# Patient Record
Sex: Male | Born: 1940 | Race: White | Hispanic: No | Smoking: Former smoker
Health system: Southern US, Community
[De-identification: ages and names within clinical notes are randomized; demographics above are authoritative.]

## PROBLEM LIST (undated history)

## (undated) DIAGNOSIS — F329 Major depressive disorder, single episode, unspecified: Secondary | ICD-10-CM

## (undated) DIAGNOSIS — F410 Panic disorder [episodic paroxysmal anxiety] without agoraphobia: Secondary | ICD-10-CM

## (undated) DIAGNOSIS — I4891 Unspecified atrial fibrillation: Secondary | ICD-10-CM

## (undated) DIAGNOSIS — E785 Hyperlipidemia, unspecified: Secondary | ICD-10-CM

## (undated) DIAGNOSIS — K219 Gastro-esophageal reflux disease without esophagitis: Secondary | ICD-10-CM

## (undated) DIAGNOSIS — I5022 Chronic systolic (congestive) heart failure: Secondary | ICD-10-CM

## (undated) DIAGNOSIS — R972 Elevated prostate specific antigen [PSA]: Secondary | ICD-10-CM

## (undated) DIAGNOSIS — C801 Malignant (primary) neoplasm, unspecified: Secondary | ICD-10-CM

## (undated) DIAGNOSIS — I2510 Atherosclerotic heart disease of native coronary artery without angina pectoris: Secondary | ICD-10-CM

## (undated) DIAGNOSIS — C4490 Unspecified malignant neoplasm of skin, unspecified: Secondary | ICD-10-CM

## (undated) DIAGNOSIS — N400 Enlarged prostate without lower urinary tract symptoms: Secondary | ICD-10-CM

## (undated) DIAGNOSIS — I4892 Unspecified atrial flutter: Secondary | ICD-10-CM

## (undated) DIAGNOSIS — I442 Atrioventricular block, complete: Secondary | ICD-10-CM

## (undated) DIAGNOSIS — C679 Malignant neoplasm of bladder, unspecified: Secondary | ICD-10-CM

## (undated) DIAGNOSIS — M797 Fibromyalgia: Secondary | ICD-10-CM

## (undated) DIAGNOSIS — Z950 Presence of cardiac pacemaker: Secondary | ICD-10-CM

## (undated) DIAGNOSIS — K449 Diaphragmatic hernia without obstruction or gangrene: Secondary | ICD-10-CM

## (undated) DIAGNOSIS — F32A Depression, unspecified: Secondary | ICD-10-CM

## (undated) DIAGNOSIS — N281 Cyst of kidney, acquired: Secondary | ICD-10-CM

## (undated) DIAGNOSIS — I10 Essential (primary) hypertension: Secondary | ICD-10-CM

## (undated) DIAGNOSIS — I255 Ischemic cardiomyopathy: Secondary | ICD-10-CM

## (undated) DIAGNOSIS — E871 Hypo-osmolality and hyponatremia: Secondary | ICD-10-CM

## (undated) DIAGNOSIS — F419 Anxiety disorder, unspecified: Secondary | ICD-10-CM

## (undated) DIAGNOSIS — N3289 Other specified disorders of bladder: Secondary | ICD-10-CM

## (undated) HISTORY — DX: Hyperlipidemia, unspecified: E78.5

## (undated) HISTORY — DX: Fibromyalgia: M79.7

## (undated) HISTORY — DX: Unspecified atrial flutter: I48.92

## (undated) HISTORY — DX: Gastro-esophageal reflux disease without esophagitis: K21.9

## (undated) HISTORY — DX: Atrioventricular block, complete: I44.2

## (undated) HISTORY — DX: Malignant neoplasm of bladder, unspecified: C67.9

## (undated) HISTORY — PX: EXCISIONAL HEMORRHOIDECTOMY: SHX1541

## (undated) HISTORY — DX: Atherosclerotic heart disease of native coronary artery without angina pectoris: I25.10

## (undated) HISTORY — PX: SKIN CANCER EXCISION: SHX779

## (undated) HISTORY — DX: Essential (primary) hypertension: I10

## (undated) HISTORY — DX: Malignant (primary) neoplasm, unspecified: C80.1

## (undated) HISTORY — DX: Ischemic cardiomyopathy: I25.5

## (undated) HISTORY — PX: CARDIAC CATHETERIZATION: SHX172

## (undated) HISTORY — DX: Anxiety disorder, unspecified: F41.9

---
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
## 1989-08-27 HISTORY — PX: CORONARY ARTERY BYPASS GRAFT: SHX141

## 1997-10-09 ENCOUNTER — Other Ambulatory Visit: Admission: RE | Admit: 1997-10-09 | Discharge: 1997-10-09 | Payer: Self-pay | Admitting: Cardiology

## 2000-01-10 ENCOUNTER — Encounter: Payer: Self-pay | Admitting: Emergency Medicine

## 2000-01-10 ENCOUNTER — Inpatient Hospital Stay (HOSPITAL_COMMUNITY): Admission: EM | Admit: 2000-01-10 | Discharge: 2000-01-13 | Payer: Self-pay

## 2001-05-30 ENCOUNTER — Encounter: Payer: Self-pay | Admitting: Cardiology

## 2001-05-30 ENCOUNTER — Encounter: Admission: RE | Admit: 2001-05-30 | Discharge: 2001-05-30 | Payer: Self-pay | Admitting: Cardiology

## 2002-02-11 ENCOUNTER — Ambulatory Visit (HOSPITAL_COMMUNITY): Admission: RE | Admit: 2002-02-11 | Discharge: 2002-02-11 | Payer: Self-pay | Admitting: Cardiology

## 2004-03-03 ENCOUNTER — Observation Stay (HOSPITAL_COMMUNITY): Admission: EM | Admit: 2004-03-03 | Discharge: 2004-03-04 | Payer: Self-pay

## 2005-05-02 HISTORY — PX: CHOLECYSTECTOMY: SHX55

## 2005-06-02 HISTORY — PX: CORONARY ARTERY BYPASS GRAFT: SHX141

## 2005-06-15 ENCOUNTER — Inpatient Hospital Stay (HOSPITAL_COMMUNITY): Admission: EM | Admit: 2005-06-15 | Discharge: 2005-06-27 | Payer: Self-pay | Admitting: Emergency Medicine

## 2005-07-02 ENCOUNTER — Emergency Department (HOSPITAL_COMMUNITY): Admission: EM | Admit: 2005-07-02 | Discharge: 2005-07-02 | Payer: Self-pay | Admitting: Emergency Medicine

## 2006-01-19 ENCOUNTER — Ambulatory Visit (HOSPITAL_COMMUNITY): Admission: RE | Admit: 2006-01-19 | Discharge: 2006-01-20 | Payer: Self-pay | Admitting: General Surgery

## 2006-01-19 ENCOUNTER — Encounter (INDEPENDENT_AMBULATORY_CARE_PROVIDER_SITE_OTHER): Payer: Self-pay | Admitting: *Deleted

## 2006-03-20 ENCOUNTER — Inpatient Hospital Stay (HOSPITAL_COMMUNITY): Admission: EM | Admit: 2006-03-20 | Discharge: 2006-03-22 | Payer: Self-pay | Admitting: Emergency Medicine

## 2006-04-04 ENCOUNTER — Inpatient Hospital Stay (HOSPITAL_BASED_OUTPATIENT_CLINIC_OR_DEPARTMENT_OTHER): Admission: RE | Admit: 2006-04-04 | Discharge: 2006-04-04 | Payer: Self-pay | Admitting: Cardiology

## 2007-03-20 ENCOUNTER — Observation Stay (HOSPITAL_COMMUNITY): Admission: EM | Admit: 2007-03-20 | Discharge: 2007-03-22 | Payer: Self-pay | Admitting: *Deleted

## 2007-03-20 ENCOUNTER — Ambulatory Visit: Payer: Self-pay | Admitting: *Deleted

## 2007-07-31 ENCOUNTER — Emergency Department (HOSPITAL_COMMUNITY): Admission: EM | Admit: 2007-07-31 | Discharge: 2007-07-31 | Payer: Self-pay | Admitting: Emergency Medicine

## 2009-10-29 ENCOUNTER — Inpatient Hospital Stay (HOSPITAL_COMMUNITY): Admission: EM | Admit: 2009-10-29 | Discharge: 2009-10-31 | Payer: Self-pay | Admitting: Emergency Medicine

## 2009-10-29 ENCOUNTER — Ambulatory Visit: Payer: Self-pay | Admitting: Internal Medicine

## 2009-10-30 HISTORY — PX: CARDIAC CATHETERIZATION: SHX172

## 2010-01-28 ENCOUNTER — Ambulatory Visit: Payer: Self-pay | Admitting: Cardiology

## 2010-05-02 HISTORY — PX: CATARACT EXTRACTION W/ INTRAOCULAR LENS  IMPLANT, BILATERAL: SHX1307

## 2010-05-02 DEATH — deceased

## 2010-05-26 ENCOUNTER — Ambulatory Visit: Payer: Self-pay | Admitting: Cardiology

## 2010-06-21 ENCOUNTER — Observation Stay (HOSPITAL_COMMUNITY)
Admission: EM | Admit: 2010-06-21 | Discharge: 2010-06-23 | DRG: 204 | Disposition: A | Discharge status: Home / Self Care | Payer: Medicare Other | Attending: Cardiovascular Disease | Admitting: Cardiovascular Disease

## 2010-06-21 ENCOUNTER — Emergency Department (HOSPITAL_COMMUNITY): Payer: Medicare Other

## 2010-06-21 DIAGNOSIS — F411 Generalized anxiety disorder: Secondary | ICD-10-CM | POA: Insufficient documentation

## 2010-06-21 DIAGNOSIS — R5381 Other malaise: Secondary | ICD-10-CM | POA: Insufficient documentation

## 2010-06-21 DIAGNOSIS — R079 Chest pain, unspecified: Secondary | ICD-10-CM

## 2010-06-21 DIAGNOSIS — K219 Gastro-esophageal reflux disease without esophagitis: Secondary | ICD-10-CM | POA: Insufficient documentation

## 2010-06-21 DIAGNOSIS — I2510 Atherosclerotic heart disease of native coronary artery without angina pectoris: Secondary | ICD-10-CM | POA: Insufficient documentation

## 2010-06-21 DIAGNOSIS — I10 Essential (primary) hypertension: Secondary | ICD-10-CM | POA: Insufficient documentation

## 2010-06-21 DIAGNOSIS — Z951 Presence of aortocoronary bypass graft: Secondary | ICD-10-CM | POA: Insufficient documentation

## 2010-06-21 DIAGNOSIS — IMO0001 Reserved for inherently not codable concepts without codable children: Secondary | ICD-10-CM | POA: Insufficient documentation

## 2010-06-21 DIAGNOSIS — Z23 Encounter for immunization: Secondary | ICD-10-CM | POA: Insufficient documentation

## 2010-06-21 DIAGNOSIS — E785 Hyperlipidemia, unspecified: Secondary | ICD-10-CM | POA: Insufficient documentation

## 2010-06-21 DIAGNOSIS — I2589 Other forms of chronic ischemic heart disease: Secondary | ICD-10-CM | POA: Insufficient documentation

## 2010-06-21 LAB — CBC
HCT: 39.9 % (ref 39.0–52.0)
Hemoglobin: 13.4 g/dL (ref 13.0–17.0)
MCH: 30.7 pg (ref 26.0–34.0)
MCV: 91.5 fL (ref 78.0–100.0)
RBC: 4.36 MIL/uL (ref 4.22–5.81)
WBC: 8 10*3/uL (ref 4.0–10.5)

## 2010-06-21 LAB — COMPREHENSIVE METABOLIC PANEL
AST: 17 U/L (ref 0–37)
BUN: 10 mg/dL (ref 6–23)
CO2: 29 mEq/L (ref 19–32)
Chloride: 97 mEq/L (ref 96–112)
GFR calc Af Amer: >60 mL/min (ref >60)
Potassium: 5 mEq/L (ref 3.5–5.1)
Sodium: 133 mEq/L — ABNORMAL LOW (ref 135–145)

## 2010-06-21 LAB — CK TOTAL AND CKMB (NOT AT ARMC)
CK, MB: 1.8 ng/mL (ref 0.3–4.0)
Relative Index: INVALID (ref 0.0–2.5)

## 2010-06-21 LAB — POCT CARDIAC MARKERS: Myoglobin, poc: 77 ng/mL (ref 12–200)

## 2010-06-22 LAB — CARDIAC PANEL(CRET KIN+CKTOT+MB+TROPI)
CK, MB: 1.7 ng/mL (ref 0.3–4.0)
Relative Index: INVALID (ref 0.0–2.5)
Total CK: 46 U/L (ref 7–232)

## 2010-06-23 LAB — CBC
HCT: 37.8 % — ABNORMAL LOW (ref 39.0–52.0)
Hemoglobin: 12.6 g/dL — ABNORMAL LOW (ref 13.0–17.0)
MCH: 30.7 pg (ref 26.0–34.0)
MCHC: 33.3 g/dL (ref 30.0–36.0)
RBC: 4.1 MIL/uL — ABNORMAL LOW (ref 4.22–5.81)
RDW: 12.6 % (ref 11.5–15.5)
WBC: 6.2 10*3/uL (ref 4.0–10.5)

## 2010-06-30 NOTE — Discharge Summary (Addendum)
NAME:  [name], [name] NO.:  [id number]  MEDICAL RECORD NO.:  [id number]           PATIENT TYPE:  I  LOCATION:  2034                         FACILITY:  [facility]  PHYSICIAN:  Cassell Clement, M.D. DATE OF BIRTH:  1940/08/10  DATE OF ADMISSION:  06/21/2010 DATE OF DISCHARGE:  06/23/2010                              DISCHARGE SUMMARY   DISCHARGE DIAGNOSES: 1. Chest pain.     a.     Cardiac enzymes negative x4.     b.     Imdur increased to 60 mg daily. 2. Coronary artery disease.     a.     Status post coronary artery bypass graft in 1991 with left      internal mammary artery to the left anterior descending, saphenous      vein graft to the obtuse marginal and circumflex, saphenous vein      graft to the posterior descending artery and posterolateral      artery.     b.     Status post redo coronary artery bypass graft x3 with      saphenous vein graft to the obtuse marginal and sequential      saphenous vein graft to posterior descending artery and      posterolateral artery in February 2007.     c.     Last catheterization July 2011 showing left anterior      descending, circumflex, and right coronary artery totalled with      patent grafts.     d.     Ejection fraction of 45% by catheterization. 3. Hypertension. 4. Hyperlipidemia. 5. Gastroesophageal reflux disease. 6. Anxiety. 7. Fibromyalgia. 8. Status post hernia repair. 9. Status post tonsillectomy.  [name] is a 70 year old gentleman with a past medical history that includes CAD, status post CABG with redo, hypertension, hyperlipidemia, and fibromyalgia, who presented to [facility] ER with complaints of substernal chest pain.  He had taken Maalox prior to EMS arrival without relief, and upon arrival to the ER, he was feeling somewhat better spontaneously.  He was given sublingual nitro x3 and symptoms gradually improved.  However, he felt it was difficult to differentiate between reflux  symptoms, angina, and his fibromyalgia discomfort.  He has chronic chest wall pain.  Because of his history, he was admitted to the hospital for further evaluation.  Cardiac enzymes were cycled, which were negative x4.  Cardiac catheterization was felt to be indicated only if enzymes were negative, which they remained.  His Zocor was decreased to 40 mg daily per CT profile.  He did well overnight and was seen by Dr. Patty Sermons yesterday.  At that point, his IV heparin and IV nitroglycerin was discontinued, and he was started on Imdur 60 mg daily which was an increased dose from his 30 mg daily.  He had been reluctant to start Ranexa secondary to cost.  Dr. Patty Sermons stated that this may be added later if he continues to have symptoms with hospital samples from the office if necessary.  He ambulated without difficulty in the hallway.  He had some chronic soreness in chest, but no  angina.  Today on the day of discharge, the patient is doing well with no recurrent chest pain.  EKG is stable.  Dr. Patty Sermons has seen and examined him today and feels he is stable for discharge on his new Imdur dose. Although the initial admission history and physical state possible Myoview, Dr. Patty Sermons would like to see how the patient does on his increased medical therapy for now with consideration for Ranexa as discussed above.  DISCHARGE LABS:  WBC 6.2, hemoglobin 12.6, hematocrit 37.8, platelet count 183.  Sodium 133, potassium 5, chloride 97, CO2 29, glucose 104, BUN 10, creatinine 0.97.  LFTs within normal limits on June 21, 2010.  Cardiac enzymes negative x4.  STUDIES:  Chest x-ray June 21, 2010, showed no acute disease, post CABG.  DISCHARGE MEDICATIONS: 1. Imdur 60 mg daily. 2. Simvastatin 40 mg nightly. 3. Acetaminophen 500 mg daily as needed. 4. Aspirin 81 mg daily. 5. Benazepril 10 mg daily. 6. Calcium carbonate/vitamin D over the counter 1 tablet daily. 7. Cyclobenzaprine 10 mg  b.i.d. 8. Doxepin 25 mg t.i.d. 9. Durezol 0.05% one drop right eye daily. 10.Fexofenadine 60 mg 1 tablet daily as needed for allergies. 11.Guaifenesin 400 mg t.i.d. for congestion. 12.Metoprolol tartrate 50 mg b.i.d. 13.Multivitamin 1 tablet daily. 14.Nepafenac 0.1% ophthalmic 1 drop right eye 4 times daily. 15.Nitroglycerin sublingual 0.4 mg every 5 minutes as needed 3 doses. 16.Ofloxacin 0.1% ophthalmic 1 drop right eye q.i.d. 17.Omeprazole 20 mg b.i.d. 18.Senna/docusate 8.6/50 mg daily as needed for constipation. 19.Xanax 1 mg t.i.d.  DISPOSITION:  [name] will be discharged in stable condition to home.  He is instructed to increase activity slowly and follow a low- salt, heart-healthy diet.  He will follow up with Dr. Patty Sermons on July 07, 2010, at 9 a.m.  Dr. Patty Sermons would like to try him on the trial of increased Imdur in the meantime, with possible consideration for Ranexa via office samples if he has recurrent problems.  DURATION OF DISCHARGE ENCOUNTER:  Greater than 30 minutes including physician and PA time.     Ronie Spies, P.A.C.   ______________________________ Cassell Clement, M.D.    DD/MEDQ  D:  06/23/2010  T:  06/23/2010  Job:  [id number]  Electronically Signed by Ronie Spies  on 06/30/2010 12:57:28 PM Electronically Signed by Cassell Clement M.D. on 07/03/2010 07:54:57 AM

## 2010-07-01 NOTE — H&P (Signed)
NAME:  [name], [name] NO.:  [id number]  MEDICAL RECORD NO.:  [id number]           PATIENT TYPE:  E  LOCATION:  [facility]                         FACILITY:  [facility]  PHYSICIAN:  [name], M.D. DATE OF BIRTH:  December 06, 1940  DATE OF ADMISSION:  06/21/2010 DATE OF DISCHARGE:                             HISTORY & PHYSICAL   PRIMARY CARE PHYSICIAN AND CARDIOLOGIST:  Brett Clement, MD  CHIEF COMPLAINT:  Chest pain.  HISTORY OF PRESENT ILLNESS:  [name] is a 70 year old male with a history of coronary artery disease.  He was in his usual state of health today and talking to his son.  He was not under any new physical or emotional stress.  He had onset of substernal chest pain after his son left, that he describes as a dullness and a pressure.  It reached to 7- 8/10 and he called EMS.  He took Maalox prior to EMS arrival without relief.  On arrival to the emergency room, he was feeling some better and currently complaining of chest pain as 3/10.  He was given sublingual nitroglycerin x3 and feels that his symptoms are greatly improved.  He admits that it is hard for him to tell the difference between his reflux symptoms and angina, but sometimes he gets chest pain which is relieved by Maalox and he feels that is his reflux.  He noted that his blood pressure and heart rate were higher than usual today with a systolic blood pressure as high as 170 and a heart rate in the 80s and 90s.  He has had no recent exertional symptoms, although he is not exercising as much as usual.  He has some chronic chest wall pain that is still present at 1/10, but that is not the symptoms for which he requested transport to the hospital.  He is much more comfortable now.  PAST MEDICAL HISTORY: 1. Status post aortocoronary bypass surgery in 1991, with LIMA to LAD,     SVG to OM, circumflex, SVG to PDA and PL. 2. Status post aortocoronary bypass surgery in 2007, with SVG to OM  and SVG to PDA and PO (LIMA to LAD patent). 3. Status post cardiac catheterization in July of 2011, showing the     LAD, circumflex, and RCA total with patent grafts. 4. Ischemic cardiomyopathy with an EF of 45% cath. 5. Hypertension. 6. Hyperlipidemia. 7. Gastroesophageal reflux disease. 8. Anxiety. 9. The patient states he has fibromyalgia.  SURGICAL HISTORY:  He is status post cardiac catheterizations as well as bypass surgery x2, hernia repair, and tonsillectomy.  ALLERGIES:  He is allergic or intolerant to SULFA and ULTRAM.  CURRENT MEDICATIONS: 1. Omeprazole 20 mg 2 tabs daily. 2. Simvastatin 80 mg a day. 3. Doxepin 25 mg t.i.d. 4. Xanax 1 mg t.i.d. 5. Lopressor 50 mg b.i.d. 6. Imdur 30 mg a day. 7. Benazepril 10 mg a day. 8. Flexeril 10 mg b.i.d. 9. Stool softener plus laxative daily. 10.Calcium plus D daily. 11.Multivitamin daily. 12.Allegra 60 mg daily p.r.n. 13.Tylenol 500 mg p.r.n. 14.Guaifenesin 400 mg t.i.d. p.r.n. 15.Aspirin 81 mg a day.  SOCIAL HISTORY:  He was in [place] with his wife.  He is retired.  He quit tobacco at the age of 29, and has not smoked since.  He has no history of alcohol or drug abuse.  He tries to walk on the treadmill at least 3- 4 times a week.  He tries to do at least 20 minutes at a time which is a mile.  FAMILY HISTORY:  Both of his parents are deceased.  His mother died with a stroke and his father had an MI.  At least one sibling has coronary artery disease.  REVIEW OF SYSTEMS:  He does feel anxious at times, but does not feel unusually anxious today.  He has some increased stress because of caring for his wife.  He does not feel that stress is worse today than usual. He has chronic arthralgias and chest wall pain on a regular basis.  He has occasional reflux symptoms, but denies melena.  The chest pain is as described above.  Full 14-point review of systems is otherwise negative except as stated in the HPI.  PHYSICAL  EXAMINATION:  VITAL SIGNS:  Temperature is 97.7, blood pressure 139/75, pulse 68, respiratory rate 18, O2 saturation 100% on 2 liters. GENERAL:  He is a well developed, slender white male in no acute distress. HEENT:  Normal with the exception of his right cornea is injected. NECK:  There is no lymphadenopathy, thyromegaly, bruit, or JVD noted. CV:  His heart is regular rate and rhythm with an S1 and S2 and no significant murmur, rub, or gallop is noted.  Distal pulses are intact in all four extremities with no femoral bruits appreciated. LUNGS:  Clear to auscultation bilaterally. SKIN:  No rashes or lesions are noted. ABDOMEN:  Soft and nontender with active bowel sounds. EXTREMITIES:  There is no cyanosis, clubbing, or edema noted. MUSCULOSKELETAL:  There is no joint deformity or effusions and no spine or CVA tenderness. NEUROLOGIC:  He is alert and oriented.  Cranial nerves II-XII grossly intact.  Chest x-ray is pending.  EKG is sinus rhythm, rate 69 with no acute ischemic changes.  LABORATORY VALUES:  Hemoglobin 13.4, hematocrit 39.9, WBCs 8.0, platelets 184.  Sodium 133, potassium 5.0, chloride 97, CO2 of 29, BUN 10, creatinine 0.97, glucose 104.  Point of care markers negative x1.  IMPRESSION:  [name] was seen today by Dr. Elease [name], the patient evaluated and the data reviewed.  He is a 70 year old male with chest pain that was relieved with nitroglycerin and a history of bypass surgery x2.  He will be admitted and cardiac enzymes will be cycled.  He will be started on heparin and nitro IV.  PLAN: 1. Cardiac catheterization is indicated if enzymes are elevated.     Medical therapy otherwise with a stress Myoview as an outpatient     very soon. 2. Decrease simvastatin to 40 mg daily. 3. Consider Ranexa and possibly increased Imdur.     Brett Demark, PA-C   ______________________________ [name], M.D.    RB/MEDQ  D:  06/21/2010  T:  06/22/2010  Job:   [id number]  Electronically Signed by Brett Demark PA-C on 06/28/2010 03:25:26 PM Electronically Signed by Kristeen Miss M.D. on 07/01/2010 06:16:45 PM

## 2010-07-07 ENCOUNTER — Ambulatory Visit (INDEPENDENT_AMBULATORY_CARE_PROVIDER_SITE_OTHER): Payer: Medicare Other | Admitting: Cardiology

## 2010-07-07 DIAGNOSIS — Z79899 Other long term (current) drug therapy: Secondary | ICD-10-CM

## 2010-07-07 DIAGNOSIS — R0602 Shortness of breath: Secondary | ICD-10-CM

## 2010-07-07 DIAGNOSIS — R079 Chest pain, unspecified: Secondary | ICD-10-CM

## 2010-07-18 LAB — COMPREHENSIVE METABOLIC PANEL
AST: 26 U/L (ref 0–37)
Albumin: 4 g/dL (ref 3.5–5.2)
BUN: 8 mg/dL (ref 6–23)
Calcium: 8.8 mg/dL (ref 8.4–10.5)
Creatinine, Ser: 1.03 mg/dL (ref 0.4–1.5)
GFR calc Af Amer: >60 mL/min (ref >60)

## 2010-07-18 LAB — LIPID PANEL
Cholesterol: 147 mg/dL (ref 0–200)
HDL: 42 mg/dL (ref >39)
LDL Cholesterol: 87 mg/dL (ref 0–99)
Total CHOL/HDL Ratio: 3.5 RATIO

## 2010-07-18 LAB — TROPONIN I: Troponin I: 0.01 ng/mL (ref 0.00–0.06)

## 2010-07-18 LAB — BASIC METABOLIC PANEL
BUN: 9 mg/dL (ref 6–23)
Calcium: 8.8 mg/dL (ref 8.4–10.5)
Chloride: 102 mEq/L (ref 96–112)
Chloride: 94 mEq/L — ABNORMAL LOW (ref 96–112)
Creatinine, Ser: 1.11 mg/dL (ref 0.4–1.5)
GFR calc Af Amer: >60 mL/min (ref >60)
Potassium: 3.8 mEq/L (ref 3.5–5.1)
Potassium: 4.3 mEq/L (ref 3.5–5.1)
Potassium: 4.5 mEq/L (ref 3.5–5.1)
Sodium: 128 mEq/L — ABNORMAL LOW (ref 135–145)

## 2010-07-18 LAB — CARDIAC PANEL(CRET KIN+CKTOT+MB+TROPI)
CK, MB: 6.3 ng/mL (ref 0.3–4.0)
CK, MB: 6.6 ng/mL (ref 0.3–4.0)
Relative Index: 4.6 — ABNORMAL HIGH (ref 0.0–2.5)
Total CK: 131 U/L (ref 7–232)
Troponin I: 0.02 ng/mL (ref 0.00–0.06)

## 2010-07-18 LAB — CBC
HCT: 39.4 % (ref 39.0–52.0)
Hemoglobin: 13.5 g/dL (ref 13.0–17.0)
MCH: 31.8 pg (ref 26.0–34.0)
MCV: 94.4 fL (ref 78.0–100.0)
MCV: 95.6 fL (ref 78.0–100.0)
MCV: 95.5 fL (ref 78.0–100.0)
Platelets: 166 10*3/uL (ref 150–400)
Platelets: 167 10*3/uL (ref 150–400)
Platelets: 172 10*3/uL (ref 150–400)
RBC: 4.19 MIL/uL — ABNORMAL LOW (ref 4.22–5.81)
RBC: 4.68 MIL/uL (ref 4.22–5.81)
RBC: 4.13 MIL/uL — ABNORMAL LOW (ref 4.22–5.81)
RDW: 12.3 % (ref 11.5–15.5)
WBC: 6.1 10*3/uL (ref 4.0–10.5)
WBC: 7.5 10*3/uL (ref 4.0–10.5)
WBC: 8.2 10*3/uL (ref 4.0–10.5)

## 2010-07-18 LAB — MAGNESIUM: Magnesium: 2.2 mg/dL (ref 1.5–2.5)

## 2010-07-18 LAB — CK TOTAL AND CKMB (NOT AT ARMC)
CK, MB: 2.3 ng/mL (ref 0.3–4.0)
Relative Index: INVALID (ref 0.0–2.5)
Total CK: 96 U/L (ref 7–232)

## 2010-07-18 LAB — DIFFERENTIAL
Eosinophils Relative: 1 % (ref 0–5)
Lymphocytes Relative: 20 % (ref 12–46)
Lymphs Abs: 1.6 10*3/uL (ref 0.7–4.0)
Monocytes Absolute: 0.4 10*3/uL (ref 0.1–1.0)
Monocytes Relative: 5 % (ref 3–12)

## 2010-07-18 LAB — POCT CARDIAC MARKERS
CKMB, poc: <1 ng/mL — ABNORMAL LOW (ref 1.0–8.0)
Troponin i, poc: <0.05 ng/mL (ref 0.00–0.09)

## 2010-07-18 LAB — HEMOGLOBIN A1C: Mean Plasma Glucose: 126 mg/dL — ABNORMAL HIGH (ref <117)

## 2010-08-27 ENCOUNTER — Telehealth: Payer: Self-pay | Admitting: Cardiology

## 2010-08-27 NOTE — Telephone Encounter (Signed)
Having a colonoscopy on 09/13/10 and wants to make sure with Dr.Brackbill that his heart is OK to do it.

## 2010-08-27 NOTE — Telephone Encounter (Signed)
Is this ok?

## 2010-08-29 NOTE — Telephone Encounter (Signed)
Yes, okay to have colonoscopy.

## 2010-08-30 NOTE — Telephone Encounter (Signed)
Called patient and advised.  Patient also c/o a choking sensation and has discussed with his GI doctors nurse.  They may do an endoscopy and colonoscopy on the same day.  Advised ok, verbally given to me by Dr. Tally Due.

## 2010-08-31 ENCOUNTER — Other Ambulatory Visit: Payer: Self-pay | Admitting: *Deleted

## 2010-08-31 DIAGNOSIS — E7800 Pure hypercholesterolemia, unspecified: Secondary | ICD-10-CM

## 2010-09-14 NOTE — Discharge Summary (Signed)
NAME:  [name], [name] NO.:  [id number]   MEDICAL RECORD NO.:  [id number]          PATIENT TYPE:  INP   LOCATION:  4737                         FACILITY:  [facility]   PHYSICIAN:  Cassell Clement, M.D. DATE OF BIRTH:  [birth year]   DATE OF ADMISSION:  03/20/2007  DATE OF DISCHARGE:  03/22/2007                               DISCHARGE SUMMARY   FINAL DIAGNOSES:  1. Chest pain, myocardial infarction ruled out.  2. Status post coronary artery bypass grafting.  3. Hypertensive cardiovascular disease.  4. Anxiety.  5. Dyslipidemia.   OPERATIONS PERFORMED:  A 2-D echocardiogram.   HISTORY:  This 70 year old married Caucasian gentleman with known  coronary artery disease status post CABG and redo CABG most recently in  February 2008 who also has a history of hypertension and dyslipidemia,  GERD and anxiety, presented to the emergency room with acute onset of  substernal chest pain which began after he was exercising this evening.  He tried some sublingual nitroglycerin without relief.  He did notice  some shortness of breath and nausea.  He has had a lot of increased  stress at home because his wife was recently hospitalized with a stroke  and has health needs and he is now the caregiver for her.  The patient  was given morphine, aspirin and IV nitroglycerin drip on arrival and at  the time of admission was chest-pain free.  His physical exam on  admission showed stable vital signs.  The blood pressure was 120/80.  The chest was clear.  The heart revealed no gallop.  Abdomen negative,  extremities negative.   HOSPITAL COURSE:  The patient was admitted to the CCU.  Serial enzymes  were cycled and showed no evidence of myocardial damage.  The day after  admission the patient underwent an echocardiogram which showed mild  depression of LV systolic function with an ejection fraction of 40-45%  and no segmental wall motion abnormalities.  The patient's activity was  rapidly  increased.  He was allowed to walk in the hall.  He had no  further chest pain.  EKGs remained stable and he was able to be  discharged home improved on March 22, 2007.   DISCHARGE MEDICINES:  1. Ecotrin 81 mg daily.  2. Metoprolol 50 mg twice a day.  3. Zocor 80 mg daily.  4. Colace stool softener for bowels as necessary.  5. Alprazolam 1 mg three times a day.  6. Prilosec 20 mg twice a day.  7. Benazepril 10 mg each night.  8. Nitrostat 1/150 p.r.n. sublingually.  9. Imdur 30 mg generic each morning.  10.Doxepin 25 mg the morning, 50 mg in the evening.  11.Allegra 60 mg p.r.n.   The patient will be rechecked in the office at his regularly scheduled  visit on December 3 or sooner p.r.n.   CONDITION ON DISCHARGE:  Improved.           ______________________________  Cassell Clement, M.D.     TB/MEDQ  D:  03/22/2007  T:  03/22/2007  Job:  [id number]

## 2010-09-14 NOTE — H&P (Signed)
NAME:  JARON, CZARNECKI NO.:  [id number]   MEDICAL RECORD NO.:  [id number]           PATIENT TYPE:   LOCATION:                                 FACILITY:   PHYSICIAN:  Elmore Guise., M.D.DATE OF BIRTH:  02-08-1941   DATE OF ADMISSION:  03/20/2007  DATE OF DISCHARGE:                              HISTORY & PHYSICAL   PRIMARY CARDIOLOGIST:  Cassell Clement, M.D.   REASON FOR ADMISSION:  Chest pain.   HISTORY OF PRESENT ILLNESS:  Mr. Swoboda is a very pleasant 70 year old  white male with past medical history of coronary artery disease (status  post coronary artery bypass grafting x2, initial bypass in early 1990s  with repeat in 2007), hypertension, dyslipidemia, gastroesophageal  reflux disease, and anxiety who presents with acute onset of substernal  chest pain.  The patient stated these symptoms started after exercising  this evening.  He started having left-sided pressure associated with  shortness of breath and nausea.  He tried a sublingual nitroglycerin and  aspirin without relief.  Symptoms continued.  He called the office for  instructions.  He was then told to come to the emergency department.  He  denies any problems during his exercise.  He does report he has had  increased stress at home with malaise and fatigue secondary to his wife  with a recent stroke within the last month.  He denies any fever or  cough.  No orthopnea or PND.  No palpitations or lower extremity edema.  His weight is stable for him.  On arrival, he was given morphine,  aspiring, and nitroglycerin and now is chest pain free.   REVIEW OF SYSTEMS:  As per HPI. All others were negative.   CURRENT MEDICATIONS:  1. Prilosec.  2. Xanax.  3. Lotensin 10 mg daily.  4. Doxepin 25 mg in the morning, 50 mg at night.  5. Colace 100 mg daily.  6. Simvastatin 80 mg daily.  7. Imdur 30 mg daily.  8. Metoprolol 50 mg in the morning, 25 mg at night.   ALLERGIES:  SULFA.   FAMILY  HISTORY:  Positive for heart disease with his father.   SOCIAL HISTORY:  He is married.  No tobacco or alcohol.  He is very  active, exercising up to 30 minutes daily walking on his treadmill at  3.1 miles per hour.   PHYSICAL EXAMINATION:  VITAL SIGNS:  He is afebrile.  Blood pressure  120/80, heart rate 80s.  He is showing sinus rhythm on telemetry  monitor.  Saturations 100% on room air.  GENERAL:  He is a very pleasant middle-age white male, alert and  oriented x4, no acute distress.  HEENT:  Normal.  NECK:  Supple, no lymphadenopathy, 2+ carotids.  No JVD, no bruits.  LUNGS:  Clear.  HEART:  Regular with normal S1, S2.  No rub noted.  ABDOMEN:  Soft, nontender, nondistended.  No rebound or guarding.  EXTREMITIES: Warm with 1+ pedal pulses and no edema.  NEUROLOGIC:  Strength is 5/5 and equal bilaterally.  No focal deficits.   Chest x-ray shows  no acute cardiopulmonary disease.   BUN 12, creatinine 1.3, potassium 5.0.  Myoglobin is 90, 66, and 65.  MB  less than 1.0 x3, and troponin I is 0.05 x3.  His white count is 6.9,  hemoglobin 13.7, platelet count 229.   ECG shows normal sinus rhythm, first degree AV block with old inferior  MI and nonspecific ST-T wave changes.  Old tracings are unavailable at  the time of dictation.   IMPRESSION:  1. Chest pain with atypical and typical qualities, currently chest      pain free, and enzymes are negative.  2. History of coronary artery disease status post repeat coronary      artery bypass grafting in 2007.  3. Hypertension.  4. Dyslipidemia.   The patient's last catheterization was back on April 04, 2006.  This  showed severe three-vessel obstructive native disease with patent LIMA  to LAD, patent vein graft to OM-1, patent vein graft to PDA and  posterolateral.  The circumflex filled the right-to-left collaterals.  He had mild LV dysfunction with an EF of 45-50%.  At that time, it was  determined that he should continued with  medical therapy.   PLAN:  1. The patient will be admitted for rule out MI.  He will have serial      cardiac enzymes performed.  2. We will continue nitroglycerin, beta blocker, aspirin, statin      and ACE inhibitor.  3. We will hold Lovenox for now since he is chest pain free, and his      cardiac markers are negative.  4. He has had no EKG changes at this time.  We will start with      noninvasive evaluation with echocardiogram in the morning but with      possible stress per Dr. Patty Sermons regarding whether this should be      inpatient or outpatient.   All of this was discussed with him and his daughter at length.      Elmore Guise., M.D.  Electronically Signed     TWK/MEDQ  D:  03/20/2007  T:  03/20/2007  Job:  [id number]   cc:   Cassell Clement, M.D.

## 2010-09-14 NOTE — H&P (Signed)
[facility] ADMISSION   [name], [name]                       MRN:          [id number]  DATE:10/30/2009                            DOB:          November 14, 1940    PRIMARY CARDIOLOGIST:  Cassell Clement, MD   CHIEF COMPLAINT:  Chest pain.   The patient is a very pleasant 70 year old white male with history of  coronary artery disease status post CABG (x2 in 1990 and 2000),  hypertension, hyperlipidemia, gastroesophageal reflux disease, and  anxiety, presents with the onset of substernal chest pain approximately  1 hour after getting a treadmill about 4:30 p.m.  He notes this is  unrelieved with Maalox or subsequently 2 nitroglycerin, subsequently he  called 911 as instructed.  He did receive additional nitroglycerin  sublingual on the EMS, and had mild resolution of his pain.  He does  have some continued soft, dull pain approximately 2/10.  At its peak, it  was approximately 9/10, and had associated radiation to his left arm.  He notes he has had decreased activity in the last several weeks  associated with a musculoskeletal groin pole but prior to that, was  active on the treadmill.  He denies any symptoms of congestive heart  failure including PND, orthopnea, or lower extremity edema.  He reports  compliance with medications.  He has had no problems with bleeding and  has otherwise remained active.  He does aggressively and regularly check  his blood pressure.   PAST MEDICAL HISTORY:  1. Coronary artery disease status post coronary artery bypass      grafting.  2. Hypertension.  3. Hyperlipidemia.  4. Gastroesophageal reflux disease.  5. Anxiety.   CURRENT MEDICATIONS:  1. Aspirin 81 mg p.o. daily.  2. Metoprolol 50 mg p.o. b.i.d.  3. Zocor 80 mg p.o. daily.  4. Colace plus laxative p.r.n. daily.  5. Alprazolam 1 mg t.i.d.  6. Prilosec 20 mg b.i.d.  7. Benazepril 10 mg daily.  8. Sublingual nitro  p.r.n.  9. Imdur 30 mg p.o. q.a.m.  10.Doxepin 25 mg in the morning, 50 mg q.h.s.  11.Allegra 60 mg p.r.n.  12.Calcium supplements t.i.d.   ALLERGIES:  Includes SULFA.   FAMILY HISTORY:  Notes father with late onset coronary artery disease.   SOCIAL HISTORY:  No tobacco, alcohol, or drug use.  He is married.  He  is very active, walking 20-30 minutes on his treadmill, about 2.5 miles  per hour.   REVIEW OF SYMPTOMS:  A full 14-point review of systems were negative  except as noted above in the HPI.  He is a full code.   OBJECTIVE DATA:  VITAL SIGNS:  Temperature is 98.1, pulse 61-64,  respiratory rate 16, blood pressure 124 to 136 over 74 to 80.  GENERAL:  Well-appearing white male, in no acute distress.  HEENT:  Normocephalic, atraumatic.  Pupils equal, round, and reactive to  light and accommodation.  Extraocular movements intact.  Wearing  corrective lenses.  Sclerae are anicteric.  NECK:  Supple, approximately 3 cm of JVD, 2+ carotids.  No bruits,  lymphadenopathy.  No cervical lymphadenopathy was noted.  CARDIOVASCULAR:  Regular rate and rhythm.  No soft S1, soft most 1/6  across the upper sternal border systolic murmur.  LUNGS:  Clear to auscultation bilaterally.  SKIN:  Without rash.  ABDOMEN:  Soft, nontender, nondistended.  Normoactive bowel sounds.  No  rebound, no guarding.  EXTREMITIES:  No clubbing, cyanosis, or edema.  MUSCULOSKELETAL:  Normal tone and bulk throughout.  No joint deformity.  NEUROLOGIC:  Strength is 5/5 x all 4 extremities.  No focal deficits.  Cranial nerves were grossly intact.   Chest x-ray reveals no acute cardiopulmonary disease.   LABORATORY DATA:  Reveals white count of 8.2, hemoglobin of 13.5,  platelet count of 474.  Sodium is low at 128, potassium is 4.5, chloride  is 94, bicarb is 30, BUN is 9, creatinine is 1.09, glucose is 96.  Initial troponin at 8:12 reveal a troponin less than 0.05 with normal CK  MB fraction.  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   IMPRESSION:  The patient is a 70 year old white male with known coronary  disease status post coronary bypass grafting x2, gastroesophageal reflux  disease, hypertension, hyperlipidemia, presenting with chest pain,  concerning for progression and unstable angina.  We will treat this as  empiric acute coronary syndrome.  His initial enzymes and EKG are  unremarkable and his pain does have some atypical features.  He is  presently chest pain free.   1. We will admit him to a telemetry bed and rule him out for      myocardial infarction with serial cardiac enzymes.  He was given      Lovenox subcu treatment dose x1.  We will continue with his home      aspirin, beta-blocker, ACE inhibitor, as well as a statin.  We will      continue with long-acting nitroglycerin.  2. We will hold a.m. doses of Lovenox.  3. We will consider left heart catheterization in the morning.  He      does have a history of coronary artery bypass grafting as above.      His last catheterization was in December 2007, at which time a 3-      vessel obstructive native disease with a patent LIMA to his LAD      graft with OM-1, a patent graft to his PDA and posterolateral.  The      circumflex territory filled with right to left collaterals.  He was      deemed appropriate for medical therapy at that time.  4. Anxiety.  We will continue with his baseline benzodiazepine.  We      did discuss potentially switching to a longer acting agent as an      outpatient.  Fluid, electrolyte, nutrition n.p.o. after midnight      for potential left heart catheterization.  We will continue with      his routine medications.  5. Hyperlipidemia.  We will check his fasting lipid profile, continue      his empiric statin.  6. Hypertension.  We will continue with his beta-blocker, ACE      inhibitor, long-acting nitrate as above.  7. Prophylaxis.  Lovenox  treatment dose given as above.     Vinnie Level, MD    PMB/MedQ  DD: 10/30/2009  DT: 10/30/2009  Job #: [id number]   cc:   Cassell Clement,  M.D. 

## 2010-09-17 NOTE — Op Note (Signed)
NAME:  [name], FURUYA NO.:  [id number]   MEDICAL RECORD NO.:  [id number]          PATIENT TYPE:  INP   LOCATION:  2019                         FACILITY:  [facility]   PHYSICIAN:  Graylin Shiver, M.D.   DATE OF BIRTH:  [birth year]   DATE OF PROCEDURE:  03/21/2006  DATE OF DISCHARGE:                                 OPERATIVE REPORT   INDICATIONS FOR PROCEDURE:  The patient is a 70 year old male who we are  endoscoping because of history of chest pain.  It was not felt to be cardiac  in nature.  The patient also describes a choking sensation at times when he  swallows.  He has undergone multiple endoscopies with dilation in the past  by his gastroenterologist in [place] [facility].  We  did obtain a report from a recent endoscopy with [facility] dilation done by  Dr. Rolin Barry in [place] in June of this year.  The impression of  that was that he had a small hiatal hernia.  There was no mention of a  esophageal stricture.  He is undergoing repeat endoscopy at this time with  possible dilatation if a stricture is found.   Informed consent was obtained after explanation of the risks of bleeding,  infection and perforation.   PREMEDICATION:  Fentanyl 100 mcg IV and Versed 10 mg IV.   PROCEDURE:  With the patient in the left lateral decubitus position, the  Olympus gastroscope was inserted into the oropharynx and passed into the  esophagus.  It was advanced down the esophagus then into the stomach and  into the duodenum.  The second portion and bulb of the duodenum looked  normal.  The stomach looked normal, although there was a little mild  prepyloric erythema.  No abnormalities were seen upon retroflexion of the  gastroscope.  There were no fundal or cardia lesions in the stomach.  The  scope was then straightened and brought back into the esophagus.  The  esophagogastric junction was at 39 cm.  I did not appreciate a hiatal  hernia.  The distal,  mid and proximal esophagus all looked normal.  I saw no  evidence of a stricture or narrowing.  He tolerated the procedure well  without complications.  I saw nothing on this examination to dilate.   IMPRESSION:  Essentially normal upper endoscopy.           ______________________________  Graylin Shiver, M.D.     SFG/MEDQ  D:  03/21/2006  T:  03/21/2006  Job:  [id number]   cc:   Cassell Clement, M.D.

## 2010-09-17 NOTE — Consult Note (Signed)
NAME:  [name] NO.:  [id number]   MEDICAL RECORD NO.:  [id number]          PATIENT TYPE:  INP   LOCATION:  4728                         FACILITY:  [facility]   PHYSICIAN:  Salvatore Decent. Dorris Fetch, M.D.DATE OF BIRTH:  06-05-1940   DATE OF CONSULTATION:  06/17/2005  DATE OF DISCHARGE:                                   CONSULTATION   REASON FOR CONSULTATION:  Severe native three vessel coronary artery  disease, question redo coronary bypass grafting.   HISTORY OF PRESENT ILLNESS:  [name] is a 70 year old gentleman who was  admitted on June 15, 2005, with a chief complaint of chest pain.  The  patient has a history of coronary artery disease dating back almost 20  years.  In 1991, he had bypass.  He did well after that time but in 2003,  had increasing chest pain.  At that time, showed severe native three vessel  disease and left main disease.  Both saphenous vein grafts were occluded and  his left mammary to LAD graft was widely patent.  He was treated medically  and had done well over the next four years, however, recently, he has had  return of his chest discomfort.  He would occasionally have some mild  anginal symptoms throughout that time, described as a tightness not  associated with shortness of breath or diaphoresis, but chills on his  episode that lead to his hospitalization.  He had had a stress test on  February 13 at Dr. Yevonne Pax office and that showed a large inferolateral  scar with a small area of reversibility.  His ejection fraction was 37%.  On  admission, he was treated with IV nitroglycerin.  He ruled out for  myocardial infarction.  His EKG did not show significant changes.  Since  admission, he has continued to have a slight what he has described as a dull  soreness.  Yesterday, he underwent cardiac catheterization which showed  severe native three vessel disease with good collateralization to the obtuse  marginal and distal right  coronary.  His left ventriculogram showed inferior  akinesis and ejection fraction of 35-40%.  This did not appear to be  significantly different from his catheterization in 2003 on direct  comparison.   PAST MEDICAL HISTORY:  Significant for coronary artery disease with previous  coronary bypass grafting, hypertension, dyslipidemia, anxiety, allergies,  and constipation.   MEDICATIONS PRIOR TO ADMISSION:  Prilosec 40 mg daily, Xanax 0.5 mg t.i.d.,  Lipitor 40 mg daily, Plavix 75 mg daily, Lotensin 10 mg daily, Doxepin 25 mg  t.i.d., aspirin 81 mg daily, Toprol XL 100 mg daily, Foltx one tablet daily,  Allegra D 60 mg p.r.n., Imdur 60 mg q.a.m. and 30 mg q.p.m., Flonase 1-2  sprays to each nostril daily p.r.n., sublingual nitroglycerin.   ALLERGIES:  He has an adverse reaction to Ultram which causes confusion.   FAMILY HISTORY:  Significant for coronary artery disease.   SOCIAL HISTORY:  He has been on disability due to his heart disease.  He  does not use alcohol or tobacco.  He lives with his  wife.   REVIEW OF SYMPTOMS:  Fibromyalgia, he does have difficulty falling asleep,  he does have reflux symptoms.  He wears glasses, he has dentures.  All other  systems are negative.   PHYSICAL EXAMINATION:  GENERAL:  [name] is a well appearing 70 year old gentleman in no acute  distress, well developed, well nourished.  VITAL SIGNS:  Blood pressure 130/70, pulse 70, respirations 16.  NEUROLOGICAL:  Alert and oriented x 3, appropriate, grossly intact.  HEENT:  Unremarkable.  He does have dentures and glasses.  NECK:  Supple without thyromegaly, adenopathy, or bruits.  CARDIAC:  Regular rate and rhythm, normal S1 and S2, no murmurs, gallops,  and rubs.  He has a well healed midline sternotomy incision.  LUNGS:  Clear and equal breath sounds bilaterally.  ABDOMEN:  Soft, nontender.  EXTREMITIES:  No cyanosis, clubbing, and edema.  He has had a saphenous  venectomy from his right  ankle to mid thigh.  He does have diminished  peripheral pulses.   LABORATORY DATA:  Chest x-ray shows no active disease, he does have  cardiomegaly.  His cardiac enzymes were negative.  Sodium 134, potassium  4.6, BUN 8 and creatinine 1.1.  Protime 13.4, PTT 27.  White count 6,  platelets 232, hematocrit 38.  Cholesterol 136 with HDL 36, triglycerides  123.   IMPRESSION:  [name] is a 70 year old gentleman who presents with  likely unstable anginal symptoms.  He has a history of anxiety disorder.  There is a possibility that anxiety may be playing some roll in this  although he did have symptoms and they correlated with an abnormal stress  test.  He, no doubt, has severe left main and native three vessel coronary  artery disease and is receiving very little blood supply via his native  coronaries and the sole blood supply to his heart essentially at the present  time is a large patent mammary to his LAD of the right coronary artery and  the OM branches of the circumflex.  By catheterization, his anatomy is not  significantly different from the anatomy in 2003 and it is hard to explain  from the catheterization the recent change in his symptoms, could be due to  compromised collaterals from the LAD.   I had a long discussion with Mr. Cardinal' brother and sister-in-law who  were present at the time, his wife is not available.  We discussed the  indications, risks, benefits, expectations and alternatives with redo bypass  grafting.  I do not think in this case that redo bypass would provide any  survival benefit given that he has a large mammary patent to the LAD and  relatively good collaterals and has had a previous MI in the right coronary  distribution already.  Therefore, revascularization would be solely for  symptom relief.  I did discuss with him the risks of surgery which include but are not limited to death, stroke, MI, DVT, PE, bleeding, possible need  for transfusions,  infection, as well as other organ dysfunction including  respiratory, renal, or GI complications.  He also understands that there is  a significant possibility of incomplete revascularization, in fact, I would  say there is an absolute chance of him having incomplete revascularization  based on his anatomy.  I estimated that there is an 85% chance of relief  from his anginal symptoms.  There is also a possibility, albeit small, of  injury to the left mammary graft in which case his long term  survival would  probably be less likely than it is presently.   Unfortunately with [name], he is already on an excellent medical  regimen and there is really not a lot of room to maneuver in terms of his  medications.  Therefore, we may be forced to proceed with grafting for  relief of symptoms.  If he does decide to go ahead with redo bypass  grafting, we will plan surgery next Tuesday, February 20.           ______________________________  Salvatore Decent. Dorris Fetch, M.D.     SCH/MEDQ  D:  06/17/2005  T:  06/17/2005  Job:  [id number]   cc:   Cassell Clement, M.D.  Fax: [phone]   Ernestina Penna, M.D.  Fax: [phone]

## 2010-09-17 NOTE — Discharge Summary (Signed)
NAME:  [name], [name] NO.:  [id number]   MEDICAL RECORD NO.:  [id number]          PATIENT TYPE:  INP   LOCATION:  2019                         FACILITY:  [facility]   PHYSICIAN:  Cassell Clement, M.D. DATE OF BIRTH:  March 29, 1941   DATE OF ADMISSION:  03/18/2006  DATE OF DISCHARGE:  03/22/2006                                 DISCHARGE SUMMARY   FINAL DIAGNOSES:  1. Chest pain.  2. Known arteriosclerotic cardiovascular disease with bypass graft surgery      in 1991 and in 2007.  3. Gastroesophageal reflux disease.  4. Chronic anxiety syndrome.  5. Dyslipidemia.  6. Allergic rhinitis.  7. Hypertension.   OPERATIONS PERFORMED:  Esophagogastroduodenoscopy on March 21, 2006, by  Dr. Evette Cristal.   HISTORY:  This 70 year old married Caucasian gentleman was admitted on  March 18, 2006, by Dr. Corliss Marcus, because of chest pain.  He has a  long history of arteriosclerotic disease.  He underwent coronary artery  graft surgery in 1991 and had a redo procedure by Dr. Andrey Spearman in  February of 2007.  On the day of admission, he developed substernal chest  discomfort at the end of his walk.  It did not radiate, was central in  nature, was associated with mild dyspnea, but no diaphoresis or nausea.  He  returned home and rested, but the discomfort persisted and he called EMS and  was transported to [facility], where he was given several doses of  sublingual nitroglycerin, which did not seem to help much, and finally was  given IV morphine 2 mg and the discomfort resolved.  At the time he was seen  several hours later by Dr. Amil Amen, he was still having what he described as  a choking sensation in the upper sternal region.  Dr. Amil Amen noted that the  patient did have a long history of GERD, as well as a history of anxiety.   PHYSICAL EXAMINATION:  On admission, showed normal vital signs.  The lungs  were clear.  The heart revealed no murmur, gallop, rub, or  click.  ABDOMEN:  Negative.  EXTREMITIES:  Negative.  Pedal pulses were good.   HOSPITAL COURSE:  The patient had initial CK-MB, troponin, and point-of-care  enzymes, which were negative x2.  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  The patient was admitted to telemetry.  Serial cardiac enzymes  were obtained and were negative.  The patient was placed on Lovenox and was  continued on his home cardiac meds.  Aspirin was also continued and a GI  cocktail was also tried.  The patient continued to have some evidences of  substernal burning discomfort.  The patient was concerned that some of his  discomfort might be related to his GERD and was concerned that his  esophageal stricture, which had required dilatation several times in the  past, might be recurring, and for this reason, we obtained a  gastroenterology consultation with Dr. Evette Cristal.  Dr. Evette Cristal, on November 20,  took the patient to the endoscopy  suite and performed  esophagogastroduodenoscopy.  He found a normal esophagus and a normal  stomach with some mild prepyloric erythema and a normal duodenum and there  was no evidence of any stricture seen.  It was felt that some of his  symptoms were related to anxiety and his Xanax was increased from twice a  day to four times a day with improvement.  By November 21, the patient was  stable enough to be discharged home.   ACCESSORY LABORATORY STUDIES:  Hemoglobin 13, hematocrit 38, white count  5700.  Sodium 134, potassium 4.7, BUN 11, blood sugar 136, nonfasting.  Liver function studies were normal with an albumin of 3.4.  Serial CK-MBs  and troponin-I's were normal.  His cholesterol was 135, LDL 82, HDL 38,  triglycerides 76.  His electrocardiogram showed normal sinus rhythm with an  old inferior wall MI.  His chest x-ray showed heart size at the upper limits  of normal.  Lungs were clear and there were no  acute findings.   The patient improved to the point that he was walking in the hall without  symptoms and was stable for discharge on March 22, 2006.  He will be  followed up at his regular office visit in December with Dr. Patty Sermons.  He  will be on a low-cholesterol diet.  He is to walk daily for exercise.  He  will be walking a mile twice a day, rather than two miles all at once.   DISCHARGE MEDICATIONS:  1. Aspirin 81 mg daily.  2. Generic Allegra 60 mg twice a day.  3. Stool softener daily.  4. Generic Imdur 30 mg daily.  5. Metoprolol 25 mg twice a day.  6. Benazepril 10 mg daily.  7. Omeprazole 20 mg twice a day.  8. Doxepin 25 mg taking one in the morning and two at night.  9. Xanax 1 mg four times a day.  10.Simvastatin 80 mg daily.  11.Maalox p.r.n.  12.Nitrostat 1/150 sublingually p.r.n.   The patient is to call if he has any further questions or concerns.   CONDITION ON DISCHARGE:  Improved.           ______________________________  Cassell Clement, M.D.     TB/MEDQ  D:  03/22/2006  T:  03/22/2006  Job:  [id number]   cc:   Graylin Shiver, M.D.

## 2010-09-17 NOTE — Cardiovascular Report (Signed)
NAME:  [name], [name] NO.:  [id number]   MEDICAL RECORD NO.:  [id number]          PATIENT TYPE:  OIB   LOCATION:  1962                         FACILITY:  [facility]   PHYSICIAN:  Peter M. Swazil[place], M.D.  DATE OF BIRTH:  1940/10/02   DATE OF PROCEDURE:  04/04/2006  DATE OF DISCHARGE:  04/04/2006                            CARDIAC CATHETERIZATION   INDICATIONS FOR PROCEDURE:  A 70 year old white male status post redo  coronary bypass surgery in February 2007 presents with recurrent chest  pain symptoms.  Recent GI evaluation has been unremarkable.   PROCEDURE NOTE:  His left heart catheterization, coronary and left  ventricular angiography, saphenous vein graft angiography x2 and LIMA  graft angiography.   EQUIPMENT USED:  A 4-French 4 cm right left Judkins catheter, 4-French  IMA catheter, 4-French left bypass catheter, 4-French pigtail catheter,  4-French arterial sheath.   MEDICATIONS:  Local anesthesia 1% Xylocaine, contrast 120 mL of  Omnipaque.   HEMODYNAMIC DATA:  Aortic pressure was 139/73 with mean of 99 mmHg.  Left ventricle pressure is 146 with EDP of 23 mmHg.   ANGIOGRAPHIC DATA:  Left coronary arises normally.  The left main  coronary has a 90% stenosis proximally.   Left anterior ascending artery is occluded proximally.   The left circumflex coronary is occluded proximally.   The right coronary is occluded proximally.   There is a saphenous vein graft to the posterior descending artery and  posterolateral branches of the right coronary.  This graft is widely  patent with excellent runoff.  It also fills the mid to distal  circumflex coronary by right to left collaterals.   Saphenous vein graft to the first obtuse marginal vessel is patent.  It  is difficult to engage, but clearly on flush shots widely patent with  widely patent with good runoff.   The LIMA graft to the LAD is widely patent with excellent runoff.   Left ventricular angiography  was performed in RAO view.  This  demonstrates severe inferior wall hypokinesia with overall mild left  ventricular dysfunction and ejection fraction 45 50%.  There is no  mitral regurgitation or prolapse.   INTERPRETATION:  1. Severe three-vessel obstructive atherosclerotic coronary disease.  2. All grafts are patent including LIMA graft to the LAD, saphenous      vein graft to the first obtuse marginal vessel, saphenous vein      graft to the PDA/posterolateral branches to the right coronary.      The mid to distal      circumflex fills by right to left collaterals.  3. Mild left ventricular dysfunction.   PLAN:  Would recommend continued medical therapy.           ______________________________  Peter M. [place]il[place], M.D.     PMJ/MEDQ  D:  04/04/2006  T:  04/05/2006  Job:  [id number]   cc:   Cassell Clement, M.D.  Graylin Shiver, M.D.

## 2010-09-17 NOTE — Consult Note (Signed)
NAME:  [name], [name] NO.:  [id number]   MEDICAL RECORD NO.:  [id number]          PATIENT TYPE:  INP   LOCATION:  2019                         FACILITY:  [facility]   PHYSICIAN:  Graylin Shiver, M.D.   DATE OF BIRTH:  1940/11/02   DATE OF CONSULTATION:  DATE OF DISCHARGE:                                   CONSULTATION   Consult was requested by Patty Sermons for noncardiac chest pain and dysphagia.   HISTORY OF PRESENT ILLNESS:  This is a 70 year old male with a long cardiac  history and a long gastrointestinal history of esophageal dilatation.  He is  complaining of a long-term choking feeling along with chest pain that  started on Saturday.  The choking symptoms have been worse since September  2007 when he had a cholecystectomy.  He has had a negative cardiac workup.  He says that he has a choking feeling when he eats but still is able to eat  well, solids and liquids.  He is positive for reflux which he controls with  20 mg of Prilosec b.i.d.  Positive for constipation.  Negative for melena,  hematochezia, nausea, vomiting, and diarrhea.  Negative for NSAIDs use  except for an 81-mg aspirin daily.  Negative for tobacco and alcohol.  Negative for abdominal pain.  Negative for weight loss.   Past medical history is significant for multiple esophageal dilatations.  His gastroenterologist is Dr. Adelene Amas. Noelle Penner at [facility], telephone number  [phone].  He has been a patient of Dr. Noelle Penner since 1977 and reports  that he has had 15+ dilatations of his esophagus, the most recent being in  June of 2007.  Past medical history is also significant for hiatal hernia;  GERD;  hypertension; anxiety; psychiatric admissions; hypercholesterolemia;  CABG, 3-vessel in 1991; and CABG, 3-vessel in February 2007; lap chole  September 2007.   Current medications include Lovenox, doxepin, Lopressor, Protonix, Lotensin,  nitroglycerin, Claritin, Ambien, and Xanax.   ALLERGIES:  SULFA  AND ULTRAM.   Family history is significant for his brother who had colon cancer.  There  appears to be no other GI history in the family.   SOCIAL HISTORY:  He lives at home with his wife.  He is retired.  No  tobacco, alcohol, or drug.   PHYSICAL EXAMINATION:  Patient is alert and oriented, in no apparent  distress.  He does seem anxious.  Cardiovascular system has a regular rate and rhythm with no murmurs, rubs,  or gallops.  Lungs are clear to auscultation bilaterally.  His abdominal is soft, nontender, nondistended with decreased bowel sounds.  His neck has no thyromegaly, no adenopathy.   His labs show a white count of 7.6, hemoglobin 13.4, platelets 229,  hematocrit 39.5.  His liver enzymes are normal.  His triglycerides were 76.   Dr. Wandalee Ferdinand has seen and examined the patient.  His assessment is that  this is a 70 year old male with a history of CABG x2.  We are uncertain at  this point what the cause of his choking feeling is, but we plan to do an  EGD with probable dilatation in the morning.   Thank you very much for this consultation.      Stephani Police, PA    ______________________________  Graylin Shiver, M.D.    MLY/MEDQ  D:  03/20/2006  T:  03/21/2006  Job:  [id number]   cc:   Cassell Clement, M.D.  Graylin Shiver, M.D.

## 2010-09-17 NOTE — H&P (Signed)
NAME:  [name], Almond NO.:  [id number]   MEDICAL RECORD NO.:  [id number]                    PATIENT TYPE:   LOCATION:                                       FACILITY:   PHYSICIAN:  W. Ashley Royalty., M.D.         DATE OF BIRTH:  December 04, 1940   DATE OF ADMISSION:  DATE OF DISCHARGE:                                HISTORY & PHYSICAL   REASON FOR ADMISSION:  For catheterization.   HISTORY:  This 70 year old male is admitted for catheterization.  He has a  prior history of coronary artery bypass grafting in 1991 but had continued  chest pain and had a graft to the right coronary artery, it was noted to be  occluded.  He had developed progressive angina and had a stress test by Dr.  Patty Sermons showing some ischemia and was re-admitted for repeat  catheterization.   PAST MEDICAL HISTORY:  History of hyperlipidemia.  History of depression in  the past.  Previous surgery of coronary artery bypass grafting.   ALLERGIES:  To sulfa and to Ultram.   CURRENT MEDICATIONS:  Plavix 75 mg daily; Xanax 0.5 daily; Tagamet 300  q.i.d.; aspirin 81 daily; Lipitor 20 daily; Toprol XL 100 daily; Altace 10  mg daily.   FAMILY HISTORY:  Strongly positive for heart disease.  Father died of MI,  brother had coronary artery bypass grafting, mother died of a stroke.   SOCIAL HISTORY:  Retired, quit smoking at age 44.  Does not use alcohol.  Is  married.   REVIEW OF SYSTEMS:  He has some gastroesophageal reflux.  He has a history  of anxiety and he is on long term antidepressants.  He had to have an  indwelling catheter placed at the time of his previous catheterization.  Other than as noted above, the remainder of the review of systems is  unremarkable.   PHYSICAL EXAMINATION:  GENERAL:  He is a pleasant male, in no acute  distress.  VITAL SIGNS:  Blood pressure is 120/80, pulse 70.  SKIN:  Warm and dry.  ENT:  EOMI, PERRLA, CNS clear, fundi unremarkable.  Pharynx  negative.  NECK:  Supple without masses, JVD, thyromegaly or bruits.  LUNGS:  Clear to auscultation and percussion.  CARDIAC:  Normal S1 and S2, no S3 or murmur.  ABDOMEN:  Soft, nontender, no mass, hepatosplenomegaly or aneurysm.  Femoral  distal pulses are 2+.  NEUROLOGIC:  Normal.   IMPRESSION:  1. Progressive angina.  2. Coronary artery disease with previous coronary artery bypass grafting,     previous occlusion of the vein graft to PD and PL.  3. Hypertension.  4. Hyperlipidemia.  5. Anxiety.   PLAN:  Patient brought in at this time for same day cardiac catheterization.  Procedure discussed with patient, including risks, and he is willing  proceed.  Darden Palmer., M.D.    WST/MEDQ  D:  05/09/2002  T:  05/09/2002  Job:  [id number]   cc:   Cassell Clement, M.D.  [address], [address]  [place]  [place] [address]  Fax: [phone]

## 2010-09-17 NOTE — Op Note (Signed)
NAME:  [name], [name] NO.:  [id number]   MEDICAL RECORD NO.:  [id number]          PATIENT TYPE:  AMB   LOCATION:  DAY                          FACILITY:  [facility]   PHYSICIAN:  Adolph Pollack, M.D.DATE OF BIRTH:  [birth year]   DATE OF PROCEDURE:  01/19/2006  DATE OF DISCHARGE:                                 OPERATIVE REPORT   PREOPERATIVE DIAGNOSIS:  Symptomatic cholelithiasis.   POSTOPERATIVE DIAGNOSIS:  Symptomatic cholelithiasis.   PROCEDURE:  Laparoscopic cholecystectomy with interoperative cholangiogram.   SURGEON:  Adolph Pollack, M.D.   ASSISTANT:  Velora Heckler, M.D.   ANESTHESIA:  General.   INDICATIONS:  This is a 70 year old male who has had biliary colic type pain  and was discovered to have gallstones.  He now presents for elective  laparoscopic cholecystectomy.  We have discussed the procedure and the risks  preoperatively.   TECHNIQUE:  He is seen in the holding area and brought to the operating room  and placed supine on the operating table.  A general anesthetic was  administered.  The hair on the abdominal wall was clipped and the area was  sterilely prepped and draped.  Local anesthesia consisting of Marcaine was  infiltrated in the subumbilical region.  A subumbilical incision was made  through the skin, subcutaneous tissue, fascia and peritoneum entering the  peritoneal cavity under direct vision.  A pursestring suture of 0 Vicryl was  placed around the fascial edges.  A Hassan trocar was introduced into the  peritoneal cavity and pneumoperitoneum was created by insufflation of CO2  gas.   Following this, a laparoscope was introduced.  The patient  was then placed  in the reverse Trendelenburg position with the right side tilted slightly  up.  An incision was made in the epigastrium and the 11 mm trocar placed  through this incision into the peritoneal cavity.  Two 5 mm trocars were  placed in the peritoneal cavity through  right upper quadrant incisions.  The  fundus of the gallbladder was grasped and retracted toward the right  shoulder.  The infundibulum was grasped and using careful blunt dissection,  staying on the gallbladder, I mobilized the infundibulum.  I then isolated  the cystic duct and created a window around it.  Likewise, I isolated the  cystic artery and created a window around it.  A clip was placed at the  cystic duct gallbladder junction.  A small incision was made in the cystic  duct.  A cholangiocatheter was passed through the anterior abdominal wall,  placed into the cystic duct, and cholangiogram was performed.   Under real time fluoroscopy, dilute contrast was injected into the cystic  duct which was small and thin.  The common hepatic, right and left hepatic,  common bile ducts all opacified promptly and contrast drained rapidly into  the duodenum without obvious evidence of obstruction.  Final reports pending  radiologist's interpretation.   The cholangiocatheter was removed, the cystic duct was clipped two times  proximally and divided.  The cystic artery was then clipped and divided.  I  identified the  posterior branch of the cystic artery which was clipped and  divided, as well.  The gallbladder was dissected free from liver using  electrocautery.  The gallbladder wall was very thin so there were 2-3 areas  of perforation during the dissection and some bile leaked out but no stones.  Once the gallbladder was freed up from the liver, it was placed in an  endopouch bag.  The gallbladder fossa was then copiously irrigated out with  saline solution and the solution evacuated.  Bleeding was controlled with  electrocautery.  The solution being evacuated then became clear.  There was  no evidence of bleeding or bile leak upon inspection.   The gallbladder was removed through the Endopouch bag through the  subumbilical port and the subumbilical fascial defect closed by tightening  up  and tying down the pursestring suture under direct vision.  The remaining  trocars were removed and the pneumoperitoneum was released.  The skin  incisions were closed with 4-0 Monocryl subcuticular stitches followed by  Steri-Strips and sterile dressings.   He tolerated the procedure well without any apparent complications and was  taken to the recovery room in satisfactory condition.      Adolph Pollack, M.D.  Electronically Signed     TJR/MEDQ  D:  01/19/2006  T:  01/20/2006  Job:  [id number]   cc:   Cassell Clement, M.D.  Fax: [phone]   Ernestina Penna, M.D.  Fax: [phone]

## 2010-09-17 NOTE — Op Note (Signed)
NAME:  [name], [name] NO.:  [id number]   MEDICAL RECORD NO.:  [id number]          PATIENT TYPE:  INP   LOCATION:  2303                         FACILITY:  [facility]   PHYSICIAN:  Quita Skye. Krista Blue, M.D.  DATE OF BIRTH:  11-05-1940   DATE OF PROCEDURE:  06/21/2005  DATE OF DISCHARGE:                                 OPERATIVE REPORT   PROCEDURE PERFORMED:  Transesophageal echocardiogram.   INDICATIONS FOR PROCEDURE:  [name] is a 70 year old white male  who presents to the operating room for redo coronary artery bypass grafting.  Following a routine cardiac  induction, [name] was intubated.  Transesophageal echo probe was lubricated, covered with a latex-free sheath  and inserted into the patient's esophagus with no resistance.  The overall  images of the heart showed no evidence of pericardial effusion.  The right  atrium was normal in size.  No evidence of thrombus or masses.  The  tricuspid valve appeared normal in structure with trace regurgitation.  The  pulmonary artery catheter seen crossing the tricuspid valve into the right  ventricle.  The right ventricle had good contractility.  No evidence of  segmental wall motion abnormalities, no evidence of ventricular septal  defect.  The pulmonary valve had trace regurgitation with normal-appearing  structure.  The left atrium was then evaluated.  There was no evidence of  septal defect.  The left atrium had no thrombus or masses in the atrium or  the appendage.  Pulmonary veins were evaluated which showed normal pulmonary  vein flow with an S wave of 69 cm per second and a V wave of 57 cm per  second.  The mitral valve had 1+ regurgitation with central appearing jet.  There was no evidence of flail or prolapse.  There was some mild thickening  of the mitral valve and some mild annular calcification.  The mitral inflow  pattern was evaluated with an E wave of 64 and an A wave of 50 cm per  second.  The  left ventricle had overall good contractility with some  decrease in thickening in the anterior to posterior region of the left  ventricle.  The patient had a mildly dilated left ventricle.  The aortic  valve was evaluated.  There was no evidence of stenosis  or regurgitation.  Telemetry determined the area of the valve to be greater than 3.5 cm2.  The  patient had some mild atherosclerotic changes of the thoracic aorta.  The  patient underwent coronary artery bypass grafting, separated successfully  from the bypass machine.  Images of the heart demonstrated good  contractility with some mild hypokinesis in the inferior to posterior region  of the left ventricle.  The mitral valve remained some mild 1+ regurgitation  and the patient did well.  The probe was removed and the patient was taken  to the SICU in good condition.           ______________________________  Quita Skye Krista Blue, M.D.     JDS/MEDQ  D:  06/21/2005  T:  06/22/2005  Job:  [id number]

## 2010-09-17 NOTE — H&P (Signed)
NAME:  [name], ARDOIN NO.:  [id number]   MEDICAL RECORD NO.:  [id number]          PATIENT TYPE:  INP   LOCATION:  1830                         FACILITY:  [facility]   PHYSICIAN:  Cassell Clement, M.D. DATE OF BIRTH:  1940/09/02   DATE OF ADMISSION:  06/15/2005  DATE OF DISCHARGE:                                HISTORY & PHYSICAL   CHIEF COMPLAINT:  Chest pain.   HISTORY:  This is a 70 year old married Caucasian gentleman who is admitted  to the emergency room because of worsening chest pain.  The patient has a  history of known coronary artery disease.  In 1991 he underwent coronary  artery bypass graft surgery by Dr. Dewayne Shorter.  In 2003 Dr. Donnie Aho did  cardiac catheterization because of increasing chest discomfort and at that  time the patient was found to have severe native three vessel disease as  well as left main disease and he was found to have occlusion of saphenous  vein grafts to the circumflex and obtuse marginal, posterior descending,  posterolateral branch.  He was found to have a patent internal mammary  artery graft to the LAD.  He had abnormal left ventricular function with  inferior hypokinesis.  At that time it was elected to continue to treat him  medically and he has done well for the next four years, but recently has had  some increasing chest discomfort and has not felt comfortable in trying to  do his usual walking exercise.  The chest discomfort is worse with exertion.  It is substernal and does not radiate.  Last evening he had increasing chest  discomfort and came to the emergency room.  On June 14, 2005 the patient  had been in our office for a treadmill Cardiolite stress test.  The  Cardiolite was abnormal and showed a large inferolateral defect ___________  reversibility.  His ejection fraction had dropped from a previous 42% down  to 37% at present.  Now in the emergency room on IV nitroglycerin he is  having no significant  discomfort.  Initial cardiac enzymes are normal and  his electrocardiogram today shows no acute changes.  According to the  treadmill test yesterday the patient has significant ST segment changes  associated with chest pain while on the treadmill.   PRESENT MEDICATIONS:  1.  Prilosec 40 mg daily.  2.  Xanax 0.5 mg t.i.d.  3.  Lipitor 40 mg daily.  4.  Plavix 75 mg daily.  5.  Generic Lotensin 10 mg daily.  6.  Doxepin 25 mg three times a day.  7.  Aspirin 81 mg daily.  8.  Stool softener daily.  9.  Toprol XL 100 daily.  10. Foltx one daily.  11. Allegra D 60 mg p.r.n.  12. Generic Imdur 60 mg in the morning and 30 mg in the evening.  13. Flonase one to two inhalations daily each nostril p.r.n.  14. Nitrostat 1/150 sublingually p.r.n.   ALLERGIES:  The patient is allergic to [facility] which causes confusion.   SOCIAL HISTORY:  He lives with his wife.  He does not  use alcohol or  tobacco.  He is on disability from his heart disease.   PAST MEDICAL HISTORY:  1.  Hypertension.  2.  Dyslipidemia.  3.  The patient also has a history of anxiety.  4.  He is not diabetic.   He quit smoking 30 years ago.   He has a strong family history of early coronary disease including a brother  who is alive and who has had bypass surgery.   Previous operations include herniorrhaphy, tonsillectomy, and coronary  bypass graft surgery.   Review of systems is otherwise negative in detail.   PHYSICAL EXAMINATION:  VITAL SIGNS:  Blood pressure is 120/70, pulse 80,  regular.  Respirations are normal.  HEENT:  Negative.  Pupils were equal and reactive.  Sclerae nonicteric.  Jugular venous pressure normal.  Carotids normal.  Thyroid normal.  No  lymphadenopathy.  Mouth and pharynx normal.  CHEST:  Clear to percussion and auscultation.  HEART:  Quiet precordium without murmur, gallop, rub, or click.  ABDOMEN:  Soft, nontender.  No masses.  EXTREMITIES:  Weak pedal pulses.   Chest x-ray is pending.   EKG shows no acute changes.  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   ADMISSION LABORATORIES:  Sodium 131, potassium 4.6, BUN 10, creatinine 1.1.  Hemoglobin 12.9.  CK-MB less than 1, troponin less than 0.05.   IMPRESSION:  1.  Chest pain consistent with unstable angina.  2.  Status post CABG 1991.  3.  Hyperlipidemia.  4.  Essential hypertension.  5.  Anxiety.   DISPOSITION:  We are admitting to telemetry.  Will treat with IV  nitroglycerin.  Will place him on Lovenox per pharmacy protocol.  Will get  serial enzymes and EKGs.  Anticipate cardiac catheterization on February 15  in the afternoon by Dr. Peter Swaz[place]and to determine whether the patient might  benefit from an attempt at PCI versus redo coronary artery bypass graft  surgery.           ______________________________  Cassell Clement, M.D.     TB/MEDQ  D:  06/15/2005  T:  06/15/2005  Job:  [id number]   cc:   Peter M. Swaziland, M.D.  Fax: [phone]   Ernestina Penna, M.D.  Fax: [phone]

## 2010-09-17 NOTE — Op Note (Signed)
NAME:  [name], [name] NO.:  [id number]   MEDICAL RECORD NO.:  [id number]          PATIENT TYPE:  INP   LOCATION:  2303                         FACILITY:  [facility]   PHYSICIAN:  Salvatore Decent. Dorris Fetch, M.D.DATE OF BIRTH:  Aug 26, 1940   DATE OF PROCEDURE:  06/21/2005  DATE OF DISCHARGE:                                 OPERATIVE REPORT   PREOPERATIVE DIAGNOSIS:  Three-vessel disease with refractory angina.   POSTOPERATIVE DIAGNOSIS:  Three-vessel disease with refractory angina.   PROCEDURE:  Redo median sternotomy, extracorporeal circulation, redo  coronary bypass grafting x3 (saphenous vein graft obtuse marginal,  sequential saphenous vein graft to posterior descending and posterolateral),  endoscopic vein harvest left leg.   SURGEON:  Salvatore Decent. Dorris Fetch, M.D.   FIRST ASSISTANT:  Constance Holster, PA.   SECOND ASSISTANT:  Theda Belfast, PA   ANESTHESIA:  General.   FINDINGS:  Poor quality targets, extensive intrapericardial adhesions of the  saphenous vein good-quality, not a candidate for additional redo procedures  of these vessels.   CLINICAL NOTE:  [name] is a 70 year old gentleman who previously had  coronary bypass grafting by Dr. Karle Plumber 16 years prior to this  admission. He had had recurrent disease in 2003 with recurrent angina and at  that time was put on maximal medical therapy. He had done well since that  time but now presents with refractory angina despite maximal medical  therapy.  At catheterization he had severe native three-vessel disease. He  had a patent mammary to his LAD which was supplying via collaterals  essentially the entire heart. His previous saphenous vein grafts had been  occluded since at least 2003.  The patient was offered redo coronary bypass  grafting in hopes of the symptom relief. He understood there was no  guarantee with pain relief and no significant survival advantage given that  he had a patent  mammary to his LAD. However, there was no additional medical  therapy to offer.  The patient understood the risks and benefits of surgery  and wished to proceed.   OPERATIVE NOTE:  [name] was brought the preop holding area on June 21, 2005. There the anesthesia service placed lines for monitoring arterial,  central venous and pulmonary arterial pressure. Intravenous antibiotics were  administered. The chest, abdomen and legs were prepped and draped in the  usual fashion. Incision was made through the patient's prior midline  sternotomy incision.  It was carried through the skin and subcutaneous  tissue. The underlying wires were removed. The sternum was divided with an  oscillating saw.  Each half of the sternum then was lifted and the  underlying tissue was dissected off. Of note, the right ventricle was  densely adherent to the left side of the sternum and this was taken down  with care to allow placement of a retractor. Simultaneously incision in the  medial aspect of the left leg.  The  greater saphenous vein was harvested  from the groin to just below the knee endoscopically. The saphenous vein was  of good quality.  The patient was heparinized.  He was treated with aprotinin  as it was a redo procedure.  Adhesions were taken down over the ascending  aorta and the aorta was cannulated via concentric 2-0 Ethibond pledgeted  pursestring sutures.  Adhesions were taken down off the right atrium and the  right atrium was cannulated with a dual stage venous cannula.  After  insuring adequate anticoagulation with ACT measurement cardiopulmonary  bypass was instituted. Flows were maintained per protocol throughout the  procedure.  The patient was cooled to 32 degrees Celsius.  Additional  pericardial adhesions were taken down, taking care not to manipulate the  vein grafts. The vein grafts as noted were both totally occluded the right  vein graft was sclerotic.  The vein to the OMs  was obviously diseased but  not sclerotic.  Great care was taken with the adhesions in the vicinity of  the left internal mammary artery which had been brought through a window in  the pericardium. This was isolated and a vascular tape was placed around the  mammary pedicle.   The retrograde cardioplegic cannula placed via pursestring suture in the  right atrium. An antegrade cardioplegic cannula was placed in the ascending  aorta.  Two soft bulldog clamps were placed on the mammary pedicle.  The  aorta was crossclamped. The left ventricle was emptied via the aortic root  vent. Cardiac arrest then was achieved with a  combination of cold antegrade  and retrograde blood cardioplegia and topical iced saline.  After achieving  a complete diastolic arrest and adequate myocardial septal cooling to 10  degrees Celsius, the following distal anastomoses were performed.   First, a reverse saphenous vein graft was placed sequentially to the  posterior descending and posterior lateral branches of the right coronary  artery.  Posterior descending was 1.5 mm poor quality target. The  posterolateral was the same.  Probe did pass distally in both vessels.  A  side-to-side anastomosis was performed to the posterior descending and end-  to-side posterolateral.  Both were done with running 7-0 Prolene sutures.  Each anastomosis was probed proximally and distally to insure patency.  Cardioplegia then was administered and there was good flow and good  hemostasis.   Next, the heart was elevated and the first obtuse marginal branch was  dissected out. This was a 1.5 mm intramyocardial vessel and was poor  quality. The vein graft was good quality.  It was anastomosed end-to-side  with running 7-0 Prolene suture. Again the anastomosis probed easily  proximally and distally. Cardioplegia was administered. There was good flow  and good hemostasis.  Additional cardioplegia then was administered via the  retrograde cannula.  The vein grafts were cut to length. The cardioplegic cannula was removed  from the ascending aorta.  The proximal vein graft anastomoses were  performed with running 6-0 Prolene sutures to 4.5 mm punch aortotomies.  Because of limited space on the ascending aorta, the vein to the posterior  descending and posterolateral was taken off the same side as the previous  vein graft. At completion of the final proximal anastomosis, the bulldog  clamp was removed from the left mammary artery. Immediate and rapid septal  rewarming was noted. Lidocaine was administered.  Warm dose of retrograde  cardioplegia was administered to help de-air the coronaries.  The aortic  root was de-aired and the aortic crossclamp was removed. Total crossclamp  time was 73 minutes.   The retrograde cardioplegic cannula was removed. All proximal and distal  anastomoses were inspected  for hemostasis as the patient rewarmed.  Epicardial pacing wires placed on the right ventricle and right atrium.  When the patient had rewarmed to a core temperature of 37 degrees Celsius,  he was weaned from cardiopulmonary bypass without difficulty. Total bypass  time was 144 minutes.  The initial cardiac index was 2 liters per minute per  sq m. The patient remained hemodynamically stable throughout post bypass  period.   A test dose of protamine was administered and was well tolerated.  The  atrial and aortic cannulae were removed. The remainder of the protamine was  administered without incident. Dr. Claybon Jabs of the anesthesia performed  transesophageal echocardiography which showed no change from the  preoperative study with trace to 1+ mitral regurgitation but overall  preserved left jugular function. The patient remained hemodynamically  stable. The atrial and aortic cannulae were removed. The remainder of the  protamine was administered without incident. The chest irrigated with 1  liter of warm normal  saline containing 1 g of vancomycin. Two mediastinal  chest tubes were placed through separate subcostal incisions.  The sternum  was closed with interrupted heavy gauge stainless steel wires.  The remainder if the incision was closed in standard fashion. All sponge,  needle and instrument counts were correct at the end of the procedure. The  patient was taken from the operating room to the surgical intensive care  unit in critical but stable condition.           ______________________________  Salvatore Decent Dorris Fetch, M.D.     SCH/MEDQ  D:  06/21/2005  T:  06/22/2005  Job:  [id number]   cc:   Cassell Clement, M.D.  Fax: [phone]   Ernestina Penna, M.D.  Fax: [phone]

## 2010-09-17 NOTE — H&P (Signed)
[place]  Patient:    [name], [name]                MRN: [id number] Adm. Date:  [id number] Attending:  Rudean Hitt                         History and Physical  CHIEF COMPLAINT:  Chest pain.  HISTORY:  This is a 70 year old married Caucasian gentleman admitted with chest pain.  He has a past history of undergoing coronary artery bypass graft surgery in 1991.  Post surgery he continued to have chest pain and so he had a catheterization several months after his bypass graft surgery.  This unfortunately showed that the saphenous vein graft to the right coronary artery was occluded and the distal limb of a sequential graft to the marginal was occluded.  There was also a kink or bend in the mammary artery to the LAD.  There was nothing that could be done from a percutaneous standpoint and medical therapy was recommended.  He has done amazingly well on medical therapy and he walks three miles a day with no chest discomfort.  He has had periodic treadmill tests in my office; the last being April of 2000, and it was negative, walking 19 minutes on the rehab protocol.  This morning the patient did some "weed eating" at home.  At about 1 P.M. the patient developed the onset of substernal chest discomfort, which was a pressure-like sensation with some radiation to the left neck.  The pain was somewhat similar to what he remembers experiencing back in 1991.  There was slight nausea, no vomiting and he did have some pallor when the ambulance arrived.  MEDICATIONS:  The patients medications include Xanax 0.5 mg b.i.d., atenolol 50 mg taking a half tablet daily, generic Adalat CC 60 mg daily, doxepin 25 mg tablets taking two at night and one in the morning, Tagamet 300 mg, generic, taking two daily, aspirin 81 mg daily, Tylenol 500 mg p.r.n., Nitrostat 1/150 sublingually p.r.n., Phenergan 25 mg p.r.n., vitamin E 400 units daily, Allegra D  1 every 12 hours p.r.n., Carafate slurry p.r.n., and Lipitor 20 mg daily.  ALLERGIES:  The patient is allergic to SULFA and to [facility]  SOCIAL HISTORY:  Reveals he is retired.  He quit smoking at age 65.  He does not drink any alcohol.  He is married.  FAMILY HISTORY:  The family history reveals that he has a strong family history of coronary disease.  His father died of a heart attack and he has a brother who has had coronary bypass graft surgery.  His mother died of a stroke.  REVIEW OF SYSTEMS:  Reveals that he has not had any recent symptoms to suggest peptic ulcer disease.  He did have some diarrhea Saturday, which appeared to be self-limited.  Genitourinary history reveals no dysuria or hematuria, or nocturia.  Respiratory reveals a history of allergies for which he takes Allegra D.  Psychiatric reveals a long history of anxiety and he is on long term antidepressants.  PHYSICAL EXAMINATION:  VITAL SIGNS:  On physical exam blood pressure is 124/79, pulse 79 and regular, respirations are normal.  GENERAL APPEARANCE:  Color is good.  NECK:  Jugular venous pressure is normal.  Carotids normal.  Thyroid normal. No lymphadenopathy.  CHEST:  Clear.  SPINE AND THORAX:  Normal.  HEART:  Reveals a quiet precordium without murmur, rub or gallop, or  click.  ABDOMEN:  Soft.  Liver and spleen are not enlarged.  There is no abdominal mass or tenderness.  EXTREMITIES:  Show no phlebitis or edema.  Pedal pulses are present.  LABORATORY DATA:  Chest x-ray shows slight cardiomegaly with no active disease on a portable film.  EKG shows normal sinus rhythm with no acute changes.  Laboratory studies are pending.  His I-STAT is unremarkable.  IMPRESSION: 1. Chest pain, rule out myocardial infarction. 2. Status post coronary artery bypass graft in 1991. 3. History of hypercholesterolemia. 4. History of anxiety and depression.  DISPOSITION:  We are going to admit for evaluation.   He will be placed on a telemetry bed.  We will give him IV nitroglycerin and IV heparin.  We will continue beta blockers and aspirin.  He will undergo cardiac catheterization on September 11th in the afternoon by Dr. Lacretia Nicks. York Spaniel. DD:  01/10/00 TD:  01/11/00 Job: [id number]

## 2010-09-17 NOTE — H&P (Signed)
NAME:  [name], [name] NO.:  [id number]   MEDICAL RECORD NO.:  [id number]            PATIENT TYPE:   LOCATION:                                 FACILITY:   PHYSICIAN:  Peter M. Swaziland, M.D.       DATE OF BIRTH:   DATE OF ADMISSION:  DATE OF DISCHARGE:                              HISTORY & PHYSICAL   HISTORY OF PRESENT ILLNESS:  [name] is a 70 year old white male  with known history of coronary artery disease.  He is status post  coronary artery bypass surgery in 1991.  In February 2007 he underwent  redo bypass surgery by Dr. Andrey Spearman due to the fact that all of  his vein grafts had occluded.  His LIMA was still patent at that time.  On his redo he had a saphenous vein graft placed to an obtuse marginal  vessel; and a sequential saphenous vein graft to the posterior  descending and posterolateral branches of the right coronary artery.  He  did very well up until approximately 2 weeks ago.  Since then he has  been experiencing symptoms of substernal chest pain and shortness of  breath with exertion.  Previously he could walk 33 minutes without any  difficulty; now, if he just walks 10 minutes he develops chest pain and  shortness of breath.  It is noteworthy that the patient did undergo  cholecystectomy in September of this year.  He was also admitted earlier  in November and underwent upper endoscopy.  At that time, his esophagus  was normal; and there was no evidence of stricture.  Because of his  ongoing symptoms, it is recommended that he undergo diagnostic cardiac  catheterization.   PAST MEDICAL HISTORY:  1. Coronary artery disease status post CABG in 1991 with redo in      February of 2007.  2. History of postop atrial fibrillation, resolved.  3. GERD with previous esophageal strictures, status post several      dilatations in the past.  4. Chronic anxiety.  5. Status post cholecystectomy.  6. Dyslipidemia.  7. Seasonal allergies.  8.  Hypertension.   ALLERGIES:  He does have allergy to sulfa.   HIS CURRENT MEDICATIONS INCLUDE:  1. Prilosec 20 mg b.i.d.  2. Xanax 0.5 mg daily.  3. Lotensin 10 mg daily.  4. Doxepin 25 mg in the morning 50 in the evening.  5. Colace 100 mg daily.  6. Simvastatin 80 mg daily.  7. Imdur 30 mg per day.  8. Metoprolol 50 mg in the morning and 25 in the evening.   SOCIAL HISTORY:  The patient denies tobacco or alcohol use.  He is  married and has children.  He is retired from [employer].   FAMILY HISTORY:  Noncontributory.   REVIEW OF SYSTEMS:  As noted, otherwise negative.   PHYSICAL EXAM:  GENERAL:  The patient is A pleasant white male in no  apparent distress.  VITAL SIGNS:  Weight is 185.  Blood pressure 130/80, pulse is 60 and  regular.  HEENT EXAM:  Normocephalic, atraumatic.  Pupils  equal, round, reactive  to light and accommodation.  Extraocular movements are full.  Oropharynx  is clear.  NECK:  Supple without JVD, adenopathy, thyromegaly or bruits.  LUNGS:  Clear to auscultation and percussion.  CARDIAC EXAM:  Reveals a regular rate and rhythm.  Normal S1-S2 without  gallop, murmur or click.  He has a healed median sternotomy scar.  ABDOMEN:  Soft, nontender without masses or bruits.  EXTREMITIES:  Without edema.  Pulses are 2+ and symmetric.  NEUROLOGIC EXAM:  Nonfocal.   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  Chest x-ray dated March 18, 2006  showed no acute change.  Other labs are pending.   IMPRESSION:  1. Recurrent anginal symptoms status post redo coronary bypass surgery      in February of 2007.  2. Gastroesophageal reflux disease status post esophageal dilatations.  3. Status post cholecystectomy.  4. Anxiety.  5. Hypertension.  6. Hyperlipidemia.   PLAN:  Will proceed with diagnostic cardiac catheterization with further  therapy pending these results.            ______________________________  Peter M. Swaziland, M.D.     PMJ/MEDQ  D:  03/30/2006  T:  03/31/2006  Job:  [id number]   cc:   Cassell Clement, M.D.  Graylin Shiver, M.D.

## 2010-09-17 NOTE — Cardiovascular Report (Signed)
[place]  Patient:    [name], [name]                MRN: [id number] Proc. Date: 01/11/00 Adm. Date:  [id number] Attending:  Rudean Hitt CC:         Thomas A. Patty Sermons, M.D.  Colon Flattery, D.O.   Cardiac Catheterization  PROCEDURES:  Cardiac catheterization with coronary angiography, bypass angiography x 2 and internal mammary graft angiography.  COMMENTS ABOUT PROCEDURE:  The patient tolerated the procedure well without complications.  Please see the previous attached log for the medications, catheters and events during the procedure.  The right coronary artery was selected using the right coronary catheter. Internal mammary artery selected using an IMA catheter.  The vein graft to the marginal was selected using a Amplatz left II catheter.  He tolerated the procedure well.  HEMODYNAMIC DATA:  Aorta post contrast 157/74, LV post contrast 157/13.  ANGIOGRAPHIC DATA:  LEFT VENTRICULOGRAM:  The left ventriculogram performed in the 30 degree RAO projection.  The aortic valve was normal.  The mitral valve was normal.  The left ventricle slightly dilated.  There is inferior hypokinesis noted and mild anterolateral hypokinesis noted.  Ejection fraction estimated at 45%.  Coronary arteries arise and distribute normally.  There is significant calcification involving the left coronary and right coronary system.  Left main coronary artery:  The left main coronary artery has a segmental mid to distal 60-70% LAD stenosis.  Left anterior descending:  The left anterior descending has a ostial 70% stenosis prior to a diagonal branch.  The LAD is occluded after the diagonal branch.  Circumflex:  The circumflex has multiple severe stenoses involving the proximal portion of the vessel prior to a third and forth posterolateral branch.  There a caudle filling is seen filling the distal portion of the right coronary artery.  Right  coronary artery:  The right coronary artery is occluded proximally.  Saphenous vein graft to the PD and PL is occluded.  Saphenous vein graft to the intermediate and obtuse marginal artery is patent. There is moderate segmental disease with ulceration in proximal and midportion estimated at 50%.  There is aneurysmal dilatation at the distal portion of the graft.  The vessel fills the distal obtuse marginal artery and then the posterior descending artery fills by collaterals.  Internal mammary graft to the LAD is widely patent.  There is a linear filling defect noted in the midportion of the graft seen in only one view.  The significant of this is uncertain.  It does not appear present in the other views.  IMPRESSION: 1. Abnormal left ventricular function with inferior and mild anterolateral    hypokinesis.  Ejection fraction of 45%. 2. Severe left main and native three-vessel coronary artery disease. 3. Occlusion of vein graft to the posterior descending and posterolateral    and distal limb of the obtuse marginal graft.  The proximal limb was    still patent. 4. Patent internal mammary graft to the left anterior descending. 5. Patent but diseased graft to the obtuse marginal.  RECOMMENDATIONS:  Intensive medical therapy to begin with.  Consideration of repeat bypass if fails medical therapy. DD:  01/11/00 TD:  01/12/00 Job: [id number]

## 2010-09-17 NOTE — Discharge Summary (Signed)
NAME:  [name], [name] NO.:  [id number]   MEDICAL RECORD NO.:  [id number]          PATIENT TYPE:  INP   LOCATION:  2002                         FACILITY:  [facility]   PHYSICIAN:  Salvatore Decent. 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   IN-HOSPITAL DIAGNOSES:  1.  Postoperative atrial fibrillation.  2.  Volume overload.  3.  Acute blood loss anemia.   SECONDARY DIAGNOSES:  1.  History of coronary artery disease, status post coronary artery bypass      grafting.  2.  Hypertension.  3.  Dyslipidemia.  4.  Anxiety.  5.  Allergies.  6.  History of constipation.   ALLERGIES:  ULTRAM (causes confusion).   PROCEDURES/OPERATIONS:  1.  Cardiac catheterization with coronary left ventricular angiography, left      heart catheterization, saphenous vein graft angiography x2, and LIMA      graft angiography.  2.  Re-do coronary artery bypass grafting x2 using saphenous vein graft to      obtuse marginal and sequential saphenous vein graft to posterior      descending and posterior lateral arteries. Endoscopic vein harvesting      from the left leg was done.  3.  Transesophageal echocardiogram.   HISTORY OF PRESENT ILLNESS:  Mr. Grandison is a 70 year old gentleman who  previously had coronary artery bypass grafting done by Dr. Edwyna Shell 16 years  prior to this admission. He had recurrent disease in 2003 with recurrent  angina and at that time, was put on maximal medical therapy. The patient had  done well since that time but now presents with refractory angina despite  maximal medical therapy. A cardiac catheterization was done on June 16, 2005 by Dr. Swaziland. This showed severe 3 vessel obstructive coronary artery  disease. There is patent left internal mammary  artery graft to left anterior  descending artery with collateral flow to the left circumflex and right  coronary artery distribution. There was an occluded saphenous vein graft to  posterior descending artery and posterolateral branch of the right coronary  artery. There is an occluded saphenous vein graft to the diagonal obtuse  marginal vessels. There is moderate left ventricular dysfunction. Following  catheterization, Dr. Dorris Fetch was consulted. Dr. Dorris Fetch evaluated  Mr. Indelicato. He discussed undergoing re-do coronary artery bypass grafting.  He discussed risks and benefits of this procedure. The patient acknowledges  understanding and agrees to proceed. The patient was scheduled for surgery  for June 21, 2005. Prior to undergoing surgery, the patient had  preoperative bilateral carotid duplex ultrasounds done. This showed no  evidence of significant ICA stenosis. The patient remained stable prior to  undergoing surgery.   HOSPITAL COURSE:  The patient was taken to the operating room June 21, 2005, where he underwent re-do coronary artery bypass grafting x3. The  patient had saphenous vein to the obtuse marginal and sequential saphenous  vein  graft to posterior descending and posterolateral arteries. Endoscopic  vein harvesting was done from the left leg. The patient tolerated this  procedure well and was transferred up to the Intensive Care Unit in stable  condition. The patient was extubated late evening, early morning following  surgery. Following extubation, the patient seemed to be alert and oriented  x3 and neurologic intact. The patient's postoperative course was complicated  by postoperative atrial fibrillation. He went into atrial fibrillation  postoperative day 3. Amiodarone drip was started at that time. The patient  did convert back into normal sinus rhythm following. The following day, IV  amiodarone was discontinued and the patient started on p.o.  amiodarone. He  remained in normal sinus rhythm the remainder of his postoperative course.  Plan is to discharge patient home on p.o. amiodarone. Also following  surgery, the patient developed acute blood loss anemia. He was asymptomatic  from this. Hemoglobin and hematocrit were monitored daily postoperatively.  The patient was started on Niferex. Unfortunately, the patient's hemoglobin  and hematocrit continued to decrease. Postoperative day 4, it was 7.5 and  21.4. One unit of packed red blood cells was given and the following day, it  increased appropriately to 8.8 and 25.3. Occult stools are sent and so far  were negative. Will follow up with hemoglobin and hematocrit in a.m. to  evaluate if remaining stable. The patient will be discharged home on iron.  The patient also developed volume overload postoperatively. He was started  on diuretics. On postoperative day 5, the patient was still 12 pounds above  his preoperative  weight. He was 197.6 pounds and his preoperative weight is  185. The patient will be continued on the diuretic at discharge home.   The patient's chest tubes and lines were discontinued in the normal fashion.  He was out of bed and ambulating well. The patient was able to be weaned off  oxygen, satting greater than 90% on room air. He was transferred out to 2000  postoperative day 2. The patient remained afebrile during his postoperative  course. Vital signs were monitored and medications were adjusted  appropriately. A postoperative chest x-ray showed to be stable. Prior to  discharge, the patient seemed to be clear to auscultation bilaterally. The  patient's incisions were dry and intact and healing well. He was tolerating  regular diet with no nausea and vomiting noted. Bowel movements within  normal limits prior to discharge home.   LABORATORY DATA:  Postoperative creatinine remained stable. Seen to be 1.3 on postoperative day 4.   DISPOSITION:  The patient is  tentatively ready for discharge on  postoperative day 6, June 27, 2005. This will be pending patient's a.m.  hemoglobin and hematocrit.   FOLLOW UP:  1.  Appointment has been arranged with Dr. Dorris Fetch for July 15, 2005 at      11:30 a.m.  2.  The patient will need to contact Dr. Yevonne Pax office to schedule a      followup appointment with him in 2 weeks.  3.  AP and lateral chest x-ray will be done at this appointment, which he      will then bring with him to Dr. Sunday Corn office.   ACTIVITY:  He was told no driving until told to do so. No heavy lifting over  10 pounds. The patient was told to ambulate 3 to 4 times per day and  progress as tolerated and to continue his breathing exercises.   WOUND CARE:  The patient  was told that he was allowed to shower, washing his  incisions using soap and water. He is to contact the office if he develops  any drainage or opening from any of his incision sites. He acknowledges  understanding.   DIET:  He was educated on diet to be low-fat, low-salt.   DISCHARGE MEDICATIONS:  1.  Aspirin 325 mg daily.  2.  Toprol XL 50 mg daily.  3.  Lisinopril 10 mg daily.  4.  Lipitor 40 mg at night.  5.  Prilosec 40 mg b.i.d.  6.  Doxepin 25 mg t.i.d.  7.  Xanax 0.25 mg t.i.d.  8.  Lasix 40 mg daily x5 days.  9.  Potassium chloride 20 mEq daily x5 days.  10. Amiodarone 400 mg b.i.d. x2 weeks and 200 mg b.i.d.  11. Niferex 150 mg b.i.d.  12. Tylox 1 to 2 tablets q.4 to 6 hours p.r.n. pain.      Theda Belfast, PA    ______________________________  Salvatore Decent Dorris Fetch, M.D.    KMD/MEDQ  D:  06/26/2005  T:  06/27/2005  Job:  [id number]   cc:   Cassell Clement, M.D.  Fax: [phone]

## 2010-09-17 NOTE — Discharge Summary (Signed)
[place]. [facility]  Patient:    [name], [name]                MRN: [id number] Adm. Date:  [id number] Disc. Date: [id number] Attending:  Rudean Hitt CC:         [name], M.D.   Discharge Summary  FINAL DIAGNOSES: 1. Chest pain. 2. Unstable angina. 3. Bladder neck obstruction. 4. Coronary atherosclerosis. 5. Anxiety and depression. 6. Hypercholesterolemia. 7. Benign hypertrophy of the prostate.  OPERATIONS PERFORMED: 1. Left heart cardiac catheterization on January 11, 2000, by W. Ashley Royalty., M.D. 2. Insertion of indwelling catheter by [name], M.D., on the same day.  HISTORY OF PRESENT ILLNESS:  This 70 year old, married, Caucasian gentleman was admitted as an emergency on January 10, 2000, because of chest pain.  He had had previous coronary artery bypass graft surgery in 1991.  Post surgery, he continued to have a lot of chest pain and he had cardiac catheterization several months after his bypass surgery, which showed unfortunately that the saphenous vein graft to the right coronary artery was occluded and the distal limb of the sequential graft of the marginal was also occluded.  There was also a kink or bend in the mammary artery to the LAD.  There was nothing further that could be done from a percutaneous standpoint and medical therapy was recommended.  The patient was embarked on a program of vigorous exercise and diet and has done very well.  He continues to walk three miles a day.  He has had negative stress tests in my office, the last being April of 2000 when he walked 19 minutes on a rehabilitation protocol.  On the morning of this admission, he had done some outdoor exercise using a weed eater.  Several hours later, he began having some substernal chest discomfort with radiation to the left neck.  The pain was similar to what he remembers experiencing in 1991.  He did have some pallor  when the ambulance arrived.  PHYSICAL EXAMINATION:  Blood pressure 124/79 and pulse 79 and regular.  GENERAL APPEARANCE:  Color good.  NECK:  Jugular venous pressure normal.  CHEST:  Clear.  HEART:  No murmurs, rubs, or gallops.  ABDOMEN:  Negative.  EXTREMITIES:  Negative.  Pedal pulses were good.  HOSPITAL COURSE:  The chest x-ray showed slight cardiomegaly, but no active disease.  The EKG showed normal rhythm and no acute change.  The patient was placed on telemetry.  He was given IV nitroglycerin and IV heparin.  Beta blockers and aspirin were continued.  The following day, arrangements were made for him to undergo cardiac catheterization by W. Ashley Royalty., M.D.  The patient underwent cardiac catheterization.  It was found that the LIMA was still patent and had collaterals from the LAD to the right.  The saphenous vein graft to the posterior descending and posterolateral was occluded.  The saphenous vein graft to the immediate OM showed about a 50% narrowing with ulceration and then was occluded.  In view of the findings, the patients therapy was altered.  Plavix was added to his regimen.  He was moved to a telemetry bed.  Activity was begun and he was able to walk in the hall.  By the following morning, he had no further episodes of chest discomfort, was tolerating the Plavix and other medicines, and was able to be discharged home improved.  The groin remained stable with no  evidence of hematoma.  DISCHARGE MEDICATIONS:  The patient will continue his same medications and in addition will be on Plavix 75 mg once a day.  We are stopping Adalat at this point.  ACTIVITY:  He is to gradually increase his activity back to his usual baseline.  DIET:  He will be on a low-cholesterol diet.  FOLLOW-UP:  He is to see Brett Fus A. Patty [name], M.D., in mid October for an office visit. DD:  01/26/00 TD:  01/27/00 Job: [id number]

## 2010-09-17 NOTE — Cardiovascular Report (Signed)
NAME:  [name], CASPERS NO.:  [id number]   MEDICAL RECORD NO.:  [id number]          PATIENT TYPE:  INP   LOCATION:  4728                         FACILITY:  [facility]   PHYSICIAN:  Peter M. Swaziland, M.D.  DATE OF BIRTH:  [birth year]   DATE OF PROCEDURE:  06/16/2005  DATE OF DISCHARGE:                              CARDIAC CATHETERIZATION   INDICATION FOR PROCEDURE:  [name] is a 70 year old white male who is  status post coronary artery bypass surgery in 1991. He presents with  increasing anginal symptoms refractory to medical therapy.   PROCEDURES:  Coronary left ventricular angiography, left heart  catheterization, saphenous vein graft angiography x2 and LIMA graft  angiography.   ACCESS:  Via the right femoral artery using standard Seldinger technique.   EQUIPMENT:  6-French 4 cm right and left Judkins catheter, 6-French pigtail  catheter, 6-French arterial sheath, 6-French LIMA catheter,   MEDICATIONS:  Local anesthesia 1% Xylocaine.   CONTRAST:  135 cc of Omnipaque.   HEMODYNAMIC DATA:  Aortic pressure was 130/62 with a mean of 91 mmHg, left  ventricular pressure is 130 with EDP of 11 mmHg.   ANGIOGRAPHIC DATA:  The left coronary arises and distributes normally. The  left main coronary artery demonstrates a 90% stenosis in the distal left  main.   The left anterior descending artery is occluded after the takeoff of first  diagonal branch.   Left circumflex coronary artery is occluded proximally.   The right coronary artery is occluded proximally. There are some bridging  collaterals to the distal right coronary and to the distal circumflex from  the proximal right coronary artery.   The saphenous vein graft to the PDA and posterolateral branch of the right  coronary is occluded.   The saphenous vein graft to the diagonal and obtuse marginal vessel was  occluded.   The LIMA graft to the LAD is a very large graft. It is widely patent with  excellent distal runoff. There is no significant disease in the mid to  distal LAD. This graft also fills a second diagonal branch well. By  collateral flow, there is excellent filling of the obtuse marginal branch of  the left circumflex coronary and of the mid to distal right coronary artery.   Left ventricular angiography was performed in the RAO view. This  demonstrates severe inferior wall hypokinesia to akinesia. There is overall  moderate left ventricular dysfunction with ejection fraction estimated at  40%. There is mild mitral insufficiency.   FINAL INTERPRETATION:  1.  Severe three-vessel obstructive atherosclerotic coronary artery disease.  2.  Patent left internal mammary artery graft to left anterior descending      artery with collateral flow to the left circumflex and right coronary      artery distribution.  3.  Occluded saphenous vein graft to the posterior descending artery and      posterolateral branch of the right coronary artery.  4.  Occluded saphenous vein graft to the diagonal and obtuse marginal      vessel.  5.  Moderate left ventricular dysfunction.  ______________________________  Peter M. Swaziland, M.D.     PMJ/MEDQ  D:  06/16/2005  T:  06/16/2005  Job:  [id number]   cc:   Cassell Clement, M.D.  Fax: [phone]   CVTS

## 2010-09-17 NOTE — H&P (Signed)
NAME:  FLEMING, PRILL NO.:  [id number]   MEDICAL RECORD NO.:  [id number]          PATIENT TYPE:  EMS   LOCATION:  MAJO                         FACILITY:  [facility]   PHYSICIAN:  Quita Skye. Collman, MDDATE OF BIRTH:  1940-05-08   DATE OF ADMISSION:  03/03/2004  DATE OF DISCHARGE:                                HISTORY & PHYSICAL   [name] is a 70 year old white man who is admitted to [facility] for further evaluation of chest pain. The patient has a history of  cardiac disease which dates back to 61. At that time he underwent coronary  artery bypass surgery. The last cardiac catheterization was performed in  2003. This demonstrated an ejection fraction estimated to be approximately  45%. Angiography revealed severe three-vessel and left main coronary artery  disease. The sequential saphenous vein graft to the circumflex and obtuse  marginal was occluded. The sequential vein graft to the PDA and  posterolateral branch was also occluded. The left internal mammary artery  graft to the LAD was patent. Continued medical therapy was recommended.   The patient presents to the emergency department after experiencing an  episode of chest pain this evening. This occurred while he was digging in  the garden. The chest pain is described as a lower substernal ache.  It did  not radiate. It is not associated with dyspnea, diaphoresis, or nausea.  There were no exacerbating or ameliorating factors. It appeared not to be  related to position, meals, or respirations. It ultimately resolved in  approximately one hour. He complains of no chest pain at this time and he  feels well.   There is no history of congestive heart failure or arrhythmia.   The patient has history of hypertension and dyslipidemia. In addition, there  is a strong family history of early coronary artery disease. He stopped  smoking approximately 30 years ago. There is no history of diabetes  mellitus.   The patient's only other medical problems are that of chronic obstructive  pulmonary disease.   The patient is on a number of medications for the aforementioned problem.  These include Xanax, Lipitor, Flonase, Toprol XL, Altace, Plavix, Singulair,  aspirin, isosorbide mononitrate, and doxepin.   MEDICATION ALLERGIES:  SULFA.   OPERATIONS:  1.  Herniorrhaphy.  2.  Tonsillectomy.   The patient lives with his wife. He is disabled due to his heart disease. He  does not drink.   REVIEW OF SYSTEMS:  No new problems related to his head, eyes, ears, nose,  mouth, throat, lungs, gastrointestinal system, genitourinary system, or  extremities. There is no history of neurologic or psychiatric disorder.  There is no history of fever, chills, or weight loss.   PHYSICAL EXAMINATION:  VITAL SIGNS: Blood pressure 128/76, pulse 64 and  regular, respirations 22, temperature 97.4.  GENERAL: The patient is an older white man in no discomfort. He is alert,  oriented, appropriate, and responsive.  HEENT: Normal.  NECK: Without thyromegaly or adenopathy. Carotid pulses are palpable  bilaterally without bruits.  CARDIAC: Normal S1 and S2. There is no S3, S4,  murmur, rub, or click.  Cardiac rhythm is regular. Chest wall tenderness is noted.  LUNGS: Clear.  ABDOMEN: Soft and nontender. There is no mass, hepatosplenomegaly, bruits,  distention, rebound, guarding, or rigidity. Bowel sounds are normal.  RECTAL/GENITALIA EXAM: Not performed as they are not pertinent to the reason  for acute care hospitalization.  EXTREMITIES: Without edema, deviation, or deformity. Radial and dorsalis  pedis pulses palpable bilaterally.  NEUROLOGIC: Brief screening neurologic survey is unremarkable.   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 The chest radiograph, according to the  radiologist, demonstrates frank obstructive pulmonary  disease. The initial  set of cardiac markers revealed a myoglobin of 70.9, CK-MB 1.0, troponin  less than 0.05.  Potassium 4.3, BUN 13, and creatinine 1.1. White count 8.8  with hemoglobin of 12.4, and hematocrit 36.1. The remaining studies are  pending at the time of this dictation.   IMPRESSION:  1.  Chest pain: Rule out unstable angina.  2.  Coronary artery disease: Status post coronary artery bypass graft      surgery in 1991. Last cardiac catheterization in 2003 demonstrated an      estimated ejection fraction of 45% with inferior hypokinesis, severe      three-vessel and left main disease, an occluded saphenous vein graft to      the obtuse marginal and circumflex, and an occluded sequential saphenous      vein graft to the PDA and posterolateral branch, and a patent left      internal mammary artery graft to the left anterior descending.  3.  Hypertension.  4.  Dyslipidemia.  5.  Chronic obstructive pulmonary disease.  6.  Anemia.   PLAN:  1.  Telemetry.  2.  Serial cardiac enzymes.  3.  Aspirin.  4.  Intravenous heparin.  5.  Intravenous nitroglycerin.  6.  Continue Plavix.  7.  Continue Toprol XL.  8.  Further measures pre Dr. Patty Sermons.       MSC/MEDQ  D:  03/03/2004  T:  03/03/2004  Job:  [id number]   cc:   Cassell Clement, M.D.  [address]  [place]  [place] [address]  Fax: [phone]

## 2010-09-17 NOTE — H&P (Signed)
NAME:  [name], [name] NO.:  [id number]   MEDICAL RECORD NO.:  [id number]          PATIENT TYPE:  OBV   LOCATION:  2019                         FACILITY:  [facility]   PHYSICIAN:  Francisca December, M.D.  DATE OF BIRTH:  Sep 23, 1940   DATE OF ADMISSION:  03/18/2006  DATE OF DISCHARGE:                              HISTORY & PHYSICAL   REASON FOR ADMISSION:  Chest pain.   HISTORY OF PRESENT ILLNESS:  Mr. [name] is a 70 year old man with a  long history of ASCVD, status post redo coronary artery bypass graft in  February 2007.  Today while doing his walk toward the end of the  activity, he developed anterior substernal chest discomfort.  It did not  radiate.  It was central in nature.  It was associated with mild dyspnea  but no diaphoresis or nausea.  He returned to his home, rested, noted  his heart rate remained elevated, continued to have chest discomfort,  and called EMS.  Upon arrival of EMS, his heart rate was noted to be 72  beats a minute and his blood pressure 140/86.  En route to [facility] he was given several doses of sublingual nitroglycerin.  He had  also taken one at home.  It did not seem to help much.  He finally  received IV morphine 2 mg and his discomfort resolved.  At the time of  my evaluation now almost 3 hours later, he still has mild discomfort in  the upper sternal region, lower neck which he refers to as choking  more associated with trying to swallow.   Of note, he had seen Dr. Patty Sermons earlier this week with complaints of  chest pain.  Dr. Patty Sermons felt it was not cardiac in nature, more  related to GI or has chronic anxiety syndrome.  It should be noted, the  patient has a long history of GERD with esophageal stricture and  frequent esophageal dilatations.  He does take omeprazole 20 mg twice  daily for this.   PAST MEDICAL HISTORY:  1. ASCVD, multivessel, S/P CABG in 1991 and 2007.  2. Postoperative atrial fibrillation.  No  recurrence.  3. GERD as noted above.  4. Chronic anxiety syndrome.  5. Dyslipidemia.  6. History of seasonal allergies.  7. Hypertension   SOCIAL HISTORY:  [name] does not use alcohol or tobacco products.  He  is accompanied to the emergency room by his daughter and wife today.  He  is retired from [employer].   REVIEW OF SYSTEMS:  Negative except as mentioned above.   CURRENT MEDICATIONS:  1. Simvastatin 80 mg p.o. every day.  2. Doxepin 25 in a.m., 15 in the p.m.  3. Fexofenadine 60 mg p.o. b.i.d.  4. Benazepril 10 mg p.o. b.i.d.  5. Alprazolam 1 mg p.o. t.i.d.  6. Metoprolol 25 mg p.o. b.i.d.  7. Omeprazole 20 mg p.o. b.i.d.   DRUG ALLERGIES:  SULFA CAUSES A RASH.   PHYSICAL EXAMINATION:  VITAL SIGNS:  Her blood pressure is 112/64, pulse  is 62 and regular, respiratory rate 20, temperature 97.3, O2  saturation  on room air 99%.  GENERAL:  This is a well-appearing, comfortable, 70 year old, Caucasian  male, no distress.  HEENT:  Unremarkable.  NECK:  Supple without thyromegaly or masses.  The carotid upstrokes are  normal.  There is no bruit.  There is no JVD.  CHEST:  Clear with adequate excursion.  HEART:  Has a regular rhythm.  Normal S1-S2 is heard.  No S3, S4,  murmur, click or rub noted.  ABDOMEN:  Soft, nontender.  No midline pulsatile mass.  Bowel sounds  present all quadrants.  EXTERNAL GENITALIA:  Normal male phallus, descended testicles.  No  lesions.  RECTAL:  Not performed.  EXTREMITIES:  Show full range of motion.  No edema.  Intact distal  pulses.  NEUROLOGIC:  Cranial nerves II-XII are intact.  Motor and sensory  grossly intact.  Gait not tested.  SKIN:  Warm, dry, and clear.   ACCESSORY CLINICAL DATA:  Electrocardiogram on admission shows a sinus  rhythm, prolonged PR interval, left atrial enlargement, inferior  infarct.   Initial CK-MB, troponin, point-of-care enzymes negative x2, separated by  2 hours.   Chest x-ray:  Pending.    ASSESSMENT:  1. Chest pain.  No evidence of ongoing coronary ischemia, possible      esophageal in nature.  2. Extensive atherosclerotic cardiovascular disease, as discussed      above.  3. Gastroesophageal reflux disease, esophageal stricture, status post      multiple dilatations last June 2007.  4. Chronic anxiety syndrome.  5. Dyslipidemia.  6. Hypertension.  7. Seasonal allergies.   PLAN:  1. The patient will be admitted in an observational status, rule out      MI protocol.  2. Telemetry monitoring.  3. Serial CK, MB, and troponin enzymes x3.  4. Lovenox subcutaneously per pharmacy.  5. Aspirin 325 mg now.  6. A GI cocktail now.  7. Nitroglycerin paste 1 inch applying now and q.6 h.  8. Continue home medications.  We will change Prilosec though to      Protonix 40 mg p.o. b.i.d.      Francisca December, M.D.  Electronically Signed     JHE/MEDQ  D:  03/18/2006  T:  03/18/2006  Job:  [id number]   cc:   Cassell Clement, M.D.

## 2010-09-17 NOTE — Cardiovascular Report (Signed)
NAME:  [name], [name]                   ACCOUNT NO.:  [id number]   MEDICAL RECORD NO.:  [id number]                   PATIENT TYPE:  OIB   LOCATION:  2899                                 FACILITY:  [facility]   PHYSICIAN:  W. Ashley Royalty., M.D.         DATE OF BIRTH:  [birth year]   DATE OF PROCEDURE:  02/11/2002  DATE OF DISCHARGE:                              CARDIAC CATHETERIZATION   HISTORY:  A 70 year old male who has a previous history of coronary bypass  grafting in 1991.  He presents with increasing angina.   COMMENTS ABOUT PROCEDURE:  The patient tolerated the procedure well without  complications.  The OM graft was selected using an AL2 catheter.  An  internal mammary graft catheter was used to select the internal mammary.  He  tolerated the procedure well without complications.  Sheaths were removed  following the procedure.   HEMODYNAMIC DATA:  1. Aorta post contrast 138/71.  2. LV post contrast 138/13.   ANGIOGRAPHIC DATA:  1. Left ventriculogram:  Performed in the 30-degree RAO projection.  The     aortic valve was normal.  The mitral valve was normal.  The left     ventricle appears normal in size.  There is severe hypokinesis of the     inferior wall noted.  The estimated ejection fraction is 45%.  2. Coronary arteries arise and distribute normally, heavily calcified in     their proximal portions.  3. The left main coronary artery is calcified with an eccentric 70-80%     distal narrowing that extends into the LAD.  4. The left anterior descending is calcified and is occluded after a     diagonal branch.  The diagonal branch is diffusely diseased and has a     proximal 60% stenosis.  5. The circumflex coronary artery has severe 90% mid vessel stenosis     following a bend prior to two marginal arteries.  Collateral filling is     seen of the distal right coronary system.  6. Right coronary artery:  Calcified at its ostium and it is occluded  proximally.  7. Sequential saphenous vein graft to the posterior descending and     posterolateral branch is occluded.  8. Sequential vein graft to the OM and circumflex is occluded.  Internal     mammary graft to the LAD is widely patent with a patent distal     anastomotic site.  Collateral filling is seen of the distal posterior     descending artery.   IMPRESSION:  1. Severe native three-vessel coronary artery disease with left main     disease.  2. Occlusion of saphenous vein grafts to circumflex and obtuse marginal     posterior descending, posterolateral branch.  Patent internal mammary     graft to the left anterior descending artery.  3. Abnormal left ventricular function with inferior hypokinesis.  Darden Palmer., M.D.    WST/MEDQ  D:  02/11/2002  T:  02/11/2002  Job:  [id number]   cc:   Cassell Clement, MD

## 2010-09-22 ENCOUNTER — Encounter: Payer: Self-pay | Admitting: Cardiology

## 2010-09-22 DIAGNOSIS — I10 Essential (primary) hypertension: Secondary | ICD-10-CM | POA: Insufficient documentation

## 2010-09-22 DIAGNOSIS — E785 Hyperlipidemia, unspecified: Secondary | ICD-10-CM | POA: Insufficient documentation

## 2010-09-22 DIAGNOSIS — I255 Ischemic cardiomyopathy: Secondary | ICD-10-CM | POA: Insufficient documentation

## 2010-09-22 DIAGNOSIS — F419 Anxiety disorder, unspecified: Secondary | ICD-10-CM | POA: Insufficient documentation

## 2010-09-22 DIAGNOSIS — M797 Fibromyalgia: Secondary | ICD-10-CM | POA: Insufficient documentation

## 2010-09-22 DIAGNOSIS — K219 Gastro-esophageal reflux disease without esophagitis: Secondary | ICD-10-CM | POA: Insufficient documentation

## 2010-09-23 ENCOUNTER — Encounter: Payer: Self-pay | Admitting: Cardiology

## 2010-09-23 ENCOUNTER — Other Ambulatory Visit (INDEPENDENT_AMBULATORY_CARE_PROVIDER_SITE_OTHER): Payer: Medicare Other | Admitting: *Deleted

## 2010-09-23 ENCOUNTER — Ambulatory Visit (INDEPENDENT_AMBULATORY_CARE_PROVIDER_SITE_OTHER): Payer: Medicare Other | Admitting: Cardiology

## 2010-09-23 DIAGNOSIS — Z79899 Other long term (current) drug therapy: Secondary | ICD-10-CM

## 2010-09-23 DIAGNOSIS — E785 Hyperlipidemia, unspecified: Secondary | ICD-10-CM

## 2010-09-23 DIAGNOSIS — F411 Generalized anxiety disorder: Secondary | ICD-10-CM

## 2010-09-23 DIAGNOSIS — F419 Anxiety disorder, unspecified: Secondary | ICD-10-CM

## 2010-09-23 DIAGNOSIS — E7800 Pure hypercholesterolemia, unspecified: Secondary | ICD-10-CM

## 2010-09-23 DIAGNOSIS — Z951 Presence of aortocoronary bypass graft: Secondary | ICD-10-CM | POA: Insufficient documentation

## 2010-09-23 DIAGNOSIS — Z9889 Other specified postprocedural states: Secondary | ICD-10-CM

## 2010-09-23 DIAGNOSIS — I2510 Atherosclerotic heart disease of native coronary artery without angina pectoris: Secondary | ICD-10-CM

## 2010-09-23 LAB — BASIC METABOLIC PANEL
BUN: 10 mg/dL (ref 6–23)
Calcium: 8.9 mg/dL (ref 8.4–10.5)
GFR: 71.07 mL/min (ref >60.00)
Glucose, Bld: 93 mg/dL (ref 70–99)
Potassium: 4.6 mEq/L (ref 3.5–5.1)
Sodium: 132 mEq/L — ABNORMAL LOW (ref 135–145)

## 2010-09-23 LAB — LIPID PANEL
Cholesterol: 130 mg/dL (ref 0–200)
HDL: 38.2 mg/dL — ABNORMAL LOW (ref >39.00)
LDL Cholesterol: 59 mg/dL (ref 0–99)
VLDL: 32.4 mg/dL (ref 0.0–40.0)

## 2010-09-23 LAB — HEPATIC FUNCTION PANEL
AST: 20 U/L (ref 0–37)
Albumin: 3.8 g/dL (ref 3.5–5.2)
Total Bilirubin: 1 mg/dL (ref 0.3–1.2)

## 2010-09-23 NOTE — Assessment & Plan Note (Signed)
The patient has known ischemic heart disease.  He underwent CABG in 1991 and he underwent redo CABG in 2007 and his last cardiac catheterization was in July 2011 showing patent grafts.  His ejection fraction at catheter was 45%.  He has not been expressing any recent chest pain.  He has been under a lot of stress looking after his wife who is an invalid.

## 2010-09-23 NOTE — Progress Notes (Signed)
[name] Date of Birth:  1940/07/08 [facility] [address]   [address] [place], [place]  [address] [phone]           Fax   [phone]  History of Present Illness: This pleasant 70 year old Asian gentleman is seen for a scheduled followup office visit.  He has a history of known ischemic heart disease.  In 1991 he had CABG.  In 2007 he had redo CABG.  In July 2011 he had cardiac catheterization showing that his grafts were patent.  He had an ischemic cardiomyopathy with an ejection fraction of 45% at catheter.  He was hospitalized briefly on 06/21/10 with chest pain and ruled out for myocardial infarction.  Since last visit he's had very little in the way of chest pain.  Has been under a lot of stress taking care of his wife who is an invalid.  Current Outpatient Prescriptions  Medication Sig Dispense Refill  . acetaminophen (TYLENOL) 325 MG tablet Take 650 mg by mouth every 6 (six) hours as needed.        . ALPRAZolam (XANAX) 1 MG tablet Take 1 mg by mouth 2 (two) times daily.        [name] Kitchen aspirin 81 MG EC tablet Take 81 mg by mouth daily.        . benazepril (LOTENSIN) 10 MG tablet Take 10 mg by mouth daily.        . Cholecalciferol (VITAMIN D) 1000 UNITS capsule Take 1,000 Units by mouth daily.        Tery Sanfilippo Sodium (COLACE PO) Take by mouth as needed.        . fexofenadine (ALLEGRA) 60 MG tablet Take 60 mg by mouth as needed.        . isosorbide mononitrate (IMDUR) 60 MG 24 hr tablet Take 60 mg by mouth daily.        . metoprolol (LOPRESSOR) 50 MG tablet Take 50 mg by mouth 2 (two) times daily.        . Multiple Vitamin (MULTIVITAMIN) tablet Take 1 tablet by mouth daily.        . nitroGLYCERIN (NITROSTAT) 0.4 MG SL tablet Place 0.4 mg under the tongue every 5 (five) minutes as needed.        [name] Kitchen omeprazole (PRILOSEC) 20 MG capsule Take 20 mg by mouth 2 (two) times daily.        . simvastatin (ZOCOR) 40 MG tablet Take 40 mg by mouth at bedtime.         . sucralfate (CARAFATE) 1 G tablet Take 1 g by mouth as needed.          Allergies  Allergen Reactions  . Sulfa Drugs Cross Reactors   . Ultram (Tramadol Hcl)     Patient Active Problem List  Diagnoses  . Ischemic cardiomyopathy  . Hypertension  . Hyperlipidemia  . GERD (gastroesophageal reflux disease)  . Anxiety  . Fibromyalgia  . Hx of CABG    History  Smoking status  . Former Smoker  . Quit date: 09/21/1973  Smokeless tobacco  . Not on file    History  Alcohol Use No    Family History  Problem Relation Age of Onset  . Stroke Mother   . Heart attack Father     Review of Systems: Constitutional: no fever chills diaphoresis or fatigue or change in weight.  Head and neck: no hearing loss, no epistaxis, no photophobia or visual disturbance. Respiratory: No  cough, shortness of breath or wheezing. Cardiovascular: No chest pain peripheral edema, palpitations. Gastrointestinal: No abdominal distention, no abdominal pain, no change in bowel habits hematochezia or melena. Genitourinary: No dysuria, no frequency, no urgency, no nocturia. Musculoskeletal:No arthralgias, no back pain, no gait disturbance or myalgias. Neurological: No dizziness, no headaches, no numbness, no seizures, no syncope, no weakness, no tremors. Hematologic: No lymphadenopathy, no easy bruising. Psychiatric: No confusion, no hallucinations, no sleep disturbance.    Physical Exam: Filed Vitals:   09/23/10 0849  BP: 130/80  Pulse: 66  The general appearance reveals a well-developed well-nourished gentleman in no distress.  Pupils equal and reactive.   Extraocular Movements are full.  There is no scleral icterus.  The mouth and pharynx are normal.  The neck is supple.  The carotids reveal no bruits.  The jugular venous pressure is normal.  The thyroid is not enlarged.  There is no lymphadenopathy.The chest is clear to percussion and auscultation. There are no rales or rhonchi. Expansion of the  chest is symmetrical.The precordium is quiet.  The first heart sound is normal.  The second heart sound is physiologically split.  There is no murmur gallop rub or click.  There is no abnormal lift or heave.The abdomen is soft and nontender. Bowel sounds are normal. The liver and spleen are not enlarged. There Are no abdominal masses. There are no bruits.The pedal pulses are good.  There is no phlebitis or edema.  There is no cyanosis or clubbing.Strength is normal and symmetrical in all extremities.  There is no lateralizing weakness.  There are no sensory deficits.The skin is warm and dry.  There is no rash.   Assessment / Plan: Blood work today is pending.  Continue same medication and be rechecked in 4 months for followup office visit and fasting lab work.  He reported that since we saw him he had colonoscopy and upper endoscopy which were normal and he also saw his urologist who gave him a good report on his Prostate gland.

## 2010-09-23 NOTE — Assessment & Plan Note (Signed)
The patient has a history of hypercholesterolemia.  Presently he is on simvastatin 40 mg a day.  He has not had any adverse effects from the simvastatin.

## 2010-09-23 NOTE — Assessment & Plan Note (Signed)
The patient remains very anxious.  He has been very Helpful in caring for his wife who is an invalid.The patient does have supportive children and grandchildren who allow him to get away from his stressful situation for short periods of time.  He is hoping to take a trip to the mountains by himself in the near future.  He remains on doxepin and Xanax which have been helpful.

## 2010-09-24 ENCOUNTER — Telehealth: Payer: Self-pay | Admitting: *Deleted

## 2010-09-24 NOTE — Telephone Encounter (Signed)
Adv. Patient of lab results 

## 2010-09-28 ENCOUNTER — Other Ambulatory Visit: Payer: Self-pay | Admitting: Cardiology

## 2010-09-28 MED ORDER — METOPROLOL TARTRATE 50 MG PO TABS: 50.0000 mg | ORAL_TABLET | Freq: Two times a day (BID) | ORAL | Status: DC

## 2010-09-28 MED ORDER — BENAZEPRIL HCL 10 MG PO TABS: 10.0000 mg | ORAL_TABLET | Freq: Every day | ORAL | Status: DC

## 2010-09-28 MED ORDER — SIMVASTATIN 40 MG PO TABS: 80.0000 mg | ORAL_TABLET | Freq: Every day | ORAL | Status: DC

## 2010-09-28 NOTE — Telephone Encounter (Signed)
Informed scripts being sent in

## 2010-10-15 ENCOUNTER — Telehealth: Payer: Self-pay | Admitting: Cardiology

## 2010-10-15 NOTE — Telephone Encounter (Signed)
Okay to make a trip to the mountains

## 2010-10-15 NOTE — Telephone Encounter (Signed)
Pt wanted to know if he can go to mountains by himself this weekend please call

## 2010-10-15 NOTE — Telephone Encounter (Signed)
Advised 

## 2010-10-15 NOTE — Telephone Encounter (Signed)
Is there any reason he can not do this medically?

## 2010-10-26 ENCOUNTER — Inpatient Hospital Stay (HOSPITAL_COMMUNITY)
Admission: EM | Admit: 2010-10-26 | Discharge: 2010-10-28 | DRG: 310 | Disposition: A | Discharge status: Home / Self Care | Payer: Medicare Other | Attending: Cardiology | Admitting: Cardiology

## 2010-10-26 ENCOUNTER — Emergency Department (HOSPITAL_COMMUNITY): Payer: Medicare Other

## 2010-10-26 DIAGNOSIS — F411 Generalized anxiety disorder: Secondary | ICD-10-CM | POA: Diagnosis present

## 2010-10-26 DIAGNOSIS — I2510 Atherosclerotic heart disease of native coronary artery without angina pectoris: Secondary | ICD-10-CM | POA: Diagnosis present

## 2010-10-26 DIAGNOSIS — K219 Gastro-esophageal reflux disease without esophagitis: Secondary | ICD-10-CM | POA: Diagnosis present

## 2010-10-26 DIAGNOSIS — I519 Heart disease, unspecified: Secondary | ICD-10-CM | POA: Diagnosis present

## 2010-10-26 DIAGNOSIS — IMO0001 Reserved for inherently not codable concepts without codable children: Secondary | ICD-10-CM | POA: Diagnosis present

## 2010-10-26 DIAGNOSIS — I252 Old myocardial infarction: Secondary | ICD-10-CM

## 2010-10-26 DIAGNOSIS — J019 Acute sinusitis, unspecified: Secondary | ICD-10-CM | POA: Diagnosis present

## 2010-10-26 DIAGNOSIS — I10 Essential (primary) hypertension: Secondary | ICD-10-CM | POA: Diagnosis present

## 2010-10-26 DIAGNOSIS — Z951 Presence of aortocoronary bypass graft: Secondary | ICD-10-CM

## 2010-10-26 DIAGNOSIS — I4892 Unspecified atrial flutter: Principal | ICD-10-CM | POA: Diagnosis present

## 2010-10-26 DIAGNOSIS — E785 Hyperlipidemia, unspecified: Secondary | ICD-10-CM | POA: Diagnosis present

## 2010-10-26 LAB — BASIC METABOLIC PANEL
BUN: 11 mg/dL (ref 6–23)
CO2: 27 mEq/L (ref 19–32)
GFR calc non Af Amer: >60 mL/min (ref >60)
Glucose, Bld: 97 mg/dL (ref 70–99)
Potassium: 3.9 mEq/L (ref 3.5–5.1)
Sodium: 130 mEq/L — ABNORMAL LOW (ref 135–145)

## 2010-10-26 LAB — DIFFERENTIAL
Basophils Absolute: 0 10*3/uL (ref 0.0–0.1)
Basophils Relative: 0 % (ref 0–1)
Lymphocytes Relative: 15 % (ref 12–46)
Monocytes Absolute: 0.5 10*3/uL (ref 0.1–1.0)
Monocytes Relative: 6 % (ref 3–12)
Neutro Abs: 7.2 10*3/uL (ref 1.7–7.7)
Neutrophils Relative %: 78 % — ABNORMAL HIGH (ref 43–77)

## 2010-10-26 LAB — CK TOTAL AND CKMB (NOT AT ARMC)
CK, MB: 2.4 ng/mL (ref 0.3–4.0)
Total CK: 70 U/L (ref 7–232)

## 2010-10-26 LAB — POCT I-STAT, CHEM 8
Calcium, Ion: 1.18 mmol/L (ref 1.12–1.32)
Chloride: 97 mEq/L (ref 96–112)
HCT: 44 % (ref 39.0–52.0)
Sodium: 132 mEq/L — ABNORMAL LOW (ref 135–145)

## 2010-10-26 LAB — CBC
HCT: 40 % (ref 39.0–52.0)
Hemoglobin: 13.8 g/dL (ref 13.0–17.0)
MCH: 31.1 pg (ref 26.0–34.0)
MCHC: 34.5 g/dL (ref 30.0–36.0)
RBC: 4.44 MIL/uL (ref 4.22–5.81)

## 2010-10-27 DIAGNOSIS — I519 Heart disease, unspecified: Secondary | ICD-10-CM

## 2010-10-27 LAB — CARDIAC PANEL(CRET KIN+CKTOT+MB+TROPI)
CK, MB: 2.1 ng/mL (ref 0.3–4.0)
CK, MB: 2.1 ng/mL (ref 0.3–4.0)
Relative Index: INVALID (ref 0.0–2.5)
Relative Index: INVALID (ref 0.0–2.5)
Total CK: 54 U/L (ref 7–232)
Troponin I: <0.3 ng/mL (ref <0.30)
Troponin I: <0.3 ng/mL (ref <0.30)

## 2010-10-27 LAB — CBC
Platelets: 155 10*3/uL (ref 150–400)
RBC: 4.31 MIL/uL (ref 4.22–5.81)
RDW: 12.1 % (ref 11.5–15.5)
WBC: 6.8 10*3/uL (ref 4.0–10.5)

## 2010-10-27 LAB — BASIC METABOLIC PANEL
CO2: 31 mEq/L (ref 19–32)
Chloride: 99 mEq/L (ref 96–112)
Creatinine, Ser: 0.91 mg/dL (ref 0.50–1.35)
GFR calc Af Amer: >60 mL/min (ref >60)
Potassium: 4.4 mEq/L (ref 3.5–5.1)
Sodium: 135 mEq/L (ref 135–145)

## 2010-10-27 LAB — TSH: TSH: 0.623 u[IU]/mL (ref 0.350–4.500)

## 2010-10-29 ENCOUNTER — Telehealth: Payer: Self-pay | Admitting: Internal Medicine

## 2010-10-29 DIAGNOSIS — I4892 Unspecified atrial flutter: Secondary | ICD-10-CM

## 2010-10-29 NOTE — Telephone Encounter (Signed)
Pt. States he is scheduled for a procedure with Dr, Graciela Husbands on July 9 th and labs July 2 Nd. We do not have any records on this pt  been seen in this office. Dr. Danielle Rankin at [facility] is pt's primary cardiology. Pt. Was seen in the hospital by Dr. Graciela Husbands. Gypsy Balsam RN was called for information. Amber verified that pt is scheduled for an ablation on July 9 th 2012. Pt. Will come for labs to [facility] office . An appointment was made for labs on July 2 Nd 2012 for BMET, CBC, PT,PTT Patient aware.

## 2010-10-29 NOTE — Telephone Encounter (Signed)
Pt calling with questions re ablation 7-9, lab work, where to go.[name] Kitchen

## 2010-10-29 NOTE — Discharge Summary (Addendum)
NAME:  [name], [name] NO.:  [id number]  MEDICAL RECORD NO.:  [id number]  LOCATION:  2034                         FACILITY:  [name]  PHYSICIAN:  [name], M.D. DATE OF BIRTH:  1940-07-26  DATE OF ADMISSION:  10/26/2010 DATE OF DISCHARGE:  10/28/2010                              DISCHARGE SUMMARY   DISCHARGE DIAGNOSES: 1. Newly diagnosed atrial flutter with rapid ventricular response October 26, 2010.     a.     Waiting atrial flutter ablation November 08, 2010, at 7:30 a.m.      at [name].     b.     Reported history of postoperative atrial fibrillation in the      past. 2. Subacute sinusitis, prescribed azithromycin at discharged. 3. Coronary artery disease.     a.     Status post coronary artery bypass grafting in 1991 with      left internal mammary artery to the left anterior descending,      saphenous vein graft to obtuse marginal and circumflex, saphenous      vein graft to the posterior descending artery and posterolateral      artery.     b.     Status post redo coronary artery bypass graft x3 with      saphenous vein graft to the obtuse marginal, sequential saphenous      vein graft to the posterior descending artery and posterolateral      artery in February 2007.     c.     Last catheterization July 2011 showing left anterior      descending, circumflex and right coronary artery total with patent      graft. 4. Left ventricular dysfunction with ejection fraction of 40-45% by     echo October 27, 2010. 5. Hypertension. 6. Hyperlipidemia.. 7. Gastroesophageal reflux disease. 8. Anxiety. 9. Fibromyalgia. 10.Status post hernia repair. 11.Status post tonsillectomy.  [name] COURSE:  [name] is a 70 year old gentleman with a history of known coronary artery disease and LV dysfunction who presented to [facility] with complaints of an usual sensation in his chest accompanied by some dizziness.  He did not have chest  pain per se, but overall felt funny.  He went to the fire station, was found to have a heart rate of 140 and was brought in by [name].  EKG by EMS revealed atrial flutter with rapid ventricular response.  He was in sinus rhythm by the time that he was seen by Cardiology, who was asked to admit the patient.  Cardiac enzymes were cycled which remained negative x3.  His sodium was slightly low at 130 on admission, went to 135.  Two-D echocardiogram was checked which demonstrated LV dysfunction with an EF of 40-45% with akinesis of the entire inferior myocardium. There was grade 2 diastolic dysfunction and left atrium was mildly dilated.  This EF correlated with a prior echo in 2008.  The patient was seen in consultation by [name] with Electrophysiology and felt to have a typical atrial flutter and thus flutter ablation was scheduled as an outpatient on July 9 at 7:30 a.m.  Instructions  were reviewed with the patient by Brett Noe, [name], for care of coordination when EP. Today, the patient is feeling well.  [name] has noted that he has a subacute sinusitis and has given him a prescription for azithromycin. [name] has seen and examined the patient today and feels he is stable for discharge.  DISCHARGE LABS:  WBC 6.8, hemoglobin 13.7, hematocrit 39.1, platelet count 155.  Sodium 135, potassium 4.4, chloride 99, CO2 31, glucose 91, BUN 12, creatinine 0.91.  Cardiac enzymes negative x3 and TSH is 0.63.  STUDIES: 1. Chest x-ray on October 26, 2010, showed stable exam.  No active     disease. 2. A 2-D echocardiogram, please see HPI for findings.  DISCHARGE MEDICATIONS: 1. Azithromycin 250 mg 2 tablets day 1, then 1 tablet on day 2, 3, 4,     and 5. 2. Tylenol 500 mg daily as needed for pain or fever. 3. Aspirin 81 mg daily. 4. Benazepril 10 mg daily. 5. Calcium carbonate/vitamin D one tablet daily. 6. Cyclobenzaprine 10 mg b.i.d. as needed for pain. 7. Doxepin  25 mg t.i.d. 8. Fexofenadine 60 mg 1 tablet daily as needed for allergies. 9. Guaifenesin 400 mg t.i.d. 10.Imdur 60 mg daily. 11.Metoprolol tartrate 50 mg b.i.d. 12.Multivitamin 1 tablet daily. 13.Nitro sublingual 0.4 mg every 5 minutes as needed up to 3 doses for     chest pain. 14.Omeprazole 20 mg b.i.d. 15.Senna/docusate 8.6/50 mg daily as needed for constipation. 16.Simvastatin 40 mg nightly. . 17.Xanax 1 mg t.i.d. p.r.n. anxiety.  DISPOSITION:  [name] will be discharged in stable condition to home.  He is to increase activity slowly and follow a low-sodium heart- healthy diet.  He will have lab work at [facility] on November 01, 2010, which has already been arranged by [employer].  He is scheduled for an atrial flutter ablation November 08, 2010, at 7:30 and is to arrive at short stay at 5:30 a.m. the day of his procedure.  In the interim if he did felt any problems, he is instructed to call or return.  DURATION OF DISCHARGE ENCOUNTER:  Greater than 30 minutes including physician and PA time.     [name], P.A.C.   ______________________________ [name], M.D.    DD/MEDQ  D:  10/28/2010  T:  10/28/2010  Job:  [id number]  Electronically Signed by [name] M.D. on 10/29/2010 03:30:00 PM Electronically Signed by [name]  on 11/04/2010 01:32:31 PM

## 2010-11-01 ENCOUNTER — Other Ambulatory Visit (INDEPENDENT_AMBULATORY_CARE_PROVIDER_SITE_OTHER): Payer: Medicare Other | Admitting: *Deleted

## 2010-11-01 DIAGNOSIS — I4892 Unspecified atrial flutter: Secondary | ICD-10-CM

## 2010-11-01 DIAGNOSIS — R0789 Other chest pain: Secondary | ICD-10-CM

## 2010-11-01 LAB — CBC WITH DIFFERENTIAL/PLATELET
Basophils Absolute: 0 10*3/uL (ref 0.0–0.1)
Basophils Relative: 0.3 % (ref 0.0–3.0)
Eosinophils Absolute: 0.3 10*3/uL (ref 0.0–0.7)
Lymphocytes Relative: 29 % (ref 12.0–46.0)
MCHC: 33.6 g/dL (ref 30.0–36.0)
Monocytes Relative: 5.8 % (ref 3.0–12.0)
Neutrophils Relative %: 59.9 % (ref 43.0–77.0)
RBC: 4.46 Mil/uL (ref 4.22–5.81)

## 2010-11-01 LAB — BASIC METABOLIC PANEL
CO2: 24 mEq/L (ref 19–32)
Calcium: 8.6 mg/dL (ref 8.4–10.5)
Creatinine, Ser: 1 mg/dL (ref 0.4–1.5)
GFR: 82.26 mL/min (ref >60.00)
Sodium: 130 mEq/L — ABNORMAL LOW (ref 135–145)

## 2010-11-01 LAB — PROTIME-INR: INR: 0.9 ratio (ref 0.8–1.0)

## 2010-11-01 LAB — APTT: aPTT: 27.3 s (ref 21.7–28.8)

## 2010-11-02 NOTE — Consult Note (Signed)
NAME:  [name], [name] NO.:  [id number]  MEDICAL RECORD NO.:  [id number]  LOCATION:  2034                         FACILITY:  [facility]  PHYSICIAN:  Duke Salvia, MD, FACCDATE OF BIRTH:  08/17/1940  DATE OF CONSULTATION:  10/27/2010 DATE OF DISCHARGE:                                CONSULTATION   Thank you very much for asking [place] to see Mr. [name] in consultation because of atrial flutter.  The patient is a 70 year old gentleman who presented to the hospital via EMS yesterday because of spell that occurred while visiting with his daughter at the bank.  He had initial onset of lightheadedness.  This was accompanied by chest tightness and an unusual sensation that he cannot well described.  He does not recall palpitations or dyspnea on exertion.  He was noted being quite pale.  Because of these persistent symptoms, he went to the local pharmacy where his blood pressure was recorded at 170/120 and his pulse was recorded at 114 or so.  EMS was called and en route to the hospital, his atrial flutter associated with a 2:1 conduction and rapid response terminated.  He has a history of coronary artery disease.  He underwent bypass surgery initially in 1991 and then in 2007.  Apparently, he had postoperative atrial fibrillation at that time.  Most recent left ventricular function was assessed today by echo, ejection fraction was 40-45% with __________, moderate left atrial dilatation with a size of 50 mm and mild right ventricular dilatation, there is trivial MR and no aortic stenosis.  Thromboembolic risk factors are notable for age, hypertension, and vascular disease for CHADS2-VASc score of 3.  He denies transient neurological symptoms or diabetes.  He has no significant impairment in exercise tolerance.  He normally has no exercise intolerance.  He denies palpitations.  Cardiac evaluation has also included a catheterization done about a year ago because  of prolonged episodes of substernal chest pain. Catheterization demonstrated patent grafts with a LIMA to his LAD, vein graft to OM-1 and to the RCA with EF as noted previously.  PAST MEDICAL HISTORY:  In addition to the above is notable for: 1. Hypertension. 2. Hyperlipidemia. 3. GE reflux disease. 4. Anxiety. 5. Fibromyalgia.  PAST SURGICAL HISTORY:  Notable for cataract surgery, tonsillectomy, herniorrhaphy, laparoscopic cholecystectomy, and esophageal dilatations.  REVIEW OF SYSTEMS:  In addition to the above is notable for short-term memory loss.  Skin cancers and an elevated PSA.  SOCIAL HISTORY:  He is married.  His wife is disabled with diabetic neuropathy and Charcot joints.  He does not use cigarettes, alcohol, or recreational drugs.  FAMILY HISTORY:  Notable for coronary artery disease.  MEDICATIONS:  As an outpatient included aspirin, benazepril, doxepin, Allegra, Imdur 60, metoprolol 50 b.i.d., omeprazole, and simvastatin 40.  ALLERGIES:  Allergic to SULFA.  PHYSICAL EXAMINATION:  GENERAL:  He is an elderly Caucasian male, appearing his stated age of 66.  He was in no acute distress. VITAL SIGNS:  His blood pressure is 124/64.  His pulse was 69.  He was afebrile.  His respirations were 18. HEENT:  Normal. NECK:  His neck veins are flat.  His carotids are brisk  and full bilaterally without bruits.  There is no lymphadenopathy. BACK:  Without kyphosis. LUNGS:  Clear. HEART:  Sounds are regular without murmurs or gallops. ABDOMEN:  Soft with midline pulsation or hepatomegaly.  There was a small umbilical hernia. EXTREMITIES:  Femoral pulses are 2+.  Distal pulses are intact.  There is no clubbing, cyanosis, or edema. NEUROLOGIC:  Grossly normal, although he did not remember lots of things and he admits to short-term memory loss.  Motor and sensory functions are grossly normal. SKIN:  Warm and dry.  Laboratories are notable for normal metabolic profile, normal  hemogram, ruled out for myocardial infarction.  TSH was 0.623.  Electrocardiogram at baseline demonstrates sinus rhythm at 74 with intervals of 0.35/0.11/0.37, the axis was leftward -19. Electrocardiogram from June 22, 2010, also demonstrates sinus rhythm with first-degree AV block at intervals of 0.32/0.12/0.42.  There is evidence of a previous inferior wall MI.  Tracings from EMS are most consistent with flutter, most notably apparent with a PVC unmasking the flutter waves.  Ventricular rate was 138.  IMPRESSION: 1. Atrial flutter - typical. 2. History of postoperative atrial fibrillation, but none since,     although the patient has had no palpitations with atrial flutter. 3. Thromboembolic risk factors notable for hypertension, age, and     vascular disease for CHA2DS2-VASc Score 3. 4. Coronary artery disease.     a.     Prior bypass and redo bypass in 2007.     b.     Patent grafts in 2011.     c.     Ejection fraction of 40-45%.     d.     Prior inferior wall myocardial infarction. 5. Gastroesophageal reflux disease. 6. Significant first-degree atrioventricular block.  DISCUSSION:  Mr. Scarantino has had an episode of atrial flutter.  He has thromboembolic risk factors that would make it appropriate for him to take long-term anticoagulation.  We have discussed the potential benefits and alternatives to this.  In this latter category, we would be including catheter ablation with elimination of the substrate for his atrial flutter.  As an alternative, we would consider catheter ablation. Thromboembolic risk factors justifying anticoagulation make the alternative catheter ablation quite appealing especially given the patient's paucity of palpitations.  We have discussed potential lap procedure as well as its complications including but not limited to death and heart block requiring pacemaker implantation.  I think the likelihood of aggravation of his conduction system  disease is quite small.  Hence, I would recommend: 1. Anticipate discharge tomorrow. 2. Anticipate scheduling his outpatient flutter ablation in the next     number of weeks as the time is convenient for him and his ill wife.     Continue aspirin for now.  Thank you for the consultation.     Duke Salvia, MD, [facility]     SCK/MEDQ  D:  10/27/2010  T:  10/28/2010  Job:  [id number]  Electronically Signed by Sherryl Manges MD [facility] on 11/02/2010 04:36:20 PM

## 2010-11-04 ENCOUNTER — Telehealth: Payer: Self-pay | Admitting: Cardiology

## 2010-11-04 ENCOUNTER — Telehealth: Payer: Self-pay | Admitting: Internal Medicine

## 2010-11-04 NOTE — Telephone Encounter (Signed)
Finished antibiotic Monday.  Taking mussinex now.  Has cough.  Please call.

## 2010-11-04 NOTE — Telephone Encounter (Signed)
I spoke with the patient. He states he finished his z-pack on Monday, but he still has a cough. He had been hoarse, but that is better. He is coughing up very little and is afebrile. I explained I will review with Dr. Graciela Husbands and call him back tomorrow regarding his procedure for Monday. He is agreeable.

## 2010-11-04 NOTE — Telephone Encounter (Signed)
Pt has procedure scheduled and was to call back if he still had a cough, and he does, still taking mucinex , what to do?

## 2010-11-04 NOTE — Telephone Encounter (Signed)
Denies fever, just cough.  Advised to continue mucinex and that antibiotic still working in system (Zpak).  If fever develops advised he needs to see someone, may need chest xray.

## 2010-11-05 NOTE — Telephone Encounter (Signed)
Pt called again wanting to know about his ablation on Monday and him being some what sick.  Please call him as soon as you know the answer.

## 2010-11-05 NOTE — Telephone Encounter (Signed)
Per Dr. Graciela Husbands, if the patient is still coughing at all, we need to cancel his ablation for Monday. I have made the patient aware and I will call him back around the middle of next week to f/u on his cough. The EP lab is aware to cancel for Monday.

## 2010-11-09 ENCOUNTER — Telehealth: Payer: Self-pay | Admitting: Cardiology

## 2010-11-09 NOTE — Telephone Encounter (Signed)
Denies fever.  Can hear congestion and feels like it is in throat.  Has been on mucinex for about 13 days, finished zpak last week.  Nothing coming up anymore.  Feels tired and run down.  Hurts when he breaths.  Please advise.  Feels like local doctor wouldn't given him anything unless he has a fever.  Please advised

## 2010-11-09 NOTE — Telephone Encounter (Signed)
Called because he was dissatified with the diagnosis that his primary care doctor had given him. He has a nasty cough and feels like he has a lump in his chest and he can't get it up. He has been on muscinex for the past two weeks and doesn't feel any better. His primary care won't seen him unless he begins to run a fever and he just wants some advice. He knows that this isn't Dr. Yevonne Pax field but he doesn't know where to turn. He wants to know if he should go for a chest X-ray. Please call back. I have pulled his chart.

## 2010-11-09 NOTE — Telephone Encounter (Signed)
He just had a chest x-ray on 10/26/10 so it on continues another one if he is not running a fever.  I would suggest continuing Mucinex and if he fails to improve he might want to see a ENT doctor about his throat congestion.

## 2010-11-10 NOTE — Telephone Encounter (Signed)
Denies any fever still, will call his ENT

## 2010-11-12 ENCOUNTER — Telehealth: Payer: Self-pay | Admitting: Cardiology

## 2010-11-12 NOTE — Telephone Encounter (Signed)
Patient phoned twice.  The first time he called because his blood pressure was 181/85 and he was having a numb/tingling feeling all over and EMS had just arrived.  I advised to let EMS evaluate him.  He called back shortly and everything "checked out ok".  Spoke to EMS and was told blood pressure 158/70, HR 70 O2 sat 98%, EKG no ST changes.  Had taken 2 NTG and had a headache.  Neuro evaluation not indicative of a stroke.  Spoke back with the patient and he was not sure if he was going to go to the hospital or what.  Did tell him if he wanted to be seen I could try to get him in to see Dr [place], Dr Patty Sermons not in the office.  Patient stated he just felt numb/tingly all over.  Patient was to decided and call back

## 2010-11-15 ENCOUNTER — Encounter: Payer: Self-pay | Admitting: Cardiology

## 2010-11-15 ENCOUNTER — Ambulatory Visit (INDEPENDENT_AMBULATORY_CARE_PROVIDER_SITE_OTHER): Payer: Medicare Other | Admitting: Cardiology

## 2010-11-15 ENCOUNTER — Other Ambulatory Visit: Payer: Self-pay | Admitting: *Deleted

## 2010-11-15 VITALS — BP 148/82 | HR 68 | Wt 196.0 lb

## 2010-11-15 DIAGNOSIS — R05 Cough: Secondary | ICD-10-CM

## 2010-11-15 DIAGNOSIS — I119 Hypertensive heart disease without heart failure: Secondary | ICD-10-CM

## 2010-11-15 DIAGNOSIS — R053 Chronic cough: Secondary | ICD-10-CM

## 2010-11-15 DIAGNOSIS — I4892 Unspecified atrial flutter: Secondary | ICD-10-CM | POA: Insufficient documentation

## 2010-11-15 DIAGNOSIS — I10 Essential (primary) hypertension: Secondary | ICD-10-CM

## 2010-11-15 MED ORDER — AMLODIPINE BESYLATE 5 MG PO TABS: 5.0000 mg | ORAL_TABLET | Freq: Every day | ORAL | Status: DC

## 2010-11-15 NOTE — Assessment & Plan Note (Signed)
The patient has had a persistent nonproductive cough.  He has had several rounds of Zithromax.  He saw an ENT physician at [facility] today who looked down into his throat and could not find anyEtiology of the cough.The ENT physician recommended another course of Zithromax.  His cough has been delaying the scheduling of his ablation for atrial flutter.  The patient has been on an ACE inhibitor, benazepril, long-term and it is possible that the benazepril is contributing to his cough.  We are going to stop his benazepril

## 2010-11-15 NOTE — Telephone Encounter (Signed)
Patient never called back on Friday, walked in today with elevated blood pressure, will see  Dr. Patty Sermons

## 2010-11-15 NOTE — Assessment & Plan Note (Signed)
The patient has a past history of paroxysmal atrial flutter.  During his recent hospitalization for the atrial flutter he was seen by Dr. Graciela Husbands who recommended ablation.  The ablation procedure had to be rescheduled because of the patient's persistent cough.  Will follow with stopping of his benazepril the cough may improve and the ablation be rescheduled

## 2010-11-15 NOTE — Assessment & Plan Note (Addendum)
The patient has a history of essential hypertension.  He went to see an ENT physician today and his blood pressure was high in fat office in the range of 170/90 and he was advised to come here to be checked.  On our exam his pressure was 148/82.  The patient has been on long-term been on Cipro.  The benazepril could be contributing to his cough and we will stop it and in its place we will use amlodipine 5 mg one daily.

## 2010-11-15 NOTE — Progress Notes (Signed)
[name] Date of Birth:  Jul 31, 1940 [name] / [facility] [address]   [address] [place], [place]  [address] [phone]           Fax   [phone]  History of Present Illness: This pleasant 70 year old gentleman is seen for a Walk-in work in office visit.  He was at his ENT physician for evaluation of a chronic nonproductive cough and at that office his blood pressure was running high and he was advised to be rechecked here.  The patient has a history of known ischemic heart disease and had coronary artery bypass graft surgery in 1991 with a left internal mammary artery to the LAD, saphenous vein graft to the obtuse marginal and circumflex, saphenous vein graft to posterior descending artery and posterolateral artery.  The patient underwent a redo coronary artery bypass graft surgery x3 in February 2007 with saphenous vein graft to the obtuse marginal and sequential saphenous vein graft to the posterior descending and posterior lateral arteries.  His last cardiac catheterization in 2011 July showed patent grafts.  His ejection fraction by catheterization was 45%.  The patient has had frequent atypical chest pain.  He had a recent hospitalization brought on by paroxysmal atrial flutter.  He's had a history of hypertension hyperlipidemia gastroesophageal reflux disease anxiety fibromyalgia and is status post hernia repair and status post tonsillectomy.  Current Outpatient Prescriptions  Medication Sig Dispense Refill  . acetaminophen (TYLENOL) 325 MG tablet Take 650 mg by mouth every 6 (six) hours as needed.        . ALPRAZolam (XANAX) 1 MG tablet Take 1 mg by mouth 2 (two) times daily.        [name] Kitchen aspirin 81 MG EC tablet Take 81 mg by mouth daily.        . Cholecalciferol (VITAMIN D) 1000 UNITS capsule Take 1,000 Units by mouth daily.        Tery Sanfilippo Sodium (COLACE PO) Take by mouth as needed.        . fexofenadine (ALLEGRA) 60 MG tablet Take 60 mg by mouth as needed.         . isosorbide mononitrate (IMDUR) 60 MG 24 hr tablet Take 60 mg by mouth daily.        . metoprolol (LOPRESSOR) 50 MG tablet Take 1 tablet (50 mg total) by mouth 2 (two) times daily.  180 tablet  3  . Multiple Vitamin (MULTIVITAMIN) tablet Take 1 tablet by mouth daily.        . nitroGLYCERIN (NITROSTAT) 0.4 MG SL tablet Place 0.4 mg under the tongue every 5 (five) minutes as needed.        [name] Kitchen omeprazole (PRILOSEC) 20 MG capsule Take 20 mg by mouth 2 (two) times daily.        . simvastatin (ZOCOR) 40 MG tablet Take 2 tablets (80 mg total) by mouth at bedtime.  90 tablet  3  . sucralfate (CARAFATE) 1 G tablet Take 1 g by mouth as needed.        [name] Kitchen amLODipine (NORVASC) 5 MG tablet Take 1 tablet (5 mg total) by mouth daily.  30 tablet  11    Allergies  Allergen Reactions  . Sulfa Drugs Cross Reactors   . Ultram (Tramadol Hcl)     Patient Active Problem List  Diagnoses  . Ischemic cardiomyopathy  . Hypertension  . Hyperlipidemia  . GERD (gastroesophageal reflux disease)  . Anxiety  . Fibromyalgia  . Hx of CABG  .  Cough, persistent  . Paroxysmal atrial flutter    History  Smoking status  . Former Smoker  . Quit date: 09/21/1973  Smokeless tobacco  . Not on file    History  Alcohol Use No    Family History  Problem Relation Age of Onset  . Stroke Mother   . Heart attack Father     Review of Systems: Constitutional: no fever chills diaphoresis or fatigue or change in weight.  Head and neck: no hearing loss, no epistaxis, no photophobia or visual disturbance. Respiratory: Persistent nonproductive cough Cardiovascular: No chest pain peripheral edema, palpitations. Gastrointestinal: No abdominal distention, no abdominal pain, no change in bowel habits hematochezia or melena. Genitourinary: No dysuria, no frequency, no urgency, no nocturia. Musculoskeletal:No arthralgias, no back pain, no gait disturbance or myalgias. Neurological: No dizziness, no headaches, no  numbness, no seizures, no syncope, no weakness, no tremors. Hematologic: No lymphadenopathy, no easy bruising. Psychiatric: No confusion, no hallucinations, no sleep disturbance.    Physical Exam: Filed Vitals:   11/15/10 1152  BP: 148/82  Pulse: 68  The general appearance feels a well-developed anxious gentleman in no acute distress.Pupils equal and reactive.   Extraocular Movements are full.  There is no scleral icterus.  The mouth and pharynx are normal.  The neck is supple.  The carotids reveal no bruits.  The jugular venous pressure is normal.  The thyroid is not enlarged.  There is no lymphadenopathy.The chest is clear to percussion and auscultation. There are no rales or rhonchi. Expansion of the chest is symmetrical.The precordium is quiet.  The first heart sound is normal.  The second heart sound is physiologically split.  There is no murmur gallop rub or click.  There is no abnormal lift or heave.The abdomen is soft and nontender. Bowel sounds are normal. The liver and spleen are not enlarged. There Are no abdominal masses. There are no bruits. Normal extremity without phlebitis or edema.Strength is normal and symmetrical in all extremities.  There is no lateralizing weakness.  There are no sensory deficits.  EKG today shows normal sinus rhythm with first degree AV block and a pattern of an old inferior wall MI and the tracing is unchanged from the previous available tracing of 06/22/10  Assessment / Plan: We will stop his benazepril and see if his cough improves.  To take the place of benazepril we will add amlodipine 5 mg daily for blood pressure.  He will be rechecked here in 3 months for followup office visit or sooner p.r.n.

## 2010-11-16 ENCOUNTER — Telehealth: Payer: Self-pay | Admitting: Cardiology

## 2010-11-16 ENCOUNTER — Encounter: Payer: Self-pay | Admitting: Cardiology

## 2010-11-16 NOTE — Telephone Encounter (Signed)
recvd call from patient, needs clarification of  Amlodipine bystolic 5 mg, does it replace one of his meds and how does he take.  Please call.

## 2010-11-16 NOTE — Telephone Encounter (Signed)
Advised patient to stop benazepril and start amlodipine

## 2010-11-17 ENCOUNTER — Telehealth: Payer: Self-pay | Admitting: *Deleted

## 2010-11-17 DIAGNOSIS — I4892 Unspecified atrial flutter: Secondary | ICD-10-CM

## 2010-11-17 NOTE — Telephone Encounter (Signed)
I spoke with the patient to f/u on his cough. His a-flutter ablation has been postponed since 11/08/10 due to this cough. He states today that he was seen by an ENT on Monday and was started on a z-pack. He states he is not coughing all the time, but will have a flare up. I explained I would make Dr. Graciela Husbands aware, since we cannot do his ablation with the cough. I explained that in talking with Dr. Graciela Husbands, he was not going to put him on any blood thinners as of last week. I will forward to Dr. Graciela Husbands to see if he has any recommendations or do we need to do anything different with the patient, or just leave everything the same for now until his cough is totally resolved.

## 2010-11-17 NOTE — Telephone Encounter (Signed)
Agree thks s

## 2010-11-18 NOTE — H&P (Signed)
NAME:  [name], [name] NO.:  [id number]  MEDICAL RECORD NO.:  [id number]  LOCATION:  2034                         FACILITY:  [facility]  PHYSICIAN:  Vesta Mixer, M.D. DATE OF BIRTH:  [birth year]  DATE OF ADMISSION:  10/26/2010 DATE OF DISCHARGE:                             HISTORY & PHYSICAL   Mr. [place] is a 70 year old gentleman with a history of coronary artery disease, gastroesophageal reflux disease, hypertension, hyperlipidemia, and postoperative atrial fibrillation.  He is admitted to the hospital after having an episode of atrial flutter with rapid ventricular response.  Mr. Schwager was recently admitted to the hospital 3-4 months ago with an episode of chest pain.  He has an extensive history of coronary artery bypass grafting and had bypass grafting in 1991 and again in 2007.  During that admission, he ruled out for myocardial infarction and was started on isosorbide mononitrate.  He has done well since that time and has not had any episodes of angina.  The patient has been in the usual state health.  Today, he was sitting at the bank talking to the bank teller.  He started having a very unusual sensation in the chest and felt very dizzy.  He felt somewhat faint and thought maybe it was due to the fact that he had not eaten awhile.  He went to the [facility], but could not eat anything because he felt so poorly.  He did not really have any chest pain per se, but did feel funny in his chest.  He went over to the fire station and was found to have a heart rate of 140.  He was brought in by [facility].  There EKG reveals atrial flutter with rapid ventricular response.  The patient is converted to sinus rhythm by the time I am seeing him. He feels quite a bit better.  The patient denies any alcohol intake recently.  He denies any fevers or chills.  He denies any GI bleed.  He denies any other aches or pains. He denies any PND or orthopnea.   He denies any nausea, vomiting, diarrhea, or any other viral illness.  He does have some occasional congestion that he thinks is due to allergies.  The patient has had some atrial fibrillation in the past.  This was primarily postoperatively after one of his surgeries.  He has not had any significant history of atrial fibrillation or atrial flutter.  His current medications include: 1. Aspirin 81 mg a day. 2. Benazepril 10 mg a day. 3. Calcium and Colace once a day. 4. Doxepin once a day. 5. Allegra as needed. 6. Imdur 60 mg a day. 7. Metoprolol 50 mg p.o. b.i.d. 8. Omeprazole 20 mg a day. 9. Simvastatin 40 mg a day.  He is allergic to SULFA medications.  PAST MEDICAL HISTORY: 1. Coronary artery disease - status post coronary artery bypass     grafting. 2. Postoperative atrial fibrillation. 3. Hypertension. 4. Hyperlipidemia. 5. History of gastroesophageal reflux disease. 6. Anxiety. 7. Fibromyalgia.  SOCIAL HISTORY:  The patient has a history of smoking in the very remote past.  He does not drink alcohol.  FAMILY HISTORY:  Positive for coronary  artery disease in his father.  REVIEW OF SYSTEMS:  As noted in the HPI.  All other review of systems were reviewed and are essentially negative.  PHYSICAL EXAMINATION:  GENERAL:  He is an elderly gentleman, in no acute distress.  He is quite comfortable presently. VITAL SIGNS:  His blood pressure is 139/64 with a heart rate of 64. HEENT:  2+ carotids.  His sclerae are nonicteric.  His mucous membranes are moist. LUNGS:  Clear. BACK:  Nontender. HEART:  Regular rate, S1 and S2.  He has no murmurs, gallops, or rubs. The ER room was quite noisy, so subtle murmurs may not be appreciated. His PMI was nondisplaced. ABDOMEN:  Good bowel sounds and nontender. EXTREMITIES:  He has no clubbing, cyanosis, or edema.  There are no palpable cords.  His pulses are intact.  His EKG #1 reveals atrial flutter with rapid ventricular  response.  His present EKG reveals normal sinus rhythm with a first-degree AV block.  LABORATORY DATA:  His cardiac enzymes are negative x1.  His white blood cell count is 9.3, hematocrit is 40, hemoglobin is 13.8, and platelet counts 177.  His sodium is 132, potassium is 4.1, chloride is 97, glucose is 105, BUN is 11, and creatinine is 1.2.  Mr. Rao presents with what appears to be an episode of atrial flutter.  It is difficult to tell in this 12-lead EKG, but I suspect that he really did have some atrial flutter.  He was not active at that time and this episode was not brought on by any exercise.  I do not have the conversion from atrial flutter to sinus rhythm.  I suspect that this happened while he was en route to the emergency room.  We will admit him.  We will check cardiac enzymes since he has an extensive history of coronary artery disease.  We will have him see one of the electrophysiologist tomorrow.  We will get an echocardiogram.  We will continue the same medications, otherwise.     Vesta Mixer, M.D.     PJN/MEDQ  D:  10/26/2010  T:  10/27/2010  Job:  [id number]  cc:   Hillis Range, MD  Electronically Signed by Kristeen Miss M.D. on 11/18/2010 02:45:25 PM

## 2010-11-19 ENCOUNTER — Telehealth: Payer: Self-pay | Admitting: Cardiology

## 2010-11-19 NOTE — Telephone Encounter (Signed)
Has a question regarding her father not feeling well enough to have a test that has been scheduled.  Daughter said that her father's nerves are getting worse from worry.  She thinks that he may need to see a psychologist.

## 2010-11-22 ENCOUNTER — Telehealth: Payer: Self-pay | Admitting: Cardiology

## 2010-11-22 NOTE — Telephone Encounter (Signed)
Pt's daughter spoke with you on Thursday of last week and wanted to talk to you again

## 2010-11-22 NOTE — Telephone Encounter (Signed)
Scheduled appointment with Dr Evelene Croon for august 30.  Will advise patient

## 2010-11-23 ENCOUNTER — Telehealth: Payer: Self-pay | Admitting: Cardiology

## 2010-11-23 NOTE — Telephone Encounter (Signed)
Daughter phoned wanting to get a letter stating that her father can not physically continue to take care of mother at home secondary to his medical condition

## 2010-11-23 NOTE — Telephone Encounter (Signed)
Called returning your phone call. Please call back. °

## 2010-11-23 NOTE — Telephone Encounter (Signed)
Spoke with patient and gave him appointment date and time with Dr Evelene Croon on 8/20

## 2010-11-24 ENCOUNTER — Telehealth: Payer: Self-pay | Admitting: Cardiology

## 2010-11-24 NOTE — Telephone Encounter (Signed)
Respective nurse answered the phone call.

## 2010-12-01 ENCOUNTER — Encounter: Payer: Self-pay | Admitting: *Deleted

## 2010-12-01 NOTE — Telephone Encounter (Signed)
I spoke with the patient today. He states his cough has been resolved for about 2 weeks now. He is ready to go ahead and schedule his a-flutter ablation. We will set him up for 8/22 with Dr. Graciela Husbands with labs and an EKG on 8/20.

## 2010-12-02 ENCOUNTER — Telehealth: Payer: Self-pay | Admitting: Cardiology

## 2010-12-02 NOTE — Telephone Encounter (Signed)
Patient has question regarding procedure at hospital.  Ablation.  Please call

## 2010-12-02 NOTE — Telephone Encounter (Signed)
Called wanting to know if could walk on treadmill. Per Dr. Patty Sermons can walk 3 times a day for 10 min. Also requesting copy of note sent to Dr. 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 Will fax copy to daughter Rushie Goltz at [phone]

## 2010-12-15 ENCOUNTER — Telehealth: Payer: Self-pay | Admitting: Cardiology

## 2010-12-15 MED ORDER — AZITHROMYCIN 250 MG PO TABS: ORAL_TABLET | ORAL | Status: AC

## 2010-12-15 NOTE — Telephone Encounter (Signed)
Pt would like a call back about a virus he states he has again

## 2010-12-15 NOTE — Telephone Encounter (Signed)
Advised patient

## 2010-12-15 NOTE — Telephone Encounter (Signed)
Sore throat, head stopped up, denies fever, aches.  Been using Mucinex and not helping.  Brother passed away 10/04/2022.  Ablation scheduled for next Wednesday.  Please advise

## 2010-12-15 NOTE — Telephone Encounter (Signed)
Continue Mucinex and add Zithromax Z-Pak as directed to try to clear up congestion before next week

## 2010-12-16 ENCOUNTER — Telehealth: Payer: Self-pay | Admitting: Cardiology

## 2010-12-16 ENCOUNTER — Telehealth: Payer: Self-pay | Admitting: Internal Medicine

## 2010-12-16 NOTE — Telephone Encounter (Signed)
Patient called back, he found information

## 2010-12-16 NOTE — Telephone Encounter (Signed)
Chart in box 

## 2010-12-16 NOTE — Telephone Encounter (Signed)
LMTC

## 2010-12-16 NOTE — Telephone Encounter (Signed)
Pt needs the name and phone number to the Psychiatry place he was referred to, if he doesn't pick up please leave message

## 2010-12-16 NOTE — Telephone Encounter (Signed)
Per pt call pt said he was told by Dr. Odessa Fleming nurse that pt can come and get his EKG and blood work done after he sees his other DR (psychiatrist)  at 9:30a. Pt said he can be here around 11am for the EKG and blood work. Please return pt call to advise/discuss.

## 2010-12-17 NOTE — Telephone Encounter (Signed)
PT CALLED TO LET us KNOW HAS ANOTHER MD APPT  ON 12-20-10   MAY RUN  A LITTLE LATE ON GETTING HERE FOR LABS AND EKG PT AWARE OKAY TO GET  HERE WHENEVER HE CAN MAKE IT .Zack Seal

## 2010-12-17 NOTE — Telephone Encounter (Signed)
Pt returned Heathers call from yesterday.  He will be home rest of the day.  Please call him today.

## 2010-12-20 ENCOUNTER — Other Ambulatory Visit (INDEPENDENT_AMBULATORY_CARE_PROVIDER_SITE_OTHER): Payer: Medicare Other | Admitting: *Deleted

## 2010-12-20 ENCOUNTER — Ambulatory Visit (INDEPENDENT_AMBULATORY_CARE_PROVIDER_SITE_OTHER): Payer: Medicare Other

## 2010-12-20 ENCOUNTER — Telehealth: Payer: Self-pay | Admitting: *Deleted

## 2010-12-20 ENCOUNTER — Other Ambulatory Visit: Payer: Self-pay | Admitting: Cardiology

## 2010-12-20 DIAGNOSIS — I2510 Atherosclerotic heart disease of native coronary artery without angina pectoris: Secondary | ICD-10-CM

## 2010-12-20 DIAGNOSIS — Z79899 Other long term (current) drug therapy: Secondary | ICD-10-CM

## 2010-12-20 DIAGNOSIS — I4892 Unspecified atrial flutter: Secondary | ICD-10-CM

## 2010-12-20 DIAGNOSIS — Z01810 Encounter for preprocedural cardiovascular examination: Secondary | ICD-10-CM

## 2010-12-20 LAB — BASIC METABOLIC PANEL
BUN: 11 mg/dL (ref 6–23)
CO2: 25 mEq/L (ref 19–32)
Calcium: 8.6 mg/dL (ref 8.4–10.5)
GFR: 72.55 mL/min (ref >60.00)
Glucose, Bld: 127 mg/dL — ABNORMAL HIGH (ref 70–99)

## 2010-12-20 LAB — HEPATIC FUNCTION PANEL
ALT: 22 U/L (ref 0–53)
Alkaline Phosphatase: 56 U/L (ref 39–117)
Bilirubin, Direct: 0.1 mg/dL (ref 0.0–0.3)
Total Protein: 7.1 g/dL (ref 6.0–8.3)

## 2010-12-20 LAB — LIPID PANEL
Cholesterol: 145 mg/dL (ref 0–200)
HDL: 41.9 mg/dL (ref >39.00)

## 2010-12-20 LAB — CBC WITH DIFFERENTIAL/PLATELET
Lymphocytes Relative: 24.2 % (ref 12.0–46.0)
MCHC: 33 g/dL (ref 30.0–36.0)
Monocytes Absolute: 0.3 10*3/uL (ref 0.1–1.0)
Neutrophils Relative %: 68.3 % (ref 43.0–77.0)
Platelets: 191 10*3/uL (ref 150.0–400.0)
RDW: 13 % (ref 11.5–14.6)

## 2010-12-20 LAB — LDL CHOLESTEROL, DIRECT: Direct LDL: 77.3 mg/dL

## 2010-12-20 NOTE — Telephone Encounter (Signed)
PT CAME IN TODAY FOR PRE PROCEDURE LABS AND EKG IS SCHEDULED FOR  ABLATION ON  WED  WITH DR Graciela Husbands  PT C/O FEELING ACHY  AND SCRATCHY THROAT JUST FINISHED Z-PAK YESTERDAY  AND CONT TO  TAKE MUSINEX AS DIRECTED  NO FEVER PER PT INFORMED PT TO SEE HOW HE FEELS TOM AND TO CALL IF NO IMPROVEMENT   WILL DISCUSS WITH DR Graciela Husbands . EKG ON DR Koren Bound CART  FOR REVIEW /CY

## 2010-12-21 ENCOUNTER — Telehealth: Payer: Self-pay | Admitting: *Deleted

## 2010-12-21 NOTE — Telephone Encounter (Signed)
Noted. Will review with Dr. Graciela Husbands.

## 2010-12-21 NOTE — Telephone Encounter (Signed)
I spoke with the patient. He is aware we are going to postpone his procedure for tomorrow. He will call me back the middle of next week to let me know how he feels. If he is ok, we will aim for 01/05/11. He voices understanding. EP lab made aware.

## 2010-12-21 NOTE — Telephone Encounter (Signed)
We should probobaly postpone as he will be asleep and there may be some extra risks to going to sleep with a resolving bronchitis like illness that with an elective procedure we dont need to undertake ths steve

## 2010-12-22 ENCOUNTER — Ambulatory Visit (HOSPITAL_COMMUNITY): Admission: RE | Admit: 2010-12-22 | Payer: Medicare Other | Source: Ambulatory Visit | Admitting: Internal Medicine

## 2010-12-22 ENCOUNTER — Telehealth: Payer: Self-pay | Admitting: *Deleted

## 2010-12-22 NOTE — Telephone Encounter (Signed)
Advised of labs 

## 2010-12-22 NOTE — Telephone Encounter (Signed)
Message copied by Eugenia Pancoast on Wed Dec 22, 2010 10:19 AM ------      Message from: Cassell Clement      Created: Tue Dec 21, 2010 12:57 PM       Please report. The CBC is normal.  He is not anemic.The liver tests are normal.  The total cholesterol is 145 which is excellent but the triglycerides are too high.  Watch carbohydrates and sweets.Continue same meds

## 2010-12-22 NOTE — Telephone Encounter (Signed)
Message copied by Eugenia Pancoast on Wed Dec 22, 2010 10:20 AM ------      Message from: Cassell Clement      Created: Tue Dec 21, 2010 12:57 PM       Please report. The CBC is normal.  He is not anemic.The liver tests are normal.  The total cholesterol is 145 which is excellent but the triglycerides are too high.  Watch carbohydrates and sweets.Continue same meds

## 2010-12-24 ENCOUNTER — Telehealth: Payer: Self-pay | Admitting: Cardiology

## 2010-12-24 NOTE — Telephone Encounter (Signed)
Pt needs medical advice about his blood pressure/pulse.  Pt states it was 108/55 and has been fluctuating. Pt states pulse was 55.  Please call pt back.  Chart in box

## 2010-12-24 NOTE — Telephone Encounter (Signed)
Advised to just continued to monitor and call back if heart rate low with radial pulse.  Has ENT follow up appointment on Thursday.

## 2010-12-24 NOTE — Telephone Encounter (Signed)
blood pressure 118/62 heart rate runs 61-65 when walks for 20 minutes.  Is in A flutter.  When he feels radial pulse heart rate around 60. He is also concerned about the hoarseness that has come back.  Still taking mucinex.  Dr Graciela Husbands will not do procedure (ablation) until completely better.  Please advise

## 2010-12-24 NOTE — Telephone Encounter (Signed)
Please report.  The blood pressure and pulse are satisfactory for him.  Continue same medicine for his heart and blood pressure.  If he keeps having problems with recurrent hoarseness he will need to see an ENT physician

## 2010-12-29 NOTE — Telephone Encounter (Signed)
Close encounter .[name]

## 2010-12-31 ENCOUNTER — Telehealth: Payer: Self-pay | Admitting: Cardiology

## 2010-12-31 NOTE — Telephone Encounter (Signed)
Concerned about blood pressure going up to 145-150 when he gets nervous or walks to mailbox. Advised to continue same dose of medications.  Call back if stays up and doesn't come back down, which it is now.

## 2010-12-31 NOTE — Telephone Encounter (Signed)
Pt was concerned about BP going up and down please call

## 2011-01-01 HISTORY — PX: INSERT / REPLACE / REMOVE PACEMAKER: SUR710

## 2011-01-04 ENCOUNTER — Telehealth: Payer: Self-pay | Admitting: Internal Medicine

## 2011-01-04 NOTE — Telephone Encounter (Signed)
Walk In Pt Form " Pt has Cough need Ablation done " sent to [facility]  01/04/11/km

## 2011-01-05 ENCOUNTER — Other Ambulatory Visit: Payer: Self-pay | Admitting: *Deleted

## 2011-01-05 ENCOUNTER — Other Ambulatory Visit: Payer: Self-pay | Admitting: Cardiology

## 2011-01-05 DIAGNOSIS — I10 Essential (primary) hypertension: Secondary | ICD-10-CM

## 2011-01-05 DIAGNOSIS — I4892 Unspecified atrial flutter: Secondary | ICD-10-CM

## 2011-01-05 DIAGNOSIS — R05 Cough: Secondary | ICD-10-CM

## 2011-01-05 DIAGNOSIS — I255 Ischemic cardiomyopathy: Secondary | ICD-10-CM

## 2011-01-05 DIAGNOSIS — E785 Hyperlipidemia, unspecified: Secondary | ICD-10-CM

## 2011-01-05 DIAGNOSIS — F419 Anxiety disorder, unspecified: Secondary | ICD-10-CM

## 2011-01-05 NOTE — Telephone Encounter (Signed)
Refilled meds per fax request.  

## 2011-01-06 ENCOUNTER — Telehealth: Payer: Self-pay | Admitting: *Deleted

## 2011-01-06 DIAGNOSIS — I4892 Unspecified atrial flutter: Secondary | ICD-10-CM

## 2011-01-06 DIAGNOSIS — Z01810 Encounter for preprocedural cardiovascular examination: Secondary | ICD-10-CM

## 2011-01-06 NOTE — Telephone Encounter (Signed)
I spoke with the patient to schedule his a-flutter ablation. This has been set for 9/26 @ 8:30am. He will come for labs and an EKG at 10am on 9/24.

## 2011-01-08 MED ORDER — ALPRAZOLAM 1 MG PO TABS: ORAL_TABLET | ORAL | Status: DC

## 2011-01-10 ENCOUNTER — Telehealth: Payer: Self-pay | Admitting: Cardiology

## 2011-01-10 NOTE — Telephone Encounter (Signed)
Would like you to give him a call back. He claims that while sitting his blood pressure is fine, but while standing for short or prolonged periods of time his blood pressure varies from high to extremely low.Please call back. I have pulled his chart.

## 2011-01-10 NOTE — Telephone Encounter (Signed)
Went to a birthday party and he wasn't feeling well so he went home and blood pressure 158/85 heart rate 65.  Sometimes when he's just talking to people or up doing anything little his blood pressure will go up to 150's, but heart rate will 50's-60's.  Does do treadmill everyday and blood pressure doesn't even get up to 160 and within 10 minutes comes down.  Once he sits down and relaxes, it goes down.  Heart rate does skip a lot.  Please advise

## 2011-01-10 NOTE — Telephone Encounter (Signed)
Try increasing metoprolol to 50 mg 3 times a day to help control high blood pressure and palpitations

## 2011-01-12 ENCOUNTER — Encounter: Payer: Self-pay | Admitting: *Deleted

## 2011-01-12 NOTE — Telephone Encounter (Signed)
After discussing with  Dr. Patty Sermons and patient having lower heart rates at times, will add an extra 1/2 tablet each day instead of full day. Advised patient

## 2011-01-21 ENCOUNTER — Telehealth: Payer: Self-pay | Admitting: Cardiology

## 2011-01-21 NOTE — Telephone Encounter (Signed)
Pt wants you to call him back asap

## 2011-01-21 NOTE — Telephone Encounter (Signed)
Spoke with patient and he was still having chest tightness advised him to go ahead and take another NTG.  Was feeling a little better and he was going to call his daughter to see if she could bring him to an appointment.  When I called him back, he stated he was hurting pretty bad and I advised him to call 911.  Called him back again to make sure he had called and EMS there.

## 2011-01-21 NOTE — Telephone Encounter (Signed)
Thanks for update

## 2011-01-21 NOTE — Telephone Encounter (Signed)
Patient called back and EMS said they thought he had an anxiety attack and didn't take him to emergency room.  They did tell patient to call them back if chest pains come back. Advised patient to do so.  Will just rest over weekend and see how things go.  Stated no chest pains at present time

## 2011-01-21 NOTE — Telephone Encounter (Signed)
Agree with advice given

## 2011-01-21 NOTE — Telephone Encounter (Signed)
Pt calling c/o chest pain. Pt said he has been having chest pain for the past 3 minutes. Pt BP was 170/85 with pulse rate 81. Pt took two nitro and 4 aspirin. Pt BP is now 123/64 and pulse rate 78. Pt wants to know if he needs to take a third nitro. Please return pt call to discuss further.

## 2011-01-22 ENCOUNTER — Emergency Department (HOSPITAL_COMMUNITY): Payer: Medicare Other

## 2011-01-22 ENCOUNTER — Inpatient Hospital Stay (HOSPITAL_COMMUNITY)
Admission: EM | Admit: 2011-01-22 | Discharge: 2011-01-24 | DRG: 313 | Disposition: A | Discharge status: Home / Self Care | Payer: Medicare Other | Attending: Cardiology | Admitting: Cardiology

## 2011-01-22 DIAGNOSIS — Z888 Allergy status to other drugs, medicaments and biological substances status: Secondary | ICD-10-CM

## 2011-01-22 DIAGNOSIS — R0789 Other chest pain: Principal | ICD-10-CM | POA: Diagnosis present

## 2011-01-22 DIAGNOSIS — IMO0001 Reserved for inherently not codable concepts without codable children: Secondary | ICD-10-CM | POA: Diagnosis present

## 2011-01-22 DIAGNOSIS — Z79899 Other long term (current) drug therapy: Secondary | ICD-10-CM

## 2011-01-22 DIAGNOSIS — Z882 Allergy status to sulfonamides status: Secondary | ICD-10-CM

## 2011-01-22 DIAGNOSIS — I4892 Unspecified atrial flutter: Secondary | ICD-10-CM | POA: Diagnosis present

## 2011-01-22 DIAGNOSIS — I2510 Atherosclerotic heart disease of native coronary artery without angina pectoris: Secondary | ICD-10-CM | POA: Diagnosis present

## 2011-01-22 DIAGNOSIS — I10 Essential (primary) hypertension: Secondary | ICD-10-CM | POA: Diagnosis present

## 2011-01-22 DIAGNOSIS — F411 Generalized anxiety disorder: Secondary | ICD-10-CM | POA: Diagnosis present

## 2011-01-22 DIAGNOSIS — E785 Hyperlipidemia, unspecified: Secondary | ICD-10-CM | POA: Diagnosis present

## 2011-01-22 DIAGNOSIS — Z7982 Long term (current) use of aspirin: Secondary | ICD-10-CM

## 2011-01-22 DIAGNOSIS — K219 Gastro-esophageal reflux disease without esophagitis: Secondary | ICD-10-CM | POA: Diagnosis present

## 2011-01-22 DIAGNOSIS — R079 Chest pain, unspecified: Secondary | ICD-10-CM

## 2011-01-22 DIAGNOSIS — Z951 Presence of aortocoronary bypass graft: Secondary | ICD-10-CM

## 2011-01-22 LAB — DIFFERENTIAL
Basophils Absolute: 0 10*3/uL (ref 0.0–0.1)
Lymphocytes Relative: 32 % (ref 12–46)
Monocytes Absolute: 0.4 10*3/uL (ref 0.1–1.0)
Neutro Abs: 3.6 10*3/uL (ref 1.7–7.7)

## 2011-01-22 LAB — CBC
HCT: 41.4 % (ref 39.0–52.0)
Hemoglobin: 14.3 g/dL (ref 13.0–17.0)
WBC: 6.2 10*3/uL (ref 4.0–10.5)

## 2011-01-22 LAB — BASIC METABOLIC PANEL
Calcium: 8.8 mg/dL (ref 8.4–10.5)
Chloride: 98 mEq/L (ref 96–112)
Creatinine, Ser: 0.92 mg/dL (ref 0.50–1.35)
GFR calc Af Amer: >60 mL/min (ref >60)

## 2011-01-22 LAB — POCT I-STAT TROPONIN I: Troponin i, poc: 0.01 ng/mL (ref 0.00–0.08)

## 2011-01-22 LAB — PROTIME-INR: INR: 1.01 (ref 0.00–1.49)

## 2011-01-23 LAB — CBC
HCT: 43.5 % (ref 39.0–52.0)
Hemoglobin: 14.8 g/dL (ref 13.0–17.0)
MCH: 31 pg (ref 26.0–34.0)
MCHC: 34 g/dL (ref 30.0–36.0)
MCV: 91.2 fL (ref 78.0–100.0)

## 2011-01-23 LAB — CARDIAC PANEL(CRET KIN+CKTOT+MB+TROPI)
CK, MB: 2 ng/mL (ref 0.3–4.0)
Relative Index: INVALID (ref 0.0–2.5)
Total CK: 57 U/L (ref 7–232)
Troponin I: <0.3 ng/mL (ref <0.30)

## 2011-01-23 LAB — HEPARIN LEVEL (UNFRACTIONATED): Heparin Unfractionated: 0.36 IU/mL (ref 0.30–0.70)

## 2011-01-23 LAB — BASIC METABOLIC PANEL
CO2: 31 mEq/L (ref 19–32)
Calcium: 9.2 mg/dL (ref 8.4–10.5)
Creatinine, Ser: 0.96 mg/dL (ref 0.50–1.35)
Glucose, Bld: 91 mg/dL (ref 70–99)

## 2011-01-23 LAB — CK TOTAL AND CKMB (NOT AT ARMC): Total CK: 49 U/L (ref 7–232)

## 2011-01-24 ENCOUNTER — Telehealth: Payer: Self-pay | Admitting: Internal Medicine

## 2011-01-24 ENCOUNTER — Ambulatory Visit: Payer: Medicare Other | Admitting: Cardiology

## 2011-01-24 ENCOUNTER — Other Ambulatory Visit: Payer: Medicare Other | Admitting: *Deleted

## 2011-01-24 DIAGNOSIS — R079 Chest pain, unspecified: Secondary | ICD-10-CM

## 2011-01-24 LAB — RAPID URINE DRUG SCREEN, HOSP PERFORMED
Amphetamines: NOT DETECTED
Barbiturates: NOT DETECTED
Benzodiazepines: POSITIVE — AB
Cocaine: NOT DETECTED
Opiates: NOT DETECTED
Tetrahydrocannabinol: NOT DETECTED

## 2011-01-24 LAB — DIFFERENTIAL
Basophils Absolute: 0
Basophils Relative: 1
Eosinophils Absolute: 0.2
Lymphs Abs: 1.8
Neutrophils Relative %: 58

## 2011-01-24 LAB — CBC
HCT: 42.7 % (ref 39.0–52.0)
HCT: 40.4
Hemoglobin: 13.8
MCH: 30.9 pg (ref 26.0–34.0)
MCHC: 33.5 g/dL (ref 30.0–36.0)
MCHC: 34.1
MCV: 92.6
Platelets: 198
RBC: 4.36
RDW: 12.6 % (ref 11.5–15.5)
RDW: 12.8
WBC: 5.7

## 2011-01-24 LAB — POCT I-STAT, CHEM 8
BUN: 10
HCT: 44
Hemoglobin: 15
Sodium: 133 — ABNORMAL LOW
TCO2: 31

## 2011-01-24 LAB — ETHANOL: Alcohol, Ethyl (B): <5

## 2011-01-24 LAB — HEPARIN LEVEL (UNFRACTIONATED): Heparin Unfractionated: 0.56 [IU]/mL (ref 0.30–0.70)

## 2011-01-26 ENCOUNTER — Observation Stay (HOSPITAL_COMMUNITY)
Admission: AD | Admit: 2011-01-26 | Discharge: 2011-01-28 | DRG: 244 | Disposition: A | Discharge status: Home / Self Care | Payer: Medicare Other | Source: Ambulatory Visit | Attending: Internal Medicine | Admitting: Internal Medicine

## 2011-01-26 DIAGNOSIS — IMO0001 Reserved for inherently not codable concepts without codable children: Secondary | ICD-10-CM | POA: Insufficient documentation

## 2011-01-26 DIAGNOSIS — I4892 Unspecified atrial flutter: Secondary | ICD-10-CM

## 2011-01-26 DIAGNOSIS — Z8249 Family history of ischemic heart disease and other diseases of the circulatory system: Secondary | ICD-10-CM | POA: Insufficient documentation

## 2011-01-26 DIAGNOSIS — F411 Generalized anxiety disorder: Secondary | ICD-10-CM | POA: Insufficient documentation

## 2011-01-26 DIAGNOSIS — K219 Gastro-esophageal reflux disease without esophagitis: Secondary | ICD-10-CM | POA: Insufficient documentation

## 2011-01-26 DIAGNOSIS — I459 Conduction disorder, unspecified: Principal | ICD-10-CM | POA: Insufficient documentation

## 2011-01-26 DIAGNOSIS — E785 Hyperlipidemia, unspecified: Secondary | ICD-10-CM | POA: Insufficient documentation

## 2011-01-26 DIAGNOSIS — Z951 Presence of aortocoronary bypass graft: Secondary | ICD-10-CM | POA: Insufficient documentation

## 2011-01-26 DIAGNOSIS — Z01810 Encounter for preprocedural cardiovascular examination: Secondary | ICD-10-CM | POA: Insufficient documentation

## 2011-01-26 DIAGNOSIS — I10 Essential (primary) hypertension: Secondary | ICD-10-CM | POA: Insufficient documentation

## 2011-01-26 DIAGNOSIS — I2582 Chronic total occlusion of coronary artery: Secondary | ICD-10-CM | POA: Insufficient documentation

## 2011-01-26 DIAGNOSIS — I2510 Atherosclerotic heart disease of native coronary artery without angina pectoris: Secondary | ICD-10-CM | POA: Insufficient documentation

## 2011-01-26 LAB — SURGICAL PCR SCREEN: Staphylococcus aureus: NEGATIVE

## 2011-01-26 LAB — MRSA PCR SCREENING: MRSA by PCR: NEGATIVE

## 2011-01-27 DIAGNOSIS — I442 Atrioventricular block, complete: Secondary | ICD-10-CM

## 2011-01-27 LAB — SURGICAL PCR SCREEN
MRSA, PCR: NEGATIVE
Staphylococcus aureus: NEGATIVE

## 2011-01-27 NOTE — Discharge Summary (Addendum)
NAME:  [name], [name] NO.:  [id number]  MEDICAL RECORD NO.:  [id number]  LOCATION:  [id number]                         FACILITY:  [facility]  PHYSICIAN:  Cassell Clement, M.D. DATE OF BIRTH:  Jul 26, 1940  DATE OF ADMISSION:  01/22/2011 DATE OF DISCHARGE:  01/24/2011                              DISCHARGE SUMMARY   DISCHARGE DIAGNOSES: 1. Chest pain.     a.     Rule out for an myocardial infarction with negative cardiac      enzymes.     b.     Consider outpatient Myoview at followup appointment. 2. Atrial flutter, scheduled for ablation on January 26, 2011.     a.     Not on Coumadin, in sinus rhythm at discharge. 3. Fibromyalgia. 4. Gastroesophageal reflux disease. 5. Anxiety. 6. Hypertension. 7. Hyperlipidemia. 8. Coronary artery disease.     a.     Status post coronary artery bypass graft in 1991 with left      internal mammary artery to left anterior descending, saphenous      vein graft to obtuse marginal and circumflex, saphenous vein graft      to the posterior descending artery and posterolateral artery.     b.     Status post redo coronary artery bypass graft x3 with      saphenous vein graft to the obtuse marginal, sequential saphenous      vein graft to the posterior descending artery and posterior      lateral artery in February 2007.     c.     Last cath July 2011 showing left anterior descending,      circumflex, right coronary artery totalled with patent grafts. 9. 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  HOSPITAL COURSE:  [name] is a 70 year old gentleman with a history of CAD and recently escalating anxiety.  He is also had atrial flutter in June of this year and was scheduled for a flutter ablation, however, this was deferred for various reasons such as sore throat.  He is currently scheduled for Wednesday of this week.  He had labile blood pressures at home associated with some swimmy  headed sensation and chest discomfort, so called EMS.  Pain was sharp and heavy.  There is no radiation or associated nausea, vomiting or diaphoresis.  He may have been a little short of breath.  In the ER, he received nitroglycerin and was pain free.  First troponin was negative.  EKG demonstrated no acute changes.  His chest pain was felt atypical in nature and home medications were continued.  His blood pressure was monitored.  He eventually ruled out for an MI and his EKG remains negative.  Dr. Patty Sermons felt that he could be seen back in the office next week with an outpatient Myoview to be set up at that time.  In regards to his atrial flutter, he remained in sinus rhythm.  Dr. Antoine Poche did note that the patient is not on Coumadin.  There was no further discussion of this admission.  Per Gypsy Balsam, RN, given that the patient is in sinus rhythm.  He may remain off Coumadin, but this should be considered if his atrial arrhythmias recur.  We will defer to the outpatient setting. Dr. Patty Sermons has seen and examined him today and feels he is stable for discharge.  DISCHARGE LABS:  WBC 7.1, hemoglobin 14.0, hematocrit 42.7, platelet count 181.  Sodium 139, potassium 4.7, chloride 103, CO2 31, glucose 91, BUN 10, creatinine 0.86.  Cardiac enzymes negative x4.  STUDIES:  Chest x-ray on January 23, 2011, showed no active cardiopulmonary abnormalities.  DISCHARGE MEDICATIONS: 1. Zocor 20 mg p.o. nightly, this is a new decrease dose given his     concurrent use with amlodipine. 2. Amlodipine 5 mg daily. 3. Aspirin 81 mg daily. 4. Buspirone 10 mg b.i.d. 5. Calcium carbonate, vitamin D 600 mg OTC 1 tablet daily. 6. Doxepin 25 mg t.i.d. 7. Fexofenadine 60 mg daily as needed for allergies. 8. Imdur 60 mg daily. 9. Metoprolol tartrate 50 mg b.i.d. 10.Mucinex 600 mg q.4 h. p.r.n. congestion. 11.Multivitamin 1 tablet daily. 12.Nitro sublingual 0.4 mg every 5 minutes as needed up to  three doses     for chest pain. 13.Omeprazole 20 mg b.i.d. 14.Senna/docusate 8.6/50 mg daily as needed for constipation. 15.Tylenol Extra Strength 500 mg 1-2 tablets q.6 h. p.r.n. pain. 16.Xanax 1 mg t.i.d.  DISPOSITION:  [name] will be discharged in stable condition to home.  He is instructed to increase activity slowly as tolerated and follow a low-sodium heart-healthy diet.  He will return to Dr. Yevonne Pax office on February 07, 2011, to see Norma Fredrickson at 10:00 a.m.  Would consider outpatient Myoview at that time.  He will return on January 26, 2011, at 8:30 a.m. for his atrial flutter ablation and is to arrive at short stay at 6:30 a.m.[name] Kitchen  Please note that, it was instructed to take Coumadin to ensure this was intentionally left off his medication list with Dr. Patty Sermons prior to discharge.  He is not to eat or drink anything after midnight the night before his ablation.  DURATION OF DISCHARGE ENCOUNTER:  Greater than 30 minutes including physician and PA time.     Ronie Spies, P.A.C.   ______________________________ Cassell Clement, M.D.    DD/MEDQ  D:  01/24/2011  T:  01/24/2011  Job:  [id number]  cc:   Duke Salvia, MD, [facility]  Electronically Signed by Cassell Clement M.D. on 01/27/2011 08:06:00 AM Electronically Signed by Ronie Spies  on 01/27/2011 07:02:01 PM MedRecNo: [id number] [facility], Account: [id number], DocSeq: [id number] Within disposition, the line "it was instructed to take Coumadin" was erroneously transcribed. The patient is not currently on Coumadin. This was confirmed with Dr. Patty Sermons who feels that no Coumadin is necessary given an isolated episode of atrial  flutter. Electronically Signed by Ronie Spies  on 01/27/2011 07:01:54 PM

## 2011-01-28 ENCOUNTER — Inpatient Hospital Stay (HOSPITAL_COMMUNITY): Payer: Medicare Other

## 2011-01-31 ENCOUNTER — Telehealth: Payer: Self-pay | Admitting: Cardiology

## 2011-01-31 HISTORY — PX: ATRIAL FLUTTER ABLATION: SHX5733

## 2011-01-31 NOTE — Telephone Encounter (Signed)
Pt calling wanting to speak w/ Juliette Alcide regarding pt cholesterol meds. Please return pt call to discuss further.

## 2011-01-31 NOTE — Telephone Encounter (Signed)
Simvastatin decreased to 20 mg daily secondary to Amlodipine 5 mg.  Advised to continue this dose until seen by  Dr. Patty Sermons.

## 2011-01-31 NOTE — Telephone Encounter (Signed)
Yes the simvastatin should be 20 mg

## 2011-02-02 ENCOUNTER — Telehealth: Payer: Self-pay | Admitting: Internal Medicine

## 2011-02-02 NOTE — Telephone Encounter (Signed)
Pt calling about swelling around device. Please return call.

## 2011-02-03 ENCOUNTER — Ambulatory Visit: Payer: Medicare Other | Admitting: *Deleted

## 2011-02-03 NOTE — Telephone Encounter (Signed)
Patient concerned about some swelling @ the site.  No drainage noted.  We will see him 10/5 @ 2pm.

## 2011-02-03 NOTE — Telephone Encounter (Signed)
Pt called again about swelling around site.  No drainage, but really tight around site.

## 2011-02-04 ENCOUNTER — Encounter: Payer: Self-pay | Admitting: Internal Medicine

## 2011-02-04 ENCOUNTER — Ambulatory Visit (INDEPENDENT_AMBULATORY_CARE_PROVIDER_SITE_OTHER): Payer: Medicare Other | Admitting: *Deleted

## 2011-02-04 DIAGNOSIS — I442 Atrioventricular block, complete: Secondary | ICD-10-CM

## 2011-02-04 LAB — PACEMAKER DEVICE OBSERVATION
AL THRESHOLD: 0.75 V
BAMS-0001: 180 {beats}/min
BAMS-0003: 70 {beats}/min
DEVICE MODEL PM: 7282780
RV LEAD AMPLITUDE: 12 mv

## 2011-02-07 ENCOUNTER — Telehealth: Payer: Self-pay | Admitting: Cardiology

## 2011-02-07 ENCOUNTER — Encounter: Payer: Medicare Other | Admitting: Nurse Practitioner

## 2011-02-07 NOTE — Telephone Encounter (Signed)
Pt called. Pt wants to know about panic attacks or heart problems please call

## 2011-02-07 NOTE — Telephone Encounter (Signed)
I spoke at length with pt regarding recent events of chest pain. Pt was in the office for pacer check last Friday. He states he had substernal nonradiating chest pain that night. He took maalox,  4 baby aspirin and 3sl ntg as directed. He called 911 "they said everything looked okay" and pt did not go to the hospital. Pt has again had chest pain after walking 40 feet to and from the mailbox. Pt denies edema but does get DOE.  He again took Maalox, 4 baby aspirin, and 3 sl ntg.  Pt says he does not know if it is anxiety or blockages. He is very concerned. This chest pressure has now eased. He did not call EMS.  Pt states his daughter is very concerned that he might have blockages and thinks he needs a stress test.   bp after returning was 180/85 hr 82    bp  At rest is                123/68 hr 70  I have made appt for pt with Lawson Fiscal NP. Pt daughter, Rushie Goltz, also has appt tomorrow with  Dr. Patty Sermons.  She will bring him as they will both have 9:15am appts.  Mylo Red RN

## 2011-02-08 ENCOUNTER — Encounter: Payer: Self-pay | Admitting: Nurse Practitioner

## 2011-02-08 ENCOUNTER — Telehealth: Payer: Self-pay | Admitting: Cardiology

## 2011-02-08 ENCOUNTER — Ambulatory Visit (INDEPENDENT_AMBULATORY_CARE_PROVIDER_SITE_OTHER): Payer: Medicare Other | Admitting: Nurse Practitioner

## 2011-02-08 VITALS — BP 130/90 | HR 60 | Ht 71.0 in | Wt 196.8 lb

## 2011-02-08 DIAGNOSIS — E785 Hyperlipidemia, unspecified: Secondary | ICD-10-CM

## 2011-02-08 DIAGNOSIS — R079 Chest pain, unspecified: Secondary | ICD-10-CM

## 2011-02-08 DIAGNOSIS — Z01810 Encounter for preprocedural cardiovascular examination: Secondary | ICD-10-CM

## 2011-02-08 DIAGNOSIS — F419 Anxiety disorder, unspecified: Secondary | ICD-10-CM

## 2011-02-08 DIAGNOSIS — I255 Ischemic cardiomyopathy: Secondary | ICD-10-CM

## 2011-02-08 DIAGNOSIS — I119 Hypertensive heart disease without heart failure: Secondary | ICD-10-CM

## 2011-02-08 DIAGNOSIS — I2589 Other forms of chronic ischemic heart disease: Secondary | ICD-10-CM

## 2011-02-08 DIAGNOSIS — I10 Essential (primary) hypertension: Secondary | ICD-10-CM

## 2011-02-08 DIAGNOSIS — F411 Generalized anxiety disorder: Secondary | ICD-10-CM

## 2011-02-08 LAB — I-STAT 8, (EC8 V) (CONVERTED LAB)
Acid-Base Excess: 1
Chloride: 98
Glucose, Bld: 101 — ABNORMAL HIGH
pCO2, Ven: 47.9
pH, Ven: 7.368 — ABNORMAL HIGH

## 2011-02-08 LAB — BASIC METABOLIC PANEL
BUN: 13 mg/dL (ref 6–23)
CO2: 25 mEq/L (ref 19–32)
Calcium: 9 mg/dL (ref 8.4–10.5)
Chloride: 101 mEq/L (ref 96–112)
Creatinine, Ser: 1.1 mg/dL (ref 0.4–1.5)
GFR: 68.8 mL/min (ref >60.00)
Glucose, Bld: 96 mg/dL (ref 70–99)
Potassium: 4.5 mEq/L (ref 3.5–5.1)
Sodium: 138 mEq/L (ref 135–145)

## 2011-02-08 LAB — CARDIAC PANEL(CRET KIN+CKTOT+MB+TROPI)
CK, MB: 1.5
CK, MB: 1.5
Relative Index: INVALID
Total CK: 47
Total CK: 51
Total CK: 68
Troponin I: 0.01
Troponin I: 0.02

## 2011-02-08 LAB — POCT CARDIAC MARKERS
CKMB, poc: <1 — ABNORMAL LOW
CKMB, poc: <1 — ABNORMAL LOW
Myoglobin, poc: 65.3
Myoglobin, poc: 66.3
Operator id: 196461
Operator id: 196461
Troponin i, poc: <0.05

## 2011-02-08 LAB — CBC WITH DIFFERENTIAL/PLATELET
Basophils Absolute: 0 10*3/uL (ref 0.0–0.1)
Basophils Relative: 0.5 % (ref 0.0–3.0)
Eosinophils Absolute: 0.1 10*3/uL (ref 0.0–0.7)
Eosinophils Relative: 1.6 % (ref 0.0–5.0)
HCT: 44.5 % (ref 39.0–52.0)
Hemoglobin: 14.9 g/dL (ref 13.0–17.0)
Lymphocytes Relative: 25.6 % (ref 12.0–46.0)
Lymphs Abs: 1.8 10*3/uL (ref 0.7–4.0)
MCHC: 33.4 g/dL (ref 30.0–36.0)
MCV: 94.9 fl (ref 78.0–100.0)
Monocytes Absolute: 0.4 10*3/uL (ref 0.1–1.0)
Monocytes Relative: 5.7 % (ref 3.0–12.0)
Neutro Abs: 4.7 10*3/uL (ref 1.4–7.7)
Neutrophils Relative %: 66.6 % (ref 43.0–77.0)
Platelets: 224 10*3/uL (ref 150.0–400.0)
RBC: 4.68 Mil/uL (ref 4.22–5.81)
RDW: 12.6 % (ref 11.5–14.6)
WBC: 7 10*3/uL (ref 4.5–10.5)

## 2011-02-08 LAB — POCT I-STAT CREATININE: Operator id: 196461

## 2011-02-08 LAB — COMPREHENSIVE METABOLIC PANEL
ALT: 15
AST: 20
Albumin: 3.3 — ABNORMAL LOW
CO2: 27
Calcium: 8.1 — ABNORMAL LOW
Creatinine, Ser: 1
GFR calc Af Amer: >60
GFR calc non Af Amer: >60
Sodium: 131 — ABNORMAL LOW
Total Protein: 5.9 — ABNORMAL LOW

## 2011-02-08 LAB — DIFFERENTIAL
Basophils Absolute: 0
Basophils Relative: 1
Eosinophils Relative: 1
Lymphocytes Relative: 23
Neutro Abs: 4.9

## 2011-02-08 LAB — PROTIME-INR
INR: 1 ratio (ref 0.8–1.0)
Prothrombin Time: 10.6 s (ref 10.2–12.4)

## 2011-02-08 LAB — APTT
aPTT: 31
aPTT: 27 s (ref 21.7–28.8)

## 2011-02-08 LAB — B-NATRIURETIC PEPTIDE (CONVERTED LAB): Pro B Natriuretic peptide (BNP): 114 — ABNORMAL HIGH

## 2011-02-08 LAB — CBC
MCHC: 34
Platelets: 229
RDW: 12.8

## 2011-02-08 MED ORDER — AMLODIPINE BESYLATE 5 MG PO TABS: 5.0000 mg | ORAL_TABLET | Freq: Two times a day (BID) | ORAL | Status: DC

## 2011-02-08 NOTE — Assessment & Plan Note (Signed)
He is having recurrent symptoms. I don't think a myoview will sort out the etiology of his symptoms. I have talked with Dr. Patty Sermons. We will arrange for repeat cardiac cath with Dr. [place]. His last study was 15 months ago. Norvasc is also increased to 5 mg BID. Patient is agreeable to this plan and will call if any problems develop in the interim.

## 2011-02-08 NOTE — Patient Instructions (Addendum)
I want you to increase the Norvasc to 5 mg two times a day   You are scheduled for a cardiac catheterization on Friday October 19th with Dr. [place]and.  Go to [facility] 2nd Floor Short Stay on Friday October 19th at 7am. Your procedure is scheduled for 9am No food or drink after midnight on Thursday. You may take your medications with a sip of water on the day of your procedure.  Someone must be with you to drive you home.   Angiography Angiography is a procedure used to look at the blood vessels (arteries) which carry the blood to different parts of your body. In this procedure a dye is injected through a catheter (a long, hollow tube about the size of a piece of cooked spaghetti) into an artery and x-rays are taken. The x-rays will show if there is a blockage or problem in a blood vessel.  PREPARATION FOR THE PROCEDURE  Let your caregiver know if you have had an allergy to dyes used in x-ray if you have ever had kidney problems or failure.   Do not eat or drink starting from midnight up to the time of the procedure, or as directed.   You may drink enough water to take your medications the morning of the procedure if you were instructed to do so.   You should be at the hospital or outpatient facility where the procedure is to be done 2hrs prior to the procedure or as directed.  PROCEDURE: 1. You may be given a medication to help you relax before and during the procedure through an IV in your hand or arm.  2. A local anesthetic to make the area numb may be used before inserting the catheter.  3. You will be prepared for the procedure by washing and shaving the area where the catheter will be inserted. This is usually done in the groin but may be done in the fold of your arm by your elbow.  4. A specially trained doctor will insert the catheter with a guide wire into an artery. This is guided under a special type of x-ray (fluoroscopy) to the blood vessel being examined.  5. Special dye is  then injected and x-rays are taken. These will show where any narrowing or blockages are located.  AFTER THE PROCEDURE  After the procedure you will be kept in bed for several hours.   The access site will be watched and you will be checked frequently.   Blood tests, other x-rays and an EKG may be done.   You may stay in the hospital overnight for observation.  SEEK IMMEDIATE MEDICAL CARE IF:  You develop chest pain, shortness of breath, feel faint, or pass out.   There is bleeding, swelling, or drainage from the catheter insertion site.   You develop pain, discoloration, coldness, or severe bruising in the leg or arm, or area where the catheter was inserted.   An oral temperature above hrs develops.  Document Released: 01/26/2005 Document Re-Released: 08/18/2007 [facility] Patient Information 2011 [place], [place].

## 2011-02-08 NOTE — Telephone Encounter (Signed)
Pt wants to know if ok to drive 25 miles to the [address] tomorrow and back?

## 2011-02-08 NOTE — Telephone Encounter (Signed)
Is this ok?

## 2011-02-08 NOTE — Assessment & Plan Note (Signed)
He is quite anxious. Very difficult to sort out his symptoms. Cardiac cath is felt to be warranted to further define the etiology.

## 2011-02-08 NOTE — Telephone Encounter (Signed)
Yes will be okay to do that.

## 2011-02-08 NOTE — Assessment & Plan Note (Signed)
Blood pressure is up at home. Norvasc is increased.

## 2011-02-08 NOTE — Progress Notes (Signed)
Addended by: Alma Friendly on: 02/08/2011 10:24 AM   Modules accepted: Orders

## 2011-02-08 NOTE — Telephone Encounter (Signed)
Advised patient

## 2011-02-08 NOTE — Telephone Encounter (Signed)
States he is having problems breathing through nose and hard to catch his breath. Heart rate up to 94 when walked to mailbox.  Advised ok to try plain Mucinex.  If no better call

## 2011-02-08 NOTE — Telephone Encounter (Signed)
Agree with advice given

## 2011-02-08 NOTE — Progress Notes (Signed)
Addended by: Alma Friendly on: 02/08/2011 10:25 AM   Modules accepted: Orders

## 2011-02-08 NOTE — Progress Notes (Signed)
Addended by: Layci Stenglein, KEITH K on: 02/08/2011 10:25 AM   Modules accepted: Orders  

## 2011-02-08 NOTE — Assessment & Plan Note (Signed)
He is on the lower dose of Zocor. He says he may switch to Lipitor. This was not addressed today.

## 2011-02-08 NOTE — Progress Notes (Signed)
[name] Date of Birth: 1941/03/15   History of Present Illness: [name] is seen today for a work in visit. He is seen for [name]. He is here because of recurrent chest pain. His history is very hard to follow. He remains quite anxious. He has apparently called EMS 5 to 6 times over the last month or so because of chest pain. Yesterday he had pain with walking to the mail box. He has noted more issues with his blood pressure. He is using NTG. He says it is all his heart pain. He admits to being very anxious and cannot sort out where his symptoms are coming from. He has had a recent ablation that he says was not successful. It was complicated by complete heart block and he has had recent pacemaker implanted.   Current Outpatient Prescriptions on File Prior to Visit  Medication Sig Dispense Refill  . acetaminophen (TYLENOL) 325 MG tablet Take 650 mg by mouth every 6 (six) hours as needed.        . ALPRAZolam (XANAX) 1 MG tablet 1 mg 3 (three) times daily. Twice daily or as directed       . aspirin 81 MG EC tablet Take 81 mg by mouth daily.        . busPIRone (BUSPAR) 10 MG tablet Take 10 mg by mouth 2 (two) times daily.        . Calcium Carbonate-Vitamin D (CALCIUM + D PO) Take by mouth daily.        Tery Sanfilippo Sodium (COLACE PO) Take by mouth as needed.        . fexofenadine (ALLEGRA) 60 MG tablet Take 60 mg by mouth as needed.        . isosorbide mononitrate (IMDUR) 60 MG 24 hr tablet Take 60 mg by mouth daily.        . metoprolol (LOPRESSOR) 50 MG tablet Take 1 tablet (50 mg total) by mouth 2 (two) times daily.  180 tablet  3  . Multiple Vitamin (MULTIVITAMIN) tablet Take 1 tablet by mouth daily.        . nitroGLYCERIN (NITROSTAT) 0.4 MG SL tablet Place 0.4 mg under the tongue every 5 (five) minutes as needed.        [name] Kitchen omeprazole (PRILOSEC) 20 MG capsule Take 20 mg by mouth 2 (two) times daily.        . simvastatin (ZOCOR) 40 MG tablet Take 20 mg by mouth at bedtime.         . sucralfate (CARAFATE) 1 G tablet Take 1 g by mouth as needed.        [name] Kitchen DISCONTD: amLODipine (NORVASC) 5 MG tablet Take 1 tablet (5 mg total) by mouth daily.  30 tablet  11    Allergies  Allergen Reactions  . Sulfa Drugs Cross Reactors   . Ultram (Tramadol Hcl)     Past Medical History  Diagnosis Date  . Ischemic cardiomyopathy     EF 45%  . Hypertension   . Hyperlipidemia   . GERD (gastroesophageal reflux disease)   . Anxiety   . Fibromyalgia   . CAD (coronary artery disease)     prior CABG in 1991with redo surgery in 2007  . S/P cardiac cath July 2011    Grafts patent. Mild to moderate LV dysfunction. Managed medically  . Atrial flutter October 2012    S/P ablation - not successful  . CHB (complete heart block) October 2012  S/P PTVP following ablation    Past Surgical History  Procedure Date  . Cardiac catheterization 10/30/2009    Grafts patent. Mild to moderate LV dysfunction. EF 45%. Managed medically.   . Coronary artery bypass graft     Originial surgery in 1991 with LIMA to LAD, SVG to OM/LCX, SVG to PD & PL; Redo surgery in 2007 with SVG to OM and SVG to PD & PL  . Hernia repair   . Tonsillectomy     History  Smoking status  . Former Smoker  . Quit date: 09/21/1973  Smokeless tobacco  . Not on file    History  Alcohol Use No    Family History  Problem Relation Age of Onset  . Stroke Mother   . Heart attack Father     Review of Systems: The review of systems is positive for recurrent episodes of chest pain. Blood pressure is trending up. No real shortness of breath.  All other systems were reviewed and are negative.  Physical Exam: BP 130/90  Pulse 60  Ht 5\' 11"  (1.803 m)  Wt 196 lb 12.8 oz (89.268 kg)  BMI 27.45 kg/m2 Patient is very anxious but in no acute distress. Skin is warm and dry. Color is normal.  HEENT is unremarkable. Normocephalic/atraumatic. PERRL. Sclera are nonicteric. Neck is supple. No masses. No JVD. Lungs are clear.  Cardiac exam shows a regular rate and rhythm. Pacemaker is in the left upper chest. Site is ok. Still not completely healed. Abdomen is soft. Extremities are without edema. Gait and ROM are intact. No gross neurologic deficits noted.   LABORATORY DATA: EKG shows paced rhythm.   Assessment / Plan:

## 2011-02-09 ENCOUNTER — Telehealth: Payer: Self-pay | Admitting: Cardiology

## 2011-02-09 ENCOUNTER — Telehealth: Payer: Self-pay | Admitting: *Deleted

## 2011-02-09 ENCOUNTER — Inpatient Hospital Stay (HOSPITAL_COMMUNITY)
Admission: EM | Admit: 2011-02-09 | Discharge: 2011-02-10 | DRG: 287 | Disposition: A | Discharge status: Home / Self Care | Payer: Medicare Other | Attending: Cardiology | Admitting: Cardiology

## 2011-02-09 DIAGNOSIS — Z7982 Long term (current) use of aspirin: Secondary | ICD-10-CM

## 2011-02-09 DIAGNOSIS — R079 Chest pain, unspecified: Secondary | ICD-10-CM

## 2011-02-09 DIAGNOSIS — Z950 Presence of cardiac pacemaker: Secondary | ICD-10-CM

## 2011-02-09 DIAGNOSIS — I2510 Atherosclerotic heart disease of native coronary artery without angina pectoris: Principal | ICD-10-CM | POA: Diagnosis present

## 2011-02-09 DIAGNOSIS — I2582 Chronic total occlusion of coronary artery: Secondary | ICD-10-CM | POA: Diagnosis present

## 2011-02-09 DIAGNOSIS — F411 Generalized anxiety disorder: Secondary | ICD-10-CM | POA: Diagnosis present

## 2011-02-09 DIAGNOSIS — IMO0001 Reserved for inherently not codable concepts without codable children: Secondary | ICD-10-CM | POA: Diagnosis present

## 2011-02-09 DIAGNOSIS — K219 Gastro-esophageal reflux disease without esophagitis: Secondary | ICD-10-CM | POA: Diagnosis present

## 2011-02-09 DIAGNOSIS — Z951 Presence of aortocoronary bypass graft: Secondary | ICD-10-CM

## 2011-02-09 DIAGNOSIS — I4892 Unspecified atrial flutter: Secondary | ICD-10-CM | POA: Diagnosis present

## 2011-02-09 DIAGNOSIS — E785 Hyperlipidemia, unspecified: Secondary | ICD-10-CM | POA: Diagnosis present

## 2011-02-09 DIAGNOSIS — Z79899 Other long term (current) drug therapy: Secondary | ICD-10-CM

## 2011-02-09 DIAGNOSIS — I2589 Other forms of chronic ischemic heart disease: Secondary | ICD-10-CM | POA: Diagnosis present

## 2011-02-09 DIAGNOSIS — I10 Essential (primary) hypertension: Secondary | ICD-10-CM | POA: Diagnosis present

## 2011-02-09 NOTE — Telephone Encounter (Signed)
Pt calling re having SOB and wants to know if could be due to last procedure

## 2011-02-09 NOTE — Telephone Encounter (Signed)
Message copied by Burnell Blanks on Wed Feb 09, 2011  5:48 PM ------      Message from: Rosalio Macadamia      Created: Tue Feb 08, 2011  4:52 PM       Ok to report. Labs are satisfactory.

## 2011-02-09 NOTE — Telephone Encounter (Signed)
Called patient and he was now having chest pain and had taken 3 NTG, 4 ASA, and a Xanax.  Pain starting to ease up.  Will continue to avoid strenuous activity until after cath next week.  Continue with NTG and Xanax as needed and call 911 if chest pains last more than 30 minutes.

## 2011-02-09 NOTE — Progress Notes (Signed)
Advised of labs 

## 2011-02-09 NOTE — Telephone Encounter (Signed)
Agree with advice given

## 2011-02-09 NOTE — Telephone Encounter (Signed)
Advised of labs 

## 2011-02-10 ENCOUNTER — Emergency Department (HOSPITAL_COMMUNITY): Payer: Medicare Other

## 2011-02-10 ENCOUNTER — Ambulatory Visit: Payer: Medicare Other | Admitting: *Deleted

## 2011-02-10 DIAGNOSIS — I2510 Atherosclerotic heart disease of native coronary artery without angina pectoris: Secondary | ICD-10-CM

## 2011-02-10 LAB — LIPID PANEL
Cholesterol: 157 mg/dL (ref 0–200)
HDL: 40 mg/dL (ref >39)
LDL Cholesterol: 85 mg/dL (ref 0–99)
Total CHOL/HDL Ratio: 4.4 RATIO
Total CHOL/HDL Ratio: 3.7 RATIO
Triglycerides: 219 mg/dL — ABNORMAL HIGH (ref <150)
Triglycerides: 120 mg/dL (ref <150)
VLDL: 44 mg/dL — ABNORMAL HIGH (ref 0–40)
VLDL: 24 mg/dL (ref 0–40)

## 2011-02-10 LAB — PROTIME-INR
INR: 1 (ref 0.00–1.49)
INR: 1.08 (ref 0.00–1.49)
Prothrombin Time: 14.2 seconds (ref 11.6–15.2)

## 2011-02-10 LAB — POCT ACTIVATED CLOTTING TIME: Activated Clotting Time: 111 seconds

## 2011-02-10 LAB — TSH: TSH: 2.495 u[IU]/mL (ref 0.350–4.500)

## 2011-02-10 LAB — CK TOTAL AND CKMB (NOT AT ARMC)
CK, MB: 2.1 ng/mL (ref 0.3–4.0)
Relative Index: INVALID (ref 0.0–2.5)

## 2011-02-10 LAB — PRO B NATRIURETIC PEPTIDE: Pro B Natriuretic peptide (BNP): 279.8 pg/mL — ABNORMAL HIGH (ref 0–125)

## 2011-02-10 LAB — POCT I-STAT, CHEM 8
Calcium, Ion: 1.15 mmol/L (ref 1.12–1.32)
Chloride: 99 mEq/L (ref 96–112)
Creatinine, Ser: 1.1 mg/dL (ref 0.50–1.35)
Glucose, Bld: 105 mg/dL — ABNORMAL HIGH (ref 70–99)
Potassium: 4.3 mEq/L (ref 3.5–5.1)

## 2011-02-10 LAB — CARDIAC PANEL(CRET KIN+CKTOT+MB+TROPI)
CK, MB: 2.1 ng/mL (ref 0.3–4.0)
Troponin I: <0.3 ng/mL (ref <0.30)

## 2011-02-10 LAB — POCT I-STAT TROPONIN I

## 2011-02-10 NOTE — H&P (Signed)
NAME:  [name], [name] NO.:  [id number]  MEDICAL RECORD NO.:  [id number]  LOCATION:  2916                         FACILITY:  [facility]  PHYSICIAN:  Rollene Rotunda, MD, FACCDATE OF BIRTH:  [birth year]  DATE OF ADMISSION:  01/22/2011 DATE OF DISCHARGE:                             HISTORY & PHYSICAL   CARDIOLOGIST:  Cassell Clement, MD  REASON FOR PRESENTATION:  Evaluate the patient with chest pain.  HISTORY OF PRESENT ILLNESS:  The patient is a 70 year old white gentleman with a history of coronary artery disease.  He also has a history of chest discomfort.  He has had anxiety recently escalating. He has also had atrial flutter.  He has been seen by Dr. Graciela Husbands and has had flutter ablation scheduled a couple of times.  However, he is deferred this because of various complaints such as sore throat.  It is currently scheduled for Wednesday of this week.  He has had labile blood pressures and has called the office several times to discuss this.  Last night, he felt "swimmy headed."  He noticed his blood pressure was going up.  He had some chest discomfort.  He called the EMS, but was told he had a panic attack.  Tonight after going into his kitchen and making a sandwich, he again felt swimmy headed.  He took his blood pressure and noted it to be 190/90.  His heart rate was 60.  He thought he might be out of rhythm, but he did not feel any palpitations.  He did develop substernal chest discomfort.  It was 9/10.  It was sharp and heavy. There was no radiation.  There was no associated nausea, vomiting, or diaphoresis.  He might have been a little short of breath.  He took 4 baby aspirin and 3 sublingual nitroglycerin and called the EMS.  In the emergency room, he has had IV nitroglycerin and he is pain free.  He had no acute EKG changes.  First troponin was negative.  Otherwise, the patient has had no acute symptoms prior to the last couple of days.  He chronically sleeps  in a recliner.  He has had no new PND or orthopnea. He has had no presyncope or syncope.  He otherwise is able to do his activities of daily living without limitations.  PAST MEDICAL HISTORY: 1. Fibromyalgia.2 2. Gastroesophageal reflux disease. 3. Anxiety. 4. Hypertension. 5. Hyperlipidemia. 6. Coronary artery disease (catheterization in July 2011 with an     occluded LAD, circumflex occluded, left main 80% stenosis,     saphenous vein graft to an OM-1 patent, saphenous vein graft to     circumflex patent, LIMA to the LAD patent.  EF 45%). 7. Atrial flutter.  PAST SURGICAL HISTORY:  CABG in 1991 with a repeat in 2007, tonsillectomy, cholecystectomy, cataract surgery, left inguinal hernia repair, and hemorrhoidectomy.  ALLERGIES/INTOLERANCES: 1. SULFA. 2. TRAMADOL.  MEDICATIONS:  Xanax, Tylenol, simvastatin 40 mg daily, senna, omeprazole, nitroglycerin, multivitamin, metoprolol 50 mg b.i.d., Imdur 60 mg daily, fexofenadine, doxepin 25 mg t.i.d., calcium carbonate, buspirone 10 mg daily, aspirin 81 mg daily, amlodipine 5 mg daily, and Mucinex.  SOCIAL HISTORY:  The patient is retired [employer].  He quit smoking in 1975.  FAMILY HISTORY:  Contributory for his brother having his first bypass at 56.  His father had an MI at 2.  REVIEW OF SYSTEMS:  As stated in the HPI and otherwise negative for all other systems.  PHYSICAL EXAMINATION:  GENERAL: ; The patient is pleasant and in no distress. VITAL SIGNS:  Blood pressure 126/69, heart rate 62 and regular, afebrile, and respiratory rate 16. HEENT:  Eyelids are unremarkable.  Pupils are equal, round, and reactive to light.  Fundi not visualized.  Oral mucosa is unremarkable. NECK:  No jugular venous distention at 45 degrees.  Carotid upstrokes are brisk and symmetrical.  No bruits.  No thyromegaly. LYMPHATICS:  No cervical, axillary, or inguinal adenopathy. LUNGS:  Clear to auscultation bilaterally. BACK:  No  costovertebral angle tenderness. CHEST:  Well-healed sternotomy scar. HEART:  PMI not displaced or sustained.  S1 and S2 within normal limits. No S3, no S4, no clicks, rubs, no murmurs. ABDOMEN:  Flat, positive bowel sounds normal in frequency and pitch.  No bruits, no rebound, no guarding, no midline pulsatile mass, no hepatomegaly, no splenomegaly. SKIN:  No rashes, no nodules. EXTREMITIES:  2+ upper pulses, diminished dorsalis pedis and posterior tibialis mildly bilaterally.  No cyanosis, no clubbing, no edema. NEURO:  Oriented to person, place, and time.  Cranial nerves II-XII grossly intact.  Motor grossly intact.  EKG:  Normal sinus rhythm, rate 57, first-degree AV block, old inferior MI, no acute ST-T wave changes.  Troponin 0.01, INR 1.01.  WBC 6.2, hemoglobin 14.3, and platelets 173. Sodium 134, potassium 4.2, BUN 11, and creatinine 0.92.  Chest x-ray:  No acute disease.  ASSESSMENT/PLAN: 1. Chest, this is atypical.  He will have his usual meds continued     with IV nitroglycerin, which could probably be discontinued in the     morning.  If his enzymes are normal, he has no further pain, and no     EKG changes in the morning, we could consider further noninvasive     evaluation which could be done as an outpatient. 2. Atrial flutter.  We can discuss this with Dr. Graciela Husbands to see if he is     going to stay in the hospital for the planned ablation.     Interestingly, he is not on Coumadin.  I do not see a further     discussion of this and we will defer to Dr. Graciela Husbands. 3. Anxiety.  He should continue the meds as listed. 4. Dyslipidemia.  He will need to be switched off Zocor 40 as he is on     Norvasc.  This can be done at discharge. 5. Hypertension.  His blood pressure seems to be labile which may be     driven by anxiety.  We will certainly watch this in the hospital.     Rollene Rotunda, MD, [facility]     JH/MEDQ  D:  01/23/2011  T:  01/23/2011  Job:   [id number]  Electronically Signed by Rollene Rotunda MD [facility] on 02/10/2011 01:38:18 PM

## 2011-02-15 ENCOUNTER — Telehealth: Payer: Self-pay | Admitting: Cardiology

## 2011-02-15 NOTE — Telephone Encounter (Signed)
Agree with advice given

## 2011-02-15 NOTE — Telephone Encounter (Signed)
Pt needs to talk to you today about his b/p not being right and he said it is really important he talks to you today before you leave

## 2011-02-15 NOTE — Telephone Encounter (Signed)
Patient very concerned about his blood pressure going up to 130-140's systolic after he walks to other side of house or to the mailbox.  Does come down in about 5-10 minutes.  Advised no need to take his blood pressure more than once a day unless he feels bad.  Explained normal for blood pressure to go up a little with activity but it's good that it comes back down. Did say he was going to wait on starting back on treadmill until after he sees  Dr. Patty Sermons post cath end of the month. Did tell him [place].

## 2011-02-18 ENCOUNTER — Ambulatory Visit (HOSPITAL_COMMUNITY): Admission: RE | Admit: 2011-02-18 | Payer: Medicare Other | Source: Ambulatory Visit | Admitting: Cardiology

## 2011-02-18 ENCOUNTER — Telehealth: Payer: Self-pay | Admitting: Cardiology

## 2011-02-18 NOTE — Telephone Encounter (Signed)
Pt also taking Burspirone 10mg  qd, Xanax 0.5mg  qid prn and Gabapentin 100mg  tid.

## 2011-02-18 NOTE — Telephone Encounter (Signed)
Pt called and was out with son and became dizzy.  He went home and checked his BP and it was elevated.  Please call him back

## 2011-02-18 NOTE — Telephone Encounter (Signed)
Pt calling stating he was walking thru [employer] and had an episode of dizziness--pt is concerned that his BP went up to 162/89 after checking it when he arrived home --BP went down to 135/75 after resting for 5 minutes--advised that BP will go up with activity, even slow walking--pt is also on a lot of meds for mood elevation and depression, buspar,gabapentin,and xanax--advised this combination might effect his BP also--advised to keep an eye on BP and if elevates for no reason, please call--pt agrees--nt

## 2011-02-18 NOTE — Telephone Encounter (Signed)
[name] is calling because he became dizzy while walking through [facility] [employer] about an hour ago.  He states he was walking slow.  He has a history of a pacemaker for heart block and a heart cath at the end of Sept.  When he arrived home his BP was 162/89 P=85.  After sitting for 5 minutes, his BP was 135/75 P=75.  He is currently taking Carvedilioll 12.5mg  bid, Amlodipine 5mg  bid Isosorbide 60mg  qd.  His Metoprolol was dc'd in Sept.  No dizziness now.  It subsided when he sat down.

## 2011-02-23 ENCOUNTER — Ambulatory Visit: Payer: Medicare Other | Admitting: Nurse Practitioner

## 2011-02-23 NOTE — H&P (Signed)
NAME:  [name], [name] NO.:  [id number]  MEDICAL RECORD NO.:  [id number]  LOCATION:  [facility]                         FACILITY:  [facility]  PHYSICIAN:  Marca Ancona, MD      DATE OF BIRTH:  11/18/1940  DATE OF ADMISSION:  02/09/2011 DATE OF DISCHARGE:                             HISTORY & PHYSICAL   PRIMARY CARDIOLOGIST:  Cassell Clement, MD  HISTORY OF PRESENT ILLNESS:  This is a 70 year old with a history of coronary artery bypass grafting in 1991 and then redo bypass grafting in 2007, and recent failed atrial flutter ablation complicated by complete heart block and a pacemaker, as well as frequent episodes of atypical chest pain, who presents with recurrent chest pain.  The patient had 2 episodes of substernal chest pain last Friday and saw [name] on Tuesday in the office.  She set him up for left heart catheterization to be done next week.  However, while walking 100 yards to his mailbox today, the patient developed shortness of breath and substernal chest pain again.  He went back inside his house and the pain resolved after he took 2 sublingual nitroglycerines.  At about 10 p.m. tonight, the patient developed chest pain at rest while sitting in his chair.  This persisted for about 4 hours severely until about 2 a.m., so he came to the emergency room.  The chest pain is now improved to 1-2/10 with nitroglycerin and morphine in the emergency room.  His first set of cardiac enzymes was negative.  He is currently quite comfortable.  MEDICATIONS: 1. Aspirin 81 mg daily. 2. Buspirone 10 mg b.i.d. 3. Imdur 60 mg daily. 4. Metoprolol 50 mg b.i.d. 5. Omeprazole 20 mg b.i.d. 6. Zocor 20 mg daily. 7. Amlodipine 5 mg b.i.d.  PAST MEDICAL HISTORY: 1. Coronary artery disease.  The patient had bypass grafting in 1991     with a LIMA to the LAD, a saphenous vein graft to the obtuse     marginal, sequential saphenous vein graft to the PDA and the PLV.     He had  redo bypass grafting in February 2007 with a saphenous vein     graft to the obtuse marginal and a sequential saphenous vein graft     to the PDA and the PLV.  Last cath in July 2011 showed that his     grafts were patent and his EF was 45%.  The patient does have a     history of frequent atypical chest pain. 2. Hypertension. 3. Hyperlipidemia. 4. Gastroesophageal reflux disease. 5. Fibromyalgia. 6. Anxiety. 7. Paroxysmal atrial flutter.  The patient had attempted     radiofrequency ablation in September 2012.  This was complicated by     a complete heart block and the procedure was aborted.  He received     a [employer] Jude dual-chamber pacemaker. 8. Ischemic cardiomyopathy.  The patient had an echocardiogram in June     2012 with an EF 40-45%, inferior akinesis, grade 2 diastolic     dysfunction, and mild RV dilation.  SOCIAL HISTORY:  The patient lives in [place] by herself.  He has a daughter.  He quit smoking in 1975.  FAMILY HISTORY:  Mother had stroke.  Father had coronary artery disease.  REVIEW OF SYSTEMS:  All systems were reviewed and were negative except as noted in the history of present illness.  PHYSICAL EXAMINATION:  VITAL SIGNS:  Temperature is 98.1, pulse is 67 and regular, blood pressure 127/57, oxygen saturation 98% on room air. GENERAL:  This is a well-developed male in no apparent distress. HEENT:  Normal exam. ABDOMEN:  Soft, nontender.  No hepatosplenomegaly.  Normal bowel sounds. NECK:  No thyromegaly or thyroid nodule.  JVP is slightly elevated at 8 cm of water. CARDIOVASCULAR:  Heart; regular S1, S2.  No S3.  Soft S4.  No murmur. There are no carotid bruits.  No peripheral edema.  EXTREMITIES:  No clubbing or cyanosis. LUNGS:  Clear to auscultation bilaterally with normal respiratory effort. NEUROLOGIC:  Alert and oriented x3.  Normal affect. SKIN:  Normal exam.  RADIOLOGY:  Chest x-ray shows poor inspiratory effort.  No pneumonia. EKG is difficult  baseline, but looks like sinus rhythm with V-pacing.  LABORATORY DATA:  Hematocrit 43, potassium 4.3, creatinine 1.10.  Point- of-care troponin is negative.  IMPRESSION:  This is a 70 year old patient with a history of coronary artery disease status post bypass grafting and redo bypass grafting as well as complete heart block status post pacemaker who presented with chest pain episodes today. 1. Coronary artery disease.  The patient's chest pain may be     consistent with unstable angina.  He still has 1-2/10 chest pain     currently but he does say he feels much better.  He had been     initially planned for heart catheterization next week due to prior     chest pain episodes.  He has had 1 set of cardiac enzymes that were     negative.  His EKG is paced.  We will plan for a left heart     catheterization later this morning.  I will keep him n.p.o.  We     will put him on a heparin drip and aspirin.  He will also get     nitroglycerin paste half an inch given his ongoing chest pain. 2. Ischemic cardiomyopathy.  The patient's neck veins are very     minimally elevated, but given his history of hypertension with an  EF of 40-45%, I would stopped metoprolol and instead use Coreg 12.5     mg b.i.d.  He did have a cough with an ACE inhibitor.  I would try     to add an angiotensin receptor blocker this admission given his     depressed LV systolic function.     Marca Ancona, MD     DM/MEDQ  D:  02/10/2011  T:  02/10/2011  Job:  [id number]  Electronically Signed by Marca Ancona MD on 02/23/2011 08:16:30 PM

## 2011-02-25 NOTE — Op Note (Signed)
  NAME:  OSWALD, POTT NO.:  [id number]  MEDICAL RECORD NO.:  [id number]  LOCATION:  2918                         FACILITY:  [facility]  PHYSICIAN:  Duke Salvia, MD, FACCDATE OF BIRTH:  December 23, 1940  DATE OF PROCEDURE:  01/26/2011 DATE OF DISCHARGE:                              OPERATIVE REPORT   PREOPERATIVE DIAGNOSIS:  Atrial flutter.  POSTOPERATIVE DIAGNOSIS:  Heart block.  PROCEDURE:  Invasive electrophysiological study with coronary sinus mapping, arrhythmia mapping, and catheter ablation aborted.  Following obtained informed consent, the patient was brought to the electrophysiology laboratory and placed on the fluoroscopic table in supine position.  After routine prep and drape, cardiac catheterization was performed with local anesthesia and conscious sedation.  Noninvasive blood pressure monitoring, transcutaneous oxygen saturation monitoring were performed continuously throughout the procedure.  Following the procedure, the catheters were removed.  Hemostasis was obtained, and the patient was transferred to the CCU in stable condition with his right femoral sheath left in place (see below).  COMPLICATIONS:  The patient developed complete heart block following application of RF energy at the ventricular side of the cavotricuspid isthmus.  With overtime, there was recovery of conduction such that he had a long cycle Wenckebach 60 minutes later with a 60 - 59 kind of a ratio, it was elected to observe him overnight.  RESULTS:  Initial electrocardiogram and internal intervals first line rhythm:  Sinus; RR interval 1059 milliseconds; PR interval 325 milliseconds; P-wave duration 124 milliseconds; QRS duration 155 milliseconds; QT interval 459 milliseconds; AH interval 274 milliseconds; HV interval 54 milliseconds.  Final rhythm:  The PR interval was 380 milliseconds, QRS duration was 143, and the P-wave duration was 124.  We did not record another  HV interval.  AV nodal function:  AV Wenckebach between 500 and 600 milliseconds, there is actually 2:1 conduction at 500 milliseconds.  AV nodal conduction was continuous.  No evidence of accessory pathway was identified.  The patient was induced into atrial flutter with coronary sinus pacing. Counterclockwise rotation was noted on the electrogram maps.  The patient was transferred from the cavotricuspid isthmus confirming dependence.  Atrial cycle length was about 260 milliseconds.  Fluoroscopy time:  Unfortunately, is not recorded on the current data sheet, radiofrequency energy, a total of one application for total of 19 seconds of RF energy was applied to ventricular side of the cavotricuspid isthmus.  During this application, the patient developed complete heart block.  RF was terminated.  The Hiss catheter was advanced to the right ventricle and right ventricular pacing was initiated.  At this point, the ablation procedure was aborted.  I should note that the catheters included a 3.5 cm Thermocool catheter and applications were made at 30 watts.  The patient tolerated the procedure hemodynamically well.  Obviously, there is issue of heart block, which recovered to a large degree.  The patient was referred to the CCU for overnight observation.     Duke Salvia, MD, [facility]     SCK/MEDQ  D:  01/27/2011  T:  01/27/2011  Job:  [id number]  Electronically Signed by Sherryl Manges MD [facility] on 02/25/2011 07:25:18 AM

## 2011-02-25 NOTE — Op Note (Signed)
  NAME:  BLAYTON, HUTTNER NO.:  [id number]  MEDICAL RECORD NO.:  [id number]  LOCATION:  2918                         FACILITY:  [facility]  PHYSICIAN:  Duke Salvia, MD, FACCDATE OF BIRTH:  04-06-1941  DATE OF PROCEDURE:  01/27/2011 DATE OF DISCHARGE:                              OPERATIVE REPORT   PREOPERATIVE DIAGNOSIS:  Post ablation heart block.  POSTOPERATIVE DIAGNOSIS:  Post ablation heart block.  PROCEDURE:  Dual-chamber pacemaker implantation.  Following obtained informed consent, the patient was brought to the electrophysiology laboratory and placed on the fluoroscopic table in supine position.  After routine prep and drape of the left upper chest, lidocaine was infiltrated in prepectoral subclavicular region.  Incision was made and carried down to layer of the prepectoral fascia with electrocautery and sharp dissection, a pocket was formed similarly, hemostasis was obtained.  Thereafter, attention was turned to gain access to the extrathoracic left subclavian vein which was accomplished without difficulty without the aspiration or puncture of the artery, two separate venipunctures were accomplished.  Guidewires were placed and retained and sequentially 7-French sheaths were placed which were passed St. Jude 20 EDTC 58-cm fixation atrial lead serial number [id number] and a 20 EDTC 52-cm lead also from [facility]. Jude serial number [id number].  Under fluoroscopic guidance, these were manipulated into the right ventricular apex and the right atrial free wall respectively where the bipolar R-wave was 10.8 with a pace impedance of 786, a threshold of less than 2 volts of 0.4 milliseconds, currently threshold was 2 volts was .  There is no diaphragmatic pacing at 10 volts  with current of injury was brisk.  Bipolar P-wave was 2.4, the pace impedance of 515 a threshold. Immediately after screw deployment of 1.8 volts 0.4 milliseconds, current was 3.2 MA.  There  is no diaphragmatic pacing at 10 volts and the current of injury was brisk.  These leads were secured to prepectoral fascia and then attached to a St. Jude PM 2110 pulse generator serial number [id number].  Ventricular pacing and then P synchronous pacing were identified.  Hemostasis was obtained.  The pocket was copiously irrigated with antibiotic containing saline solution.  Hemostasis was assured and the leads and pulse generator were placed in the pocket secured to the prepectoral fascia.  The wound was closed in three layers in normal fashion.  The wound was washed, dried and a benzoin Steri-Strip was applied.  Needle count, sponge counts and instrument counts were correct at the procedure according to staff.  The patient tolerated the procedure without apparent complication.    Duke Salvia, MD, [facility]    SCK/MEDQ  D:  01/27/2011  T:  01/27/2011  Job:  [id number]  Electronically Signed by Sherryl Manges MD [facility] on 02/25/2011 07:25:23 AM

## 2011-02-27 NOTE — Telephone Encounter (Signed)
Agree with advice given

## 2011-02-28 ENCOUNTER — Other Ambulatory Visit: Payer: Medicare Other | Admitting: *Deleted

## 2011-02-28 ENCOUNTER — Telehealth: Payer: Self-pay | Admitting: Cardiology

## 2011-02-28 ENCOUNTER — Ambulatory Visit (INDEPENDENT_AMBULATORY_CARE_PROVIDER_SITE_OTHER): Payer: Medicare Other | Admitting: Cardiology

## 2011-02-28 ENCOUNTER — Encounter: Payer: Self-pay | Admitting: Cardiology

## 2011-02-28 VITALS — BP 110/70 | HR 70 | Ht 71.0 in | Wt 194.0 lb

## 2011-02-28 DIAGNOSIS — E7800 Pure hypercholesterolemia, unspecified: Secondary | ICD-10-CM

## 2011-02-28 DIAGNOSIS — E785 Hyperlipidemia, unspecified: Secondary | ICD-10-CM

## 2011-02-28 DIAGNOSIS — Z9889 Other specified postprocedural states: Secondary | ICD-10-CM

## 2011-02-28 DIAGNOSIS — R05 Cough: Secondary | ICD-10-CM

## 2011-02-28 DIAGNOSIS — I4892 Unspecified atrial flutter: Secondary | ICD-10-CM

## 2011-02-28 DIAGNOSIS — Z951 Presence of aortocoronary bypass graft: Secondary | ICD-10-CM

## 2011-02-28 DIAGNOSIS — I2589 Other forms of chronic ischemic heart disease: Secondary | ICD-10-CM

## 2011-02-28 DIAGNOSIS — F419 Anxiety disorder, unspecified: Secondary | ICD-10-CM

## 2011-02-28 DIAGNOSIS — Z950 Presence of cardiac pacemaker: Secondary | ICD-10-CM

## 2011-02-28 DIAGNOSIS — I255 Ischemic cardiomyopathy: Secondary | ICD-10-CM

## 2011-02-28 DIAGNOSIS — I10 Essential (primary) hypertension: Secondary | ICD-10-CM

## 2011-02-28 DIAGNOSIS — I119 Hypertensive heart disease without heart failure: Secondary | ICD-10-CM

## 2011-02-28 LAB — LIPID PANEL
Cholesterol: 142 mg/dL (ref 0–200)
HDL: 39.9 mg/dL (ref >39.00)
LDL Cholesterol: 76 mg/dL (ref 0–99)
Triglycerides: 133 mg/dL (ref 0.0–149.0)
VLDL: 26.6 mg/dL (ref 0.0–40.0)

## 2011-02-28 LAB — BASIC METABOLIC PANEL
BUN: 12 mg/dL (ref 6–23)
Chloride: 102 mEq/L (ref 96–112)
GFR: 65.41 mL/min (ref >60.00)
Potassium: 4.3 mEq/L (ref 3.5–5.1)

## 2011-02-28 LAB — CBC WITH DIFFERENTIAL/PLATELET
Basophils Absolute: 0 10*3/uL (ref 0.0–0.1)
Basophils Relative: 0.4 % (ref 0.0–3.0)
Eosinophils Absolute: 0.2 10*3/uL (ref 0.0–0.7)
Lymphocytes Relative: 27.4 % (ref 12.0–46.0)
MCHC: 33.9 g/dL (ref 30.0–36.0)
MCV: 94.5 fl (ref 78.0–100.0)
Monocytes Absolute: 0.4 10*3/uL (ref 0.1–1.0)
Neutrophils Relative %: 63.9 % (ref 43.0–77.0)
Platelets: 208 10*3/uL (ref 150.0–400.0)
RBC: 4.51 Mil/uL (ref 4.22–5.81)
RDW: 12.9 % (ref 11.5–14.6)

## 2011-02-28 LAB — HEPATIC FUNCTION PANEL
AST: 19 U/L (ref 0–37)
Bilirubin, Direct: 0 mg/dL (ref 0.0–0.3)
Total Bilirubin: 0.6 mg/dL (ref 0.3–1.2)

## 2011-02-28 MED ORDER — ALPRAZOLAM 1 MG PO TABS: 1.0000 mg | ORAL_TABLET | Freq: Three times a day (TID) | ORAL | Status: DC | PRN

## 2011-02-28 MED ORDER — NITROGLYCERIN 0.4 MG SL SUBL: 0.4000 mg | SUBLINGUAL_TABLET | SUBLINGUAL | Status: DC | PRN

## 2011-02-28 MED ORDER — ATORVASTATIN CALCIUM 40 MG PO TABS: 40.0000 mg | ORAL_TABLET | Freq: Every day | ORAL | Status: DC

## 2011-02-28 NOTE — Assessment & Plan Note (Signed)
The patient remains extremely anxious.  He understands that his anxiety plays a role in his somatic symptoms.  He remains on alprazolam 1 mg 3 times a day, and we refilled that medication.  Today

## 2011-02-28 NOTE — Telephone Encounter (Signed)
Called and discussed heart rate with patient,  Concerned when it goes up to 90 with activity.  Is going to increase his exercising slowly as discussed with  Dr. Patty Sermons at office visit

## 2011-02-28 NOTE — Patient Instructions (Signed)
Discontinue your Simvastatin (Zocor) and start Lipitor 40 mg daily. Will call this in as well as refilling your Xanax and NTG.  Follow up in 2 months with Lawson Fiscal NP for office visit and EKG.  Will obtain labs today and call with the results

## 2011-02-28 NOTE — Telephone Encounter (Signed)
Pt calling to ask Dr. Patty Sermons a question. Please return pt call to discuss further.

## 2011-02-28 NOTE — Progress Notes (Signed)
[name] Date of Birth:  Dec 03, 1940 [facility] / [facility] [address]   [address] [place], [place]  [address] [phone]           Fax   [phone]  History of Present Illness: This pleasant 70 year old gentleman is seen for a scheduled followup office visit.  He has a complex past medical history per is a history of known ischemic heart disease.  The head CABG in 1991 and redo CABG in 2007.  He had a cardiac catheterization on 10/30/09 showing that his grafts were patent.  The patient had an ablation for atrial flutter in October 2012.  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  Since his last visit.  The patient has had several episodes of what he describes as panic attacks during which his blood pressure rises and his pulse increases.  He can usually attribute the panic attacks to stress regarding his wife, who is in a nursing home and who has been difficult.  Current Outpatient Prescriptions  Medication Sig Dispense Refill  . acetaminophen (TYLENOL) 325 MG tablet Take 650 mg by mouth every 6 (six) hours as needed.        . ALPRAZolam (XANAX) 1 MG tablet 1 mg 3 (three) times daily. Twice daily or as directed       . amLODipine (NORVASC) 5 MG tablet Take 1 tablet (5 mg total) by mouth 2 (two) times daily.  60 tablet  11  . aspirin 81 MG EC tablet Take 81 mg by mouth daily.        . busPIRone (BUSPAR) 10 MG tablet Take 10 mg by mouth 2 (two) times daily.        . Calcium Carbonate-Vitamin D (CALCIUM + D PO) Take by mouth daily.        . carvedilol (COREG) 12.5 MG tablet Take 12.5 mg by mouth 2 (two) times daily with a meal.        . Docusate Sodium (COLACE PO) Take by mouth as needed.        . fexofenadine (ALLEGRA) 60 MG tablet Take 60 mg by mouth as needed.        . gabapentin (NEURONTIN) 100 MG capsule Take 100 mg by mouth 3 (three) times daily.        . isosorbide mononitrate  (IMDUR) 60 MG 24 hr tablet Take 60 mg by mouth daily.        . Multiple Vitamin (MULTIVITAMIN) tablet Take 1 tablet by mouth daily.        . nitroGLYCERIN (NITROSTAT) 0.4 MG SL tablet Place 1 tablet (0.4 mg total) under the tongue every 5 (five) minutes as needed.  100 tablet  prn  . omeprazole (PRILOSEC) 20 MG capsule Take 20 mg by mouth 2 (two) times daily.        . sucralfate (CARAFATE) 1 G tablet Take 1 g by mouth as needed.        [name] Kitchen DISCONTD: nitroGLYCERIN (NITROSTAT) 0.4 MG SL tablet Place 0.4 mg under the tongue every 5 (five) minutes as needed.        [name] Kitchen atorvastatin (LIPITOR) 40 MG tablet Take 1 tablet (40 mg total) by mouth daily.  90 tablet  3    Allergies  Allergen Reactions  . Sulfa Drugs Cross Reactors   . Ultram (Tramadol Hcl)     Patient Active Problem List  Diagnoses  . Ischemic cardiomyopathy  .  Hypertension  . Hyperlipidemia  . GERD (gastroesophageal reflux disease)  . Anxiety  . Fibromyalgia  . Hx of CABG  . Cough, persistent  . Paroxysmal atrial flutter    History  Smoking status  . Former Smoker  . Quit date: 09/21/1973  Smokeless tobacco  . Not on file    History  Alcohol Use No    Family History  Problem Relation Age of Onset  . Stroke Mother   . Heart attack Father     Review of Systems: Constitutional: no fever chills diaphoresis or fatigue or change in weight.  Head and neck: no hearing loss, no epistaxis, no photophobia or visual disturbance. Respiratory: No cough, shortness of breath or wheezing. Cardiovascular: No chest pain peripheral edema, palpitations. Gastrointestinal: No abdominal distention, no abdominal pain, no change in bowel habits hematochezia or melena. Genitourinary: No dysuria, no frequency, no urgency, no nocturia. Musculoskeletal:No arthralgias, no back pain, no gait disturbance or myalgias. Neurological: No dizziness, no headaches, no numbness, no seizures, no syncope, no weakness, no tremors. Hematologic: No  lymphadenopathy, no easy bruising. Psychiatric: No confusion, no hallucinations, no sleep disturbance.    Physical Exam: Filed Vitals:   02/28/11 0854  BP: 110/70  Pulse: 70   the general appearance reveals an anxious, middle-aged gentleman in no acute distress.Pupils equal and reactive.   Extraocular Movements are full.  There is no scleral icterus.  The mouth and pharynx are normal.  The neck is supple.  The carotids reveal no bruits.  The jugular venous pressure is normal.  The thyroid is not enlarged.  There is no lymphadenopathy.  The chest is clear to percussion and auscultation. There are no rales or rhonchi. Expansion of the chest is symmetrical.  The precordium is quiet.  The first heart sound is normal.  The second heart sound is physiologically split.  There is no murmur gallop rub or click.  There is no abnormal lift or heave.  The abdomen is soft and nontender. Bowel sounds are normal. The liver and spleen are not enlarged. There Are no abdominal masses. There are no bruits.  The pedal pulses are good.  There is no phlebitis or edema.  There is no cyanosis or clubbing. Strength is normal and symmetrical in all extremities.  There is no lateralizing weakness.  There are no sensory deficits.The skin is warm and dry.  There is no rash.    Assessment / Plan: Continue same medication except we are switching him from simvastatin to generic Lipitor    .

## 2011-02-28 NOTE — Assessment & Plan Note (Signed)
The patient has had occasional chest discomfort requiring sublingual nitroglycerin.  Fortunately, the nitroglycerin is usually rapidly effective in alleviating his chest pain.

## 2011-02-28 NOTE — Assessment & Plan Note (Signed)
Patient has not been aware of any sustained tachycardia.  At times his pulse rate when he tells it would be in the range of 85

## 2011-02-28 NOTE — Assessment & Plan Note (Signed)
The patient has a history of hypercholesterolemia.  Because the patient is on amlodipine.  We have had to reduce the dose of his Zocor.  The present dose of Zocor is not likely to be effective.  We will switch back to generic Lipitor 40 mg 1 daily and stop the simvastatin.

## 2011-03-01 NOTE — Cardiovascular Report (Signed)
  NAME:  [name], [name] NO.:  [id number]  MEDICAL RECORD NO.:  [id number]  LOCATION:  2006                         FACILITY:  [facility]  PHYSICIAN:  Rollene Rotunda, MD, FACCDATE OF BIRTH:  [birth year]  DATE OF PROCEDURE: DATE OF DISCHARGE:                           CARDIAC CATHETERIZATION   PROCEDURE:  Left heart catheterization/coronary arteriography.  INDICATION:  The patient with chest pain and previous CABG and redo.  PROCEDURE NOTE:  Left heart catheterization performed in the right femoral artery.  The artery was cannulated using anterior wall puncture. A #5-French arterial sheath was inserted via the modified Seldinger technique.  Preformed Judkins and a pigtail catheter were utilized.  The patient tolerated the procedure well, left the lab in stable condition.  RESULTS:  Hemodynamics:  LV 132/8, AO 134/64.  Coronaries:  Left main had ostial 80% stenosis.  The LAD was occluded in the proximal vessel.  The distal vessel filled via the LIMA.  The mid and distal vessel were free of high-grade disease.  The circumflex was occluded at the ostium.  There was a large obtuse marginal which was occluded and filled via vein graft.  The distal circumflex filled via collaterals both from the circumflex and right coronary.  The right coronary artery was a large dominant vessel occluded at the ostium.  The distal vessel was free of high-grade disease.  It included a large PDA. Grafts from his original surgery, a LIMA was patent to his LAD. Previous saphenous vein grafts were known to be occluded from his original surgery.  However, redo saphenous vein graft to the PDA was widely patent.  Again there was excellent right to left collateral flow to the circumflex.  The saphenous vein graft to the circumflex obtuse marginal was widely patent.  Left ventriculogram:  The left ventriculogram was obtained in the RAO projection.  The EF was about 40% with global hypokinesis and  perhaps some slightly more evident inferior akinesis.  CONCLUSION:  Severe native 3-vessel coronary artery disease.  The LIMA graft and the 2 most recent vein grafts are widely patent.  He has good perfusion through these grafts.  PLAN:  The patient will continue to have medical management.  I discussed this with Dr. Patty Sermons.  He will continue to have aggressive risk reduction.    Rollene Rotunda, MD, [facility]    JH/MEDQ  D:  02/10/2011  T:  02/10/2011  Job:  [id number]  Electronically Signed by Rollene Rotunda MD [facility] on 03/01/2011 05:45:11 PM

## 2011-03-01 NOTE — Discharge Summary (Signed)
NAME:  [name], [name] NO.:  [id number]  MEDICAL RECORD NO.:  [id number]  LOCATION:  2006                         FACILITY:  [facility]  PHYSICIAN:  Rollene Rotunda, MD, FACCDATE OF BIRTH:  1940/10/30  DATE OF ADMISSION:  02/09/2011 DATE OF DISCHARGE:  02/10/2011                              DISCHARGE SUMMARY   PRIMARY CARDIOLOGIST:  Cassell Clement, MD  DISCHARGE DIAGNOSIS:  Chest pain with objective evidence of ischemia.  SECONDARY DIAGNOSES: 1. Coronary artery disease, status post prior coronary artery bypass     grafting. 2. Hypertension. 3. Hyperlipidemia. 4. Gastroesophageal reflux disease. 5. Fibromyalgia. 6. Anxiety. 7. History of paroxysmal atrial flutter, status post attempted     radiofrequency catheter ablation complicated by complete heart     block requiring pacemaker placement. 8. Ischemic cardiomyopathy with an ejection fraction of 40% by left     ventriculography this admission.  ALLERGIES:  SULFA and TRAMADOL.  PROCEDURES:  Left heart cardiac catheterization performed on February 10, 2011, revealing an 80% left main stenosis, total occlusion of the LAD, left circumflex, first obtuse marginal, and right coronary artery.  The LIMA to the LAD was widely patent as was the vein graft to the PDA and vein graft to the obtuse marginal.  EF was 40% with global hypokinesis.  HISTORY OF PRESENT ILLNESS:  A 70 year old male with prior history of coronary artery disease, status post prior coronary artery bypass grafting, who was recently seen in clinic with complaints of substernal chest discomfort.  Because of progressive symptoms, the patient was set up for an outpatient cardiac catheterization, which was to be done next week.  However on the morning of admission, the patient developed recurrent exertional substernal chest discomfort associated with dyspnea that resolved after 2 sublingual nitroglycerin tablets.  On the evening of admission, the  patient had recurrent chest pain while resting in his chair.  Symptoms persisted for about 4-6 hours, and he presented to the [facility] ED where he was treated with nitroglycerin and morphine with some improvement in pain.  EKG shows no acute changes while point-of-care troponin was negative.  He was admitted for evaluation.  HOSPITAL COURSE:  Subsequent cardiac enzymes remained negative.  As the patient had previously been scheduled for outpatient catheterization, decision was made to pursue inpatient catheterization today.  This was performed showing severe multivessel native coronary artery disease, but with 4/4 patent grafts, (LIMA to the LAD, vein graft to the obtuse marginal, sequential vein graft to the PDA and PLV).  The patient has had no recurrent chest pain.  Continue medical therapy was recommended. Given his history of ischemic cardiomyopathy with an EF 40%, we have switched him from short-acting metoprolol to carvedilol therapy.  He will be discharged home today in good condition.  We will arrange for followup in our office in approximately 2 weeks.  DISCHARGE LABS:  Hemoglobin 14.6, hematocrit 43.0.  INR 1.08.  Sodium 135, potassium 4.3, chloride 99, BUN 12, creatinine 1.0, glucose 105. CK 39, MB 2.1, troponin I less than 0.30.  BNP 279.8.  Total cholesterol 149, triglycerides 120, HDL 40, LDL 85.  TSH 2.495.  DISPOSITION:  The patient will be discharged home today in good  condition.  FOLLOWUP PLANS AND APPOINTMENTS:  The patient will follow up with Dante Gang, nurse practitioner at [facility] in approximately 2 weeks.  DISCHARGE MEDICATIONS: 1. Carvedilol 12.5 mg b.i.d. 2. Nitroglycerin 0.4 mg sublingual p.r.n. chest pain. 3. Amlodipine 5 mg b.i.d. 4. Aspirin 81 mg daily. 5. Buspirone 10 mg b.i.d. 6. Calcium carbonate plus D 1 tab daily. 7. Doxepin 25 mg t.i.d. 8. Fexofenadine 60 mg daily p.r.n. 9. Imdur 60 mg daily. 10.Mucinex 600 mg q.4 hours  p.r.n. 11.Multivitamin 1 tab daily. 12.Omeprazole 20 mg b.i.d. 13.Senna 1 tab daily p.r.n. 14.Tylenol Extra Strength 500 mg 1-2 tabs q.6 hours p.r.n. 15.Xanax 1 mg t.i.d. p.r.n. 16.Zocor 20 mg at bedtime.  OUTSTANDING LABS AND STUDIES:  None.  DURATION OF DISCHARGE ENCOUNTER:  Thirty-five minutes including physician time.     Nicolasa Ducking, ANP   ______________________________ Rollene Rotunda, MD, [facility]    CB/MEDQ  D:  02/10/2011  T:  02/11/2011  Job:  [id number]  Electronically Signed by Nicolasa Ducking ANP on 02/22/2011 04:16:16 PM Electronically Signed by Rollene Rotunda MD [facility] on 03/01/2011 05:45:09 PM

## 2011-03-03 NOTE — Discharge Summary (Signed)
NAME:  [name], [name] NO.:  [id number]  MEDICAL RECORD NO.:  [id number]  LOCATION:  2918                         FACILITY:  [facility]  PHYSICIAN:  Hillis Range, MD       DATE OF BIRTH:  1941/01/23  DATE OF ADMISSION:  01/26/2011 DATE OF DISCHARGE:  01/28/2011                              DISCHARGE SUMMARY   PROCEDURES: 1. Invasive electrophysiological study with coronary sinus mapping. 2. Arrhythmia mapping. 3. Catheter ablation attempted but aborted. 4. Insertion of a dual-chamber St. Jude 2110PM pulse generator.  PRIMARY FINAL DISCHARGE DIAGNOSES: 1. Post ablation heart block. 2. Atrial flutter.  SECONDARY DIAGNOSES: 1. Fibromyalgia. 2. Gastroesophageal reflux disease. 3. Anxiety. 4. Hypertension. 5. Hyperlipidemia. 6. Status post aortocoronary bypass surgery in 1991 with left internal     mammary artery to left anterior descending, saphenous vein graft to     obtuse marginal and circumflex, saphenous vein graft to posterior     descending artery and posterolateral artery. 7. Redo bypass surgery in 2007 with saphenous vein graft to obtuse     marginal, saphenous vein graft to posterior descending artery and     posterolateral artery. 8. Cardiac catheterization in July 2011 with left anterior descending     and right coronary artery total, patent graft. 9. Ischemic cardiomyopathy with an ejection fraction of 40-45% by echo     in July 2012. 10.Allergy or intolerance to SULFA and ULTRAM. 11.Status post tonsillectomy, cholecystectomy, cataract surgery,     hernia repair, and hemorrhoidectomy. 12.Family history of coronary artery disease in his brother and     father.  TIME AT DISCHARGE:  33 minutes.  HOSPITAL COURSE:  [name] is a 70 year old male with a history of coronary artery disease.  He had an admission for chest pain in September 2012 and was noted to have atrial flutter.  He was discharged on the 24th and came back to the hospital on  the 26th for electrophysiology study and possible ablation.  The electrophysiology study was done but [name] developed complete heart block following application of RF energy at the ventricular side of the cavotricuspid isthmus.  There was recovery of conduction such that he had a long cycle Wenckebach an hour later.  He was observed overnight.  He was noted to be in Mobitz II heart block the next day with a ventricular rate in the 30s.  He felt fatigued, but had no chest pain.  Dr. Graciela Husbands reviewed the data and felt that permanent pacemaker was indicated.  He had a St. Jude dual lead pacemaker inserted on January 27, 2011 without complication.  On January 28, 2011, his pacemaker was in place without pneumothorax. A followup pacer check showed normal device function.  He was evaluated by Dr. Johney Frame and considered stable for discharge to follow up as an outpatient.  DISCHARGE INSTRUCTIONS:  His activity level is to be increased gradually per the discharge instruction sheet.  He is to call our office for problems with the wound.  He is to get a wound check on October 11 at 10:30.  He is to follow up with Dr. Patty Sermons on October 29 at 9:00 a.m. and with Dr. Graciela Husbands in  January 2013.  He is to follow up with Dr. Christell Constant as needed.  DISCHARGE MEDICATIONS: 1. Extra Strength Tylenol 1-2 tablets q.6 h. P.r.n. 2. Doxepin 25 mg t.i.d. 3. Buspirone 10 mg b.i.d. 4. Xanax 1 mg t.i.d. 5. Lopressor 50 mg b.i.d. 6. Senokot daily p.r.n. 7. Amlodipine 5 mg a day. 8. Mucinex 600 mg q.4 h. p.r.n. 9. Zocor 20 mg a day. 10.Multivitamins daily. 11.Imdur 60 mg a day. 12.Sublingual nitroglycerin p.r.n. 13.Aspirin 81 mg a day. 14.Omeprazole 20 mg b.i.d. 15.Calcium plus D OTC daily. 16.Fexofenadine 80 mg daily p.r.n.     Theodore Demark, PA-C   ______________________________ Hillis Range, MD    RB/MEDQ  D:  01/28/2011  T:  01/28/2011  Job:  [id number]  cc:   Ernestina Penna,  M.D.  Electronically Signed by Theodore Demark PA-C on 02/07/2011 06:55:10 AM Electronically Signed by Hillis Range MD on 03/03/2011 02:30:15 PM

## 2011-03-04 ENCOUNTER — Telehealth: Payer: Self-pay | Admitting: *Deleted

## 2011-03-04 NOTE — Telephone Encounter (Signed)
Advised of results

## 2011-03-04 NOTE — Telephone Encounter (Signed)
Message copied by Burnell Blanks on Fri Mar 04, 2011 11:55 AM ------      Message from: Cassell Clement      Created: Mon Feb 28, 2011  9:01 PM       Blood tests all good.  Stay on your same medicines      Stay active.

## 2011-03-04 NOTE — Progress Notes (Signed)
Advised 

## 2011-03-30 ENCOUNTER — Other Ambulatory Visit: Payer: Self-pay | Admitting: *Deleted

## 2011-03-30 MED ORDER — SUCRALFATE 1 G PO TABS: 1.0000 g | ORAL_TABLET | ORAL | Status: DC | PRN

## 2011-04-10 ENCOUNTER — Observation Stay (HOSPITAL_COMMUNITY)
Admission: EM | Admit: 2011-04-10 | Discharge: 2011-04-11 | Disposition: A | Discharge status: Home / Self Care | Payer: Medicare Other | Source: Ambulatory Visit | Attending: Cardiology | Admitting: Cardiology

## 2011-04-10 ENCOUNTER — Encounter (HOSPITAL_COMMUNITY): Payer: Self-pay | Admitting: Emergency Medicine

## 2011-04-10 DIAGNOSIS — R0989 Other specified symptoms and signs involving the circulatory and respiratory systems: Secondary | ICD-10-CM | POA: Insufficient documentation

## 2011-04-10 DIAGNOSIS — R079 Chest pain, unspecified: Secondary | ICD-10-CM | POA: Insufficient documentation

## 2011-04-10 DIAGNOSIS — Z950 Presence of cardiac pacemaker: Secondary | ICD-10-CM | POA: Insufficient documentation

## 2011-04-10 DIAGNOSIS — R0609 Other forms of dyspnea: Secondary | ICD-10-CM | POA: Insufficient documentation

## 2011-04-10 DIAGNOSIS — E7800 Pure hypercholesterolemia, unspecified: Secondary | ICD-10-CM

## 2011-04-10 DIAGNOSIS — Z01810 Encounter for preprocedural cardiovascular examination: Secondary | ICD-10-CM

## 2011-04-10 DIAGNOSIS — I255 Ischemic cardiomyopathy: Secondary | ICD-10-CM

## 2011-04-10 DIAGNOSIS — IMO0001 Reserved for inherently not codable concepts without codable children: Secondary | ICD-10-CM | POA: Insufficient documentation

## 2011-04-10 DIAGNOSIS — K219 Gastro-esophageal reflux disease without esophagitis: Secondary | ICD-10-CM | POA: Insufficient documentation

## 2011-04-10 DIAGNOSIS — E785 Hyperlipidemia, unspecified: Secondary | ICD-10-CM | POA: Insufficient documentation

## 2011-04-10 DIAGNOSIS — I10 Essential (primary) hypertension: Secondary | ICD-10-CM | POA: Insufficient documentation

## 2011-04-10 DIAGNOSIS — Z951 Presence of aortocoronary bypass graft: Secondary | ICD-10-CM | POA: Insufficient documentation

## 2011-04-10 DIAGNOSIS — F419 Anxiety disorder, unspecified: Secondary | ICD-10-CM | POA: Insufficient documentation

## 2011-04-10 DIAGNOSIS — I119 Hypertensive heart disease without heart failure: Secondary | ICD-10-CM

## 2011-04-10 DIAGNOSIS — R0789 Other chest pain: Principal | ICD-10-CM | POA: Insufficient documentation

## 2011-04-10 DIAGNOSIS — I2510 Atherosclerotic heart disease of native coronary artery without angina pectoris: Secondary | ICD-10-CM | POA: Insufficient documentation

## 2011-04-10 HISTORY — DX: Diaphragmatic hernia without obstruction or gangrene: K44.9

## 2011-04-10 HISTORY — DX: Major depressive disorder, single episode, unspecified: F32.9

## 2011-04-10 HISTORY — DX: Depression, unspecified: F32.A

## 2011-04-10 HISTORY — DX: Cyst of kidney, acquired: N28.1

## 2011-04-10 MED ORDER — NITROGLYCERIN 2 % TD OINT
1.0000 [in_us] | TOPICAL_OINTMENT | Freq: Once | TRANSDERMAL | Status: AC
  Administered 2011-04-11: 1 [in_us] via TOPICAL
  Filled 2011-04-10: qty 1

## 2011-04-10 NOTE — ED Notes (Signed)
Pt states was cleaning a shower chair and began having CP. Pt states called ems. Pt took aspirin pta of ems. Pt given 2 SL ntg by ems. Pt currently pain free. Pt denies sob. No distress noted.

## 2011-04-10 NOTE — ED Provider Notes (Signed)
History     CSN: [id number] Arrival date & time: 04/10/2011 11:23 PM   First MD Initiated Contact with Patient 04/10/11 2334      Chief Complaint  Patient presents with  . Chest Pain    pt states was working around house and developed chest pain. pt c/o sob. pt took aspirin at home. received 2 ntg tabs by ems    (Consider location/radiation/quality/duration/timing/severity/associated sxs/prior treatment) Patient is a 70 y.o. male presenting with chest pain. The history is provided by the patient. No language interpreter was used.  Chest Pain The chest pain began 3 - 5 hours ago. Chest pain occurs constantly. The chest pain is improving. The pain is associated with exertion. The severity of the pain is moderate. The quality of the pain is described as aching, similar to previous episodes and pressure-like. The pain does not radiate. Chest pain is worsened by exertion (states began when helping install a peice of furniture). Primary symptoms include shortness of breath. Pertinent negatives for primary symptoms include no fever, no fatigue, no cough, no palpitations, no abdominal pain, no nausea, no vomiting and no dizziness.  Pertinent negatives for associated symptoms include no lower extremity edema, no near-syncope, no numbness, no orthopnea, no paroxysmal nocturnal dyspnea and no weakness. He tried nitroglycerin and aspirin for the symptoms. Risk factors include male gender.  His past medical history is significant for CAD, CHF and MI.  Procedure history is positive for cardiac catheterization and echocardiogram.     Past Medical History  Diagnosis Date  . Ischemic cardiomyopathy     EF 45%  . Hypertension   . Hyperlipidemia   . GERD (gastroesophageal reflux disease)   . Anxiety   . Fibromyalgia   . CAD (coronary artery disease)     prior CABG in 1991with redo surgery in 2007  . S/P cardiac cath July 2011    Grafts patent. Mild to moderate LV dysfunction. Managed medically  .  Atrial flutter October 2012    S/P ablation - not successful  . CHB (complete heart block) October 2012    S/P PTVP following ablation    Past Surgical History  Procedure Date  . Cardiac catheterization 10/30/2009    Grafts patent. Mild to moderate LV dysfunction. EF 45%. Managed medically.   . Coronary artery bypass graft     Originial surgery in 1991 with LIMA to LAD, SVG to OM/LCX, SVG to PD & PL; Redo surgery in 2007 with SVG to OM and SVG to PD & PL  . Hernia repair   . Tonsillectomy     Family History  Problem Relation Age of Onset  . Stroke Mother   . Heart attack Father     History  Substance Use Topics  . Smoking status: Former Smoker    Quit date: 09/21/1973  . Smokeless tobacco: Not on file  . Alcohol Use: No      Review of Systems  Constitutional: Negative for fever, activity change, appetite change and fatigue.  HENT: Negative for congestion, sore throat, rhinorrhea, neck pain and neck stiffness.   Respiratory: Positive for shortness of breath. Negative for cough and chest tightness.   Cardiovascular: Positive for chest pain. Negative for palpitations, orthopnea and near-syncope.  Gastrointestinal: Negative for nausea, vomiting and abdominal pain.  Genitourinary: Negative for dysuria, urgency, frequency and flank pain.  Neurological: Negative for dizziness, weakness, light-headedness, numbness and headaches.  All other systems reviewed and are negative.    Allergies  Sulfa drugs cross reactors  and Ultram  Home Medications   Current Outpatient Rx  Name Route Sig Dispense Refill  . ACETAMINOPHEN 325 MG PO TABS Oral Take 650 mg by mouth every 6 (six) hours as needed.      . ALPRAZOLAM 1 MG PO TABS Oral Take 1 tablet (1 mg total) by mouth 3 (three) times daily as needed for sleep. 90 tablet 5  . AMLODIPINE BESYLATE 5 MG PO TABS Oral Take 1 tablet (5 mg total) by mouth 2 (two) times daily. 60 tablet 11  . ASPIRIN 81 MG PO TBEC Oral Take 81 mg by mouth  daily.      . ATORVASTATIN CALCIUM 40 MG PO TABS Oral Take 1 tablet (40 mg total) by mouth daily. 90 tablet 3  . CALCIUM + D PO Oral Take by mouth daily.      [name] Kitchen CARVEDILOL 12.5 MG PO TABS Oral Take 12.5 mg by mouth 2 (two) times daily with a meal.      . COLACE PO Oral Take by mouth as needed.      [name] Kitchen FEXOFENADINE HCL 60 MG PO TABS Oral Take 60 mg by mouth as needed.      [name] Kitchen GABAPENTIN 100 MG PO CAPS Oral Take 100 mg by mouth 4 (four) times daily.     . ISOSORBIDE MONONITRATE ER 60 MG PO TB24 Oral Take 60 mg by mouth daily.      [name] Kitchen ONE-DAILY MULTI VITAMINS PO TABS Oral Take 1 tablet by mouth daily.      [name] Kitchen OMEPRAZOLE 20 MG PO CPDR Oral Take 20 mg by mouth 2 (two) times daily.      [name] Kitchen NITROGLYCERIN 0.4 MG SL SUBL Sublingual Place 1 tablet (0.4 mg total) under the tongue every 5 (five) minutes as needed. 100 tablet prn    BP 124/92  Pulse 67  SpO2 97%  Physical Exam  Nursing note and vitals reviewed. Constitutional: He is oriented to person, place, and time. He appears well-developed and well-nourished. No distress.  HENT:  Head: Normocephalic and atraumatic.  Mouth/Throat: Oropharynx is clear and moist. No oropharyngeal exudate.  Eyes: Conjunctivae and EOM are normal. Pupils are equal, round, and reactive to light.  Neck: Normal range of motion. Neck supple.  Cardiovascular: Normal rate, regular rhythm, normal heart sounds and intact distal pulses.  Exam reveals no gallop and no friction rub.   No murmur heard. Pulmonary/Chest: Effort normal and breath sounds normal. No respiratory distress. He exhibits no tenderness.       Prior CABG and pacer placement  Abdominal: Soft. Bowel sounds are normal. There is no tenderness.  Musculoskeletal: Normal range of motion. He exhibits no tenderness.  Neurological: He is alert and oriented to person, place, and time. No cranial nerve deficit.  Skin: Skin is warm and dry.    ED Course  Procedures (including critical care time)   Date: 04/10/2011   Rate: 65  Rhythm: normal sinus rhythm  QRS Axis: indeterminate  Intervals: normal  ST/T Wave abnormalities: normal  Conduction Disutrbances:paced complexes  Narrative Interpretation:   Old EKG Reviewed: unchanged  Labs Reviewed  BASIC METABOLIC PANEL - Abnormal; Notable for the following:    Glucose, Bld 112 (*)    GFR calc non Af Amer 74 (*)    GFR calc Af Amer 86 (*)    All other components within normal limits  CBC  DIFFERENTIAL  APTT  PROTIME-INR  POCT I-STAT TROPONIN I  I-STAT TROPONIN I   Dg Chest 2 View  04/11/2011  *RADIOLOGY REPORT*  Clinical Data: Chest pain.  Difficulty breathing.  CHEST - 2 VIEW  Comparison: 02/10/2011  Findings: Stable appearance of cardiac pacemaker and postoperative changes since the previous study.  Normal heart size and pulmonary vascularity.  Old right rib fracture.  No focal airspace consolidation in the lungs.  No blunting of costophrenic angles. No pneumothorax.  No significant changes since the previous study.  IMPRESSION: No evidence of active pulmonary disease.  Original Report Authenticated By: Marlon Pel, M.D.     1. Anxiety   2. Pure hypercholesterolemia   3. Benign hypertensive heart disease without heart failure   4. Preop cardiovascular exam   5. Chest pain   6. Ischemic cardiomyopathy   7. Hypertension   8. Hyperlipidemia       MDM  Patient with significant cardiac history presents with a episode similar to prior angina. EKG and blood work is relatively unremarkable. Pain-free with nitroglycerin. I discussed the case with his on-call cardiologist who will limit the patient for further evaluation and treatment.        Dayton Bailiff, MD 04/11/11 [phone]

## 2011-04-11 ENCOUNTER — Encounter (HOSPITAL_COMMUNITY): Payer: Self-pay | Admitting: *Deleted

## 2011-04-11 ENCOUNTER — Emergency Department (HOSPITAL_COMMUNITY): Payer: Medicare Other

## 2011-04-11 DIAGNOSIS — R079 Chest pain, unspecified: Secondary | ICD-10-CM

## 2011-04-11 LAB — BASIC METABOLIC PANEL
BUN: 11 mg/dL (ref 6–23)
CO2: 27 mEq/L (ref 19–32)
Chloride: 102 mEq/L (ref 96–112)
Creatinine, Ser: 1 mg/dL (ref 0.50–1.35)
Glucose, Bld: 112 mg/dL — ABNORMAL HIGH (ref 70–99)

## 2011-04-11 LAB — CBC
HCT: 42.5 % (ref 39.0–52.0)
Hemoglobin: 13.9 g/dL (ref 13.0–17.0)
Hemoglobin: 14.4 g/dL (ref 13.0–17.0)
MCHC: 33.2 g/dL (ref 30.0–36.0)
RDW: 12.4 % (ref 11.5–15.5)
WBC: 5 10*3/uL (ref 4.0–10.5)
WBC: 7.4 10*3/uL (ref 4.0–10.5)

## 2011-04-11 LAB — CARDIAC PANEL(CRET KIN+CKTOT+MB+TROPI)
CK, MB: 2.2 ng/mL (ref 0.3–4.0)
CK, MB: 2.1 ng/mL (ref 0.3–4.0)
Total CK: 59 U/L (ref 7–232)
Troponin I: <0.3 ng/mL (ref <0.30)

## 2011-04-11 LAB — POCT I-STAT TROPONIN I: Troponin i, poc: 0.01 ng/mL (ref 0.00–0.08)

## 2011-04-11 LAB — DIFFERENTIAL
Lymphocytes Relative: 26 % (ref 12–46)
Lymphs Abs: 1.9 10*3/uL (ref 0.7–4.0)
Monocytes Absolute: 0.5 10*3/uL (ref 0.1–1.0)
Monocytes Relative: 7 % (ref 3–12)
Neutro Abs: 4.8 10*3/uL (ref 1.7–7.7)

## 2011-04-11 LAB — CREATININE, SERUM
Creatinine, Ser: 0.99 mg/dL (ref 0.50–1.35)
GFR calc non Af Amer: 81 mL/min — ABNORMAL LOW (ref >90)

## 2011-04-11 LAB — APTT: aPTT: 33 seconds (ref 24–37)

## 2011-04-11 MED ORDER — HEPARIN SODIUM (PORCINE) 5000 UNIT/ML IJ SOLN
5000.0000 [IU] | Freq: Three times a day (TID) | INTRAMUSCULAR | Status: DC
  Administered 2011-04-11: 5000 [IU] via SUBCUTANEOUS
  Filled 2011-04-11 (×5): qty 1

## 2011-04-11 MED ORDER — ASPIRIN EC 81 MG PO TBEC: 81.0000 mg | DELAYED_RELEASE_TABLET | Freq: Every day | ORAL | Status: DC

## 2011-04-11 MED ORDER — GABAPENTIN 100 MG PO CAPS
100.0000 mg | ORAL_CAPSULE | Freq: Four times a day (QID) | ORAL | Status: DC
  Administered 2011-04-11 (×2): 100 mg via ORAL
  Filled 2011-04-11 (×4): qty 1

## 2011-04-11 MED ORDER — DOCUSATE SODIUM 100 MG PO CAPS
100.0000 mg | ORAL_CAPSULE | Freq: Every day | ORAL | Status: DC
  Administered 2011-04-11: 100 mg via ORAL
  Filled 2011-04-11: qty 1

## 2011-04-11 MED ORDER — CARVEDILOL 12.5 MG PO TABS
12.5000 mg | ORAL_TABLET | Freq: Two times a day (BID) | ORAL | Status: DC
  Administered 2011-04-11: 12.5 mg via ORAL
  Filled 2011-04-11 (×3): qty 1

## 2011-04-11 MED ORDER — ALPRAZOLAM 0.5 MG PO TABS
1.0000 mg | ORAL_TABLET | Freq: Three times a day (TID) | ORAL | Status: DC | PRN
  Administered 2011-04-11: 0.5 mg via ORAL
  Filled 2011-04-11: qty 1

## 2011-04-11 MED ORDER — NITROGLYCERIN 0.4 MG SL SUBL: 0.4000 mg | SUBLINGUAL_TABLET | SUBLINGUAL | Status: DC | PRN

## 2011-04-11 MED ORDER — AMLODIPINE BESYLATE 5 MG PO TABS
5.0000 mg | ORAL_TABLET | Freq: Two times a day (BID) | ORAL | Status: DC
  Administered 2011-04-11: 5 mg via ORAL
  Filled 2011-04-11 (×2): qty 1

## 2011-04-11 MED ORDER — ACETAMINOPHEN 325 MG PO TABS: 650.0000 mg | ORAL_TABLET | ORAL | Status: DC | PRN

## 2011-04-11 MED ORDER — PANTOPRAZOLE SODIUM 40 MG PO TBEC
40.0000 mg | DELAYED_RELEASE_TABLET | Freq: Every day | ORAL | Status: DC
  Administered 2011-04-11: 40 mg via ORAL

## 2011-04-11 MED ORDER — ASPIRIN 300 MG RE SUPP
300.0000 mg | RECTAL | Status: AC
  Filled 2011-04-11: qty 1

## 2011-04-11 MED ORDER — ONDANSETRON HCL 4 MG/2ML IJ SOLN: 4.0000 mg | Freq: Four times a day (QID) | INTRAMUSCULAR | Status: DC | PRN

## 2011-04-11 MED ORDER — SIMVASTATIN 20 MG PO TABS
20.0000 mg | ORAL_TABLET | Freq: Every day | ORAL | Status: DC
  Filled 2011-04-11: qty 1

## 2011-04-11 MED ORDER — ASPIRIN 81 MG PO CHEW
324.0000 mg | CHEWABLE_TABLET | ORAL | Status: AC
  Administered 2011-04-11: 324 mg via ORAL
  Filled 2011-04-11: qty 4

## 2011-04-11 MED ORDER — ISOSORBIDE MONONITRATE ER 60 MG PO TB24
60.0000 mg | ORAL_TABLET | Freq: Every day | ORAL | Status: DC
  Administered 2011-04-11: 60 mg via ORAL
  Filled 2011-04-11: qty 1

## 2011-04-11 NOTE — H&P (Signed)
See dicctated note #[id number]

## 2011-04-11 NOTE — Progress Notes (Signed)
i discharged Mr. [name]. He received information on current medications he already takes, as well as a list of community resources. He received information on follow-up appointments. I removed his iv with catheter intact, and discharge was successful. He explained that he did not have any questions.

## 2011-04-11 NOTE — Progress Notes (Signed)
Subjective:  The patient has had no further chest pain.Rhythm is stable NSR with LBBB. Cardiac enzymes are negative  X 1.  Second set is pending.  Objective:  Vital Signs in the last 24 hours: Temp:  [97.6 F (36.4 C)-97.8 F (36.6 C)] 97.8 F (36.6 C) (12/10 0610) Pulse Rate:  [59-75] 59  (12/10 0610) Resp:  [18] 18  (12/10 0610) BP: (107-134)/(63-92) 134/68 mmHg (12/10 0610) SpO2:  [95 %-97 %] 97 % (12/10 0610) Weight:  [193 lb 1.6 oz (87.59 kg)] 193 lb 1.6 oz (87.59 kg) (12/10 0610)  Intake/Output from previous day:   Intake/Output from this shift:       . amLODipine  5 mg Oral BID  . aspirin  324 mg Oral NOW   Or  . aspirin  300 mg Rectal NOW  . aspirin EC  81 mg Oral Daily  . carvedilol  12.5 mg Oral BID WC  . docusate sodium  100 mg Oral Daily  . gabapentin  100 mg Oral QID  . heparin  5,000 Units Subcutaneous Q8H  . isosorbide mononitrate  60 mg Oral Daily  . nitroGLYCERIN  1 inch Topical Once  . pantoprazole  40 mg Oral Q1200  . simvastatin  20 mg Oral q1800      Physical Exam: The patient appears to be in no distress.  Head and neck exam reveals that the pupils are equal and reactive.  The extraocular movements are full.  There is no scleral icterus.  Mouth and pharynx are benign.  No lymphadenopathy.  No carotid bruits.  The jugular venous pressure is normal.  Thyroid is not enlarged or tender.  Chest is clear to percussion and auscultation.  No rales or rhonchi.  Expansion of the chest is symmetrical.  Heart reveals no abnormal lift or heave.  First and second heart sounds are normal.  There is no murmur gallop rub or click.  The abdomen is soft and nontender.  Bowel sounds are normoactive.  There is no hepatosplenomegaly or mass.  There are no abdominal bruits.  Extremities reveal no phlebitis or edema.  Pedal pulses are good.  There is no cyanosis or clubbing.  Neurologic exam is normal strength and no lateralizing weakness.  No sensory  deficits.  Integument reveals no rash  Lab Results:  [facility] 04/10/11 2343  WBC 7.4  HGB 14.4  PLT 170    Basename 04/10/11 2343  NA 136  K 3.6  CL 102  CO2 27  GLUCOSE 112*  BUN 11  CREATININE 1.00   No results found for this basename: TROPONINI:2,CK,MB:2 in the last 72 hours Hepatic Function Panel No results found for this basename: PROT,ALBUMIN,AST,ALT,ALKPHOS,BILITOT,BILIDIR,IBILI in the last 72 hours No results found for this basename: CHOL in the last 72 hours No results found for this basename: PROTIME in the last 72 hours  Imaging: Imaging results have been reviewed.  Xray negative.  Cardiac Studies:  Assessment/Plan:  Patient Active Problem List  Diagnoses  . Ischemic cardiomyopathy  . Hypertension  . Hyperlipidemia  . GERD (gastroesophageal reflux disease)  . Anxiety  . Fibromyalgia  . Hx of CABG  . Cough, persistent  . Paroxysmal atrial flutter  . Unstable angina       Impression: Musculoskeletal chest wall pain.  Plan: Await second set of enzymes and if remain negative discharge home later today.    LOS: 1 day    Cassell Clement 04/11/2011, 7:55 AM

## 2011-04-11 NOTE — H&P (Signed)
NAME:  [name], [name] NO.:  [id number]  MEDICAL RECORD NO.:  [id number]  LOCATION:  [id number]                        FACILITY:  [facility]  PHYSICIAN:  Natasha Bence, MD       DATE OF BIRTH:  1940/05/31  DATE OF ADMISSION:  04/10/2011 DATE OF DISCHARGE:                             HISTORY & PHYSICAL   PRIMARY CARDIOLOGIST:  Cassell Clement, MD  CHIEF COMPLAINT:  Chest discomfort.  HISTORY OF PRESENT ILLNESS:  Mr. Molyneux is a 70 year old male with known ischemic cardiomyopathy and history of heart CABG and then a redo CABG in 2007, who presents this evening with chest discomfort.  He reports his chest discomfort started after scrubbing a toilet seat for approximately 1/2 an hour.  He says the chest pain was located substernally and lasted approximately 2 hours in duration.  This was similar to his previous episodes of angina, although somewhat less severe.  It was accompanied by mild shortness of breath and some mild diaphoresis.  He also says he was a little bit anxious at that time and unsure if this played a role.  Of note, he was on his treadmill for close to 30 minutes earlier in the morning without any chest discomfort. He underwent heart catheterization last month for similar symptoms and was found to have patent grafts at that point in time.  He has not had any further episodes of chest discomfort over the last month.  He is fairly active but denies any chest pain with exertion.  When EMS arrived in to his house he was given nitroglycerin spray with some improvement and then in the ED, he was given nitro patch which resolved the pain completely.  Currently, he is pain-free without complaints.  Otherwise, he denies any palpitations, lightheadedness, or dizziness.  No shortness of breath.  He has not had any dyspnea on exertion, PND, or orthopnea. He has no history of syncope.  REVIEW OF SYSTEMS:  Otherwise, he has no nausea, vomiting, or diarrhea. No fevers,  chills, or sweats.  He denies any arthralgia.  No bleeding or melena.  SKIN:  No rashes or ulcers.  MUSCULOSKELETAL:  No joint pain. GU:  No dysuria or hematuria.  RESPIRATORY:  See above.  CARDIOVASCULAR: See above.  GI:  See above.  Rest review of systems is otherwise negative.  PAST MEDICAL HISTORY: 1. Coronary artery disease, had a 4-vessel bypass in 1991 and a redo     CABG in 2007 with an SVG to an OM and sequential SVG to the PDA and     PLV and underwent cath recently showing patent grafts.  EF was 45%. 2. Hypertension. 3. Dyslipidemia. 4. GERD. 5. Fibromyalgia. 6. Anxiety. 7. History of paroxysmal atrial flutter. 8. 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  FAMILY HISTORY:  Mom with stroke, dad with coronary artery disease.  SOCIAL HISTORY:  He quit smoking over 30 years ago.  Denies alcohol or illicit drug use.  MEDICATIONS:  He is on: 1. Xanax 1 mg t.i.d. as needed. 2. Norvasc 5 mg p.o. b.i.d. 3. Aspirin 81 mg p.o. daily. 4. Atorvastatin 40 mg p.o. daily. 5.  Vitamin D with calcium carbonate daily. 6. Coreg 12.5 mg p.o. b.i.d. 7. Colace p.r.n. 8. Allegra 60 mg p.r.n. 9. Neurontin 100 mg 4 times daily. 10.Imdur 60 mg p.o. daily. 11.Multivitamin daily. 12.Omeprazole 20 mg p.o. daily.  ALLERGIES:  He is allergic to SULFA drugs.  PHYSICAL EXAMINATION:  VITAL SIGNS:  He is afebrile, pulse is 67 and regular, blood pressure 124/92, respiratory rate is 18, and O2 saturation is 97% on room air. GENERAL:  He is a well-developed white male, in no apparent distress. HEENT:  Eyes, anicteric sclerae.  Mucous membranes are moist. NECK:  Normal jugular venous pressure.  No carotid bruits. LUNGS:  Clear to auscultation bilaterally. CARDIOVASCULAR:  He has a regular rate and rhythm.  No murmurs, rubs, or gallops. ABDOMEN:  Soft, nontender, and nondistended. EXTREMITIES:  Warm with no edema.  He has symmetrical pulses  throughout. SKIN:  No rashes or ulcers. NEUROLOGIC:  Grossly afocal.  LABORATORY DATA:  Sodium 136, potassium 3.6, chloride 102, bicarb 27, BUN 11, creatinine of 1, calcium 9.0, and glucose 112.  Troponin is less than 0.3.  CK-MB is 2.1.  White blood cell count was 7.4, hematocrit was 43, platelet count 170.  He had a normal differential on his white blood cell count.  PT was 13.6, INR was 1.02, and PTT was 33.  Chest x-ray shows no infiltrates or effusions.  EKG shows, has a lot of artifacts but appears to be a paced rhythm with ventricular rate of 65 beats per minute.  IMPRESSION AND PLAN:  This is a 70 year old male with a known coronary artery disease who presents with chest discomfort.  Although the patient gives a history that is consistent with angina similar to his previous episodes, it is somewhat atypical in nature.  Also given that he exercised for nearly 30 minutes on his treadmill without chest discomfort, it is a lot less likely that this is related to coronary ischemia given his pain with fairly low threshold exertion while scrubbing the toilet seat.  He does seem somewhat anxious.  This may be contributory.  Given his history, we will admit him for observation with serial enzymes and monitor him on telemetry.  I do not feel he warrants full-dose anticoagulation.  We will discontinue his aspirin, statin, and other cardiac meds as they are from now.  We will continue him on heparin prophylactically.  He will likely be able to be discharged home tomorrow if everything is negative.  As he has not had a lot of episodes of chest discomfort, we may or may not want add long-term nitrates depending on what his symptomatology does over the course of the day.          ______________________________ Natasha Bence, MD     MH/MEDQ  D:  04/11/2011  T:  04/11/2011  Job:  [id number]

## 2011-04-11 NOTE — Discharge Summary (Signed)
Discharge Summary   Patient ID: [name] MRN: [id number], DOB/AGE: 10/29/1940 70 y.o. Admit date: 04/10/2011 D/C date:     04/11/2011   Primary Discharge Diagnoses:  1. Chest pain, felt to be musculoskeletal chest wall pain  Secondary Discharge Diagnoses:  1. CAD  - s/p CABG x 4 in 1991 - s/p redo CABG 2007 with SVG to an OM and sequential SVG to the PDA and PLV - recent cathetherization 01/2011 showing patent grafts   2. ICM EF 40% by heart cath 01/2011  3. Hypertension.  4. Dyslipidemia.  5. GERD.  6. Fibromyalgia.  7. Anxiety.  8. History of paroxysmal atrial flutter.  9. 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 He reported his CP had started after scrubbing a toilet seat for approximately 1/2 an hour, and lasted about 2 hours, less severe but similar to his prior episodes of angina. It was associated with mild diaphoresis/SOB, but the patient also reported anxiety at the time. He was able to exercise earlier day of admission on a treadmill x 30 minutes without symptoms. By time of evaluation in ER by cardiology, he had received NTG with some improvement .EKG showed plentiful artifact but appeared to be a paced rhythm with ventricular rate of 65 beats per minute. Initial POC troponin was negative. His chest pain was felt atypical in nature, especially in light of successful exercise earlier in the day. He was continued on his cardiac meds as well as heparin prophylactically. He had no further CP overnight.  A second set of cardiac enzymes was obtained, which remained negative. Dr. Patty Sermons felt his CP was most likely musculoskeletal chest wall pain; he has seen & examined the patient today and feels he is stable for discharge. Of note, he is not currently on an  ACEI but it is unclear why -- I discussed this with Dr. Patty Sermons and we will consider addition of this as an outpatient.  Discharge Vitals: Blood pressure 144/84, pulse 64, temperature 97.8 F (36.6 C), temperature source Oral, resp. rate 18, height 5\' 11"  (1.803 m), weight 193 lb 1.6 oz (87.59 kg), SpO2 97.00%.  Labs: Lab Results  Component Value Date   WBC 7.4 04/10/2011   HGB 14.4 04/10/2011   HCT 42.5 04/10/2011   MCV 90.0 04/10/2011   PLT 170 04/10/2011    Lab 04/10/11 2343  NA 136  K 3.6  CL 102  CO2 27  BUN 11  CREATININE 1.00  CALCIUM 9.0  PROT --  BILITOT --  ALKPHOS --  ALT --  AST --  GLUCOSE 112*   No results found for this basename: CKTOTAL:4,CKMB:4,TROPONINI:4 in the last 72 hours Lab Results  Component Value Date   CHOL 142 02/28/2011   HDL 39.90 02/28/2011   LDLCALC 76 02/28/2011   TRIG 133.0 02/28/2011     Diagnostic Studies/Procedures:  1. Chest 2 View  04/11/2011  *RADIOLOGY REPORT*  Clinical Data: Chest pain.  Difficulty breathing.  CHEST - 2 VIEW  Comparison: 02/10/2011  Findings: Stable appearance of cardiac pacemaker and postoperative changes since the previous study.  Normal heart size and pulmonary vascularity.  Old right rib fracture.  No focal airspace consolidation in the lungs.  No blunting of costophrenic angles. No pneumothorax.  No significant  changes since the previous study.  IMPRESSION: No evidence of active pulmonary disease.   Discharge Medications   Current Discharge Medication List    CONTINUE these medications which have NOT CHANGED   Details  acetaminophen (TYLENOL) 325 MG tablet Take 650 mg by mouth every 6 (six) hours as needed.      ALPRAZolam (XANAX) 1 MG tablet Take 1 tablet (1 mg total) by mouth 3 (three) times daily as needed for sleep. Qty: 90 tablet, Refills: 5   Associated Diagnoses: Anxiety    amLODipine (NORVASC) 5 MG tablet Take 1 tablet (5 mg total) by mouth 2 (two) times daily. Qty: 60 tablet, Refills: 11    Associated Diagnoses: Benign hypertensive heart disease without heart failure; Preop cardiovascular exam; Chest pain; Ischemic cardiomyopathy; Hypertension; Hyperlipidemia; Anxiety    aspirin 81 MG EC tablet Take 81 mg by mouth daily.      atorvastatin (LIPITOR) 40 MG tablet Take 1 tablet (40 mg total) by mouth daily. Qty: 90 tablet, Refills: 3   Associated Diagnoses: Pure hypercholesterolemia    Calcium Carbonate-Vitamin D (CALCIUM + D PO) Take by mouth daily.     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      Docusate Sodium (COLACE PO) Take by mouth as needed.      fexofenadine (ALLEGRA) 60 MG tablet Take 60 mg by mouth as needed.      gabapentin (NEURONTIN) 100 MG capsule Take 100 mg by mouth 4 (four) times daily.     isosorbide mononitrate (IMDUR) 60 MG 24 hr tablet Take 60 mg by mouth daily.      Multiple Vitamin (MULTIVITAMIN) tablet Take 1 tablet by mouth daily.      omeprazole (PRILOSEC) 20 MG capsule Take 20 mg by mouth 2 (two) times daily.      nitroGLYCERIN (NITROSTAT) 0.4 MG SL tablet Place 1 tablet (0.4 mg total) under the tongue every 5 (five) minutes as needed. Qty: 100 tablet, Refills: prn   Associated Diagnoses: Hx of CABG        Disposition   The patient will be discharged in stable condition to home. Discharge Orders    Future Appointments: Provider: Department: Dept Phone: Center:   04/29/2011 2:30 PM Cassell Clement, MD [facility] [phone] None     Future Orders Please Complete By Expires   Diet - low sodium heart healthy      Increase activity slowly        Follow-up Information    Follow up with Cassell Clement, MD. (04/29/11 at 2:30pm)            Duration of Discharge Encounter: Greater than 30  minutes including physician and PA time.  Signed, Ronie Spies PA-C 04/11/2011, 9:32 AM

## 2011-04-27 ENCOUNTER — Telehealth: Payer: Self-pay | Admitting: Cardiology

## 2011-04-27 NOTE — Telephone Encounter (Signed)
New msg Pt wants some antibiotics He said he has cold/congestion aching all over please call him back

## 2011-04-27 NOTE — Telephone Encounter (Signed)
Pt complaining of sore throat, ear pain, headache and generalized body aches since Monday.  I recommended that he see his pcp or go to urgent care since this does not appear to be a cardiac problem.

## 2011-04-29 ENCOUNTER — Ambulatory Visit (INDEPENDENT_AMBULATORY_CARE_PROVIDER_SITE_OTHER): Payer: Medicare Other | Admitting: Cardiology

## 2011-04-29 ENCOUNTER — Encounter: Payer: Self-pay | Admitting: Cardiology

## 2011-04-29 VITALS — BP 130/74 | HR 66 | Ht 71.0 in | Wt 196.4 lb

## 2011-04-29 DIAGNOSIS — Z951 Presence of aortocoronary bypass graft: Secondary | ICD-10-CM

## 2011-04-29 DIAGNOSIS — I119 Hypertensive heart disease without heart failure: Secondary | ICD-10-CM

## 2011-04-29 DIAGNOSIS — I255 Ischemic cardiomyopathy: Secondary | ICD-10-CM

## 2011-04-29 DIAGNOSIS — Z01810 Encounter for preprocedural cardiovascular examination: Secondary | ICD-10-CM

## 2011-04-29 DIAGNOSIS — I2510 Atherosclerotic heart disease of native coronary artery without angina pectoris: Secondary | ICD-10-CM

## 2011-04-29 DIAGNOSIS — I10 Essential (primary) hypertension: Secondary | ICD-10-CM

## 2011-04-29 DIAGNOSIS — F419 Anxiety disorder, unspecified: Secondary | ICD-10-CM

## 2011-04-29 DIAGNOSIS — R079 Chest pain, unspecified: Secondary | ICD-10-CM

## 2011-04-29 DIAGNOSIS — E7800 Pure hypercholesterolemia, unspecified: Secondary | ICD-10-CM

## 2011-04-29 DIAGNOSIS — E785 Hyperlipidemia, unspecified: Secondary | ICD-10-CM

## 2011-04-29 DIAGNOSIS — I2589 Other forms of chronic ischemic heart disease: Secondary | ICD-10-CM

## 2011-04-29 MED ORDER — NITROGLYCERIN 0.4 MG SL SUBL: 0.4000 mg | SUBLINGUAL_TABLET | SUBLINGUAL | Status: DC | PRN

## 2011-04-29 MED ORDER — AMLODIPINE BESYLATE 5 MG PO TABS: 5.0000 mg | ORAL_TABLET | Freq: Two times a day (BID) | ORAL | Status: DC

## 2011-04-29 MED ORDER — CARVEDILOL 12.5 MG PO TABS: 12.5000 mg | ORAL_TABLET | Freq: Two times a day (BID) | ORAL | Status: DC

## 2011-04-29 MED ORDER — ATORVASTATIN CALCIUM 80 MG PO TABS: ORAL_TABLET | ORAL | Status: DC

## 2011-04-29 NOTE — Assessment & Plan Note (Signed)
Blood pressure has been remaining stable on current medication.  He does have some element of whitecoat syndrome.

## 2011-04-29 NOTE — Patient Instructions (Signed)
Your physician recommends that you continue on your current medications as directed. Please refer to the Current Medication list given to you today.  Your physician recommends that you schedule a follow-up appointment in: 3 months  

## 2011-04-29 NOTE — Assessment & Plan Note (Signed)
The patient continues to try to watch his diet.  When he returns in 3 months we will recheck his lipids.

## 2011-04-29 NOTE — Assessment & Plan Note (Signed)
The patient has been seeing a counselor and has been able to cut the dose of his Xanax in half.  He appears to be less anxious.  He continues to avoid caffeine.

## 2011-04-29 NOTE — Progress Notes (Signed)
Linton Ham Date of Birth:  [birth year] [facility] / [facility] [address]   [address] [place], [place]  [address] [phone]           Fax   [phone]  History of Present Illness: This pleasant 70 year old gentleman is seen for a scheduled followup office visit.  He has a past history of atrial flutter and had an ablation.  He had subsequently heart block and has a dual-chamber pacemaker which is working well.  He has been feeling well since last visit with no prolonged chest pain.  He has his house on the market and as soon as it settles he will be moving to a senior citizens apartment complex closer to [place] which will be convenient for him.  He is looking forward to the move.  Current Outpatient Prescriptions  Medication Sig Dispense Refill  . acetaminophen (TYLENOL) 325 MG tablet Take 650 mg by mouth every 6 (six) hours as needed.        . ALPRAZolam (XANAX) 1 MG tablet Take 1 tablet (1 mg total) by mouth 3 (three) times daily as needed for sleep.  90 tablet  5  . amLODipine (NORVASC) 5 MG tablet Take 1 tablet (5 mg total) by mouth 2 (two) times daily.  180 tablet  3  . amoxicillin (AMOXIL) 500 MG capsule Take 500 mg by mouth 3 (three) times daily.        [name] Kitchen aspirin 81 MG EC tablet Take 81 mg by mouth daily.        [name] Kitchen atorvastatin (LIPITOR) 80 MG tablet 1/2 tablet daily  45 tablet  3  . Calcium Carbonate-Vitamin D (CALCIUM + D PO) Take by mouth daily.        . carvedilol (COREG) 12.5 MG tablet Take 1 tablet (12.5 mg total) by mouth 2 (two) times daily with a meal.  180 tablet  11  . Docusate Sodium (COLACE PO) Take by mouth as needed.        . doxepin (SINEQUAN) 25 MG capsule Take 25 mg by mouth 3 (three) times daily.        . fexofenadine (ALLEGRA) 60 MG tablet Take 60 mg by mouth as needed.        . gabapentin (NEURONTIN) 100 MG capsule Take 100 mg by mouth 4 (four) times daily.       [name] Kitchen guaiFENesin (MUCINEX) 600 MG 12 hr tablet Take 1,200 mg by mouth 2 (two)  times daily.        . isosorbide mononitrate (IMDUR) 60 MG 24 hr tablet Take 60 mg by mouth daily.        . Multiple Vitamin (MULTIVITAMIN) tablet Take 1 tablet by mouth daily.        . nitroGLYCERIN (NITROSTAT) 0.4 MG SL tablet Place 1 tablet (0.4 mg total) under the tongue every 5 (five) minutes as needed.  100 tablet  prn  . omeprazole (PRILOSEC) 20 MG capsule Take 20 mg by mouth 2 (two) times daily.        . sucralfate (CARAFATE) 1 G tablet Take 1 g by mouth 3 (three) times daily.        [name] Kitchen DISCONTD: amLODipine (NORVASC) 5 MG tablet Take 1 tablet (5 mg total) by mouth 2 (two) times daily.  60 tablet  11  . DISCONTD: nitroGLYCERIN (NITROSTAT) 0.4 MG SL tablet Place 1 tablet (0.4 mg total) under the tongue every 5 (five) minutes as needed.  100 tablet  prn  Allergies  Allergen Reactions  . Sulfa Drugs Cross Reactors Other (See Comments)    unknown  . Ultram (Tramadol Hcl) Itching    Patient Active Problem List  Diagnoses  . Ischemic cardiomyopathy  . Hypertension  . Hyperlipidemia  . GERD (gastroesophageal reflux disease)  . Anxiety  . Fibromyalgia  . Hx of CABG  . Cough, persistent  . Paroxysmal atrial flutter  . Chest pain    History  Smoking status  . Former Smoker  . Quit date: 09/21/1973  Smokeless tobacco  . Never Used    History  Alcohol Use No    Family History  Problem Relation Age of Onset  . Stroke Mother   . Heart attack Father     Review of Systems: Constitutional: no fever chills diaphoresis or fatigue or change in weight.  Head and neck: no hearing loss, no epistaxis, no photophobia or visual disturbance. Respiratory: No cough, shortness of breath or wheezing. Cardiovascular: No chest pain peripheral edema, palpitations. Gastrointestinal: No abdominal distention, no abdominal pain, no change in bowel habits hematochezia or melena. Genitourinary: No dysuria, no frequency, no urgency, no nocturia. Musculoskeletal:No arthralgias, no back pain, no  gait disturbance or myalgias. Neurological: No dizziness, no headaches, no numbness, no seizures, no syncope, no weakness, no tremors. Hematologic: No lymphadenopathy, no easy bruising. Psychiatric: No confusion, no hallucinations, no sleep disturbance.    Physical Exam: Filed Vitals:   04/29/11 1336  BP: 130/74  Pulse: 66   t he general appearance reveals a well-developed well-nourished elderly gentleman in no distress.The head and neck exam reveals pupils equal and reactive.  Extraocular movements are full.  There is no scleral icterus.  The mouth and pharynx are normal.  The neck is supple.  The carotids reveal no bruits.  The jugular venous pressure is normal.  The  thyroid is not enlarged.  There is no lymphadenopathy.  The chest is clear to percussion and auscultation.  There are no rales or rhonchi.  Expansion of the chest is symmetrical.  The precordium is quiet.  The first heart sound is normal.  The second heart sound is physiologically split.  There is no murmur gallop rub or click.  There is no abnormal lift or heave.  The abdomen is soft and nontender.  The bowel sounds are normal.  The liver and spleen are not enlarged.  There are no abdominal masses.  There are no abdominal bruits.  Extremities reveal good pedal pulses.  There is no phlebitis or edema.  There is no cyanosis or clubbing.  Strength is normal and symmetrical in all extremities.  There is no lateralizing weakness.  There are no sensory deficits.  The skin is warm and dry.  There is no rash.  EKG shows normal sinus rhythm with ventricular pacing   Assessment / Plan:  We filled out a form for a handicapped automobile placard today.  He is to continue same medication.  Recheck in 3 months for office visit and fasting lab work

## 2011-05-04 ENCOUNTER — Ambulatory Visit: Payer: Medicare Other | Admitting: Nurse Practitioner

## 2011-05-04 ENCOUNTER — Telehealth: Payer: Self-pay | Admitting: *Deleted

## 2011-05-04 NOTE — Telephone Encounter (Signed)
Started about 10 minutes after finishing treadmill.  Chewed 4 81 mg ASA and took 3 NTG and has dull ache only now. Very concerned about walking on treadmill.  Has been doing 20 minutes on treadmill for about a week and heart rate and blood pressure down in about 10 minutes.  Please advise

## 2011-05-04 NOTE — Telephone Encounter (Signed)
   On any further treadmill usage today or tomorrow.  Friday start back but use the treadmill for only 10 minutes at a time

## 2011-05-04 NOTE — Telephone Encounter (Signed)
C/o chest pain/ ache while exercising, tried 3 nito with a little relief, was just in to see dr Patty Sermons and had cath 2 months ago. Spoke with [facility] LPN for dr Patty Sermons and she will call pt back.

## 2011-05-04 NOTE — Telephone Encounter (Signed)
Advised patient.  Also had virus last week.

## 2011-05-17 NOTE — Telephone Encounter (Signed)
New Problem   Patient has concerns about care.  Please return call to patient at hm#

## 2011-05-17 NOTE — Telephone Encounter (Signed)
Left message to call back  

## 2011-05-20 ENCOUNTER — Other Ambulatory Visit: Payer: Self-pay

## 2011-05-20 ENCOUNTER — Encounter (HOSPITAL_COMMUNITY): Payer: Self-pay | Admitting: *Deleted

## 2011-05-20 ENCOUNTER — Emergency Department (HOSPITAL_COMMUNITY)
Admission: EM | Admit: 2011-05-20 | Discharge: 2011-05-20 | Disposition: A | Discharge status: Home / Self Care | Payer: Medicare Other | Attending: Emergency Medicine | Admitting: Emergency Medicine

## 2011-05-20 ENCOUNTER — Emergency Department (HOSPITAL_COMMUNITY): Payer: Medicare Other

## 2011-05-20 ENCOUNTER — Telehealth: Payer: Self-pay | Admitting: Cardiology

## 2011-05-20 DIAGNOSIS — R110 Nausea: Secondary | ICD-10-CM | POA: Insufficient documentation

## 2011-05-20 DIAGNOSIS — Z7982 Long term (current) use of aspirin: Secondary | ICD-10-CM | POA: Insufficient documentation

## 2011-05-20 DIAGNOSIS — I10 Essential (primary) hypertension: Secondary | ICD-10-CM | POA: Insufficient documentation

## 2011-05-20 DIAGNOSIS — R072 Precordial pain: Secondary | ICD-10-CM | POA: Insufficient documentation

## 2011-05-20 DIAGNOSIS — IMO0001 Reserved for inherently not codable concepts without codable children: Secondary | ICD-10-CM | POA: Insufficient documentation

## 2011-05-20 DIAGNOSIS — K219 Gastro-esophageal reflux disease without esophagitis: Secondary | ICD-10-CM | POA: Insufficient documentation

## 2011-05-20 DIAGNOSIS — F341 Dysthymic disorder: Secondary | ICD-10-CM | POA: Insufficient documentation

## 2011-05-20 DIAGNOSIS — Z9889 Other specified postprocedural states: Secondary | ICD-10-CM | POA: Insufficient documentation

## 2011-05-20 DIAGNOSIS — R079 Chest pain, unspecified: Secondary | ICD-10-CM

## 2011-05-20 DIAGNOSIS — E785 Hyperlipidemia, unspecified: Secondary | ICD-10-CM | POA: Insufficient documentation

## 2011-05-20 DIAGNOSIS — Z79899 Other long term (current) drug therapy: Secondary | ICD-10-CM | POA: Insufficient documentation

## 2011-05-20 LAB — COMPREHENSIVE METABOLIC PANEL
ALT: 16 U/L (ref 0–53)
AST: 18 U/L (ref 0–37)
Albumin: 4 g/dL (ref 3.5–5.2)
Alkaline Phosphatase: 70 U/L (ref 39–117)
Potassium: 4.3 mEq/L (ref 3.5–5.1)
Sodium: 132 mEq/L — ABNORMAL LOW (ref 135–145)
Total Protein: 6.8 g/dL (ref 6.0–8.3)

## 2011-05-20 LAB — DIFFERENTIAL
Basophils Absolute: 0 10*3/uL (ref 0.0–0.1)
Basophils Relative: 0 % (ref 0–1)
Eosinophils Absolute: 0 10*3/uL (ref 0.0–0.7)
Neutrophils Relative %: 88 % — ABNORMAL HIGH (ref 43–77)

## 2011-05-20 LAB — TROPONIN I: Troponin I: <0.3 ng/mL (ref <0.30)

## 2011-05-20 LAB — CBC
MCH: 30.6 pg (ref 26.0–34.0)
MCHC: 34.2 g/dL (ref 30.0–36.0)
Platelets: 173 10*3/uL (ref 150–400)

## 2011-05-20 LAB — POCT I-STAT TROPONIN I: Troponin i, poc: 0 ng/mL (ref 0.00–0.08)

## 2011-05-20 LAB — CK TOTAL AND CKMB (NOT AT ARMC): Total CK: 73 U/L (ref 7–232)

## 2011-05-20 MED ORDER — HEPARIN BOLUS VIA INFUSION
4000.0000 [IU] | Freq: Once | INTRAVENOUS | Status: DC
  Filled 2011-05-20: qty 4000

## 2011-05-20 MED ORDER — ONDANSETRON HCL 4 MG/2ML IJ SOLN
4.0000 mg | Freq: Once | INTRAMUSCULAR | Status: AC
  Administered 2011-05-20: 4 mg via INTRAVENOUS
  Filled 2011-05-20: qty 2

## 2011-05-20 MED ORDER — MORPHINE SULFATE 4 MG/ML IJ SOLN
4.0000 mg | Freq: Once | INTRAMUSCULAR | Status: AC
  Administered 2011-05-20: 4 mg via INTRAVENOUS
  Filled 2011-05-20: qty 1

## 2011-05-20 MED ORDER — HEPARIN SOD (PORCINE) IN D5W 100 UNIT/ML IV SOLN
1000.0000 [IU]/h | INTRAVENOUS | Status: DC
  Filled 2011-05-20: qty 250

## 2011-05-20 NOTE — Telephone Encounter (Signed)
Patient never called back.

## 2011-05-20 NOTE — Telephone Encounter (Signed)
New problem    Patient would like a return call concern, chest pain from shoveling snow.  No dizziness but shortness of breath.  Encouraged him to seek Emergency care if necessary

## 2011-05-20 NOTE — ED Notes (Signed)
Patient is AOx4 and comfortable being discharged.  He is comfortable with the discharge instructions as well and the signs and symptoms to watch for that would necessitate coming back to the ER.

## 2011-05-20 NOTE — ED Provider Notes (Addendum)
71 year old male status post coronary artery bypass x2 chest pain after clearing snow off of his back. This is at about 4 PM. Since then, the pain has waxed and waned but has not gone away. He states the pain is similar to what he had a before his bypass surger. He had a catheterization in August and was told that his vessels were clean.   Dione Booze, MD 05/20/11 1957   I have reviewed his heart catheterization report from October and it shows patent grafts but there are still of his circumflex refill via collaterals. I feel that he does have myocardium at risk.  Dione Booze, MD 05/20/11 2002

## 2011-05-20 NOTE — H&P (Signed)
See dictated note.  D/c home from ED with atypical chest pain

## 2011-05-20 NOTE — ED Notes (Signed)
Patient transported to X-ray 

## 2011-05-20 NOTE — ED Notes (Signed)
Family at bedside. 

## 2011-05-20 NOTE — ED Provider Notes (Signed)
History     CSN: [id number]  Arrival date & time 05/20/11  1839   First MD Initiated Contact with Patient 05/20/11 1922      Chief Complaint  Patient presents with  . Chest Pain    (Consider location/radiation/quality/duration/timing/severity/associated sxs/prior treatment) HPI  71 year old male with history of CAD, ischemic cardiomyopathy, and history of CABG is present to the ED with chief complaints of chest pain. Patient states he was out in his backyard this afternoon to clean off the snow from the railing. States after spending a few minutes pushing a small amount of snow off of his back porch when he returns back to his room he notice pain in his midsternal area. Pain is described as dull, pressure sensation and nonradiating. Increasing mild nausea without vomiting or diarrhea. Patient denies headache, fever, cough, shortness of breath, abdominal pain. Due to the fact that he has a significant cardiac history, the patient contacted EMS. He did took an aspirin and sublingual nitroglycerin which provide some improvement. He does continue to experience and mild tenderness to midsternal region. Patient mentioned that he had a cardiac catheterization procedure in October with normal result.  Pt has Visual merchandiser.    Past Medical History  Diagnosis Date  . Ischemic cardiomyopathy     EF 40% by heart cath 01/2011   . Hypertension   . Hyperlipidemia   . GERD (gastroesophageal reflux disease)   . Anxiety   . CAD (coronary artery disease)     s/p CABGx4 in 1991, with redo surgery in 2007 with  SVG to an OM and sequential SVG to the PDA and PLV, recent cath 01/2011 with patent grafts  . Atrial flutter October 2012    S/P ablation - not successful  . CHB (complete heart block) October 2012    S/P PTVP following ablation  . Kidney cysts     CYST ON BOTH KIDNEYS  . Hiatal hernia   . Fibromyalgia   . Depression     Past Surgical History  Procedure Date  . Cardiac catheterization  10/30/2009    Grafts patent. Mild to moderate LV dysfunction. EF 45%. Managed medically.   . Coronary artery bypass graft     Originial surgery in 1991 with LIMA to LAD, SVG to OM/LCX, SVG to PD & PL; Redo surgery in 2007 with SVG to OM and SVG to PD & PL  . Hernia repair   . Tonsillectomy   . Eye surgery     BOTH  CATERACTS  REMOVED WITH REPLACEMENT LENSES  . Insert / replace / remove pacemaker   . Cholecystectomy   . Coronary artery bypass graft     Family History  Problem Relation Age of Onset  . Stroke Mother   . Heart attack Father     History  Substance Use Topics  . Smoking status: Former Smoker    Quit date: 09/21/1973  . Smokeless tobacco: Never Used  . Alcohol Use: No      Review of Systems  All other systems reviewed and are negative.    Allergies  Sulfa drugs cross reactors and Ultram  Home Medications   Current Outpatient Rx  Name Route Sig Dispense Refill  . ALPRAZOLAM 1 MG PO TABS Oral Take 1 mg by mouth 2 (two) times daily.    [name] Kitchen AMLODIPINE BESYLATE 5 MG PO TABS Oral Take 1 tablet (5 mg total) by mouth 2 (two) times daily. 180 tablet 3  . ASPIRIN 81 MG PO TBEC Oral  Take 81 mg by mouth daily.      . ATORVASTATIN CALCIUM 80 MG PO TABS Oral Take 40 mg by mouth daily. 1/2 tablet daily    . CALCIUM + D PO Oral Take 1 tablet by mouth daily.     [name] Kitchen CARVEDILOL 12.5 MG PO TABS Oral Take 12.5 mg by mouth 2 (two) times daily with a meal.    . COLACE PO Oral Take 1 capsule by mouth daily.     [name] Kitchen DOXEPIN HCL 25 MG PO CAPS Oral Take 25 mg by mouth 3 (three) times daily.      [name] Kitchen FEXOFENADINE HCL 60 MG PO TABS Oral Take 60 mg by mouth as needed. For allergy symptoms    . GABAPENTIN 100 MG PO CAPS Oral Take 100 mg by mouth 4 (four) times daily.     . ISOSORBIDE MONONITRATE ER 60 MG PO TB24 Oral Take 60 mg by mouth daily.      . METHYLPREDNISOLONE 4 MG PO KIT Oral Take by mouth See admin instructions. Take 6 tabs on day 1 then decrease by 1 tab daily (6,5,4,3,2,1)    .  ONE-DAILY MULTI VITAMINS PO TABS Oral Take 1 tablet by mouth daily.      [name] Kitchen NITROGLYCERIN 0.4 MG SL SUBL Sublingual Place 0.4 mg under the tongue every 5 (five) minutes as needed. For chest pain    . OMEPRAZOLE 20 MG PO CPDR Oral Take 20 mg by mouth 2 (two) times daily.      . SUCRALFATE 1 G PO TABS Oral Take 1 g by mouth 3 (three) times daily.        BP 138/76  Pulse 67  Temp(Src) 98.1 F (36.7 C) (Oral)  Resp 16  SpO2 98%  Physical Exam  Nursing note and vitals reviewed. Constitutional: He is oriented to person, place, and time.       Awake, alert, nontoxic appearance  HENT:  Head: Atraumatic.  Eyes: Right eye exhibits no discharge. Left eye exhibits no discharge.  Neck: Neck supple.  Cardiovascular: Normal rate and regular rhythm.   Pulmonary/Chest: Effort normal. No respiratory distress. He has no wheezes. He has no rales. He exhibits no tenderness.  Abdominal: Soft. Bowel sounds are normal. There is no tenderness. There is no rebound.  Musculoskeletal: Normal range of motion. He exhibits no tenderness.       Baseline ROM, no obvious new focal weakness.  No lower extremity swelling or calf pain.  Pedal pulse 2+ bilaterally.  Neurological: He is alert and oriented to person, place, and time.       Mental status and motor strength appears baseline for patient and situation  Skin: No rash noted.  Psychiatric: He has a normal mood and affect.    ED Course  Procedures (including critical care time)   Labs Reviewed  CBC  DIFFERENTIAL  COMPREHENSIVE METABOLIC PANEL  CK TOTAL AND CKMB  I-STAT TROPONIN I   No results found.   No diagnosis found.   Date: 05/20/2011  Rate: 66  Rhythm: normal sinus rhythm  QRS Axis: left  Intervals: electronic ventricular pacemaker  ST/T Wave abnormalities: indeterminate  Conduction Disutrbances:electronic ventricular pace maker  Narrative Interpretation:   Old EKG Reviewed: unchanged    MDM  Cardiac work up initiated.  Discussed  with my attending.  Will give morphine for pain and 2 sets of cardiac markers.    Pt still is having chest discomfort.  Heparin initiated.  Will contact Dr. Patty Sermons for further  management.    8:59 PM Cardiology has been been consulted, and will see pt in ED.    11:13 PM Cardiology has seen and evaluate patient. The cardiologist recommend not starting heparin at this time. His recommendation is to repeat another set of cardiac enzymes. If the result was negative, then patient can be discharged. The second set of cardiac enzyme is negative. Patient is now in no acute distress. Patient is understanding of plan and agrees to follow up with his doctor at a later time. Patient will be discharge.    Fayrene Helper, PA-C 05/20/11 2233  Fayrene Helper, PA-C 05/20/11 2316

## 2011-05-20 NOTE — Progress Notes (Signed)
ANTICOAGULATION CONSULT NOTE - Initial Consult  Pharmacy Consult for Heparin Indication: chest pain/ACS  Allergies  Allergen Reactions  . Sulfa Drugs Cross Reactors Other (See Comments)    unknown  . Ultram (Tramadol Hcl) Itching    Patient Measurements: Height: 5' 10.87" (180 cm) Weight: 196 lb 6.9 oz (89.1 kg) IBW/kg (Calculated) : 74.99  Adjusted Body Weight: 89.1 kg  Vital Signs: Temp: 98.2 F (36.8 C) (01/18 1937) Temp src: Oral (01/18 1937) BP: 124/63 mmHg (01/18 1937) Pulse Rate: 63  (01/18 1937)  Labs:  Basename 05/20/11 1920  HGB 13.4  HCT 39.2  PLT 173  APTT --  LABPROT --  INR --  HEPARINUNFRC --  CREATININE --  CKTOTAL --  CKMB --  TROPONINI --   Estimated Creatinine Clearance: 73.7 ml/min (by C-G formula based on Cr of 0.99).  Medical History: Past Medical History  Diagnosis Date  . Ischemic cardiomyopathy     EF 40% by heart cath 01/2011   . Hypertension   . Hyperlipidemia   . GERD (gastroesophageal reflux disease)   . Anxiety   . CAD (coronary artery disease)     s/p CABGx4 in 1991, with redo surgery in 2007 with  SVG to an OM and sequential SVG to the PDA and PLV, recent cath 01/2011 with patent grafts  . Atrial flutter October 2012    S/P ablation - not successful  . CHB (complete heart block) October 2012    S/P PTVP following ablation  . Kidney cysts     CYST ON BOTH KIDNEYS  . Hiatal hernia   . Fibromyalgia   . Depression     Medications:   Assessment: 71 y/o male developed CP while shoveling snow. Patient has a known h/o CAD. Hgb and platelets WNL. Some CP noted in telephone encounter with NP 05/04/11.  Goal of Therapy:  Heparin level 0.3-0.7 units/ml   Plan:  Heparin 4000 unit IV bolus and infusion at 1000 units/hr. Check heparin level in 6 hrs and daily.  Misty Stanley Stillinger 05/20/2011,8:09 PM

## 2011-05-20 NOTE — ED Notes (Signed)
MD at bedside. 

## 2011-05-20 NOTE — ED Notes (Addendum)
IM MD (admitting) at [facility].

## 2011-05-20 NOTE — ED Notes (Signed)
The pt has had mid-chest pain since 1700 today with some nausea.   He  Had 324mg   Aspirin and 3 sl nitro  then

## 2011-05-20 NOTE — ED Provider Notes (Signed)
Medical screening examination/treatment/procedure(s) were conducted as a shared visit with non-physician practitioner(s) and myself.  I personally evaluated the patient during the encounter   Dione Booze, MD 05/20/11 2344

## 2011-05-20 NOTE — ED Notes (Signed)
Cardiologist advised me to cancel heparin bolus and drip.

## 2011-05-20 NOTE — Telephone Encounter (Signed)
Took short breaks between pushing snow and started having chest pain.  Took 3 NTG and still has dull ache.  Advised he needed to be checked out.  Is going to call 911

## 2011-05-21 NOTE — H&P (Signed)
NAME:  [name], [name] NO.:  [id number]  MEDICAL RECORD NO.:  [id number]  LOCATION:  [id number]                        FACILITY:  [facility]  PHYSICIAN:  Natasha Bence, MD       DATE OF BIRTH:  07-16-1940  DATE OF ADMISSION:  05/20/2011 DATE OF DISCHARGE:  05/20/2011                             HISTORY & PHYSICAL   CONSULTING PHYSICIAN:  Emergency Room.  REASON FOR CONSULTATION:  The patient with chest pain.  HISTORY OF PRESENT ILLNESS:  [name] is a 71 year old male with a history of ischemic cardiomyopathy with history of remote coronary artery bypass grafting and redo in 2007.  He presents tonight with chest discomfort.  He reports the chest discomfort is a dull twisting pain, located right at his xiphoid process, it is nonradiating.  It is not associated with any shortness of breath, lightheadedness, or dizziness. He has not had any nausea or diaphoresis associated with this.  The pain occurred while he was sitting in his chair.  Earlier, he had been doing some light work comes back deck for approximately 10 minutes with a broom, and came in to sit down, and that is when the experience the discomfort.  He said it hurts particularly when he pushes on the skin area, over his xiphoid process.  He reproduced this pain in the emergency department, said this was the exact pain that he has been having.  In the emergency room, he was given several nitroglycerin without relief.  He did get relief after receiving morphine.  Currently, the pain is still what he calls a dull ache and is still reproducible on palpation over his sternum.  REVIEW OF SYSTEMS:  Otherwise, complete review of systems was performed. He denies any fevers, chills, or sweats.  He has not had any cough.  He denies any dyspnea on exertion, PND, or orthopnea.  He has not had any palpitations other than his chest discomfort, he has not had any since is last admission and he maintains a fair activity on  his treadmill.  He denies any nausea, vomiting, diarrhea, or melena.  No dysuria or hematuria.  No skin rashes or ulcers.  No arthralgias.  Rest of review of systems is otherwise negative.  PAST MEDICAL HISTORY: 1. Coronary artery disease, status post 4-vessel bypass in 1991 and     redo bypass in 2007.  He underwent catheterizations in the last     several months showing patent grafts.  He has ischemic     cardiomyopathy with an EF of 45%. 2. Hypertension. 3. Dyslipidemia. 4. Anxiety. 5. Fibromyalgia. 6. GERD. 7. Paroxysmal atrial flutter. 8. History of pacemaker implantation.  FAMILY HISTORY:  Father had coronary artery disease.  Mother had a stroke.  SOCIAL HISTORY:  He is an ex-smoker, but quit 30 years ago.  Denies any alcohol or illicit drug use.  MEDICATIONS: 1. He is on Xanax 1 mg p.o. b.i.d. 2. He is on Norvasc 5 mg p.o. b.i.d. 3. Aspirin 81 mg p.o. daily. 4. Lipitor 20 mg p.o. daily. 5. Calcium carbonate with vitamin D 1 tab daily. 6. Coreg 12.5 mg p.o. b.i.d. 7. Colace daily. 8. Doxepin 25 mg t.i.d. 9. Allegra 60  mg p.r.n. 10.Neurontin 100 mg p.o. 4 times daily. 11.Imdur 60 mg p.o. daily. 12.Multivitamin daily. 13.Nitroglycerin p.r.n. 14.Prilosec 20 mg p.o. b.i.d. 15.Sucralfate 1 g p.o. t.i.d.  ALLERGIES:  He is allergic to sulfa drugs.  PHYSICAL EXAMINATION:  VITAL SIGNS:  He is afebrile.  Temperature 96.7, pulse is 67 and regular, respiratory rate of 15, blood pressure 143/84, O2 sats 97% on room air. GENERAL:  He is a well-developed white male, in no apparent distress. HEENT:  Eyes is anicteric sclerae.  Pupils equal, round, reactive to light.  Mucous membranes are moist. NECK:  No carotid bruits.  Normal jugular venous pressure. LUNGS:  Clear to auscultation bilaterally. CARDIOVASCULAR:  Regular rate and rhythm.  No murmurs, rubs, or gallops. ABDOMEN:  Soft, nontender, nondistended. CHEST:  He has minimal tenderness to palpation over his xiphoid  process. EXTREMITIES:  Warm with no edema.  He has symmetrical pulses throughout. NEUROLOGIC:  Grossly afocal. PSYCHIATRIC:  Somewhat anxious appearing.  LABORATORY DATA:  Sodium 136, potassium 3.6, chloride of 102, bicarb of 27, BUN of 11, creatinine of 1, calcium of 9, glucose of 112.  White blood cell count 8.9, hematocrit of 39.2, platelet count of 173. Troponin 1st 1 was 0.00, and repeat was less than 0.3.  CK-MB was normal at 2.5 and CK was 73.  Chest x-ray showed no acute infiltrates or effusions.  EKG shows ventricularly paced rhythm.  IMPRESSION AND PLAN:  This is a 71 year old male with known ischemic heart disease, who presents with atypical chest pain.  He reports he does not think it is his heart, but was anxious and wanted to come get checked out.  His pain is completely reproducible on exam.  He has some mild tenderness to palpation over his sternum.  The pain was not relieved with nitroglycerin.  It is not exertional in nature.  He is actually fairly active and does routine treadmill exercising without chest discomfort recently.  He has not had any shortness of breath or any other symptoms to suggest ischemia.  He discussed possibilities of admitting him for observation versus reassurance, that this was a low risk for anginal chest pain, although he is somewhat anxious.  He does feel that this chest pain was noncardiac in etiology.  He decided he would be better off going home and I agree that this to be the appropriate course given his history.  We did review another set of cardiac enzymes in the ED, which was also negative.  He is to continue his current regimen as he is to give the office a call or come back to the hospital with any recurrent chest discomfort.          ______________________________ Natasha Bence, MD     MH/MEDQ  D:  05/20/2011  T:  05/21/2011  Job:  [id number]

## 2011-05-31 ENCOUNTER — Encounter: Payer: Self-pay | Admitting: Internal Medicine

## 2011-05-31 ENCOUNTER — Ambulatory Visit (INDEPENDENT_AMBULATORY_CARE_PROVIDER_SITE_OTHER): Payer: Medicare Other | Admitting: Internal Medicine

## 2011-05-31 DIAGNOSIS — I2589 Other forms of chronic ischemic heart disease: Secondary | ICD-10-CM

## 2011-05-31 DIAGNOSIS — I4892 Unspecified atrial flutter: Secondary | ICD-10-CM

## 2011-05-31 DIAGNOSIS — I4891 Unspecified atrial fibrillation: Secondary | ICD-10-CM | POA: Insufficient documentation

## 2011-05-31 DIAGNOSIS — Z95810 Presence of automatic (implantable) cardiac defibrillator: Secondary | ICD-10-CM | POA: Insufficient documentation

## 2011-05-31 DIAGNOSIS — I442 Atrioventricular block, complete: Secondary | ICD-10-CM | POA: Insufficient documentation

## 2011-05-31 DIAGNOSIS — Z950 Presence of cardiac pacemaker: Secondary | ICD-10-CM

## 2011-05-31 DIAGNOSIS — I255 Ischemic cardiomyopathy: Secondary | ICD-10-CM

## 2011-05-31 LAB — PACEMAKER DEVICE OBSERVATION
AL AMPLITUDE: 5 mv
AL IMPEDENCE PM: 460 Ohm
AL THRESHOLD: 1 V
RV LEAD AMPLITUDE: 12 mv
RV LEAD IMPEDENCE PM: 590 Ohm
RV LEAD THRESHOLD: 0.87 V

## 2011-05-31 MED ORDER — RIVAROXABAN 20 MG PO TABS: 20.0000 mg | ORAL_TABLET | Freq: Every day | ORAL | Status: DC

## 2011-05-31 NOTE — Progress Notes (Signed)
HPI  [name] is a 71 y.o. male Seen in followup for iatrogenic heart block caused her to atrial flutter catheter ablation. The patient received a single lesion and developed complete heart block. Pacing was undertaken the following day. This was September 2012.  He is doing relatively well and did not denies chest pain, shortness of breath or palpitations.  Thromboembolic risk factors are notable for age-#1, hypertension-#1, vascular disease-#1, left ventricular dysfunction-#1 for a CHADS-VASc score of 4 Past Medical History  Diagnosis Date  . Ischemic cardiomyopathy     EF 40% by heart cath 01/2011   . Hypertension   . Hyperlipidemia   . GERD (gastroesophageal reflux disease)   . Anxiety   . CAD (coronary artery disease)     s/p CABGx4 in 1991, with redo surgery in 2007 with  SVG to an OM and sequential SVG to the PDA and PLV, recent cath 01/2011 with patent grafts  . Atrial flutter October 2012    S/P ablation - not successful  . CHB (complete heart block) October 2012    S/P PTVP following ablation  . Kidney cysts     CYST ON BOTH KIDNEYS  . Hiatal hernia   . Fibromyalgia   . Depression     Past Surgical History  Procedure Date  . Cardiac catheterization 10/30/2009    Grafts patent. Mild to moderate LV dysfunction. EF 45%. Managed medically.   . Coronary artery bypass graft     Originial surgery in 1991 with LIMA to LAD, SVG to OM/LCX, SVG to PD & PL; Redo surgery in 2007 with SVG to OM and SVG to PD & PL  . Hernia repair   . Tonsillectomy   . Eye surgery     BOTH  CATERACTS  REMOVED WITH REPLACEMENT LENSES  . Insert / replace / remove pacemaker   . Cholecystectomy   . Coronary artery bypass graft     Current Outpatient Prescriptions  Medication Sig Dispense Refill  . ALPRAZolam (XANAX) 1 MG tablet Take 0.5 mg by mouth 3 (three) times daily as needed.       [name] Kitchen aspirin 81 MG EC tablet Take 81 mg by mouth daily.        [name] Kitchen atorvastatin (LIPITOR) 80 MG tablet  Take 40 mg by mouth daily.       . Calcium Carbonate-Vitamin D (CALCIUM + D PO) Take 1 tablet by mouth daily.       . carvedilol (COREG) 12.5 MG tablet Take 12.5 mg by mouth 2 (two) times daily with a meal.      . Docusate Sodium (COLACE PO) Take 1 capsule by mouth daily.       [name] Kitchen doxepin (SINEQUAN) 25 MG capsule Take 25 mg by mouth 3 (three) times daily.        . fexofenadine (ALLEGRA) 60 MG tablet Take 60 mg by mouth as needed. For allergy symptoms      . gabapentin (NEURONTIN) 100 MG capsule Take 100 mg by mouth 4 (four) times daily.       . isosorbide mononitrate (IMDUR) 60 MG 24 hr tablet Take 60 mg by mouth daily.        . Multiple Vitamin (MULTIVITAMIN) tablet Take 1 tablet by mouth daily.        . nitroGLYCERIN (NITROSTAT) 0.4 MG SL tablet Place 0.4 mg under the tongue every 5 (five) minutes as needed. For chest pain      . omeprazole (PRILOSEC) 20 MG capsule Take  20 mg by mouth 2 (two) times daily.        . sucralfate (CARAFATE) 1 G tablet Take 1 g by mouth 3 (three) times daily.          Allergies  Allergen Reactions  . Sulfa Drugs Cross Reactors Other (See Comments)    unknown  . Ultram (Tramadol Hcl) Itching    Review of Systems negative except from HPI and PMH  Physical Exam BP 142/77  Pulse 75  Ht 5\' 11"  (1.803 m)  Wt 196 lb 12.8 oz (89.268 kg)  BMI 27.45 kg/m2 Well developed and well nourished in no acute distress HENT normal E scleral and icterus clear Neck Supple JVP flat; carotids brisk and full Clear to ausculation Redular rate and rhythm, no murmurs gallops or rub Soft with active bowel sounds No clubbing cyanosis none Edema Alert and oriented, grossly normal motor and sensory function Skin Warm and Dry  ECG P-synchronous/ AV  pacing   Assessment and  Plan

## 2011-05-31 NOTE — Assessment & Plan Note (Signed)
The patient's device was interrogated and the information was fully reviewed.  The device was reprogrammed to  Maximize longevity 

## 2011-05-31 NOTE — Assessment & Plan Note (Signed)
ith intrinsic conduction now with prolonged PR interval

## 2011-05-31 NOTE — Assessment & Plan Note (Signed)
Pt has recurrent AF and with CHADSVASc score of 4 should appropriately treated with oral anticoagulation  We have chosen to use Rivaroxaban I have reviewed benefits and risks incl bleeeding and will d/c asa

## 2011-05-31 NOTE — Patient Instructions (Signed)
Your physician has recommended you make the following change in your medication:  1) Stop Aspirin 2) Start Xarelto 20 mg one tablet by mouth once daily.  Your physician wants you to follow-up in: September 2013 with Dr. Graciela Husbands. You will receive a reminder letter in the mail two months in advance. If you don't receive a letter, please call our office to schedule the follow-up appointment.

## 2011-05-31 NOTE — Assessment & Plan Note (Signed)
Continue current meds 

## 2011-06-01 ENCOUNTER — Telehealth: Payer: Self-pay | Admitting: Internal Medicine

## 2011-06-01 DIAGNOSIS — I10 Essential (primary) hypertension: Secondary | ICD-10-CM

## 2011-06-01 NOTE — Telephone Encounter (Signed)
Has cyst on kidneys and concerned about taking the Xarelto, is this ok.   Can he take Mucinex with this?   Also taking generic Norvasc 5 mg twice a day -- this not on med list  Is ok with taking Xarelto now since he knows he would have to come frequently for labs with warfarin.  Will  do whatever  Dr. Patty Sermons recommends.

## 2011-06-01 NOTE — Telephone Encounter (Signed)
New Problem:     Patient wanted to speak with you about several things another physicial told him to do.

## 2011-06-01 NOTE — Telephone Encounter (Signed)
New problem Pt wants to talk to you about xeralto He said it is too expensive.

## 2011-06-01 NOTE — Telephone Encounter (Signed)
Okay for patient to take xarelto and Mucinex

## 2011-06-01 NOTE — Telephone Encounter (Signed)
Patient states Dr. Graciela Husbands gave him samples of Xarelto and find out if he is able to afford the medication before start the medication. Patient has not started the medication because he won't be able to afford it. It is $330.00 for 30 tablets. He will continue take the usual aspirin and wait for a new medication to be called.

## 2011-06-02 ENCOUNTER — Telehealth: Payer: Self-pay | Admitting: Internal Medicine

## 2011-06-02 NOTE — Telephone Encounter (Signed)
Will forward to [facility] for review.

## 2011-06-02 NOTE — Telephone Encounter (Signed)
I spoke with the patient. He is going to proceed with Xarelto. He also wanted to let us know that he is on amlodipine 5 mg one tablet by mouth twice daily.

## 2011-06-02 NOTE — Telephone Encounter (Signed)
FU Call: Pt calling wanting to inform Sherri Rad that pt has a complex cysts on one of his kidneys and a regular cysts on the other and pt wanted to inform nurse of this.  Pt is going to start taking xarelto and wanted to let nurse know of pt kidney condition and make sure it is all right for pt to continue on the take medication.   Please return pt call to discuss further.

## 2011-06-02 NOTE — Telephone Encounter (Signed)
Fu call Patient is calling back about xeralto and his other meds. Please call

## 2011-06-02 NOTE — Telephone Encounter (Signed)
F/U   Patient has additional questions about medication, he can be reached at hm#

## 2011-06-03 NOTE — Telephone Encounter (Signed)
Fu call Patient calling back again 

## 2011-06-03 NOTE — Telephone Encounter (Signed)
I spoke with the patient. I made him aware that he is ok to proceed with his xarelto.

## 2011-06-08 ENCOUNTER — Telehealth: Payer: Self-pay | Admitting: Internal Medicine

## 2011-06-08 NOTE — Telephone Encounter (Signed)
Patient states that he may have had a reaction of the blood thinner medication Xarelto 20 mg last night. It was the 6 th dose patient had taken. Pt also took Tylenol 325/30 mg codeine for headache. Two hrs later pt started itching all over his body , also he was having tingling and numbness on toes. Patient said he has the numbness on toes before due to neuropathy. Patient is aware that it could be the Codeine that is making him have these side effects. Patient denies any symptoms now. He will take the evening Xarelto medication this evening and see it he has the same reaction again. Pt is aware to call if needed.

## 2011-06-08 NOTE — Telephone Encounter (Signed)
If pt is not at home plz call cell number and pt thinks he is having a reaction to a medication and he want to talk to someone

## 2011-06-13 ENCOUNTER — Telehealth: Payer: Self-pay | Admitting: Internal Medicine

## 2011-06-13 NOTE — Telephone Encounter (Signed)
I spoke with the patient. He states that he started itching last week after he took a dose of tylenol w/ codeine. He called poison control and our office on 2/6 and was told this may be due to the codeine. He states he did not take any more and the itching stopped. This started up again for him last night and scared him. He took a dose of benadryl about an hour and a half ago and his symptoms are better. The only new drug he is on is xarelto. He took his last dose last night. He will hold his dose tonight and I will call him tomorrow to see if his symptoms are any better.

## 2011-06-13 NOTE — Telephone Encounter (Signed)
Follow-up:     Patient called in again wanting to speak with someone about his previous issue.  States that now he has tingling down both of his arms and he doesn't feel quite right when he walks around. Please call back.

## 2011-06-13 NOTE — Telephone Encounter (Signed)
New msg: pt calling wanting to know if MD wants pt to continue taking xarelto. Pt said it is causing pt to itch all over. Please return pt call to discuss further.    Alt # [id number]

## 2011-06-14 ENCOUNTER — Telehealth: Payer: Self-pay | Admitting: *Deleted

## 2011-06-14 NOTE — Telephone Encounter (Signed)
Message copied by Burnell Blanks on Tue Jun 14, 2011  3:48 PM ------      Message from: Cassell Clement      Created: Tue Jun 14, 2011  2:02 PM      Regarding: FW: Xarelto       Hold xarelto another day then restart and see if the itching returns.      The itching may be from something other than the xarelto      ----- Message -----         From: Leeanne Mannan, LPN         Sent: 06/13/2011  10:51 AM           To: Regis Bill, LPN, Cassell Clement, MD      Subject: Carlena Hurl                                                  Mr. Dufresne called to report itching all over since taking his 11th dose of xarelto.  Pt is convinced the itching is from his xarelto.  Per Lawson Fiscal, advised pt to hold xarelto tonight and send this message to [facility] and Dr. Patty Sermons for further review and recommendations.      -Kandee Keen

## 2011-06-14 NOTE — Telephone Encounter (Signed)
Spoke to Dr Odessa Fleming nurse and he said restart Thursday night and well let us know

## 2011-06-14 NOTE — Telephone Encounter (Signed)
I spoke with the patient this morning. He states that he took 4 doses of benadryl total yesterday with the last dose being around 9 pm. He skipped his xarelto last night. He has not taken any benadryl this morning and does feel better. I explained I will review with Dr. Graciela Husbands and see if he recommends holding another day or two, then restart to see if symptoms re-occur, or do we switch to coumadin. I will call him back. Sherri Rad, RN, BSN   Per Dr. Graciela Husbands, hold xarelto another 2 days, then restart to see if symptoms reoccur. If they do, d/c xarelto and start warfarin. I have notified the patient of Dr. Odessa Fleming recommendations. He will take his next xarelto on Thursday night. He will call us back on Friday if he starts to itch at all. If so, we can start warfarin 5 mg and check an INR 4 days after starting warfarin. The patient would like to be followed at Dr. Kathi Der office in [place]. We will need to call them if he starts warfarin and needs to be followed there. The patient voices understanding. Sherri Rad, RN, BSN

## 2011-06-15 ENCOUNTER — Telehealth: Payer: Self-pay | Admitting: Cardiology

## 2011-06-15 DIAGNOSIS — I4891 Unspecified atrial fibrillation: Secondary | ICD-10-CM

## 2011-06-15 MED ORDER — CARVEDILOL 12.5 MG PO TABS: 12.5000 mg | ORAL_TABLET | Freq: Two times a day (BID) | ORAL | Status: DC

## 2011-06-15 MED ORDER — ISOSORBIDE MONONITRATE ER 60 MG PO TB24: 60.0000 mg | ORAL_TABLET | Freq: Every day | ORAL | Status: DC

## 2011-06-15 MED ORDER — WARFARIN SODIUM 5 MG PO TABS: ORAL_TABLET | ORAL | Status: DC

## 2011-06-15 NOTE — Telephone Encounter (Signed)
Advised patient.  Will follow up with Dr Kathi Der office.  Will call them and let us know if they didn't receive or they need additional information

## 2011-06-15 NOTE — Telephone Encounter (Signed)
New Problem   Patient would like a return call at  [phone], regarding possible Rx interaction. Patient currently taking benadryl.

## 2011-06-15 NOTE — Telephone Encounter (Signed)
The Xarelto commonly causes itching.  We can stop the Zaroxolyn oh and switch him either to Pradaxa or warfarin.

## 2011-06-15 NOTE — Telephone Encounter (Signed)
Give him 5 mg tablets and have him start today with half tablet daily and get INR on Monday.

## 2011-06-15 NOTE — Telephone Encounter (Signed)
Would like to try Warfarin, how would you like to start.  Will have INR's done and followed by PCP, when would like first INR?

## 2011-06-15 NOTE — Telephone Encounter (Signed)
Last Xarelto Sunday night, Dr Graciela Husbands said to resume tomorrow night and today he is itching really bad.  Denies any rash, just itching. Could the Xarelto still be causing this?  If not, any other meds?  Patient states he has not changed anything (lotions, soaps, laundry detergent, etc), but has moved into a new apartment with carpet.  Has PCP. Will forward to  Dr. Patty Sermons for review

## 2011-06-16 ENCOUNTER — Telehealth: Payer: Self-pay | Admitting: Cardiology

## 2011-06-16 NOTE — Telephone Encounter (Signed)
Information sent to Dr [facility] office again

## 2011-06-16 NOTE — Telephone Encounter (Signed)
New problem:  Patient calling regarding blood work - [place].

## 2011-07-12 ENCOUNTER — Telehealth: Payer: Self-pay | Admitting: Internal Medicine

## 2011-07-12 NOTE — Telephone Encounter (Signed)
I spoke with the patient and gave ok.

## 2011-07-12 NOTE — Telephone Encounter (Signed)
New problem:  Patient calling has a  H/O pacer maker.  Patient brought a combo answer machine / phone with no battery. Can he use his phone on left ear

## 2011-07-27 ENCOUNTER — Encounter: Payer: Self-pay | Admitting: Cardiology

## 2011-07-27 ENCOUNTER — Other Ambulatory Visit: Payer: Medicare Other

## 2011-07-27 ENCOUNTER — Ambulatory Visit (INDEPENDENT_AMBULATORY_CARE_PROVIDER_SITE_OTHER): Payer: Medicare Other | Admitting: Cardiology

## 2011-07-27 VITALS — BP 130/78 | HR 80 | Ht 70.0 in | Wt 193.0 lb

## 2011-07-27 DIAGNOSIS — I4892 Unspecified atrial flutter: Secondary | ICD-10-CM

## 2011-07-27 DIAGNOSIS — Z951 Presence of aortocoronary bypass graft: Secondary | ICD-10-CM

## 2011-07-27 DIAGNOSIS — E7800 Pure hypercholesterolemia, unspecified: Secondary | ICD-10-CM

## 2011-07-27 DIAGNOSIS — I119 Hypertensive heart disease without heart failure: Secondary | ICD-10-CM

## 2011-07-27 DIAGNOSIS — F419 Anxiety disorder, unspecified: Secondary | ICD-10-CM

## 2011-07-27 DIAGNOSIS — I2510 Atherosclerotic heart disease of native coronary artery without angina pectoris: Secondary | ICD-10-CM

## 2011-07-27 DIAGNOSIS — F411 Generalized anxiety disorder: Secondary | ICD-10-CM

## 2011-07-27 LAB — BASIC METABOLIC PANEL
Calcium: 8.8 mg/dL (ref 8.4–10.5)
GFR: 64.07 mL/min (ref >60.00)
Glucose, Bld: 88 mg/dL (ref 70–99)
Potassium: 4.4 mEq/L (ref 3.5–5.1)
Sodium: 138 mEq/L (ref 135–145)

## 2011-07-27 LAB — LIPID PANEL
Cholesterol: 121 mg/dL (ref 0–200)
HDL: 38.7 mg/dL — ABNORMAL LOW (ref >39.00)
VLDL: 18.4 mg/dL (ref 0.0–40.0)

## 2011-07-27 LAB — HEPATIC FUNCTION PANEL
ALT: 20 U/L (ref 0–53)
AST: 20 U/L (ref 0–37)
Albumin: 4.1 g/dL (ref 3.5–5.2)
Total Bilirubin: 0.9 mg/dL (ref 0.3–1.2)
Total Protein: 6.9 g/dL (ref 6.0–8.3)

## 2011-07-27 NOTE — Assessment & Plan Note (Signed)
The patient still has a moderate amount of anxiety but it appears to be channeled to better and his psychiatrist has helped him a great deal

## 2011-07-27 NOTE — Progress Notes (Signed)
[name] Date of Birth:  07-29-1940 [facility] [address] [place], [place]  [address] [phone]         Fax   [phone]  History of Present Illness: This pleasant 71 year old gentleman is seen for a scheduled followup office visit.  He has a history of known ischemic heart disease.  He has a past history of coronary artery bypass graft surgery in 1991 with a redo in 2007 and a most recent cardiac catheter in October 2012 showed patent grafts.  The patient has had frequent hospitalizations for rule out myocardial infarction.  The patient has a high overlay of anxiety which will frequently trigger chest pain.  He has been seeing a psychiatrist who has been very helpful to him.  The patient is under a lot of stress.  His wife has some senile dementia and had to be placed in a nursing home and has been quite bitter toward her husband for putting her in a nursing home.  Again today I reassured [name] that he had done more than enough in trying to look after her at home despite his own ill health.  His family and ourselves finally insisted that he no longer try to look after her multiple medical needs at home.  He now visits her about once a week. Since last visit the patient has been feeling better.  He has had very little chest discomfort.  Current Outpatient Prescriptions  Medication Sig Dispense Refill  . ALPRAZolam (XANAX) 1 MG tablet Take 0.5 mg by mouth 3 (three) times daily as needed.       [name] Kitchen amLODipine (NORVASC) 5 MG tablet Take 1 tablet (5 mg total) by mouth daily.      [name] Kitchen atorvastatin (LIPITOR) 80 MG tablet Take 40 mg by mouth daily.       . Calcium Carbonate-Vitamin D (CALCIUM + D PO) Take 1 tablet by mouth daily.       . carvedilol (COREG) 12.5 MG tablet Take 1 tablet (12.5 mg total) by mouth 2 (two) times daily with a meal.  180 tablet  3  . Docusate Sodium (COLACE PO) Take 1 capsule by mouth daily.       [name] Kitchen doxepin (SINEQUAN) 25 MG capsule Take  25 mg by mouth 3 (three) times daily.        . fexofenadine (ALLEGRA) 60 MG tablet Take 60 mg by mouth as needed. For allergy symptoms      . gabapentin (NEURONTIN) 100 MG capsule Take 100 mg by mouth 4 (four) times daily.       . isosorbide mononitrate (IMDUR) 60 MG 24 hr tablet Take 1 tablet (60 mg total) by mouth daily.  90 tablet  3  . Multiple Vitamin (MULTIVITAMIN) tablet Take 1 tablet by mouth daily.        . nitroGLYCERIN (NITROSTAT) 0.4 MG SL tablet Place 0.4 mg under the tongue every 5 (five) minutes as needed. For chest pain      . omeprazole (PRILOSEC) 20 MG capsule Take 20 mg by mouth 2 (two) times daily.        . sucralfate (CARAFATE) 1 G tablet Take 1 g by mouth 3 (three) times daily.        [name] Kitchen warfarin (COUMADIN) 5 MG tablet 1/2 daily or as directed  15 tablet  11    Allergies  Allergen Reactions  . Sulfa Drugs Cross Reactors Other (See Comments)    unknown  . Ultram (Tramadol Hcl)  Itching    Patient Active Problem List  Diagnoses  . Ischemic cardiomyopathy  . Hypertension  . Hyperlipidemia  . GERD (gastroesophageal reflux disease)  . Anxiety  . Fibromyalgia  . Hx of CABG  . Cough, persistent  . Paroxysmal atrial flutter  . Chest pain  . Atrial fibrillation  . Complete heart block-intermittent  . Pacemaker-stJudes    History  Smoking status  . Former Smoker  . Quit date: 09/21/1973  Smokeless tobacco  . Never Used    History  Alcohol Use No    Family History  Problem Relation Age of Onset  . Stroke Mother   . Heart attack Father     Review of Systems: Constitutional: no fever chills diaphoresis or fatigue or change in weight.  Head and neck: no hearing loss, no epistaxis, no photophobia or visual disturbance. Respiratory: No cough, shortness of breath or wheezing. Cardiovascular: No chest pain peripheral edema, palpitations. Gastrointestinal: No abdominal distention, no abdominal pain, no change in bowel habits hematochezia or  melena. Genitourinary: No dysuria, no frequency, no urgency, no nocturia. Musculoskeletal:No arthralgias, no back pain, no gait disturbance or myalgias. Neurological: No dizziness, no headaches, no numbness, no seizures, no syncope, no weakness, no tremors. Hematologic: No lymphadenopathy, no easy bruising. Psychiatric: No confusion, no hallucinations, no sleep disturbance.    Physical Exam: Filed Vitals:   07/27/11 1112  BP: 130/78  Pulse: 80   the general appearance reveals a well-developed well-nourished gentleman in no distress.The head and neck exam reveals pupils equal and reactive.  Extraocular movements are full.  There is no scleral icterus.  The mouth and pharynx are normal.  The neck is supple.  The carotids reveal no bruits.  The jugular venous pressure is normal.  The  thyroid is not enlarged.  There is no lymphadenopathy.  The chest is clear to percussion and auscultation.  There are no rales or rhonchi.  Expansion of the chest is symmetrical.  The precordium is quiet.  The first heart sound is normal.  The second heart sound is physiologically split.  There is no murmur gallop rub or click.  There is no abnormal lift or heave.  The abdomen is soft and nontender.  The bowel sounds are normal.  The liver and spleen are not enlarged.  There are no abdominal masses.  There are no abdominal bruits.  Extremities reveal good pedal pulses.  There is no phlebitis or edema.  There is no cyanosis or clubbing.  Strength is normal and symmetrical in all extremities.  There is no lateralizing weakness.  There are no sensory deficits.  The skin is warm and dry.  There is no rash.     Assessment / Plan:  The patient is to continue same medication.  Will plan to recheck him in about 3 months.

## 2011-07-27 NOTE — Patient Instructions (Signed)
Your physician recommends that you continue on your current medications as directed. Please refer to the Current Medication list given to you today.  Your physician recommends that you schedule a follow-up appointment in: 3 months with Dr. Brackbill.   

## 2011-07-27 NOTE — Assessment & Plan Note (Signed)
The patient has a past history of atrial flutter.  He underwent an ablation for atrial flutter.  Subsequent to that he developed heart block and had to have a dual-chamber pacemaker which is working well.  He was initially placed on Xarelto but it caused itching and he had to be switched to Coumadin.  He has his blood tested and followed in a Coumadin clinic at one of the outlying locations near to his home.  The patient has not been having any complications or side effects from his Coumadin.

## 2011-07-27 NOTE — Assessment & Plan Note (Signed)
The patient has been using his treadmill on a daily basis.  He walks about 1 mile per day on the treadmill.  The patient has not been having any exertional chest pain on the treadmill

## 2011-07-27 NOTE — Progress Notes (Signed)
Quick Note:  Please report to patient. The recent labs are stable. Continue same medication and careful diet. ______ 

## 2011-08-03 ENCOUNTER — Telehealth: Payer: Self-pay | Admitting: *Deleted

## 2011-08-03 NOTE — Telephone Encounter (Signed)
Close  

## 2011-08-03 NOTE — Telephone Encounter (Signed)
Message copied by Burnell Blanks on Wed Aug 03, 2011  9:14 AM ------      Message from: Cassell Clement      Created: Wed Jul 27, 2011  9:22 PM       Please report to patient.  The recent labs are stable. Continue same medication and careful diet.

## 2011-08-03 NOTE — Telephone Encounter (Signed)
Advised patient

## 2011-08-08 ENCOUNTER — Telehealth: Payer: Self-pay | Admitting: Cardiology

## 2011-08-08 ENCOUNTER — Other Ambulatory Visit: Payer: Self-pay | Admitting: *Deleted

## 2011-08-08 MED ORDER — OMEPRAZOLE 20 MG PO CPDR: 20.0000 mg | DELAYED_RELEASE_CAPSULE | Freq: Two times a day (BID) | ORAL | Status: DC

## 2011-08-08 NOTE — Telephone Encounter (Signed)
Was seen by PCP and EKG done and checked out ok.  Did get Rx for Phenergan and used it and felt a little better.  Advised to just rest and if no better call PCP back.

## 2011-08-08 NOTE — Telephone Encounter (Signed)
Refilled omeprazole.

## 2011-08-08 NOTE — Telephone Encounter (Signed)
New msg Pt wants to talk to you about nausea he is having. He said he has no chest pain. Please call

## 2011-08-12 ENCOUNTER — Other Ambulatory Visit: Payer: Self-pay | Admitting: Cardiology

## 2011-08-12 MED ORDER — SUCRALFATE 1 G PO TABS: 1.0000 g | ORAL_TABLET | Freq: Three times a day (TID) | ORAL | Status: DC

## 2011-08-24 ENCOUNTER — Telehealth: Payer: Self-pay | Admitting: Cardiology

## 2011-08-24 NOTE — Telephone Encounter (Signed)
New msg Pt called.  Pt said his wife passed away and he wanted to talk to you. Please call

## 2011-08-24 NOTE — Telephone Encounter (Signed)
Called and spoke with patient.

## 2011-08-25 ENCOUNTER — Telehealth: Payer: Self-pay | Admitting: Cardiology

## 2011-08-25 DIAGNOSIS — R609 Edema, unspecified: Secondary | ICD-10-CM

## 2011-08-25 MED ORDER — FUROSEMIDE 40 MG PO TABS: 40.0000 mg | ORAL_TABLET | Freq: Every day | ORAL | Status: DC | PRN

## 2011-08-25 NOTE — Telephone Encounter (Signed)
Advised patient and will call back if worse or no better

## 2011-08-25 NOTE — Telephone Encounter (Signed)
Ankles and up leg swelling, right worse than left.  May have increase sodium some over weekend, but has decreased since then.  Denies redness or discoloration.  Is painful, states feels like a band around right leg, goes about half way up leg.  Does go down at night but when gets up in morning and start walking around it comes back.  Is keeping it elevated.  Noticed an increase in swelling Sunday morning.  Will forward to  Dr. Patty Sermons for review

## 2011-08-25 NOTE — Telephone Encounter (Signed)
New msg Pt wants to talk about leg swelling. Please call

## 2011-08-25 NOTE — Telephone Encounter (Signed)
Add Lasix 40 mg daily for 2 days, then when necessary for fluid retention

## 2011-08-26 ENCOUNTER — Telehealth: Payer: Self-pay | Admitting: Cardiology

## 2011-08-26 NOTE — Telephone Encounter (Signed)
Patient phoned this am and stated that since taking his Lasix he had only urinated twice.  He called pharmacist and was told by him to call us and report since it should work right away and has had not it could be a prostate problem.  Discussed with patient and asked if he felt like he needed to urinate and couldn't he said no.  Asked if he was having any discomfort and he said he was having some "down there".  Did advised if he was having problems "down there" he needed to call his urologist.  He did say that his swelling has gone down in his legs.  Did discuss with  Dr. Patty Sermons and will just have him not tomorrow as advised.  Advised patient

## 2011-08-26 NOTE — Telephone Encounter (Signed)
Please return call to patient [phone] who is at his daughter house.  Patient is having problems with fluid pill. Please return call to patient at [phone]

## 2011-09-20 ENCOUNTER — Telehealth: Payer: Self-pay | Admitting: Cardiology

## 2011-09-20 NOTE — Telephone Encounter (Signed)
Advised ok to do early in day or later in the evening but not to over exert himself

## 2011-09-20 NOTE — Telephone Encounter (Signed)
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 Please call

## 2011-09-29 ENCOUNTER — Telehealth: Payer: Self-pay | Admitting: Cardiology

## 2011-09-29 NOTE — Telephone Encounter (Signed)
It will be okay to leave off lipitor for 30 days but must be extremely careful with diet during that time.

## 2011-09-29 NOTE — Telephone Encounter (Signed)
Pt has a Rx for 30 day supply of antibiotics and he wants to know does he need that much

## 2011-09-29 NOTE — Telephone Encounter (Signed)
Dermatologist gave him 30 days of Doxycycline 50 mg bid for rosacea to take for 30 days.  Was told not to take with his Lipitor. Concerned about not taking his cholesterol for that long.  Will forward to  Dr. Patty Sermons for review

## 2011-09-30 ENCOUNTER — Telehealth: Payer: Self-pay | Admitting: Cardiology

## 2011-09-30 NOTE — Telephone Encounter (Signed)
Documentation in phone call from yesterday, patient was returning call

## 2011-09-30 NOTE — Telephone Encounter (Signed)
Advised patient

## 2011-09-30 NOTE — Telephone Encounter (Signed)
Pt calling re melinda to all him today re medication

## 2011-10-10 ENCOUNTER — Telehealth: Payer: Self-pay | Admitting: Cardiology

## 2011-10-10 NOTE — Telephone Encounter (Signed)
Please return call to patient at [phone] regarding surgical clearance questions.

## 2011-10-10 NOTE — Telephone Encounter (Signed)
Discussed with patient and will just continue follow up with urologist.

## 2011-10-20 ENCOUNTER — Observation Stay (HOSPITAL_COMMUNITY)
Admission: EM | Admit: 2011-10-20 | Discharge: 2011-10-21 | Disposition: A | Discharge status: Home / Self Care | Payer: Medicare Other | Source: Ambulatory Visit | Attending: Cardiology | Admitting: Cardiology

## 2011-10-20 ENCOUNTER — Encounter (HOSPITAL_COMMUNITY): Payer: Self-pay

## 2011-10-20 ENCOUNTER — Emergency Department (HOSPITAL_COMMUNITY): Payer: Medicare Other

## 2011-10-20 DIAGNOSIS — I2589 Other forms of chronic ischemic heart disease: Secondary | ICD-10-CM | POA: Insufficient documentation

## 2011-10-20 DIAGNOSIS — Q618 Other cystic kidney diseases: Secondary | ICD-10-CM | POA: Insufficient documentation

## 2011-10-20 DIAGNOSIS — F419 Anxiety disorder, unspecified: Secondary | ICD-10-CM | POA: Diagnosis present

## 2011-10-20 DIAGNOSIS — Z7901 Long term (current) use of anticoagulants: Secondary | ICD-10-CM | POA: Insufficient documentation

## 2011-10-20 DIAGNOSIS — I2510 Atherosclerotic heart disease of native coronary artery without angina pectoris: Secondary | ICD-10-CM

## 2011-10-20 DIAGNOSIS — IMO0001 Reserved for inherently not codable concepts without codable children: Secondary | ICD-10-CM | POA: Insufficient documentation

## 2011-10-20 DIAGNOSIS — I4892 Unspecified atrial flutter: Secondary | ICD-10-CM | POA: Diagnosis present

## 2011-10-20 DIAGNOSIS — Z950 Presence of cardiac pacemaker: Secondary | ICD-10-CM | POA: Insufficient documentation

## 2011-10-20 DIAGNOSIS — I4891 Unspecified atrial fibrillation: Secondary | ICD-10-CM | POA: Insufficient documentation

## 2011-10-20 DIAGNOSIS — R609 Edema, unspecified: Secondary | ICD-10-CM

## 2011-10-20 DIAGNOSIS — I442 Atrioventricular block, complete: Secondary | ICD-10-CM | POA: Insufficient documentation

## 2011-10-20 DIAGNOSIS — K449 Diaphragmatic hernia without obstruction or gangrene: Secondary | ICD-10-CM | POA: Insufficient documentation

## 2011-10-20 DIAGNOSIS — K219 Gastro-esophageal reflux disease without esophagitis: Secondary | ICD-10-CM | POA: Diagnosis present

## 2011-10-20 DIAGNOSIS — I10 Essential (primary) hypertension: Secondary | ICD-10-CM | POA: Diagnosis present

## 2011-10-20 DIAGNOSIS — E785 Hyperlipidemia, unspecified: Secondary | ICD-10-CM | POA: Diagnosis present

## 2011-10-20 DIAGNOSIS — R079 Chest pain, unspecified: Principal | ICD-10-CM

## 2011-10-20 DIAGNOSIS — I255 Ischemic cardiomyopathy: Secondary | ICD-10-CM | POA: Diagnosis present

## 2011-10-20 HISTORY — DX: Presence of cardiac pacemaker: Z95.0

## 2011-10-20 HISTORY — DX: Elevated prostate specific antigen (PSA): R97.20

## 2011-10-20 HISTORY — DX: Unspecified atrial fibrillation: I48.91

## 2011-10-20 LAB — CBC
HCT: 40.6 % (ref 39.0–52.0)
Hemoglobin: 13.9 g/dL (ref 13.0–17.0)
MCH: 30.3 pg (ref 26.0–34.0)
MCHC: 34.2 g/dL (ref 30.0–36.0)
MCV: 88.6 fL (ref 78.0–100.0)
Platelets: 164 K/uL (ref 150–400)
RBC: 4.58 MIL/uL (ref 4.22–5.81)
RDW: 13.2 % (ref 11.5–15.5)
WBC: 5.4 10*3/uL (ref 4.0–10.5)

## 2011-10-20 LAB — BASIC METABOLIC PANEL
CO2: 25 mEq/L (ref 19–32)
Calcium: 8.6 mg/dL (ref 8.4–10.5)
Creatinine, Ser: 1.05 mg/dL (ref 0.50–1.35)
GFR calc non Af Amer: 69 mL/min — ABNORMAL LOW (ref >90)
Glucose, Bld: 102 mg/dL — ABNORMAL HIGH (ref 70–99)

## 2011-10-20 LAB — DIFFERENTIAL
Basophils Absolute: 0 10*3/uL (ref 0.0–0.1)
Basophils Relative: 0 % (ref 0–1)
Eosinophils Absolute: 0.2 10*3/uL (ref 0.0–0.7)
Eosinophils Relative: 3 % (ref 0–5)
Lymphocytes Relative: 25 % (ref 12–46)
Lymphs Abs: 1.4 10*3/uL (ref 0.7–4.0)
Monocytes Absolute: 0.5 10*3/uL (ref 0.1–1.0)
Monocytes Relative: 9 % (ref 3–12)
Neutro Abs: 3.4 K/uL (ref 1.7–7.7)
Neutrophils Relative %: 62 % (ref 43–77)

## 2011-10-20 LAB — BASIC METABOLIC PANEL WITH GFR
BUN: 14 mg/dL (ref 6–23)
Chloride: 102 meq/L (ref 96–112)
GFR calc Af Amer: 80 mL/min — ABNORMAL LOW (ref >90)
Potassium: 4 meq/L (ref 3.5–5.1)
Sodium: 136 meq/L (ref 135–145)

## 2011-10-20 LAB — CARDIAC PANEL(CRET KIN+CKTOT+MB+TROPI)
CK, MB: 1.8 ng/mL (ref 0.3–4.0)
Troponin I: <0.3 ng/mL (ref <0.30)

## 2011-10-20 LAB — PROTIME-INR
INR: 2.47 — ABNORMAL HIGH (ref 0.00–1.49)
Prothrombin Time: 27.2 s — ABNORMAL HIGH (ref 11.6–15.2)

## 2011-10-20 LAB — TROPONIN I: Troponin I: <0.3 ng/mL (ref <0.30)

## 2011-10-20 MED ORDER — DOCUSATE SODIUM 100 MG PO CAPS
100.0000 mg | ORAL_CAPSULE | Freq: Every day | ORAL | Status: DC | PRN
  Filled 2011-10-20: qty 1

## 2011-10-20 MED ORDER — AMLODIPINE BESYLATE 5 MG PO TABS
5.0000 mg | ORAL_TABLET | Freq: Two times a day (BID) | ORAL | Status: DC
  Administered 2011-10-20 – 2011-10-21 (×2): 5 mg via ORAL
  Filled 2011-10-20 (×3): qty 1

## 2011-10-20 MED ORDER — NITROGLYCERIN IN D5W 200-5 MCG/ML-% IV SOLN
2.0000 ug/min | Freq: Once | INTRAVENOUS | Status: AC
  Administered 2011-10-20: 5 ug/min via INTRAVENOUS
  Filled 2011-10-20: qty 250

## 2011-10-20 MED ORDER — SODIUM CHLORIDE 0.9 % IJ SOLN: 3.0000 mL | INTRAMUSCULAR | Status: DC | PRN

## 2011-10-20 MED ORDER — CARVEDILOL 12.5 MG PO TABS
12.5000 mg | ORAL_TABLET | Freq: Two times a day (BID) | ORAL | Status: DC
  Administered 2011-10-20 – 2011-10-21 (×2): 12.5 mg via ORAL
  Filled 2011-10-20 (×4): qty 1

## 2011-10-20 MED ORDER — LISINOPRIL 5 MG PO TABS
5.0000 mg | ORAL_TABLET | Freq: Every day | ORAL | Status: DC
  Administered 2011-10-20 – 2011-10-21 (×2): 5 mg via ORAL
  Filled 2011-10-20 (×2): qty 1

## 2011-10-20 MED ORDER — ONDANSETRON HCL 4 MG/2ML IJ SOLN
4.0000 mg | Freq: Four times a day (QID) | INTRAMUSCULAR | Status: DC | PRN
  Filled 2011-10-20: qty 2

## 2011-10-20 MED ORDER — ACETAMINOPHEN 325 MG PO TABS: 650.0000 mg | ORAL_TABLET | ORAL | Status: DC | PRN

## 2011-10-20 MED ORDER — GABAPENTIN 100 MG PO CAPS
100.0000 mg | ORAL_CAPSULE | Freq: Four times a day (QID) | ORAL | Status: DC
  Administered 2011-10-20 – 2011-10-21 (×2): 100 mg via ORAL
  Filled 2011-10-20 (×6): qty 1

## 2011-10-20 MED ORDER — FUROSEMIDE 40 MG PO TABS
40.0000 mg | ORAL_TABLET | Freq: Every day | ORAL | Status: DC | PRN
  Filled 2011-10-20: qty 1

## 2011-10-20 MED ORDER — SODIUM CHLORIDE 0.9 % IJ SOLN
3.0000 mL | Freq: Two times a day (BID) | INTRAMUSCULAR | Status: DC
  Administered 2011-10-20: 3 mL via INTRAVENOUS

## 2011-10-20 MED ORDER — ZOLPIDEM TARTRATE 5 MG PO TABS: 5.0000 mg | ORAL_TABLET | Freq: Every evening | ORAL | Status: DC | PRN

## 2011-10-20 MED ORDER — PANTOPRAZOLE SODIUM 40 MG PO TBEC
40.0000 mg | DELAYED_RELEASE_TABLET | Freq: Two times a day (BID) | ORAL | Status: DC
  Administered 2011-10-21: 40 mg via ORAL
  Filled 2011-10-20: qty 1

## 2011-10-20 MED ORDER — ATORVASTATIN CALCIUM 40 MG PO TABS
40.0000 mg | ORAL_TABLET | Freq: Every day | ORAL | Status: DC
  Administered 2011-10-20: 40 mg via ORAL
  Filled 2011-10-20 (×2): qty 1

## 2011-10-20 MED ORDER — SODIUM CHLORIDE 0.9 % IV SOLN: 250.0000 mL | INTRAVENOUS | Status: DC | PRN

## 2011-10-20 MED ORDER — WARFARIN - PHARMACIST DOSING INPATIENT: Freq: Every day | Status: DC

## 2011-10-20 MED ORDER — ALPRAZOLAM 0.5 MG PO TABS
1.0000 mg | ORAL_TABLET | Freq: Three times a day (TID) | ORAL | Status: DC | PRN
  Administered 2011-10-21: 1 mg via ORAL
  Filled 2011-10-20: qty 2

## 2011-10-20 MED ORDER — NITROGLYCERIN 0.4 MG SL SUBL: 0.4000 mg | SUBLINGUAL_TABLET | SUBLINGUAL | Status: DC | PRN

## 2011-10-20 MED ORDER — WARFARIN SODIUM 5 MG PO TABS
5.0000 mg | ORAL_TABLET | Freq: Once | ORAL | Status: AC
  Administered 2011-10-20: 5 mg via ORAL
  Filled 2011-10-20: qty 1

## 2011-10-20 MED ORDER — FENTANYL CITRATE 0.05 MG/ML IJ SOLN
12.5000 ug | Freq: Once | INTRAMUSCULAR | Status: AC
  Administered 2011-10-20: 12.5 ug via INTRAVENOUS
  Filled 2011-10-20: qty 2

## 2011-10-20 MED ORDER — ISOSORBIDE MONONITRATE ER 60 MG PO TB24
60.0000 mg | ORAL_TABLET | Freq: Every day | ORAL | Status: DC
  Administered 2011-10-20 – 2011-10-21 (×2): 60 mg via ORAL
  Filled 2011-10-20 (×2): qty 1

## 2011-10-20 NOTE — ED Notes (Signed)
MD at bedside. 

## 2011-10-20 NOTE — ED Provider Notes (Signed)
History     CSN: [id number]  Arrival date & time 10/20/11  1478   First MD Initiated Contact with Patient 10/20/11 [phone]      Chief Complaint  Patient presents with  . Chest Pain    (Consider location/radiation/quality/duration/timing/severity/associated sxs/prior treatment) HPI Comments: Patient reports that he was in his usual state of health and was walking on his treadmill which he does every morning. He usually walks about 20 minutes and gets to a distance of 1 mile. He has a history of coronary disease status post CABG in 1991. He had a cardiac cath according to old records in October 2012 which showed no significant disease. He follows closely with his primary care physician and cardiologist Dr. Patty Sermons. He reports toward the end of his 20 minutes he began having some substernal nonradiating dull chest pressure discomfort that got to about a 7/10 intensity. He has taken 3 nitroglycerin without any significant improvement. Currently he reports the pain is 5/10. He is brought here by EMS. He is on Coumadin do to a history of atrial fibrillation and therefore did not take any aspirin. He also has a pacemaker. He reports that when he took his blood pressure, his heart rate was greater than 100 during his symptom onset. He reports he is currently mildly nauseated, but did not experience any significant diaphoresis, has not vomited and does not feel particularly short of breath. He does have a long history of anxiety, however the patient denies feeling anxious currently. He does endorse that his spouse passed away about 2 months ago.  Patient is a 71 y.o. male presenting with chest pain. The history is provided by the patient, medical records and the EMS personnel.  Chest Pain Primary symptoms include nausea. Pertinent negatives for primary symptoms include no vomiting.  Pertinent negatives for associated symptoms include no diaphoresis and no numbness.     Past Medical History  Diagnosis  Date  . Ischemic cardiomyopathy     EF 40% by heart cath 01/2011   . Hypertension   . Hyperlipidemia   . GERD (gastroesophageal reflux disease)   . Anxiety   . CAD (coronary artery disease)     s/p CABGx4 in 1991, with redo surgery in 2007 with  SVG to an OM and sequential SVG to the PDA and PLV, recent cath 01/2011 with patent grafts  . Atrial flutter October 2012    S/P ablation - not successful  . CHB (complete heart block) October 2012    S/P PTVP following ablation  . Kidney cysts     CYST ON BOTH KIDNEYS  . Hiatal hernia   . Fibromyalgia   . Depression   . Other "heavy-for-dates" infants elevated PSA level  . A-fib     Past Surgical History  Procedure Date  . Cardiac catheterization 10/30/2009    Grafts patent. Mild to moderate LV dysfunction. EF 45%. Managed medically.   . Coronary artery bypass graft     2007 February  . Hernia repair   . Tonsillectomy   . Eye surgery     BOTH  CATERACTS  REMOVED WITH REPLACEMENT LENSES  . Insert / replace / remove pacemaker   . Cholecystectomy   . Coronary artery bypass graft     Family History  Problem Relation Age of Onset  . Stroke Mother   . Heart attack Father     History  Substance Use Topics  . Smoking status: Former Smoker    Quit date: 09/21/1973  .  Smokeless tobacco: Never Used  . Alcohol Use: No      Review of Systems  Constitutional: Negative for diaphoresis.  Cardiovascular: Positive for chest pain.  Gastrointestinal: Positive for nausea. Negative for vomiting.  Musculoskeletal: Negative for back pain.  Skin: Negative for wound.  Neurological: Negative for light-headedness, numbness and headaches.  All other systems reviewed and are negative.    Allergies  Sulfa drugs cross reactors; Ultram; and Xarelto  Home Medications   Current Outpatient Rx  Name Route Sig Dispense Refill  . ALPRAZOLAM 1 MG PO TABS Oral Take 1 mg by mouth 3 (three) times daily as needed. For anxiety    . AMLODIPINE  BESYLATE 5 MG PO TABS Oral Take 5 mg by mouth 2 (two) times daily.     . ATORVASTATIN CALCIUM 40 MG PO TABS Oral Take 40 mg by mouth daily.    [name] Kitchen CALCIUM + D PO Oral Take 1 tablet by mouth daily.     [name] Kitchen CARVEDILOL 12.5 MG PO TABS Oral Take 1 tablet (12.5 mg total) by mouth 2 (two) times daily with a meal. 180 tablet 3  . COLACE PO Oral Take 1 capsule by mouth daily.     [name] Kitchen DOXEPIN HCL 25 MG PO CAPS Oral Take 25 mg by mouth 3 (three) times daily.      [name] Kitchen FEXOFENADINE HCL 60 MG PO TABS Oral Take 60 mg by mouth as needed. For allergy symptoms    . GABAPENTIN 100 MG PO CAPS Oral Take 100 mg by mouth 4 (four) times daily.     . ISOSORBIDE MONONITRATE ER 60 MG PO TB24 Oral Take 1 tablet (60 mg total) by mouth daily. 90 tablet 3  . ONE-DAILY MULTI VITAMINS PO TABS Oral Take 1 tablet by mouth daily.      [name] Kitchen NITROGLYCERIN 0.4 MG SL SUBL Sublingual Place 0.4 mg under the tongue every 5 (five) minutes as needed. For chest pain    . OMEPRAZOLE 20 MG PO CPDR Oral Take 1 capsule (20 mg total) by mouth 2 (two) times daily. 180 capsule 3  . OVER THE COUNTER MEDICATION Both Eyes Place 1 drop into both eyes 4 (four) times daily as needed. For cataracts    . SUCRALFATE 1 G PO TABS Oral Take 1 g by mouth as needed. For stomach ulcers    . WARFARIN SODIUM 5 MG PO TABS Oral Take 2.5 mg by mouth daily. 1/2 (2.5 mg) tab on Monday, Wednesday, and Friday all other days pt takes 1 tablet (5 mg)    . FUROSEMIDE 40 MG PO TABS Oral Take 40 mg by mouth daily as needed. For fluid      BP 103/68  Pulse 64  Temp 97.8 F (36.6 C) (Oral)  Resp 12  Ht 5\' 11"  (1.803 m)  Wt 195 lb (88.451 kg)  BMI 27.20 kg/m2  SpO2 97%  Physical Exam  Constitutional: He is oriented to person, place, and time. He appears well-developed and well-nourished. No distress.  HENT:  Head: Normocephalic and atraumatic.  Eyes: Pupils are equal, round, and reactive to light. No scleral icterus.  Neck: Neck supple.  Cardiovascular: Normal rate.   No  murmur heard. Pulmonary/Chest: Effort normal. No respiratory distress. He has no wheezes.  Abdominal: Soft. Bowel sounds are normal.  Neurological: He is alert and oriented to person, place, and time.  Skin: Skin is warm and dry. No rash noted. He is not diaphoretic.  Psychiatric: He has a normal mood and affect.  ED Course  Procedures (including critical care time)  CRITICAL CARE Performed by: Lear Ng.   Total critical care time: 35 min  Critical care time was exclusive of separately billable procedures and treating other patients.  Critical care was necessary to treat or prevent imminent or life-threatening deterioration.  Critical care was time spent personally by me on the following activities: development of treatment plan with patient and/or surrogate as well as nursing, discussions with consultants, evaluation of patient's response to treatment, examination of patient, obtaining history from patient or surrogate, ordering and performing treatments and interventions, ordering and review of laboratory studies, ordering and review of radiographic studies, pulse oximetry and re-evaluation of patient's condition.   Labs Reviewed  BASIC METABOLIC PANEL - Abnormal; Notable for the following:    Glucose, Bld 102 (*)     GFR calc non Af Amer 69 (*)     GFR calc Af Amer 80 (*)     All other components within normal limits  PROTIME-INR - Abnormal; Notable for the following:    Prothrombin Time 27.2 (*)     INR 2.47 (*)     All other components within normal limits  CBC  DIFFERENTIAL  TROPONIN I   Dg Chest 2 View  10/20/2011  *RADIOLOGY REPORT*  Clinical Data: Chest pain.  CHEST - 2 VIEW  Comparison: 05/20/2011  Findings: Left pacer remains in place, unchanged.  Prior CABG. Heart is upper limits normal in size.  Lungs are clear.  No effusions or acute bony abnormality.  IMPRESSION: No acute findings.  Original Report Authenticated By: Cyndie Chime, M.D.   I reviewed  films myself and reviewed radiologist interpretation.    1. Chest pain     ECG at time 0939, sinus rhythm, ventricular pacing, rate 66, PR prolongation.  Similar in appearance to ECG from 05/20/11.     12:03 PM Pain was improved to a 2/10 on minimal NTG, BP is improved to 110/70.  Pt given small dose of fentanyl and I discussed with [facility] cardiology team to consult on pt in the ED.     MDM  Pt with sig cardiac risks, ongoing CP that is 5/10, will check labs, troponin. NTG drip for any continued pain.  Concern for UA, although pt doesn't appear sig distressed at present.  Will hold  On heparin and ASA since already on coumadin.  On monitor, appears to be sinus, p waves seen, rate 65, ventricular pacing present.          Gavin Pound. Jaydn Fincher, MD 10/20/11 1204

## 2011-10-20 NOTE — Progress Notes (Signed)
Initial observation review is complete. 

## 2011-10-20 NOTE — ED Notes (Signed)
Cardiologist at bedside.  

## 2011-10-20 NOTE — ED Notes (Signed)
Pt arrival to room D32 via EMS, substernal CP while walking on treadmill.  No other symptoms. Pt does have an extensive cardiac history. VSS 104/59, 72 HR, 97 RA, pt is in a paced rhythm.

## 2011-10-20 NOTE — ED Notes (Signed)
Pt. Undress gown on pt. Pt.place on monitor . ekg done.

## 2011-10-20 NOTE — Progress Notes (Signed)
ANTICOAGULATION CONSULT NOTE - Initial Consult  Pharmacy Consult for coumadin Indication: atrial fibrillation  Allergies  Allergen Reactions  . Sulfa Drugs Cross Reactors Other (See Comments)    unknown  . Ultram (Tramadol Hcl) Itching  . Xarelto (Rivaroxaban) Other (See Comments)    Pt doesn't remember the reaction but physician told to stop taking it.    Patient Measurements: Height: 5\' 11"  (180.3 cm) Weight: 195 lb (88.451 kg) IBW/kg (Calculated) : 75.3  Heparin Dosing Weight:   Vital Signs: Temp: 97.5 F (36.4 C) (06/20 1438) Temp src: Oral (06/20 1438) BP: 132/79 mmHg (06/20 1438) Pulse Rate: 64  (06/20 1438)  Labs:  Basename 10/20/11 0945  HGB 13.9  HCT 40.6  PLT 164  APTT --  LABPROT 27.2*  INR 2.47*  HEPARINUNFRC --  CREATININE 1.05  CKTOTAL --  CKMB --  TROPONINI <0.30    Estimated Creatinine Clearance: 68.7 ml/min (by C-G formula based on Cr of 1.05).   Medical History: Past Medical History  Diagnosis Date  . Ischemic cardiomyopathy     EF 40% by heart cath 01/2011   . Hypertension   . Hyperlipidemia   . GERD (gastroesophageal reflux disease)   . Anxiety   . CAD (coronary artery disease)     s/p CABGx4 in 1991, with redo surgery in 2007 with  SVG to an OM and sequential SVG to the PDA and PLV, recent cath 01/2011 with patent grafts  . Atrial flutter October 2012    S/P ablation - not successful  . CHB (complete heart block) October 2012    S/P PTVP following ablation  . Kidney cysts     CYST ON BOTH KIDNEYS  . Hiatal hernia   . Fibromyalgia   . Depression   . Other "heavy-for-dates" infants elevated PSA level  . A-fib     Medications:  Scheduled:    . fentaNYL  12.5 mcg Intravenous Once  . nitroGLYCERIN  2-200 mcg/min Intravenous Once   Infusions:    Assessment: 71 yo male with atrial fibrillation will be continued on coumadin therapy.  Patient was on coumadin 5mg  po qday, except 2.5mg  po MWF prior to admission.  INR today  2.47 Goal of Therapy:  INR 2-3    Plan:  1) Coumadin 5mg  po x1 tonight 2) Daily PT/INR  Margaurite Salido, Tsz-Yin 10/20/2011,2:44 PM

## 2011-10-20 NOTE — ED Notes (Signed)
Heart Healthy tray ordered spoke with Carolyn 

## 2011-10-20 NOTE — H&P (Signed)
Cardiology Admission Note   Patient ID: [name] MRN: [id number], DOB/AGE: 1941/02/12   Admit date: 10/20/2011 Date of Consult: 10/20/2011  Primary Physician: Multiple providers- [facility] Primary Cardiologist: Cassell Clement, MD  Pt. Profile: [name] is a 71yo Caucasian male with PMHx significant for CAD (CABG x 4 in 1991, redo 2007- SVG-OM, SVG-PDA-PLV, patent grafts on cardiac cath 10/12), ICM (LVEF 40% by cath 07/11), history of atrial flutter (s/p unsuccessful RFA, on chronic Coumadin) c/b complete heart block (s/p St. Jude PPM 10/12), HTN, HL, GERD and anxiety who presents to [facility] ED today with c/o chest pain.   Problem List: Past Medical History  Diagnosis Date  . Ischemic cardiomyopathy     EF 40% by heart cath 01/2011   . Hypertension   . Hyperlipidemia   . GERD (gastroesophageal reflux disease)   . Anxiety   . CAD (coronary artery disease)     s/p CABGx4 in 1991, with redo surgery in 2007 with  SVG to an OM and sequential SVG to the PDA and PLV, recent cath 01/2011 with patent grafts  . Atrial flutter October 2012    S/P ablation - not successful  . CHB (complete heart block) October 2012    S/P PTVP following ablation  . Kidney cysts     CYST ON BOTH KIDNEYS  . Hiatal hernia   . Fibromyalgia   . Depression   . Other "heavy-for-dates" infants elevated PSA level  . A-fib     Past Surgical History  Procedure Date  . Cardiac catheterization 10/30/2009    Grafts patent. Mild to moderate LV dysfunction. EF 45%. Managed medically.   . Coronary artery bypass graft     2007 February  . Hernia repair   . Tonsillectomy   . Eye surgery     BOTH  CATERACTS  REMOVED WITH REPLACEMENT LENSES  . Insert / replace / remove pacemaker   . Cholecystectomy   . Coronary artery bypass graft      Allergies:  Allergies  Allergen Reactions  . Sulfa Drugs Cross Reactors Other (See Comments)    unknown  . Ultram (Tramadol Hcl) Itching  . Xarelto  (Rivaroxaban) Other (See Comments)    Pt doesn't remember the reaction but physician told to stop taking it.    HPI:   He last saw Dr. Patty Sermons in clinic in 03/13. He had been doing well. He has a history of multiple admissions for ACS rule out due to chest pain. This is driven in large part by anxiety. He had cared for his wife with dementia at home, and placed her in a nursing home. This caused significant anxiety. He sees a psychiatrist for this which has helped.   Patient had multiple calls to the office since that time. He had difficulty with LE edema and tenderness. This improved on Lasix PRN. Believed to be related to dietary indiscretion.   Device last interrogated on 01/13 with normal function.   He tells me that he has been under significant anxiety recently. His wife passed in 04/13. His PSA has increased from 5.5 to ~ 9.5 in three months. He was started on ciprofloxacin by his urologist ~12 days ago. He is scheduled to have another PSA test on 11/02/11.   He also notes he has had a tick bite behind his left knee. He followed up with his PCP. Had blood work for Lyme disease, results pending.   This morning he walked 22 minutes on the treadmill.  He usually walks 15 minutes at baseline. He did not realize how far he had walked. At the 22-minute mark, he reported experiencing constant, SSCP described as dull, without radiation rated at a 5-6/10. He took NTG SL x 3 without alleviation. No association with meals. This was remniscent of his prior admission for chest pain for which he ruled out for ACS. He walked for 20 minutes yesterday without incident. He is very active without decrease in exercise tolerance. He noted his pulse to be 100. SBP 120. He endorses associated nausea. He reports medication compliance. He denies shortness of breath, diaphoresis, lightheadedness, palpitations. No orthopnea, PND or DOE. No fevers, chills, new cough or sick contacts. He denies active bleeding. EMS  transported the patient to the ED. On interviewing, the patient endorses SSCP pain rated at 1-2/10.   Upon ED arrival, initial troponin-I WNL. CXR unremkarkable. INR WNL. CBC and BMET unremarkable. EKG reveals V-pacing. He was started on NTG gtt.   Home Medications: Prior to Admission medications   Medication Sig Start Date End Date Taking? Authorizing Provider  ALPRAZolam Prudy Feeler) 1 MG tablet Take 1 mg by mouth 3 (three) times daily as needed. For anxiety 02/28/11  Yes Cassell Clement, MD  amLODipine (NORVASC) 5 MG tablet Take 5 mg by mouth 2 (two) times daily.  06/02/11  Yes Duke Salvia, MD  atorvastatin (LIPITOR) 40 MG tablet Take 40 mg by mouth daily.   Yes Historical Provider, MD  Calcium Carbonate-Vitamin D (CALCIUM + D PO) Take 1 tablet by mouth daily.    Yes Historical Provider, MD  carvedilol (COREG) 12.5 MG tablet Take 1 tablet (12.5 mg total) by mouth 2 (two) times daily with a meal. 06/15/11  Yes Cassell Clement, MD  Docusate Sodium (COLACE PO) Take 1 capsule by mouth daily.    Yes Historical Provider, MD  doxepin (SINEQUAN) 25 MG capsule Take 25 mg by mouth 3 (three) times daily.     Yes Historical Provider, MD  fexofenadine (ALLEGRA) 60 MG tablet Take 60 mg by mouth as needed. For allergy symptoms   Yes Historical Provider, MD  gabapentin (NEURONTIN) 100 MG capsule Take 100 mg by mouth 4 (four) times daily.    Yes Historical Provider, MD  isosorbide mononitrate (IMDUR) 60 MG 24 hr tablet Take 1 tablet (60 mg total) by mouth daily. 06/15/11  Yes Cassell Clement, MD  Multiple Vitamin (MULTIVITAMIN) tablet Take 1 tablet by mouth daily.     Yes Historical Provider, MD  nitroGLYCERIN (NITROSTAT) 0.4 MG SL tablet Place 0.4 mg under the tongue every 5 (five) minutes as needed. For chest pain 04/29/11  Yes Cassell Clement, MD  omeprazole (PRILOSEC) 20 MG capsule Take 1 capsule (20 mg total) by mouth 2 (two) times daily. 08/08/11  Yes Cassell Clement, MD  OVER THE COUNTER MEDICATION Place  1 drop into both eyes 4 (four) times daily as needed. For cataracts   Yes Historical Provider, MD  sucralfate (CARAFATE) 1 G tablet Take 1 g by mouth as needed. For stomach ulcers 08/12/11  Yes Cassell Clement, MD  warfarin (COUMADIN) 5 MG tablet Take 2.5 mg by mouth daily. 1/2 (2.5 mg) tab on Monday, Wednesday, and Friday all other days pt takes 1 tablet (5 mg) 06/15/11  Yes Cassell Clement, MD  furosemide (LASIX) 40 MG tablet Take 40 mg by mouth daily as needed. For fluid 08/25/11 10/20/11  Cassell Clement, MD    Inpatient Medications:     . fentaNYL  12.5 mcg Intravenous Once  .  nitroGLYCERIN  2-200 mcg/min Intravenous Once    (Not in a hospital admission)  Family History  Problem Relation Age of Onset  . Stroke Mother   . Heart attack Father      History   Social History  . Marital Status: Legally Separated    Spouse Name: N/A    Number of Children: N/A  . Years of Education: N/A   Occupational History  . Not on file.   Social History Main Topics  . Smoking status: Former Smoker    Quit date: 09/21/1973  . Smokeless tobacco: Never Used  . Alcohol Use: No  . Drug Use: No  . Sexually Active: No   Other Topics Concern  . Not on file   Social History Narrative   Wife passed away in 09-16-2011. Lives alone.      Review of Systems: General: negative for chills, fever, night sweats or weight changes.  Cardiovascular: positive for chest pain, negative for dyspnea on exertion, edema, orthopnea, palpitations, paroxysmal nocturnal dyspnea or shortness of breath Dermatological: negative for rash Respiratory: negative for cough or wheezing Urologic: negative for hematuria Abdominal: positive for nausea, vomiting, diarrhea, bright red blood per rectum, melena, or hematemesis Neurologic: negative for visual changes, syncope, or dizziness Psych: pressured speech, anxious-appearing All other systems reviewed and are otherwise negative except as noted above.  Physical  Exam: Blood pressure 121/68, pulse 60, temperature 97.8 F (36.6 C), temperature source Oral, resp. rate 12, height 5\' 11"  (1.803 m), weight 88.451 kg (195 lb), SpO2 98.00%.    General: Well developed, well nourished, in no acute distress. Head: Normocephalic, atraumatic, sclera non-icteric, no xanthomas, nares are without discharge.  Neck: Negative for carotid bruits. JVD not elevated. Lungs: Fine babasilar rales noted on exam. Clear bilaterally to auscultation without wheezes, rales, or rhonchi. Breathing is unlabored. Heart: RRR with S1 S2. No murmurs, rubs, or gallops appreciated. Abdomen: Soft, non-tender, non-distended with normoactive bowel sounds. No hepatomegaly. No rebound/guarding. No obvious abdominal masses. Msk:  Strength and tone appears normal for age. Extremities: No clubbing, cyanosis or edema.  Diminished DP/PT pulses LLE. Dital pedal pulses are 2+ on RLE. [name] Kitchen Neuro: Alert and oriented X 3. Moves all extremities spontaneously. Psych:  Responds to questions appropriately with a normal affect.  Labs: Recent Labs  Basename 10/20/11 0945   WBC 5.4   HGB 13.9   HCT 40.6   MCV 88.6   PLT 164    Lab 10/20/11 0945  NA 136  K 4.0  CL 102  CO2 25  BUN 14  CREATININE 1.05  CALCIUM 8.6  PROT --  BILITOT --  ALKPHOS --  ALT --  AST --  AMYLASE --  LIPASE --  GLUCOSE 102*   Recent Labs  Basename 10/20/11 0945   CKTOTAL --   CKMB --   CKMBINDEX --   TROPONINI <0.30   Radiology/Studies: Dg Chest 2 View  10/20/2011  *RADIOLOGY REPORT*  Clinical Data: Chest pain.  CHEST - 2 VIEW  Comparison: 05/20/2011  Findings: Left pacer remains in place, unchanged.  Prior CABG. Heart is upper limits normal in size.  Lungs are clear.  No effusions or acute bony abnormality.  IMPRESSION: No acute findings.  Original Report Authenticated By: Cyndie Chime, M.D.   EKG: V-paced, 66 bpm  ASSESSMENT AND PLAN:   1. CAD/chest pain- patient with history of CAD noted above, and  significant cardiac risk factors. Patient is very active, and had not experienced episodes of chest pain  for some time prior to this morning. He is usually able to walk a mile a day without incident. This morning, he walked longer than usual, and experienced SSCP without radiation. This persisted without relief from NTG SL, thus prompting his presentation to [facility] ED. In the ED, troponin-I negative x 1. EKG V-paced. INR WNL. CXR, CBC and BMET unremarkable. INR WNL. VSS. HPI consistent with stable angina pectoris. Suspicious for patient over-exerting himself this morning with associated transient myocardial ischemia (however chest pain constant and not completely relieved on interview). Given CAD history and cardiac risk factors, will admit for ACS rule-out.   - Admit to observation for ACS rule-out  - Cycle cardiac biomarkers  - Continue BB, statin, Imdur  - Would add low-dose ACEi if BP tolerates  - Heart healthy diet    2. Ischemic cardiomyopathy- LVEF 45% on cath 10/12. Denies orthopnea, LE edema, PND, increased DOE or new cough. Euvolemic on exam. Lungs and CXR clear.   - Continue Lasix PRN  - Will add low-dose ACEi as above  - Continue BB  - Heart healthy diet  3. Atrial flutter- s/p unsuccessful RFA 10/12. On chronic Coumadin. INR therapeutic today. Permanent V-pacing.   - Coumadin per pharmacy  - Interrogate device (St. Jude PPM)   4. Complete HB- s/p St. Jude PPM. V-pacing on telemetry, occasional A-V pacing.   5. Diminished DP/PT pulses- LLE. No evidence of claudication or ulceration.  - Continue to monitor  - Consider outpatient LE dopplers   6. Hypertension- good BP control today.  - Continue home antihypertensives  7. Hyperlipidemia- lipid panel 03/13 revealed very good control  - Continue statin   8. GERD  - Continue PPI  9. Anxiety  - Continue outpatient Xanax   Signed, R. Hurman Horn, PA-C 10/20/2011, 1:52 PM  History and all data above reviewed.  Patient examined.   I agree with the findings as above.  The patient exam reveals COR:RRR  ,  Lungs: Few basilar crackles  ,  Abd: Positive bowel sounds, no rebound no guarding, Ext No edema  .  All available labs, radiology testing, previous records reviewed. Agree with documented assessment and plan. His pain happened only after 23 minutes of exercise.  There is thus far no objective evidence of ischemia.  I reviewed the cath report from last year.  We will admit overnight.  I would not suggest that further imaging will be needed however if his pain resolves and his enzymes are not markedly elevated.  Maximillian Habibi  6:55 PM  10/20/2011

## 2011-10-21 LAB — BASIC METABOLIC PANEL
BUN: 13 mg/dL (ref 6–23)
Chloride: 102 mEq/L (ref 96–112)
Glucose, Bld: 81 mg/dL (ref 70–99)
Potassium: 4 mEq/L (ref 3.5–5.1)

## 2011-10-21 LAB — PROTIME-INR
INR: 2.01 — ABNORMAL HIGH (ref 0.00–1.49)
Prothrombin Time: 23.1 s — ABNORMAL HIGH (ref 11.6–15.2)

## 2011-10-21 LAB — HEMOGLOBIN A1C
Hgb A1c MFr Bld: 5.7 % — ABNORMAL HIGH (ref <5.7)
Mean Plasma Glucose: 117 mg/dL — ABNORMAL HIGH (ref <117)

## 2011-10-21 LAB — CBC
HCT: 40.5 % (ref 39.0–52.0)
Hemoglobin: 13.6 g/dL (ref 13.0–17.0)
MCHC: 33.6 g/dL (ref 30.0–36.0)
WBC: 6.1 10*3/uL (ref 4.0–10.5)

## 2011-10-21 MED ORDER — LISINOPRIL 5 MG PO TABS: 5.0000 mg | ORAL_TABLET | Freq: Every day | ORAL | Status: DC

## 2011-10-21 NOTE — Progress Notes (Signed)
SUBJECTIVE:  No further chest pain.  No SOB   PHYSICAL EXAM Filed Vitals:   10/20/11 1700 10/20/11 1813 10/20/11 2100 10/21/11 0500  BP: 135/79 131/74 110/66 102/61  Pulse: 66 68 65 67  Temp: 97.8 F (36.6 C)  97.6 F (36.4 C) 97.8 F (36.6 C)  TempSrc: Oral     Resp: 12  16 16   Height:      Weight:      SpO2: 98%  97% 97%   General:  No distress Lungs:  Clear Heart:  RRR Abdomen:  Positive bowel sounds, no rebound no guarding Extremities:  No edema  LABS: Lab Results  Component Value Date   CKTOTAL 52 10/20/2011   CKMB 1.8 10/20/2011   TROPONINI <0.30 10/20/2011   Results for orders placed during the hospital encounter of 10/20/11 (from the past 24 hour(s))  CBC     Status: Normal   Collection Time   10/20/11  9:45 AM      Component Value Range   WBC 5.4  4.0 - 10.5 K/uL   RBC 4.58  4.22 - 5.81 MIL/uL   Hemoglobin 13.9  13.0 - 17.0 g/dL   HCT 16.1  09.6 - 04.5 %   MCV 88.6  78.0 - 100.0 fL   MCH 30.3  26.0 - 34.0 pg   MCHC 34.2  30.0 - 36.0 g/dL   RDW 40.9  81.1 - 91.4 %   Platelets 164  150 - 400 K/uL  DIFFERENTIAL     Status: Normal   Collection Time   10/20/11  9:45 AM      Component Value Range   Neutrophils Relative 62  43 - 77 %   Neutro Abs 3.4  1.7 - 7.7 K/uL   Lymphocytes Relative 25  12 - 46 %   Lymphs Abs 1.4  0.7 - 4.0 K/uL   Monocytes Relative 9  3 - 12 %   Monocytes Absolute 0.5  0.1 - 1.0 K/uL   Eosinophils Relative 3  0 - 5 %   Eosinophils Absolute 0.2  0.0 - 0.7 K/uL   Basophils Relative 0  0 - 1 %   Basophils Absolute 0.0  0.0 - 0.1 K/uL  BASIC METABOLIC PANEL     Status: Abnormal   Collection Time   10/20/11  9:45 AM      Component Value Range   Sodium 136  135 - 145 mEq/L   Potassium 4.0  3.5 - 5.1 mEq/L   Chloride 102  96 - 112 mEq/L   CO2 25  19 - 32 mEq/L   Glucose, Bld 102 (*) 70 - 99 mg/dL   BUN 14  6 - 23 mg/dL   Creatinine, Ser 7.82  0.50 - 1.35 mg/dL   Calcium 8.6  8.4 - 95.6 mg/dL   GFR calc non Af Amer 69 (*) >90  mL/min   GFR calc Af Amer 80 (*) >90 mL/min  TROPONIN I     Status: Normal   Collection Time   10/20/11  9:45 AM      Component Value Range   Troponin I <0.30  <0.30 ng/mL  PROTIME-INR     Status: Abnormal   Collection Time   10/20/11  9:45 AM      Component Value Range   Prothrombin Time 27.2 (*) 11.6 - 15.2 seconds   INR 2.47 (*) 0.00 - 1.49  CARDIAC PANEL(CRET KIN+CKTOT+MB+TROPI)     Status: Normal   Collection Time  10/20/11  3:28 PM      Component Value Range   Total CK 58  7 - 232 U/L   CK, MB 1.9  0.3 - 4.0 ng/mL   Troponin I <0.30  <0.30 ng/mL   Relative Index RELATIVE INDEX IS INVALID  0.0 - 2.5  HEMOGLOBIN A1C     Status: Abnormal   Collection Time   10/20/11  3:28 PM      Component Value Range   Hemoglobin A1C 5.7 (*) <5.7 %   Mean Plasma Glucose 117 (*) <117 mg/dL  CARDIAC PANEL(CRET KIN+CKTOT+MB+TROPI)     Status: Normal   Collection Time   10/20/11  9:01 PM      Component Value Range   Total CK 52  7 - 232 U/L   CK, MB 1.8  0.3 - 4.0 ng/mL   Troponin I <0.30  <0.30 ng/mL   Relative Index RELATIVE INDEX IS INVALID  0.0 - 2.5  PROTIME-INR     Status: Abnormal   Collection Time   10/21/11  5:00 AM      Component Value Range   Prothrombin Time 23.1 (*) 11.6 - 15.2 seconds   INR 2.01 (*) 0.00 - 1.49  CBC     Status: Normal   Collection Time   10/21/11  5:00 AM      Component Value Range   WBC 6.1  4.0 - 10.5 K/uL   RBC 4.55  4.22 - 5.81 MIL/uL   Hemoglobin 13.6  13.0 - 17.0 g/dL   HCT 16.1  09.6 - 04.5 %   MCV 89.0  78.0 - 100.0 fL   MCH 29.9  26.0 - 34.0 pg   MCHC 33.6  30.0 - 36.0 g/dL   RDW 40.9  81.1 - 91.4 %   Platelets 165  150 - 400 K/uL  BASIC METABOLIC PANEL     Status: Abnormal   Collection Time   10/21/11  5:00 AM      Component Value Range   Sodium 136  135 - 145 mEq/L   Potassium 4.0  3.5 - 5.1 mEq/L   Chloride 102  96 - 112 mEq/L   CO2 29  19 - 32 mEq/L   Glucose, Bld 81  70 - 99 mg/dL   BUN 13  6 - 23 mg/dL   Creatinine, Ser 7.82  0.50  - 1.35 mg/dL   Calcium 8.7  8.4 - 95.6 mg/dL   GFR calc non Af Amer 65 (*) >90 mL/min   GFR calc Af Amer 75 (*) >90 mL/min    Intake/Output Summary (Last 24 hours) at 10/21/11 0750 Last data filed at 10/21/11 0500  Gross per 24 hour  Intake    125 ml  Output   2950 ml  Net  -2825 ml    EKG:  Sinus rhythm with paced vent rhythm rate 63.  10/21/2011  ASSESSMENT AND PLAN:  CAD:  No further chest pain.  OK to go home.  No change in therapy.  Ischemic Cardiomyopathy:  Euvolemic.  Continue current therapy.  Atrial Flutter:  Continue warfarin  CHB:  Status post pacemaker.    OK to discharge.  He has follow up with Dr. Patty Sermons next week.  [name] 10/21/2011 7:50 AM

## 2011-10-21 NOTE — Progress Notes (Signed)
Pt in stable condition, assessment unchanged from this am. D/C'd via wheelchair to private vehicle

## 2011-10-21 NOTE — Discharge Summary (Signed)
Discharge Summary   Patient ID: [name] MRN: [id number], DOB/AGE: [birth year] 71 y.o.  Primary MD: Multiple providers- [facility] Primary Cardiologist: Cassell Clement, MD  Admit date: 10/20/2011 D/C date:     10/21/2011     Primary Discharge Diagnoses:  1. Chest Pain  - No objective evidence of ischemia  Secondary Discharge Diagnoses:  1. CAD (CABG x 4 in 1991, redo 2007- SVG-OM, SVG-PDA-PLV, patent grafts on cardiac cath 10/12) 2. Ischemic cardiomyopathy (EF 40% by heart cath 01/2011) 3. Atrial fib/flutter (s/p unsuccessful RFA 01/2011, on chronic Coumadin) 4. Complete heart block (s/p St. Jude PPM 10/12) 5. Hypertension  6. Hyperlipidemia  7. Kidney cysts - bilat 8. Hiatal hernia  9. GERD  10. Anxiety  11. Fibromyalgia  12. Depression  13. Hernia repair  14. Tonsillectomy  15. Cataract extraction - bilat 16. Cholecystectomy   Allergies Allergies  Allergen Reactions  . Sulfa Drugs Cross Reactors Other (See Comments)    unknown  . Ultram (Tramadol Hcl) Itching    "don't remember how bad"  . Xarelto (Rivaroxaban) Other (See Comments)    Pt doesn't remember the reaction but physician told to stop taking it.    Diagnostic Studies/Procedures:  None  History of Present Illness: 71 y.o. male w/ the above medical problems who presented to [facility] on 10/20/11 with complaints of chest pain. He reported constant substernal chest pain after walking 22 minutes on his treadmill the morning of presentation prompting him to present to the ED.  Hospital Course: Upon ED arrival, initial troponin-I WNL. CXR unremkarkable. INR WNL. CBC and BMET unremarkable. EKG reveals V-pacing. He was started on NTG gtt and admitted for further evaluation and treatment.   Lisinopril was added to his medication regimen and blood pressure closely monitored. Cardiac enzymes were cycled and remained negative. He had no further episodes of chest pain and was able to  ambulate without complaints. No arrhythmias on telemetry. He was seen and evaluated by Dr. Antoine Poche who felt he was stable for discharge home with plans for follow up as scheduled below.  Discharge Vitals: Blood pressure 102/61, pulse 67, temperature 97.8 F (36.6 C), temperature source Oral, resp. rate 16, height 5\' 11"  (1.803 m), weight 195 lb (88.451 kg), SpO2 97.00%.  Labs: Component Value Date   WBC 6.1 10/21/2011   HGB 13.6 10/21/2011   HCT 40.5 10/21/2011   MCV 89.0 10/21/2011   PLT 165 10/21/2011    Lab 10/21/11 0500  NA 136  K 4.0  CL 102  CO2 29  BUN 13  CREATININE 1.11  CALCIUM 8.7  GLUCOSE 81   Basename 10/20/11 2101 10/20/11 1528 10/20/11 0945  CKTOTAL 52 58 --  CKMB 1.8 1.9 --  TROPONINI <0.30 <0.30 <0.30     10/20/2011 15:28  Hemoglobin A1C 5.7 (H)     10/21/2011 05:00  Prothrombin Time 23.1 (H)  INR 2.01 (H)    Discharge Medications   Medication List  As of 10/21/2011  9:28 AM   STOP taking these medications         furosemide 40 MG tablet         TAKE these medications         ALPRAZolam 1 MG tablet   Commonly known as: XANAX   Take 1 mg by mouth 3 (three) times daily as needed. For anxiety      amLODipine 5 MG tablet   Commonly known as: NORVASC   Take 5 mg by mouth  2 (two) times daily.      atorvastatin 40 MG tablet   Commonly known as: LIPITOR   Take 40 mg by mouth daily.      CALCIUM + D PO   Take 1 tablet by mouth daily.      carvedilol 12.5 MG tablet   Commonly known as: COREG   Take 1 tablet (12.5 mg total) by mouth 2 (two) times daily with a meal.      COLACE PO   Take 1 capsule by mouth daily.      doxepin 25 MG capsule   Commonly known as: SINEQUAN   Take 25 mg by mouth 3 (three) times daily.      fexofenadine 60 MG tablet   Commonly known as: ALLEGRA   Take 60 mg by mouth as needed. For allergy symptoms      gabapentin 100 MG capsule   Commonly known as: NEURONTIN   Take 100 mg by mouth 4 (four) times daily.       isosorbide mononitrate 60 MG 24 hr tablet   Commonly known as: IMDUR   Take 1 tablet (60 mg total) by mouth daily.      lisinopril 5 MG tablet   Commonly known as: PRINIVIL,ZESTRIL   Take 1 tablet (5 mg total) by mouth daily.      multivitamin tablet   Take 1 tablet by mouth daily.      nitroGLYCERIN 0.4 MG SL tablet   Commonly known as: NITROSTAT   Place 0.4 mg under the tongue every 5 (five) minutes as needed. For chest pain      omeprazole 20 MG capsule   Commonly known as: PRILOSEC   Take 1 capsule (20 mg total) by mouth 2 (two) times daily.      OVER THE COUNTER MEDICATION   Place 1 drop into both eyes 4 (four) times daily as needed. For cataracts      sucralfate 1 G tablet   Commonly known as: CARAFATE   Take 1 g by mouth as needed. For stomach ulcers      warfarin 5 MG tablet   Commonly known as: COUMADIN   Take 2.5 mg by mouth daily. 1/2 (2.5 mg) tab on Monday, Wednesday, and Friday all other days pt takes 1 tablet (5 mg)            Disposition   Discharge Orders    Future Appointments: Provider: Department: Dept Phone: Center:   10/28/2011 10:45 AM Cassell Clement, MD [facility] [phone] None     Future Orders Please Complete By Expires   Diet - low sodium heart healthy      Increase activity slowly      Discharge instructions      Comments:   **PLEASE REMEMBER TO BRING ALL OF YOUR MEDICATIONS TO EACH OF YOUR FOLLOW-UP OFFICE VISITS.        Outstanding Labs/Studies:  1. Consider BMET as he was initiated on ACEI this admission  Duration of Discharge Encounter: Greater than 30 minutes including physician and PA time.  Signed, HOPE, JESSICA PA-C 10/21/2011, 9:28 AM  Patient seen and examined.  Plan as discussed in my rounding note for today and outlined above. [name]  10/21/2011  8:34 PM

## 2011-10-25 ENCOUNTER — Telehealth: Payer: Self-pay | Admitting: Cardiology

## 2011-10-25 NOTE — Telephone Encounter (Signed)
Pt advised to leave off new RX of lisinopril from hospital visit until he sees Dr. Patty Sermons on Friday.  Pt agrees.

## 2011-10-25 NOTE — Telephone Encounter (Signed)
Pt is resting and he has started a new b/p meds and he wants to make sure it is not too low in his right arm 95/50 pulse was 54 and left arm 97/50 pulse was 65

## 2011-10-25 NOTE — Telephone Encounter (Signed)
Pt informed of appt with Dr. Patty Sermons on 6/28.

## 2011-10-25 NOTE — Telephone Encounter (Signed)
New msg Pt wants to talk to a nurse.  Wouldn't tell my why Please call

## 2011-10-28 ENCOUNTER — Ambulatory Visit (INDEPENDENT_AMBULATORY_CARE_PROVIDER_SITE_OTHER): Payer: Medicare Other | Admitting: Cardiology

## 2011-10-28 ENCOUNTER — Encounter: Payer: Self-pay | Admitting: Cardiology

## 2011-10-28 VITALS — BP 100/68 | HR 70 | Ht 71.0 in | Wt 193.0 lb

## 2011-10-28 DIAGNOSIS — I255 Ischemic cardiomyopathy: Secondary | ICD-10-CM

## 2011-10-28 DIAGNOSIS — I959 Hypotension, unspecified: Secondary | ICD-10-CM

## 2011-10-28 DIAGNOSIS — I2589 Other forms of chronic ischemic heart disease: Secondary | ICD-10-CM

## 2011-10-28 DIAGNOSIS — Z951 Presence of aortocoronary bypass graft: Secondary | ICD-10-CM

## 2011-10-28 DIAGNOSIS — I10 Essential (primary) hypertension: Secondary | ICD-10-CM

## 2011-10-28 DIAGNOSIS — I2510 Atherosclerotic heart disease of native coronary artery without angina pectoris: Secondary | ICD-10-CM

## 2011-10-28 DIAGNOSIS — E7800 Pure hypercholesterolemia, unspecified: Secondary | ICD-10-CM

## 2011-10-28 LAB — LIPID PANEL
HDL: 34.2 mg/dL — ABNORMAL LOW (ref >39.00)
LDL Cholesterol: 55 mg/dL (ref 0–99)
Total CHOL/HDL Ratio: 3
VLDL: 17.4 mg/dL (ref 0.0–40.0)

## 2011-10-28 LAB — BASIC METABOLIC PANEL
CO2: 29 mEq/L (ref 19–32)
GFR: 62.8 mL/min (ref >60.00)
Glucose, Bld: 100 mg/dL — ABNORMAL HIGH (ref 70–99)
Potassium: 4.2 mEq/L (ref 3.5–5.1)
Sodium: 135 mEq/L (ref 135–145)

## 2011-10-28 LAB — HEPATIC FUNCTION PANEL: Total Bilirubin: 1 mg/dL (ref 0.3–1.2)

## 2011-10-28 NOTE — Patient Instructions (Addendum)
Decrease your Amlodipine to 5 mg once a day  Your physician wants you to follow-up in: 3 months You will receive a reminder letter in the mail two months in advance. If you don't receive a letter, please call our office to schedule the follow-up appointment.

## 2011-10-28 NOTE — Progress Notes (Signed)
Linton Ham Date of Birth:  1941-03-11 [facility] [address] [place], [place]  [address] [phone]         Fax   [phone]  History of Present Illness: This pleasant 71 year old gentleman is seen for a followup office visit.  He has a past history of frequent episodes of chest pain.  His most recent episode was 8 days ago when he was admitted on 10/20/11 and discharged on 10/21/11 after ruling out for myocardial infarction.  He did not require any further ischemic studies.  Enzymes were normal.  He has a pacemaker for AV block.  He has a past history of paroxysmal atrial flutter.  He has known coronary artery disease with coronary artery bypass graft surgery in 1991 and a redo CABG in 2007.  His most recent cardiac catheter was October 2012 showing patent grafts.  Recently the patient has been experiencing some fatigue and his blood pressure has been running lower than normal  Current Outpatient Prescriptions  Medication Sig Dispense Refill  . ALPRAZolam (XANAX) 1 MG tablet Take 1 mg by mouth 3 (three) times daily as needed. For anxiety      . amLODipine (NORVASC) 5 MG tablet Take 5 mg by mouth daily.       [name] Kitchen atorvastatin (LIPITOR) 40 MG tablet Take 40 mg by mouth daily.      . Calcium Carbonate-Vitamin D (CALCIUM + D PO) Take 1 tablet by mouth daily.       . carvedilol (COREG) 12.5 MG tablet Take 1 tablet (12.5 mg total) by mouth 2 (two) times daily with a meal.  180 tablet  3  . Docusate Sodium (COLACE PO) Take 1 capsule by mouth daily.       [name] Kitchen doxepin (SINEQUAN) 25 MG capsule Take 25 mg by mouth 3 (three) times daily.        . fexofenadine (ALLEGRA) 60 MG tablet Take 60 mg by mouth as needed. For allergy symptoms      . gabapentin (NEURONTIN) 100 MG capsule Take 100 mg by mouth 4 (four) times daily.       . isosorbide mononitrate (IMDUR) 60 MG 24 hr tablet Take 1 tablet (60 mg total) by mouth daily.  90 tablet  3  . Multiple Vitamin (MULTIVITAMIN) tablet  Take 1 tablet by mouth daily.        . nitroGLYCERIN (NITROSTAT) 0.4 MG SL tablet Place 0.4 mg under the tongue every 5 (five) minutes as needed. For chest pain      . omeprazole (PRILOSEC) 20 MG capsule Take 1 capsule (20 mg total) by mouth 2 (two) times daily.  180 capsule  3  . OVER THE COUNTER MEDICATION Place 1 drop into both eyes 4 (four) times daily as needed. For cataracts      . sucralfate (CARAFATE) 1 G tablet Take 1 g by mouth as needed. For stomach ulcers      . warfarin (COUMADIN) 5 MG tablet Take 2.5 mg by mouth daily. 1/2 (2.5 mg) tab on Monday, Wednesday, and Friday all other days pt takes 1 tablet (5 mg)        Allergies  Allergen Reactions  . Sulfa Drugs Cross Reactors Other (See Comments)    unknown  . Ultram (Tramadol Hcl) Itching    "don't remember how bad"  . Xarelto (Rivaroxaban) Other (See Comments)    Pt doesn't remember the reaction but physician told to stop taking it.    Patient Active Problem  List  Diagnosis  . Ischemic cardiomyopathy  . Hypertension  . Hyperlipidemia  . GERD (gastroesophageal reflux disease)  . Anxiety  . Fibromyalgia  . Hx of CABG  . Cough, persistent  . Paroxysmal atrial flutter  . Chest pain  . Atrial fibrillation  . Complete heart block-intermittent  . Pacemaker-stJudes  . CAD (coronary artery disease)  . Chest pain    History  Smoking status  . Former Smoker -- 1.0 packs/day for 10 years  . Types: Cigarettes  . Quit date: 09/21/1973  Smokeless tobacco  . Never Used    History  Alcohol Use No    Family History  Problem Relation Age of Onset  . Stroke Mother   . Heart attack Father     Review of Systems: Constitutional: no fever chills diaphoresis or fatigue or change in weight.  Head and neck: no hearing loss, no epistaxis, no photophobia or visual disturbance. Respiratory: No cough, shortness of breath or wheezing. Cardiovascular: No chest pain peripheral edema, palpitations. Gastrointestinal: No  abdominal distention, no abdominal pain, no change in bowel habits hematochezia or melena. Genitourinary: No dysuria, no frequency, no urgency, no nocturia. Musculoskeletal:No arthralgias, no back pain, no gait disturbance or myalgias. Neurological: No dizziness, no headaches, no numbness, no seizures, no syncope, no weakness, no tremors. Hematologic: No lymphadenopathy, no easy bruising. Psychiatric: No confusion, no hallucinations, no sleep disturbance.    Physical Exam: Filed Vitals:   10/28/11 0901  BP: 100/68  Pulse: 70   the general appearance reveals a well-developed anxious gentleman in no distress.  I rechecked his blood pressure and got 90/60.The head and neck exam reveals pupils equal and reactive.  Extraocular movements are full.  There is no scleral icterus.  The mouth and pharynx are normal.  The neck is supple.  The carotids reveal no bruits.  The jugular venous pressure is normal.  The  thyroid is not enlarged.  There is no lymphadenopathy.  The chest is clear to percussion and auscultation.  There are no rales or rhonchi.  Expansion of the chest is symmetrical.  The precordium is quiet.  The first heart sound is normal.  The second heart sound is physiologically split.  There is no murmur gallop rub or click.  There is no abnormal lift or heave.  The abdomen is soft and nontender.  The bowel sounds are normal.  The liver and spleen are not enlarged.  There are no abdominal masses.  There are no abdominal bruits.  Extremities reveal good pedal pulses.  There is no phlebitis or edema.  There is no cyanosis or clubbing.  Strength is normal and symmetrical in all extremities.  There is no lateralizing weakness.  There are no sensory deficits.  The skin is warm and dry.  There is no rash.    Assessment / Plan: Continue same medication except decreased amlodipine to 5 mg daily. Recheck in 3 months for followup office visit

## 2011-10-28 NOTE — Assessment & Plan Note (Signed)
The patient has a blood pressure today setting of only 90/60.  He has been taking amlodipine 5 mg twice a day and we will decrease this to just once a day in the morning to allow higher blood pressure.  The patient has been experiencing fatigue which may be from his low blood pressure.

## 2011-10-28 NOTE — Assessment & Plan Note (Signed)
The patient has borderline cardiomegaly by x-ray.  He is not having any symptoms of congestive heart failure at this time

## 2011-10-28 NOTE — Assessment & Plan Note (Signed)
The patient has done well since discharge from the hospital a week ago.  He has had no recurrent chest pain.  He has not been doing his treadmill for the past week but I want him to start back gradually.

## 2011-10-30 NOTE — Progress Notes (Signed)
Quick Note:  Please report to patient. The recent labs are stable. Continue same medication and careful diet. ______ 

## 2011-10-31 ENCOUNTER — Telehealth: Payer: Self-pay | Admitting: *Deleted

## 2011-10-31 NOTE — Telephone Encounter (Signed)
On 6/25 patient had called in with low blood pressure and was advised to stop Lisinopril 5 mg but only decreased from twice day to once a day.  Blood pressure at office visit was 100/68 and this am systolic 120's.  Patient not sure what he should be taking, will forward to  Dr. Patty Sermons for review

## 2011-10-31 NOTE — Telephone Encounter (Signed)
Advised patient of lab results  

## 2011-10-31 NOTE — Telephone Encounter (Signed)
Current BP is okay on lisinopril 5 mg once a day so continue with that.

## 2011-10-31 NOTE — Telephone Encounter (Signed)
Message copied by Burnell Blanks on Mon Oct 31, 2011 11:52 AM ------      Message from: Cassell Clement      Created: Sun Oct 30, 2011  7:32 PM       Please report to patient.  The recent labs are stable. Continue same medication and careful diet.

## 2011-10-31 NOTE — Telephone Encounter (Signed)
Advised patient and he will call if systolic blood pressure below 454

## 2011-11-07 ENCOUNTER — Telehealth: Payer: Self-pay | Admitting: Cardiology

## 2011-11-07 NOTE — Telephone Encounter (Signed)
New  Problem  Patient calling C/O blood pressure are low. This am 123/65. Before medication only took one pill. Patient states he loss his wife about 3 months.

## 2011-11-07 NOTE — Telephone Encounter (Signed)
Patient called stated he was concerned about his blood pressure.States B/P after taking carvedilol this am 111/61,113/63.States afraid to take amlodipine. States when he took both amlodipine and carvedilol B/P 98/56,96/56. Also states has been having chest pain off and on for 4 days.States has took NTG with relief.Also having choking spells too.Spoke to Norma Fredrickson NP she advised to take amlodipine after dinner and make appointment this week.Appointment scheduled tomorrow 11/08/11 with Lawson Fiscal.

## 2011-11-08 ENCOUNTER — Encounter: Payer: Self-pay | Admitting: Nurse Practitioner

## 2011-11-08 ENCOUNTER — Ambulatory Visit (INDEPENDENT_AMBULATORY_CARE_PROVIDER_SITE_OTHER): Payer: Medicare Other | Admitting: Nurse Practitioner

## 2011-11-08 VITALS — BP 120/80 | HR 70 | Ht 71.0 in | Wt 196.6 lb

## 2011-11-08 DIAGNOSIS — K219 Gastro-esophageal reflux disease without esophagitis: Secondary | ICD-10-CM

## 2011-11-08 DIAGNOSIS — I2510 Atherosclerotic heart disease of native coronary artery without angina pectoris: Secondary | ICD-10-CM

## 2011-11-08 DIAGNOSIS — I10 Essential (primary) hypertension: Secondary | ICD-10-CM

## 2011-11-08 MED ORDER — CARVEDILOL 12.5 MG PO TABS: 6.2500 mg | ORAL_TABLET | Freq: Two times a day (BID) | ORAL | Status: DC

## 2011-11-08 NOTE — Patient Instructions (Addendum)
Stop the amlodipine.  Cut the Coreg back to half (6.25 mg) two times a day  Monitor your blood pressure at home.   You need to see the GI doctor - we will get Juliette Alcide to call and schedule for you.  See Dr. Patty Sermons in 2 weeks for follow up.  Call the [facility] office at [phone] if you have any questions, problems or concerns.

## 2011-11-08 NOTE — Progress Notes (Signed)
[name] Date of Birth: 1941-02-17 Medical Record #[id number]  History of Present Illness: [name] is seen today for a work in visit. He is seen for Dr. Patty Sermons. He has multiple medical issues which include CAD with prior CABG and redo CABG in 2007. Last cath in October of 2012 showed his grafts to be patent. He is managed medically. Does have HTN, HLD, chronic atrial fib on coumadin and underlying pacemaker.   He comes in today. He is quite anxious and has flight of ideas. He admits to having lots of stress/guilt with having his wife placed in a nursing home. She has since passed away. He is having lots of somatic complaints. These include chest pain. He was admitted last month and had a negative evaluation. He notes a choking sensation in his throat. Does have history of prior esophageal stretching and says he has to have that about once a year and it has been 13 months. Also with some diarrhea. Has a hiatal hernia. Back using his Carafate daily.  Uses NTG with no real change. Blood pressure is running low. Norvasc had already been cut back last month.   Current Outpatient Prescriptions on File Prior to Visit  Medication Sig Dispense Refill  . ALPRAZolam (XANAX) 1 MG tablet Take 1 mg by mouth 3 (three) times daily as needed. For anxiety      . amLODipine (NORVASC) 5 MG tablet Take 5 mg by mouth daily.       [name] Kitchen atorvastatin (LIPITOR) 40 MG tablet Take 40 mg by mouth daily.      . Calcium Carbonate-Vitamin D (CALCIUM + D PO) Take 1 tablet by mouth daily.       . carvedilol (COREG) 12.5 MG tablet Take 1 tablet (12.5 mg total) by mouth 2 (two) times daily with a meal.  180 tablet  3  . Docusate Sodium (COLACE PO) Take 1 capsule by mouth daily.       [name] Kitchen doxepin (SINEQUAN) 25 MG capsule Take 25 mg by mouth 3 (three) times daily.        . fexofenadine (ALLEGRA) 60 MG tablet Take 60 mg by mouth as needed. For allergy symptoms      . gabapentin (NEURONTIN) 100 MG capsule Take 100 mg by  mouth 4 (four) times daily.       . isosorbide mononitrate (IMDUR) 60 MG 24 hr tablet Take 1 tablet (60 mg total) by mouth daily.  90 tablet  3  . Multiple Vitamin (MULTIVITAMIN) tablet Take 1 tablet by mouth daily.        . nitroGLYCERIN (NITROSTAT) 0.4 MG SL tablet Place 0.4 mg under the tongue every 5 (five) minutes as needed. For chest pain      . omeprazole (PRILOSEC) 20 MG capsule Take 1 capsule (20 mg total) by mouth 2 (two) times daily.  180 capsule  3  . OVER THE COUNTER MEDICATION Place 1 drop into both eyes 4 (four) times daily as needed. For cataracts      . sucralfate (CARAFATE) 1 G tablet Take 1 g by mouth as needed. For stomach ulcers      . warfarin (COUMADIN) 5 MG tablet Take 2.5 mg by mouth daily. 1/2 (2.5 mg) tab on Monday, Wednesday, and Friday all other days pt takes 1 tablet (5 mg)        Allergies  Allergen Reactions  . Sulfa Drugs Cross Reactors Other (See Comments)    unknown  . Ultram (Tramadol Hcl) Itching    "  don't remember how bad"  . Xarelto (Rivaroxaban) Other (See Comments)    Pt doesn't remember the reaction but physician told to stop taking it.    Past Medical History  Diagnosis Date  . Ischemic cardiomyopathy     EF 40% by heart cath 01/2011   . Hypertension   . Hyperlipidemia   . GERD (gastroesophageal reflux disease)   . Anxiety   . CAD (coronary artery disease)     s/p CABGx4 in 1991, with redo surgery in 2007 with  SVG to an OM and sequential SVG to the PDA and PLV, recent cath 01/2011 with patent grafts  . Atrial flutter October 2012    S/P ablation - not successful  . CHB (complete heart block) October 2012    S/P PTVP following ablation  . Kidney cysts     CYST ON BOTH KIDNEYS  . Hiatal hernia   . Fibromyalgia   . Depression   . Other "heavy-for-dates" infants elevated PSA level  . A-fib   . Pacemaker   . Anginal pain   . Elevated PSA, less than 10 ng/ml 10/2011    "went from 5 to 9"    Past Surgical History  Procedure Date  .  Coronary artery bypass graft 1991    CABG X 5  . Coronary artery bypass graft 06/2005    CABG X 3  . Cardiac catheterization 10/30/2009    Grafts patent. Mild to moderate LV dysfunction. EF 45%. Managed medically.   . Cardiac catheterization     multiple  . Tonsillectomy 1960  . Cholecystectomy 2007  . Insert / replace / remove pacemaker 2012  . Inguinal hernia repair 1960's    "left I think"  . Cataract extraction w/ intraocular lens  implant, bilateral 2012    History  Smoking status  . Former Smoker -- 1.0 packs/day for 10 years  . Types: Cigarettes  . Quit date: 09/21/1973  Smokeless tobacco  . Never Used    History  Alcohol Use No    Family History  Problem Relation Age of Onset  . Stroke Mother   . Heart attack Father     Review of Systems: The review of systems is per the HPI.  All other systems were reviewed and are negative.  Physical Exam: BP 102/64  Pulse 70  Ht 5\' 11"  (1.803 m)  Wt 196 lb 9.6 oz (89.177 kg)  BMI 27.42 kg/m2 Patient is quite anxious but in no acute distress. Skin is warm and dry. Color is normal.  HEENT is unremarkable. Normocephalic/atraumatic. PERRL. Sclera are nonicteric. Neck is supple. No masses. No JVD. Lungs are clear. Cardiac exam shows a regular rate and rhythm. Abdomen is soft. Extremities are without edema. Gait and ROM are intact. No gross neurologic deficits noted.  LABORATORY DATA: EKG shows a paced rhythm.   Lab Results  Component Value Date   WBC 6.1 10/21/2011   HGB 13.6 10/21/2011   HCT 40.5 10/21/2011   PLT 165 10/21/2011   GLUCOSE 100* 10/28/2011   CHOL 107 10/28/2011   TRIG 87.0 10/28/2011   HDL 34.20* 10/28/2011   LDLDIRECT 77.3 12/20/2010   LDLCALC 55 10/28/2011   ALT 17 10/28/2011   AST 15 10/28/2011   NA 135 10/28/2011   K 4.2 10/28/2011   CL 102 10/28/2011   CREATININE 1.2 10/28/2011   BUN 15 10/28/2011   CO2 29 10/28/2011   TSH 2.495 02/10/2011   INR 2.01* 10/21/2011   HGBA1C 5.7* 10/20/2011  Assessment /  Plan:

## 2011-11-08 NOTE — Assessment & Plan Note (Signed)
He is having a multitude of complaints. I think a lot of this is anxiety driven. He has had a recent admission for chest pain. Last cath was in October of 2012 and his grafts were patent. Will continue with his Imdur. Will send him to GI. We will see him back in 3 weeks. Patient is agreeable to this plan and will call if any problems develop in the interim.

## 2011-11-08 NOTE — Assessment & Plan Note (Signed)
He has known reflux with prior need for esophageal stretching. Will arrange for him to see his GI doctor in [place].

## 2011-11-08 NOTE — Assessment & Plan Note (Signed)
Blood pressure is running low. I have stopped the Norvasc and cut the Coreg in half. He will continue to monitor at home.

## 2011-11-09 ENCOUNTER — Telehealth: Payer: Self-pay | Admitting: Cardiology

## 2011-11-09 NOTE — Telephone Encounter (Signed)
Advised patient

## 2011-11-09 NOTE — Telephone Encounter (Signed)
Pt has questions BP medication, pls call [phone]

## 2011-11-09 NOTE — Telephone Encounter (Signed)
This am when patient got up his blood pressure 115/60 so he did not take Carvedilol, when he got home it was 127/70.  He went ahead and took his 1/2 Carvedilol.  Not sure how low before he should hold.  Will forward to  Dr. Patty Sermons

## 2011-11-09 NOTE — Telephone Encounter (Signed)
Take carvedilol as long as BP> 115

## 2011-11-18 ENCOUNTER — Telehealth: Payer: Self-pay | Admitting: Cardiology

## 2011-11-18 NOTE — Telephone Encounter (Signed)
Pt calling re BP , 128/70 this am, then 105/56 , pls call [phone] to advise

## 2011-11-18 NOTE — Telephone Encounter (Signed)
Left message to call back  

## 2011-11-21 ENCOUNTER — Encounter: Payer: Self-pay | Admitting: Cardiology

## 2011-11-21 ENCOUNTER — Ambulatory Visit (INDEPENDENT_AMBULATORY_CARE_PROVIDER_SITE_OTHER): Payer: Medicare Other | Admitting: Cardiology

## 2011-11-21 VITALS — BP 130/80 | HR 70 | Ht 71.0 in | Wt 190.0 lb

## 2011-11-21 DIAGNOSIS — I10 Essential (primary) hypertension: Secondary | ICD-10-CM

## 2011-11-21 DIAGNOSIS — E7800 Pure hypercholesterolemia, unspecified: Secondary | ICD-10-CM

## 2011-11-21 DIAGNOSIS — I119 Hypertensive heart disease without heart failure: Secondary | ICD-10-CM

## 2011-11-21 DIAGNOSIS — Z951 Presence of aortocoronary bypass graft: Secondary | ICD-10-CM

## 2011-11-21 DIAGNOSIS — K219 Gastro-esophageal reflux disease without esophagitis: Secondary | ICD-10-CM

## 2011-11-21 NOTE — Telephone Encounter (Signed)
Patient never called back.  Here now for office visit with  Dr. Patty Sermons

## 2011-11-21 NOTE — Assessment & Plan Note (Signed)
The patient is very anxious about his blood pressure.  Generally his blood pressure readings have been stable.  Today on arrival his initial diastolic was 88 but subsequently during the examination we rechecked it and it was down to 80.

## 2011-11-21 NOTE — Assessment & Plan Note (Signed)
The patient has not been having any recent severe chest pain.  He tries to get exercise on his treadmill each day.

## 2011-11-21 NOTE — Assessment & Plan Note (Signed)
The patient has a history of GERD.  He is followed by Dr. Noelle Penner at [facility] and the patient is going to have an endoscopy there on July 24.  He expects that he may have to have another esophageal dilatation.  I advised the patient to leave off his Coumadin the night before the procedure.  He is maintaining normal sinus rhythm and has an underlying pacemaker

## 2011-11-21 NOTE — Patient Instructions (Signed)
Your physician recommends that you continue on your current medications as directed. Please refer to the Current Medication list given to you today  Your physician recommends that you schedule a follow-up appointment in: 3 months with fasting labs (LP/BMET/HFP)  

## 2011-11-21 NOTE — Progress Notes (Signed)
[name] Date of Birth:  [birth year] [facility] [address] [place], [place]  [address] [phone]         Fax   [phone]  History of Present Illness: This pleasant 71 year old gentleman is seen for a scheduled followup office visit.  The patient has a history of multiple medical issues.  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  His last cardiac catheterization in October 2012 showed that his grafts were patent.  The patient also has a history of hypercholesterolemia and essential hypertension.  He has had a prior history of paroxysmal atrial flutter and he has an underlying pacemaker and is on long-term Coumadin.  He has a lot of anxiety.  He is a recent widower.  Current Outpatient Prescriptions  Medication Sig Dispense Refill  . ALPRAZolam (XANAX) 1 MG tablet Take 1 mg by mouth 3 (three) times daily as needed. For anxiety      . atorvastatin (LIPITOR) 40 MG tablet Take 40 mg by mouth daily.      . Calcium Carbonate-Vitamin D (CALCIUM + D PO) Take 1 tablet by mouth daily.       . carvedilol (COREG) 12.5 MG tablet Take 0.5 tablets (6.25 mg total) by mouth 2 (two) times daily with a meal.  180 tablet  3  . Docusate Sodium (COLACE PO) Take 1 capsule by mouth daily.       [name] Kitchen doxepin (SINEQUAN) 25 MG capsule Take 25 mg by mouth 3 (three) times daily.        . fexofenadine (ALLEGRA) 60 MG tablet Take 60 mg by mouth as needed. For allergy symptoms      . gabapentin (NEURONTIN) 100 MG capsule Take 100 mg by mouth 4 (four) times daily.       . isosorbide mononitrate (IMDUR) 60 MG 24 hr tablet Take 1 tablet (60 mg total) by mouth daily.  90 tablet  3  . Multiple Vitamin (MULTIVITAMIN) tablet Take 1 tablet by mouth daily.        . nitroGLYCERIN (NITROSTAT) 0.4 MG SL tablet Place 0.4 mg under the tongue every 5 (five) minutes as needed. For chest pain      . omeprazole (PRILOSEC) 20 MG capsule Take 1 capsule (20 mg total) by mouth 2 (two)  times daily.  180 capsule  3  . OVER THE COUNTER MEDICATION Place 1 drop into both eyes 4 (four) times daily as needed. For cataracts      . sucralfate (CARAFATE) 1 G tablet Take 1 g by mouth as needed. For stomach ulcers      . warfarin (COUMADIN) 5 MG tablet Take 2.5 mg by mouth daily. 1/2 (2.5 mg) tab on Monday, Wednesday, and Friday all other days pt takes 1 tablet (5 mg)        Allergies  Allergen Reactions  . Sulfa Drugs Cross Reactors Other (See Comments)    unknown  . Ultram (Tramadol Hcl) Itching    "don't remember how bad"  . Xarelto (Rivaroxaban) Other (See Comments)    Pt doesn't remember the reaction but physician told to stop taking it.    Patient Active Problem List  Diagnosis  . Ischemic cardiomyopathy  . Hypertension  . Hyperlipidemia  . GERD (gastroesophageal reflux disease)  . Anxiety  . Fibromyalgia  . Hx of CABG  . Cough, persistent  . Paroxysmal atrial flutter  . Chest pain  . Atrial fibrillation  . Complete  heart block-intermittent  . Pacemaker-stJudes  . CAD (coronary artery disease)  . Chest pain    History  Smoking status  . Former Smoker -- 1.0 packs/day for 10 years  . Types: Cigarettes  . Quit date: 09/21/1973  Smokeless tobacco  . Never Used    History  Alcohol Use No    Family History  Problem Relation Age of Onset  . Stroke Mother   . Heart attack Father     Review of Systems: Constitutional: no fever chills diaphoresis or fatigue or change in weight.  Head and neck: no hearing loss, no epistaxis, no photophobia or visual disturbance. Respiratory: No cough, shortness of breath or wheezing. Cardiovascular: No chest pain peripheral edema, palpitations. Gastrointestinal: No abdominal distention, no abdominal pain, no change in bowel habits hematochezia or melena. Genitourinary: No dysuria, no frequency, no urgency, no nocturia. Musculoskeletal:No arthralgias, no back pain, no gait disturbance or myalgias. Neurological: No  dizziness, no headaches, no numbness, no seizures, no syncope, no weakness, no tremors. Hematologic: No lymphadenopathy, no easy bruising. Psychiatric: No confusion, no hallucinations, no sleep disturbance.    Physical Exam: Filed Vitals:   11/21/11 1109  BP:   Pulse: 70   the general appearance reveals an anxious elderly gentleman in no distress.The head and neck exam reveals pupils equal and reactive.  Extraocular movements are full.  There is no scleral icterus.  The mouth and pharynx are normal.  The neck is supple.  The carotids reveal no bruits.  The jugular venous pressure is normal.  The  thyroid is not enlarged.  There is no lymphadenopathy.  The chest is clear to percussion and auscultation.  There are no rales or rhonchi.  Expansion of the chest is symmetrical.  The precordium is quiet.  The first heart sound is normal.  The second heart sound is physiologically split.  There is no murmur gallop rub or click.  There is no abnormal lift or heave.  The abdomen is soft and nontender.  The bowel sounds are normal.  The liver and spleen are not enlarged.  There are no abdominal masses.  There are no abdominal bruits.  Extremities reveal good pedal pulses.  There is no phlebitis or edema.  There is no cyanosis or clubbing.  Strength is normal and symmetrical in all extremities.  There is no lateralizing weakness.  There are no sensory deficits.  The skin is warm and dry.  There is no rash.  His weight is down 6 pounds since last visit.    Assessment / Plan: Continue same medication.  I reassured him about his blood pressure.  Some day-to-day variation in his blood pressure is to be expected and he should not stress over it too much.  Recheck in 3 months for followup office visit and fasting lab work

## 2011-11-23 ENCOUNTER — Ambulatory Visit: Payer: Medicare Other | Admitting: Cardiology

## 2011-12-08 ENCOUNTER — Encounter (HOSPITAL_COMMUNITY): Payer: Self-pay | Admitting: Emergency Medicine

## 2011-12-08 ENCOUNTER — Emergency Department (HOSPITAL_COMMUNITY): Payer: Medicare Other

## 2011-12-08 ENCOUNTER — Observation Stay (HOSPITAL_COMMUNITY)
Admission: EM | Admit: 2011-12-08 | Discharge: 2011-12-10 | Disposition: A | Discharge status: Home / Self Care | Payer: Medicare Other | Attending: Internal Medicine | Admitting: Internal Medicine

## 2011-12-08 DIAGNOSIS — I442 Atrioventricular block, complete: Secondary | ICD-10-CM

## 2011-12-08 DIAGNOSIS — Z95810 Presence of automatic (implantable) cardiac defibrillator: Secondary | ICD-10-CM | POA: Diagnosis present

## 2011-12-08 DIAGNOSIS — I4892 Unspecified atrial flutter: Secondary | ICD-10-CM

## 2011-12-08 DIAGNOSIS — I119 Hypertensive heart disease without heart failure: Secondary | ICD-10-CM

## 2011-12-08 DIAGNOSIS — Z01810 Encounter for preprocedural cardiovascular examination: Secondary | ICD-10-CM

## 2011-12-08 DIAGNOSIS — K219 Gastro-esophageal reflux disease without esophagitis: Secondary | ICD-10-CM

## 2011-12-08 DIAGNOSIS — I4891 Unspecified atrial fibrillation: Secondary | ICD-10-CM

## 2011-12-08 DIAGNOSIS — Z951 Presence of aortocoronary bypass graft: Secondary | ICD-10-CM

## 2011-12-08 DIAGNOSIS — M797 Fibromyalgia: Secondary | ICD-10-CM

## 2011-12-08 DIAGNOSIS — I10 Essential (primary) hypertension: Secondary | ICD-10-CM

## 2011-12-08 DIAGNOSIS — E785 Hyperlipidemia, unspecified: Secondary | ICD-10-CM

## 2011-12-08 DIAGNOSIS — I2589 Other forms of chronic ischemic heart disease: Secondary | ICD-10-CM | POA: Insufficient documentation

## 2011-12-08 DIAGNOSIS — R05 Cough: Secondary | ICD-10-CM

## 2011-12-08 DIAGNOSIS — I255 Ischemic cardiomyopathy: Secondary | ICD-10-CM

## 2011-12-08 DIAGNOSIS — Z7901 Long term (current) use of anticoagulants: Secondary | ICD-10-CM | POA: Insufficient documentation

## 2011-12-08 DIAGNOSIS — R079 Chest pain, unspecified: Principal | ICD-10-CM

## 2011-12-08 DIAGNOSIS — R053 Chronic cough: Secondary | ICD-10-CM

## 2011-12-08 DIAGNOSIS — I2510 Atherosclerotic heart disease of native coronary artery without angina pectoris: Secondary | ICD-10-CM

## 2011-12-08 DIAGNOSIS — Z950 Presence of cardiac pacemaker: Secondary | ICD-10-CM

## 2011-12-08 DIAGNOSIS — F419 Anxiety disorder, unspecified: Secondary | ICD-10-CM

## 2011-12-08 LAB — BASIC METABOLIC PANEL
BUN: 10 mg/dL (ref 6–23)
Chloride: 97 mEq/L (ref 96–112)
Creatinine, Ser: 1.07 mg/dL (ref 0.50–1.35)
GFR calc Af Amer: 79 mL/min — ABNORMAL LOW (ref >90)
Glucose, Bld: 100 mg/dL — ABNORMAL HIGH (ref 70–99)

## 2011-12-08 LAB — CBC WITH DIFFERENTIAL/PLATELET
Basophils Relative: 0 % (ref 0–1)
HCT: 40.7 % (ref 39.0–52.0)
Hemoglobin: 13.6 g/dL (ref 13.0–17.0)
Lymphs Abs: 2 10*3/uL (ref 0.7–4.0)
MCH: 30 pg (ref 26.0–34.0)
MCHC: 33.4 g/dL (ref 30.0–36.0)
Monocytes Absolute: 0.5 10*3/uL (ref 0.1–1.0)
Monocytes Relative: 7 % (ref 3–12)
Neutro Abs: 4.6 10*3/uL (ref 1.7–7.7)

## 2011-12-08 MED ORDER — MORPHINE SULFATE 4 MG/ML IJ SOLN
4.0000 mg | Freq: Once | INTRAMUSCULAR | Status: AC
  Administered 2011-12-08: 4 mg via INTRAVENOUS
  Filled 2011-12-08: qty 1

## 2011-12-08 MED ORDER — NITROGLYCERIN 0.4 MG SL SUBL
0.4000 mg | SUBLINGUAL_TABLET | SUBLINGUAL | Status: AC | PRN
  Administered 2011-12-08 (×3): 0.4 mg via SUBLINGUAL
  Filled 2011-12-08: qty 75

## 2011-12-08 MED ORDER — ASPIRIN 81 MG PO CHEW: 324.0000 mg | CHEWABLE_TABLET | Freq: Once | ORAL | Status: DC

## 2011-12-08 NOTE — ED Notes (Signed)
PT. ARRIVED WITH EMS FROM HOME REPORTS MID CHEST PAIN WITH SLIGHT SOB ONSET THIS AFTERNOON , PT. TOOK 4 BABY ASA AND 3 NTG SL PTA WITH NO RELIEF. DENIES NAUSEA OR DIAPHORESIS.  PT. HAS HISTORY OF CAD , CABG AND PACEMAKER.

## 2011-12-08 NOTE — ED Provider Notes (Signed)
History     CSN: [id number]  Arrival date & time 12/08/11  2039   First MD Initiated Contact with Patient 12/08/11 2040      Chief Complaint  Patient presents with  . Chest Pain    (Consider location/radiation/quality/duration/timing/severity/associated sxs/prior treatment) Patient is a 71 y.o. male presenting with chest pain. The history is provided by the patient and the EMS personnel.  Chest Pain The chest pain began 3 - 5 hours ago. Episode frequency: initially intermittent, now constant. The chest pain is unchanged. Associated with: nothing. The pain is currently at 8/10. The severity of the pain is moderate. The quality of the pain is described as similar to previous episodes, heavy and dull. The pain does not radiate. Exacerbated by: nothing. Pertinent negatives for primary symptoms include no fever, no shortness of breath, no cough, no abdominal pain, no nausea and no vomiting.  Pertinent negatives for associated symptoms include no lower extremity edema, no numbness and no weakness. He tried nitroglycerin and aspirin (and maalox) for the symptoms.     Past Medical History  Diagnosis Date  . Ischemic cardiomyopathy     EF 40% by heart cath 01/2011   . Hypertension   . Hyperlipidemia   . GERD (gastroesophageal reflux disease)   . Anxiety   . CAD (coronary artery disease)     s/p CABGx4 in 1991, with redo surgery in 2007 with  SVG to an OM and sequential SVG to the PDA and PLV, recent cath 01/2011 with patent grafts  . Atrial flutter October 2012    S/P ablation - not successful  . CHB (complete heart block) October 2012    S/P PTVP following ablation  . Kidney cysts     CYST ON BOTH KIDNEYS  . Hiatal hernia   . Fibromyalgia   . Depression   . Other "heavy-for-dates" infants elevated PSA level  . A-fib   . Pacemaker   . Anginal pain   . Elevated PSA, less than 10 ng/ml 10/2011    "went from 5 to 9"    Past Surgical History  Procedure Date  . Coronary artery  bypass graft 1991    CABG X 5  . Coronary artery bypass graft 06/2005    CABG X 3  . Cardiac catheterization 10/30/2009    Grafts patent. Mild to moderate LV dysfunction. EF 45%. Managed medically.   . Cardiac catheterization     multiple  . Tonsillectomy 1960  . Cholecystectomy 2007  . Insert / replace / remove pacemaker 2012  . Inguinal hernia repair 1960's    "left I think"  . Cataract extraction w/ intraocular lens  implant, bilateral 2012    Family History  Problem Relation Age of Onset  . Stroke Mother   . Heart attack Father     History  Substance Use Topics  . Smoking status: Former Smoker -- 1.0 packs/day for 10 years    Types: Cigarettes    Quit date: 09/21/1973  . Smokeless tobacco: Never Used  . Alcohol Use: No      Review of Systems  Constitutional: Negative for fever and chills.  HENT: Negative for neck pain.   Eyes: Negative.   Respiratory: Negative for cough and shortness of breath.   Cardiovascular: Positive for chest pain. Negative for leg swelling.  Gastrointestinal: Negative for nausea, vomiting, abdominal pain, constipation and blood in stool.  Genitourinary: Negative.   Musculoskeletal: Negative for back pain.  Skin: Negative.   Neurological: Negative for weakness,  numbness and headaches.  Psychiatric/Behavioral: Negative.   All other systems reviewed and are negative.    Allergies  Sulfa drugs cross reactors; Ultram; and Xarelto  Home Medications   Current Outpatient Rx  Name Route Sig Dispense Refill  . ALPRAZOLAM 1 MG PO TABS Oral Take 1 mg by mouth 3 (three) times daily as needed. For anxiety    . ATORVASTATIN CALCIUM 40 MG PO TABS Oral Take 40 mg by mouth daily.    [name] Kitchen CALCIUM + D PO Oral Take 1 tablet by mouth daily.     [name] Kitchen CARVEDILOL 12.5 MG PO TABS Oral Take 0.5 tablets (6.25 mg total) by mouth 2 (two) times daily with a meal. 180 tablet 3  . COLACE PO Oral Take 1 capsule by mouth daily.     [name] Kitchen DOXEPIN HCL 25 MG PO CAPS Oral Take 25  mg by mouth 3 (three) times daily.      [name] Kitchen FEXOFENADINE HCL 60 MG PO TABS Oral Take 60 mg by mouth as needed. For allergy symptoms    . GABAPENTIN 100 MG PO CAPS Oral Take 100 mg by mouth 4 (four) times daily.     . ISOSORBIDE MONONITRATE ER 60 MG PO TB24 Oral Take 1 tablet (60 mg total) by mouth daily. 90 tablet 3  . ONE-DAILY MULTI VITAMINS PO TABS Oral Take 1 tablet by mouth daily.      [name] Kitchen NITROGLYCERIN 0.4 MG SL SUBL Sublingual Place 0.4 mg under the tongue every 5 (five) minutes as needed. For chest pain    . OMEPRAZOLE 20 MG PO CPDR Oral Take 1 capsule (20 mg total) by mouth 2 (two) times daily. 180 capsule 3  . OVER THE COUNTER MEDICATION Both Eyes Place 1 drop into both eyes 4 (four) times daily as needed. For cataracts    . SUCRALFATE 1 G PO TABS Oral Take 1 g by mouth as needed. For stomach ulcers    . WARFARIN SODIUM 5 MG PO TABS Oral Take 2.5 mg by mouth daily. 1/2 (2.5 mg) tab on Monday, Wednesday, and Friday all other days pt takes 1 tablet (5 mg)      BP 146/81  Pulse 69  Resp 20  SpO2 100%  Physical Exam  Nursing note and vitals reviewed. Constitutional: He is oriented to person, place, and time. He appears well-developed and well-nourished.  HENT:  Head: Normocephalic and atraumatic.  Right Ear: External ear normal.  Left Ear: External ear normal.  Nose: Nose normal.  Mouth/Throat: Oropharynx is clear and moist.  Neck: Neck supple.  Cardiovascular: Normal rate, regular rhythm, normal heart sounds and intact distal pulses.   Pulmonary/Chest: Effort normal. He has rales in the right lower field and the left lower field.  Abdominal: Soft. He exhibits no distension. There is no tenderness.  Musculoskeletal: He exhibits no edema and no tenderness.  Lymphadenopathy:    He has no cervical adenopathy.  Neurological: He is alert and oriented to person, place, and time.  Skin: Skin is warm and dry.    ED Course  Procedures (including critical care time)  Labs Reviewed    BASIC METABOLIC PANEL - Abnormal; Notable for the following:    Sodium 132 (*)     Glucose, Bld 100 (*)     GFR calc non Af Amer 68 (*)     GFR calc Af Amer 79 (*)     All other components within normal limits  PROTIME-INR - Abnormal; Notable for the following:  Prothrombin Time 18.4 (*)     INR 1.50 (*)     All other components within normal limits  CBC WITH DIFFERENTIAL  POCT I-STAT TROPONIN I  TROPONIN I  PRO B NATRIURETIC PEPTIDE   Dg Chest 2 View  12/08/2011  *RADIOLOGY REPORT*  Clinical Data: Chest pain, weakness  CHEST - 2 VIEW  Comparison: 10/20/2011  Findings: Coronary bypass changes and left subclavian pacer noted. Stable heart size and vascularity.  Negative for CHF or pneumonia. No effusion or pneumothorax.  Trachea is midline.  Atherosclerosis of the aorta.  IMPRESSION: Stable chest exam.  No superimposed acute process  Original Report Authenticated By: Judie Petit. Ruel Favors, M.D.     Date: 12/08/2011  Rate: 68  Rhythm: normal sinus rhythm  QRS Axis: left  Intervals: normal  ST/T Wave abnormalities: normal  Conduction Disutrbances:none  Narrative Interpretation:   Old EKG Reviewed: unchanged   1. Chest pain       MDM  71 yo male with coronary disease with acute chest pain (heavy, dull) x 3 hours. Now constant. Was sitting in chair when it started. Given ASA, NTG. No change with nitro, improved with morphine. Trop negative. CABG grafts clean on cath in 2012, does not seem like ACS. Is still high risk, will admit to hospitalist for chest pain workup and serial enzymes.        Pricilla Loveless, MD 12/09/11 [phone]

## 2011-12-09 DIAGNOSIS — R079 Chest pain, unspecified: Principal | ICD-10-CM

## 2011-12-09 DIAGNOSIS — I2510 Atherosclerotic heart disease of native coronary artery without angina pectoris: Secondary | ICD-10-CM

## 2011-12-09 DIAGNOSIS — K219 Gastro-esophageal reflux disease without esophagitis: Secondary | ICD-10-CM

## 2011-12-09 DIAGNOSIS — Z950 Presence of cardiac pacemaker: Secondary | ICD-10-CM

## 2011-12-09 LAB — CARDIAC PANEL(CRET KIN+CKTOT+MB+TROPI)
CK, MB: 2.6 ng/mL (ref 0.3–4.0)
Relative Index: INVALID (ref 0.0–2.5)
Relative Index: INVALID (ref 0.0–2.5)
Total CK: 69 U/L (ref 7–232)
Troponin I: <0.3 ng/mL (ref <0.30)
Troponin I: <0.3 ng/mL (ref <0.30)

## 2011-12-09 LAB — CBC
Hemoglobin: 13.6 g/dL (ref 13.0–17.0)
MCH: 29.5 pg (ref 26.0–34.0)
MCHC: 33.2 g/dL (ref 30.0–36.0)
MCV: 88.9 fL (ref 78.0–100.0)
RBC: 4.61 MIL/uL (ref 4.22–5.81)

## 2011-12-09 LAB — BASIC METABOLIC PANEL
BUN: 9 mg/dL (ref 6–23)
CO2: 27 mEq/L (ref 19–32)
GFR calc non Af Amer: 81 mL/min — ABNORMAL LOW (ref >90)
Glucose, Bld: 94 mg/dL (ref 70–99)
Potassium: 3.9 mEq/L (ref 3.5–5.1)

## 2011-12-09 LAB — TSH: TSH: 3.863 u[IU]/mL (ref 0.350–4.500)

## 2011-12-09 MED ORDER — ASPIRIN EC 325 MG PO TBEC
325.0000 mg | DELAYED_RELEASE_TABLET | Freq: Every day | ORAL | Status: DC
  Administered 2011-12-09: 325 mg via ORAL
  Filled 2011-12-09: qty 1

## 2011-12-09 MED ORDER — SODIUM CHLORIDE 0.9 % IJ SOLN: 3.0000 mL | INTRAMUSCULAR | Status: DC | PRN

## 2011-12-09 MED ORDER — NITROGLYCERIN 2 % TD OINT
1.0000 [in_us] | TOPICAL_OINTMENT | Freq: Once | TRANSDERMAL | Status: AC
  Administered 2011-12-09: 1 [in_us] via TOPICAL
  Filled 2011-12-09: qty 1

## 2011-12-09 MED ORDER — NITROGLYCERIN 0.4 MG SL SUBL: 0.4000 mg | SUBLINGUAL_TABLET | SUBLINGUAL | Status: DC | PRN

## 2011-12-09 MED ORDER — CARVEDILOL 6.25 MG PO TABS
6.2500 mg | ORAL_TABLET | Freq: Two times a day (BID) | ORAL | Status: DC
  Administered 2011-12-09 – 2011-12-10 (×3): 6.25 mg via ORAL
  Filled 2011-12-09 (×5): qty 1

## 2011-12-09 MED ORDER — CIPROFLOXACIN HCL 500 MG PO TABS
500.0000 mg | ORAL_TABLET | Freq: Two times a day (BID) | ORAL | Status: DC
  Administered 2011-12-09 – 2011-12-10 (×3): 500 mg via ORAL
  Filled 2011-12-09 (×5): qty 1

## 2011-12-09 MED ORDER — LORATADINE 10 MG PO TABS
10.0000 mg | ORAL_TABLET | Freq: Every day | ORAL | Status: DC
  Administered 2011-12-09 – 2011-12-10 (×2): 10 mg via ORAL
  Filled 2011-12-09 (×2): qty 1

## 2011-12-09 MED ORDER — ATORVASTATIN CALCIUM 40 MG PO TABS
40.0000 mg | ORAL_TABLET | Freq: Every day | ORAL | Status: DC
  Administered 2011-12-09: 40 mg via ORAL
  Filled 2011-12-09 (×3): qty 1

## 2011-12-09 MED ORDER — MORPHINE SULFATE 2 MG/ML IJ SOLN
2.0000 mg | INTRAMUSCULAR | Status: DC | PRN
  Administered 2011-12-09: 2 mg via INTRAVENOUS
  Filled 2011-12-09: qty 1

## 2011-12-09 MED ORDER — SODIUM CHLORIDE 0.9 % IJ SOLN
3.0000 mL | Freq: Two times a day (BID) | INTRAMUSCULAR | Status: DC
  Administered 2011-12-09 (×3): 3 mL via INTRAVENOUS

## 2011-12-09 MED ORDER — ISOSORBIDE MONONITRATE ER 60 MG PO TB24
60.0000 mg | ORAL_TABLET | Freq: Every day | ORAL | Status: DC
  Administered 2011-12-09 – 2011-12-10 (×2): 60 mg via ORAL
  Filled 2011-12-09 (×2): qty 1

## 2011-12-09 MED ORDER — ONE-DAILY MULTI VITAMINS PO TABS: 1.0000 | ORAL_TABLET | Freq: Every day | ORAL | Status: DC

## 2011-12-09 MED ORDER — ALUM & MAG HYDROXIDE-SIMETH 200-200-20 MG/5ML PO SUSP
15.0000 mL | ORAL | Status: DC | PRN
  Administered 2011-12-09 – 2011-12-10 (×3): 15 mL via ORAL
  Filled 2011-12-09 (×3): qty 30

## 2011-12-09 MED ORDER — DOXEPIN HCL 25 MG PO CAPS
25.0000 mg | ORAL_CAPSULE | Freq: Three times a day (TID) | ORAL | Status: DC
  Administered 2011-12-09 – 2011-12-10 (×4): 25 mg via ORAL
  Filled 2011-12-09 (×6): qty 1

## 2011-12-09 MED ORDER — SODIUM CHLORIDE 0.9 % IJ SOLN: 3.0000 mL | Freq: Two times a day (BID) | INTRAMUSCULAR | Status: DC

## 2011-12-09 MED ORDER — ONDANSETRON HCL 4 MG PO TABS: 4.0000 mg | ORAL_TABLET | Freq: Four times a day (QID) | ORAL | Status: DC | PRN

## 2011-12-09 MED ORDER — GABAPENTIN 100 MG PO CAPS
100.0000 mg | ORAL_CAPSULE | Freq: Four times a day (QID) | ORAL | Status: DC
  Administered 2011-12-09 – 2011-12-10 (×5): 100 mg via ORAL
  Filled 2011-12-09 (×8): qty 1

## 2011-12-09 MED ORDER — ONDANSETRON HCL 4 MG/2ML IJ SOLN: 4.0000 mg | Freq: Four times a day (QID) | INTRAMUSCULAR | Status: DC | PRN

## 2011-12-09 MED ORDER — WARFARIN SODIUM 7.5 MG PO TABS
7.5000 mg | ORAL_TABLET | Freq: Once | ORAL | Status: AC
  Administered 2011-12-09: 7.5 mg via ORAL
  Filled 2011-12-09: qty 1

## 2011-12-09 MED ORDER — ADULT MULTIVITAMIN W/MINERALS CH
1.0000 | ORAL_TABLET | Freq: Every day | ORAL | Status: DC
  Administered 2011-12-09 – 2011-12-10 (×2): 1 via ORAL
  Filled 2011-12-09 (×2): qty 1

## 2011-12-09 MED ORDER — SODIUM CHLORIDE 0.9 % IV SOLN: 250.0000 mL | INTRAVENOUS | Status: DC | PRN

## 2011-12-09 MED ORDER — PANTOPRAZOLE SODIUM 40 MG PO TBEC
40.0000 mg | DELAYED_RELEASE_TABLET | Freq: Every day | ORAL | Status: DC
  Administered 2011-12-09 – 2011-12-10 (×2): 40 mg via ORAL
  Filled 2011-12-09 (×2): qty 1

## 2011-12-09 MED ORDER — ENOXAPARIN SODIUM 40 MG/0.4ML ~~LOC~~ SOLN
40.0000 mg | SUBCUTANEOUS | Status: DC
  Administered 2011-12-09 – 2011-12-10 (×2): 40 mg via SUBCUTANEOUS
  Filled 2011-12-09 (×2): qty 0.4

## 2011-12-09 MED ORDER — WARFARIN - PHARMACIST DOSING INPATIENT: Freq: Every day | Status: DC

## 2011-12-09 MED ORDER — ALPRAZOLAM 0.5 MG PO TABS
1.0000 mg | ORAL_TABLET | Freq: Three times a day (TID) | ORAL | Status: DC | PRN
  Administered 2011-12-09 – 2011-12-10 (×4): 1 mg via ORAL
  Filled 2011-12-09 (×2): qty 1
  Filled 2011-12-09 (×2): qty 2
  Filled 2011-12-09: qty 3
  Filled 2011-12-09: qty 1

## 2011-12-09 NOTE — ED Provider Notes (Signed)
I saw and evaluated the patient, reviewed the resident's note and I agree with the findings and plan.   .Face to face Exam:  General:  Awake HEENT:  Atraumatic Resp:  Normal effort Abd:  Nondistended Neuro:No focal weakness Lymph: No adenopathy   Nelia Shi, MD 12/09/11 0021

## 2011-12-09 NOTE — Progress Notes (Signed)
ANTICOAGULATION CONSULT NOTE - Initial Consult  Pharmacy Consult for Coumadin Indication: atrial fibrillation  Allergies  Allergen Reactions  . Sulfa Drugs Cross Reactors Other (See Comments)    unknown  . Ultram (Tramadol Hcl) Itching    "don't remember how bad"  . Xarelto (Rivaroxaban) Other (See Comments)    Pt doesn't remember the reaction but physician told to stop taking it.    Patient Measurements: Height: 5\' 11"  (180.3 cm) Weight: 194 lb 4.8 oz (88.134 kg) IBW/kg (Calculated) : 75.3   Vital Signs: Temp: 97.5 F (36.4 C) (08/09 0152) Temp src: Oral (08/09 0152) BP: 147/86 mmHg (08/09 0152) Pulse Rate: 70  (08/09 0152)  Labs:  Basename 12/09/11 0220 12/09/11 0207 12/08/11 2105  HGB 13.6 -- 13.6  HCT 41.0 -- 40.7  PLT 168 -- 171  APTT -- -- --  LABPROT -- -- 18.4*  INR -- -- 1.50*  HEPARINUNFRC -- -- --  CREATININE 0.97 -- 1.07  CKTOTAL -- 68 --  CKMB -- 2.3 --  TROPONINI -- <0.30 --    Estimated Creatinine Clearance: 74.4 ml/min (by C-G formula based on Cr of 0.97).   Medical History: Past Medical History  Diagnosis Date  . Ischemic cardiomyopathy     EF 40% by heart cath 01/2011   . Hypertension   . Hyperlipidemia   . GERD (gastroesophageal reflux disease)   . Anxiety   . CAD (coronary artery disease)     s/p CABGx4 in 1991, with redo surgery in 2007 with  SVG to an OM and sequential SVG to the PDA and PLV, recent cath 01/2011 with patent grafts  . Atrial flutter October 2012    S/P ablation - not successful  . CHB (complete heart block) October 2012    S/P PTVP following ablation  . Kidney cysts     CYST ON BOTH KIDNEYS  . Hiatal hernia   . Fibromyalgia   . Depression   . Other "heavy-for-dates" infants elevated PSA level  . A-fib   . Pacemaker   . Anginal pain   . Elevated PSA, less than 10 ng/ml 10/2011    "went from 5 to 9"    Medications:  Prescriptions prior to admission  Medication Sig Dispense Refill  . ALPRAZolam (XANAX) 1  MG tablet Take 1 mg by mouth 3 (three) times daily as needed. For anxiety      . atorvastatin (LIPITOR) 40 MG tablet Take 40 mg by mouth daily.      . Calcium Carbonate-Vitamin D (CALCIUM + D PO) Take 1 tablet by mouth daily.       . carvedilol (COREG) 12.5 MG tablet Take 0.5 tablets (6.25 mg total) by mouth 2 (two) times daily with a meal.  180 tablet  3  . ciprofloxacin (CIPRO) 500 MG tablet Take 500 mg by mouth 2 (two) times daily. Started 12-05-11 for 10 days ending 12-15-11      . doxepin (SINEQUAN) 25 MG capsule Take 25 mg by mouth 3 (three) times daily.        . fexofenadine (ALLEGRA) 60 MG tablet Take 60 mg by mouth as needed. For allergy symptoms      . gabapentin (NEURONTIN) 100 MG capsule Take 100 mg by mouth 4 (four) times daily.       . isosorbide mononitrate (IMDUR) 60 MG 24 hr tablet Take 1 tablet (60 mg total) by mouth daily.  90 tablet  3  . Multiple Vitamin (MULTIVITAMIN) tablet Take 1 tablet by mouth  daily.        . nitroGLYCERIN (NITROSTAT) 0.4 MG SL tablet Place 0.4 mg under the tongue every 5 (five) minutes as needed. For chest pain      . omeprazole (PRILOSEC) 20 MG capsule Take 1 capsule (20 mg total) by mouth 2 (two) times daily.  180 capsule  3  . warfarin (COUMADIN) 5 MG tablet Take 5-7.5 mg by mouth daily. 7.5mg   on Monday, Wednesday, and Friday all other days pt takes 1 tablet (5 mg)       Scheduled:    . aspirin EC  325 mg Oral Daily  . atorvastatin  40 mg Oral q1800  . carvedilol  6.25 mg Oral BID WC  . ciprofloxacin  500 mg Oral Q12H  . doxepin  25 mg Oral TID  . enoxaparin (LOVENOX) injection  40 mg Subcutaneous Q24H  . gabapentin  100 mg Oral QID  . isosorbide mononitrate  60 mg Oral Daily  . loratadine  10 mg Oral Daily  .  morphine injection  4 mg Intravenous Once  .  morphine injection  4 mg Intravenous Once  . multivitamin with minerals  1 tablet Oral Daily  . nitroGLYCERIN  1 inch Topical Once  . pantoprazole  40 mg Oral Q1200  . sodium chloride  3 mL  Intravenous Q12H  . sodium chloride  3 mL Intravenous Q12H  . DISCONTD: aspirin  324 mg Oral Once  . DISCONTD: multivitamin  1 tablet Oral Daily    Assessment: 71 yo male with h/o atrial fibrillation admitted with chest pain. Pharmacy consulted to manage Coumadin. Home Coumadin regimen of 5mg  daily, except 7.5mg  on Monday, Wednesday, and Fridays. INR (1.5) is below-goal. Patient with order for Lovenox 40mg  SQ Q24H.   Goal of Therapy:  INR 2-3 Monitor platelets by anticoagulation protocol: Yes   Plan:  1. Coumadin 7.5mg  po today at 18:00.  2. Daily PT / INR.  Emeline Gins 12/09/2011,3:09 AM

## 2011-12-09 NOTE — Progress Notes (Addendum)
Patient seen and examined by me.  Please see H&P by Dr. Conley Rolls done on 8/9/ at 3 AM.  Await cardiac enzymes to cycle.  Does not seem like cardiac as patient had a recent clean cath (1 year ago).  Patient says chest wall is sore this AM.  Has history of fibromyalgia.  Marlin Canary DO

## 2011-12-09 NOTE — H&P (Signed)
[facility] History and Physical  [name] ZOX:[id number] DOB: 12-Jul-1940    PCP:   Cassell Clement, MD   Chief Complaint: atypical chest pain.  HPI: [name] is an 71 y.o. male with hx of known CAD, s/p CABG and redo, with negative cardiac cath 1 year ago, hx of GERD, anxiety, HTN, Hyperlipidemia, Afib on anticoagulation, presents to the ER with 2 hours of substernal chest pressure, nonradiating, with out associated SOB, lightheadedness, or N/V/ or diaphoresis.  He took NTG and antacid without any relief.  In the ER, he was rather pain free.  Pain was not pleuritic and he had no SOB, fever, chills, or coughs.  Eval in the ER showed EKG with ventricular paced.  He has negative initial cardiac markers, Hb 13.6 g/DL, and negative Cr.  Hospitalist was asked to admit patient for cardiac r/out.  Rewiew of Systems:  Constitutional: Negative for malaise, fever and chills. No significant weight loss or weight gain Eyes: Negative for eye pain, redness and discharge, diplopia, visual changes, or flashes of light. ENMT: Negative for ear pain, hoarseness, nasal congestion, sinus pressure and sore throat. No headaches; tinnitus, drooling, or problem swallowing. Cardiovascular: Negative for palpitations, diaphoresis, dyspnea and peripheral edema. ; No orthopnea, PND Respiratory: Negative for cough, hemoptysis, wheezing and stridor. No pleuritic chestpain. Gastrointestinal: Negative for nausea, vomiting, diarrhea, constipation, abdominal pain, melena, blood in stool, hematemesis, jaundice and rectal bleeding.    Genitourinary: Negative for frequency, dysuria, incontinence,flank pain and hematuria; Musculoskeletal: Negative for back pain and neck pain. Negative for swelling and trauma.;  Skin: . Negative for pruritus, rash, abrasions, bruising and skin lesion.; ulcerations Neuro: Negative for headache, lightheadedness and neck stiffness. Negative for weakness, altered level of  consciousness , altered mental status, extremity weakness, burning feet, involuntary movement, seizure and syncope.  Psych: negative for depression, insomnia, tearfulness, panic attacks, hallucinations, paranoia, suicidal or homicidal ideation    Past Medical History  Diagnosis Date  . Ischemic cardiomyopathy     EF 40% by heart cath 01/2011   . Hypertension   . Hyperlipidemia   . GERD (gastroesophageal reflux disease)   . Anxiety   . CAD (coronary artery disease)     s/p CABGx4 in 1991, with redo surgery in 2007 with  SVG to an OM and sequential SVG to the PDA and PLV, recent cath 01/2011 with patent grafts  . Atrial flutter October 2012    S/P ablation - not successful  . CHB (complete heart block) October 2012    S/P PTVP following ablation  . Kidney cysts     CYST ON BOTH KIDNEYS  . Hiatal hernia   . Fibromyalgia   . Depression   . Other "heavy-for-dates" infants elevated PSA level  . A-fib   . Pacemaker   . Anginal pain   . Elevated PSA, less than 10 ng/ml 10/2011    "went from 5 to 9"    Past Surgical History  Procedure Date  . Coronary artery bypass graft 1991    CABG X 5  . Coronary artery bypass graft 06/2005    CABG X 3  . Cardiac catheterization 10/30/2009    Grafts patent. Mild to moderate LV dysfunction. EF 45%. Managed medically.   . Cardiac catheterization     multiple  . Tonsillectomy 1960  . Cholecystectomy 2007  . Insert / replace / remove pacemaker 2012  . Inguinal hernia repair 1960's    "left I think"  . Cataract extraction w/ intraocular  lens  implant, bilateral 2012    Medications:  HOME MEDS: Prior to Admission medications   Medication Sig Start Date End Date Taking? Authorizing Provider  ALPRAZolam Prudy Feeler) 1 MG tablet Take 1 mg by mouth 3 (three) times daily as needed. For anxiety 02/28/11  Yes Cassell Clement, MD  atorvastatin (LIPITOR) 40 MG tablet Take 40 mg by mouth daily.   Yes Historical Provider, MD  Calcium Carbonate-Vitamin D  (CALCIUM + D PO) Take 1 tablet by mouth daily.    Yes Historical Provider, MD  carvedilol (COREG) 12.5 MG tablet Take 0.5 tablets (6.25 mg total) by mouth 2 (two) times daily with a meal. 11/08/11  Yes Rosalio Macadamia, NP  ciprofloxacin (CIPRO) 500 MG tablet Take 500 mg by mouth 2 (two) times daily. Started 12-05-11 for 10 days ending 12-15-11   Yes Historical Provider, MD  doxepin (SINEQUAN) 25 MG capsule Take 25 mg by mouth 3 (three) times daily.     Yes Historical Provider, MD  fexofenadine (ALLEGRA) 60 MG tablet Take 60 mg by mouth as needed. For allergy symptoms   Yes Historical Provider, MD  gabapentin (NEURONTIN) 100 MG capsule Take 100 mg by mouth 4 (four) times daily.    Yes Historical Provider, MD  isosorbide mononitrate (IMDUR) 60 MG 24 hr tablet Take 1 tablet (60 mg total) by mouth daily. 06/15/11  Yes Cassell Clement, MD  Multiple Vitamin (MULTIVITAMIN) tablet Take 1 tablet by mouth daily.     Yes Historical Provider, MD  nitroGLYCERIN (NITROSTAT) 0.4 MG SL tablet Place 0.4 mg under the tongue every 5 (five) minutes as needed. For chest pain 04/29/11  Yes Cassell Clement, MD  omeprazole (PRILOSEC) 20 MG capsule Take 1 capsule (20 mg total) by mouth 2 (two) times daily. 08/08/11  Yes Cassell Clement, MD  warfarin (COUMADIN) 5 MG tablet Take 5-7.5 mg by mouth daily. 7.5mg   on Monday, Wednesday, and Friday all other days pt takes 1 tablet (5 mg) 06/15/11  Yes Cassell Clement, MD     Allergies:  Allergies  Allergen Reactions  . Sulfa Drugs Cross Reactors Other (See Comments)    unknown  . Ultram (Tramadol Hcl) Itching    "don't remember how bad"  . Xarelto (Rivaroxaban) Other (See Comments)    Pt doesn't remember the reaction but physician told to stop taking it.    Social History:   reports that he quit smoking about 38 years ago. His smoking use included Cigarettes. He has a 10 pack-year smoking history. He has never used smokeless tobacco. He reports that he does not drink alcohol or  use illicit drugs.  Family History: Family History  Problem Relation Age of Onset  . Stroke Mother   . Heart attack Father      Physical Exam: Filed Vitals:   12/08/11 2133 12/08/11 2304 12/09/11 0045 12/09/11 0152  BP: 121/79 146/72 136/89 147/86  Pulse: 70 73 71 70  Temp:    97.5 F (36.4 C)  TempSrc:    Oral  Resp: 14 14 14 16   Height:    5\' 11"  (1.803 m)  Weight:    88.134 kg (194 lb 4.8 oz)  SpO2: 100% 99% 100% 100%   Blood pressure 147/86, pulse 70, temperature 97.5 F (36.4 C), temperature source Oral, resp. rate 16, height 5\' 11"  (1.803 m), weight 88.134 kg (194 lb 4.8 oz), SpO2 100.00%.  GEN:  Pleasant  patient lying in the stretcher in no acute distress; cooperative with exam. PSYCH:  alert  and oriented x4; does not appear anxious or depressed; affect is appropriate. HEENT: Mucous membranes pink and anicteric; PERRLA; EOM intact; no cervical lymphadenopathy nor thyromegaly or carotid bruit; no JVD; There were no stridor. Neck is very supple. Breasts:: Not examined CHEST WALL: No tenderness CHEST: Normal respiration, clear to auscultation bilaterally.  HEART: Regular rate and rhythm.  There are no murmur, rub, or gallops.   BACK: No kyphosis or scoliosis; no CVA tenderness ABDOMEN: soft and non-tender; no masses, no organomegaly, normal abdominal bowel sounds; no pannus; no intertriginous candida. There is no rebound and no distention. Rectal Exam: Not done EXTREMITIES: No bone or joint deformity; age-appropriate arthropathy of the hands and knees; no edema; no ulcerations.  There is no calf tenderness. Genitalia: not examined PULSES: 2+ and symmetric SKIN: Normal hydration no rash or ulceration CNS: Cranial nerves 2-12 grossly intact no focal lateralizing neurologic deficit.  Speech is fluent; uvula elevated with phonation, facial symmetry and tongue midline. DTR are normal bilaterally, cerebella exam is intact, barbinski is negative and strengths are equaled  bilaterally.  No sensory loss.   Labs on Admission:  Basic Metabolic Panel:  Lab 12/09/11 1610 12/08/11 2105  NA 134* 132*  K 3.9 4.7  CL 99 97  CO2 27 30  GLUCOSE 94 100*  BUN 9 10  CREATININE 0.97 1.07  CALCIUM 8.4 8.6  MG -- --  PHOS -- --   Liver Function Tests: No results found for this basename: AST:5,ALT:5,ALKPHOS:5,BILITOT:5,PROT:5,ALBUMIN:5 in the last 168 hours No results found for this basename: LIPASE:5,AMYLASE:5 in the last 168 hours No results found for this basename: AMMONIA:5 in the last 168 hours CBC:  Lab 12/09/11 0220 12/08/11 2105  WBC 6.2 7.3  NEUTROABS -- 4.6  HGB 13.6 13.6  HCT 41.0 40.7  MCV 88.9 89.6  PLT 168 171   Cardiac Enzymes:  Lab 12/09/11 0207  CKTOTAL 68  CKMB 2.3  CKMBINDEX --  TROPONINI <0.30    CBG: No results found for this basename: GLUCAP:5 in the last 168 hours   Radiological Exams on Admission: Dg Chest 2 View  12/08/2011  *RADIOLOGY REPORT*  Clinical Data: Chest pain, weakness  CHEST - 2 VIEW  Comparison: 10/20/2011  Findings: Coronary bypass changes and left subclavian pacer noted. Stable heart size and vascularity.  Negative for CHF or pneumonia. No effusion or pneumothorax.  Trachea is midline.  Atherosclerosis of the aorta.  IMPRESSION: Stable chest exam.  No superimposed acute process  Original Report Authenticated By: Judie Petit. Ruel Favors, M.D.    EKG: Independently reviewed.   ventricular pacing.   Assessment/Plan Present on Admission:  .Ischemic cardiomyopathy .Chest pain .Hypertension .GERD (gastroesophageal reflux disease) .Paroxysmal atrial flutter .Pacemaker-stJudes   PLAN:  Will admit to telemetry for atypical chest pain for cardiac r/out.  Will continue all his meds including anticoagulation for his afib.  He is stable, full code, and will be admitted to telemetry under [facility] service.    Other plans as per orders.  Code Status:  FULL Unk Lightning, MD. [facility] Pager [phone] 7pm to  7am.  12/09/2011, 3:11 AM

## 2011-12-09 NOTE — ED Notes (Signed)
Unable to call report at this time , pt. Has chest pain 1/10 . ntg paste applied.

## 2011-12-09 NOTE — Progress Notes (Signed)
Utilization review completed.  

## 2011-12-10 DIAGNOSIS — I119 Hypertensive heart disease without heart failure: Secondary | ICD-10-CM

## 2011-12-10 DIAGNOSIS — I4891 Unspecified atrial fibrillation: Secondary | ICD-10-CM

## 2011-12-10 DIAGNOSIS — F411 Generalized anxiety disorder: Secondary | ICD-10-CM

## 2011-12-10 LAB — PROTIME-INR: INR: 1.47 (ref 0.00–1.49)

## 2011-12-10 MED ORDER — WARFARIN SODIUM 7.5 MG PO TABS
7.5000 mg | ORAL_TABLET | Freq: Once | ORAL | Status: DC
  Filled 2011-12-10: qty 1

## 2011-12-10 NOTE — Discharge Summary (Signed)
Physician Discharge Summary  AGASTYA MEISTER WUJ:[id number] DOB: [birth year] DOA: 12/08/2011  PCP: Cassell Clement, MD  Admit date: 12/08/2011 Discharge date: 12/10/2011  Discharge Diagnoses:  Principal Problem:  *Chest pain Active Problems:  Ischemic cardiomyopathy  Hypertension  GERD (gastroesophageal reflux disease)  Hx of CABG  Paroxysmal atrial flutter  Pacemaker-stJudes   Discharge Condition: improved  Diet recommendation: cardiac  Wt Readings from Last 3 Encounters:  12/09/11 88.134 kg (194 lb 4.8 oz)  11/21/11 86.183 kg (190 lb)  11/08/11 89.177 kg (196 lb 9.6 oz)    History of present illness:  [name] is an 71 y.o. male with hx of known CAD, s/p CABG and redo, with negative cardiac cath 1 year ago, hx of GERD, anxiety, HTN, Hyperlipidemia, Afib on anticoagulation, presents to the ER with 2 hours of substernal chest pressure, nonradiating, with out associated SOB, lightheadedness, or N/V/ or diaphoresis. He took NTG and antacid without any relief. In the ER, he was rather pain free. Pain was not pleuritic and he had no SOB, fever, chills, or coughs. Eval in the ER showed EKG with ventricular paced. He has negative initial cardiac markers, Hb 13.6 g/DL, and negative Cr. Hospitalist was asked to admit patient for cardiac r/out.   Hospital Course:  Came in with chest pain.  CE negative- patient attributes pain to either his GERD symptoms or anxiety.  Tele WNL, no further episodes of chest pain while in hospital.     Discharge Exam: Filed Vitals:   12/10/11 0500  BP: 125/74  Pulse: 69  Temp: 97.8 F (36.6 C)  Resp: 18   Filed Vitals:   12/09/11 0620 12/09/11 1400 12/09/11 2100 12/10/11 0500  BP: 135/84 98/58 118/76 125/74  Pulse: 69 67 70 69  Temp: 98.3 F (36.8 C) 98.2 F (36.8 C) 98.1 F (36.7 C) 97.8 F (36.6 C)  TempSrc: Oral  Oral Oral  Resp: 18 18 18 18   Height:      Weight:      SpO2: 97% 98% 99% 95%    General: A+Ox3, NAD Cardiovascular:  rrr Respiratory: clear anterior  Discharge Instructions  Discharge Orders    Future Appointments: Provider: Department: Dept Phone: Center:   02/20/2012 9:30 AM Cassell Clement, MD [facility] [phone] None   02/20/2012 9:45 AM [facility] [phone] [facility]     Future Orders Please Complete By Expires   Diet - low sodium heart healthy      Increase activity slowly      Discharge instructions      Comments:   INR on Monday Return to ER if symptoms return     Medication List  As of 12/10/2011  8:49 AM   TAKE these medications         ALPRAZolam 1 MG tablet   Commonly known as: XANAX   Take 1 mg by mouth 3 (three) times daily as needed. For anxiety      atorvastatin 40 MG tablet   Commonly known as: LIPITOR   Take 40 mg by mouth daily.      CALCIUM + D PO   Take 1 tablet by mouth daily.      carvedilol 12.5 MG tablet   Commonly known as: COREG   Take 0.5 tablets (6.25 mg total) by mouth 2 (two) times daily with a meal.      ciprofloxacin 500 MG tablet   Commonly known as: CIPRO   Take 500 mg by mouth 2 (two) times  daily. Started 12-05-11 for 10 days ending 12-15-11      doxepin 25 MG capsule   Commonly known as: SINEQUAN   Take 25 mg by mouth 3 (three) times daily.      fexofenadine 60 MG tablet   Commonly known as: ALLEGRA   Take 60 mg by mouth as needed. For allergy symptoms      gabapentin 100 MG capsule   Commonly known as: NEURONTIN   Take 100 mg by mouth 4 (four) times daily.      isosorbide mononitrate 60 MG 24 hr tablet   Commonly known as: IMDUR   Take 1 tablet (60 mg total) by mouth daily.      multivitamin tablet   Take 1 tablet by mouth daily.      nitroGLYCERIN 0.4 MG SL tablet   Commonly known as: NITROSTAT   Place 0.4 mg under the tongue every 5 (five) minutes as needed. For chest pain      omeprazole 20 MG capsule   Commonly known as: PRILOSEC   Take 1 capsule (20 mg total) by mouth 2 (two) times  daily.      warfarin 5 MG tablet   Commonly known as: COUMADIN   Take 5-7.5 mg by mouth daily. 7.5mg   on Monday, Wednesday, and Friday all other days pt takes 1 tablet (5 mg)           Follow-up Information    Follow up with Cassell Clement, MD in 1 month.   Contact information:   [address], [address] [place] [address] [phone]       Please follow up. (follow up with psychiatrist for anxiety and ways to cope)           The results of significant diagnostics from this hospitalization (including imaging, microbiology, ancillary and laboratory) are listed below for reference.    Significant Diagnostic Studies: Dg Chest 2 View  12/08/2011  *RADIOLOGY REPORT*  Clinical Data: Chest pain, weakness  CHEST - 2 VIEW  Comparison: 10/20/2011  Findings: Coronary bypass changes and left subclavian pacer noted. Stable heart size and vascularity.  Negative for CHF or pneumonia. No effusion or pneumothorax.  Trachea is midline.  Atherosclerosis of the aorta.  IMPRESSION: Stable chest exam.  No superimposed acute process  Original Report Authenticated By: Judie Petit. Ruel Favors, M.D.    Microbiology: No results found for this or any previous visit (from the past 240 hour(s)).   Labs: Basic Metabolic Panel:  Lab 12/09/11 4132 12/08/11 2105  NA 134* 132*  K 3.9 4.7  CL 99 97  CO2 27 30  GLUCOSE 94 100*  BUN 9 10  CREATININE 0.97 1.07  CALCIUM 8.4 8.6  MG -- --  PHOS -- --   Liver Function Tests: No results found for this basename: AST:5,ALT:5,ALKPHOS:5,BILITOT:5,PROT:5,ALBUMIN:5 in the last 168 hours No results found for this basename: LIPASE:5,AMYLASE:5 in the last 168 hours No results found for this basename: AMMONIA:5 in the last 168 hours CBC:  Lab 12/09/11 0220 12/08/11 2105  WBC 6.2 7.3  NEUTROABS -- 4.6  HGB 13.6 13.6  HCT 41.0 40.7  MCV 88.9 89.6  PLT 168 171   Cardiac Enzymes:  Lab 12/09/11 1750 12/09/11 1025 12/09/11 0207  CKTOTAL 99 69 68    CKMB 2.2 2.6 2.3  CKMBINDEX -- -- --  TROPONINI <0.30 <0.30 <0.30   BNP: BNP (last 3 results)  Basename 02/10/11 0353  PROBNP 279.8*   CBG: No results found  for this basename: GLUCAP:5 in the last 168 hours  Time coordinating discharge: 45 minutes  Signed:  Marlin Canary  [facility] 12/10/2011, 8:49 AM

## 2011-12-10 NOTE — Progress Notes (Signed)
ANTICOAGULATION CONSULT NOTE - Initial Consult  Pharmacy Consult for Coumadin Indication: atrial fibrillation  Allergies  Allergen Reactions  . Sulfa Drugs Cross Reactors Other (See Comments)    unknown  . Ultram (Tramadol Hcl) Itching    "don't remember how bad"  . Xarelto (Rivaroxaban) Other (See Comments)    Pt doesn't remember the reaction but physician told to stop taking it.    Patient Measurements: Height: 5\' 11"  (180.3 cm) Weight: 194 lb 4.8 oz (88.134 kg) IBW/kg (Calculated) : 75.3   Vital Signs: Temp: 97.8 F (36.6 C) (08/10 0500) Temp src: Oral (08/10 0500) BP: 125/74 mmHg (08/10 0500) Pulse Rate: 69  (08/10 0500)  Labs:  Basename 12/10/11 0540 12/09/11 1750 12/09/11 1025 12/09/11 0220 12/09/11 0207 12/08/11 2105  HGB -- -- -- 13.6 -- 13.6  HCT -- -- -- 41.0 -- 40.7  PLT -- -- -- 168 -- 171  APTT -- -- -- -- -- --  LABPROT 18.1* -- -- -- -- 18.4*  INR 1.47 -- -- -- -- 1.50*  HEPARINUNFRC -- -- -- -- -- --  CREATININE -- -- -- 0.97 -- 1.07  CKTOTAL -- 99 69 -- 68 --  CKMB -- 2.2 2.6 -- 2.3 --  TROPONINI -- <0.30 <0.30 -- <0.30 --    Estimated Creatinine Clearance: 74.4 ml/min (by C-G formula based on Cr of 0.97).   Medical History: Past Medical History  Diagnosis Date  . Ischemic cardiomyopathy     EF 40% by heart cath 01/2011   . Hypertension   . Hyperlipidemia   . GERD (gastroesophageal reflux disease)   . Anxiety   . CAD (coronary artery disease)     s/p CABGx4 in 1991, with redo surgery in 2007 with  SVG to an OM and sequential SVG to the PDA and PLV, recent cath 01/2011 with patent grafts  . Atrial flutter October 2012    S/P ablation - not successful  . CHB (complete heart block) October 2012    S/P PTVP following ablation  . Kidney cysts     CYST ON BOTH KIDNEYS  . Hiatal hernia   . Fibromyalgia   . Depression   . Other "heavy-for-dates" infants elevated PSA level  . A-fib   . Pacemaker   . Anginal pain   . Elevated PSA, less  than 10 ng/ml 10/2011    "went from 5 to 9"    Medications:  Prescriptions prior to admission  Medication Sig Dispense Refill  . ALPRAZolam (XANAX) 1 MG tablet Take 1 mg by mouth 3 (three) times daily as needed. For anxiety      . atorvastatin (LIPITOR) 40 MG tablet Take 40 mg by mouth daily.      . Calcium Carbonate-Vitamin D (CALCIUM + D PO) Take 1 tablet by mouth daily.       . carvedilol (COREG) 12.5 MG tablet Take 0.5 tablets (6.25 mg total) by mouth 2 (two) times daily with a meal.  180 tablet  3  . ciprofloxacin (CIPRO) 500 MG tablet Take 500 mg by mouth 2 (two) times daily. Started 12-05-11 for 10 days ending 12-15-11      . doxepin (SINEQUAN) 25 MG capsule Take 25 mg by mouth 3 (three) times daily.        . fexofenadine (ALLEGRA) 60 MG tablet Take 60 mg by mouth as needed. For allergy symptoms      . gabapentin (NEURONTIN) 100 MG capsule Take 100 mg by mouth 4 (four) times daily.       [name] Kitchen  isosorbide mononitrate (IMDUR) 60 MG 24 hr tablet Take 1 tablet (60 mg total) by mouth daily.  90 tablet  3  . Multiple Vitamin (MULTIVITAMIN) tablet Take 1 tablet by mouth daily.        . nitroGLYCERIN (NITROSTAT) 0.4 MG SL tablet Place 0.4 mg under the tongue every 5 (five) minutes as needed. For chest pain      . omeprazole (PRILOSEC) 20 MG capsule Take 1 capsule (20 mg total) by mouth 2 (two) times daily.  180 capsule  3  . warfarin (COUMADIN) 5 MG tablet Take 5-7.5 mg by mouth daily. 7.5mg   on Monday, Wednesday, and Friday all other days pt takes 1 tablet (5 mg)       Scheduled:     . aspirin EC  325 mg Oral Daily  . atorvastatin  40 mg Oral q1800  . carvedilol  6.25 mg Oral BID WC  . ciprofloxacin  500 mg Oral Q12H  . doxepin  25 mg Oral TID  . enoxaparin (LOVENOX) injection  40 mg Subcutaneous Q24H  . gabapentin  100 mg Oral QID  . isosorbide mononitrate  60 mg Oral Daily  . loratadine  10 mg Oral Daily  . multivitamin with minerals  1 tablet Oral Daily  . pantoprazole  40 mg Oral Q1200    . sodium chloride  3 mL Intravenous Q12H  . sodium chloride  3 mL Intravenous Q12H  . warfarin  7.5 mg Oral ONCE-1800  . Warfarin - Pharmacist Dosing Inpatient   Does not apply q1800    Assessment: 71 yo male with h/o atrial fibrillation admitted with chest pain. Pharmacy consulted to manage Coumadin. Home Coumadin regimen of 5mg  daily, except 7.5mg  on Monday, Wednesday, and Fridays. INR 1.47 today. Compliance issue at home?  Goal of Therapy:  INR 2-3 Monitor platelets by anticoagulation protocol: Yes   Plan:  1. Coumadin 7.5mg  po today 2. Daily PT / INR.  Ulyses Southward Nesbitt 12/10/2011,8:47 AM

## 2011-12-11 MED ORDER — ONDANSETRON HCL 4 MG/2ML IJ SOLN
INTRAMUSCULAR | Status: AC
  Filled 2011-12-11: qty 2

## 2011-12-12 ENCOUNTER — Telehealth: Payer: Self-pay | Admitting: Cardiology

## 2011-12-12 NOTE — Telephone Encounter (Signed)
Called and spoke with patient, did sent his hospital information to a psychiatrist

## 2011-12-12 NOTE — Telephone Encounter (Signed)
New msg Pt wants to talk to you please call cell phone back

## 2011-12-16 ENCOUNTER — Telehealth: Payer: Self-pay | Admitting: Cardiology

## 2011-12-16 NOTE — Telephone Encounter (Signed)
Pt needs to have gall bladder surgery and needs clearance. Dr. Abbey Chatters at [facility] will be doing the surgery and pt wants to talk about this

## 2011-12-16 NOTE — Telephone Encounter (Signed)
Advised patient and faxed to Dr Abbey Chatters

## 2011-12-16 NOTE — Telephone Encounter (Signed)
Need hernia surgery not gall bladder surgery.  Will forward to  Dr. Patty Sermons for review

## 2011-12-16 NOTE — Telephone Encounter (Signed)
Patient is cleared from a cardiac standpoint for hernia surgery

## 2011-12-19 ENCOUNTER — Telehealth: Payer: Self-pay | Admitting: Cardiology

## 2011-12-19 NOTE — Telephone Encounter (Signed)
New msg Bernie from Dr Abbey Chatters office wants to talk to dr Jenness Corner nurse

## 2011-12-19 NOTE — Telephone Encounter (Signed)
Bernie from [facility] calls today regarding surgical clearance. She received the clearance but as far as she could tell they have never seen the patient. She will call back tomorrow when [facility] and Dr. Patty Sermons are in the office. Mylo Red RN

## 2011-12-20 ENCOUNTER — Telehealth: Payer: Self-pay | Admitting: Cardiology

## 2011-12-20 NOTE — Telephone Encounter (Signed)
Agree with advice given

## 2011-12-20 NOTE — Telephone Encounter (Signed)
Called and discussed with Cyndra Numbers, she will call patient and schedule appointment

## 2011-12-20 NOTE — Telephone Encounter (Signed)
I spoke with the pt and he still is having a dull ache in his chest.  I asked the pt if he has discomfort when he presses on his chest and he does notice some soreness.  I advised the pt to continue to rest and contact the office if he has any further issues.  The pt has taken some Tylenol also for chest discomfort.  I will forward this message to Regis Bill LPN and Dr Patty Sermons to make them aware that I spoke with the pt.

## 2011-12-20 NOTE — Telephone Encounter (Signed)
I spoke with the pt and he called the office because he is having chest pain.  The pt does not feel like this is heart pain and thinks it is a panic attack.  The pt rated initial CP as 7-8/10 and pain is now 2-3/10, pt denies any other associated symptoms. The pt did take 3 NTG, ASA, Maalox and a Xanax due to symptoms.  The pt does seem very anxious over the phone.  I instructed the pt to sit down, take a deep breath and try to relax. The pt agrees with plan. I will touch base with the pt later today to see if his symptoms resolved.

## 2011-12-20 NOTE — Telephone Encounter (Signed)
discuss

## 2011-12-20 NOTE — Telephone Encounter (Signed)
Please return call to patient [phone]  Pt c/o of dull chest pains, released from hospital 18 days ago.  Pt doesn't know if this is a panic attack .

## 2011-12-23 ENCOUNTER — Telehealth (INDEPENDENT_AMBULATORY_CARE_PROVIDER_SITE_OTHER): Payer: Self-pay | Admitting: General Surgery

## 2011-12-23 NOTE — Telephone Encounter (Signed)
Pt calling about his umbilical hernia.  He visited his PCP (in [facility]) today, secondary to increased pain from hernia.  PCP recommended abdominal binder, which was reinforced by me.  Explained how to put it on and how it will give him the support he needs.  Pt placed on Wait List to move appt up.

## 2011-12-27 ENCOUNTER — Other Ambulatory Visit: Payer: Self-pay | Admitting: *Deleted

## 2011-12-27 MED ORDER — ISOSORBIDE MONONITRATE ER 60 MG PO TB24: 60.0000 mg | ORAL_TABLET | Freq: Every day | ORAL | Status: DC

## 2012-01-05 ENCOUNTER — Telehealth: Payer: Self-pay | Admitting: *Deleted

## 2012-01-05 ENCOUNTER — Ambulatory Visit (INDEPENDENT_AMBULATORY_CARE_PROVIDER_SITE_OTHER): Payer: Medicare Other | Admitting: General Surgery

## 2012-01-05 ENCOUNTER — Encounter (INDEPENDENT_AMBULATORY_CARE_PROVIDER_SITE_OTHER): Payer: Self-pay | Admitting: General Surgery

## 2012-01-05 VITALS — BP 150/80 | HR 60 | Temp 98.3°F | Resp 16 | Ht 71.0 in | Wt 195.0 lb

## 2012-01-05 DIAGNOSIS — K429 Umbilical hernia without obstruction or gangrene: Secondary | ICD-10-CM

## 2012-01-05 NOTE — Patient Instructions (Signed)
Avoid repetitive heavy lifting. 

## 2012-01-05 NOTE — Telephone Encounter (Signed)
Patient walked in today stating that he had about 3 episodes of where he felt a little uneasy on his feet.  Patient has had a headache above eyes on forehead and under eyes, sore to touch even.  Describes headache as more of a dull ache. Patient saw ENT and was told sinuses looked ok.  Patient stated when he went to mailbox yesterday he had an episode where he felt the unsteadiness and when he came back he took blood pressure and within normal limits. Patient denies any weakness or other symptoms. Dr. Patty Sermons out of the office so advised patient to contact his PCP, verbalized understanding

## 2012-01-05 NOTE — Progress Notes (Signed)
Patient ID: [name], male   DOB: 04-Mar-1941, 71 y.o.   MRN: [id number]  Chief Complaint  Patient presents with  . Umbilical Hernia    HPI [name] is a 71 y.o. male.   HPI He is referred by Dr. Patty Sermons for evaluation of an umbilical hernia.  He was admitted to the hospital last month because he is having chest pain. An acute coronary event was ruled out. He was examined and noted to have an umbilical hernia. The examination was very uncomfortable with respect to having hernia pushed back and and he was sore for 23 days after that but is now getting better. He does not do heavy lifting routinely. He does not have to strain to urinate or have constipation. He is here to have the umbilical hernia evaluated. There are no obstruction type symptoms.  Past Medical History  Diagnosis Date  . Ischemic cardiomyopathy     EF 40% by heart cath 01/2011   . Hypertension   . Hyperlipidemia   . GERD (gastroesophageal reflux disease)   . Anxiety   . CAD (coronary artery disease)     s/p CABGx4 in 1991, with redo surgery in 2007 with  SVG to an OM and sequential SVG to the PDA and PLV, recent cath 01/2011 with patent grafts  . Atrial flutter October 2012    S/P ablation - not successful  . CHB (complete heart block) October 2012    S/P PTVP following ablation  . Kidney cysts     CYST ON BOTH KIDNEYS  . Hiatal hernia   . Fibromyalgia   . Depression   . Other "heavy-for-dates" infants elevated PSA level  . A-fib   . Pacemaker   . Anginal pain   . Elevated PSA, less than 10 ng/ml 10/2011    "went from 5 to 9"    Past Surgical History  Procedure Date  . Coronary artery bypass graft 1991    CABG X 5  . Coronary artery bypass graft 06/2005    CABG X 3  . Cardiac catheterization 10/30/2009    Grafts patent. Mild to moderate LV dysfunction. EF 45%. Managed medically.   . Cardiac catheterization     multiple  . Tonsillectomy 1960  . Cholecystectomy 2007  . Insert / replace /  remove pacemaker 2012  . Inguinal hernia repair 1960's    "left I think"  . Cataract extraction w/ intraocular lens  implant, bilateral 2012    Family History  Problem Relation Age of Onset  . Stroke Mother   . Heart attack Father     Social History History  Substance Use Topics  . Smoking status: Former Smoker -- 1.0 packs/day for 10 years    Types: Cigarettes    Quit date: 09/21/1973  . Smokeless tobacco: Never Used  . Alcohol Use: No    Allergies  Allergen Reactions  . Sulfa Drugs Cross Reactors Other (See Comments)    unknown  . Ultram (Tramadol Hcl) Itching    "don't remember how bad"  . Xarelto (Rivaroxaban) Other (See Comments)    Pt doesn't remember the reaction but physician told to stop taking it.    Current Outpatient Prescriptions  Medication Sig Dispense Refill  . ALPRAZolam (XANAX) 1 MG tablet Take 1 mg by mouth 3 (three) times daily as needed. For anxiety      . atorvastatin (LIPITOR) 40 MG tablet Take 40 mg by mouth daily.      . Calcium Carbonate-Vitamin  D (CALCIUM + D PO) Take 1 tablet by mouth daily.       . carvedilol (COREG) 12.5 MG tablet Take 0.5 tablets (6.25 mg total) by mouth 2 (two) times daily with a meal.  180 tablet  3  . ciprofloxacin (CIPRO) 500 MG tablet Take 500 mg by mouth 2 (two) times daily. Started 12-05-11 for 10 days ending 12-15-11      . doxepin (SINEQUAN) 25 MG capsule Take 25 mg by mouth 3 (three) times daily.        . fexofenadine (ALLEGRA) 60 MG tablet Take 60 mg by mouth as needed. For allergy symptoms      . gabapentin (NEURONTIN) 100 MG capsule Take 100 mg by mouth 4 (four) times daily.       . isosorbide mononitrate (IMDUR) 60 MG 24 hr tablet Take 1 tablet (60 mg total) by mouth daily.  90 tablet  3  . Multiple Vitamin (MULTIVITAMIN) tablet Take 1 tablet by mouth daily.        . nitroGLYCERIN (NITROSTAT) 0.4 MG SL tablet Place 0.4 mg under the tongue every 5 (five) minutes as needed. For chest pain      . omeprazole  (PRILOSEC) 20 MG capsule Take 1 capsule (20 mg total) by mouth 2 (two) times daily.  180 capsule  3  . warfarin (COUMADIN) 5 MG tablet Take 5-7.5 mg by mouth daily. 7.5mg   on Monday, Wednesday, and Friday all other days pt takes 1 tablet (5 mg)        Review of Systems Review of Systems  HENT: Positive for congestion.   Cardiovascular: Positive for chest pain.  Gastrointestinal: Positive for abdominal pain. Negative for nausea, vomiting and constipation.  Neurological: Positive for dizziness, light-headedness and headaches.    Blood pressure 150/80, pulse 60, temperature 98.3 F (36.8 C), resp. rate 16, height 5\' 11"  (1.803 m), weight 195 lb (88.451 kg).  Physical Exam Physical Exam  Constitutional: He appears well-developed and well-nourished. No distress.  HENT:  Head: Normocephalic and atraumatic.  Abdominal: Soft.       Very small, reducible umbilical hernia with a subcentimeter palpable fascial defect.  Mildly tender to palpation.  Genitourinary:       No inguinal bulges or palpable hernias.    Data Reviewed Notes on EPIC.  Assessment    Minimally symptomatic umbilical hernia that is very small.   He has significant co-morbidities.    Plan    I do not recommend repair at this time.  If the hernia becomes larger or the symptoms become debilitating I would be glad to see him back.       Cortland Crehan J 01/05/2012, 11:16 AM

## 2012-01-06 NOTE — Telephone Encounter (Signed)
Agree with advice given

## 2012-01-10 NOTE — Telephone Encounter (Signed)
Called patient to see how he is doing and stated he thinks he is feeling better

## 2012-01-13 ENCOUNTER — Ambulatory Visit
Admission: RE | Admit: 2012-01-13 | Discharge: 2012-01-13 | Disposition: A | Discharge status: Home / Self Care | Payer: Medicare Other | Source: Ambulatory Visit | Attending: Diagnostic Neuroimaging | Admitting: Diagnostic Neuroimaging

## 2012-01-13 ENCOUNTER — Other Ambulatory Visit: Payer: Self-pay | Admitting: Diagnostic Neuroimaging

## 2012-01-13 DIAGNOSIS — R51 Headache: Secondary | ICD-10-CM

## 2012-01-18 ENCOUNTER — Encounter: Payer: Self-pay | Admitting: Internal Medicine

## 2012-01-23 ENCOUNTER — Telehealth (INDEPENDENT_AMBULATORY_CARE_PROVIDER_SITE_OTHER): Payer: Self-pay

## 2012-01-23 NOTE — Telephone Encounter (Signed)
Pt has begun walking on his treadmill and is not having any problems.  I told him if he began having pain at the hernia site to stop or decrease the time.  He understood and agreed.

## 2012-02-06 ENCOUNTER — Telehealth: Payer: Self-pay | Admitting: Cardiology

## 2012-02-06 NOTE — Telephone Encounter (Signed)
Has an appointment 10/21 and would like a letter saying best if doesn't live alone anymore.  Was told not to do vacuuming, mopping, or lifting over 10 -15 pounds secondary to hernia. Will forward to  Dr. Patty Sermons for review

## 2012-02-06 NOTE — Telephone Encounter (Signed)
New message:  Pt needs to speak to you regarding hernia that he has.  Surgeon would not do said to big of a risk.  Needs note regarding lease agreement if he has to move in with someone.

## 2012-02-08 NOTE — Telephone Encounter (Signed)
We can discuss at his 02/20/12 OV then create a letter.

## 2012-02-14 NOTE — Telephone Encounter (Signed)
Advised patient

## 2012-02-20 ENCOUNTER — Encounter: Payer: Self-pay | Admitting: Cardiology

## 2012-02-20 ENCOUNTER — Ambulatory Visit (INDEPENDENT_AMBULATORY_CARE_PROVIDER_SITE_OTHER): Payer: Medicare Other | Admitting: Cardiology

## 2012-02-20 ENCOUNTER — Other Ambulatory Visit (INDEPENDENT_AMBULATORY_CARE_PROVIDER_SITE_OTHER): Payer: Medicare Other

## 2012-02-20 VITALS — BP 140/82 | HR 69 | Ht 71.0 in | Wt 192.8 lb

## 2012-02-20 DIAGNOSIS — E7800 Pure hypercholesterolemia, unspecified: Secondary | ICD-10-CM

## 2012-02-20 DIAGNOSIS — I25810 Atherosclerosis of coronary artery bypass graft(s) without angina pectoris: Secondary | ICD-10-CM

## 2012-02-20 DIAGNOSIS — F411 Generalized anxiety disorder: Secondary | ICD-10-CM

## 2012-02-20 DIAGNOSIS — I119 Hypertensive heart disease without heart failure: Secondary | ICD-10-CM

## 2012-02-20 DIAGNOSIS — E785 Hyperlipidemia, unspecified: Secondary | ICD-10-CM

## 2012-02-20 DIAGNOSIS — R0989 Other specified symptoms and signs involving the circulatory and respiratory systems: Secondary | ICD-10-CM

## 2012-02-20 DIAGNOSIS — F419 Anxiety disorder, unspecified: Secondary | ICD-10-CM

## 2012-02-20 LAB — HEPATIC FUNCTION PANEL
ALT: 21 U/L (ref 0–53)
Albumin: 3.7 g/dL (ref 3.5–5.2)
Bilirubin, Direct: 0.2 mg/dL (ref 0.0–0.3)
Total Protein: 6.5 g/dL (ref 6.0–8.3)

## 2012-02-20 LAB — BASIC METABOLIC PANEL
BUN: 10 mg/dL (ref 6–23)
Chloride: 103 mEq/L (ref 96–112)
GFR: 80.98 mL/min (ref >60.00)
Potassium: 4.3 mEq/L (ref 3.5–5.1)

## 2012-02-20 LAB — LIPID PANEL
Cholesterol: 122 mg/dL (ref 0–200)
HDL: 33.7 mg/dL — ABNORMAL LOW (ref >39.00)
LDL Cholesterol: 66 mg/dL (ref 0–99)
Triglycerides: 114 mg/dL (ref 0.0–149.0)
VLDL: 22.8 mg/dL (ref 0.0–40.0)

## 2012-02-20 NOTE — Patient Instructions (Addendum)
Will obtain labs today and call you with the results (lp/bmet/hfp)  Stop your calcium + vitamin D  Your physician recommends that you schedule a follow-up appointment in: 4 months with fasting labs (lp/bmet/hfp)

## 2012-02-21 ENCOUNTER — Telehealth: Payer: Self-pay | Admitting: *Deleted

## 2012-02-21 NOTE — Telephone Encounter (Signed)
Advised patient of lab results  

## 2012-02-21 NOTE — Telephone Encounter (Signed)
Message copied by Burnell Blanks on Tue Feb 21, 2012  1:13 PM ------      Message from: Cassell Clement      Created: Tue Feb 21, 2012 12:04 PM       Please report to patient.  The recent labs are stable. Continue same medication and careful diet.

## 2012-02-21 NOTE — Progress Notes (Signed)
[name] Date of Birth:  19-Oct-1940 [facility] [address] [place], [place]  [address] [phone]         Fax   [phone]  History of Present Illness: This pleasant 71 year old gentleman is seen for a scheduled followup office visit. The patient has a history of multiple medical issues. 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 His last cardiac catheterization in October 2012 showed that his grafts were patent. The patient also has a history of hypercholesterolemia and essential hypertension. He has had a prior history of paroxysmal atrial flutter and he has an underlying pacemaker and is on long-term Coumadin. He has a lot of anxiety. He is a recent widower.    Current Outpatient Prescriptions  Medication Sig Dispense Refill  . ALPRAZolam (XANAX) 1 MG tablet Take 1 mg by mouth 3 (three) times daily as needed. For anxiety      . atorvastatin (LIPITOR) 40 MG tablet Take 40 mg by mouth daily.      . carvedilol (COREG) 12.5 MG tablet Take 0.5 tablets (6.25 mg total) by mouth 2 (two) times daily with a meal.  180 tablet  3  . doxepin (SINEQUAN) 25 MG capsule Take 25 mg by mouth 3 (three) times daily.        . fexofenadine (ALLEGRA) 60 MG tablet Take 60 mg by mouth as needed. For allergy symptoms      . gabapentin (NEURONTIN) 100 MG capsule Take 100 mg by mouth 4 (four) times daily.       . isosorbide mononitrate (IMDUR) 60 MG 24 hr tablet Take 1 tablet (60 mg total) by mouth daily.  90 tablet  3  . Multiple Vitamin (MULTIVITAMIN) tablet Take 1 tablet by mouth daily.        . nitroGLYCERIN (NITROSTAT) 0.4 MG SL tablet Place 0.4 mg under the tongue every 5 (five) minutes as needed. For chest pain      . omeprazole (PRILOSEC) 20 MG capsule Take 1 capsule (20 mg total) by mouth 2 (two) times daily.  180 capsule  3  . warfarin (COUMADIN) 5 MG tablet Take 5-7.5 mg by mouth daily. 7.5mg   on Monday, Wednesday, and Friday all other days pt  takes 1 tablet (5 mg)        Allergies  Allergen Reactions  . Sulfa Drugs Cross Reactors Other (See Comments)    unknown  . Ultram (Tramadol Hcl) Itching    "don't remember how bad"  . Xarelto (Rivaroxaban) Other (See Comments)    Pt doesn't remember the reaction but physician told to stop taking it.    Patient Active Problem List  Diagnosis  . Ischemic cardiomyopathy  . Hypertension  . Hyperlipidemia  . GERD (gastroesophageal reflux disease)  . Anxiety  . Fibromyalgia  . Hx of CABG  . Cough, persistent  . Paroxysmal atrial flutter  . Chest pain  . Atrial fibrillation  . Complete heart block-intermittent  . Pacemaker-stJudes  . CAD (coronary artery disease)  . Chest pain  . Umbilical hernia    History  Smoking status  . Former Smoker -- 1.0 packs/day for 10 years  . Types: Cigarettes  . Quit date: 09/21/1973  Smokeless tobacco  . Never Used    History  Alcohol Use No    Family History  Problem Relation Age of Onset  . Stroke Mother   . Heart attack Father     Review of Systems: Constitutional:  no fever chills diaphoresis or fatigue or change in weight.  Head and neck: no hearing loss, no epistaxis, no photophobia or visual disturbance. Respiratory: No cough, shortness of breath or wheezing. Cardiovascular: No chest pain peripheral edema, palpitations. Gastrointestinal: No abdominal distention, no abdominal pain, no change in bowel habits hematochezia or melena. Genitourinary: No dysuria, no frequency, no urgency, no nocturia. Musculoskeletal:No arthralgias, no back pain, no gait disturbance or myalgias. Neurological: No dizziness, no headaches, no numbness, no seizures, no syncope, no weakness, no tremors. Hematologic: No lymphadenopathy, no easy bruising. Psychiatric: No confusion, no hallucinations, no sleep disturbance.    Physical Exam: Filed Vitals:   02/20/12 0936  BP: 140/82  Pulse: 69   the general appearance reveals a well-developed  well-nourished elderly gentleman in no acute distress.The head and neck exam reveals pupils equal and reactive.  Extraocular movements are full.  There is no scleral icterus.  The mouth and pharynx are normal.  The neck is supple.  The carotids reveal no bruits.  The jugular venous pressure is normal.  The  thyroid is not enlarged.  There is no lymphadenopathy.  The chest is clear to percussion and auscultation.  There are no rales or rhonchi.  Expansion of the chest is symmetrical.  The precordium is quiet.  The first heart sound is normal.  The second heart sound is physiologically split.  There is no murmur gallop rub or click.  There is no abnormal lift or heave.  The abdomen is soft and nontender.  The bowel sounds are normal.  The liver and spleen are not enlarged.  There is a small ventral hernia.  There are no abdominal bruits.  Extremities reveal good pedal pulses.  There is no phlebitis or edema.  There is no cyanosis or clubbing.  Strength is normal and symmetrical in all extremities.  There is no lateralizing weakness.  There are no sensory deficits.  The skin is warm and dry.  There is no rash.     Assessment / Plan: Continue same medication. He may stop hi calcium/vitamin D tablet and he will continue taking his multivitamin which also has vitamin D.  Recheck in 4 months for followup office visit lipid panel hepatic function panel and basal metabolic panel. I gave him a handwritten note on a prescription pad stating that he is unable to do housework such as mopping and sweeping because of his medical issue of the ventral hernia, at his request.

## 2012-02-21 NOTE — Assessment & Plan Note (Signed)
The patient has a history of hypercholesterolemia.  He is on Lipitor.  He was tolerating without side effects.  Blood work today pending

## 2012-02-21 NOTE — Assessment & Plan Note (Signed)
The patient has not been experiencing much recent chest pain.  He has not been exercising as much because his present treadmill does not work well.  If he moves in with his daughter and son-in-law, the treadmill there is a much better piece of machinery.

## 2012-02-21 NOTE — Progress Notes (Signed)
Quick Note:  Please report to patient. The recent labs are stable. Continue same medication and careful diet. ______ 

## 2012-02-21 NOTE — Assessment & Plan Note (Signed)
The patient continues to be quite anxious.  He is considering moving into his daughter's home.  She has encouraged him to do that since he has been told by his general surgeon not to do any mopping or sweeping or housework because of his ventral hernia.  I have encouraged him to make that move also.

## 2012-02-27 ENCOUNTER — Other Ambulatory Visit: Payer: Self-pay

## 2012-02-27 MED ORDER — OMEPRAZOLE 20 MG PO CPDR: 20.0000 mg | DELAYED_RELEASE_CAPSULE | Freq: Two times a day (BID) | ORAL | Status: DC

## 2012-03-02 ENCOUNTER — Telehealth: Payer: Self-pay | Admitting: Cardiology

## 2012-03-02 NOTE — Telephone Encounter (Signed)
Pt having some issues that he needs to talk to melinda about

## 2012-03-02 NOTE — Telephone Encounter (Signed)
Patient had chest discomfort during night last night and night before. Scheduled appointment with  Dr. Patty Sermons for next week, go to ED if worse or no better

## 2012-03-07 ENCOUNTER — Encounter: Payer: Self-pay | Admitting: Cardiology

## 2012-03-07 ENCOUNTER — Ambulatory Visit (INDEPENDENT_AMBULATORY_CARE_PROVIDER_SITE_OTHER): Payer: Medicare Other | Admitting: Cardiology

## 2012-03-07 VITALS — BP 101/69 | HR 70 | Resp 18 | Ht 61.32 in | Wt 190.0 lb

## 2012-03-07 DIAGNOSIS — F411 Generalized anxiety disorder: Secondary | ICD-10-CM

## 2012-03-07 DIAGNOSIS — Z950 Presence of cardiac pacemaker: Secondary | ICD-10-CM

## 2012-03-07 DIAGNOSIS — I10 Essential (primary) hypertension: Secondary | ICD-10-CM

## 2012-03-07 DIAGNOSIS — F419 Anxiety disorder, unspecified: Secondary | ICD-10-CM

## 2012-03-07 DIAGNOSIS — Z951 Presence of aortocoronary bypass graft: Secondary | ICD-10-CM

## 2012-03-07 DIAGNOSIS — I4891 Unspecified atrial fibrillation: Secondary | ICD-10-CM

## 2012-03-07 DIAGNOSIS — R079 Chest pain, unspecified: Secondary | ICD-10-CM

## 2012-03-07 NOTE — Assessment & Plan Note (Signed)
He has had a past history of high blood pressure but his pressure today is borderline low.  He has been experiencing weakness which may be related to stress but could also be related to his borderline low blood pressure.  In the past he has had hypertension.  We will advise him to increase his dietary salt slightly and to monitor his blood pressure at home.

## 2012-03-07 NOTE — Assessment & Plan Note (Signed)
The patient has been having some occasional chest tightness for which he came in to be checked today.  The pain is substernal without radiation.

## 2012-03-07 NOTE — Assessment & Plan Note (Signed)
The patient is on long-term Coumadin.  He was hesitant to take any Tylenol because he was concerned it might affect his Coumadin.  I told him that for his fibromyalgia pain he could certainly take one 325 milligrams Tylenol twice a day without affecting his INR significantly.

## 2012-03-07 NOTE — Assessment & Plan Note (Signed)
EKG today shows atrial fibrillation with paced ventricular rhythm and is unchanged since 12/08/11.  The patient was reassured.

## 2012-03-07 NOTE — Assessment & Plan Note (Signed)
The patient remains extremely anxious.  He was concerned that he might be developing fibromyalgia.  He was also concerned that he might be developing some type of Hodgkin's disease.

## 2012-03-07 NOTE — Patient Instructions (Addendum)
Add Tylenol 325 mg twice a day as needed. If symptoms no better call back  Keep your regularly scheduled appointment

## 2012-03-07 NOTE — Progress Notes (Signed)
[name] Date of Birth:  1940/11/03 [facility] [address] [place], [place]  [address] [phone]  Fax   [phone]  HPI: This pleasant 71 year old gentleman is seen for a work in office visit.  He has been having some chest pains.  He has been anxious. The patient has a history of multiple medical issues. 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 His last cardiac catheterization in October 2012 showed that his grafts were patent. The patient also has a history of hypercholesterolemia and essential hypertension. He has had a prior history of paroxysmal atrial flutter and he has an underlying pacemaker and is on long-term Coumadin. He has a lot of anxiety. He is a recent widower.  He is planning to move in with his daughter and son-in-law this month and will be moving out of the senior citizens housing.   Current Outpatient Prescriptions  Medication Sig Dispense Refill  . ALPRAZolam (XANAX) 1 MG tablet Take 1 mg by mouth 3 (three) times daily as needed. For anxiety      . atorvastatin (LIPITOR) 40 MG tablet Take 40 mg by mouth daily.      . carvedilol (COREG) 12.5 MG tablet Take 0.5 tablets (6.25 mg total) by mouth 2 (two) times daily with a meal.  180 tablet  3  . doxepin (SINEQUAN) 25 MG capsule Take 25 mg by mouth 3 (three) times daily.        . fexofenadine (ALLEGRA) 60 MG tablet Take 60 mg by mouth as needed. For allergy symptoms      . gabapentin (NEURONTIN) 100 MG capsule Take 100 mg by mouth 4 (four) times daily.       . isosorbide mononitrate (IMDUR) 60 MG 24 hr tablet Take 1 tablet (60 mg total) by mouth daily.  90 tablet  3  . Multiple Vitamin (MULTIVITAMIN) tablet Take 1 tablet by mouth daily.        . nitroGLYCERIN (NITROSTAT) 0.4 MG SL tablet Place 0.4 mg under the tongue every 5 (five) minutes as needed. For chest pain      . omeprazole (PRILOSEC) 20 MG capsule Take 1 capsule (20 mg total) by mouth 2 (two) times daily.   180 capsule  3  . warfarin (COUMADIN) 5 MG tablet Take 5-7.5 mg by mouth daily. 7.5mg   on Monday, Wednesday, and Friday all other days pt takes 1 tablet (5 mg)        Allergies  Allergen Reactions  . Sulfa Drugs Cross Reactors Other (See Comments)    unknown  . Ultram (Tramadol Hcl) Itching    "don't remember how bad"  . Xarelto (Rivaroxaban) Other (See Comments)    Pt doesn't remember the reaction but physician told to stop taking it.    Patient Active Problem List  Diagnosis  . Ischemic cardiomyopathy  . Hypertension  . Hyperlipidemia  . GERD (gastroesophageal reflux disease)  . Anxiety  . Fibromyalgia  . Hx of CABG  . Cough, persistent  . Paroxysmal atrial flutter  . Chest pain  . Atrial fibrillation  . Complete heart block-intermittent  . Pacemaker-stJudes  . CAD (coronary artery disease)  . Chest pain  . Umbilical hernia    History  Smoking status  . Former Smoker -- 1.0 packs/day for 10 years  . Types: Cigarettes  . Quit date: 09/21/1973  Smokeless tobacco  . Never Used    History  Alcohol Use No  Family History  Problem Relation Age of Onset  . Stroke Mother   . Heart attack Father     Review of Systems: The patient denies any heat or cold intolerance.  No weight gain or weight loss.  The patient denies headaches or blurry vision.  There is no cough or sputum production.  The patient denies dizziness.  There is no hematuria or hematochezia.  The patient denies any muscle aches or arthritis.  The patient denies any rash.  The patient denies frequent falling or instability.  There is no history of depression or anxiety.  All other systems were reviewed and are negative.   Physical Exam: Filed Vitals:   03/07/12 0859  BP: 101/69  Pulse: 70  Resp: 18   the general appearance reveals an anxious middle-aged gentleman in no distress.The head and neck exam reveals pupils equal and reactive.  Extraocular movements are full.  There is no scleral icterus.   The mouth and pharynx are normal.  The neck is supple.  The carotids reveal no bruits.  The jugular venous pressure is normal.  The  thyroid is not enlarged.  There is no lymphadenopathy.  The chest is clear to percussion and auscultation.  There are no rales or rhonchi.  Expansion of the chest is symmetrical.  Exam of the axilla bilaterally reveal no lymphadenopathy. The precordium is quiet.  The first heart sound is normal.  The second heart sound is physiologically split.  There is no murmur gallop rub or click.  There is no abnormal lift or heave.  The abdomen is soft and nontender.  The bowel sounds are normal.  The liver and spleen are not enlarged.  There are no abdominal masses.  There are no abdominal bruits.  Extremities reveal good pedal pulses.  There is no phlebitis or edema.  There is no cyanosis or clubbing.  Strength is normal and symmetrical in all extremities.  There is no lateralizing weakness.  There are no sensory deficits.  The skin is warm and dry.  There is no rash.   EKG shows atrial fibrillation and a paced ventricular rate at 70   Assessment / Plan: Reassurance.  I believe he will do much better once he is living with his daughter and son-in-law.  I encouraged her to continue regular exercise and continue on same medication and use acetaminophen twice a day as needed for mild fibromyalgia discomfort.  He was told years ago by Dr. Estill Bakes that he probably had fibromyalgia.

## 2012-03-09 ENCOUNTER — Encounter (HOSPITAL_COMMUNITY): Payer: Self-pay

## 2012-03-09 ENCOUNTER — Encounter: Payer: Self-pay | Admitting: Internal Medicine

## 2012-03-09 ENCOUNTER — Ambulatory Visit (INDEPENDENT_AMBULATORY_CARE_PROVIDER_SITE_OTHER): Payer: Medicare Other | Admitting: Internal Medicine

## 2012-03-09 ENCOUNTER — Observation Stay (HOSPITAL_COMMUNITY)
Admission: EM | Admit: 2012-03-09 | Discharge: 2012-03-11 | Disposition: A | Discharge status: Home / Self Care | Payer: Medicare Other | Attending: Cardiology | Admitting: Cardiology

## 2012-03-09 ENCOUNTER — Emergency Department (HOSPITAL_COMMUNITY): Payer: Medicare Other

## 2012-03-09 ENCOUNTER — Inpatient Hospital Stay: Admission: AD | Admit: 2012-03-09 | Payer: Medicare Other | Source: Ambulatory Visit | Admitting: Cardiology

## 2012-03-09 VITALS — BP 130/80 | HR 70 | Ht 71.0 in | Wt 196.1 lb

## 2012-03-09 DIAGNOSIS — Z7902 Long term (current) use of antithrombotics/antiplatelets: Secondary | ICD-10-CM | POA: Insufficient documentation

## 2012-03-09 DIAGNOSIS — Z7901 Long term (current) use of anticoagulants: Secondary | ICD-10-CM | POA: Insufficient documentation

## 2012-03-09 DIAGNOSIS — Z7982 Long term (current) use of aspirin: Secondary | ICD-10-CM | POA: Insufficient documentation

## 2012-03-09 DIAGNOSIS — I10 Essential (primary) hypertension: Secondary | ICD-10-CM | POA: Diagnosis present

## 2012-03-09 DIAGNOSIS — I2510 Atherosclerotic heart disease of native coronary artery without angina pectoris: Secondary | ICD-10-CM | POA: Diagnosis present

## 2012-03-09 DIAGNOSIS — R079 Chest pain, unspecified: Secondary | ICD-10-CM

## 2012-03-09 DIAGNOSIS — R0602 Shortness of breath: Secondary | ICD-10-CM | POA: Insufficient documentation

## 2012-03-09 DIAGNOSIS — I4891 Unspecified atrial fibrillation: Secondary | ICD-10-CM

## 2012-03-09 DIAGNOSIS — E785 Hyperlipidemia, unspecified: Secondary | ICD-10-CM | POA: Diagnosis present

## 2012-03-09 DIAGNOSIS — Z951 Presence of aortocoronary bypass graft: Secondary | ICD-10-CM

## 2012-03-09 DIAGNOSIS — I4892 Unspecified atrial flutter: Secondary | ICD-10-CM | POA: Diagnosis present

## 2012-03-09 DIAGNOSIS — R61 Generalized hyperhidrosis: Secondary | ICD-10-CM | POA: Insufficient documentation

## 2012-03-09 DIAGNOSIS — K219 Gastro-esophageal reflux disease without esophagitis: Secondary | ICD-10-CM | POA: Diagnosis present

## 2012-03-09 DIAGNOSIS — Z79899 Other long term (current) drug therapy: Secondary | ICD-10-CM | POA: Insufficient documentation

## 2012-03-09 DIAGNOSIS — I255 Ischemic cardiomyopathy: Secondary | ICD-10-CM | POA: Diagnosis present

## 2012-03-09 DIAGNOSIS — I2589 Other forms of chronic ischemic heart disease: Secondary | ICD-10-CM | POA: Insufficient documentation

## 2012-03-09 DIAGNOSIS — Z950 Presence of cardiac pacemaker: Secondary | ICD-10-CM | POA: Insufficient documentation

## 2012-03-09 DIAGNOSIS — Z95810 Presence of automatic (implantable) cardiac defibrillator: Secondary | ICD-10-CM | POA: Diagnosis present

## 2012-03-09 DIAGNOSIS — R0789 Other chest pain: Principal | ICD-10-CM

## 2012-03-09 LAB — TROPONIN I
Troponin I: <0.3 ng/mL (ref <0.30)
Troponin I: <0.3 ng/mL (ref <0.30)

## 2012-03-09 LAB — CBC WITH DIFFERENTIAL/PLATELET
Eosinophils Relative: 3 % (ref 0–5)
Lymphocytes Relative: 36 % (ref 12–46)
Lymphs Abs: 2.4 10*3/uL (ref 0.7–4.0)
MCV: 88 fL (ref 78.0–100.0)
Platelets: 174 10*3/uL (ref 150–400)
RBC: 4.82 MIL/uL (ref 4.22–5.81)
WBC: 6.5 10*3/uL (ref 4.0–10.5)

## 2012-03-09 LAB — PACEMAKER DEVICE OBSERVATION
AL AMPLITUDE: 1.4 mv
ATRIAL PACING PM: 14
BATTERY VOLTAGE: 2.9478 V
BRDY-0002RV: 60 {beats}/min
BRDY-0004RV: 120 {beats}/min
VENTRICULAR PACING PM: 99

## 2012-03-09 LAB — APTT: aPTT: 38 seconds — ABNORMAL HIGH (ref 24–37)

## 2012-03-09 LAB — PROTIME-INR: INR: 1.89 — ABNORMAL HIGH (ref 0.00–1.49)

## 2012-03-09 LAB — BASIC METABOLIC PANEL
CO2: 31 mEq/L (ref 19–32)
Calcium: 9 mg/dL (ref 8.4–10.5)
Chloride: 102 mEq/L (ref 96–112)
Glucose, Bld: 96 mg/dL (ref 70–99)
Potassium: 4.9 mEq/L (ref 3.5–5.1)
Sodium: 137 mEq/L (ref 135–145)

## 2012-03-09 MED ORDER — ASPIRIN EC 81 MG PO TBEC
81.0000 mg | DELAYED_RELEASE_TABLET | Freq: Every day | ORAL | Status: DC
  Administered 2012-03-09 – 2012-03-11 (×3): 81 mg via ORAL
  Filled 2012-03-09 (×3): qty 1

## 2012-03-09 MED ORDER — ENOXAPARIN SODIUM 100 MG/ML ~~LOC~~ SOLN
90.0000 mg | Freq: Two times a day (BID) | SUBCUTANEOUS | Status: DC
  Administered 2012-03-10 – 2012-03-11 (×3): 90 mg via SUBCUTANEOUS
  Filled 2012-03-09 (×5): qty 1

## 2012-03-09 MED ORDER — LORATADINE 10 MG PO TABS
10.0000 mg | ORAL_TABLET | Freq: Every day | ORAL | Status: DC
  Administered 2012-03-10 – 2012-03-11 (×2): 10 mg via ORAL
  Filled 2012-03-09 (×2): qty 1

## 2012-03-09 MED ORDER — ISOSORBIDE MONONITRATE ER 60 MG PO TB24
60.0000 mg | ORAL_TABLET | Freq: Every day | ORAL | Status: DC
  Administered 2012-03-10 – 2012-03-11 (×2): 60 mg via ORAL
  Filled 2012-03-09 (×2): qty 1

## 2012-03-09 MED ORDER — GABAPENTIN 100 MG PO CAPS
200.0000 mg | ORAL_CAPSULE | Freq: Two times a day (BID) | ORAL | Status: DC
  Administered 2012-03-09 – 2012-03-11 (×4): 200 mg via ORAL
  Filled 2012-03-09 (×5): qty 2

## 2012-03-09 MED ORDER — NITROGLYCERIN 0.4 MG SL SUBL: 0.4000 mg | SUBLINGUAL_TABLET | SUBLINGUAL | Status: DC | PRN

## 2012-03-09 MED ORDER — ACETAMINOPHEN 325 MG PO TABS
650.0000 mg | ORAL_TABLET | Freq: Two times a day (BID) | ORAL | Status: DC | PRN
  Administered 2012-03-10: 650 mg via ORAL
  Filled 2012-03-09: qty 2

## 2012-03-09 MED ORDER — CARVEDILOL 6.25 MG PO TABS
6.2500 mg | ORAL_TABLET | Freq: Two times a day (BID) | ORAL | Status: DC
  Administered 2012-03-09 – 2012-03-11 (×5): 6.25 mg via ORAL
  Filled 2012-03-09 (×7): qty 1

## 2012-03-09 MED ORDER — HYPROMELLOSE (GONIOSCOPIC) 2.5 % OP SOLN
1.0000 [drp] | Freq: Four times a day (QID) | OPHTHALMIC | Status: DC | PRN
  Filled 2012-03-09: qty 15

## 2012-03-09 MED ORDER — SENNOSIDES-DOCUSATE SODIUM 8.6-50 MG PO TABS
1.0000 | ORAL_TABLET | Freq: Every day | ORAL | Status: DC
  Administered 2012-03-09 – 2012-03-11 (×3): 1 via ORAL
  Filled 2012-03-09 (×3): qty 1

## 2012-03-09 MED ORDER — DOXEPIN HCL 25 MG PO CAPS
25.0000 mg | ORAL_CAPSULE | Freq: Three times a day (TID) | ORAL | Status: DC
  Administered 2012-03-09 – 2012-03-11 (×6): 25 mg via ORAL
  Filled 2012-03-09 (×7): qty 1

## 2012-03-09 MED ORDER — ADULT MULTIVITAMIN W/MINERALS CH
1.0000 | ORAL_TABLET | Freq: Every day | ORAL | Status: DC
  Administered 2012-03-10 – 2012-03-11 (×2): 1 via ORAL
  Filled 2012-03-09 (×2): qty 1

## 2012-03-09 MED ORDER — ENOXAPARIN SODIUM 100 MG/ML ~~LOC~~ SOLN
90.0000 mg | SUBCUTANEOUS | Status: AC
  Administered 2012-03-09: 90 mg via SUBCUTANEOUS
  Filled 2012-03-09: qty 1

## 2012-03-09 MED ORDER — SODIUM CHLORIDE 0.9 % IJ SOLN
3.0000 mL | Freq: Two times a day (BID) | INTRAMUSCULAR | Status: DC
  Administered 2012-03-09 – 2012-03-11 (×4): 3 mL via INTRAVENOUS

## 2012-03-09 MED ORDER — PANTOPRAZOLE SODIUM 40 MG PO TBEC
40.0000 mg | DELAYED_RELEASE_TABLET | Freq: Every day | ORAL | Status: DC
  Administered 2012-03-09 – 2012-03-11 (×3): 40 mg via ORAL
  Filled 2012-03-09 (×2): qty 1

## 2012-03-09 MED ORDER — ATORVASTATIN CALCIUM 40 MG PO TABS
40.0000 mg | ORAL_TABLET | Freq: Every day | ORAL | Status: DC
  Administered 2012-03-09 – 2012-03-10 (×2): 40 mg via ORAL
  Filled 2012-03-09 (×3): qty 1

## 2012-03-09 MED ORDER — ONDANSETRON HCL 4 MG/2ML IJ SOLN: 4.0000 mg | Freq: Four times a day (QID) | INTRAMUSCULAR | Status: DC | PRN

## 2012-03-09 MED ORDER — ALPRAZOLAM 0.25 MG PO TABS
1.0000 mg | ORAL_TABLET | Freq: Three times a day (TID) | ORAL | Status: DC | PRN
  Administered 2012-03-09 – 2012-03-11 (×3): 1 mg via ORAL
  Filled 2012-03-09 (×3): qty 4

## 2012-03-09 MED ORDER — SODIUM CHLORIDE 0.9 % IJ SOLN: 3.0000 mL | INTRAMUSCULAR | Status: DC | PRN

## 2012-03-09 MED ORDER — SODIUM CHLORIDE 0.9 % IV SOLN: 250.0000 mL | INTRAVENOUS | Status: DC | PRN

## 2012-03-09 NOTE — ED Notes (Signed)
at bedside. 

## 2012-03-09 NOTE — H&P (Signed)
ADDENDUM TO TODAY'S OFFICE NOTE BELOW:  PA NOTE: See below for Dr. Odessa Fleming office note from today which serves as this patient's H&P. Pt also met in the ED by Dr. Patty Sermons. INR 1.89 so plan for cardioversion as noted below requires reconsideration. See below for thoughts by Dr. Patty Sermons.  ED Physical Exam: Vitals in ED: BP 171/86, RR 16, POX 99% RA, P70. General: Well developed lively elderly WM in no acute distress. Head: Normocephalic, atraumatic, sclera non-icteric, no xanthomas, nares are without discharge.  Neck: Negative for carotid bruits. JVD not elevated. Lungs: Crackles at bases otherwise no wheezes or rhonchi. Breathing is unlabored. Heart: RRR with S1, paradoxical S2. No murmurs, rubs, or gallops appreciated. Abdomen: Soft, non-tender, non-distended with normoactive bowel sounds. No hepatomegaly. No rebound/guarding. No obvious abdominal masses. Msk:  Strength and tone appear normal for age. Extremities: No clubbing or cyanosis. No edema.  Distal pedal pulses are 2+ and equal bilaterally. Neuro: Alert and oriented X 3. No focal deficit. No facial asymmetry. Moves all extremities spontaneously. Psych:  Responds to questions appropriately with a normal affect.   Labs today:  Troponin <0.30 x 1. INR 1.89.  Ref. Range 03/09/2012 13:54  Sodium Latest Range: 135-145 mEq/L 137  Potassium Latest Range: 3.5-5.1 mEq/L 4.9  Chloride Latest Range: 96-112 mEq/L 102  CO2 Latest Range: 19-32 mEq/L 31  BUN Latest Range: 6-23 mg/dL 11  Creatinine Latest Range: 0.50-1.35 mg/dL 1.61  Calcium Latest Range: 8.4-10.5 mg/dL 9.0  GFR calc non Af Amer Latest Range: >90 mL/min 67 (L)  GFR calc Af Amer Latest Range: >90 mL/min 78 (L)  Glucose Latest Range: 70-99 mg/dL 96   WBC Latest Range: 4.0-10.5 K/uL 6.5  RBC Latest Range: 4.22-5.81 MIL/uL 4.82  Hemoglobin Latest Range: 13.0-17.0 g/dL 09.6  HCT Latest Range: 39.0-52.0 % 42.4  MCV Latest Range: 78.0-100.0 fL 88.0  MCH Latest Range: 26.0-34.0  pg 28.4  MCHC Latest Range: 30.0-36.0 g/dL 04.5  RDW Latest Range: 11.5-15.5 % 14.8  Platelets Latest Range: 150-400 K/uL 174  CXR: IMPRESSION:No acute cardiopulmonary abnormality seen.  Ronie Spies PA-C ------------------ History and Physical   Patient ID: KEMARION ABBEY  MRN: [id number], SOB: 1940/06/11 71 y.o.  Date of Encounter: 03/09/2012, 12:37 PM   Primary Physician: Rudi Heap, MD  Primary Cardiologist: TB  Primary Electrophysiologist: SK   Chief Complaint: CP  History of Present Illness:  [name] is a 71 y.o. male with coronary artery disease prior CABG and redo CABG 2007 and last catheterization 2012 that showed his grafts were patent. He comes in today for routine pacemaker followup but has complaints now of chest pain for the third time in the last week. He describes it as midsternal associated with shortness of breath and some diaphoresis. It radiates to his neck. He saw Dr. Patty Sermons 2 days ago but did not recounts much of this with him. There was some concern about vague chest discomforts but not so specifically and it was felt at that time that was likely noncardiac, possibly related to the anxiety is associated with the recent death of his wife.  He also carries a diagnosis of fibromyalgia.  He has a history of atrial flutter. He is on anticoagulation for atrial fibrillation.  He denies nocturnal dyspnea early satiety peripheral edema orthopnea. He does have some exercise shortness of breath   Past Medical History   Diagnosis  Date   .  Ischemic cardiomyopathy      EF 40% by heart cath 01/2011   .  Hypertension    .  Hyperlipidemia    .  GERD (gastroesophageal reflux disease)    .  Anxiety    .  CAD (coronary artery disease)      s/p CABGx4 in 1991, with redo surgery in 2007 with SVG to an OM and sequential SVG to the PDA and PLV, recent cath 01/2011 with patent grafts   .  Atrial flutter  October 2012     S/P ablation - not successful   .  CHB (complete  heart block)  October 2012     S/P PTVP following ablation   .  Kidney cysts      CYST ON BOTH KIDNEYS   .  Hiatal hernia    .  Fibromyalgia    .  Depression    .  Other "heavy-for-dates" infants  elevated PSA level   .  A-fib    .  Pacemaker    .  Anginal pain    .  Elevated PSA, less than 10 ng/ml  10/2011     "went from 5 to 9"    Past Surgical History   Procedure  Date   .  Coronary artery bypass graft  1991     CABG X 5   .  Coronary artery bypass graft  06/2005     CABG X 3   .  Cardiac catheterization  10/30/2009     Grafts patent. Mild to moderate LV dysfunction. EF 45%. Managed medically.   .  Cardiac catheterization      multiple   .  Tonsillectomy  1960   .  Cholecystectomy  2007   .  Insert / replace / remove pacemaker  2012   .  Inguinal hernia repair  1960's     "left I think"   .  Cataract extraction w/ intraocular lens implant, bilateral  2012    Current Outpatient Prescriptions   Medication  Sig  Dispense  Refill   .  acetaminophen (TYLENOL) 325 MG tablet  Take 650 mg by mouth 2 (two) times daily as needed.     .  ALPRAZolam (XANAX) 1 MG tablet  Take 1 mg by mouth 3 (three) times daily as needed. For anxiety     .  atorvastatin (LIPITOR) 40 MG tablet  Take 40 mg by mouth daily.     .  carvedilol (COREG) 12.5 MG tablet  Take 0.5 tablets (6.25 mg total) by mouth 2 (two) times daily with a meal.  180 tablet  3   .  doxepin (SINEQUAN) 25 MG capsule  Take 25 mg by mouth 3 (three) times daily.     .  fexofenadine (ALLEGRA) 60 MG tablet  Take 60 mg by mouth as needed. For allergy symptoms     .  gabapentin (NEURONTIN) 100 MG capsule  Take 100 mg by mouth 4 (four) times daily.     .  isosorbide mononitrate (IMDUR) 60 MG 24 hr tablet  Take 1 tablet (60 mg total) by mouth daily.  90 tablet  3   .  Multiple Vitamin (MULTIVITAMIN) tablet  Take 1 tablet by mouth daily.     .  nitroGLYCERIN (NITROSTAT) 0.4 MG SL tablet  Place 0.4 mg under the tongue every 5 (five) minutes as  needed. For chest pain     .  omeprazole (PRILOSEC) 20 MG capsule  Take 1 capsule (20 mg total) by mouth 2 (two) times daily.  180 capsule  3   .  warfarin (COUMADIN) 5 MG tablet  Take 5-7.5 mg by mouth daily. 7.5mg  on Monday, Wednesday, and Friday all other days pt takes 1 tablet (5 mg)      Allergies:  Allergies   Allergen  Reactions   .  Sulfa Drugs Cross Reactors  Other (See Comments)     unknown   .  Ultram (Tramadol Hcl)  Itching     "don't remember how bad"   .  Xarelto (Rivaroxaban)  Other (See Comments)     Pt doesn't remember the reaction but physician told to stop taking it.    History   Substance Use Topics   .  Smoking status:  Former Smoker -- 1.0 packs/day for 10 years     Types:  Cigarettes     Quit date:  09/21/1973   .  Smokeless tobacco:  Never Used   .  Alcohol Use:  No    Family History   Problem  Relation  Age of Onset   .  Stroke  Mother    .  Heart attack  Father     ROS: Please see the history of present illness. Negative except annxiety hiatal hernia, elevated PSA, All other systems reviewed and negative.  Vital Signs:  Blood pressure 130/80, pulse 70, height 5\' 11"  (1.803 m), weight 196 lb 2 oz (88.962 kg).  PHYSICAL EXAM:  General: Well nourished, well developed male in no acute distress\  HEENT: normal  Lymph: no adenopathy  Neck: no JVD  Endocrine: No thryomegaly  Vascular: No carotid bruits; FA pulses 2+ bilaterally without bruits  Cardiac: normal S1, S2; RRR; no murmur  Back: without kyphosis/scoliosis, no CVA tenderness  Lungs: clear to auscultation bilaterally, no wheezing, rhonchi or rales  Abd: soft, nontender, no hepatomegaly  Ext: no edema  Musculoskeletal: No deformities, BUE and BLE strength normal and equal  Skin: warm and dry  Neuro: CNs 2-12 intact, no focal abnormalities noted  Psych: Normal affect   EKG: demonstrates atrial fibrillation with ventricular pacing   Labs:  Lab Results   Component  Value  Date    WBC  6.2   12/09/2011    HGB  13.6  12/09/2011    HCT  41.0  12/09/2011    MCV  88.9  12/09/2011    PLT  168  12/09/2011    No results found for this basename: NA,K,CL,CO2,BUN,CREATININE,CALCIUM,LABALBU,PROT,BILITOT,ALKPHOS,ALT,AST,GLUCOSE in the last 168 hours  No results found for this basename: CKTOTAL:4,CKMB:4,TROPONINI:4 in the last 72 hours   Lab Results   Component  Value  Date    CHOL  122  02/20/2012    HDL  33.70*  02/20/2012    LDLCALC  66  02/20/2012    TRIG  114.0  02/20/2012    No results found for this basename: DDIMER    BNP  Pro B Natriuretic peptide (BNP)   Date/Time  Value  Range  Status   02/10/2011 3:53 AM  279.8*  0 - 125 pg/mL  Final   10/29/2009 11:25 PM  161.0*  0.0 - 100.0 pg/mL  Final   03/21/2007 2:15 AM  114.0*   Final    ASSESSMENT AND PLAN:  #1. Chest discomfort  #2. Atrial fibrillation  #3 coronary artery disease with prior bypass and redo bypass  #4. Heart block status post pacemaker   The patient presents with progressive chest pain. It is concerning for angina; however, on talking with Dr. Patty Sermons, there've been multiple evaluations for chest pain over the last  year. He is recommended that we undertake Myoview scanning as a risk stratifying tool. We will admit him to hospital cycle his cardiac enzymes and undertaking Myoview tomorrow.  If his Myoview is not normal he will undergo Coumadin withdrawal and catheterization on Monday.  Initial plan had been to cardiovert him on this admission but with INR subtherapeutic we may consider TEE cardioversion Monday if no need for cath vs home on 3-4 weeks of adequate INRs then outpatient DCCV.  Agree with exam findings as noted above.  He is in no distress. He is chronically extremely anxious. Lungs reveal a few fine basilar rales. Heart no gallop.Paradoxically split S2.  Abdomen soft, nontender.  Extremities show no phlebitis or edema.. EKG shows paced ventricular rhythm with underlying atrial fib.Chest xray  unremarkable.

## 2012-03-09 NOTE — Progress Notes (Signed)
History and Physical  Patient ID: [name] MRN: [id number], SOB: July 15, 1940 71 y.o. Date of Encounter: 03/09/2012, 12:37 PM  Primary Physician: Rudi Heap, MD Primary Cardiologist: TB Primary Electrophysiologist:  SK  Chief Complaint: CP  History of Present Illness: [name] is a 71 y.o. male  with coronary artery disease prior CABG and redo CABG 2007 and last catheterization 2012 that showed his grafts were patent. He comes in today for routine pacemaker followup but has complaints now of chest pain for the third time in the last week. He describes it as midsternal associated with shortness of breath and some diaphoresis. It radiates to his neck. He saw Dr. Patty Sermons 2 days ago but did not recounts much of this with him. There was some concern about vague chest discomforts but not so specifically and it was felt at that time that was likely noncardiac, possibly related to the anxiety is associated with the recent death of his wife.  He also carries a diagnosis of fibromyalgia.  He has a history of atrial flutter. He is on anticoagulation for atrial fibrillation.  He denies nocturnal dyspnea early satiety peripheral edema orthopnea. He does have some exercise shortness of breath Past Medical History  Diagnosis Date  . Ischemic cardiomyopathy     EF 40% by heart cath 01/2011   . Hypertension   . Hyperlipidemia   . GERD (gastroesophageal reflux disease)   . Anxiety   . CAD (coronary artery disease)     s/p CABGx4 in 1991, with redo surgery in 2007 with  SVG to an OM and sequential SVG to the PDA and PLV, recent cath 01/2011 with patent grafts  . Atrial flutter October 2012    S/P ablation - not successful  . CHB (complete heart block) October 2012    S/P PTVP following ablation  . Kidney cysts     CYST ON BOTH KIDNEYS  . Hiatal hernia   . Fibromyalgia   . Depression   . Other "heavy-for-dates" infants elevated PSA level  . A-fib   . Pacemaker   . Anginal  pain   . Elevated PSA, less than 10 ng/ml 10/2011    "went from 5 to 9"     Past Surgical History  Procedure Date  . Coronary artery bypass graft 1991    CABG X 5  . Coronary artery bypass graft 06/2005    CABG X 3  . Cardiac catheterization 10/30/2009    Grafts patent. Mild to moderate LV dysfunction. EF 45%. Managed medically.   . Cardiac catheterization     multiple  . Tonsillectomy 1960  . Cholecystectomy 2007  . Insert / replace / remove pacemaker 2012  . Inguinal hernia repair 1960's    "left I think"  . Cataract extraction w/ intraocular lens  implant, bilateral 2012      Current Outpatient Prescriptions  Medication Sig Dispense Refill  . acetaminophen (TYLENOL) 325 MG tablet Take 650 mg by mouth 2 (two) times daily as needed.      . ALPRAZolam (XANAX) 1 MG tablet Take 1 mg by mouth 3 (three) times daily as needed. For anxiety      . atorvastatin (LIPITOR) 40 MG tablet Take 40 mg by mouth daily.      . carvedilol (COREG) 12.5 MG tablet Take 0.5 tablets (6.25 mg total) by mouth 2 (two) times daily with a meal.  180 tablet  3  . doxepin (SINEQUAN) 25 MG capsule Take 25 mg by mouth  3 (three) times daily.        . fexofenadine (ALLEGRA) 60 MG tablet Take 60 mg by mouth as needed. For allergy symptoms      . gabapentin (NEURONTIN) 100 MG capsule Take 100 mg by mouth 4 (four) times daily.       . isosorbide mononitrate (IMDUR) 60 MG 24 hr tablet Take 1 tablet (60 mg total) by mouth daily.  90 tablet  3  . Multiple Vitamin (MULTIVITAMIN) tablet Take 1 tablet by mouth daily.        . nitroGLYCERIN (NITROSTAT) 0.4 MG SL tablet Place 0.4 mg under the tongue every 5 (five) minutes as needed. For chest pain      . omeprazole (PRILOSEC) 20 MG capsule Take 1 capsule (20 mg total) by mouth 2 (two) times daily.  180 capsule  3  . warfarin (COUMADIN) 5 MG tablet Take 5-7.5 mg by mouth daily. 7.5mg   on Monday, Wednesday, and Friday all other days pt takes 1 tablet (5 mg)          Allergies: Allergies  Allergen Reactions  . Sulfa Drugs Cross Reactors Other (See Comments)    unknown  . Ultram (Tramadol Hcl) Itching    "don't remember how bad"  . Xarelto (Rivaroxaban) Other (See Comments)    Pt doesn't remember the reaction but physician told to stop taking it.     History  Substance Use Topics  . Smoking status: Former Smoker -- 1.0 packs/day for 10 years    Types: Cigarettes    Quit date: 09/21/1973  . Smokeless tobacco: Never Used  . Alcohol Use: No      Family History  Problem Relation Age of Onset  . Stroke Mother   . Heart attack Father       ROS:  Please see the history of present illness.   Negative except  annxiety hiatal hernia, elevated PSA,  All other systems reviewed and negative.   Vital Signs: Blood pressure 130/80, pulse 70, height 5\' 11"  (1.803 m), weight 196 lb 2 oz (88.962 kg).  PHYSICAL EXAM: General:  Well nourished, well developed male in no acute distress\ HEENT: normal Lymph: no adenopathy Neck: no JVD Endocrine:  No thryomegaly Vascular: No carotid bruits; FA pulses 2+ bilaterally without bruits Cardiac:  normal S1, S2; RRR; no murmur Back: without kyphosis/scoliosis, no CVA tenderness Lungs:  clear to auscultation bilaterally, no wheezing, rhonchi or rales Abd: soft, nontender, no hepatomegaly Ext: no edema Musculoskeletal:  No deformities, BUE and BLE strength normal and equal Skin: warm and dry Neuro:  CNs 2-12 intact, no focal abnormalities noted Psych:  Normal affect   EKG: demonstrates atrial fibrillation with ventricular pacing  Labs:   Lab Results  Component Value Date   WBC 6.2 12/09/2011   HGB 13.6 12/09/2011   HCT 41.0 12/09/2011   MCV 88.9 12/09/2011   PLT 168 12/09/2011   No results found for this basename: NA,K,CL,CO2,BUN,CREATININE,CALCIUM,LABALBU,PROT,BILITOT,ALKPHOS,ALT,AST,GLUCOSE in the last 168 hours No results found for this basename: CKTOTAL:4,CKMB:4,TROPONINI:4 in the last 72 hours Lab  Results  Component Value Date   CHOL 122 02/20/2012   HDL 33.70* 02/20/2012   LDLCALC 66 02/20/2012   TRIG 114.0 02/20/2012   No results found for this basename: DDIMER   BNP Pro B Natriuretic peptide (BNP)  Date/Time Value Range Status  02/10/2011  3:53 AM 279.8* 0 - 125 pg/mL Final  10/29/2009 11:25 PM 161.0* 0.0 - 100.0 pg/mL Final  03/21/2007  2:15 AM 114.0*  Final  ASSESSMENT AND PLAN:   #1. Chest discomfort #2. Atrial fibrillation #3 coronary artery disease with prior bypass and redo bypass  #4. Heart block status post pacemaker  The patient presents with progressive chest pain. It is concerning for angina; however, on talking with Dr. Patty Sermons, there've been multiple evaluations for chest pain over the last year. He is recommended that we undertake Myoview scanning as a risk stratifying tool. We will admit him to hospital cycle his cardiac enzymes and undertaking Myoview tomorrow. In the event that his Myoview is normal, we will anticipate cardioversion on Sunday and discharge at that time. If his Myoview is not normal he will undergo Coumadin withdrawal and catheterization.

## 2012-03-09 NOTE — ED Provider Notes (Signed)
History     CSN: [id number]  Arrival date & time 03/09/12  1321   First MD Initiated Contact with Patient 03/09/12 1336      Chief Complaint  Patient presents with  . Chest Pain    (Consider location/radiation/quality/duration/timing/severity/associated sxs/prior treatment) Patient is a 71 y.o. male presenting with chest pain. The history is provided by the patient.  Chest Pain Primary symptoms include fatigue. Pertinent negatives for primary symptoms include no shortness of breath.    patient's had episodes of chest pain. Been going on for last week but worse today. He saw his cardiologist earlier in the week. He saw his electrophysiologist today who sent the patient for admission. Patient has a history of A. fib and is currently in it. The plan per the note is for a stress test and if it is negative he'll get a cardioversion. If it is positive he will get a cath.  Past Medical History  Diagnosis Date  . Ischemic cardiomyopathy     EF 40% by heart cath 01/2011   . Hypertension   . Hyperlipidemia   . GERD (gastroesophageal reflux disease)   . Anxiety   . CAD (coronary artery disease)     s/p CABGx4 in 1991, with redo surgery in 2007 with  SVG to an OM and sequential SVG to the PDA and PLV, recent cath 01/2011 with patent grafts  . Atrial flutter October 2012    S/P ablation - not successful  . CHB (complete heart block) October 2012    S/P PTVP following ablation  . Kidney cysts     CYST ON BOTH KIDNEYS  . Hiatal hernia   . Fibromyalgia   . Depression   . Other "heavy-for-dates" infants elevated PSA level  . A-fib   . Pacemaker   . Anginal pain   . Elevated PSA, less than 10 ng/ml 10/2011    "went from 5 to 9"    Past Surgical History  Procedure Date  . Coronary artery bypass graft 1991    CABG X 5  . Coronary artery bypass graft 06/2005    CABG X 3  . Cardiac catheterization 10/30/2009    Grafts patent. Mild to moderate LV dysfunction. EF 45%. Managed medically.     . Cardiac catheterization     multiple  . Tonsillectomy 1960  . Cholecystectomy 2007  . Insert / replace / remove pacemaker 2012  . Inguinal hernia repair 1960's    "left I think"  . Cataract extraction w/ intraocular lens  implant, bilateral 2012    Family History  Problem Relation Age of Onset  . Stroke Mother   . Heart attack Father     History  Substance Use Topics  . Smoking status: Former Smoker -- 1.0 packs/day for 10 years    Types: Cigarettes    Quit date: 09/21/1973  . Smokeless tobacco: Never Used  . Alcohol Use: No      Review of Systems  Constitutional: Positive for fatigue. Negative for appetite change.  Respiratory: Negative for chest tightness and shortness of breath.   Cardiovascular: Positive for chest pain.    Allergies  Sulfa drugs cross reactors; Ultram; and Xarelto  Home Medications   Current Outpatient Rx  Name  Route  Sig  Dispense  Refill  . ACETAMINOPHEN 325 MG PO TABS   Oral   Take 650 mg by mouth 2 (two) times daily as needed. pain         . ALPRAZOLAM 1 MG  PO TABS   Oral   Take 1 mg by mouth 3 (three) times daily as needed. For anxiety         . ATORVASTATIN CALCIUM 40 MG PO TABS   Oral   Take 40 mg by mouth daily.         [name] Kitchen CARVEDILOL 12.5 MG PO TABS   Oral   Take 0.5 tablets (6.25 mg total) by mouth 2 (two) times daily with a meal.   180 tablet   3   . DOXEPIN HCL 25 MG PO CAPS   Oral   Take 25 mg by mouth 3 (three) times daily.           [name] Kitchen GABAPENTIN 100 MG PO CAPS   Oral   Take 200 mg by mouth 2 (two) times daily.          [name] Kitchen HYPROMELLOSE 2.5 % OP SOLN   Both Eyes   Place 1 drop into both eyes 4 (four) times daily as needed. Dry eyes         . ISOSORBIDE MONONITRATE ER 60 MG PO TB24   Oral   Take 1 tablet (60 mg total) by mouth daily.   90 tablet   3   . LORATADINE 10 MG PO TABS   Oral   Take 10 mg by mouth daily.         [name] Kitchen ONE-DAILY MULTI VITAMINS PO TABS   Oral   Take 1 tablet by mouth  daily.           [name] Kitchen NITROGLYCERIN 0.4 MG SL SUBL   Sublingual   Place 0.4 mg under the tongue every 5 (five) minutes as needed. For chest pain         . OMEPRAZOLE 20 MG PO CPDR   Oral   Take 1 capsule (20 mg total) by mouth 2 (two) times daily.   180 capsule   3   . SENNA-DOCUSATE SODIUM 8.6-50 MG PO TABS   Oral   Take 1 tablet by mouth daily.         . WARFARIN SODIUM 5 MG PO TABS   Oral   Take 5 mg by mouth daily. 7.5mg   on Monday, Wednesday, and Friday all other days pt takes 1 tablet (5 mg)           BP 120/71  Pulse 70  Temp 97.7 F (36.5 C) (Oral)  Resp 18  Ht 5\' 11"  (1.803 m)  Wt 196 lb (88.905 kg)  BMI 27.34 kg/m2  SpO2 100%  Physical Exam  Nursing note and vitals reviewed. Constitutional: He is oriented to person, place, and time. He appears well-developed and well-nourished.  HENT:  Head: Normocephalic and atraumatic.  Eyes: EOM are normal. Pupils are equal, round, and reactive to light.  Neck: Normal range of motion. Neck supple.  Cardiovascular: Normal rate, regular rhythm and normal heart sounds.   No murmur heard. Pulmonary/Chest: Effort normal and breath sounds normal.       Pacer to left chest wall.  Abdominal: Soft. Bowel sounds are normal. He exhibits no distension and no mass. There is no tenderness. There is no rebound and no guarding.  Musculoskeletal: Normal range of motion. He exhibits no edema.  Neurological: He is alert and oriented to person, place, and time. No cranial nerve deficit.  Skin: Skin is warm and dry.  Psychiatric: He has a normal mood and affect.    ED Course  Procedures (including critical care time)  Labs Reviewed  BASIC METABOLIC PANEL - Abnormal; Notable for the following:    GFR calc non Af Amer 67 (*)     GFR calc Af Amer 78 (*)     All other components within normal limits  PROTIME-INR - Abnormal; Notable for the following:    Prothrombin Time 21.0 (*)     INR 1.89 (*)     All other components within  normal limits  APTT - Abnormal; Notable for the following:    aPTT 38 (*)     All other components within normal limits  CBC WITH DIFFERENTIAL  TROPONIN I  TROPONIN I  TROPONIN I   Dg Chest 2 View  03/09/2012  *RADIOLOGY REPORT*  Clinical Data: Chest pain.  CHEST - 2 VIEW  Comparison: December 08, 2011.  Findings: Sternotomy wires are noted.  Left-sided pacemaker is noted.  Cardiomediastinal silhouette appears normal.  No acute pulmonary disease is noted.  IMPRESSION: No acute cardiopulmonary abnormality seen.   Original Report Authenticated By: Lupita Raider.,  M.D.      1. Chest pain   2. Atrial fibrillation     Date: 03/09/2012  Rate: 70  Rhythm: electronically paced and afib  QRS Axis: normal  Intervals: normal  ST/T Wave abnormalities: normal  Conduction Disutrbances:left bundle branch block  Narrative Interpretation:   Old EKG Reviewed: unchanged     MDM  Patient sent in by cardiology for chest pain. EKG is paced with A. fib. Enzymes are negative. He'll be admitted to cardiology.        Juliet Rude. Rubin Payor, MD 03/09/12 1557

## 2012-03-09 NOTE — ED Notes (Signed)
Dr. Rubin Payor back at the bedside to speak with patient

## 2012-03-09 NOTE — Progress Notes (Signed)
ANTICOAGULATION CONSULT NOTE - Initial Consult  Pharmacy Consult for lovenox Indication: CP, afib  Allergies  Allergen Reactions  . Sulfa Drugs Cross Reactors Other (See Comments)    unknown  . Ultram (Tramadol Hcl) Itching    "don't remember how bad"  . Xarelto (Rivaroxaban) Other (See Comments)    Pt doesn't remember the reaction but physician told to stop taking it.    Patient Measurements: Height: 5\' 11"  (180.3 cm) Weight: 196 lb (88.905 kg) IBW/kg (Calculated) : 75.3   Vital Signs: Temp: 97.7 F (36.5 C) (11/08 1545) Temp src: Oral (11/08 1545) BP: 128/75 mmHg (11/08 1800) Pulse Rate: 69  (11/08 1800)  Labs:  Basename 03/09/12 1356 03/09/12 1354  HGB -- 13.7  HCT -- 42.4  PLT -- 174  APTT -- 38*  LABPROT -- 21.0*  INR -- 1.89*  HEPARINUNFRC -- --  CREATININE -- 1.08  CKTOTAL -- --  CKMB -- --  TROPONINI <0.30 --    Estimated Creatinine Clearance: 66.8 ml/min (by C-G formula based on Cr of 1.08).   Medical History: Past Medical History  Diagnosis Date  . Ischemic cardiomyopathy     EF 40% by heart cath 01/2011   . Hypertension   . Hyperlipidemia   . GERD (gastroesophageal reflux disease)   . Anxiety   . CAD (coronary artery disease)     s/p CABGx4 in 1991, with redo surgery in 2007 with  SVG to an OM and sequential SVG to the PDA and PLV, recent cath 01/2011 with patent grafts  . Atrial flutter October 2012    S/P ablation - not successful  . CHB (complete heart block) October 2012    S/P PTVP following ablation  . Kidney cysts     CYST ON BOTH KIDNEYS  . Hiatal hernia   . Fibromyalgia   . Depression   . Other "heavy-for-dates" infants elevated PSA level  . A-fib   . Pacemaker   . Anginal pain   . Elevated PSA, less than 10 ng/ml 10/2011    "went from 5 to 9"    Medications:  See med rec  Assessment: 65 yom presents with progressive chest pain. He is on chronic coumadin but his INR is subtherapeutic at 1.89. No bleeding noted. CBC is  WNL. Plan for Myoview tomorrow with possible cath on Monday. Coumadin is on hold.   Goal of Therapy:  Anti-Xa level 0.6-1.2 units/ml 4hrs after LMWH dose given Monitor platelets by anticoagulation protocol: Yes   Plan:  1. Lovenox 90mg  SQ Q12H 2. CBC Q72H while on lovenox 3. Hold coumadin 4. F/u cardiology plans  Murlene Revell, Drake Leach 03/09/2012,8:25 PM

## 2012-03-09 NOTE — ED Notes (Signed)
Dr. Pickering at the bedside.  

## 2012-03-09 NOTE — ED Notes (Signed)
Per [facility], pt brought in from [facility] for CP that has been intermittent since last Wednesday. Had pain last Friday and was fine until today. States the pain is dull sensation mid sternal no radiation. Received 4 ASA and 1 NTG pain from 3/10 to 1/10. EMS reports no EKG changes, VSS. Hx of 2 CABG and ventricular pacemaker. 20g LH

## 2012-03-10 DIAGNOSIS — I4891 Unspecified atrial fibrillation: Secondary | ICD-10-CM

## 2012-03-10 LAB — COMPREHENSIVE METABOLIC PANEL
ALT: 17 U/L (ref 0–53)
AST: 20 U/L (ref 0–37)
Alkaline Phosphatase: 71 U/L (ref 39–117)
CO2: 28 mEq/L (ref 19–32)
Chloride: 100 mEq/L (ref 96–112)
GFR calc non Af Amer: 80 mL/min — ABNORMAL LOW (ref >90)
Potassium: 4.2 mEq/L (ref 3.5–5.1)
Sodium: 136 mEq/L (ref 135–145)
Total Bilirubin: 0.9 mg/dL (ref 0.3–1.2)

## 2012-03-10 LAB — TROPONIN I: Troponin I: <0.3 ng/mL (ref <0.30)

## 2012-03-10 LAB — CBC
HCT: 41.1 % (ref 39.0–52.0)
Hemoglobin: 13.5 g/dL (ref 13.0–17.0)
MCHC: 32.8 g/dL (ref 30.0–36.0)
RBC: 4.7 MIL/uL (ref 4.22–5.81)
WBC: 5.7 10*3/uL (ref 4.0–10.5)

## 2012-03-10 LAB — PROTIME-INR: INR: 1.87 — ABNORMAL HIGH (ref 0.00–1.49)

## 2012-03-10 MED ORDER — ALUM & MAG HYDROXIDE-SIMETH 200-200-20 MG/5ML PO SUSP
30.0000 mL | ORAL | Status: DC | PRN
  Administered 2012-03-10 (×2): 30 mL via ORAL
  Filled 2012-03-10 (×3): qty 30

## 2012-03-10 NOTE — Progress Notes (Signed)
Admitted pt to rm 4710 from ED, alert and oriented, call bell placed within reach, denies pain at this time. Admission assessment done, orders carried out. Will continue to monitor.

## 2012-03-10 NOTE — Progress Notes (Signed)
Utilization Review Completed.   Demeco Ducksworth, RN, BSN Nurse Case Manager  [phone]  

## 2012-03-10 NOTE — Progress Notes (Signed)
SUBJECTIVE: The patient is doing well today.  At this time, he denies shortness of breath, or any new concerns.  Chest pain during the night relieved with mylanta.     [name] Kitchen aspirin EC  81 mg Oral Daily  . atorvastatin  40 mg Oral q1800  . carvedilol  6.25 mg Oral BID WC  . doxepin  25 mg Oral TID  . [COMPLETED] enoxaparin (LOVENOX) injection  90 mg Subcutaneous To ER  . enoxaparin (LOVENOX) injection  90 mg Subcutaneous Q12H  . gabapentin  200 mg Oral BID  . isosorbide mononitrate  60 mg Oral Daily  . loratadine  10 mg Oral Daily  . multivitamin with minerals  1 tablet Oral Daily  . pantoprazole  40 mg Oral Daily  . senna-docusate  1 tablet Oral Daily  . sodium chloride  3 mL Intravenous Q12H      OBJECTIVE: Physical Exam: Filed Vitals:   03/09/12 2117 03/09/12 2126 03/10/12 0436 03/10/12 0945  BP: 135/72  92/72 120/78  Pulse: 70  71 70  Temp: 98.1 F (36.7 C)  98.2 F (36.8 C) 98 F (36.7 C)  TempSrc: Oral  Oral Oral  Resp: 14  16 20   Height: 5\' 11"  (1.803 m)     Weight: 190 lb 14.4 oz (86.592 kg)  189 lb 14.4 oz (86.138 kg)   SpO2: 99% 99% 99% 99%    Intake/Output Summary (Last 24 hours) at 03/10/12 1107 Last data filed at 03/10/12 0646  Gross per 24 hour  Intake      0 ml  Output   1250 ml  Net  -1250 ml    Telemetry reveals afib, V paced  GEN- The patient is anxious appearing, alert and oriented x 3 today.   Head- normocephalic, atraumatic Eyes-  Sclera clear, conjunctiva pink Ears- hearing intact Oropharynx- clear Neck- supple,   Lungs- Clear to ausculation bilaterally, normal work of breathing Heart- Regular rate and rhythm (paced) GI- soft, NT, ND, + BS Extremities- no clubbing, cyanosis, or edema Skin- no rash or lesion Psych- euthymic mood, full affect Neuro- strength and sensation are intact  LABS: Basic Metabolic Panel:  Basename 03/10/12 0615 03/09/12 1354  NA 136 137  K 4.2 4.9  CL 100 102  CO2 28 31  GLUCOSE 113* 96  BUN 12 11    CREATININE 0.99 1.08  CALCIUM 8.7 9.0  MG -- --  PHOS -- --   Liver Function Tests:  [facility] 03/10/12 0615  AST 20  ALT 17  ALKPHOS 71  BILITOT 0.9  PROT 6.2  ALBUMIN 3.4*   No results found for this basename: LIPASE:2,AMYLASE:2 in the last 72 hours CBC:  Basename 03/10/12 0615 03/09/12 1354  WBC 5.7 6.5  NEUTROABS -- 3.5  HGB 13.5 13.7  HCT 41.1 42.4  MCV 87.4 88.0  PLT 165 174   Cardiac Enzymes:  Basename 03/10/12 0104 03/09/12 2022 03/09/12 1356  CKTOTAL -- -- --  CKMB -- -- --  CKMBINDEX -- -- --  TROPONINI <0.30 <0.30 <0.30   ASSESSMENT AND PLAN:  Active Problems:  * No active hospital problems. *  1.  Chest pain- atypical CMS negative Will proceed with myoview tomorrow am (schedule unable to accommodate today) Continue current medical regimen  2. afib Given subtherapeutic INR, will not cardiovert during this admission He can return once INRs are again therapeutic for 4 consecutive weaks  If myoview is low risk, hopefully to discharge tomorrow.  Hillis Range, MD 03/10/2012 11:07  AM

## 2012-03-11 ENCOUNTER — Observation Stay (HOSPITAL_COMMUNITY): Payer: Medicare Other

## 2012-03-11 DIAGNOSIS — R0789 Other chest pain: Secondary | ICD-10-CM

## 2012-03-11 DIAGNOSIS — R079 Chest pain, unspecified: Secondary | ICD-10-CM

## 2012-03-11 MED ORDER — REGADENOSON 0.4 MG/5ML IV SOLN
0.4000 mg | Freq: Once | INTRAVENOUS | Status: AC
  Administered 2012-03-11: 0.4 mg via INTRAVENOUS
  Filled 2012-03-11: qty 5

## 2012-03-11 MED ORDER — TECHNETIUM TC 99M SESTAMIBI GENERIC - CARDIOLITE
30.0000 | Freq: Once | INTRAVENOUS | Status: AC | PRN
  Administered 2012-03-11: 30 via INTRAVENOUS

## 2012-03-11 MED ORDER — ASPIRIN 81 MG PO TBEC: 81.0000 mg | DELAYED_RELEASE_TABLET | Freq: Every day | ORAL | Status: DC

## 2012-03-11 MED ORDER — TECHNETIUM TC 99M SESTAMIBI GENERIC - CARDIOLITE
10.0000 | Freq: Once | INTRAVENOUS | Status: AC | PRN
  Administered 2012-03-11: 10 via INTRAVENOUS

## 2012-03-11 MED ORDER — LISINOPRIL 5 MG PO TABS
5.0000 mg | ORAL_TABLET | Freq: Every day | ORAL | Status: DC
  Administered 2012-03-11: 5 mg via ORAL
  Filled 2012-03-11: qty 1

## 2012-03-11 MED ORDER — LISINOPRIL 5 MG PO TABS: 5.0000 mg | ORAL_TABLET | Freq: Every day | ORAL | Status: DC

## 2012-03-11 NOTE — Progress Notes (Signed)
Verbalized understanding of discharge instructions.  IV site discontinued.  In good spirits.  No c/o of pain.

## 2012-03-11 NOTE — Discharge Summary (Signed)
Discharge Summary   Patient ID: [name],  MRN: [id number], DOB/AGE: 71-Jul-1942 71 y.o.  Admit date: 03/09/2012 Discharge date: 03/11/2012  Primary Physician: Rudi Heap, MD Primary Cardiologist: Cassell Clement, MD; Berton Mount, MD (EP)  Discharge Diagnoses Principal Problem:  *Atypical chest pain Active Problems:  Ischemic cardiomyopathy  Hypertension  Hyperlipidemia  GERD (gastroesophageal reflux disease)  Hx of CABG  Paroxysmal atrial flutter  Pacemaker-stJudes  CAD (coronary artery disease)   Allergies Allergies  Allergen Reactions  . Sulfa Drugs Cross Reactors Other (See Comments)    unknown  . Ultram (Tramadol Hcl) Itching    "don't remember how bad"  . Xarelto (Rivaroxaban) Other (See Comments)    Pt doesn't remember the reaction but physician told to stop taking it.    Diagnostic Studies/Procedures  PA/LATERAL CHEST X-RAY - 03/09/12  CHEST - 2 VIEW  Comparison: December 08, 2011.  Findings: Sternotomy wires are noted. Left-sided pacemaker is  noted. Cardiomediastinal silhouette appears normal. No acute  pulmonary disease is noted.  IMPRESSION:  No acute cardiopulmonary abnormality seen.  LEXISCAN MYOVIEW - 03/11/12  IMPRESSION:  1. Left ventricular dysfunction and cardiomyopathy with estimated  ejection fraction of 32% and abnormal wall motion primarily  involving the septum and inferior wall.  2. Inferior and inferolateral scar without evidence of inducible  ischemia.  History of Present Illness  [name] is a 71yo male admitted to [facility] hospital on 03/09/12 w/ the above problem list. He has a history of CAD s/p redo CABG 2007, cath 2013 w/ patent grafts. He followed up in the office on 03/09/12 for routine pacemaker follow-up, but complained of worsening midsternal chest pain w/ associated shortness of breath and diaphoresis radiating to his neck. He noted exertional dyspnea. He carries a diagnosis of fibromyalgia and recently  noted increased stress after his wife passed away. He had followed up two days prior with Dr. Patty Sermons where vague chest symptoms were mentioned, but not concerning at the time. They were attributed to fibromyalgia and anxiety. On follow-up with Dr. Graciela Husbands, the decision was made by Dr. Patty Sermons to admit to [facility] hospital for formal rule out and risk stratification with [facility].  From an EP perspective, he has a PPM and underlying a-fib for which DCCV is being planned. TEE/DCCV post-cath (should the patient's Myoview return abnormal) vs outpatient anticoagulation with reliable therapeutic INRs x 3-4 weeks was suggested.The plan was discussed with the patient who agreed. CXR revealed no acute process.    Hospital Course   He was admitted and serial enzymes drawn which returned WNL x 3. He did have intermittent chest discomfort during the night relieved by Mylanta. He underwent Lexiscan Myoview today which revealed LVEF 32%, septal and inferior WMAs and inferior/inferolateral scar w/o evidence of ischemia. The results were discussed with Dr. Patty Sermons. Prior EF on cath in 2012 was ~ 40%, and Myoview EF was suspected to be consistent with these findings. ACEi will be added. The patient will be followed as an outpatient with repeat echocardiography and consideration of ICD placement. He has been deemed stable for discharge. He will resume home medications. Coumadin will be continued per outpatient dosing. He will follow-up in the Coumadin clinic next week for further recommendations. He will need to maintain a therapeutic INR x 3-4 weeks prior to undergoing elective DCCV for underlying a-fib. He will follow-up with TB or an NP/PA in the office in 1-2 weeks. This information has been clearly outlined in the discharge AVS.   Discharge  Vitals:  Blood pressure 118/69, pulse 68, temperature 97.9 F (36.6 C), temperature source Oral, resp. rate 20, height 5\' 11"  (1.803 m), weight 86.138 kg (189 lb 14.4 oz), SpO2  99.00%.   Weight change: -2.767 kg (-6 lb 1.6 oz)  Labs: Recent Labs  [facility] 03/10/12 0615 03/09/12 1354   WBC 5.7 6.5   HGB 13.5 13.7   HCT 41.1 42.4   MCV 87.4 88.0   PLT 165 174   Lab 03/10/12 0615 03/09/12 1354  NA 136 137  K 4.2 4.9  CL 100 102  CO2 28 31  BUN 12 11  CREATININE 0.99 1.08  CALCIUM 8.7 9.0  PROT 6.2 --  BILITOT 0.9 --  ALKPHOS 71 --  ALT 17 --  AST 20 --  AMYLASE -- --  LIPASE -- --  GLUCOSE 113* 96   Recent Labs  Basename 03/10/12 0104 03/09/12 2022 03/09/12 1356   CKTOTAL -- -- --   CKMB -- -- --   CKMBINDEX -- -- --   TROPONINI <0.30 <0.30 <0.30   Disposition:  Discharge Orders    Future Appointments: Provider: Department: Dept Phone: Center:   06/11/2012 11:55 AM [facility] Main Office [place]) [phone] [facility]   06/21/2012 10:15 AM Cassell Clement, MD [facility] Main Office [place]) [phone] [facility]   06/21/2012 10:30 AM [facility] Main Office [place]) [phone] [facility]     Follow-up Information    Follow up with [facility]. (Office will call you with an appointment date & time. You will see Dr. Patty Sermons or Norma Fredrickson in 1-2 weeks. You will follow-up in the Coumadin clinic this week. )    Contact information:   [address] [place] [address]         Discharge Medications:    Medication List     As of 03/11/2012  5:02 PM    START taking these medications         aspirin 81 MG EC tablet   Take 1 tablet (81 mg total) by mouth daily.      lisinopril 5 MG tablet   Commonly known as: PRINIVIL,ZESTRIL   Take 1 tablet (5 mg total) by mouth daily.      CONTINUE taking these medications         acetaminophen 325 MG tablet   Commonly known as: TYLENOL      ALPRAZolam 1 MG tablet   Commonly known as: XANAX      atorvastatin 40 MG tablet   Commonly known as: LIPITOR      carvedilol 12.5 MG tablet    Commonly known as: COREG   Take 0.5 tablets (6.25 mg total) by mouth 2 (two) times daily with a meal.      doxepin 25 MG capsule   Commonly known as: SINEQUAN      gabapentin 100 MG capsule   Commonly known as: NEURONTIN      hydroxypropyl methylcellulose 2.5 % ophthalmic solution   Commonly known as: ISOPTO TEARS      isosorbide mononitrate 60 MG 24 hr tablet   Commonly known as: IMDUR   Take 1 tablet (60 mg total) by mouth daily.      loratadine 10 MG tablet   Commonly known as: CLARITIN      multivitamin tablet      nitroGLYCERIN 0.4 MG SL tablet   Commonly known as: NITROSTAT      omeprazole 20 MG capsule   Commonly known  as: PRILOSEC   Take 1 capsule (20 mg total) by mouth 2 (two) times daily.      sennosides-docusate sodium 8.6-50 MG tablet   Commonly known as: SENOKOT-S      warfarin 5 MG tablet   Commonly known as: COUMADIN          Where to get your medications    These are the prescriptions that you need to pick up. We sent them to a specific pharmacy, so you will need to go there to get them.   [facility] - [facility], [place] - [address] [facility]    [address] [place] [address]    Phone: [phone]        lisinopril 5 MG tablet         Information on where to get these meds is not yet available. Ask your nurse or doctor.         aspirin 81 MG EC tablet           Outstanding Labs/Studies: PT/INR at Coumadin clinic 11/11-11/15  Duration of Discharge Encounter: Greater than 30 minutes including physician time.  Signed, R. Hurman Horn, PA-C 03/11/2012, 5:02 PM    Hillis Range, MD

## 2012-03-11 NOTE — Progress Notes (Signed)
   SUBJECTIVE: The patient is doing well today.  At this time, he denies shortness of breath, or any new concerns.  Chest pain wall is sore to palpation but no other chest pains today.    [name] Kitchen aspirin EC  81 mg Oral Daily  . atorvastatin  40 mg Oral q1800  . carvedilol  6.25 mg Oral BID WC  . doxepin  25 mg Oral TID  . enoxaparin (LOVENOX) injection  90 mg Subcutaneous Q12H  . gabapentin  200 mg Oral BID  . isosorbide mononitrate  60 mg Oral Daily  . loratadine  10 mg Oral Daily  . multivitamin with minerals  1 tablet Oral Daily  . pantoprazole  40 mg Oral Daily  . [COMPLETED] regadenoson  0.4 mg Intravenous Once  . senna-docusate  1 tablet Oral Daily  . sodium chloride  3 mL Intravenous Q12H      OBJECTIVE: Physical Exam: Filed Vitals:   03/11/12 1023 03/11/12 1025 03/11/12 1026 03/11/12 1029  BP: 121/90 119/84 127/75 124/79  Pulse:      Temp:      TempSrc:      Resp: 18 18 18 18   Height:      Weight:      SpO2:        Intake/Output Summary (Last 24 hours) at 03/11/12 1120 Last data filed at 03/11/12 0900  Gross per 24 hour  Intake    720 ml  Output   2300 ml  Net  -1580 ml    Telemetry reveals afib, V paced  GEN- NAD, alert and oriented x 3 today.   Head- normocephalic, atraumatic Eyes-  Sclera clear, conjunctiva pink Ears- hearing intact Oropharynx- clear Neck- supple,   Lungs- Clear to ausculation bilaterally, normal work of breathing Heart- Regular rate and rhythm (paced) GI- soft, NT, ND, + BS Extremities- no clubbing, cyanosis, or edema Skin- no rash or lesion Psych- euthymic mood, full affect Neuro- strength and sensation are intact  LABS: Basic Metabolic Panel:  Basename 03/10/12 0615 03/09/12 1354  NA 136 137  K 4.2 4.9  CL 100 102  CO2 28 31  GLUCOSE 113* 96  BUN 12 11  CREATININE 0.99 1.08  CALCIUM 8.7 9.0  MG -- --  PHOS -- --   Liver Function Tests:  [facility] 03/10/12 0615  AST 20  ALT 17  ALKPHOS 71  BILITOT 0.9  PROT 6.2    ALBUMIN 3.4*   No results found for this basename: LIPASE:2,AMYLASE:2 in the last 72 hours CBC:  Basename 03/10/12 0615 03/09/12 1354  WBC 5.7 6.5  NEUTROABS -- 3.5  HGB 13.5 13.7  HCT 41.1 42.4  MCV 87.4 88.0  PLT 165 174   Cardiac Enzymes:  Basename 03/10/12 0104 03/09/12 2022 03/09/12 1356  CKTOTAL -- -- --  CKMB -- -- --  CKMBINDEX -- -- --  TROPONINI <0.30 <0.30 <0.30   ASSESSMENT AND PLAN:  Active Problems:  * No active hospital problems. *  1.  Chest pain- atypical CMS negative myoview today If low risk, will discharge this afternoon.  Would reserve cath for high risk myoview. Continue current medical regimen  2. afib Given subtherapeutic INR, will not cardiovert during this admission He can return once INRs are again therapeutic for 4 consecutive weaks  Possible discharge this afternoon.  Hillis Range, MD 03/11/2012 11:20 AM

## 2012-03-11 NOTE — Progress Notes (Signed)
Verbalized understanding of discharge instructions.  IV site discontinued.  Transported via wheelchair to car with son.

## 2012-03-14 ENCOUNTER — Encounter: Payer: Self-pay | Admitting: Cardiology

## 2012-03-19 ENCOUNTER — Ambulatory Visit (INDEPENDENT_AMBULATORY_CARE_PROVIDER_SITE_OTHER): Payer: Medicare Other | Admitting: Cardiology

## 2012-03-19 ENCOUNTER — Encounter: Payer: Self-pay | Admitting: Cardiology

## 2012-03-19 VITALS — BP 112/68 | HR 70 | Ht 71.0 in | Wt 195.0 lb

## 2012-03-19 DIAGNOSIS — I255 Ischemic cardiomyopathy: Secondary | ICD-10-CM

## 2012-03-19 DIAGNOSIS — Z951 Presence of aortocoronary bypass graft: Secondary | ICD-10-CM

## 2012-03-19 DIAGNOSIS — I2589 Other forms of chronic ischemic heart disease: Secondary | ICD-10-CM

## 2012-03-19 DIAGNOSIS — I4891 Unspecified atrial fibrillation: Secondary | ICD-10-CM

## 2012-03-19 DIAGNOSIS — I5022 Chronic systolic (congestive) heart failure: Secondary | ICD-10-CM

## 2012-03-19 NOTE — Assessment & Plan Note (Signed)
The patient has an ischemic cardiomyopathy with most recent ejection fraction by nuclear of 32%.  Lisinopril 5 mg daily has been added to his regimen.  He is not having any cough or side effects.  He has exertional dyspnea which is unchanged.

## 2012-03-19 NOTE — Progress Notes (Addendum)
[name] Date of Birth:  Jul 31, 1940 [facility] [address] [place], [place]  [address] [phone]         Fax   [phone]  History of Present Illness: This pleasant 71 year old gentleman is seen for a post hospital office visit.  He was recently admitted to [facility] on 03/09/12 and discharged on 03/11/12 he was admitted after he complained of having some chest discomfort at the time of a routine pacemaker check up.  On the date of discharge 11//13 the patient underwent a Lexus scan Myoview stress test which revealed left ventricular ejection fraction 32% with septal and inferior wall motion abnormalities and with an inferior lateral scar but no evidence of reversible ischemia.  His prior ejection fraction at the time of catheter in 2012 was approximately 40%. 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 His last cardiac catheterization in October 2012 showed that his grafts were patent. The patient also has a history of hypercholesterolemia and essential hypertension. He has had a prior history of paroxysmal atrial flutter and he has an underlying pacemaker and is on long-term Coumadin. He has a lot of anxiety. He is a recent widower. He is planning to move in with his daughter and son-in-law this month and will be moving out of the senior citizens housing.   Current Outpatient Prescriptions  Medication Sig Dispense Refill  . acetaminophen (TYLENOL) 325 MG tablet Take 650 mg by mouth 2 (two) times daily as needed. pain      . ALPRAZolam (XANAX) 1 MG tablet Take 1 mg by mouth 3 (three) times daily as needed. For anxiety      . aspirin EC 81 MG EC tablet Take 1 tablet (81 mg total) by mouth daily.      [name] Kitchen atorvastatin (LIPITOR) 40 MG tablet Take 40 mg by mouth daily.      . carvedilol (COREG) 12.5 MG tablet Take 0.5 tablets (6.25 mg total) by mouth 2 (two) times daily with a meal.  180 tablet  3  . doxepin (SINEQUAN) 25 MG capsule  Take 25 mg by mouth 3 (three) times daily.        [name] Kitchen gabapentin (NEURONTIN) 100 MG capsule Take 200 mg by mouth 2 (two) times daily.       . hydroxypropyl methylcellulose (ISOPTO TEARS) 2.5 % ophthalmic solution Place 1 drop into both eyes 4 (four) times daily as needed. Dry eyes      . isosorbide mononitrate (IMDUR) 60 MG 24 hr tablet Take 1 tablet (60 mg total) by mouth daily.  90 tablet  3  . lisinopril (PRINIVIL,ZESTRIL) 5 MG tablet Take 1 tablet (5 mg total) by mouth daily.  30 tablet  3  . loratadine (CLARITIN) 10 MG tablet Take 10 mg by mouth daily.      . Multiple Vitamin (MULTIVITAMIN) tablet Take 1 tablet by mouth daily.        . nitroGLYCERIN (NITROSTAT) 0.4 MG SL tablet Place 0.4 mg under the tongue every 5 (five) minutes as needed. For chest pain      . omeprazole (PRILOSEC) 20 MG capsule Take 1 capsule (20 mg total) by mouth 2 (two) times daily.  180 capsule  3  . sennosides-docusate sodium (SENOKOT-S) 8.6-50 MG tablet Take 1 tablet by mouth daily.      [name] Kitchen warfarin (COUMADIN) 5 MG tablet Take 5 mg by mouth daily. 7.5mg   on Monday, Wednesday, and Friday all  other days pt takes 1 tablet (5 mg)        Allergies  Allergen Reactions  . Sulfa Drugs Cross Reactors Other (See Comments)    unknown  . Ultram (Tramadol Hcl) Itching    "don't remember how bad"  . Xarelto (Rivaroxaban) Other (See Comments)    Pt doesn't remember the reaction but physician told to stop taking it.    Patient Active Problem List  Diagnosis  . Ischemic cardiomyopathy  . Hypertension  . Hyperlipidemia  . GERD (gastroesophageal reflux disease)  . Anxiety  . Fibromyalgia  . Hx of CABG  . Cough, persistent  . Paroxysmal atrial flutter  . Chest pain  . Atrial fibrillation  . Complete heart block-intermittent  . Pacemaker-stJudes  . CAD (coronary artery disease)  . Umbilical hernia  . Atypical chest pain    History  Smoking status  . Former Smoker -- 1.0 packs/day for 10 years  . Types: Cigarettes    . Quit date: 09/21/1973  Smokeless tobacco  . Never Used    History  Alcohol Use No    Family History  Problem Relation Age of Onset  . Stroke Mother   . Heart attack Father     Review of Systems: Constitutional: no fever chills diaphoresis or fatigue or change in weight.  Head and neck: no hearing loss, no epistaxis, no photophobia or visual disturbance. Respiratory: No cough, shortness of breath or wheezing. Cardiovascular: No chest pain peripheral edema, palpitations. Gastrointestinal: No abdominal distention, no abdominal pain, no change in bowel habits hematochezia or melena. Genitourinary: No dysuria, no frequency, no urgency, no nocturia. Musculoskeletal:No arthralgias, no back pain, no gait disturbance or myalgias. Neurological: No dizziness, no headaches, no numbness, no seizures, no syncope, no weakness, no tremors. Hematologic: No lymphadenopathy, no easy bruising. Psychiatric: No confusion, no hallucinations, no sleep disturbance.    Physical Exam: Filed Vitals:   03/19/12 0840  BP: 112/68  Pulse: 70   general appearance reveals a well-developed elderly gentleman in no acute distress.The head and neck exam reveals pupils equal and reactive.  Extraocular movements are full.  There is no scleral icterus.  The mouth and pharynx are normal.  The neck is supple.  The carotids reveal no bruits.  The jugular venous pressure is normal.  The  thyroid is not enlarged.  There is no lymphadenopathy.  The chest is clear to percussion and auscultation.  There are no rales or rhonchi.  Expansion of the chest is symmetrical.  The precordium is quiet.  The first heart sound is normal.  The second heart sound is physiologically split.  There is no murmur gallop rub or click.  There is no abnormal lift or heave.  The abdomen is soft and nontender.  The bowel sounds are normal.  The liver and spleen are not enlarged.  There are no abdominal masses.  There are no abdominal bruits.   Extremities reveal good pedal pulses.  There is no phlebitis or edema.  There is no cyanosis or clubbing.  Strength is normal and symmetrical in all extremities.  There is no lateralizing weakness.  There are no sensory deficits.  The skin is warm and dry.  There is no rash.  EKG shows paced ventricular rhythm at 70 per minute.  There is underlying atrial fibrillation .   Assessment / Plan: Continue same medication.  He will get weekly pro[facility]rombin times at [facility].  Once we have 4 consecutive adequate INR readings we will  plan for elective outpatient cardioversion of his atrial fib.  We will plan to see him back for a followup office visit and EKG and lab work in 2 months.

## 2012-03-19 NOTE — Assessment & Plan Note (Signed)
The patient has not had any chest pain or angina since discharge from the hospital.

## 2012-03-19 NOTE — Assessment & Plan Note (Signed)
EKG shows a paced ventricular rhythm and he has underlying atrial fibrillation.  We are planning for an elective direct-current cardioversion once his INR is having been therapeutic above 2.0 for 4 consecutive weeks.  The patient has not been having any TIA symptoms

## 2012-03-19 NOTE — Patient Instructions (Addendum)
Your physician recommends that you continue on your current medications as directed. Please refer to the Current Medication list given to you today.  Your physician recommends that you schedule a follow-up appointment in: 2 month ov/ekg 

## 2012-03-20 ENCOUNTER — Encounter: Payer: Self-pay | Admitting: Cardiology

## 2012-03-26 ENCOUNTER — Telehealth: Payer: Self-pay | Admitting: Cardiology

## 2012-03-26 NOTE — Telephone Encounter (Signed)
Advised ok to walk for exercise as tolerated

## 2012-03-26 NOTE — Telephone Encounter (Signed)
New Problem: ° ° ° °Patient called in wanting to speak with you.  Please call back. °

## 2012-03-27 ENCOUNTER — Telehealth: Payer: Self-pay | Admitting: Internal Medicine

## 2012-03-27 NOTE — Telephone Encounter (Signed)
New Problem:    Patient called in because he mistakenly left his phone in his shirt pocket for 2 hrs and wanted to know if that would affect his ICD.  Please call back.

## 2012-03-27 NOTE — Telephone Encounter (Signed)
Pt reassured that his device will be fine.

## 2012-04-03 ENCOUNTER — Encounter: Payer: Self-pay | Admitting: Cardiology

## 2012-04-05 ENCOUNTER — Telehealth: Payer: Self-pay | Admitting: Cardiology

## 2012-04-05 NOTE — Telephone Encounter (Signed)
New Problem:    Patient called in wanting to speak with you about his blood thinner medication because his heart has been out of rhythm for the past 4 weeks.  Please call back.

## 2012-04-05 NOTE — Telephone Encounter (Signed)
Agree with advice given

## 2012-04-05 NOTE — Telephone Encounter (Signed)
Spoke with patient and he was concerned his INR was only 2.0 yesterday at PCP. Is concerned about it being low (last week 2.3) and wanted to increase his coumadin and take an extra 1/2 pill every other day.  Patient is needing 4 INR's within normal limits before he can have his cardioversion and worried next week will be too low.   Advised not to increase that much but could increase by 1/2 tablet a week and watch his greens intake. Will forward to  Dr. Patty Sermons for review

## 2012-04-11 ENCOUNTER — Telehealth: Payer: Self-pay | Admitting: Cardiology

## 2012-04-11 NOTE — Telephone Encounter (Signed)
plz return call to pt [phone] regarding possible procedure with another physician

## 2012-04-11 NOTE — Telephone Encounter (Signed)
Left message to call back  

## 2012-04-17 ENCOUNTER — Encounter: Payer: Self-pay | Admitting: Cardiology

## 2012-04-24 NOTE — Telephone Encounter (Signed)
Patient never called back.

## 2012-05-07 ENCOUNTER — Encounter: Payer: Self-pay | Admitting: Cardiology

## 2012-05-14 ENCOUNTER — Encounter: Payer: Self-pay | Admitting: *Deleted

## 2012-05-14 ENCOUNTER — Ambulatory Visit (INDEPENDENT_AMBULATORY_CARE_PROVIDER_SITE_OTHER): Payer: Medicare Other | Admitting: Cardiology

## 2012-05-14 ENCOUNTER — Other Ambulatory Visit: Payer: Self-pay | Admitting: *Deleted

## 2012-05-14 ENCOUNTER — Other Ambulatory Visit: Payer: Self-pay | Admitting: Cardiology

## 2012-05-14 ENCOUNTER — Encounter: Payer: Self-pay | Admitting: Cardiology

## 2012-05-14 ENCOUNTER — Other Ambulatory Visit (INDEPENDENT_AMBULATORY_CARE_PROVIDER_SITE_OTHER): Payer: Medicare Other

## 2012-05-14 VITALS — BP 132/68 | HR 71 | Resp 18 | Ht 66.0 in | Wt 192.0 lb

## 2012-05-14 DIAGNOSIS — F411 Generalized anxiety disorder: Secondary | ICD-10-CM

## 2012-05-14 DIAGNOSIS — I4891 Unspecified atrial fibrillation: Secondary | ICD-10-CM

## 2012-05-14 DIAGNOSIS — F419 Anxiety disorder, unspecified: Secondary | ICD-10-CM

## 2012-05-14 DIAGNOSIS — I10 Essential (primary) hypertension: Secondary | ICD-10-CM

## 2012-05-14 DIAGNOSIS — E785 Hyperlipidemia, unspecified: Secondary | ICD-10-CM

## 2012-05-14 DIAGNOSIS — Z01818 Encounter for other preprocedural examination: Secondary | ICD-10-CM

## 2012-05-14 DIAGNOSIS — I4892 Unspecified atrial flutter: Secondary | ICD-10-CM

## 2012-05-14 DIAGNOSIS — R0989 Other specified symptoms and signs involving the circulatory and respiratory systems: Secondary | ICD-10-CM

## 2012-05-14 DIAGNOSIS — I255 Ischemic cardiomyopathy: Secondary | ICD-10-CM

## 2012-05-14 DIAGNOSIS — R05 Cough: Secondary | ICD-10-CM

## 2012-05-14 LAB — HEPATIC FUNCTION PANEL
ALT: 21 U/L (ref 0–53)
AST: 23 U/L (ref 0–37)
Alkaline Phosphatase: 64 U/L (ref 39–117)
Bilirubin, Direct: 0.3 mg/dL (ref 0.0–0.3)
Total Bilirubin: 1.6 mg/dL — ABNORMAL HIGH (ref 0.3–1.2)
Total Protein: 6.6 g/dL (ref 6.0–8.3)

## 2012-05-14 LAB — BASIC METABOLIC PANEL
CO2: 29 mEq/L (ref 19–32)
Chloride: 99 mEq/L (ref 96–112)
Potassium: 4.7 mEq/L (ref 3.5–5.1)
Sodium: 135 mEq/L (ref 135–145)

## 2012-05-14 LAB — PROTIME-INR
INR: 2.5 ratio — ABNORMAL HIGH (ref 0.8–1.0)
Prothrombin Time: 25.7 s — ABNORMAL HIGH (ref 10.2–12.4)

## 2012-05-14 LAB — LIPID PANEL
LDL Cholesterol: 70 mg/dL (ref 0–99)
Total CHOL/HDL Ratio: 3

## 2012-05-14 NOTE — Assessment & Plan Note (Signed)
The patient remains extremely anxious.  He is a recent widower. He has moved out of the senior citizens housing and is now living with his daughter and son-in-law and that seems to be working out okay for him.  The patient eats a lot of his meals at a neighborhood caf.

## 2012-05-14 NOTE — Assessment & Plan Note (Signed)
The patient is no longer having any significant cough but he has developed a scratchy throat and thinks that he may be coming down with a cold.  We will advise him to use Mucinex and to drink plenty of fluids and get plenty of rest.

## 2012-05-14 NOTE — Assessment & Plan Note (Signed)
The patient has not had any TIA symptoms.  We talked about his upcoming cardioversion today.  We were unable to get him on the schedule for January 15 as originally planned and so we will aim for Friday, January 24 at 1 PM.  He will come to the office on January 22 for preop labs.

## 2012-05-14 NOTE — Progress Notes (Signed)
Quick Note:  Please report to patient. The recent labs are stable. Continue same medication and careful diet. ______ 

## 2012-05-14 NOTE — Patient Instructions (Addendum)
Your physician recommends that you continue on your current medications as directed. Please refer to the Current Medication list given to you today.  Will obtain labs today and call you with the results (protime)  Return on 05/22/12 after 9:00 for labs, not fasting  Your physician has recommended that you have a Cardioversion (DCCV). Electrical Cardioversion uses a jolt of electricity to your heart either through paddles or wired patches attached to your chest. This is a controlled, usually prescheduled, procedure. Defibrillation is done under light anesthesia in the hospital, and you usually go home the day of the procedure. This is done to get your heart back into a normal rhythm. You are not awake for the procedure. Please see the instruction sheet given to you today.  YOU ARE SCHEDULED FOR 05/25/12, ARRIVE AT 11:15 AT SHORT STAY   NOTHING TO EAT OR DRINK AFTER MIDNIGHT NIGHT BEFORE, ok to take your morning medications with a small sip of water  YOU WILL NEED SOMEONE TO DRIVE YOU HOME

## 2012-05-14 NOTE — Progress Notes (Signed)
[name] Date of Birth:  May 27, 1940 [facility] [address] [place], [place]  [address] [phone]         Fax   [phone]  History of Present Illness: This pleasant 72 year old gentleman is seen for a pre-cardioversion office visit. He was recently admitted to [facility] on 03/09/12 and discharged on 03/11/12 he was admitted after he complained of having some chest discomfort at the time of a routine pacemaker check up.  He was found to be in atrial fibrillation at the time of admission. On the date of discharge 11//13 the patient underwent a Lexus scan Myoview stress test which revealed left ventricular ejection fraction 32% with septal and inferior wall motion abnormalities and with an inferior lateral scar but no evidence of reversible ischemia. His prior ejection fraction at the time of catheter in 2012 was approximately 40%. 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 His last cardiac catheterization in October 2012 showed that his grafts were patent. The patient also has a history of hypercholesterolemia and essential hypertension. He has had a prior history of paroxysmal atrial flutter and he has an underlying St. Jude pacemaker which was implanted in 2012.  The patient is on long-term Coumadin. He has a lot of anxiety.  The patient has had 4 consecutive weekly INRs which have been therapeutic.   Current Outpatient Prescriptions  Medication Sig Dispense Refill  . acetaminophen (TYLENOL) 325 MG tablet Take 650 mg by mouth 2 (two) times daily as needed. pain      . ALPRAZolam (XANAX) 1 MG tablet Take 1 mg by mouth 3 (three) times daily as needed. For anxiety      . aspirin EC 81 MG EC tablet Take 1 tablet (81 mg total) by mouth daily.      [name] Kitchen atorvastatin (LIPITOR) 40 MG tablet Take 40 mg by mouth daily.      . carvedilol (COREG) 12.5 MG tablet Take 0.5 tablets (6.25 mg total) by mouth 2 (two) times daily with a meal.  180  tablet  3  . doxepin (SINEQUAN) 25 MG capsule Take 25 mg by mouth 3 (three) times daily.        [name] Kitchen gabapentin (NEURONTIN) 100 MG capsule Take 200 mg by mouth 2 (two) times daily.       . hydroxypropyl methylcellulose (ISOPTO TEARS) 2.5 % ophthalmic solution Place 1 drop into both eyes 4 (four) times daily as needed. Dry eyes      . isosorbide mononitrate (IMDUR) 60 MG 24 hr tablet Take 1 tablet (60 mg total) by mouth daily.  90 tablet  3  . lisinopril (PRINIVIL,ZESTRIL) 5 MG tablet Take 1 tablet (5 mg total) by mouth daily.  30 tablet  3  . loratadine (CLARITIN) 10 MG tablet Take 10 mg by mouth daily.      . Multiple Vitamin (MULTIVITAMIN) tablet Take 1 tablet by mouth daily.        . nitroGLYCERIN (NITROSTAT) 0.4 MG SL tablet Place 0.4 mg under the tongue every 5 (five) minutes as needed. For chest pain      . omeprazole (PRILOSEC) 20 MG capsule Take 1 capsule (20 mg total) by mouth 2 (two) times daily.  180 capsule  3  . sennosides-docusate sodium (SENOKOT-S) 8.6-50 MG tablet Take 1 tablet by mouth daily.      [name] Kitchen warfarin (COUMADIN) 5 MG tablet Take 5 mg by mouth daily. 7.5mg   on Monday,  Wednesday, and Friday all other days pt takes 1 tablet (5 mg)        Allergies  Allergen Reactions  . Sulfa Drugs Cross Reactors Other (See Comments)    unknown  . Ultram (Tramadol Hcl) Itching    "don't remember how bad"  . Xarelto (Rivaroxaban) Other (See Comments)    Pt doesn't remember the reaction but physician told to stop taking it.    Patient Active Problem List  Diagnosis  . Ischemic cardiomyopathy  . Hypertension  . Hyperlipidemia  . GERD (gastroesophageal reflux disease)  . Anxiety  . Fibromyalgia  . Hx of CABG  . Cough, persistent  . Paroxysmal atrial flutter  . Chest pain  . Atrial fibrillation  . Complete heart block-intermittent  . Pacemaker-stJudes  . CAD (coronary artery disease)  . Umbilical hernia  . Atypical chest pain    History  Smoking status  . Former Smoker --  1.0 packs/day for 10 years  . Types: Cigarettes  . Quit date: 09/21/1973  Smokeless tobacco  . Never Used    History  Alcohol Use No    Family History  Problem Relation Age of Onset  . Stroke Mother   . Heart attack Father     Review of Systems: Constitutional: no fever chills diaphoresis or fatigue or change in weight.  Head and neck: no hearing loss, no epistaxis, no photophobia or visual disturbance. Respiratory: No cough, shortness of breath or wheezing. Cardiovascular: No chest pain peripheral edema, palpitations. Gastrointestinal: No abdominal distention, no abdominal pain, no change in bowel habits hematochezia or melena. Genitourinary: No dysuria, no frequency, no urgency, no nocturia. Musculoskeletal:No arthralgias, no back pain, no gait disturbance or myalgias. Neurological: No dizziness, no headaches, no numbness, no seizures, no syncope, no weakness, no tremors. Hematologic: No lymphadenopathy, no easy bruising. Psychiatric: No confusion, no hallucinations, no sleep disturbance.    Physical Exam: Filed Vitals:   05/14/12 1442  BP: 132/68  Pulse: 71  Resp: 18   the general appearance reveals an anxious elderly gentleman in no acute distress.The head and neck exam reveals pupils equal and reactive.  Extraocular movements are full.  There is no scleral icterus.  The mouth and pharynx are normal.  The neck is supple.  The carotids reveal no bruits.  The jugular venous pressure is normal.  The  thyroid is not enlarged.  There is no lymphadenopathy.  The chest is clear to percussion and auscultation.  There are no rales or rhonchi.  Expansion of the chest is symmetrical.  The precordium is quiet.  The first heart sound is normal.  The second heart sound is physiologically split.  There is no murmur gallop rub or click.  There is no abnormal lift or heave.  The abdomen is soft and nontender.  The bowel sounds are normal.  The liver and spleen are not enlarged.  There are no  abdominal masses.  There are no abdominal bruits.  Extremities reveal good pedal pulses.  There is no phlebitis or edema.  There is no cyanosis or clubbing.  Strength is normal and symmetrical in all extremities.  There is no lateralizing weakness.  There are no sensory deficits.  The skin is warm and dry.  There is no rash.  EKG shows paced ventricular rhythm with underlying atrial fib   Assessment / Plan: Plan is to proceed with elective direct current cardioversion on 05/25/12 at 1 PM.  We will ask of the [employer]. Jude pacemaker representative to be there  to check his pacemaker postoperatively.

## 2012-05-15 ENCOUNTER — Telehealth: Payer: Self-pay | Admitting: *Deleted

## 2012-05-15 ENCOUNTER — Ambulatory Visit: Payer: Self-pay | Admitting: Cardiology

## 2012-05-15 ENCOUNTER — Other Ambulatory Visit: Payer: Self-pay

## 2012-05-15 NOTE — Telephone Encounter (Signed)
Advised patient of lab results  

## 2012-05-15 NOTE — Telephone Encounter (Signed)
Message copied by Burnell Blanks on Tue May 15, 2012 11:03 AM ------      Message from: Cassell Clement      Created: Mon May 14, 2012  8:40 PM       Please report to patient.  The recent labs are stable. Continue same medication and careful diet.

## 2012-05-21 ENCOUNTER — Other Ambulatory Visit: Payer: Self-pay | Admitting: *Deleted

## 2012-05-21 DIAGNOSIS — Z79899 Other long term (current) drug therapy: Secondary | ICD-10-CM

## 2012-05-22 ENCOUNTER — Other Ambulatory Visit (INDEPENDENT_AMBULATORY_CARE_PROVIDER_SITE_OTHER): Payer: Medicare Other

## 2012-05-22 DIAGNOSIS — Z79899 Other long term (current) drug therapy: Secondary | ICD-10-CM

## 2012-05-22 LAB — CBC WITH DIFFERENTIAL/PLATELET
Basophils Relative: 0.3 % (ref 0.0–3.0)
Eosinophils Absolute: 0.1 10*3/uL (ref 0.0–0.7)
Lymphocytes Relative: 32.9 % (ref 12.0–46.0)
MCHC: 32.5 g/dL (ref 30.0–36.0)
Neutrophils Relative %: 58.1 % (ref 43.0–77.0)
RBC: 4.82 Mil/uL (ref 4.22–5.81)
WBC: 6.1 10*3/uL (ref 4.5–10.5)

## 2012-05-22 LAB — BASIC METABOLIC PANEL
CO2: 30 mEq/L (ref 19–32)
Calcium: 8.7 mg/dL (ref 8.4–10.5)
Creatinine, Ser: 1.2 mg/dL (ref 0.4–1.5)

## 2012-05-22 LAB — PROTIME-INR
INR: 2.6 ratio — ABNORMAL HIGH (ref 0.8–1.0)
Prothrombin Time: 26.8 s — ABNORMAL HIGH (ref 10.2–12.4)

## 2012-05-22 LAB — APTT: aPTT: 38.9 s — ABNORMAL HIGH (ref 21.7–28.8)

## 2012-05-22 NOTE — Progress Notes (Signed)
Quick Note:  Please report to patient. The recent labs are stable. Continue same medication and careful diet. Labs satisfactory for cardioversion Friday. ______ 

## 2012-05-24 ENCOUNTER — Telehealth: Payer: Self-pay | Admitting: Cardiology

## 2012-05-24 NOTE — Telephone Encounter (Signed)
Advised patient of lab results  

## 2012-05-24 NOTE — Telephone Encounter (Signed)
Pt to have procedure tomorrow, had coumadin 05-22-12, hasn't heard back from [place] as to if level ok to proceed , pls call  [phone]

## 2012-05-24 NOTE — Telephone Encounter (Signed)
Message copied by Burnell Blanks on Thu May 24, 2012 12:53 PM ------      Message from: Cassell Clement      Created: Tue May 22, 2012  5:30 PM       Please report to patient.  The recent labs are stable. Continue same medication and careful diet. Labs satisfactory for cardioversion Friday.

## 2012-05-25 ENCOUNTER — Encounter (HOSPITAL_COMMUNITY): Payer: Self-pay | Admitting: Anesthesiology

## 2012-05-25 ENCOUNTER — Encounter (HOSPITAL_COMMUNITY)
Admission: RE | Disposition: A | Discharge status: Home Health | Payer: Self-pay | Source: Ambulatory Visit | Attending: Cardiology

## 2012-05-25 ENCOUNTER — Encounter (HOSPITAL_COMMUNITY): Payer: Self-pay

## 2012-05-25 ENCOUNTER — Ambulatory Visit (HOSPITAL_COMMUNITY): Payer: Medicare Other | Admitting: Anesthesiology

## 2012-05-25 ENCOUNTER — Ambulatory Visit (HOSPITAL_COMMUNITY)
Admission: RE | Admit: 2012-05-25 | Discharge: 2012-05-25 | Disposition: A | Discharge status: Home Health | Payer: Medicare Other | Source: Ambulatory Visit | Attending: Cardiology | Admitting: Cardiology

## 2012-05-25 DIAGNOSIS — I10 Essential (primary) hypertension: Secondary | ICD-10-CM | POA: Insufficient documentation

## 2012-05-25 DIAGNOSIS — Z01818 Encounter for other preprocedural examination: Secondary | ICD-10-CM

## 2012-05-25 DIAGNOSIS — I4891 Unspecified atrial fibrillation: Secondary | ICD-10-CM | POA: Insufficient documentation

## 2012-05-25 HISTORY — PX: CARDIOVERSION: SHX1299

## 2012-05-25 SURGERY — CARDIOVERSION
Anesthesia: Monitor Anesthesia Care

## 2012-05-25 MED ORDER — LIDOCAINE HCL (CARDIAC) 20 MG/ML IV SOLN
INTRAVENOUS | Status: DC | PRN
  Administered 2012-05-25: 55 mg via INTRAVENOUS

## 2012-05-25 MED ORDER — DEXTROSE-NACL 5-0.45 % IV SOLN
INTRAVENOUS | Status: DC
  Administered 2012-05-25: 500 mL via INTRAVENOUS

## 2012-05-25 MED ORDER — SODIUM CHLORIDE 0.9 % IV SOLN
INTRAVENOUS | Status: DC | PRN
  Administered 2012-05-25: 14:00:00 via INTRAVENOUS

## 2012-05-25 MED ORDER — PROPOFOL 10 MG/ML IV BOLUS
INTRAVENOUS | Status: DC | PRN
  Administered 2012-05-25: 55 mg via INTRAVENOUS

## 2012-05-25 NOTE — Anesthesia Postprocedure Evaluation (Signed)
  Anesthesia Post-op Note  Patient: [name]  Procedure(s) Performed: Procedure(s) (LRB) with comments: CARDIOVERSION (N/A)  Patient Location: PACU and Endoscopy Unit  Anesthesia Type:General  Level of Consciousness: awake, oriented and patient cooperative  Airway and Oxygen Therapy: Patient Spontanous Breathing  Post-op Pain: none  Post-op Assessment: Post-op Vital signs reviewed, Patient's Cardiovascular Status Stable, Respiratory Function Stable, Patent Airway, No signs of Nausea or vomiting and Pain level controlled  Post-op Vital Signs: stable  Complications: No apparent anesthesia complications

## 2012-05-25 NOTE — H&P (Signed)
History of Present Illness:  This pleasant 72 year old gentleman is seen for a pre-cardioversion office visit. He was recently admitted to [facility] on 03/09/12 and discharged on 03/11/12 he was admitted after he complained of having some chest discomfort at the time of a routine pacemaker check up. He was found to be in atrial fibrillation at the time of admission. On the date of discharge 11//13 the patient underwent a Lexus scan Myoview stress test which revealed left ventricular ejection fraction 32% with septal and inferior wall motion abnormalities and with an inferior lateral scar but no evidence of reversible ischemia. His prior ejection fraction at the time of catheter in 2012 was approximately 40%. 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 His last cardiac catheterization in October 2012 showed that his grafts were patent. The patient also has a history of hypercholesterolemia and essential hypertension. He has had a prior history of paroxysmal atrial flutter and he has an underlying St. Jude pacemaker which was implanted in 2012. The patient is on long-term Coumadin. He has a lot of anxiety. The patient has had 4 consecutive weekly INRs which have been therapeutic.   the general appearance reveals an anxious elderly gentleman in no acute distress.The head and neck exam reveals pupils equal and reactive. Extraocular movements are full. There is no scleral icterus. The mouth and pharynx are normal. The neck is supple. The carotids reveal no bruits. The jugular venous pressure is normal. The thyroid is not enlarged. There is no lymphadenopathy. The chest is clear to percussion and auscultation. There are no rales or rhonchi. Expansion of the chest is symmetrical. The precordium is quiet. The first heart sound is normal. The second heart sound is physiologically split. There is no murmur gallop rub or click. There is no abnormal lift or heave. The abdomen is soft and  nontender. The bowel sounds are normal. The liver and spleen are not enlarged. There are no abdominal masses. There are no abdominal bruits. Extremities reveal good pedal pulses. There is no phlebitis or edema. There is no cyanosis or clubbing. Strength is normal and symmetrical in all extremities. There is no lateralizing weakness. There are no sensory deficits. The skin is warm and dry. There is no rash.  EKG shows paced ventricular rhythm with underlying atrial fib  Plan: Elective DCCV on 05/25/12

## 2012-05-25 NOTE — Transfer of Care (Signed)
Immediate Anesthesia Transfer of Care Note  Patient: [name]  Procedure(s) Performed: Procedure(s) (LRB) with comments: CARDIOVERSION (N/A)  Patient Location: PACU and Endoscopy Unit  Anesthesia Type:General  Level of Consciousness: awake, alert  and oriented  Airway & Oxygen Therapy: Patient Spontanous Breathing and Patient connected to nasal cannula oxygen  Post-op Assessment: Report given to PACU RN, Post -op Vital signs reviewed and stable and Patient moving all extremities X 4  Post vital signs: Reviewed and stable  Complications: No apparent anesthesia complications

## 2012-05-25 NOTE — Anesthesia Postprocedure Evaluation (Signed)
  Anesthesia Post-op Note  Patient: [name]  Procedure(s) Performed: Procedure(s) (LRB) with comments: CARDIOVERSION (N/A)  Patient Location: PACU and Endoscopy Unit  Anesthesia Type:General  Level of Consciousness: awake, alert  and oriented  Airway and Oxygen Therapy: Patient Spontanous Breathing and Patient connected to nasal cannula oxygen  Post-op Pain: none  Post-op Assessment: Post-op Vital signs reviewed, Patient's Cardiovascular Status Stable, Respiratory Function Stable, RESPIRATORY FUNCTION UNSTABLE and Patent Airway  Post-op Vital Signs: Reviewed and stable  Complications: No apparent anesthesia complications

## 2012-05-25 NOTE — CV Procedure (Signed)
Electrical Cardioversion Procedure Note [name] [id number] [birth year]  Procedure: Electrical Cardioversion Indications:  Atrial Fibrillation  Procedure Details Consent: Risks of procedure as well as the alternatives and risks of each were explained to the (patient/caregiver).  Consent for procedure obtained. Time Out: Verified patient identification, verified procedure, site/side was marked, verified correct patient position, special equipment/implants available, medications/allergies/relevent history reviewed, required imaging and test results available.  Performed  Patient placed on cardiac monitor, pulse oximetry, supplemental oxygen as necessary.  Sedation given: propoful Pacer pads placed anterior and posterior chest.  Cardioverted 1 time(s).  Cardioverted at 120J.  Evaluation Findings: Post procedure EKG shows: NSR Complications: None Patient did tolerate procedure well.   [name] 05/25/2012, 1:57 PM

## 2012-05-25 NOTE — Preoperative (Signed)
Beta Blockers   Reason not to administer Beta Blockers:Not Applicable 

## 2012-05-25 NOTE — Anesthesia Preprocedure Evaluation (Addendum)
Anesthesia Evaluation  Patient identified by MRN, date of birth, ID band Patient awake    Reviewed: Allergy & Precautions, H&P , NPO status , Patient's Chart, lab work & pertinent test results  Airway Mallampati: I TM Distance: >3 FB Neck ROM: full    Dental   Pulmonary          Cardiovascular hypertension, Pt. on medications + angina + CAD + dysrhythmias Atrial Fibrillation + pacemaker Rhythm:irregular Rate:Normal     Neuro/Psych Anxiety Depression    GI/Hepatic hiatal hernia, GERD-  Controlled and Medicated,  Endo/Other    Renal/GU Renal diseasenegative Renal ROS     Musculoskeletal  (+) Fibromyalgia -  Abdominal   Peds  Hematology   Anesthesia Other Findings   Reproductive/Obstetrics                         Anesthesia Physical Anesthesia Plan  ASA: IV  Anesthesia Plan: General   Post-op Pain Management:    Induction: Intravenous  Airway Management Planned: Mask  Additional Equipment:   Intra-op Plan:   Post-operative Plan:   Informed Consent: I have reviewed the patients History and Physical, chart, labs and discussed the procedure including the risks, benefits and alternatives for the proposed anesthesia with the patient or authorized representative who has indicated his/her understanding and acceptance.   Dental advisory given  Plan Discussed with: CRNA and Anesthesiologist  Anesthesia Plan Comments:        Anesthesia Quick Evaluation

## 2012-05-28 ENCOUNTER — Encounter (HOSPITAL_COMMUNITY): Payer: Self-pay | Admitting: Cardiology

## 2012-05-30 ENCOUNTER — Other Ambulatory Visit: Payer: Self-pay | Admitting: Cardiology

## 2012-05-30 MED ORDER — ATORVASTATIN CALCIUM 40 MG PO TABS: 40.0000 mg | ORAL_TABLET | Freq: Every day | ORAL | Status: DC

## 2012-05-30 NOTE — Telephone Encounter (Signed)
New Problem:    Patient called in needing a 90 day refill of his atorvastatin (LIPITOR) 40 MG tablet sent to the pharmacy listed on his profile.

## 2012-05-31 ENCOUNTER — Encounter: Payer: Self-pay | Admitting: Cardiology

## 2012-05-31 ENCOUNTER — Ambulatory Visit (INDEPENDENT_AMBULATORY_CARE_PROVIDER_SITE_OTHER): Payer: Medicare Other | Admitting: Cardiology

## 2012-05-31 VITALS — BP 140/84 | HR 73 | Ht 66.0 in | Wt 194.8 lb

## 2012-05-31 DIAGNOSIS — I255 Ischemic cardiomyopathy: Secondary | ICD-10-CM

## 2012-05-31 DIAGNOSIS — F419 Anxiety disorder, unspecified: Secondary | ICD-10-CM

## 2012-05-31 DIAGNOSIS — I2510 Atherosclerotic heart disease of native coronary artery without angina pectoris: Secondary | ICD-10-CM

## 2012-05-31 DIAGNOSIS — F411 Generalized anxiety disorder: Secondary | ICD-10-CM

## 2012-05-31 DIAGNOSIS — I4891 Unspecified atrial fibrillation: Secondary | ICD-10-CM

## 2012-05-31 DIAGNOSIS — I2589 Other forms of chronic ischemic heart disease: Secondary | ICD-10-CM

## 2012-05-31 DIAGNOSIS — I480 Paroxysmal atrial fibrillation: Secondary | ICD-10-CM

## 2012-05-31 MED ORDER — ALPRAZOLAM 1 MG PO TABS: 1.0000 mg | ORAL_TABLET | Freq: Three times a day (TID) | ORAL | Status: DC | PRN

## 2012-05-31 MED ORDER — DOXEPIN HCL 25 MG PO CAPS: 25.0000 mg | ORAL_CAPSULE | Freq: Three times a day (TID) | ORAL | Status: DC

## 2012-05-31 NOTE — Assessment & Plan Note (Signed)
With this history of failure to maintain normal sinus rhythm following cardioversion, we will not make any further attempts at cardioversion.  We will pursue rate control and continue long-term warfarin.

## 2012-05-31 NOTE — Assessment & Plan Note (Signed)
The patient has not been experiencing any chest pain or angina pectoris.  He is anxious to get back on his stationary bicycle and they will be fine for him to use that now.

## 2012-05-31 NOTE — Telephone Encounter (Signed)
Called patient and no answer so [facility] to make sure [facility] received refill of atorvastatin (lipitor) 40 MG and they did and said patient had already picked up medication.

## 2012-05-31 NOTE — Progress Notes (Signed)
[name] Date of Birth:  [birth year] [facility] [address] [place], [place]  [address] [phone]         Fax   [phone]  History of Present Illness: This pleasant 72 year old gentleman is seen for a post -cardioversion office visit. He was recently admitted to [facility] on 03/09/12 and discharged on 03/11/12 he was admitted after he complained of having some chest discomfort at the time of a routine pacemaker check up. He was found to be in atrial fibrillation at the time of admission. On the date of discharge 11//13 the patient underwent a Lexus scan Myoview stress test which revealed left ventricular ejection fraction 32% with septal and inferior wall motion abnormalities and with an inferior lateral scar but no evidence of reversible ischemia. His prior ejection fraction at the time of catheter in 2012 was approximately 40%. 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 His last cardiac catheterization in October 2012 showed that his grafts were patent. The patient also has a history of hypercholesterolemia and essential hypertension. He has had a prior history of paroxysmal atrial flutter with ablation therapy and subsequent heart block which required a [employer] pacemaker which was implanted in 2012. The patient is on long-term Coumadin. He has a lot of anxiety.  On 05/25/12 the patient underwent elective cardioversion which was successful.  Normal sinus rhythm and was returned as was confirmed by intracardiac electrogram via his pacemaker.  However, within 24 hours after the cardioversion he felt that his pulse was irregular again.  EKG today confirms that he is back in atrial flutter fibrillation.  He was essentially asymptomatic and states that the arrhythmia doesn't really bother him much.  Current Outpatient Prescriptions  Medication Sig Dispense Refill  . acetaminophen (TYLENOL) 325 MG tablet Take 650 mg by mouth 2 (two) times  daily as needed. pain      . ALPRAZolam (XANAX) 1 MG tablet Take 1 mg by mouth 3 (three) times daily as needed. For anxiety      . aspirin EC 81 MG EC tablet Take 1 tablet (81 mg total) by mouth daily.      [name] Kitchen atorvastatin (LIPITOR) 40 MG tablet Take 1 tablet (40 mg total) by mouth daily.  45 tablet  6  . carvedilol (COREG) 12.5 MG tablet Take 0.5 tablets (6.25 mg total) by mouth 2 (two) times daily with a meal.  180 tablet  3  . doxepin (SINEQUAN) 25 MG capsule Take 25 mg by mouth 3 (three) times daily.        [name] Kitchen gabapentin (NEURONTIN) 100 MG capsule Take 200 mg by mouth 2 (two) times daily.       . hydroxypropyl methylcellulose (ISOPTO TEARS) 2.5 % ophthalmic solution Place 1 drop into both eyes 4 (four) times daily as needed. Dry eyes      . isosorbide mononitrate (IMDUR) 60 MG 24 hr tablet Take 1 tablet (60 mg total) by mouth daily.  90 tablet  3  . lisinopril (PRINIVIL,ZESTRIL) 5 MG tablet Take 1 tablet (5 mg total) by mouth daily.  30 tablet  3  . Multiple Vitamin (MULTIVITAMIN) tablet Take 1 tablet by mouth daily.        . nitroGLYCERIN (NITROSTAT) 0.4 MG SL tablet Place 0.4 mg under the tongue every 5 (five) minutes as needed. For chest pain      . omeprazole (PRILOSEC) 20 MG capsule Take 1 capsule (20 mg  total) by mouth 2 (two) times daily.  180 capsule  3  . sennosides-docusate sodium (SENOKOT-S) 8.6-50 MG tablet Take 1 tablet by mouth daily.      [name] Kitchen warfarin (COUMADIN) 5 MG tablet Take 5 mg by mouth daily. 7.5mg   on Monday, Wednesday, and Friday all other days pt takes 1 tablet (5 mg)        Allergies  Allergen Reactions  . Sulfa Drugs Cross Reactors Other (See Comments)    unknown  . Ultram (Tramadol Hcl) Itching    "don't remember how bad"  . Xarelto (Rivaroxaban) Other (See Comments)    Pt doesn't remember the reaction but physician told to stop taking it.    Patient Active Problem List  Diagnosis  . Ischemic cardiomyopathy  . Hypertension  . Hyperlipidemia  . GERD  (gastroesophageal reflux disease)  . Anxiety  . Fibromyalgia  . Hx of CABG  . Cough, persistent  . Paroxysmal atrial flutter  . Chest pain  . Atrial fibrillation  . Complete heart block-intermittent  . Pacemaker-stJudes  . CAD (coronary artery disease)  . Umbilical hernia  . Atypical chest pain    History  Smoking status  . Former Smoker -- 1.0 packs/day for 10 years  . Types: Cigarettes  . Quit date: 09/21/1973  Smokeless tobacco  . Never Used    History  Alcohol Use No    Family History  Problem Relation Age of Onset  . Stroke Mother   . Heart attack Father     Review of Systems: Constitutional: no fever chills diaphoresis or fatigue or change in weight.  Head and neck: no hearing loss, no epistaxis, no photophobia or visual disturbance. Respiratory: No cough, shortness of breath or wheezing. Cardiovascular: No chest pain peripheral edema, palpitations. Gastrointestinal: No abdominal distention, no abdominal pain, no change in bowel habits hematochezia or melena. Genitourinary: No dysuria, no frequency, no urgency, no nocturia. Musculoskeletal:No arthralgias, no back pain, no gait disturbance or myalgias. Neurological: No dizziness, no headaches, no numbness, no seizures, no syncope, no weakness, no tremors. Hematologic: No lymphadenopathy, no easy bruising. Psychiatric: No confusion, no hallucinations, no sleep disturbance.    Physical Exam: Filed Vitals:   05/31/12 1330  BP: 140/84  Pulse: 73   the general appearance reveals a well-developed well-nourished talkative gentleman in no distress.  He is chronically anxious.The head and neck exam reveals pupils equal and reactive.  Extraocular movements are full.  There is no scleral icterus.  The mouth and pharynx are normal.  The neck is supple.  The carotids reveal no bruits.  The jugular venous pressure is normal.  The  thyroid is not enlarged.  There is no lymphadenopathy.  The chest is clear to percussion and  auscultation.  There are no rales or rhonchi.  Expansion of the chest is symmetrical.  The precordium is quiet.  The first heart sound is normal.  The second heart sound is physiologically split.  There is no murmur gallop rub or click.  There is no abnormal lift or heave.  The abdomen is soft and nontender.  The bowel sounds are normal.  The liver and spleen are not enlarged.  There are no abdominal masses.  There are no abdominal bruits.  Extremities reveal good pedal pulses.  There is no phlebitis or edema.  There is no cyanosis or clubbing.  Strength is normal and symmetrical in all extremities.  There is no lateralizing weakness.  There are no sensory deficits.  The skin is warm  and dry.  There is no rash.  EKG shows paced ventricular rhythm with underlying atrial fibrillation   Assessment / Plan: Continue present medication.  Return soon for echocardiogram.  Try to lose weight.  Recheck in 2 months for followup office visit and EKG

## 2012-05-31 NOTE — Assessment & Plan Note (Signed)
The patient is not having symptoms of CHF from his ischemic cardiomyopathy.  He has not had a recent echocardiogram and we will update his echo to look at his left ventricular function and left atrial size

## 2012-05-31 NOTE — Patient Instructions (Signed)
Ok to use stationary bike at home  Your physician has requested that you have an echocardiogram. Echocardiography is a painless test that uses sound waves to create images of your heart. It provides your doctor with information about the size and shape of your heart and how well your heart's chambers and valves are working. This procedure takes approximately one hour. There are no restrictions for this procedure.  Work harder on losing weight   Your physician recommends that you schedule a follow-up appointment in: 2 month ov/ekg

## 2012-06-07 ENCOUNTER — Ambulatory Visit (HOSPITAL_COMMUNITY): Payer: Medicare Other | Attending: Cardiology | Admitting: Radiology

## 2012-06-07 DIAGNOSIS — R079 Chest pain, unspecified: Secondary | ICD-10-CM | POA: Insufficient documentation

## 2012-06-07 DIAGNOSIS — I2589 Other forms of chronic ischemic heart disease: Secondary | ICD-10-CM | POA: Insufficient documentation

## 2012-06-07 DIAGNOSIS — E785 Hyperlipidemia, unspecified: Secondary | ICD-10-CM | POA: Insufficient documentation

## 2012-06-07 DIAGNOSIS — F411 Generalized anxiety disorder: Secondary | ICD-10-CM | POA: Insufficient documentation

## 2012-06-07 DIAGNOSIS — Z87891 Personal history of nicotine dependence: Secondary | ICD-10-CM | POA: Insufficient documentation

## 2012-06-07 DIAGNOSIS — I10 Essential (primary) hypertension: Secondary | ICD-10-CM | POA: Insufficient documentation

## 2012-06-07 DIAGNOSIS — I480 Paroxysmal atrial fibrillation: Secondary | ICD-10-CM

## 2012-06-07 DIAGNOSIS — I080 Rheumatic disorders of both mitral and aortic valves: Secondary | ICD-10-CM | POA: Insufficient documentation

## 2012-06-07 DIAGNOSIS — I2510 Atherosclerotic heart disease of native coronary artery without angina pectoris: Secondary | ICD-10-CM | POA: Insufficient documentation

## 2012-06-07 DIAGNOSIS — I4891 Unspecified atrial fibrillation: Secondary | ICD-10-CM | POA: Insufficient documentation

## 2012-06-07 NOTE — Progress Notes (Signed)
Echocardiogram performed.  

## 2012-06-11 ENCOUNTER — Encounter: Payer: Medicare Other | Admitting: *Deleted

## 2012-06-12 ENCOUNTER — Telehealth: Payer: Self-pay | Admitting: *Deleted

## 2012-06-12 NOTE — Telephone Encounter (Signed)
Advised patient

## 2012-06-12 NOTE — Telephone Encounter (Signed)
Message copied by Burnell Blanks on Tue Jun 12, 2012  5:56 PM ------      Message from: Cassell Clement      Created: Thu Jun 07, 2012  9:30 PM       Please report.  His EF is not as good.  Now 25%.  We need to increase his meds to help his EF. Increase carvedilol to a whole 12.5 mg tab BID.  Increase lisinopril 5 mg to BID. Recheck him in about a month for OV. ------

## 2012-06-16 ENCOUNTER — Encounter: Payer: Self-pay | Admitting: *Deleted

## 2012-06-18 ENCOUNTER — Telehealth: Payer: Self-pay | Admitting: Cardiology

## 2012-06-18 ENCOUNTER — Encounter: Payer: Self-pay | Admitting: *Deleted

## 2012-06-18 NOTE — Telephone Encounter (Signed)
New problem    Coreg  12.5 mg twice a day   Lisinopril  5 mg. Twice a day    [facility]  [phone].

## 2012-06-19 ENCOUNTER — Telehealth: Payer: Self-pay | Admitting: Cardiology

## 2012-06-19 MED ORDER — CARVEDILOL 12.5 MG PO TABS: 12.5000 mg | ORAL_TABLET | Freq: Two times a day (BID) | ORAL | Status: DC

## 2012-06-19 MED ORDER — LISINOPRIL 5 MG PO TABS: 5.0000 mg | ORAL_TABLET | Freq: Two times a day (BID) | ORAL | Status: DC

## 2012-06-19 NOTE — Telephone Encounter (Signed)
Follow-up:    Patient called in to follow-up on his call from yesterday.

## 2012-06-19 NOTE — Telephone Encounter (Signed)
New problem    1. C/o blood pressure  On today  Left  80 /44 @ 8:30 . Right arm  90/49 @ 8:30.   2.  Also need to clarification direction & dosage .

## 2012-06-19 NOTE — Telephone Encounter (Signed)
Pt was taking lisinopril and carvedilol both were doubled to two a day, Needs 30 day supply, the drug store in [place], pt out now since these were increased can't get refill

## 2012-06-20 NOTE — Telephone Encounter (Signed)
The present dose of carvedilol (12.5 mg) may be a little too strong for him.  Decrease the carvedilol to 6.25 mg twice a day.

## 2012-06-20 NOTE — Telephone Encounter (Signed)
Advised patient

## 2012-06-20 NOTE — Telephone Encounter (Signed)
Spoke with patient and blood pressure went up to 122 yesterday afternoon.  This am he took 1/2 Carvedilol and blood pressure was 105/53 and 97/53 about an hour later.  106/57 and 103/56 a couple hours after meds, three hours after 113/65 and 112/59 so he took the other half of Carvedilol.  Has been feeling tired.  Yesterday after walking on treadmill for 15 minutes heart rate 69. Heart rate today just walking into the kitchen heart rate up to 90.  Will forward to  Dr. Patty Sermons for review

## 2012-06-21 ENCOUNTER — Other Ambulatory Visit: Payer: Medicare Other

## 2012-06-21 ENCOUNTER — Ambulatory Visit: Payer: Medicare Other | Admitting: Cardiology

## 2012-06-25 ENCOUNTER — Encounter: Payer: Self-pay | Admitting: Internal Medicine

## 2012-06-25 ENCOUNTER — Ambulatory Visit (INDEPENDENT_AMBULATORY_CARE_PROVIDER_SITE_OTHER): Payer: Medicare Other | Admitting: *Deleted

## 2012-06-25 ENCOUNTER — Telehealth: Payer: Self-pay | Admitting: Internal Medicine

## 2012-06-25 ENCOUNTER — Other Ambulatory Visit: Payer: Self-pay

## 2012-06-25 DIAGNOSIS — I442 Atrioventricular block, complete: Secondary | ICD-10-CM

## 2012-06-25 DIAGNOSIS — Z950 Presence of cardiac pacemaker: Secondary | ICD-10-CM

## 2012-06-25 LAB — REMOTE PACEMAKER DEVICE
AL AMPLITUDE: 5 mv
AL IMPEDENCE PM: 410 Ohm
ATRIAL PACING PM: 6.3
BAMS-0001: 150 {beats}/min
BAMS-0003: 70 {beats}/min
BATTERY VOLTAGE: 2.95 V
BRDY-0002RV: 60 {beats}/min
BRDY-0003RV: 120 {beats}/min
BRDY-0004RV: 120 {beats}/min
DEVICE MODEL PM: 7282780
RV LEAD AMPLITUDE: 12 mv
RV LEAD IMPEDENCE PM: 580 Ohm
RV LEAD THRESHOLD: 0.75 V
VENTRICULAR PACING PM: 100

## 2012-06-25 NOTE — Telephone Encounter (Signed)
Transmission received. Spoke w/pt and pt aware.

## 2012-06-25 NOTE — Telephone Encounter (Signed)
Pt wants to know if remote came through today, tried last night, didn't go through

## 2012-07-02 ENCOUNTER — Telehealth: Payer: Self-pay | Admitting: Cardiology

## 2012-07-02 NOTE — Telephone Encounter (Signed)
Burning in throat, choking feeling, was told to call heart dr, pls advise

## 2012-07-02 NOTE — Telephone Encounter (Signed)
Pt states that his gastroenterology office advised him to double his Omeprazole X 2 weeks and to f/u with cardiology. Pt has f/u with Dr. Patty Sermons on 3/13.

## 2012-07-09 ENCOUNTER — Encounter (HOSPITAL_COMMUNITY): Payer: Self-pay | Admitting: *Deleted

## 2012-07-09 ENCOUNTER — Emergency Department (HOSPITAL_COMMUNITY)
Admission: EM | Admit: 2012-07-09 | Discharge: 2012-07-09 | Disposition: A | Discharge status: Home / Self Care | Payer: Medicare Other | Attending: Emergency Medicine | Admitting: Emergency Medicine

## 2012-07-09 ENCOUNTER — Emergency Department (HOSPITAL_COMMUNITY): Payer: Medicare Other

## 2012-07-09 ENCOUNTER — Telehealth: Payer: Self-pay | Admitting: Cardiology

## 2012-07-09 DIAGNOSIS — Z950 Presence of cardiac pacemaker: Secondary | ICD-10-CM | POA: Insufficient documentation

## 2012-07-09 DIAGNOSIS — Z7982 Long term (current) use of aspirin: Secondary | ICD-10-CM | POA: Insufficient documentation

## 2012-07-09 DIAGNOSIS — Z87891 Personal history of nicotine dependence: Secondary | ICD-10-CM | POA: Insufficient documentation

## 2012-07-09 DIAGNOSIS — I2510 Atherosclerotic heart disease of native coronary artery without angina pectoris: Secondary | ICD-10-CM | POA: Insufficient documentation

## 2012-07-09 DIAGNOSIS — Z7901 Long term (current) use of anticoagulants: Secondary | ICD-10-CM | POA: Insufficient documentation

## 2012-07-09 DIAGNOSIS — F329 Major depressive disorder, single episode, unspecified: Secondary | ICD-10-CM | POA: Insufficient documentation

## 2012-07-09 DIAGNOSIS — K219 Gastro-esophageal reflux disease without esophagitis: Secondary | ICD-10-CM

## 2012-07-09 DIAGNOSIS — F3289 Other specified depressive episodes: Secondary | ICD-10-CM | POA: Insufficient documentation

## 2012-07-09 DIAGNOSIS — F411 Generalized anxiety disorder: Secondary | ICD-10-CM | POA: Insufficient documentation

## 2012-07-09 DIAGNOSIS — R1013 Epigastric pain: Secondary | ICD-10-CM | POA: Insufficient documentation

## 2012-07-09 DIAGNOSIS — Z8719 Personal history of other diseases of the digestive system: Secondary | ICD-10-CM | POA: Insufficient documentation

## 2012-07-09 DIAGNOSIS — Z951 Presence of aortocoronary bypass graft: Secondary | ICD-10-CM | POA: Insufficient documentation

## 2012-07-09 DIAGNOSIS — Z8739 Personal history of other diseases of the musculoskeletal system and connective tissue: Secondary | ICD-10-CM | POA: Insufficient documentation

## 2012-07-09 DIAGNOSIS — E785 Hyperlipidemia, unspecified: Secondary | ICD-10-CM | POA: Insufficient documentation

## 2012-07-09 DIAGNOSIS — Z79899 Other long term (current) drug therapy: Secondary | ICD-10-CM | POA: Insufficient documentation

## 2012-07-09 DIAGNOSIS — Z8679 Personal history of other diseases of the circulatory system: Secondary | ICD-10-CM | POA: Insufficient documentation

## 2012-07-09 DIAGNOSIS — Z87448 Personal history of other diseases of urinary system: Secondary | ICD-10-CM | POA: Insufficient documentation

## 2012-07-09 DIAGNOSIS — I10 Essential (primary) hypertension: Secondary | ICD-10-CM | POA: Insufficient documentation

## 2012-07-09 LAB — CBC WITH DIFFERENTIAL/PLATELET
Basophils Absolute: 0 10*3/uL (ref 0.0–0.1)
Basophils Relative: 0 % (ref 0–1)
HCT: 41.9 % (ref 39.0–52.0)
Hemoglobin: 14.2 g/dL (ref 13.0–17.0)
Lymphocytes Relative: 37 % (ref 12–46)
MCHC: 33.9 g/dL (ref 30.0–36.0)
Monocytes Absolute: 0.5 10*3/uL (ref 0.1–1.0)
Neutro Abs: 3.4 10*3/uL (ref 1.7–7.7)
Neutrophils Relative %: 53 % (ref 43–77)
RDW: 14 % (ref 11.5–15.5)
WBC: 6.4 10*3/uL (ref 4.0–10.5)

## 2012-07-09 LAB — COMPREHENSIVE METABOLIC PANEL
ALT: 15 U/L (ref 0–53)
AST: 20 U/L (ref 0–37)
Albumin: 3.3 g/dL — ABNORMAL LOW (ref 3.5–5.2)
Alkaline Phosphatase: 62 U/L (ref 39–117)
CO2: 24 mEq/L (ref 19–32)
Chloride: 98 mEq/L (ref 96–112)
GFR calc non Af Amer: 70 mL/min — ABNORMAL LOW (ref >90)
Potassium: 4.7 mEq/L (ref 3.5–5.1)
Total Bilirubin: 0.6 mg/dL (ref 0.3–1.2)

## 2012-07-09 MED ORDER — SUCRALFATE 1 G PO TABS
1.0000 g | ORAL_TABLET | Freq: Three times a day (TID) | ORAL | Status: DC
  Administered 2012-07-09: 1 g via ORAL
  Filled 2012-07-09 (×3): qty 1

## 2012-07-09 MED ORDER — GI COCKTAIL ~~LOC~~
30.0000 mL | Freq: Once | ORAL | Status: AC
  Administered 2012-07-09: 30 mL via ORAL
  Filled 2012-07-09: qty 30

## 2012-07-09 MED ORDER — SUCRALFATE 1 G PO TABS: 1.0000 g | ORAL_TABLET | Freq: Four times a day (QID) | ORAL | Status: DC

## 2012-07-09 NOTE — ED Notes (Signed)
Lab states that they will look into the PT-INR that was ordered. RN sent tube with initial labs.

## 2012-07-09 NOTE — ED Provider Notes (Signed)
History     CSN: [id number]  Arrival date & time 07/09/12  1333   First MD Initiated Contact with Patient 07/09/12 1508      Chief Complaint  Patient presents with  . Chest Pain    (Consider location/radiation/quality/duration/timing/severity/associated sxs/prior treatment) Patient is a 72 y.o. male presenting with chest pain. The history is provided by the patient.  Chest Pain  patient here complaining of 2 weeks of epigastric pain as constant and worse with eating and relieved with belching. Called his gastroenterologist and was told to increase his dose of Prilosec. No relief of symptoms with this. Denies any black or bloody stools. No vomiting but some nausea. No anginal type chest pain. Called his cardiologist was to come here for evaluation. Denies any fever or chills  Past Medical History  Diagnosis Date  . Ischemic cardiomyopathy     EF 40% by heart cath 01/2011   . Hypertension   . Hyperlipidemia   . GERD (gastroesophageal reflux disease)   . Anxiety   . CAD (coronary artery disease)     s/p CABGx4 in 1991, with redo surgery in 2007 with  SVG to an OM and sequential SVG to the PDA and PLV, recent cath 01/2011 with patent grafts  . Atrial flutter October 2012    S/P ablation - not successful  . CHB (complete heart block) October 2012    S/P PTVP following ablation  . Kidney cysts     CYST ON BOTH KIDNEYS  . Hiatal hernia   . Depression   . Other "heavy-for-dates" infants elevated PSA level  . A-fib   . Anginal pain   . Elevated PSA, less than 10 ng/ml 10/2011    "went from 5 to 9"  . Pacemaker   . Fibromyalgia     Past Surgical History  Procedure Laterality Date  . Coronary artery bypass graft  1991    CABG X 5  . Coronary artery bypass graft  06/2005    CABG X 3  . Cardiac catheterization  10/30/2009    Grafts patent. Mild to moderate LV dysfunction. EF 45%. Managed medically.   . Cardiac catheterization      multiple  . Tonsillectomy  1960  .  Cholecystectomy  2007  . Insert / replace / remove pacemaker  2012  . Inguinal hernia repair  1960's    "left I think"  . Cataract extraction w/ intraocular lens  implant, bilateral  2012  . Cardioversion  05/25/2012    Procedure: CARDIOVERSION;  Surgeon: Cassell Clement, MD;  Location[facility] ENDOSCOPY;  Service: Cardiovascular;  Laterality: N/A;    Family History  Problem Relation Age of Onset  . Stroke Mother   . Heart attack Father     History  Substance Use Topics  . Smoking status: Former Smoker -- 1.00 packs/day for 10 years    Types: Cigarettes    Quit date: 09/21/1973  . Smokeless tobacco: Never Used  . Alcohol Use: No      Review of Systems  Cardiovascular: Positive for chest pain.  All other systems reviewed and are negative.    Allergies  Sulfa drugs cross reactors; Ultram; and Xarelto  Home Medications   Current Outpatient Rx  Name  Route  Sig  Dispense  Refill  . acetaminophen (TYLENOL) 325 MG tablet   Oral   Take 650 mg by mouth 2 (two) times daily as needed. pain         . ALPRAZolam (XANAX) 1 MG  tablet   Oral   Take 1 tablet (1 mg total) by mouth 3 (three) times daily as needed. For anxiety   90 tablet   3   . aspirin EC 81 MG EC tablet   Oral   Take 1 tablet (81 mg total) by mouth daily.         [name] Kitchen atorvastatin (LIPITOR) 40 MG tablet   Oral   Take 1 tablet (40 mg total) by mouth daily.   45 tablet   6   . carvedilol (COREG) 12.5 MG tablet   Oral   Take 6.25 mg by mouth 2 (two) times daily with a meal. 1/2 tablet twice a day         . doxepin (SINEQUAN) 25 MG capsule   Oral   Take 25 mg by mouth 2 (two) times daily. Takes 1 tablet in am and 2 tablets in pm         . gabapentin (NEURONTIN) 100 MG capsule   Oral   Take 100 mg by mouth 2 (two) times daily.          . hydroxypropyl methylcellulose (ISOPTO TEARS) 2.5 % ophthalmic solution   Both Eyes   Place 1 drop into both eyes 4 (four) times daily as needed. Dry eyes          . isosorbide mononitrate (IMDUR) 60 MG 24 hr tablet   Oral   Take 1 tablet (60 mg total) by mouth daily.   90 tablet   3   . lisinopril (PRINIVIL,ZESTRIL) 5 MG tablet   Oral   Take 1 tablet (5 mg total) by mouth 2 (two) times daily.   60 tablet   5   . Multiple Vitamin (MULTIVITAMIN) tablet   Oral   Take 1 tablet by mouth daily.           [name] Kitchen omeprazole (PRILOSEC) 20 MG capsule   Oral   Take 40 mg by mouth 2 (two) times daily.         . sennosides-docusate sodium (SENOKOT-S) 8.6-50 MG tablet   Oral   Take 1 tablet by mouth daily.         [name] Kitchen warfarin (COUMADIN) 5 MG tablet   Oral   Take 5 mg by mouth daily.          . nitroGLYCERIN (NITROSTAT) 0.4 MG SL tablet   Sublingual   Place 0.4 mg under the tongue every 5 (five) minutes as needed. For chest pain           BP 127/71  Pulse 82  Temp(Src) 98 F (36.7 C) (Oral)  Resp 18  SpO2 98%  Physical Exam  Nursing note and vitals reviewed. Constitutional: He is oriented to person, place, and time. He appears well-developed and well-nourished.  Non-toxic appearance. No distress.  HENT:  Head: Normocephalic and atraumatic.  Eyes: Conjunctivae, EOM and lids are normal. Pupils are equal, round, and reactive to light.  Neck: Normal range of motion. Neck supple. No tracheal deviation present. No mass present.  Cardiovascular: Normal rate, regular rhythm and normal heart sounds.  Exam reveals no gallop.   No murmur heard. Pulmonary/Chest: Effort normal and breath sounds normal. No stridor. No respiratory distress. He has no decreased breath sounds. He has no wheezes. He has no rhonchi. He has no rales.  Abdominal: Soft. Normal appearance and bowel sounds are normal. He exhibits no distension. There is no tenderness. There is no rebound and no CVA tenderness.  Musculoskeletal: Normal  range of motion. He exhibits no edema and no tenderness.  Neurological: He is alert and oriented to person, place, and time. He has  normal strength. No cranial nerve deficit or sensory deficit. GCS eye subscore is 4. GCS verbal subscore is 5. GCS motor subscore is 6.  Skin: Skin is warm and dry. No abrasion and no rash noted.  Psychiatric: He has a normal mood and affect. His speech is normal and behavior is normal.    ED Course  Procedures (including critical care time)  Labs Reviewed  COMPREHENSIVE METABOLIC PANEL - Abnormal; Notable for the following:    Sodium 131 (*)    Glucose, Bld 117 (*)    Calcium 8.2 (*)    Albumin 3.3 (*)    GFR calc non Af Amer 70 (*)    GFR calc Af Amer 81 (*)    All other components within normal limits  CBC WITH DIFFERENTIAL  POCT I-STAT TROPONIN I   Dg Chest 2 View  07/09/2012  *RADIOLOGY REPORT*  Clinical Data: Chest pain for 1 week.  History of 2 previous CABG 's, hiatal hernia, pacemaker, hypertension.  CHEST - 2 VIEW  Comparison: 03/09/2012  Findings: The patient has a left-sided transvenous pacemaker, lead to the coronary sinus and right atrium. Previous median sternotomy and CABG.  The heart is mildly enlarged.  The lungs are free of focal consolidations and pleural effusions. Visualized osseous structures have a normal appearance.  Surgical clips are identified in the upper abdomen.  IMPRESSION: No evidence for acute cardiopulmonary abnormality.   Original Report Authenticated By: Norva Pavlov, M.D.      No diagnosis found.    MDM   Date: 07/09/2012  Rate: 70  Rhythm: paced  QRS Axis: normal  Intervals: normal  ST/T Wave abnormalities: nonspecific ST changes  Conduction Disutrbances:paced  Narrative Interpretation:   Old EKG Reviewed: unchanged  6:29 PM Patient given GI counseling Carafate and feels better. Do not suspect patient has ACS. Patient likely has GERD. Will place on Carafate he will see his Dr.        Toy Baker, MD 07/09/12 [phone]

## 2012-07-09 NOTE — Telephone Encounter (Signed)
New Problem:    Patient called in wanting to speak with you about something that's is important and would like to consult with you before going to the hospital.  Please call back.

## 2012-07-09 NOTE — Telephone Encounter (Signed)
Discussed with  Dr. Patty Sermons and patient will go to ED for evaluation. Advised patient

## 2012-07-09 NOTE — Telephone Encounter (Signed)
Has been on Dexilant for 10 days Rx'd by PCP and not feeling any better. Has been back on Omeprazole 80 mg daily. Patient states he is having bad indigestion, chest pain, and choking feeling.   Was told by GI that this was his heart not GI related. States he is just not getting any better. Does not want to have to go to the emergency room but will go if he needs to.  Will forward to  Dr. Patty Sermons for review

## 2012-07-09 NOTE — ED Notes (Signed)
Pt is here with midsternal chest pain off and on for a month with known cardiac history.  Pt reports indigestion feeling

## 2012-07-10 ENCOUNTER — Encounter: Payer: Self-pay | Admitting: *Deleted

## 2012-07-11 ENCOUNTER — Ambulatory Visit: Payer: Medicare Other | Admitting: Cardiology

## 2012-07-12 ENCOUNTER — Ambulatory Visit (INDEPENDENT_AMBULATORY_CARE_PROVIDER_SITE_OTHER): Payer: Medicare Other | Admitting: Cardiology

## 2012-07-12 ENCOUNTER — Encounter: Payer: Self-pay | Admitting: Cardiology

## 2012-07-12 ENCOUNTER — Telehealth: Payer: Self-pay | Admitting: Cardiology

## 2012-07-12 VITALS — BP 102/66 | HR 70 | Ht 66.0 in | Wt 194.4 lb

## 2012-07-12 DIAGNOSIS — I10 Essential (primary) hypertension: Secondary | ICD-10-CM

## 2012-07-12 DIAGNOSIS — I2510 Atherosclerotic heart disease of native coronary artery without angina pectoris: Secondary | ICD-10-CM

## 2012-07-12 DIAGNOSIS — I4891 Unspecified atrial fibrillation: Secondary | ICD-10-CM

## 2012-07-12 NOTE — Assessment & Plan Note (Signed)
Blood pressure was remaining stable on current therapy 

## 2012-07-12 NOTE — Telephone Encounter (Signed)
Advised patient

## 2012-07-12 NOTE — Progress Notes (Signed)
[name] Date of Birth:  1940-09-25 [facility] [address] [place], [place]  [address] [phone]         Fax   [phone]  History of Present Illness: This pleasant 72 year old gentleman is seen for a followup office visit. He was recently admitted to [facility] on 03/09/12 and discharged on 03/11/12 he was admitted after he complained of having some chest discomfort at the time of a routine pacemaker check up. He was found to be in atrial fibrillation at the time of admission. On the date of discharge 11//13 the patient underwent a Lexus scan Myoview stress test which revealed left ventricular ejection fraction 32% with septal and inferior wall motion abnormalities and with an inferior lateral scar but no evidence of reversible ischemia. His prior ejection fraction at the time of catheter in 2012 was approximately 40%. 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 His last cardiac catheterization in October 2012 showed that his grafts were patent. The patient also has a history of hypercholesterolemia and essential hypertension. He has had a prior history of paroxysmal atrial flutter with ablation therapy and subsequent heart block which required a [employer] pacemaker which was implanted in 2012. The patient is on long-term Coumadin. He has a lot of anxiety. On 05/25/12 the patient underwent elective cardioversion which was successful. Normal sinus rhythm and was returned as was confirmed by intracardiac electrogram via his pacemaker. However, within 24 hours after the cardioversion he felt that his pulse was irregular again. EKG  confirmed that he is back in atrial flutter fibrillation. He was essentially asymptomatic and states that the arrhythmia doesn't really bother him much.  Since his last visit here he was seen in the [facility] emergency room earlier this week where he went because of chest pain.  Evaluation in the emergency  room led to the diagnosis of chest pain of GI origin and the patient is scheduled to see his gastroenterologist in [place] later this week.  He was placed on Carafate which has helped his symptoms   Current Outpatient Prescriptions  Medication Sig Dispense Refill  . acetaminophen (TYLENOL) 325 MG tablet Take 650 mg by mouth 2 (two) times daily as needed. pain      . ALPRAZolam (XANAX) 1 MG tablet Take 1 tablet (1 mg total) by mouth 3 (three) times daily as needed. For anxiety  90 tablet  3  . aspirin EC 81 MG EC tablet Take 1 tablet (81 mg total) by mouth daily.      [name] Kitchen atorvastatin (LIPITOR) 40 MG tablet Take 1 tablet (40 mg total) by mouth daily.  45 tablet  6  . carvedilol (COREG) 12.5 MG tablet Take 6.25 mg by mouth 2 (two) times daily with a meal. 1/2 tablet twice a day      . doxepin (SINEQUAN) 25 MG capsule Take 25 mg by mouth 2 (two) times daily. Takes 1 tablet in am and 2 tablets in pm      . gabapentin (NEURONTIN) 100 MG capsule Take 100 mg by mouth 2 (two) times daily.       . hydroxypropyl methylcellulose (ISOPTO TEARS) 2.5 % ophthalmic solution Place 1 drop into both eyes 4 (four) times daily as needed. Dry eyes      . isosorbide mononitrate (IMDUR) 60 MG 24 hr tablet Take 1 tablet (60 mg total) by mouth daily.  90 tablet  3  . lisinopril (  PRINIVIL,ZESTRIL) 5 MG tablet Take 1 tablet (5 mg total) by mouth 2 (two) times daily.  60 tablet  5  . Multiple Vitamin (MULTIVITAMIN) tablet Take 1 tablet by mouth daily.        . nitroGLYCERIN (NITROSTAT) 0.4 MG SL tablet Place 0.4 mg under the tongue every 5 (five) minutes as needed. For chest pain      . omeprazole (PRILOSEC) 20 MG capsule Take 40 mg by mouth 2 (two) times daily.      . Rivaroxaban (XARELTO) 20 MG TABS Take by mouth daily.      . sennosides-docusate sodium (SENOKOT-S) 8.6-50 MG tablet Take 1 tablet by mouth daily.      . sucralfate (CARAFATE) 1 G tablet Take 1 tablet (1 g total) by mouth 4 (four) times daily.  30 tablet   0   No current facility-administered medications for this visit.    Allergies  Allergen Reactions  . Sulfa Drugs Cross Reactors Other (See Comments)    unknown  . Ultram (Tramadol Hcl) Itching    "don't remember how bad"    Patient Active Problem List  Diagnosis  . Ischemic cardiomyopathy  . Hypertension  . Hyperlipidemia  . GERD (gastroesophageal reflux disease)  . Anxiety  . Fibromyalgia  . Hx of CABG  . Cough, persistent  . Paroxysmal atrial flutter  . Chest pain  . Atrial fibrillation  . Complete heart block-intermittent  . Pacemaker-stJudes  . CAD (coronary artery disease)  . Umbilical hernia  . Atypical chest pain    History  Smoking status  . Former Smoker -- 1.00 packs/day for 10 years  . Types: Cigarettes  . Quit date: 09/21/1973  Smokeless tobacco  . Never Used    History  Alcohol Use No    Family History  Problem Relation Age of Onset  . Stroke Mother   . Heart attack Father     Review of Systems: Constitutional: no fever chills diaphoresis or fatigue or change in weight.  Head and neck: no hearing loss, no epistaxis, no photophobia or visual disturbance. Respiratory: No cough, shortness of breath or wheezing. Cardiovascular: No chest pain peripheral edema, palpitations. Gastrointestinal: No abdominal distention, no abdominal pain, no change in bowel habits hematochezia or melena. Genitourinary: No dysuria, no frequency, no urgency, no nocturia. Musculoskeletal:No arthralgias, no back pain, no gait disturbance or myalgias. Neurological: No dizziness, no headaches, no numbness, no seizures, no syncope, no weakness, no tremors. Hematologic: No lymphadenopathy, no easy bruising. Psychiatric: No confusion, no hallucinations, no sleep disturbance.    Physical Exam: Filed Vitals:   07/12/12 1046  BP: 102/66  Pulse: 70   the general appearance reveals a well-developed well-nourished anxious gentleman in no distress.The head and neck exam  reveals pupils equal and reactive.  Extraocular movements are full.  There is no scleral icterus.  The mouth and pharynx are normal.  The neck is supple.  The carotids reveal no bruits.  The jugular venous pressure is normal.  The  thyroid is not enlarged.  There is no lymphadenopathy.  The chest is clear to percussion and auscultation.  There are no rales or rhonchi.  Expansion of the chest is symmetrical.  The precordium is quiet.  The first heart sound is normal.  The second heart sound is physiologically split.  There is no murmur gallop rub or click.  There is no abnormal lift or heave.  The abdomen is soft and nontender.  The bowel sounds are normal.  The liver and  spleen are not enlarged.  There are no abdominal masses.  There are no abdominal bruits.  Extremities reveal good pedal pulses.  There is no phlebitis or edema.  There is no cyanosis or clubbing.  Strength is normal and symmetrical in all extremities.  There is no lateralizing weakness.  There are no sensory deficits.  The skin is warm and dry.  There is no rash.     Assessment / Plan: Switch from warfarin to Xarelto 20 mg daily.  He had normal renal function earlier this week at [facility] emergency room.  He has an appointment coming up to see his gastroenterologist and also a followup appointment to see his urologist Dr. Darvin Neighbours concerning his elevated fluctuating PSA levels.  We will plan to see Mr. Schara back for a followup office visit in 2 months.

## 2012-07-12 NOTE — Telephone Encounter (Signed)
Pt going to start xarelto again and wants to know if he should continue to take baby asa as well?  pls call [phone] or [phone]

## 2012-07-12 NOTE — Telephone Encounter (Signed)
Will forward to  Dr. Brackbill for review 

## 2012-07-12 NOTE — Assessment & Plan Note (Signed)
The patient has not had any definite recurrent angina.  His most recent chest pain evaluation led to the diagnosis of dyspepsia which has improved on Carafate

## 2012-07-12 NOTE — Telephone Encounter (Signed)
Yes  ASA 81 daily

## 2012-07-12 NOTE — Patient Instructions (Signed)
Will have you try Xarelto 20 mg daily and stop your Warfarin.  If you have any problems with the samples, call our office [phone]). Otherwise continue same dose of medications   Your physician recommends that you schedule a follow-up appointment in: 2 months

## 2012-07-12 NOTE — Assessment & Plan Note (Signed)
The patient remains in atrial fibrillation.  He has had no TIA symptoms.  He is on Coumadin and his last INR was 2.42 in the emergency room earlier this week.  The patient would like to try Xarelto again.  In retrospect he is not sure if the Xarelto caused him to have itching or whether it was just a coincidence.  He will hold his Coumadin today and start Xarelto tomorrow evening with supper.

## 2012-07-18 ENCOUNTER — Telehealth: Payer: Self-pay | Admitting: Cardiology

## 2012-07-18 NOTE — Telephone Encounter (Signed)
Will forward to  Dr. Brackbill for review 

## 2012-07-18 NOTE — Telephone Encounter (Signed)
New problem   Pt is having possible side effect from Xarelto; symptoms are skin itching. He want to know what does he need to do. Please call pt.

## 2012-07-18 NOTE — Telephone Encounter (Signed)
Patient states itching is very mild and would like to take for a few more days. Discussed with  Dr. Patty Sermons and that is ok. Patient will call back with update.

## 2012-07-18 NOTE — Telephone Encounter (Signed)
The itching could be from the Xarelto.  Stop Xarelto and start warfarin 5 mg daily or as directed.  Check INR Friday.

## 2012-07-19 ENCOUNTER — Other Ambulatory Visit (INDEPENDENT_AMBULATORY_CARE_PROVIDER_SITE_OTHER): Payer: Medicare Other

## 2012-07-19 ENCOUNTER — Telehealth: Payer: Self-pay | Admitting: Cardiology

## 2012-07-19 DIAGNOSIS — Z125 Encounter for screening for malignant neoplasm of prostate: Secondary | ICD-10-CM

## 2012-07-19 DIAGNOSIS — R972 Elevated prostate specific antigen [PSA]: Secondary | ICD-10-CM

## 2012-07-19 LAB — PSA: PSA: 8.2 ng/mL — ABNORMAL HIGH (ref <=4.00)

## 2012-07-19 NOTE — Progress Notes (Signed)
Pt came for labs only  

## 2012-07-19 NOTE — Telephone Encounter (Signed)
Spoke with Orma Flaming D and she recommended taking warfarin and Xarelto together for 3 days and then continue warfarin and get INR on Tuesday 3/25. Patient will resume warfarin at 5 mg daily which is what he was taking prior to starting Xarelto. Advised patient.    Doreatha Massed at 07/19/2012 1:55 PM   Status: Signed            New problem  Pt is still itching from Xarelto. He would like to talk to you about this

## 2012-07-19 NOTE — Telephone Encounter (Signed)
Documented in 3/19 encounter, this was a follow up phone call from that

## 2012-07-19 NOTE — Telephone Encounter (Signed)
New problem   Pt is still itching from Xarelto. He would like to talk to you about this.

## 2012-07-20 ENCOUNTER — Telehealth: Payer: Self-pay | Admitting: Cardiology

## 2012-07-20 NOTE — Telephone Encounter (Signed)
New Problem    Pt has question about taking warfarin-thinks he may be allergic to the medicine

## 2012-07-20 NOTE — Telephone Encounter (Signed)
Patient states has instructed to  taken Xarelto and warfarin together for three nights. The last night will be this Saturday. Pt states after taken the warfarin last night pt had indigestion, pt  does not know if it was a coincidence or not. Pt also said the itching is better but is not going away all together. So he will take the Xarelto by itself Sunday and will call Monday to see what pt needs to do next.

## 2012-07-23 ENCOUNTER — Telehealth: Payer: Self-pay | Admitting: *Deleted

## 2012-07-23 ENCOUNTER — Other Ambulatory Visit: Payer: Self-pay | Admitting: Physician Assistant

## 2012-07-23 DIAGNOSIS — R972 Elevated prostate specific antigen [PSA]: Secondary | ICD-10-CM

## 2012-07-23 NOTE — Progress Notes (Signed)
No record of urologist and referral was made.

## 2012-07-23 NOTE — Telephone Encounter (Signed)
Spoke with pt about elevated psa results.  He has been to a urologist at [place] so he will see him again in April.  Copy of results mailed to pt to take to urologist.

## 2012-07-23 NOTE — Telephone Encounter (Signed)
Agree with plan 

## 2012-07-23 NOTE — Addendum Note (Signed)
Addended by: Gwenith Daily on: 07/23/2012 09:58 AM   Modules accepted: Orders

## 2012-07-24 ENCOUNTER — Ambulatory Visit (INDEPENDENT_AMBULATORY_CARE_PROVIDER_SITE_OTHER): Payer: Medicare Other | Admitting: Pharmacist Clinician (PhC)/ Clinical Pharmacy Specialist

## 2012-07-24 DIAGNOSIS — I4891 Unspecified atrial fibrillation: Secondary | ICD-10-CM

## 2012-07-24 LAB — POCT INR: INR: 1.3

## 2012-07-25 ENCOUNTER — Telehealth: Payer: Self-pay | Admitting: Cardiology

## 2012-07-25 ENCOUNTER — Other Ambulatory Visit: Payer: Self-pay | Admitting: Physician Assistant

## 2012-07-25 DIAGNOSIS — R972 Elevated prostate specific antigen [PSA]: Secondary | ICD-10-CM

## 2012-07-25 NOTE — Telephone Encounter (Signed)
Patient did have INR checked and will continue Warfarin 5 mg daily. Still having itching so he is unsure of itching coming from Xarelto and may want to start again later.   

## 2012-07-25 NOTE — Telephone Encounter (Signed)
Patient did have INR checked and will continue Warfarin 5 mg daily. Still having itching so he is unsure of itching coming from Xarelto and may want to start again later.

## 2012-07-25 NOTE — Telephone Encounter (Signed)
New problem   Pt was told to call back and speak to Dr Patty Sermons nurse concerning Xarelto after he had his blood work. Please call pt concerning this matter.

## 2012-07-26 ENCOUNTER — Telehealth: Payer: Self-pay | Admitting: Cardiology

## 2012-07-26 NOTE — Telephone Encounter (Signed)
Agree. Check INR Monday.

## 2012-07-26 NOTE — Telephone Encounter (Signed)
Spoke with patient who is wondering if blood is okay; states INR was 1.7 yesterday and that nurse at coumadin clinic told him that he probably still had Xarelto in his system and they want to recheck it Monday 3/31.  Patient states he spoke with [facility] yesterday and that she is aware he is having endoscopy 4/2.  Patient states GI said they will not do any biopsies during this procedure.  Patient states he will wait and see what blood work shows Monday.

## 2012-07-26 NOTE — Telephone Encounter (Signed)
New Problem:    Patient caled in wanting to know if he would be ok to have an endoscopy performed on 07/31/12 at 8:30am due to being on warfarin (COUMADIN) 5 MG tablet.  Please call back.

## 2012-07-27 NOTE — Telephone Encounter (Signed)
Left message to call back  

## 2012-07-30 ENCOUNTER — Ambulatory Visit (INDEPENDENT_AMBULATORY_CARE_PROVIDER_SITE_OTHER): Payer: Medicare Other | Admitting: Physician Assistant

## 2012-07-30 ENCOUNTER — Other Ambulatory Visit: Payer: Medicare Other

## 2012-07-30 VITALS — BP 104/71 | HR 70 | Temp 96.7°F | Ht 66.0 in | Wt 193.8 lb

## 2012-07-30 DIAGNOSIS — I4891 Unspecified atrial fibrillation: Secondary | ICD-10-CM

## 2012-07-30 LAB — POCT INR: INR: 2.6

## 2012-07-30 NOTE — Progress Notes (Signed)
Patient ID: [name], male   DOB: Mar 23, 1941, 72 y.o.   MRN: [id number]  take 5 mg daily Recheck 2 weeks

## 2012-07-30 NOTE — Progress Notes (Signed)
Patient came in for protime 

## 2012-07-30 NOTE — Telephone Encounter (Signed)
Patient phoned today, INR between 2-3 and was advised to continue same dose of medications

## 2012-08-07 ENCOUNTER — Ambulatory Visit (INDEPENDENT_AMBULATORY_CARE_PROVIDER_SITE_OTHER): Payer: Medicare Other | Admitting: Pharmacist Clinician (PhC)/ Clinical Pharmacy Specialist

## 2012-08-07 DIAGNOSIS — I4891 Unspecified atrial fibrillation: Secondary | ICD-10-CM

## 2012-08-07 LAB — POCT INR: INR: 1.5

## 2012-08-14 ENCOUNTER — Ambulatory Visit (INDEPENDENT_AMBULATORY_CARE_PROVIDER_SITE_OTHER): Payer: Medicare Other | Admitting: Pharmacist Clinician (PhC)/ Clinical Pharmacy Specialist

## 2012-08-14 DIAGNOSIS — I4891 Unspecified atrial fibrillation: Secondary | ICD-10-CM

## 2012-08-14 LAB — POCT INR: INR: 3.3

## 2012-08-28 ENCOUNTER — Telehealth: Payer: Self-pay | Admitting: Cardiology

## 2012-08-28 ENCOUNTER — Ambulatory Visit (INDEPENDENT_AMBULATORY_CARE_PROVIDER_SITE_OTHER): Payer: Medicare Other | Admitting: Pharmacist Clinician (PhC)/ Clinical Pharmacy Specialist

## 2012-08-28 DIAGNOSIS — I4891 Unspecified atrial fibrillation: Secondary | ICD-10-CM

## 2012-08-28 LAB — POCT INR: INR: 2.3

## 2012-08-28 NOTE — Telephone Encounter (Signed)
Advised ok to try Mucinex for congestion.

## 2012-08-28 NOTE — Telephone Encounter (Signed)
New problem   Pt wants to know what's ok for him to take for allergies

## 2012-08-29 ENCOUNTER — Emergency Department (HOSPITAL_COMMUNITY): Payer: Medicare Other

## 2012-08-29 ENCOUNTER — Encounter (HOSPITAL_COMMUNITY): Payer: Self-pay | Admitting: General Practice

## 2012-08-29 ENCOUNTER — Observation Stay (HOSPITAL_COMMUNITY)
Admission: EM | Admit: 2012-08-29 | Discharge: 2012-08-30 | Disposition: A | Discharge status: Home / Self Care | Payer: Medicare Other | Attending: Cardiology | Admitting: Cardiology

## 2012-08-29 DIAGNOSIS — M797 Fibromyalgia: Secondary | ICD-10-CM | POA: Diagnosis present

## 2012-08-29 DIAGNOSIS — F419 Anxiety disorder, unspecified: Secondary | ICD-10-CM | POA: Diagnosis present

## 2012-08-29 DIAGNOSIS — I4891 Unspecified atrial fibrillation: Secondary | ICD-10-CM | POA: Insufficient documentation

## 2012-08-29 DIAGNOSIS — I10 Essential (primary) hypertension: Secondary | ICD-10-CM | POA: Insufficient documentation

## 2012-08-29 DIAGNOSIS — I255 Ischemic cardiomyopathy: Secondary | ICD-10-CM

## 2012-08-29 DIAGNOSIS — I2589 Other forms of chronic ischemic heart disease: Secondary | ICD-10-CM | POA: Insufficient documentation

## 2012-08-29 DIAGNOSIS — I509 Heart failure, unspecified: Secondary | ICD-10-CM | POA: Insufficient documentation

## 2012-08-29 DIAGNOSIS — I209 Angina pectoris, unspecified: Secondary | ICD-10-CM | POA: Insufficient documentation

## 2012-08-29 DIAGNOSIS — R0789 Other chest pain: Secondary | ICD-10-CM

## 2012-08-29 DIAGNOSIS — R079 Chest pain, unspecified: Secondary | ICD-10-CM | POA: Insufficient documentation

## 2012-08-29 DIAGNOSIS — E785 Hyperlipidemia, unspecified: Secondary | ICD-10-CM

## 2012-08-29 DIAGNOSIS — Z951 Presence of aortocoronary bypass graft: Secondary | ICD-10-CM | POA: Insufficient documentation

## 2012-08-29 DIAGNOSIS — IMO0001 Reserved for inherently not codable concepts without codable children: Secondary | ICD-10-CM | POA: Insufficient documentation

## 2012-08-29 DIAGNOSIS — Z7901 Long term (current) use of anticoagulants: Secondary | ICD-10-CM | POA: Insufficient documentation

## 2012-08-29 DIAGNOSIS — I2510 Atherosclerotic heart disease of native coronary artery without angina pectoris: Principal | ICD-10-CM | POA: Insufficient documentation

## 2012-08-29 DIAGNOSIS — Z950 Presence of cardiac pacemaker: Secondary | ICD-10-CM | POA: Insufficient documentation

## 2012-08-29 DIAGNOSIS — K219 Gastro-esophageal reflux disease without esophagitis: Secondary | ICD-10-CM | POA: Insufficient documentation

## 2012-08-29 DIAGNOSIS — K449 Diaphragmatic hernia without obstruction or gangrene: Secondary | ICD-10-CM | POA: Insufficient documentation

## 2012-08-29 DIAGNOSIS — I5022 Chronic systolic (congestive) heart failure: Secondary | ICD-10-CM | POA: Insufficient documentation

## 2012-08-29 DIAGNOSIS — F411 Generalized anxiety disorder: Secondary | ICD-10-CM | POA: Insufficient documentation

## 2012-08-29 HISTORY — DX: Panic disorder (episodic paroxysmal anxiety): F41.0

## 2012-08-29 HISTORY — DX: Chronic systolic (congestive) heart failure: I50.22

## 2012-08-29 HISTORY — DX: Unspecified malignant neoplasm of skin, unspecified: C44.90

## 2012-08-29 LAB — CBC WITH DIFFERENTIAL/PLATELET
Basophils Absolute: 0 10*3/uL (ref 0.0–0.1)
Basophils Relative: 0 % (ref 0–1)
Eosinophils Absolute: 0.1 10*3/uL (ref 0.0–0.7)
Hemoglobin: 14.8 g/dL (ref 13.0–17.0)
MCH: 30.3 pg (ref 26.0–34.0)
MCHC: 34.3 g/dL (ref 30.0–36.0)
Monocytes Relative: 7 % (ref 3–12)
Neutrophils Relative %: 57 % (ref 43–77)
RDW: 13.7 % (ref 11.5–15.5)

## 2012-08-29 LAB — BASIC METABOLIC PANEL
CO2: 31 mEq/L (ref 19–32)
Calcium: 9.1 mg/dL (ref 8.4–10.5)
Creatinine, Ser: 1.19 mg/dL (ref 0.50–1.35)
GFR calc non Af Amer: 59 mL/min — ABNORMAL LOW (ref >90)
Glucose, Bld: 75 mg/dL (ref 70–99)

## 2012-08-29 LAB — PROTIME-INR: INR: 2.46 — ABNORMAL HIGH (ref 0.00–1.49)

## 2012-08-29 LAB — POCT I-STAT TROPONIN I: Troponin i, poc: 0 ng/mL (ref 0.00–0.08)

## 2012-08-29 LAB — PRO B NATRIURETIC PEPTIDE: Pro B Natriuretic peptide (BNP): 782.5 pg/mL — ABNORMAL HIGH (ref 0–125)

## 2012-08-29 LAB — TROPONIN I: Troponin I: <0.3 ng/mL (ref <0.30)

## 2012-08-29 MED ORDER — PRAMOXINE-ZINC OXIDE IN MO 1-12.5 % RE OINT
1.0000 "application " | TOPICAL_OINTMENT | Freq: Three times a day (TID) | RECTAL | Status: DC | PRN
  Filled 2012-08-29: qty 28.3

## 2012-08-29 MED ORDER — SENNOSIDES-DOCUSATE SODIUM 8.6-50 MG PO TABS
1.0000 | ORAL_TABLET | Freq: Every day | ORAL | Status: DC
  Administered 2012-08-29 – 2012-08-30 (×2): 1 via ORAL
  Filled 2012-08-29 (×2): qty 1

## 2012-08-29 MED ORDER — ASPIRIN 300 MG RE SUPP: 300.0000 mg | RECTAL | Status: DC

## 2012-08-29 MED ORDER — PANTOPRAZOLE SODIUM 40 MG PO TBEC
40.0000 mg | DELAYED_RELEASE_TABLET | Freq: Every day | ORAL | Status: DC
  Administered 2012-08-30 (×2): 40 mg via ORAL
  Filled 2012-08-29 (×2): qty 1

## 2012-08-29 MED ORDER — WARFARIN SODIUM 5 MG PO TABS
5.0000 mg | ORAL_TABLET | Freq: Every day | ORAL | Status: DC
  Administered 2012-08-30: 5 mg via ORAL
  Filled 2012-08-29 (×3): qty 1

## 2012-08-29 MED ORDER — ASPIRIN EC 81 MG PO TBEC
81.0000 mg | DELAYED_RELEASE_TABLET | Freq: Every day | ORAL | Status: DC
  Filled 2012-08-29 (×2): qty 1

## 2012-08-29 MED ORDER — LOPERAMIDE HCL 2 MG PO CAPS: 2.0000 mg | ORAL_CAPSULE | ORAL | Status: DC | PRN

## 2012-08-29 MED ORDER — DOXEPIN HCL 50 MG PO CAPS
50.0000 mg | ORAL_CAPSULE | Freq: Every day | ORAL | Status: DC
  Administered 2012-08-29: 50 mg via ORAL
  Filled 2012-08-29 (×2): qty 1

## 2012-08-29 MED ORDER — LISINOPRIL 5 MG PO TABS
5.0000 mg | ORAL_TABLET | Freq: Two times a day (BID) | ORAL | Status: DC
  Administered 2012-08-30: 5 mg via ORAL
  Filled 2012-08-29 (×4): qty 1

## 2012-08-29 MED ORDER — ASPIRIN 81 MG PO CHEW: 324.0000 mg | CHEWABLE_TABLET | ORAL | Status: DC

## 2012-08-29 MED ORDER — LORATADINE 10 MG PO TABS
10.0000 mg | ORAL_TABLET | Freq: Every day | ORAL | Status: DC
  Administered 2012-08-30: 10 mg via ORAL
  Filled 2012-08-29 (×3): qty 1

## 2012-08-29 MED ORDER — ATORVASTATIN CALCIUM 40 MG PO TABS: 40.0000 mg | ORAL_TABLET | Freq: Every day | ORAL | Status: DC

## 2012-08-29 MED ORDER — HYPROMELLOSE (GONIOSCOPIC) 2.5 % OP SOLN
1.0000 [drp] | Freq: Four times a day (QID) | OPHTHALMIC | Status: DC | PRN
Start: 2012-08-29 — End: 2012-08-30
  Filled 2012-08-29: qty 15

## 2012-08-29 MED ORDER — SUCRALFATE 1 G PO TABS: 1.0000 g | ORAL_TABLET | Freq: Three times a day (TID) | ORAL | Status: DC

## 2012-08-29 MED ORDER — GABAPENTIN 100 MG PO CAPS
100.0000 mg | ORAL_CAPSULE | Freq: Two times a day (BID) | ORAL | Status: DC
  Administered 2012-08-30: 100 mg via ORAL
  Filled 2012-08-29 (×4): qty 1

## 2012-08-29 MED ORDER — WARFARIN - PHYSICIAN DOSING INPATIENT: Freq: Every day | Status: DC

## 2012-08-29 MED ORDER — ONDANSETRON HCL 4 MG/2ML IJ SOLN: 4.0000 mg | Freq: Four times a day (QID) | INTRAMUSCULAR | Status: DC | PRN

## 2012-08-29 MED ORDER — NITROGLYCERIN 0.4 MG SL SUBL: 0.4000 mg | SUBLINGUAL_TABLET | SUBLINGUAL | Status: DC | PRN

## 2012-08-29 MED ORDER — SUCRALFATE 1 G PO TABS
1.0000 g | ORAL_TABLET | Freq: Four times a day (QID) | ORAL | Status: DC
  Administered 2012-08-29: 1 g via ORAL
  Filled 2012-08-29 (×6): qty 1

## 2012-08-29 MED ORDER — ISOSORBIDE MONONITRATE ER 60 MG PO TB24
60.0000 mg | ORAL_TABLET | Freq: Every day | ORAL | Status: DC
  Administered 2012-08-30: 60 mg via ORAL
  Filled 2012-08-29 (×3): qty 1

## 2012-08-29 MED ORDER — GUAIFENESIN-DM 100-10 MG/5ML PO SYRP: 15.0000 mL | ORAL_SOLUTION | ORAL | Status: DC | PRN

## 2012-08-29 MED ORDER — ALUM & MAG HYDROXIDE-SIMETH 200-200-20 MG/5ML PO SUSP: 30.0000 mL | ORAL | Status: DC | PRN

## 2012-08-29 MED ORDER — ACETAMINOPHEN 325 MG PO TABS: 650.0000 mg | ORAL_TABLET | ORAL | Status: DC | PRN

## 2012-08-29 MED ORDER — ALPRAZOLAM 0.5 MG PO TABS
1.0000 mg | ORAL_TABLET | Freq: Three times a day (TID) | ORAL | Status: DC | PRN
  Administered 2012-08-29: 1 mg via ORAL
  Filled 2012-08-29: qty 2

## 2012-08-29 MED ORDER — MORPHINE SULFATE 4 MG/ML IJ SOLN
4.0000 mg | Freq: Once | INTRAMUSCULAR | Status: AC
  Administered 2012-08-29: 4 mg via INTRAVENOUS
  Filled 2012-08-29: qty 1

## 2012-08-29 MED ORDER — ADULT MULTIVITAMIN W/MINERALS CH
1.0000 | ORAL_TABLET | Freq: Every day | ORAL | Status: DC
  Administered 2012-08-30: 1 via ORAL
  Filled 2012-08-29 (×2): qty 1

## 2012-08-29 MED ORDER — CARVEDILOL 6.25 MG PO TABS
6.2500 mg | ORAL_TABLET | Freq: Two times a day (BID) | ORAL | Status: DC
  Administered 2012-08-30: 6.25 mg via ORAL
  Filled 2012-08-29 (×4): qty 1

## 2012-08-29 MED ORDER — MAGNESIUM HYDROXIDE 400 MG/5ML PO SUSP: 30.0000 mL | Freq: Every day | ORAL | Status: DC | PRN

## 2012-08-29 MED ORDER — ATORVASTATIN CALCIUM 40 MG PO TABS
40.0000 mg | ORAL_TABLET | Freq: Every day | ORAL | Status: DC
  Filled 2012-08-29: qty 1

## 2012-08-29 MED ORDER — ASPIRIN EC 81 MG PO TBEC
81.0000 mg | DELAYED_RELEASE_TABLET | Freq: Every day | ORAL | Status: DC
  Administered 2012-08-30: 81 mg via ORAL
  Filled 2012-08-29 (×2): qty 1

## 2012-08-29 MED ORDER — DOXEPIN HCL 25 MG PO CAPS
25.0000 mg | ORAL_CAPSULE | Freq: Every day | ORAL | Status: DC
  Administered 2012-08-30: 25 mg via ORAL
  Filled 2012-08-29 (×2): qty 1

## 2012-08-29 MED ORDER — ACETAMINOPHEN 325 MG PO TABS: 650.0000 mg | ORAL_TABLET | Freq: Two times a day (BID) | ORAL | Status: DC | PRN

## 2012-08-29 NOTE — ED Provider Notes (Signed)
Medical screening examination/treatment/procedure(s) were conducted as a shared visit with non-physician practitioner(s) and myself.  I personally evaluated the patient during the encounter.  Patient with previous history of coronary artery disease presents with chest pain. Patient does have a history of GI related chest pain, but also has concern for recurrent coronary artery disease. He did have improvement with nitroglycerin. Patient now stable, pain-free. His cardiologist has been contacted and will admit the patient.  Gilda Crease, MD 08/29/12 1949

## 2012-08-29 NOTE — ED Notes (Signed)
Cardiology at bedside.

## 2012-08-29 NOTE — H&P (Signed)
ADMISSION HISTORY AND PHYSICAL   Date: 08/29/2012               Patient Name:  [name] MRN: [id number]  DOB: Sep 23, 1940 Age / Sex: 72 y.o., male        PCP: Rudi Heap Primary Cardiologist: Ronny Flurry, MD         History of Present Illness: Patient is a 72 y.o. male with a PMHx of CAD, CABG, who was admitted to [name] on 08/29/2012 for evaluation of  Chest pain.   This pleasant 72 year old gentleman is seen for a followup office visit. He was admitted to [facility] on 03/09/12 and discharged on 03/11/12 he was admitted after he complained of having some chest discomfort at the time of a routine pacemaker check up. He was found to be in atrial fibrillation at the time of admission. On the date of discharge 11//13 the patient underwent a Lexiscan Myoview stress test which revealed left ventricular ejection fraction 32% with septal and inferior wall motion abnormalities and with an inferior lateral scar but no evidence of reversible ischemia. His prior ejection fraction at the time of catheter in 2012 was approximately 40%. 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 His last cardiac catheterization in October 2012 showed that his grafts were patent. The patient also has a history of hypercholesterolemia and essential hypertension. He has had a prior history of paroxysmal atrial flutter with ablation therapy and subsequent heart block which required a [employer] pacemaker which was implanted in 2012. The patient is on long-term Coumadin. He has a lot of anxiety. On 05/25/12 the patient underwent elective cardioversion which was successful. Normal sinus rhythm and was returned as was confirmed by intracardiac electrogram via his pacemaker. However, within 24 hours after the cardioversion he felt that his pulse was irregular again. EKG confirmed that he is back in atrial flutter fibrillation. He was essentially asymptomatic and states that the arrhythmia doesn't  really bother him much. Since his last visit here he was seen in the [facility] emergency room earlier this week where he went because of chest pain. Evaluation in the emergency room led to the diagnosis of chest pain of GI origin and the patient is scheduled to see his gastroenterologist in [facility] later this week  He presents  To the hospital tonight with more chest pain.  He has hiatal hernia and anxiety and also has known CAD.   It is difficult for him to tell the difference between these types of CP.  He has been walking regularly recently.  He is limited by some foot pain.  He had no CP while walking on the treadmill.  His CP started 3 hours later.   The pain was in the center of his chest, described as a constant tightness, not associated with dyspnea, no dizziness.  No radiation.  He has had 1 negative POC troponin     Medications: Outpatient medications: No current facility-administered medications on file prior to encounter.   Current Outpatient Prescriptions on File Prior to Encounter  Medication Sig Dispense Refill  . acetaminophen (TYLENOL) 325 MG tablet Take 650 mg by mouth 2 (two) times daily as needed. pain      . ALPRAZolam (XANAX) 1 MG tablet Take 1 tablet (1 mg total) by mouth 3 (three) times daily as needed. For anxiety  90 tablet  3  . aspirin EC 81 MG EC tablet Take 1  tablet (81 mg total) by mouth daily.      . carvedilol (COREG) 12.5 MG tablet Take 6.25 mg by mouth 2 (two) times daily with a meal. 1/2 tablet twice a day      . doxepin (SINEQUAN) 25 MG capsule Take 25 mg by mouth daily. Takes 1 tablet in am and 2 tablets in pm      . gabapentin (NEURONTIN) 100 MG capsule Take 100 mg by mouth 2 (two) times daily.       . hydroxypropyl methylcellulose (ISOPTO TEARS) 2.5 % ophthalmic solution Place 1 drop into both eyes 4 (four) times daily as needed. Dry eyes      . isosorbide mononitrate (IMDUR) 60 MG 24 hr tablet Take 1 tablet (60 mg total) by mouth daily.   90 tablet  3  . lisinopril (PRINIVIL,ZESTRIL) 5 MG tablet Take 1 tablet (5 mg total) by mouth 2 (two) times daily.  60 tablet  5  . Multiple Vitamin (MULTIVITAMIN) tablet Take 1 tablet by mouth daily.        . nitroGLYCERIN (NITROSTAT) 0.4 MG SL tablet Place 0.4 mg under the tongue every 5 (five) minutes as needed. For chest pain      . omeprazole (PRILOSEC) 20 MG capsule Take 40 mg by mouth 2 (two) times daily.      . sennosides-docusate sodium (SENOKOT-S) 8.6-50 MG tablet Take 1 tablet by mouth daily.      [name] Kitchen warfarin (COUMADIN) 5 MG tablet Take 5 mg by mouth at bedtime.          Allergies  Allergen Reactions  . Sulfa Drugs Cross Reactors Other (See Comments)    unknown  . Ultram (Tramadol Hcl) Itching    "don't remember how bad"  . Xarelto (Rivaroxaban) Other (See Comments)    Itching      Past Medical History  Diagnosis Date  . Ischemic cardiomyopathy     EF 40% by heart cath 01/2011   . Hypertension   . Hyperlipidemia   . GERD (gastroesophageal reflux disease)   . Anxiety   . CAD (coronary artery disease)     s/p CABGx4 in 1991, with redo surgery in 2007 with  SVG to an OM and sequential SVG to the PDA and PLV, recent cath 01/2011 with patent grafts  . Atrial flutter October 2012    S/P ablation - not successful  . CHB (complete heart block) October 2012    S/P PTVP following ablation  . Kidney cysts     CYST ON BOTH KIDNEYS  . Hiatal hernia   . Depression   . Other "heavy-for-dates" infants elevated PSA level  . A-fib   . Anginal pain   . Elevated PSA, less than 10 ng/ml 10/2011    "went from 5 to 9"  . Pacemaker   . Fibromyalgia     Past Surgical History  Procedure Laterality Date  . Coronary artery bypass graft  1991    CABG X 5  . Coronary artery bypass graft  06/2005    CABG X 3  . Cardiac catheterization  10/30/2009    Grafts patent. Mild to moderate LV dysfunction. EF 45%. Managed medically.   . Cardiac catheterization      multiple  . Tonsillectomy   1960  . Cholecystectomy  2007  . Insert / replace / remove pacemaker  2012  . Inguinal hernia repair  1960's    "left I think"  . Cataract extraction w/ intraocular lens  implant, bilateral  2012  . Cardioversion  05/25/2012    Procedure: CARDIOVERSION;  Surgeon: Cassell Clement, MD;  Location: [facility] ENDOSCOPY;  Service: Cardiovascular;  Laterality: N/A;    Family History  Problem Relation Age of Onset  . Stroke Mother   . Heart attack Father     Social History:  reports that he quit smoking about 38 years ago. His smoking use included Cigarettes. He has a 10 pack-year smoking history. He has never used smokeless tobacco. He reports that he does not drink alcohol or use illicit drugs.   Review of Systems: Constitutional:  denies fever, chills, diaphoresis, appetite change and fatigue.  HEENT: admits to sneezing,  Respiratory: admits to SOB,   Cardiovascular: admits to chest pain,   Denies  leg swelling.  Gastrointestinal: denies nausea, vomiting, abdominal pain, diarrhea, constipation, blood in stool.  Genitourinary: denies dysuria, urgency, frequency, hematuria, flank pain and difficulty urinating.  Musculoskeletal: admits to  myalgias, back pain, joint swelling, arthralgias and gait problem ( fibromyalgia pain)   Skin: denies pallor, rash and wound.  Neurological: denies dizziness, seizures, syncope, weakness, light-headedness, numbness and headaches.   Hematological: denies adenopathy, easy bruising, personal or family bleeding history.  Psychiatric/ Behavioral: denies suicidal ideation, mood changes, confusion, nervousness, sleep disturbance and agitation.    Physical Exam: BP 126/93  Pulse 69  Temp(Src) 98.4 F (36.9 C) (Oral)  Resp 20  Ht 5\' 11"  (1.803 m)  Wt 195 lb (88.451 kg)  BMI 27.21 kg/m2  SpO2 100%  General: Vital signs reviewed and noted. Well-developed, well-nourished, in no acute distress; alert, appropriate and cooperative throughout examination.he is quite  talkative.  Head: Normocephalic, atraumatic, sclera anicteric, mucus membranes are moist  Neck: Supple. Negative for carotid bruits. JVD not elevated.  Lungs:  Clear bilaterally to auscultation without wheezes, rales, or rhonchi. Breathing is unlabored.  Heart: RRR with S1 S2. No murmurs, rubs, or gallops appreciated.  Abdomen:  Soft, non-tender, non-distended with normoactive bowel sounds. No hepatomegaly. No rebound/guarding. No obvious abdominal masses  MSK: Strength and the appear normal for age.  Extremities: No clubbing or cyanosis. No edema.  Distal pedal pulses are 2+ and equal bilaterally.  Neurologic: Alert and oriented X 3. Moves all extremities spontaneously  Psych:  Responds to questions appropriately with a normal affect.    Lab results: Basic Metabolic Panel:  Recent Labs Lab 08/29/12 1756  NA 134*  K 5.3*  CL 98  CO2 31  GLUCOSE 75  BUN 15  CREATININE 1.19  CALCIUM 9.1    Liver Function Tests: No results found for this basename: AST, ALT, ALKPHOS, BILITOT, PROT, ALBUMIN,  in the last 168 hours No results found for this basename: LIPASE, AMYLASE,  in the last 168 hours  CBC:  Recent Labs Lab 08/29/12 1756  WBC 5.5  NEUTROABS 3.2  HGB 14.8  HCT 43.2  MCV 88.3  PLT 204    Cardiac Enzymes: No results found for this basename: CKTOTAL, CKMB, CKMBINDEX, TROPONINI,  in the last 168 hours  BNP: No components found with this basename: POCBNP,   CBG: No results found for this basename: GLUCAP,  in the last 168 hours  Coagulation Studies:  Recent Labs  08/28/12 1256 08/29/12 1756  LABPROT  --  25.5*  INR 2.3 2.46*     Other results: EKG: V paced   Imaging: Dg Chest 2 View  08/29/2012  *RADIOLOGY REPORT*  Clinical Data: 5 hour history of chest pain.  Prior CABG.  CHEST - 2 VIEW  Comparison: Two-view chest x-ray 12/02/08/2014, 03/19/2012, 12/08/2011, 01/22/2011.  Findings: Prior sternotomy for CABG.  Cardiac silhouette upper normal in size to  slightly enlarged but stable.  Left subclavian dual lead transvenous pacemaker unchanged and appears intact. Thoracic aorta atherosclerotic, unchanged.  Hilar and mediastinal contours otherwise unremarkable.  Lungs clear.  Bronchovascular markings normal.  Pulmonary vascularity normal.  No pneumothorax. No pleural effusions.  Visualized bony thorax intact.  IMPRESSION: Stable borderline to mild cardiomegaly.  No acute cardiopulmonary disease.   Original Report Authenticated By: Hulan Saas, M.D.       Assessment & Plan: 1. Chest pain:  Mr. Iwan has a history of CAD, CABG x 2.  He is unclear if this current pain tonight is similar to his angina pain or is more like his hiatal hernia pain or perhaps it is anxiety pain. He readily admits that he has frequent episodes of anxiety with separate pain.  He also has a history of fibromyalgia and has chronic pain almost all the time.  He had a stress Myoview study on 03/09/2012 which revealed an inferior and inferior lateral scar without evidence of ischemia. He was found to have moderate-  severe left ventricular dysfunction with an ejection fraction of 32%.  Had chest pain for several hours even before he called EMS. We will admit him for observation. If his cardiac enzymes are negative then I think we can send him home safely. If his enzymes are positive then we may need to do further workup.   2. Chronic systolic congestive heart failure: he's not having any symptoms of systolic heart failure. We'll continue with his current medications.  3. Atrial fibrillation: The patient is headache fibrillation. Tonight he is ventricularly paced. We'll continue his Coumadin.  His INR  levels have been therapeutic.  DVT PPX - is currently anticoagulated.   Vesta Mixer, Montez Hageman., MD, [facility] 08/29/2012, 7:27 PM

## 2012-08-29 NOTE — ED Provider Notes (Signed)
History     CSN: [id number]  Arrival date & time 08/29/12  1715   First MD Initiated Contact with Patient 08/29/12 1719      Chief Complaint  Patient presents with  . Chest Pain    (Consider location/radiation/quality/duration/timing/severity/associated sxs/prior treatment) HPI Comments: 72 y.o. Male with PMHx of Iscemic cardiomyopathy (EF 40% 01/2011), HTN, HLD, CAD, CABG (1991 x5, 2007 x3) recent cath 01/2011 with patent grafts, a-fib, heart block, pacemaker presents today with sudden onset chest pain since about 2pm this afternoon. Onset occurred at rest. Pt had gone for a walk at 8am with no sequelae. Describes the pain as central, dull, and emphasized that this did not present as his previous cardiac-related chest pain. Pt states he often has chest pain related to anxiety and reflux. He cannot discern if this pain is cardiac in nature.  Pt admits mild shortness of breath earlier that has resolved. Denies radiation of pain, nausea, vomiting, visual disturbances or diaphoresis.  He took 325 ASA at home with 3 SL nitro which brought his pain from 8/10 to 4/10. Pt then called 911 when pain was not resolved. EMS gave 3 more nitro SL which brought his pain to 2/10.   Seen by Dr. Patty Sermons of [facility].   PCP is Dr. Rudi Heap.   Patient is a 72 y.o. male presenting with chest pain.  Chest Pain Associated symptoms: shortness of breath   Associated symptoms: no diaphoresis, no dizziness, no fever, no headache, no nausea, no numbness, no palpitations, not vomiting and no weakness     Past Medical History  Diagnosis Date  . Ischemic cardiomyopathy     EF 40% by heart cath 01/2011   . Hypertension   . Hyperlipidemia   . GERD (gastroesophageal reflux disease)   . Anxiety   . CAD (coronary artery disease)     s/p CABGx4 in 1991, with redo surgery in 2007 with  SVG to an OM and sequential SVG to the PDA and PLV, recent cath 01/2011 with patent grafts  . Atrial flutter  October 2012    S/P ablation - not successful  . CHB (complete heart block) October 2012    S/P PTVP following ablation  . Kidney cysts     CYST ON BOTH KIDNEYS  . Hiatal hernia   . Depression   . Other "heavy-for-dates" infants elevated PSA level  . A-fib   . Anginal pain   . Elevated PSA, less than 10 ng/ml 10/2011    "went from 5 to 9"  . Pacemaker   . Fibromyalgia     Past Surgical History  Procedure Laterality Date  . Coronary artery bypass graft  1991    CABG X 5  . Coronary artery bypass graft  06/2005    CABG X 3  . Cardiac catheterization  10/30/2009    Grafts patent. Mild to moderate LV dysfunction. EF 45%. Managed medically.   . Cardiac catheterization      multiple  . Tonsillectomy  1960  . Cholecystectomy  2007  . Insert / replace / remove pacemaker  2012  . Inguinal hernia repair  1960's    "left I think"  . Cataract extraction w/ intraocular lens  implant, bilateral  2012  . Cardioversion  05/25/2012    Procedure: CARDIOVERSION;  Surgeon: Cassell Clement, MD;  Location: [facility] ENDOSCOPY;  Service: Cardiovascular;  Laterality: N/A;    Family History  Problem Relation Age of Onset  . Stroke Mother   . Heart  attack Father     History  Substance Use Topics  . Smoking status: Former Smoker -- 1.00 packs/day for 10 years    Types: Cigarettes    Quit date: 09/21/1973  . Smokeless tobacco: Never Used  . Alcohol Use: No      Review of Systems  Constitutional: Negative for fever and diaphoresis.  HENT: Negative for neck pain and neck stiffness.   Eyes: Negative for visual disturbance.  Respiratory: Positive for shortness of breath. Negative for apnea and chest tightness.   Cardiovascular: Positive for chest pain. Negative for palpitations.  Gastrointestinal: Negative for nausea, vomiting, diarrhea and constipation.  Genitourinary: Negative for dysuria.  Musculoskeletal: Negative for gait problem.  Skin: Negative for rash.  Neurological: Negative for  dizziness, weakness, light-headedness, numbness and headaches.    Allergies  Sulfa drugs cross reactors; Ultram; and Xarelto  Home Medications   Current Outpatient Rx  Name  Route  Sig  Dispense  Refill  . acetaminophen (TYLENOL) 325 MG tablet   Oral   Take 650 mg by mouth 2 (two) times daily as needed. pain         . ALPRAZolam (XANAX) 1 MG tablet   Oral   Take 1 tablet (1 mg total) by mouth 3 (three) times daily as needed. For anxiety   90 tablet   3   . aspirin EC 81 MG EC tablet   Oral   Take 1 tablet (81 mg total) by mouth daily.         [name] Kitchen atorvastatin (LIPITOR) 40 MG tablet   Oral   Take 40 mg by mouth at bedtime.         . carvedilol (COREG) 12.5 MG tablet   Oral   Take 6.25 mg by mouth 2 (two) times daily with a meal. 1/2 tablet twice a day         . doxepin (SINEQUAN) 25 MG capsule   Oral   Take 25 mg by mouth daily. Takes 1 tablet in am and 2 tablets in pm         . gabapentin (NEURONTIN) 100 MG capsule   Oral   Take 100 mg by mouth 2 (two) times daily.          . hydroxypropyl methylcellulose (ISOPTO TEARS) 2.5 % ophthalmic solution   Both Eyes   Place 1 drop into both eyes 4 (four) times daily as needed. Dry eyes         . isosorbide mononitrate (IMDUR) 60 MG 24 hr tablet   Oral   Take 1 tablet (60 mg total) by mouth daily.   90 tablet   3   . lisinopril (PRINIVIL,ZESTRIL) 5 MG tablet   Oral   Take 1 tablet (5 mg total) by mouth 2 (two) times daily.   60 tablet   5   . loratadine (CLARITIN) 10 MG tablet   Oral   Take 10 mg by mouth daily.         . Multiple Vitamin (MULTIVITAMIN) tablet   Oral   Take 1 tablet by mouth daily.           . nitroGLYCERIN (NITROSTAT) 0.4 MG SL tablet   Sublingual   Place 0.4 mg under the tongue every 5 (five) minutes as needed. For chest pain         . omeprazole (PRILOSEC) 20 MG capsule   Oral   Take 40 mg by mouth 2 (two) times daily.         [name] Kitchen  sennosides-docusate sodium  (SENOKOT-S) 8.6-50 MG tablet   Oral   Take 1 tablet by mouth daily.         . sucralfate (CARAFATE) 1 G tablet   Oral   Take 1 g by mouth 3 (three) times daily.         [name] Kitchen warfarin (COUMADIN) 5 MG tablet   Oral   Take 5 mg by mouth at bedtime.            BP 123/81  Pulse 70  Temp(Src) 97.6 F (36.4 C) (Oral)  Resp 13  Ht 5\' 11"  (1.803 m)  Wt 195 lb (88.451 kg)  BMI 27.21 kg/m2  SpO2 99%  Physical Exam  Nursing note and vitals reviewed. Constitutional: He is oriented to person, place, and time. He appears well-developed and well-nourished. No distress.  HENT:  Head: Normocephalic and atraumatic.  Eyes: Conjunctivae and EOM are normal.  Neck: Normal range of motion. Neck supple.  No meningeal signs  Cardiovascular: Normal rate, regular rhythm, normal heart sounds and intact distal pulses.  Exam reveals no gallop and no friction rub.   No murmur heard. Chest pain described as non-radiating, central, dull, pressure  Pulmonary/Chest: Effort normal and breath sounds normal. No respiratory distress. He has no wheezes. He has no rales. He exhibits no tenderness.  Abdominal: Soft. Bowel sounds are normal. He exhibits no distension. There is no tenderness. There is no rebound and no guarding.  Musculoskeletal: Normal range of motion. He exhibits no edema and no tenderness.  Neurological: He is alert and oriented to person, place, and time. No cranial nerve deficit.  Skin: Skin is warm and dry. He is not diaphoretic. No erythema.  Psychiatric: He has a normal mood and affect.    ED Course  Procedures (including critical care time)  Labs Reviewed  PROTIME-INR - Abnormal; Notable for the following:    Prothrombin Time 25.5 (*)    INR 2.46 (*)    All other components within normal limits  BASIC METABOLIC PANEL - Abnormal; Notable for the following:    Sodium 134 (*)    Potassium 5.3 (*)    GFR calc non Af Amer 59 (*)    GFR calc Af Amer 69 (*)    All other components  within normal limits  PRO B NATRIURETIC PEPTIDE - Abnormal; Notable for the following:    Pro B Natriuretic peptide (BNP) 782.5 (*)    All other components within normal limits  CBC WITH DIFFERENTIAL  POCT I-STAT TROPONIN I   Dg Chest 2 View  08/29/2012  *RADIOLOGY REPORT*  Clinical Data: 5 hour history of chest pain.  Prior CABG.  CHEST - 2 VIEW  Comparison: Two-view chest x-ray 12/02/08/2014, 03/19/2012, 12/08/2011, 01/22/2011.  Findings: Prior sternotomy for CABG.  Cardiac silhouette upper normal in size to slightly enlarged but stable.  Left subclavian dual lead transvenous pacemaker unchanged and appears intact. Thoracic aorta atherosclerotic, unchanged.  Hilar and mediastinal contours otherwise unremarkable.  Lungs clear.  Bronchovascular markings normal.  Pulmonary vascularity normal.  No pneumothorax. No pleural effusions.  Visualized bony thorax intact.  IMPRESSION: Stable borderline to mild cardiomegaly.  No acute cardiopulmonary disease.   Original Report Authenticated By: Hulan Saas, M.D.    Medications  morphine 4 MG/ML injection 4 mg (4 mg Intravenous Given 08/29/12 1915)    Date: 08/29/2012  Rate: 69  Rhythm: pacing with underlying a-fib  QRS Axis: normal  Intervals: normal  ST/T Wave abnormalities: normal  Conduction Disutrbances: none  Narrative Interpretation: abnormal EKG  Old EKG Reviewed: 07/09/12 shows similar   No diagnosis found.    MDM  D/t pt significant cardiac hx and severity of sx, concern for cardiac etiology of chest pain. Cardiology has been consulted and will see patient in the ED. Further evaluation is recommended. Pt has been re-evaluated prior to consult and VSS, NAD, heart RRR, lungs CTA.  EKG shows pt is pacing about 70bpm with underlying a-fib. First round of cardiac enzymes negative. BNP 782. CXR shows no acute changes. This case was discussed with Dr. Blinda Leatherwood who has seen the patient and agrees that further evaluation is necessary.    Cardiology consult appreciated who will admit the pt. For r/o of ACS.   Glade Nurse, PA-C 08/29/12 1947

## 2012-08-29 NOTE — ED Notes (Signed)
Per Report from [facility] EMS pt was at home some SOB.  Pt went out and was walking without any difficulty (0800).  This afternoon around 1400 he was resting and began to have Mid chest pain (pressure in nature).  He then took 325 mg of Asprin  PO and 3 SL NTG 0.4 mg.  The pain did go down from 8/10 to a 4/10 but did not resolve.  He called 911 and they administered 3 SL NTG 0.4 mg and his pain is now a 2/10.  His cardiologist is Dr. Wylene Simmer.

## 2012-08-30 ENCOUNTER — Encounter (HOSPITAL_COMMUNITY): Payer: Self-pay | Admitting: Physician Assistant

## 2012-08-30 DIAGNOSIS — I209 Angina pectoris, unspecified: Secondary | ICD-10-CM | POA: Diagnosis present

## 2012-08-30 DIAGNOSIS — Z7901 Long term (current) use of anticoagulants: Secondary | ICD-10-CM

## 2012-08-30 DIAGNOSIS — R0789 Other chest pain: Secondary | ICD-10-CM

## 2012-08-30 MED ORDER — SUCRALFATE 1 G PO TABS: 1.0000 g | ORAL_TABLET | Freq: Three times a day (TID) | ORAL | Status: DC

## 2012-08-30 NOTE — Progress Notes (Signed)
Subjective:  The patient is feeling well this morning.  He has ruled out for myocardial infarction.  Troponins are negative x2.  EKG shows atrial fibrillation with a paced ventricular response.  Objective:  Vital Signs in the last 24 hours: Temp:  [97 F (36.1 C)-98.4 F (36.9 C)] 98.1 F (36.7 C) (05/01 0500) Pulse Rate:  [68-70] 70 (05/01 0823) Resp:  [13-21] 18 (05/01 0500) BP: (108-146)/(62-93) 108/62 mmHg (05/01 0823) SpO2:  [97 %-100 %] 97 % (05/01 0500) Weight:  [195 lb (88.451 kg)] 195 lb (88.451 kg) (04/30 1729)  Intake/Output from previous day:   Intake/Output from this shift: Total I/O In: 360 [P.O.:360] Out: -   . aspirin  324 mg Oral NOW   Or  . aspirin  300 mg Rectal NOW  . aspirin EC  81 mg Oral Daily  . aspirin EC  81 mg Oral Daily  . atorvastatin  40 mg Oral q1800  . carvedilol  6.25 mg Oral BID WC  . doxepin  25 mg Oral Daily  . doxepin  50 mg Oral QHS  . gabapentin  100 mg Oral BID  . isosorbide mononitrate  60 mg Oral Daily  . lisinopril  5 mg Oral BID  . loratadine  10 mg Oral Daily  . multivitamin with minerals  1 tablet Oral Daily  . pantoprazole  40 mg Oral Daily  . senna-docusate  1 tablet Oral Daily  . sucralfate  1 g Oral QID  . warfarin  5 mg Oral QHS  . Warfarin - Physician Dosing Inpatient   Does not apply q1800      Physical Exam: The patient appears to be in no distress.  Head and neck exam reveals that the pupils are equal and reactive.  The extraocular movements are full.  There is no scleral icterus.  Mouth and pharynx are benign.  No lymphadenopathy.  No carotid bruits.  The jugular venous pressure is normal.  Thyroid is not enlarged or tender.  Chest is clear to percussion and auscultation.  No rales or rhonchi.  Expansion of the chest is symmetrical.  Heart reveals no abnormal lift or heave.  First and second heart sounds are normal.  There is no murmur gallop rub or click.  The abdomen is soft and nontender.  Bowel sounds  are normoactive.  There is no hepatosplenomegaly or mass.  There are no abdominal bruits.  Extremities reveal no phlebitis or edema.  Pedal pulses are good.  There is no cyanosis or clubbing.  Neurologic exam is normal strength and no lateralizing weakness.  No sensory deficits.  Integument reveals no rash  Lab Results:  Recent Labs  08/29/12 1756  WBC 5.5  HGB 14.8  PLT 204    Recent Labs  08/29/12 1756  NA 134*  K 5.3*  CL 98  CO2 31  GLUCOSE 75  BUN 15  CREATININE 1.19    Recent Labs  08/29/12 2154 08/30/12 0304  TROPONINI <0.30 <0.30   Hepatic Function Panel No results found for this basename: PROT, ALBUMIN, AST, ALT, ALKPHOS, BILITOT, BILIDIR, IBILI,  in the last 72 hours No results found for this basename: CHOL,  in the last 72 hours No results found for this basename: PROTIME,  in the last 72 hours  Imaging: Imaging results have been reviewed  Cardiac Studies: Telemetry shows atrial fibrillation with a paced ventricular response. Assessment/Plan:  1. Chest pain: The patient has ruled out for myocardial infarction.  It is unclear whether  his chest discomfort is secondary to his chronic angina or to his fibromyalgia or to his dyspepsia from his hiatal hernia.  Chronic anxiety also plays a role. 2. Chronic systolic congestive heart failure: he's not having any symptoms of systolic heart failure. We'll continue with his current medications.  3. Atrial fibrillation: The patient is chronic atrial fibrillation.  He is ventricularly paced. We'll continue his Coumadin. His INR levels have been therapeutic.  Plan: Home today on current therapy.  His potassium is a little high and he will cut back on his potassium intake including stopping eating his daily banana. Patient already has an appointment to see me 2 weeks and he will keep that appointment.   LOS: 1 day    Cassell Clement 08/30/2012, 9:31 AM

## 2012-08-30 NOTE — Progress Notes (Signed)
DC orders received.  Patient stable with no S/S of distress.  Medication and discharge information reviewed with patient and patient's son.  Patient DC home with son. [name] [name]  

## 2012-08-30 NOTE — Discharge Summary (Signed)
CARDIOLOGY DISCHARGE SUMMARY   Patient ID: [name] MRN: [id number] DOB/AGE: [birth year] 72 y.o.  Admit date: 08/29/2012 Discharge date: 08/30/2012  Primary Discharge Diagnosis:     Angina pectoris Secondary Discharge Diagnosis:    GERD (gastroesophageal reflux disease)   Anxiety   Fibromyalgia   Atrial fibrillation   Chronic systolic CHF class II   Chronic anticoagulation  Hospital Course: [name] is a 72 y.o. male with a history of CAD. He had prolonged chest pain on the day of admission. It was prolonged and he called EMS. He came to the hospital where he was admitted for further evaluation and treatment.  His cardiac enzymes were negative for MI. His chest x-ray and other labs were reviewed by Dr. Elease Hashimoto and Dr Sw[place]. The sodium was minimally low and his potassium was slightly high. He is not on potassium supplements. Dr. Swaziland recommended decreasing oral potassium intake, and following him in the office. His BNP was slightly high but volume status was evaluated and felt to be at baseline. His chest x-ray did not show any acute problem and there were no other significant abnormalities. His atrial fibrillation rate was controlled.  His pain was treated with medications including morphine and Carafate and improved. He was monitored closely on telemetry and he was in atrial fibrillation but his heart rate was controlled with a paced ventricular response. His Coumadin level was therapeutic and was monitored during his stay.  On 08/30/2012, [name] was seen by Dr. Swaziland. Dr. Swaziland felt that [name] had multiple possible causes of chest pain including angina, GERD, fibromyalgia and anxiety. Currently his symptoms are well-controlled. Dr. Swaziland evaluated [name] and considered stable for discharge, to follow up as an outpatient.    Labs:   Lab Results  Component Value Date   WBC 5.5 08/29/2012   HGB 14.8 08/29/2012   HCT 43.2 08/29/2012   MCV 88.3  08/29/2012   PLT 204 08/29/2012     Recent Labs Lab 08/29/12 1756  NA 134*  K 5.3*  CL 98  CO2 31  BUN 15  CREATININE 1.19  CALCIUM 9.1  GLUCOSE 75    Recent Labs  08/29/12 2154 08/30/12 0304 08/30/12 0825  TROPONINI <0.30 <0.30 <0.30   Pro B Natriuretic peptide (BNP)  Date/Time Value Range Status  08/29/2012  5:58 PM 782.5* 0 - 125 pg/mL Final  02/10/2011  3:53 AM 279.8* 0 - 125 pg/mL Final    Recent Labs  08/29/12 1756  INR 2.46*   Radiology: Dg Chest 2 View 08/29/2012  *RADIOLOGY REPORT*  Clinical Data: 5 hour history of chest pain.  Prior CABG.  CHEST - 2 VIEW  Comparison: Two-view chest x-ray 12/02/08/2014, 03/19/2012, 12/08/2011, 01/22/2011.  Findings: Prior sternotomy for CABG.  Cardiac silhouette upper normal in size to slightly enlarged but stable.  Left subclavian dual lead transvenous pacemaker unchanged and appears intact. Thoracic aorta atherosclerotic, unchanged.  Hilar and mediastinal contours otherwise unremarkable.  Lungs clear.  Bronchovascular markings normal.  Pulmonary vascularity normal.  No pneumothorax. No pleural effusions.  Visualized bony thorax intact.  IMPRESSION: Stable borderline to mild cardiomegaly.  No acute cardiopulmonary disease.   Original Report Authenticated By: Hulan Saas, M.D.    EKG:  Atrial fibrillation, V pacing  FOLLOW UP PLANS AND APPOINTMENTS Allergies  Allergen Reactions  . Sulfa Drugs Cross Reactors Other (See Comments)    unknown  . Ultram (Tramadol Hcl) Itching    "don't remember how bad"  . Xarelto (  Rivaroxaban) Other (See Comments)    Itching      Medication List    TAKE these medications       acetaminophen 325 MG tablet  Commonly known as:  TYLENOL  Take 650 mg by mouth 2 (two) times daily as needed. pain     ALPRAZolam 1 MG tablet  Commonly known as:  XANAX  Take 1 tablet (1 mg total) by mouth 3 (three) times daily as needed. For anxiety     aspirin 81 MG EC tablet  Take 1 tablet (81 mg total) by  mouth daily.     atorvastatin 40 MG tablet  Commonly known as:  LIPITOR  Take 40 mg by mouth at bedtime.     carvedilol 12.5 MG tablet  Commonly known as:  COREG  Take 6.25 mg by mouth 2 (two) times daily with a meal. 1/2 tablet twice a day     doxepin 25 MG capsule  Commonly known as:  SINEQUAN  Take 25 mg by mouth daily. Takes 1 tablet in am and 2 tablets in pm     gabapentin 100 MG capsule  Commonly known as:  NEURONTIN  Take 100 mg by mouth 2 (two) times daily.     hydroxypropyl methylcellulose 2.5 % ophthalmic solution  Commonly known as:  ISOPTO TEARS  Place 1 drop into both eyes 4 (four) times daily as needed. Dry eyes     isosorbide mononitrate 60 MG 24 hr tablet  Commonly known as:  IMDUR  Take 1 tablet (60 mg total) by mouth daily.     lisinopril 5 MG tablet  Commonly known as:  PRINIVIL,ZESTRIL  Take 1 tablet (5 mg total) by mouth 2 (two) times daily.     loratadine 10 MG tablet  Commonly known as:  CLARITIN  Take 10 mg by mouth daily.     multivitamin tablet  Take 1 tablet by mouth daily.     nitroGLYCERIN 0.4 MG SL tablet  Commonly known as:  NITROSTAT  Place 0.4 mg under the tongue every 5 (five) minutes as needed. For chest pain     omeprazole 20 MG capsule  Commonly known as:  PRILOSEC  Take 40 mg by mouth 2 (two) times daily.     sennosides-docusate sodium 8.6-50 MG tablet  Commonly known as:  SENOKOT-S  Take 1 tablet by mouth daily.     sucralfate 1 G tablet  Commonly known as:  CARAFATE  Take 1 tablet (1 g total) by mouth 4 (four) times daily -  before meals and at bedtime.     warfarin 5 MG tablet  Commonly known as:  COUMADIN  Take 5 mg by mouth at bedtime.        Discharge Orders   Future Appointments Provider Department Dept Phone   09/13/2012 10:00 AM Cassell Clement, MD [facility] Main Office [place]) [phone]   09/25/2012 8:25 AM [facility] [employer] Main Office [place]) [phone]    09/26/2012 11:10 AM [facility] Pharmacist [facility] [phone]   Future Orders Complete By Expires     (HEART FAILURE PATIENTS) Call MD:  Anytime you have any of the following symptoms: 1) 3 pound weight gain in 24 hours or 5 pounds in 1 week 2) shortness of breath, with or without a dry hacking cough 3) swelling in the hands, feet or stomach 4) if you have to sleep on extra pillows at night in order to breathe.  As directed  Diet - low sodium heart healthy  As directed     Scheduling Instructions:      Limit potassium intake, including bananas.    Increase activity slowly  As directed       Follow-up Information   Follow up with Cassell Clement, MD On 09/13/2012. (at 10:00 am)    Contact information:   [address] [place] [address] [phone]       BRING ALL MEDICATIONS WITH YOU TO FOLLOW UP APPOINTMENTS  Time spent with patient to include physician time: 31 min Signed: Theodore Demark, PA-C 08/30/2012, 11:02 AM Co-Sign MD

## 2012-08-31 ENCOUNTER — Telehealth: Payer: Self-pay | Admitting: Cardiology

## 2012-08-31 NOTE — Telephone Encounter (Signed)
Discussed recent [facility] visit

## 2012-08-31 NOTE — Telephone Encounter (Signed)
New Prob      Pt would like to speak to nurse regarding heart failure clinic and walking. Please call.

## 2012-09-13 ENCOUNTER — Ambulatory Visit (INDEPENDENT_AMBULATORY_CARE_PROVIDER_SITE_OTHER): Payer: Medicare Other | Admitting: Cardiology

## 2012-09-13 VITALS — BP 123/72 | HR 70 | Ht 71.0 in | Wt 193.0 lb

## 2012-09-13 DIAGNOSIS — E7800 Pure hypercholesterolemia, unspecified: Secondary | ICD-10-CM

## 2012-09-13 DIAGNOSIS — I259 Chronic ischemic heart disease, unspecified: Secondary | ICD-10-CM

## 2012-09-13 DIAGNOSIS — I442 Atrioventricular block, complete: Secondary | ICD-10-CM

## 2012-09-13 DIAGNOSIS — I209 Angina pectoris, unspecified: Secondary | ICD-10-CM

## 2012-09-13 DIAGNOSIS — I2589 Other forms of chronic ischemic heart disease: Secondary | ICD-10-CM

## 2012-09-13 DIAGNOSIS — I255 Ischemic cardiomyopathy: Secondary | ICD-10-CM

## 2012-09-13 DIAGNOSIS — I10 Essential (primary) hypertension: Secondary | ICD-10-CM

## 2012-09-13 DIAGNOSIS — E785 Hyperlipidemia, unspecified: Secondary | ICD-10-CM

## 2012-09-13 DIAGNOSIS — I4891 Unspecified atrial fibrillation: Secondary | ICD-10-CM

## 2012-09-13 LAB — BASIC METABOLIC PANEL
Calcium: 8.7 mg/dL (ref 8.4–10.5)
Chloride: 102 mEq/L (ref 96–112)
Creatinine, Ser: 1.2 mg/dL (ref 0.4–1.5)
Sodium: 137 mEq/L (ref 135–145)

## 2012-09-13 LAB — HEPATIC FUNCTION PANEL
AST: 19 U/L (ref 0–37)
Albumin: 3.9 g/dL (ref 3.5–5.2)
Alkaline Phosphatase: 63 U/L (ref 39–117)
Total Protein: 6.6 g/dL (ref 6.0–8.3)

## 2012-09-13 LAB — LIPID PANEL
LDL Cholesterol: 65 mg/dL (ref 0–99)
Total CHOL/HDL Ratio: 3
Triglycerides: 84 mg/dL (ref 0.0–149.0)
VLDL: 16.8 mg/dL (ref 0.0–40.0)

## 2012-09-13 NOTE — Assessment & Plan Note (Signed)
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  He has not been having any dizziness or syncope

## 2012-09-13 NOTE — Progress Notes (Signed)
Quick Note:  Please report to patient. The recent labs are stable. Continue same medication and careful diet. ______ 

## 2012-09-13 NOTE — Assessment & Plan Note (Signed)
The patient has occasional mild chest tightness relieved by rest.  It makes an effort to use his treadmill on a regular basis at a low speed.  He walks a mile in about 23 minutes.

## 2012-09-13 NOTE — Assessment & Plan Note (Signed)
The patient has established chronic atrial fibrillation.  He is back on Coumadin.  He rechallenged himself with Xarelto and once again developed itching.  The patient has not had any TIA symptoms

## 2012-09-13 NOTE — Patient Instructions (Signed)
Will obtain labs today and call you with the results ([email or web address])  Your physician recommends that you continue on your current medications as directed. Please refer to the Current Medication list given to you today.  Your physician recommends that you schedule a follow-up appointment in: 2 month ov/ekg 

## 2012-09-13 NOTE — Progress Notes (Signed)
[name] Date of Birth:  1941/03/13 [facility] [address] [place], [place]  [address] [phone]         Fax   [phone]  History of Present Illness: This pleasant 72 year old gentleman is seen for a followup office visit. He was recently admitted to [facility] on 03/09/12 and discharged on 03/11/12 he was admitted after he complained of having some chest discomfort at the time of a routine pacemaker check up. He was found to be in atrial fibrillation at the time of admission. On the date of discharge 11//13 the patient underwent a Lexus scan Myoview stress test which revealed left ventricular ejection fraction 32% with septal and inferior wall motion abnormalities and with an inferior lateral scar but no evidence of reversible ischemia. His prior ejection fraction at the time of catheter in 2012 was approximately 40%. 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 His last cardiac catheterization in October 2012 showed that his grafts were patent. The patient also has a history of hypercholesterolemia and essential hypertension. He has had a prior history of paroxysmal atrial flutter with ablation therapy and subsequent heart block which required a [employer] pacemaker which was implanted in 2012. The patient is on long-term Coumadin. He has a lot of anxiety. On 05/25/12 the patient underwent elective cardioversion which was successful. Normal sinus rhythm and was returned as was confirmed by intracardiac electrogram via his pacemaker. However, within 24 hours after the cardioversion he felt that his pulse was irregular again. EKG confirmed that he is back in atrial flutter fibrillation. He was essentially asymptomatic and states that the arrhythmia doesn't really bother him much.    Current Outpatient Prescriptions  Medication Sig Dispense Refill  . acetaminophen (TYLENOL) 325 MG tablet Take 650 mg by mouth 2 (two) times daily as needed.  pain      . ALPRAZolam (XANAX) 1 MG tablet Take 1 tablet (1 mg total) by mouth 3 (three) times daily as needed. For anxiety  90 tablet  3  . aspirin EC 81 MG EC tablet Take 1 tablet (81 mg total) by mouth daily.      [name] Kitchen atorvastatin (LIPITOR) 40 MG tablet Take 40 mg by mouth at bedtime.      . carvedilol (COREG) 12.5 MG tablet Take 6.25 mg by mouth 2 (two) times daily with a meal. 1/2 tablet twice a day      . doxepin (SINEQUAN) 25 MG capsule Take 25 mg by mouth daily. Takes 1 tablet in am and 2 tablets in pm      . gabapentin (NEURONTIN) 100 MG capsule Take 100 mg by mouth 2 (two) times daily.       . hydroxypropyl methylcellulose (ISOPTO TEARS) 2.5 % ophthalmic solution Place 1 drop into both eyes 4 (four) times daily as needed. Dry eyes      . isosorbide mononitrate (IMDUR) 60 MG 24 hr tablet Take 1 tablet (60 mg total) by mouth daily.  90 tablet  3  . lisinopril (PRINIVIL,ZESTRIL) 5 MG tablet Take 1 tablet (5 mg total) by mouth 2 (two) times daily.  60 tablet  5  . loratadine (CLARITIN) 10 MG tablet Take 10 mg by mouth daily.      . Multiple Vitamin (MULTIVITAMIN) tablet Take 1 tablet by mouth daily.        . nitroGLYCERIN (NITROSTAT) 0.4 MG SL tablet Place 0.4 mg under the tongue every 5 (five)  minutes as needed. For chest pain      . omeprazole (PRILOSEC) 20 MG capsule Take 40 mg by mouth 2 (two) times daily.      . sennosides-docusate sodium (SENOKOT-S) 8.6-50 MG tablet Take 1 tablet by mouth daily.      . sucralfate (CARAFATE) 1 G tablet Take 1 tablet (1 g total) by mouth 4 (four) times daily -  before meals and at bedtime.  120 tablet  3  . warfarin (COUMADIN) 5 MG tablet Take 5 mg by mouth at bedtime.        No current facility-administered medications for this visit.    Allergies  Allergen Reactions  . Sulfa Drugs Cross Reactors Other (See Comments)    unknown  . Ultram (Tramadol Hcl) Itching    "don't remember how bad"  . Xarelto (Rivaroxaban) Other (See Comments)    Itching       Patient Active Problem List   Diagnosis Date Noted  . Cough, persistent 11/15/2010    Priority: High  . Paroxysmal atrial flutter 11/15/2010    Priority: High  . Hx of CABG 09/23/2010    Priority: High  . Hypertension     Priority: High  . Anxiety     Priority: High  . Hyperlipidemia     Priority: Medium  . Angina pectoris 08/30/2012  . Chronic systolic CHF (congestive heart failure), NYHA class 2   . Chronic anticoagulation   . Atypical chest pain 03/11/2012  . Umbilical hernia 01/05/2012  . CAD (coronary artery disease) 10/20/2011  . Atrial fibrillation 05/31/2011  . 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 Pacemaker-stJudes 05/31/2011  . Chest pain 04/11/2011  . Ischemic cardiomyopathy   . GERD (gastroesophageal reflux disease)   . Fibromyalgia     History  Smoking status  . Former Smoker -- 0.50 packs/day for 10 years  . Types: Cigarettes  . Quit date: 09/21/1973  Smokeless tobacco  . Never Used    History  Alcohol Use No    Family History  Problem Relation Age of Onset  . Stroke Mother   . Heart attack Father     Review of Systems: Constitutional: no fever chills diaphoresis or fatigue or change in weight.  Head and neck: no hearing loss, no epistaxis, no photophobia or visual disturbance. Respiratory: No cough, shortness of breath or wheezing. Cardiovascular: No chest pain peripheral edema, palpitations. Gastrointestinal: No abdominal distention, no abdominal pain, no change in bowel habits hematochezia or melena. Genitourinary: No dysuria, no frequency, no urgency, no nocturia. Musculoskeletal:No arthralgias, no back pain, no gait disturbance or myalgias. Neurological: No dizziness, no headaches, no numbness, no seizures, no syncope, no weakness, no tremors. Hematologic: No lymphadenopathy, no easy bruising. Psychiatric: No confusion, no hallucinations, no sleep disturbance.    Physical Exam: Filed Vitals:   09/13/12 1000  BP:  123/72  Pulse: 70   the general appearance reveals a well-developed well-nourished gentleman in no distress.The head and neck exam reveals pupils equal and reactive.  Extraocular movements are full.  There is no scleral icterus.  The mouth and pharynx are normal.  The neck is supple.  The carotids reveal no bruits.  The jugular venous pressure is normal.  The  thyroid is not enlarged.  There is no lymphadenopathy.  The chest is clear to percussion and auscultation.  There are no rales or rhonchi.  Expansion of the chest is symmetrical.  The precordium is quiet.  The first heart sound is normal.  The second heart  sound is physiologically split.  There is no murmur gallop rub or click.  There is no abnormal lift or heave.  The abdomen is soft and nontender.  The bowel sounds are normal.  The liver and spleen are not enlarged.  There are no abdominal masses.  There are no abdominal bruits.  Extremities reveal good pedal pulses.  There is no phlebitis or edema.  There is no cyanosis or clubbing.  Strength is normal and symmetrical in all extremities.  There is no lateralizing weakness.  There are no sensory deficits.  The skin is warm and dry.  There is no rash.     Assessment / Plan: Continue same medication.  He is not requiring any ongoing diuretic at this point for his borderline CHF. He wonders about taking a trip to the mountains in June and I see no reason for him not to do that. Recheck here in 2 months for office visit and EKG.  Blood work today is pending.

## 2012-09-19 ENCOUNTER — Encounter (INDEPENDENT_AMBULATORY_CARE_PROVIDER_SITE_OTHER): Payer: Medicare Other | Admitting: Ophthalmology

## 2012-09-19 DIAGNOSIS — H43819 Vitreous degeneration, unspecified eye: Secondary | ICD-10-CM

## 2012-09-19 DIAGNOSIS — I10 Essential (primary) hypertension: Secondary | ICD-10-CM

## 2012-09-19 DIAGNOSIS — H33309 Unspecified retinal break, unspecified eye: Secondary | ICD-10-CM

## 2012-09-19 DIAGNOSIS — H35039 Hypertensive retinopathy, unspecified eye: Secondary | ICD-10-CM

## 2012-09-20 ENCOUNTER — Ambulatory Visit (INDEPENDENT_AMBULATORY_CARE_PROVIDER_SITE_OTHER): Payer: Medicare Other | Admitting: Nurse Practitioner

## 2012-09-20 VITALS — BP 118/70 | HR 69 | Temp 97.8°F | Wt 198.0 lb

## 2012-09-20 DIAGNOSIS — B9689 Other specified bacterial agents as the cause of diseases classified elsewhere: Secondary | ICD-10-CM

## 2012-09-20 DIAGNOSIS — J019 Acute sinusitis, unspecified: Secondary | ICD-10-CM

## 2012-09-20 MED ORDER — AZITHROMYCIN 250 MG PO TABS: ORAL_TABLET | ORAL | Status: DC

## 2012-09-20 NOTE — Patient Instructions (Signed)

## 2012-09-20 NOTE — Progress Notes (Signed)
  Subjective:    Patient ID: [name], male    DOB: [birth year], 72 y.o.   MRN: [id number]  HPI- Patient in today C/O headache- Nasal congestion- Mainly behind eyes and behind ears- Can't breathe through nose. Started 3 days ago.    Review of Systems  Constitutional: Negative for fever and chills.  HENT: Positive for congestion, rhinorrhea and sneezing. Negative for ear pain.   Respiratory: Negative for cough, choking, chest tightness and shortness of breath.   Cardiovascular: Negative for chest pain, palpitations and leg swelling.  Neurological: Positive for headaches.       Objective:   Physical Exam  Constitutional: He is oriented to person, place, and time. He appears well-developed and well-nourished.  HENT:  Right Ear: Hearing, tympanic membrane, external ear and ear canal normal.  Left Ear: Hearing, tympanic membrane, external ear and ear canal normal.  Nose: Mucosal edema and rhinorrhea present. Right sinus exhibits maxillary sinus tenderness and frontal sinus tenderness. Left sinus exhibits maxillary sinus tenderness and frontal sinus tenderness.  Mouth/Throat: Posterior oropharyngeal erythema present.  Cardiovascular: Normal rate, normal heart sounds and intact distal pulses.   Regular irregularity  Pulmonary/Chest: Effort normal. He has rales (fine bil bases).  Neurological: He is alert and oriented to person, place, and time.     BP 118/70  Pulse 69  Temp(Src) 97.8 F (36.6 C) (Oral)  Wt 198 lb (89.812 kg)  BMI 27.63 kg/m2      Assessment & Plan:  Acute rhinisinusitis -zpak as directed 1. Take meds as prescribed 2. Use a cool mist humidifier especially during the winter months and when heat has  been humid. 3. Use saline nose sprays frequently 4. Saline irrigations of the nose can be very helpful if done frequently.  * 4X daily for 1 week*  * Use of a nettie pot can be helpful with this. Follow directions with this* 5. Drink plenty of fluids 6. Keep  thermostat turn down low 7.For any cough or congestion  Use plain Mucinex- regular strength or max strength is fine   * Children- consult with Pharmacist for dosing 8. For fever or aces or pains- take tylenol or ibuprofen appropriate for age and weight.  * for fevers greater than 101 orally you may alternate ibuprofen and tylenol every  3 hours.   Mary-Margaret Daphine Deutscher, FNP

## 2012-09-25 ENCOUNTER — Encounter: Payer: Medicare Other | Admitting: *Deleted

## 2012-09-26 ENCOUNTER — Ambulatory Visit (INDEPENDENT_AMBULATORY_CARE_PROVIDER_SITE_OTHER): Payer: Medicare Other | Admitting: Pharmacist

## 2012-09-26 ENCOUNTER — Encounter: Payer: Self-pay | Admitting: *Deleted

## 2012-09-26 DIAGNOSIS — I4891 Unspecified atrial fibrillation: Secondary | ICD-10-CM

## 2012-09-26 LAB — POCT INR: INR: 2.2

## 2012-09-27 ENCOUNTER — Ambulatory Visit (INDEPENDENT_AMBULATORY_CARE_PROVIDER_SITE_OTHER): Payer: Medicare Other | Admitting: Nurse Practitioner

## 2012-09-27 ENCOUNTER — Ambulatory Visit (INDEPENDENT_AMBULATORY_CARE_PROVIDER_SITE_OTHER): Payer: Medicare Other

## 2012-09-27 VITALS — BP 108/70 | HR 68 | Temp 97.0°F | Ht 71.0 in | Wt 198.0 lb

## 2012-09-27 DIAGNOSIS — M25551 Pain in right hip: Secondary | ICD-10-CM

## 2012-09-27 DIAGNOSIS — M25559 Pain in unspecified hip: Secondary | ICD-10-CM

## 2012-09-27 DIAGNOSIS — M545 Low back pain: Secondary | ICD-10-CM

## 2012-09-27 DIAGNOSIS — G8911 Acute pain due to trauma: Secondary | ICD-10-CM

## 2012-09-27 NOTE — Progress Notes (Signed)
  Subjective:    Patient ID: GRAIG HESSLING, male    DOB: [birth year], 72 y.o.   MRN: [id number]  HPI 1. Patient was een last week with sinus infection and was given antibiotic and flonase- Patinet is better but not 100% better. He denies any fever or cough.- Overall better 2. Standing a refrigerator and fell. Did not hit his head and was not knocked out. Said that he landed on his butt. No pain sitting, standing or walking.    Review of Systems  All other systems reviewed and are negative.       Objective:   Physical Exam  Constitutional: He appears well-developed and well-nourished.  Cardiovascular: Normal rate and normal heart sounds.   Pulmonary/Chest: Effort normal and breath sounds normal.  Musculoskeletal:  FROM of lumbar spine with slight pain on rotation.  Neurological: He has normal reflexes.  Skin: Skin is warm.   BP 108/70  Pulse 68  Temp(Src) 97 F (36.1 C) (Oral)  Ht 5\' 11"  (1.803 m)  Wt 198 lb (89.812 kg)  BMI 27.63 kg/m2 Xray-No pelvic or hip fracture- degenerative changes bil hips- Preliminary reading by Paulene Floor, FNP  [facility]        Assessment & Plan:  Low back pain secondary to fall  Moist heat  Rest  No heavy lifting , bending or stooping  Tylenol OTC  Mary-Margaret Daphine Deutscher, FNP

## 2012-09-27 NOTE — Patient Instructions (Signed)
Back Pain, Adult  Low back pain is very common. About 1 in 5 people have back pain. The cause of low back pain is rarely dangerous. The pain often gets better over time. About half of people with a sudden onset of back pain feel better in just 2 weeks. About 8 in 10 people feel better by 6 weeks.   CAUSES  Some common causes of back pain include:  · Strain of the muscles or ligaments supporting the spine.  · Wear and tear (degeneration) of the spinal discs.  · Arthritis.  · Direct injury to the back.  DIAGNOSIS  Most of the time, the direct cause of low back pain is not known. However, back pain can be treated effectively even when the exact cause of the pain is unknown. Answering your caregiver's questions about your overall health and symptoms is one of the most accurate ways to make sure the cause of your pain is not dangerous. If your caregiver needs more information, he or she may order lab work or imaging tests (X-rays or MRIs). However, even if imaging tests show changes in your back, this usually does not require surgery.  HOME CARE INSTRUCTIONS  For many people, back pain returns. Since low back pain is rarely dangerous, it is often a condition that people can learn to manage on their own.   · Remain active. It is stressful on the back to sit or stand in one place. Do not sit, drive, or stand in one place for more than 30 minutes at a time. Take short walks on level surfaces as soon as pain allows. Try to increase the length of time you walk each day.  · Do not stay in bed. Resting more than 1 or 2 days can delay your recovery.  · Do not avoid exercise or work. Your body is made to move. It is not dangerous to be active, even though your back may hurt. Your back will likely heal faster if you return to being active before your pain is gone.  · Pay attention to your body when you  bend and lift. Many people have less discomfort when lifting if they bend their knees, keep the load close to their bodies, and  avoid twisting. Often, the most comfortable positions are those that put less stress on your recovering back.  · Find a comfortable position to sleep. Use a firm mattress and lie on your side with your knees slightly bent. If you lie on your back, put a pillow under your knees.  · Only take over-the-counter or prescription medicines as directed by your caregiver. Over-the-counter medicines to reduce pain and inflammation are often the most helpful. Your caregiver may prescribe muscle relaxant drugs. These medicines help dull your pain so you can more quickly return to your normal activities and healthy exercise.  · Put ice on the injured area.  · Put ice in a plastic bag.  · Place a towel between your skin and the bag.  · Leave the ice on for 15-20 minutes, 3-4 times a day for the first 2 to 3 days. After that, ice and heat may be alternated to reduce pain and spasms.  · Ask your caregiver about trying back exercises and gentle massage. This may be of some benefit.  · Avoid feeling anxious or stressed. Stress increases muscle tension and can worsen back pain. It is important to recognize when you are anxious or stressed and learn ways to manage it. Exercise is a great option.  SEEK MEDICAL CARE IF:  · You have pain that is not relieved with rest or   medicine.  · You have pain that does not improve in 1 week.  · You have new symptoms.  · You are generally not feeling well.  SEEK IMMEDIATE MEDICAL CARE IF:   · You have pain that radiates from your back into your legs.  · You develop new bowel or bladder control problems.  · You have unusual weakness or numbness in your arms or legs.  · You develop nausea or vomiting.  · You develop abdominal pain.  · You feel faint.  Document Released: 04/18/2005 Document Revised: 10/18/2011 Document Reviewed: 09/06/2010  Exit[employer]® Patient Information ©2014 [employer].

## 2012-09-28 ENCOUNTER — Telehealth: Payer: Self-pay | Admitting: Nurse Practitioner

## 2012-09-28 NOTE — Telephone Encounter (Signed)
NO wouldn't be able to walk if broken- Can see part of left hip on xrays done yesterday- looks ok- can come in and have xrayed if wants to

## 2012-09-28 NOTE — Telephone Encounter (Signed)
Having pain in left hip today and wasn't sure if he needs this side xray he had right side done yest?

## 2012-09-28 NOTE — Telephone Encounter (Signed)
Pt aware.

## 2012-10-03 ENCOUNTER — Ambulatory Visit (INDEPENDENT_AMBULATORY_CARE_PROVIDER_SITE_OTHER): Payer: Medicare Other | Admitting: *Deleted

## 2012-10-03 ENCOUNTER — Telehealth: Payer: Self-pay | Admitting: Internal Medicine

## 2012-10-03 ENCOUNTER — Encounter: Payer: Self-pay | Admitting: Internal Medicine

## 2012-10-03 DIAGNOSIS — Z950 Presence of cardiac pacemaker: Secondary | ICD-10-CM

## 2012-10-03 DIAGNOSIS — I442 Atrioventricular block, complete: Secondary | ICD-10-CM

## 2012-10-03 LAB — REMOTE PACEMAKER DEVICE
AL IMPEDENCE PM: 390 Ohm
BAMS-0001: 150 {beats}/min
BAMS-0003: 70 {beats}/min
BATTERY VOLTAGE: 2.95 V
DEVICE MODEL PM: 7282780
RV LEAD AMPLITUDE: 12 mv
RV LEAD THRESHOLD: 0.875 V

## 2012-10-03 NOTE — Telephone Encounter (Signed)
Transmission received, no change since last check, patient aware.

## 2012-10-03 NOTE — Telephone Encounter (Signed)
Pt sent a transmission and going out of town, wants to check on results before leaving

## 2012-10-05 ENCOUNTER — Telehealth: Payer: Self-pay | Admitting: Cardiology

## 2012-10-05 NOTE — Telephone Encounter (Signed)
Agree 

## 2012-10-05 NOTE — Telephone Encounter (Signed)
New problem    Pt wants to know if it's ok to take a medication-pt could not tell me what the medication was but would have it when you call him

## 2012-10-05 NOTE — Telephone Encounter (Signed)
Patient given Rx for Zpak and predisone dosepak for sinus infection, started yesterday. Information only. Will forward to  Dr. Patty Sermons for review

## 2012-10-18 ENCOUNTER — Telehealth: Payer: Self-pay | Admitting: Cardiology

## 2012-10-18 ENCOUNTER — Telehealth: Payer: Self-pay | Admitting: Family Medicine

## 2012-10-18 NOTE — Telephone Encounter (Signed)
Advised pt ok to take plain mucinex.

## 2012-10-18 NOTE — Telephone Encounter (Signed)
New problem  Pt states the doctor put him on a Z-pac and he wants to know if he can take Mucinex, also.  He said that the doctor called him and told him he has a bacteria infection.  He said he feels like it is getting down in his throat and chest. He asked if you call him back.

## 2012-10-18 NOTE — Telephone Encounter (Signed)
Last pneumonia was 06/23/10- pt aware

## 2012-10-19 ENCOUNTER — Ambulatory Visit
Admission: RE | Admit: 2012-10-19 | Discharge: 2012-10-19 | Disposition: A | Discharge status: Home / Self Care | Payer: Medicare Other | Source: Ambulatory Visit | Attending: Otolaryngology | Admitting: Otolaryngology

## 2012-10-19 ENCOUNTER — Other Ambulatory Visit: Payer: Self-pay | Admitting: Otolaryngology

## 2012-10-19 DIAGNOSIS — J019 Acute sinusitis, unspecified: Secondary | ICD-10-CM

## 2012-10-19 DIAGNOSIS — R51 Headache: Secondary | ICD-10-CM

## 2012-10-19 DIAGNOSIS — J329 Chronic sinusitis, unspecified: Secondary | ICD-10-CM

## 2012-10-25 ENCOUNTER — Encounter: Payer: Self-pay | Admitting: *Deleted

## 2012-10-25 ENCOUNTER — Other Ambulatory Visit: Payer: Self-pay | Admitting: *Deleted

## 2012-10-25 DIAGNOSIS — F419 Anxiety disorder, unspecified: Secondary | ICD-10-CM

## 2012-10-26 ENCOUNTER — Telehealth: Payer: Self-pay | Admitting: Diagnostic Neuroimaging

## 2012-10-26 NOTE — Telephone Encounter (Signed)
New problem  Pt states he has been having a headache for 6 weeks. He said he was dx with a sinus infection and he had an xray of his sinus and it did not show a sinus infection.  He wants to know what he can take for his headache that will not affect his heart.  He said he has been taking 2 generic tylenol per day and it is not working.

## 2012-10-26 NOTE — Telephone Encounter (Signed)
Called patient and scheduled him with lynn. For Monday @10 :30

## 2012-10-28 MED ORDER — ALPRAZOLAM 1 MG PO TABS: 1.0000 mg | ORAL_TABLET | Freq: Three times a day (TID) | ORAL | Status: DC | PRN

## 2012-10-29 ENCOUNTER — Telehealth: Payer: Self-pay | Admitting: *Deleted

## 2012-10-29 ENCOUNTER — Encounter: Payer: Self-pay | Admitting: Nurse Practitioner

## 2012-10-29 ENCOUNTER — Ambulatory Visit (INDEPENDENT_AMBULATORY_CARE_PROVIDER_SITE_OTHER): Payer: Medicare Other | Admitting: Nurse Practitioner

## 2012-10-29 VITALS — BP 119/70 | HR 68 | Ht 68.0 in | Wt 195.0 lb

## 2012-10-29 DIAGNOSIS — F411 Generalized anxiety disorder: Secondary | ICD-10-CM

## 2012-10-29 DIAGNOSIS — R51 Headache: Secondary | ICD-10-CM

## 2012-10-29 MED ORDER — GABAPENTIN 100 MG PO CAPS: 100.0000 mg | ORAL_CAPSULE | Freq: Four times a day (QID) | ORAL | Status: DC

## 2012-10-29 NOTE — Patient Instructions (Addendum)
Increase Gabapentin 100mg  up to 4 times a day.  We will check blood work today, sed rate and c-reactive protein.  Follow up in 3 months.

## 2012-10-29 NOTE — Telephone Encounter (Signed)
Patient has been advised by neurologist to increase Gabapentin, will do this and see what happens  Rolland Porter at 10/26/2012 12:13 PM   Status: Signed            New problem  Pt states he has been having a headache for 6 weeks. He said he was dx with a sinus infection and he had an xray of his sinus and it did not show a sinus infection. He wants to know what he can take for his headache that will not affect his heart. He said he has been taking 2 generic tylenol per day and it is not working.

## 2012-10-29 NOTE — Telephone Encounter (Signed)
Made telephone note and called patient back

## 2012-10-29 NOTE — Progress Notes (Signed)
[facility]  PATIENT: [name] DOB: [birth year]   HISTORY FROM: patient, chart REASON FOR VISIT: follow up   HISTORICAL  CHIEF COMPLAINT:  Chief Complaint  Patient presents with  . Follow-up    Headache Rv in room 8    HISTORY OF PRESENT ILLNESS: Mr. [name] is a 72 year old right-handed Caucasian gentleman comes in for followup for headache. Patient describes frontal, occipital, pressure headache sensation, with soreness in his eyebrows, nose and face. No nausea, vomiting, photophobia or phonophobia. No throbbing sensation. No visual abnormalities. He has seen ENT, who found No significant sinus pathology.  Patient denies any lightheadedness, vertigo, dizziness at this time. Taking Tylenol when necessary HA, which he feels is mild. Other factors include recent passing of his wife in April 2013, who was in a nursing home with dementia.  UPDATE 10/29/2012: Patient comes in followup for headaches. States that since last year his headache did resolve for a couple months but then returned in early May and has been constant since that time. States that headache is constant dull pain in a bandlike fashion from the the occipital region the comes around to the frontal region of his head. Denies throbbing pain. States that the skin on his scalp feels sore.  States that the pain presently in office is 6 on a scale from 0-10. He has only been taking Tylenol when necessary for the pain. He states in the last few months he's had instances with chest pain that turned out to be panic attack and that he has moved in with his daughter. He denies lightheadedness, vertigo, dizziness, nausea, vomiting, photophobia or phonophobia. He denies visual abnormalities. He states the ENT had diagnosed him with sinusitis and treated him with 2 rounds of Z-pack plus prednisone taper.  He states an x-ray of his sinuses was done as well and found to be negative.  REVIEW OF SYSTEMS: Full  14 system review of systems performed and notable only for: Constitutional: N/A Cardiovascular: N/A  Ear/Nose/Throat: ringing in ears Skin: N/A  Eyes: N/A  Respiratory: N/A  Gastroitestinal: N/A  Genitourinary: Impotence (men) Hematology/Lymphatic: N/A  Endocrine: Feeling hot, cold.  Musculoskeletal:N/A  Allergy/Immunology: allergies Neurological: Headache  Psychiatric: anxiety, depression.   ALLERGIES: Allergies  Allergen Reactions  . Sulfa Drugs Cross Reactors Other (See Comments)    unknown  . Ultram (Tramadol Hcl) Itching    "don't remember how bad"  . Xarelto (Rivaroxaban) Other (See Comments)    Itching     HOME MEDICATIONS: Outpatient Prescriptions Prior to Visit  Medication Sig Dispense Refill  . acetaminophen (TYLENOL) 325 MG tablet Take 650 mg by mouth 2 (two) times daily as needed. pain      . ALPRAZolam (XANAX) 1 MG tablet Take 1 tablet (1 mg total) by mouth 3 (three) times daily as needed. For anxiety  90 tablet  5  . aspirin EC 81 MG EC tablet Take 1 tablet (81 mg total) by mouth daily.      [name] Kitchen atorvastatin (LIPITOR) 40 MG tablet Take 40 mg by mouth at bedtime.      . carvedilol (COREG) 12.5 MG tablet Take 6.25 mg by mouth 2 (two) times daily with a meal. 1/2 tablet twice a day      . doxepin (SINEQUAN) 25 MG capsule Take 25 mg by mouth daily. Takes 1 tablet in am and 2 tablets in pm      . hydroxypropyl methylcellulose (ISOPTO TEARS) 2.5 % ophthalmic solution Place 1 drop into  both eyes 4 (four) times daily as needed. Dry eyes      . isosorbide mononitrate (IMDUR) 60 MG 24 hr tablet Take 1 tablet (60 mg total) by mouth daily.  90 tablet  3  . lisinopril (PRINIVIL,ZESTRIL) 5 MG tablet Take 1 tablet (5 mg total) by mouth 2 (two) times daily.  60 tablet  5  . loratadine (CLARITIN) 10 MG tablet Take 10 mg by mouth daily.      . Multiple Vitamin (MULTIVITAMIN) tablet Take 1 tablet by mouth daily.        . nitroGLYCERIN (NITROSTAT) 0.4 MG SL tablet Place 0.4 mg under  the tongue every 5 (five) minutes as needed. For chest pain      . omeprazole (PRILOSEC) 20 MG capsule Take 40 mg by mouth 2 (two) times daily.      . sennosides-docusate sodium (SENOKOT-S) 8.6-50 MG tablet Take 1 tablet by mouth daily.      . sucralfate (CARAFATE) 1 G tablet Take 1 tablet (1 g total) by mouth 4 (four) times daily -  before meals and at bedtime.  120 tablet  3  . warfarin (COUMADIN) 5 MG tablet Take 5 mg by mouth at bedtime.       . gabapentin (NEURONTIN) 100 MG capsule Take 100 mg by mouth 2 (two) times daily.        No facility-administered medications prior to visit.    PAST MEDICAL HISTORY: Past Medical History  Diagnosis Date  . Ischemic cardiomyopathy     EF 40% by heart cath 01/2011   . Hypertension   . Hyperlipidemia   . GERD (gastroesophageal reflux disease)   . CAD (coronary artery disease)     s/p CABGx4 in 1991, with redo surgery in 2007 with  SVG to an OM and sequential SVG to the PDA and PLV, recent cath 01/2011 with patent grafts  . Atrial flutter October 2012    S/P ablation - not successful  . CHB (complete heart block) October 2012    S/P PTVP following ablation  . Kidney cysts     CYST ON BOTH KIDNEYS  . Hiatal hernia   . Other "heavy-for-dates" infants elevated PSA level  . A-fib   . Anginal pain   . Elevated PSA, less than 10 ng/ml 10/2011    "went from 5 to 9"  . Pacemaker   . Fibromyalgia   . Skin cancer     'above left eyebrow; tip of my nose" (08/29/2012)  . Shortness of breath     "recently" (08/29/2012)  . Depression     "since 1986-1987" (08/29/2012)  . Anxiety     "since 1478-2956" (08/29/2012)  . Panic attacks     "2 since 03/2012" (08/29/2012)  . Chronic systolic CHF (congestive heart failure), NYHA class 2   . Chronic anticoagulation     Coumadin    PAST SURGICAL HISTORY: Past Surgical History  Procedure Laterality Date  . Coronary artery bypass graft  1991    CABG X 5  . Coronary artery bypass graft  06/2005    CABG X 3   . Cardiac catheterization  10/30/2009    Grafts patent. Mild to moderate LV dysfunction. EF 45%. Managed medically.   . Cardiac catheterization      multiple  . Tonsillectomy  1960  . Cholecystectomy  2007  . Insert / replace / remove pacemaker  01/2011    initial placement  . Inguinal hernia repair  1960's    "left  I think"  . Cataract extraction w/ intraocular lens  implant, bilateral  2012  . Cardioversion  05/25/2012    Procedure: CARDIOVERSION;  Surgeon: Cassell Clement, MD;  Location: [facility] ENDOSCOPY;  Service: Cardiovascular;  Laterality: N/A;  . Humerus surgery Left 1960    "growth on the bone taken off; not cancer" (08/29/2012)  . Excisional hemorrhoidectomy  ~ 1980  . Skin cancer excision      'above left eyebrow; tip of my nose" (08/29/2012)  . Atrial flutter ablation  01/2011    "didn't work" (08/29/2012)    FAMILY HISTORY: Family History  Problem Relation Age of Onset  . Stroke Mother   . Heart attack Father     SOCIAL HISTORY: History   Social History  . Marital Status: Widowed    Spouse Name: N/A    Number of Children: 2  . Years of Education: 12   Occupational History  . Not on file.   Social History Main Topics  . Smoking status: Former Smoker -- 0.50 packs/day for 10 years    Types: Cigarettes    Quit date: 09/21/1973  . Smokeless tobacco: Never Used  . Alcohol Use: No  . Drug Use: No  . Sexually Active: No   Other Topics Concern  . Not on file   Social History Narrative   Wife passed away in 08-14-11. Lives alone.      PHYSICAL EXAM  Filed Vitals:   10/29/12 1045  BP: 119/70  Pulse: 68  Height: 5\' 8"  (1.727 m)  Weight: 195 lb (88.451 kg)   Body mass index is 29.66 kg/(m^2).  Generalized: In no acute distress   Head: Atraumatic,  NO TEMPORAL ARTERY TENDERNESS. TEMPORAL ARTERIES ARE PALPABLE AND SYMMETRICAL.   Neck: Supple, no carotid bruits   Cardiac: Iregular rate and  rhythm, no murmur   Pulmonary: Clear to auscultation  bilaterally   Musculoskeletal: No deformity   Neurological examination   Mentation: Alert oriented to time, place, history taking, language fluent, and causual conversation  Cranial nerve II-XII: Pupils were equal round reactive to light extraocular movements were full, visual field were full on confrontational test. facial sensation and strength were normal. hearing was intact to finger rubbing bilaterally. Uvula tongue midline. head turning and shoulder shrug and were normal and symmetric.Tongue protrusion into cheek strength was normal.MOTOR: normal bulk and tone, full strength in the BUE, BLE, fine finger movements normal, no pronator drift SENSORY: normal and symmetric to light touch, pinprick, temperature, vibration COORDINATION: finger-nose-finger, heel-to-shin bilaterally, there was no truncal ataxia REFLEXES: Brachioradialis 2/2, biceps 2/2, triceps 2/2, patellar 2/2, Achilles 2/2, plantar responses were flexor bilaterally. GAIT/STATION: Rising up from seated position without assistance, normal stance, without trunk ataxia, moderate stride, good arm swing, smooth turning, able to perform tiptoe, and heel walking without difficulty.   DIAGNOSTIC DATA (LABS, IMAGING, TESTING) - I reviewed patient records, labs, notes, testing and imaging myself where available.  Lab Results  Component Value Date   WBC 5.5 08/29/2012   HGB 14.8 08/29/2012   HCT 43.2 08/29/2012   MCV 88.3 08/29/2012   PLT 204 08/29/2012      Component Value Date/Time   NA 137 09/13/2012 1046   K 4.6 09/13/2012 1046   CL 102 09/13/2012 1046   CO2 30 09/13/2012 1046   GLUCOSE 89 09/13/2012 1046   BUN 13 09/13/2012 1046   CREATININE 1.2 09/13/2012 1046   CALCIUM 8.7 09/13/2012 1046   PROT 6.6 09/13/2012 1046   ALBUMIN 3.9 09/13/2012  1046   AST 19 09/13/2012 1046   ALT 14 09/13/2012 1046   ALKPHOS 63 09/13/2012 1046   BILITOT 1.4* 09/13/2012 1046   GFRNONAA 59* 08/29/2012 1756   GFRAA 69* 08/29/2012 1756   Lab Results    Component Value Date   CHOL 118 09/13/2012   HDL 36.70* 09/13/2012   LDLCALC 65 09/13/2012   LDLDIRECT 77.3 12/20/2010   TRIG 84.0 09/13/2012   CHOLHDL 3 09/13/2012   Lab Results  Component Value Date   HGBA1C 5.7* 10/20/2011   No results found for this basename: [id number]   Lab Results  Component Value Date   TSH 3.863 12/09/2011    CT HEAD Wo CONTRAST 01/13/12:  Normal CT head.  PARANASAL SINUSES - COMPLETE 3 + VIEW 10/19/12: Negative  ASSESSMENT AND PLAN  72 y.o. year old male  has a past medical history of Ischemic cardiomyopathy; Hypertension; Hyperlipidemia; GERD (gastroesophageal reflux disease); CAD (coronary artery disease); Atrial flutter (October 2012); CHB (complete heart block) (October 2012); Kidney cysts; Hiatal hernia; Other "heavy-for-dates" infants (elevated PSA level); A-fib; Anginal pain; Elevated PSA, less than 10 ng/ml (10/2011); Pacemaker; Fibromyalgia; Skin cancer; Shortness of breath; Depression; Anxiety; Panic attacks; Chronic systolic CHF (congestive heart failure), NYHA class 2; and Chronic anticoagulation. here with Cephalgia.    PLAN: 1. We will check Sed rate and CRP to evaluate for temporal arteritis. 2. Increase Gabapentin 100mg  to four times a day for headache pain. Continue Tylenol prn for mild pain. 3. Recommended he make an appointment to see his therapist again and start seeing her regularly as this constant headache may be rooted in anxiety.  He spoke of seeing someone he liked before, could not recall her name in office. 4. Follow up in 3 months.   Orders Placed This Encounter  Procedures  . Sedimentation Rate  . C-reactive Protein    Meds ordered this encounter  Medications  . gabapentin (NEURONTIN) 100 MG capsule    Sig: Take 1 capsule (100 mg total) by mouth 4 (four) times daily.    Dispense:  120 capsule    Refill:  5    Order Specific Question:  Supervising Provider    Answer:  Suanne Marker [[id number]]     Kailer Heindel NP-C  10/29/2012, 11:53 AM  [facility] [address] [place], [place] [address] [phone]

## 2012-10-30 ENCOUNTER — Ambulatory Visit (INDEPENDENT_AMBULATORY_CARE_PROVIDER_SITE_OTHER): Payer: Medicare Other | Admitting: Pharmacist Clinician (PhC)/ Clinical Pharmacy Specialist

## 2012-10-30 DIAGNOSIS — I4891 Unspecified atrial fibrillation: Secondary | ICD-10-CM

## 2012-10-30 LAB — SEDIMENTATION RATE: Sed Rate: 2 mm/hr (ref 0–30)

## 2012-10-30 LAB — C-REACTIVE PROTEIN: CRP: 1.3 mg/L (ref 0.0–4.9)

## 2012-10-30 LAB — POCT INR: INR: 2.1

## 2012-11-15 ENCOUNTER — Ambulatory Visit (INDEPENDENT_AMBULATORY_CARE_PROVIDER_SITE_OTHER): Payer: Medicare Other | Admitting: Family Medicine

## 2012-11-15 ENCOUNTER — Encounter: Payer: Self-pay | Admitting: Family Medicine

## 2012-11-15 VITALS — BP 131/84 | HR 97 | Temp 98.6°F | Ht 71.0 in | Wt 196.0 lb

## 2012-11-15 DIAGNOSIS — K219 Gastro-esophageal reflux disease without esophagitis: Secondary | ICD-10-CM

## 2012-11-15 DIAGNOSIS — I4891 Unspecified atrial fibrillation: Secondary | ICD-10-CM

## 2012-11-15 DIAGNOSIS — Z951 Presence of aortocoronary bypass graft: Secondary | ICD-10-CM

## 2012-11-15 DIAGNOSIS — IMO0001 Reserved for inherently not codable concepts without codable children: Secondary | ICD-10-CM

## 2012-11-15 DIAGNOSIS — I2589 Other forms of chronic ischemic heart disease: Secondary | ICD-10-CM

## 2012-11-15 DIAGNOSIS — I2510 Atherosclerotic heart disease of native coronary artery without angina pectoris: Secondary | ICD-10-CM

## 2012-11-15 DIAGNOSIS — M797 Fibromyalgia: Secondary | ICD-10-CM

## 2012-11-15 DIAGNOSIS — Z7901 Long term (current) use of anticoagulants: Secondary | ICD-10-CM

## 2012-11-15 DIAGNOSIS — E785 Hyperlipidemia, unspecified: Secondary | ICD-10-CM

## 2012-11-15 DIAGNOSIS — I255 Ischemic cardiomyopathy: Secondary | ICD-10-CM

## 2012-11-15 DIAGNOSIS — F419 Anxiety disorder, unspecified: Secondary | ICD-10-CM

## 2012-11-15 DIAGNOSIS — F411 Generalized anxiety disorder: Secondary | ICD-10-CM

## 2012-11-15 DIAGNOSIS — I10 Essential (primary) hypertension: Secondary | ICD-10-CM

## 2012-11-15 NOTE — Patient Instructions (Addendum)
      Dr Zikeria Keough's Recommendations  Diet and Exercise discussed with patient.  For nutrition information, I recommend books:  1).Eat to Live by Dr Joel Fuhrman. 2).Prevent and Reverse Heart Disease by Dr Caldwell Esselstyn. 3) Dr Neal Barnard's Book:  Program to Reverse Diabetes  Exercise recommendations are:  If unable to walk, then the patient can exercise in a chair 3 times a day. By flapping arms like a bird gently and raising legs outwards to the front.  If ambulatory, the patient can go for walks for 30 minutes 3 times a week. Then increase the intensity and duration as tolerated.  Goal is to try to attain exercise frequency to 5 times a week.  If applicable: Best to perform resistance exercises (machines or weights) 2 days a week and cardio type exercises 3 days per week.  

## 2012-11-15 NOTE — Progress Notes (Signed)
Patient ID: [name], male   DOB: [birth year], 72 y.o.   MRN: [id number] SUBJECTIVE: CC: Chief Complaint  Patient presents with  . Follow-up    3 month follow up     HPI: Patient is here for follow up of hyperlipidemia: Chronic Headache for several years. ;denies Chest Pain;denies weakness;denies Shortness of Breath and orthopnea;denies Visual changes;denies palpitations;denies cough;denies pedal edema;denies symptoms of TIA or stroke;deniesClaudication symptoms. admits to Compliance with medications; denies Problems with medications.  Past Medical History  Diagnosis Date  . Ischemic cardiomyopathy     EF 40% by heart cath 01/2011   . Hypertension   . Hyperlipidemia   . GERD (gastroesophageal reflux disease)   . CAD (coronary artery disease)     s/p CABGx4 in 1991, with redo surgery in 2007 with  SVG to an OM and sequential SVG to the PDA and PLV, recent cath 01/2011 with patent grafts  . Atrial flutter October 2012    S/P ablation - not successful  . CHB (complete heart block) October 2012    S/P PTVP following ablation  . Kidney cysts     CYST ON BOTH KIDNEYS  . Hiatal hernia   . Other "heavy-for-dates" infants elevated PSA level  . A-fib   . Anginal pain   . Elevated PSA, less than 10 ng/ml 10/2011    "went from 5 to 9"  . Pacemaker   . Fibromyalgia   . Skin cancer     'above left eyebrow; tip of my nose" (08/29/2012)  . Shortness of breath     "recently" (08/29/2012)  . Depression     "since 1986-1987" (08/29/2012)  . Anxiety     "since 6213-0865" (08/29/2012)  . Panic attacks     "2 since 03/2012" (08/29/2012)  . Chronic systolic CHF (congestive heart failure), NYHA class 2   . Chronic anticoagulation     Coumadin   Past Surgical History  Procedure Laterality Date  . Coronary artery bypass graft  1991    CABG X 5  . Coronary artery bypass graft  06/2005    CABG X 3  . Cardiac catheterization  10/30/2009    Grafts patent. Mild to moderate LV dysfunction. EF  45%. Managed medically.   . Cardiac catheterization      multiple  . Tonsillectomy  1960  . Cholecystectomy  2007  . Insert / replace / remove pacemaker  01/2011    initial placement  . Inguinal hernia repair  1960's    "left I think"  . Cataract extraction w/ intraocular lens  implant, bilateral  2012  . Cardioversion  05/25/2012    Procedure: CARDIOVERSION;  Surgeon: Cassell Clement, MD;  Location: [facility] ENDOSCOPY;  Service: Cardiovascular;  Laterality: N/A;  . Humerus surgery Left 1960    "growth on the bone taken off; not cancer" (08/29/2012)  . Excisional hemorrhoidectomy  ~ 1980  . Skin cancer excision      'above left eyebrow; tip of my nose" (08/29/2012)  . Atrial flutter ablation  01/2011    "didn't work" (08/29/2012)   History   Social History  . Marital Status: Widowed    Spouse Name: N/A    Number of Children: 2  . Years of Education: 12   Occupational History  . Not on file.   Social History Main Topics  . Smoking status: Former Smoker -- 0.50 packs/day for 10 years    Types: Cigarettes    Quit date: 09/21/1973  . Smokeless tobacco: Never  Used  . Alcohol Use: No  . Drug Use: No  . Sexually Active: No   Other Topics Concern  . Not on file   Social History Narrative   Wife passed away in 2011/08/14. Lives alone.    Family History  Problem Relation Age of Onset  . Stroke Mother   . Heart attack Father    Current Outpatient Prescriptions on File Prior to Visit  Medication Sig Dispense Refill  . acetaminophen (TYLENOL) 325 MG tablet Take 650 mg by mouth 2 (two) times daily as needed. pain      . ALPRAZolam (XANAX) 1 MG tablet Take 1 tablet (1 mg total) by mouth 3 (three) times daily as needed. For anxiety  90 tablet  5  . aspirin EC 81 MG EC tablet Take 1 tablet (81 mg total) by mouth daily.      [name] Kitchen atorvastatin (LIPITOR) 40 MG tablet Take 40 mg by mouth at bedtime.      . carvedilol (COREG) 12.5 MG tablet Take 6.25 mg by mouth 2 (two) times daily with a meal.  1/2 tablet twice a day      . doxepin (SINEQUAN) 25 MG capsule Take 25 mg by mouth daily. Takes 1 tablet in am and 2 tablets in pm      . gabapentin (NEURONTIN) 100 MG capsule Take 1 capsule (100 mg total) by mouth 4 (four) times daily.  120 capsule  5  . hydroxypropyl methylcellulose (ISOPTO TEARS) 2.5 % ophthalmic solution Place 1 drop into both eyes 4 (four) times daily as needed. Dry eyes      . isosorbide mononitrate (IMDUR) 60 MG 24 hr tablet Take 1 tablet (60 mg total) by mouth daily.  90 tablet  3  . lisinopril (PRINIVIL,ZESTRIL) 5 MG tablet Take 1 tablet (5 mg total) by mouth 2 (two) times daily.  60 tablet  5  . loratadine (CLARITIN) 10 MG tablet Take 10 mg by mouth daily.      . Multiple Vitamin (MULTIVITAMIN) tablet Take 1 tablet by mouth daily.        . nitroGLYCERIN (NITROSTAT) 0.4 MG SL tablet Place 0.4 mg under the tongue every 5 (five) minutes as needed. For chest pain      . omeprazole (PRILOSEC) 20 MG capsule Take 40 mg by mouth 2 (two) times daily.      . sennosides-docusate sodium (SENOKOT-S) 8.6-50 MG tablet Take 1 tablet by mouth daily.      . sucralfate (CARAFATE) 1 G tablet Take 1 tablet (1 g total) by mouth 4 (four) times daily -  before meals and at bedtime.  120 tablet  3  . warfarin (COUMADIN) 5 MG tablet Take 5 mg by mouth at bedtime.        No current facility-administered medications on file prior to visit.   Allergies  Allergen Reactions  . Sulfa Drugs Cross Reactors Other (See Comments)    unknown  . Ultram (Tramadol Hcl) Itching    "don't remember how bad"  . Xarelto (Rivaroxaban) Other (See Comments)    Itching     There is no immunization history on file for this patient. Prior to Admission medications   Medication Sig Start Date End Date Taking? Authorizing Provider  acetaminophen (TYLENOL) 325 MG tablet Take 650 mg by mouth 2 (two) times daily as needed. pain   Yes Historical Provider, MD  ALPRAZolam (XANAX) 1 MG tablet Take 1 tablet (1 mg total)  by mouth 3 (three) times daily as  needed. For anxiety 10/25/12  Yes Cassell Clement, MD  aspirin EC 81 MG EC tablet Take 1 tablet (81 mg total) by mouth daily. 03/11/12  Yes Roger A Arguello, PA-C  atorvastatin (LIPITOR) 40 MG tablet Take 40 mg by mouth at bedtime. 05/30/12  Yes Cassell Clement, MD  carvedilol (COREG) 12.5 MG tablet Take 6.25 mg by mouth 2 (two) times daily with a meal. 1/2 tablet twice a day 06/19/12  Yes Cassell Clement, MD  doxepin (SINEQUAN) 25 MG capsule Take 25 mg by mouth daily. Takes 1 tablet in am and 2 tablets in pm 05/31/12  Yes Cassell Clement, MD  fluticasone [facility]) 50 MCG/ACT nasal spray  11/01/12  Yes Historical Provider, MD  gabapentin (NEURONTIN) 100 MG capsule Take 1 capsule (100 mg total) by mouth 4 (four) times daily. 10/29/12  Yes Ronal Fear, NP  hydroxypropyl methylcellulose (ISOPTO TEARS) 2.5 % ophthalmic solution Place 1 drop into both eyes 4 (four) times daily as needed. Dry eyes   Yes Historical Provider, MD  isosorbide mononitrate (IMDUR) 60 MG 24 hr tablet Take 1 tablet (60 mg total) by mouth daily. 12/27/11  Yes Cassell Clement, MD  lisinopril (PRINIVIL,ZESTRIL) 5 MG tablet Take 1 tablet (5 mg total) by mouth 2 (two) times daily. 06/19/12  Yes Cassell Clement, MD  loratadine (CLARITIN) 10 MG tablet Take 10 mg by mouth daily.   Yes Historical Provider, MD  Multiple Vitamin (MULTIVITAMIN) tablet Take 1 tablet by mouth daily.     Yes Historical Provider, MD  nitroGLYCERIN (NITROSTAT) 0.4 MG SL tablet Place 0.4 mg under the tongue every 5 (five) minutes as needed. For chest pain 04/29/11  Yes Cassell Clement, MD  omeprazole (PRILOSEC) 20 MG capsule Take 40 mg by mouth 2 (two) times daily. 02/27/12  Yes Cassell Clement, MD  sennosides-docusate sodium (SENOKOT-S) 8.6-50 MG tablet Take 1 tablet by mouth daily.   Yes Historical Provider, MD  sucralfate (CARAFATE) 1 G tablet Take 1 tablet (1 g total) by mouth 4 (four) times daily -  before meals and at bedtime.  08/30/12  Yes Rhonda G Barrett, PA-C  warfarin (COUMADIN) 5 MG tablet Take 5 mg by mouth at bedtime.    Yes Historical Provider, MD     ROS: As above in the HPI. All other systems are stable or negative.  OBJECTIVE: APPEARANCE:  Patient in no acute distress.The patient appeared well nourished and normally developed. Acyanotic. Waist: VITAL SIGNS:BP 131/84  Pulse 97  Temp(Src) 98.6 F (37 C) (Oral)  Ht 5\' 11"  (1.803 m)  Wt 196 lb (88.905 kg)  BMI 27.35 kg/m2   SKIN: warm and  Dry without overt rashes, tattoos and scars  HEAD and Neck: without JVD, Head and scalp: normal Eyes:No scleral icterus. Fundi normal, eye movements normal. Ears: Auricle normal, canal normal, Tympanic membranes normal, insufflation normal. Nose: normal Throat: normal Neck & thyroid: normal  CHEST & LUNGS: Chest wall: normal Lungs: Clear  CVS: Reveals the PMI to be normally located. Regular rhythm, First and Second Heart sounds are normal,  absence of murmurs, rubs or gallops. Peripheral vasculature: Radial pulses: normal Dorsal pedis pulses: normal Posterior pulses: normal  ABDOMEN:  Appearance: normal Benign, no organomegaly, no masses, no Abdominal Aortic enlargement. No Guarding , no rebound. No Bruits. Bowel sounds: normal  RECTAL: N/A GU: N/A  EXTREMETIES: nonedematous. Both Femoral and Pedal pulses are normal.  MUSCULOSKELETAL:  Spine: normal Joints: intact  NEUROLOGIC: oriented to time,place and person; nonfocal. Strength is normal Sensory is  normal Reflexes are normal Cranial Nerves are normal.  ASSESSMENT: Ischemic cardiomyopathy  Hypertension  Hyperlipidemia  Hx of CABG  GERD (gastroesophageal reflux disease)  Fibromyalgia  Chronic anticoagulation  CAD (coronary artery disease)  Atrial fibrillation  Anxiety  PLAN:  Meds ordered this encounter  Medications  . fluticasone (FLONASE) 50 MCG/ACT nasal spray    Sig:    Results for orders placed in  visit on 10/30/12  POCT INR      Result Value Range   INR 2.1     Reviewed his records. He used to see Helene Kelp previously. Sees Pharmacist for anticoagulation management.  Came to discuss about his chronic headaches. Has seen one provider who treated him with antibiotics for sinusitis. He does have some nasal congestion/allergies. He has been referred to the neurologist and has a diagnosis of tension headaches. He has been referred to psychologist because other providers think that he has  Stress headaches from the loss of his wife several years ago. However, I discussed with him that I realy think the time sequence correspond to when he started on Imdur and it is the nitroglycerine product that is causing his headache. He believes that this is more plausible.  No other  interventions needed at this time.       Dr Woodroe Mode Recommendations  Diet and Exercise discussed with patient.  For nutrition information, I recommend books:  1).Eat to Live by Dr Monico Hoar. 2).Prevent and Reverse Heart Disease by Dr Suzzette Righter. 3) Dr Katherina Right Book:  Program to Reverse Diabetes  Exercise recommendations are:  If unable to walk, then the patient can exercise in a chair 3 times a day. By flapping arms like a bird gently and raising legs outwards to the front.  If ambulatory, the patient can go for walks for 30 minutes 3 times a week. Then increase the intensity and duration as tolerated.  Goal is to try to attain exercise frequency to 5 times a week.  If applicable: Best to perform resistance exercises (machines or weights) 2 days a week and cardio type exercises 3 days per week.  Return in about 3 months (around 02/15/2013) for Recheck medical problems.  Tirrell Buchberger P. Modesto Charon, M.D.

## 2012-11-22 ENCOUNTER — Encounter: Payer: Self-pay | Admitting: Cardiology

## 2012-11-22 ENCOUNTER — Ambulatory Visit (INDEPENDENT_AMBULATORY_CARE_PROVIDER_SITE_OTHER): Payer: Medicare Other | Admitting: Cardiology

## 2012-11-22 VITALS — BP 122/76 | HR 70 | Ht 71.0 in | Wt 195.0 lb

## 2012-11-22 DIAGNOSIS — I4891 Unspecified atrial fibrillation: Secondary | ICD-10-CM

## 2012-11-22 DIAGNOSIS — F419 Anxiety disorder, unspecified: Secondary | ICD-10-CM

## 2012-11-22 DIAGNOSIS — I2589 Other forms of chronic ischemic heart disease: Secondary | ICD-10-CM

## 2012-11-22 DIAGNOSIS — I255 Ischemic cardiomyopathy: Secondary | ICD-10-CM

## 2012-11-22 DIAGNOSIS — F411 Generalized anxiety disorder: Secondary | ICD-10-CM

## 2012-11-22 DIAGNOSIS — I10 Essential (primary) hypertension: Secondary | ICD-10-CM

## 2012-11-22 MED ORDER — LISINOPRIL 5 MG PO TABS: 5.0000 mg | ORAL_TABLET | Freq: Two times a day (BID) | ORAL | Status: DC

## 2012-11-22 MED ORDER — ALPRAZOLAM 1 MG PO TABS: 1.0000 mg | ORAL_TABLET | Freq: Three times a day (TID) | ORAL | Status: DC | PRN

## 2012-11-22 NOTE — Assessment & Plan Note (Signed)
The patient is not having any symptoms of congestive heart failure.  Energy level is fair.  No orthopnea or paroxysmal nocturnal dyspnea.

## 2012-11-22 NOTE — Progress Notes (Signed)
[name] Date of Birth:  11-14-1940 [facility] [address] [name], [place]  [address] [phone]         Fax   [phone]  History of Present Illness: This pleasant 72 year old gentleman is seen for a followup office visit. He was recently admitted to [facility] on 03/09/12 and discharged on 03/11/12 he was admitted after he complained of having some chest discomfort at the time of a routine pacemaker check up. He was found to be in atrial fibrillation at the time of admission. On the date of discharge 11//13 the patient underwent a Lexus scan Myoview stress test which revealed left ventricular ejection fraction 32% with septal and inferior wall motion abnormalities and with an inferior lateral scar but no evidence of reversible ischemia. His prior ejection fraction at the time of catheter in 2012 was approximately 40%. 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 His last cardiac catheterization in October 2012 showed that his grafts were patent. The patient also has a history of hypercholesterolemia and essential hypertension. He has had a prior history of paroxysmal atrial flutter with ablation therapy and subsequent heart block which required a [employer] pacemaker which was implanted in 2012. The patient is on long-term Coumadin. He has a lot of anxiety. On 05/25/12 the patient underwent elective cardioversion which was successful. Normal sinus rhythm and was returned as was confirmed by intracardiac electrogram via his pacemaker. However, within 24 hours after the cardioversion he felt that his pulse was irregular again. EKG confirmed that he is back in atrial flutter fibrillation. He was essentially asymptomatic and states that the arrhythmia doesn't really bother him much.  Strategy is rate control and long-term anticoagulation.  He is intolerant of Xarelto because of itching and is back on long-term Coumadin.   Current Outpatient  Prescriptions  Medication Sig Dispense Refill  . acetaminophen (TYLENOL) 325 MG tablet Take 650 mg by mouth 2 (two) times daily as needed. pain      . ALPRAZolam (XANAX) 1 MG tablet Take 1 tablet (1 mg total) by mouth 3 (three) times daily as needed. For anxiety  90 tablet  5  . aspirin EC 81 MG EC tablet Take 1 tablet (81 mg total) by mouth daily.      [name] Kitchen atorvastatin (LIPITOR) 40 MG tablet Take 40 mg by mouth at bedtime.      . carvedilol (COREG) 12.5 MG tablet Take 6.25 mg by mouth 2 (two) times daily with a meal. 1/2 tablet twice a day      . doxepin (SINEQUAN) 25 MG capsule Take 25 mg by mouth daily. Takes 1 tablet in am and 2 tablets in pm      . fluticasone (FLONASE) 50 MCG/ACT nasal spray       . gabapentin (NEURONTIN) 100 MG capsule Take 1 capsule (100 mg total) by mouth 4 (four) times daily.  120 capsule  5  . hydroxypropyl methylcellulose (ISOPTO TEARS) 2.5 % ophthalmic solution Place 1 drop into both eyes 4 (four) times daily as needed. Dry eyes      . isosorbide mononitrate (IMDUR) 60 MG 24 hr tablet Take 1 tablet (60 mg total) by mouth daily.  90 tablet  3  . lisinopril (PRINIVIL,ZESTRIL) 5 MG tablet Take 1 tablet (5 mg total) by mouth 2 (two) times daily.  60 tablet  5  . loratadine (CLARITIN) 10 MG tablet Take 10 mg by mouth daily.      [name] Kitchen  Multiple Vitamin (MULTIVITAMIN) tablet Take 1 tablet by mouth daily.        . nitroGLYCERIN (NITROSTAT) 0.4 MG SL tablet Place 0.4 mg under the tongue every 5 (five) minutes as needed. For chest pain      . omeprazole (PRILOSEC) 20 MG capsule Take 40 mg by mouth 2 (two) times daily.      . sennosides-docusate sodium (SENOKOT-S) 8.6-50 MG tablet Take 1 tablet by mouth daily.      . sucralfate (CARAFATE) 1 G tablet Take 1 tablet (1 g total) by mouth 4 (four) times daily -  before meals and at bedtime.  120 tablet  3  . warfarin (COUMADIN) 5 MG tablet Take 5 mg by mouth at bedtime.        No current facility-administered medications for this visit.      Allergies  Allergen Reactions  . Sulfa Drugs Cross Reactors Other (See Comments)    unknown  . Ultram (Tramadol Hcl) Itching    "don't remember how bad"  . Xarelto (Rivaroxaban) Other (See Comments)    Itching     Patient Active Problem List   Diagnosis Date Noted  . Cough, persistent 11/15/2010    Priority: High  . Paroxysmal atrial flutter 11/15/2010    Priority: High  . Hx of CABG 09/23/2010    Priority: High  . Hypertension     Priority: High  . Anxiety     Priority: High  . Hyperlipidemia     Priority: Medium  . Angina pectoris 08/30/2012  . Chronic systolic CHF (congestive heart failure), NYHA class 2   . Chronic anticoagulation   . Atypical chest pain 03/11/2012  . Umbilical hernia 01/05/2012  . CAD (coronary artery disease) 10/20/2011  . Atrial fibrillation 05/31/2011  . 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 Pacemaker-[employer]Judes 05/31/2011  . Chest pain 04/11/2011  . Ischemic cardiomyopathy   . GERD (gastroesophageal reflux disease)   . Fibromyalgia     History  Smoking status  . Former Smoker -- 0.50 packs/day for 10 years  . Types: Cigarettes  . Quit date: 09/21/1973  Smokeless tobacco  . Never Used    History  Alcohol Use No    Family History  Problem Relation Age of Onset  . Stroke Mother   . Heart attack Father     Review of Systems: Constitutional: no fever chills diaphoresis or fatigue or change in weight.  Head and neck: no hearing loss, no epistaxis, no photophobia or visual disturbance. Respiratory: No cough, shortness of breath or wheezing. Cardiovascular: No chest pain peripheral edema, palpitations. Gastrointestinal: No abdominal distention, no abdominal pain, no change in bowel habits hematochezia or melena. Genitourinary: No dysuria, no frequency, no urgency, no nocturia. Musculoskeletal:No arthralgias, no back pain, no gait disturbance or myalgias. Neurological: No dizziness, no headaches, no numbness, no seizures,  no syncope, no weakness, no tremors. Hematologic: No lymphadenopathy, no easy bruising. Psychiatric: No confusion, no hallucinations, no sleep disturbance.    Physical Exam: Filed Vitals:   11/22/12 1049  BP: 122/76  Pulse: 70   the general appearance reveals a well-developed well-nourished talkative gentleman in no distress.The head and neck exam reveals pupils equal and reactive.  Extraocular movements are full.  There is no scleral icterus.  The mouth and pharynx are normal.  The neck is supple.  The carotids reveal no bruits.  The jugular venous pressure is normal.  The  thyroid is not enlarged.  There is no lymphadenopathy.  The chest is clear  to percussion and auscultation.  There are no rales or rhonchi.  Expansion of the chest is symmetrical.  The precordium is quiet.  The first heart sound is normal.  The second heart sound is physiologically split.  There is no murmur gallop rub or click.  There is no abnormal lift or heave.  The abdomen is soft and nontender.  The bowel sounds are normal.  The liver and spleen are not enlarged.  There are no abdominal masses.  There are no abdominal bruits.  Extremities reveal good pedal pulses.  There is no phlebitis or edema.  There is no cyanosis or clubbing.  Strength is normal and symmetrical in all extremities.  There is no lateralizing weakness.  There are no sensory deficits.  The skin is warm and dry.  There is no rash.  EKG shows atrial fibrillation with paced ventricular rhythm.   Assessment / Plan:  Continue on same medication.  We refilled his Xanax and his lisinopril for him today.  He is not to appear in court. He wondered whether he would be okay to take a vacation trip to the mountains and this will be okay. Recheck here in 2 months for followup office visit

## 2012-11-22 NOTE — Patient Instructions (Addendum)
YOU SHOULD NOT APPEAR IN COURT  Your physician recommends that you continue on your current medications as directed. Please refer to the Current Medication list given to you today.  Your physician recommends that you schedule a follow-up appointment in: 2 months  Cardiac Diet This diet can help prevent heart disease and stroke. Many factors influence your heart health, including eating and exercise habits. Coronary risk rises a lot with abnormal blood fat (lipid) levels. Cardiac meal planning includes limiting unhealthy fats, increasing healthy fats, and making other small dietary changes. General guidelines are as follows:  Adjust calorie intake to reach and maintain desirable body weight.  Limit total fat intake to less than 30% of total calories. Saturated fat should be less than 7% of calories.  Saturated fats are found in animal products and in some vegetable products. Saturated vegetable fats are found in coconut oil, cocoa butter, palm oil, and palm kernel oil. Read labels carefully to avoid these products as much as possible. Use butter in moderation. Choose tub margarines and oils that have 2 grams of fat or less. Good cooking oils are canola and olive oils.  Practice low-fat cooking techniques. Do not fry food. Instead, broil, bake, boil, steam, grill, roast on a rack, stir-fry, or microwave it. Other fat reducing suggestions include:  Remove the skin from poultry.  Remove all visible fat from meats.  Skim the fat off stews, soups, and gravies before serving them.  Steam vegetables in water or broth instead of sauting them in fat.  Avoid foods with trans fat (or hydrogenated oils), such as commercially fried foods and commercially baked goods. Commercial shortening and deep-frying fats will contain trans fat.  Increase intake of fruits, vegetables, whole grains, and legumes to replace foods high in fat.  Increase consumption of nuts, legumes, and seeds to at least 4 servings  weekly. One serving of a legume equals  cup, and 1 serving of nuts or seeds equals  cup.  Choose whole grains more often. Have 3 servings per day (a serving is 1 ounce [oz]).  Eat 4 to 5 servings of vegetables per day. A serving of vegetables is 1 cup of raw leafy vegetables;  cup of raw or cooked cut-up vegetables;  cup of vegetable juice.  Eat 4 to 5 servings of fruit per day. A serving of fruit is 1 medium whole fruit;  cup of dried fruit;  cup of fresh, frozen, or canned fruit;  cup of 100% fruit juice.  Increase your intake of dietary fiber to 20 to 30 grams per day. Insoluble fiber may help lower your risk of heart disease and may help curb your appetite.  Soluble fiber binds cholesterol to be removed from the blood. Foods high in soluble fiber are dried beans, citrus fruits, oats, apples, bananas, broccoli, Brussels sprouts, and eggplant.  Try to include foods fortified with plant sterols or stanols, such as yogurt, breads, juices, or margarines. Choose several fortified foods to achieve a daily intake of 2 to 3 grams of plant sterols or stanols.  Foods with omega-3 fats can help reduce your risk of heart disease. Aim to have a 3.5 oz portion of fatty fish twice per week, such as salmon, mackerel, albacore tuna, sardines, lake trout, or herring. If you wish to take a fish oil supplement, choose one that contains 1 gram of both DHA and EPA.  Limit processed meats to 2 servings (3 oz portion) weekly.  Limit the sodium in your diet to 1500 milligrams (  mg) per day. If you have high blood pressure, talk to a registered dietitian about a DASH (Dietary Approaches to Stop Hypertension) eating plan.  Limit sweets and beverages with added sugar, such as soda, to no more than 5 servings per week. One serving is:   1 tablespoon sugar.  1 tablespoon jelly or jam.   cup sorbet.  1 cup lemonade.   cup regular soda. CHOOSING FOODS Starches  Allowed: Breads: All kinds (wheat, rye,  raisin, white, oatmeal, [place], [place], and English muffin bread). Low-fat rolls: English muffins, frankfurter and hamburger buns, bagels, pita bread, tortillas (not fried). Pancakes, waffles, biscuits, and muffins made with recommended oil.  Avoid: Products made with saturated or trans fats, oils, or whole milk products. Butter rolls, cheese breads, croissants. Commercial doughnuts, muffins, sweet rolls, biscuits, waffles, pancakes, store-bought mixes. Crackers  Allowed: Low-fat crackers and snacks: Animal, graham, rye, saltine (with recommended oil, no lard), oyster, and matzo crackers. Bread sticks, melba toast, rusks, flatbread, pretzels, and light popcorn.  Avoid: High-fat crackers: cheese crackers, butter crackers, and those made with coconut, palm oil, or trans fat (hydrogenated oils). Buttered popcorn. Cereals  Allowed: Hot or cold whole-grain cereals.  Avoid: Cereals containing coconut, hydrogenated vegetable fat, or animal fat. Potatoes / Pasta / Rice  Allowed: All kinds of potatoes, rice, and pasta (such as macaroni, spaghetti, and noodles).  Avoid: Pasta or rice prepared with cream sauce or high-fat cheese. Chow mein noodles, [place] fries. Vegetables  Allowed: All vegetables and vegetable juices.  Avoid: Fried vegetables. Vegetables in cream, butter, or high-fat cheese sauces. Limit coconut. Fruit in cream or custard. Protein  Allowed: Limit your intake of meat, seafood, and poultry to no more than 6 oz (cooked weight) per day. All lean, well-trimmed beef, veal, pork, and lamb. All chicken and [place] without skin. All fish and shellfish. Wild game: wild duck, rabbit, pheasant, and venison. Egg whites or low-cholesterol egg substitutes may be used as desired. Meatless dishes: recipes with dried beans, peas, lentils, and tofu (soybean curd). Seeds and nuts: all seeds and most nuts.  Avoid: Prime grade and other heavily marbled and fatty meats, such as short ribs, spare ribs, rib  eye roast or steak, frankfurters, sausage, bacon, and high-fat luncheon meats, mutton. Caviar. Commercially fried fish. Domestic duck, goose, venison sausage. Organ meats: liver, gizzard, heart, chitterlings, brains, kidney, sweetbreads. Dairy  Allowed: Low-fat cheeses: nonfat or low-fat cottage cheese (1% or 2% fat), cheeses made with part skim milk, such as mozzarella, farmers, string, or ricotta. (Cheeses should be labeled no more than 2 to 6 grams fat per oz.). Skim (or 1%) milk: liquid, powdered, or evaporated. Buttermilk made with low-fat milk. Drinks made with skim or low-fat milk or cocoa. Chocolate milk or cocoa made with skim or low-fat (1%) milk. Nonfat or low-fat yogurt.  Avoid: Whole milk cheeses, including colby, cheddar, [place], [address], [place], [place], [place], [address], [place], and blue. Creamed cottage cheese, cream cheese. Whole milk and whole milk products, including buttermilk or yogurt made from whole milk, drinks made from whole milk. Condensed milk, evaporated whole milk, and 2% milk. Soups and Combination Foods  Allowed: Low-fat low-sodium soups: broth, dehydrated soups, homemade broth, soups with the fat removed, homemade cream soups made with skim or low-fat milk. Low-fat spaghetti, lasagna, chili, and Spanish rice if low-fat ingredients and low-fat cooking techniques are used.  Avoid: Cream soups made with whole milk, cream, or high-fat cheese. All other soups. Desserts and Sweets  Allowed: Sherbet, fruit ices, gelatins,  meringues, and angel food cake. Homemade desserts with recommended fats, oils, and milk products. Jam, jelly, honey, marmalade, sugars, and syrups. Pure sugar candy, such as gum drops, hard candy, jelly beans, marshmallows, mints, and small amounts of dark chocolate.  Avoid: Commercially prepared cakes, pies, cookies, frosting, pudding, or mixes for these products. Desserts containing whole milk products, chocolate, coconut, lard, palm oil, or palm  kernel oil. Ice cream or ice cream drinks. Candy that contains chocolate, coconut, butter, hydrogenated fat, or unknown ingredients. Buttered syrups. Fats and Oils  Allowed: Vegetable oils: safflower, sunflower, corn, soybean, cottonseed, sesame, canola, olive, or peanut. Non-hydrogenated margarines. Salad dressing or mayonnaise: homemade or commercial, made with a recommended oil. Low or nonfat salad dressing or mayonnaise.  Limit added fats and oils to 6 to 8 tsp per day (includes fats used in cooking, baking, salads, and spreads on bread). Remember to count the "hidden fats" in foods.  Avoid: Solid fats and shortenings: butter, lard, salt pork, bacon drippings. Gravy containing meat fat, shortening, or suet. Cocoa butter, coconut. Coconut oil, palm oil, palm kernel oil, or hydrogenated oils: these ingredients are often used in bakery products, nondairy creamers, whipped toppings, candy, and commercially fried foods. Read labels carefully. Salad dressings made of unknown oils, sour cream, or cheese, such as blue cheese and Roquefort. Cream, all kinds: half-and-half, light, heavy, or whipping. Sour cream or cream cheese (even if "light" or low-fat). Nondairy cream substitutes: coffee creamers and sour cream substitutes made with palm, palm kernel, hydrogenated oils, or coconut oil. Beverages  Allowed: Coffee (regular or decaffeinated), tea. Diet carbonated beverages, mineral water. Alcohol: Check with your caregiver. Moderation is recommended.  Avoid: Whole milk, regular sodas, and juice drinks with added sugar. Condiments  Allowed: All seasonings and condiments. Cocoa powder. "Cream" sauces made with recommended ingredients.  Avoid: Carob powder made with hydrogenated fats. SAMPLE MENU Breakfast   cup orange juice   cup oatmeal  1 slice toast  1 tsp margarine  1 cup skim milk Lunch  [place] sandwich with 2 oz [place], 2 slices bread  Lettuce and tomato slices  Fresh  fruit  Carrot sticks  Coffee or tea Snack  Fresh fruit or low-fat crackers Dinner  3 oz lean ground beef  1 baked potato  1 tsp margarine   cup asparagus  Lettuce salad  1 tbs non-creamy dressing   cup peach slices  1 cup skim milk Document Released: 01/26/2008 Document Revised: 10/18/2011 Document Reviewed: 07/12/2011 Exit[employer] Patient Information 2014 [place], [place].

## 2012-11-22 NOTE — Assessment & Plan Note (Signed)
Blood pressure was remaining stable.  Is not having any dizziness or syncope.

## 2012-11-22 NOTE — Assessment & Plan Note (Signed)
The patient remains very anxious.  He mentioned that a friend had asked him to appear in court as a character witness.  However I told him that I did not want him to appear in court because I thought it would adversely effect his mental state and aggravate his underlying anxiety further.

## 2012-11-23 ENCOUNTER — Telehealth: Payer: Self-pay | Admitting: Cardiology

## 2012-11-23 NOTE — Telephone Encounter (Signed)
New problem   Per pt he's not feeling well due to b/p-please call

## 2012-11-23 NOTE — Telephone Encounter (Signed)
His chest pain comes on after exercise rather than during exercise.  Perhaps he is exercising for too long at one time.  He should cut down on his time of treadmill walking and do a shorter time more often during the day.

## 2012-11-23 NOTE — Telephone Encounter (Signed)
Was walking on treadmill Monday and had chest pain and forgot to mention. Tuesday and Wednesday ok with walking. Today when he walked he had chest pains about 10 minutes after treadmill. Took about hour before it resolved. Will forward to  Dr. Patty Sermons for review

## 2012-11-23 NOTE — Telephone Encounter (Signed)
Patient also feeling very tired this afternoon

## 2012-11-23 NOTE — Telephone Encounter (Signed)
Advised patient

## 2012-12-05 ENCOUNTER — Ambulatory Visit (INDEPENDENT_AMBULATORY_CARE_PROVIDER_SITE_OTHER): Payer: Medicare Other | Admitting: Pharmacist

## 2012-12-05 DIAGNOSIS — I4891 Unspecified atrial fibrillation: Secondary | ICD-10-CM

## 2012-12-05 LAB — POCT INR: INR: 2.7

## 2012-12-05 NOTE — Patient Instructions (Signed)
Anticoagulation Dose Instructions as of 12/05/2012     Glynis Smiles Tue Wed Thu Fri Sat   New Dose 5 mg 5 mg 5 mg 5 mg 5 mg 5 mg 5 mg    Description       Continue 5mg  1 tablet daily       INR was 2.7  today

## 2012-12-21 ENCOUNTER — Telehealth: Payer: Self-pay | Admitting: Cardiology

## 2012-12-21 NOTE — Telephone Encounter (Signed)
New Prob  Pt states he has been feeling really tired.  He feels nauseous and would like to speak with a nurse. He said the past few days he hasn't been able to breathe that good.

## 2012-12-21 NOTE — Telephone Encounter (Signed)
Spoke with patient who states he has not been feeling well today.  Patient states when he stood up and went outside to water flowers that he felt funny and went in to take BP.  Patient states his BP was 83/50 and he had chest pain.  Patient states he waited a little while and then took ASA and took NTG x 3.  Patient states he rechecked his BP and it was 111/58 and 117 over "something."  Patient states he is feeling better now.  Patient states he has a history of panic attacks and he thinks he may have had a panic attack.  Patient states he cannot tell if the NTG helped his chest pain or not and that he also has a hiatal hernia and he forgot to take his Maalox so the pain could be coming from that.  I advised patient that if pain returns or if he has any further concerns to call 911.  Patient verbalized understanding and agreement with plan.  Patient speaks clearly, denies SOB and nausea and denies chest pain or discomfort currently. Patient verbalized gratitude and states that after talking to me he feels like this was a panic attack.

## 2012-12-21 NOTE — Telephone Encounter (Signed)
Agree. Thanks for talking with him.

## 2013-01-02 ENCOUNTER — Telehealth: Payer: Self-pay | Admitting: Cardiology

## 2013-01-02 NOTE — Telephone Encounter (Signed)
Discussed with  Dr. Patty Sermons and will have patient increase his salt intake a little and call back if no better. Advised patient and he states he is feeling ok.

## 2013-01-02 NOTE — Telephone Encounter (Signed)
Follow up    Patient calling need clarification on blood pressure directions & dosages .

## 2013-01-02 NOTE — Telephone Encounter (Signed)
New Problem   Pt states that Bp is running very low  95/57  94/52   Both times within the 10:30-10:45 time frame

## 2013-01-07 ENCOUNTER — Encounter: Payer: Medicare Other | Admitting: *Deleted

## 2013-01-08 ENCOUNTER — Encounter: Payer: Self-pay | Admitting: *Deleted

## 2013-01-08 ENCOUNTER — Ambulatory Visit (INDEPENDENT_AMBULATORY_CARE_PROVIDER_SITE_OTHER): Payer: Medicare Other | Admitting: Pharmacist Clinician (PhC)/ Clinical Pharmacy Specialist

## 2013-01-08 DIAGNOSIS — I4891 Unspecified atrial fibrillation: Secondary | ICD-10-CM

## 2013-01-11 ENCOUNTER — Ambulatory Visit: Payer: Medicare Other

## 2013-01-11 ENCOUNTER — Encounter: Payer: Self-pay | Admitting: Internal Medicine

## 2013-01-15 ENCOUNTER — Telehealth: Payer: Self-pay | Admitting: Pharmacist Clinician (PhC)/ Clinical Pharmacy Specialist

## 2013-01-15 LAB — REMOTE PACEMAKER DEVICE
AL IMPEDENCE PM: 390 Ohm
ATRIAL PACING PM: 1
BATTERY VOLTAGE: 2.95 V
BRDY-0004RV: 120 {beats}/min
VENTRICULAR PACING PM: 100

## 2013-01-16 ENCOUNTER — Encounter: Payer: Self-pay | Admitting: Internal Medicine

## 2013-01-16 ENCOUNTER — Ambulatory Visit (INDEPENDENT_AMBULATORY_CARE_PROVIDER_SITE_OTHER): Payer: Self-pay | Admitting: *Deleted

## 2013-01-16 DIAGNOSIS — I4891 Unspecified atrial fibrillation: Secondary | ICD-10-CM

## 2013-01-16 NOTE — Patient Instructions (Signed)
Spoke with [facility] Pt should take an extra 1/2 tablet today Then resume regular schedule Rck INR in 3-4 weeks Pt states understanding and agreement to plan

## 2013-01-22 NOTE — Telephone Encounter (Signed)
Continue with regular schedule of coumadin

## 2013-01-23 ENCOUNTER — Ambulatory Visit (INDEPENDENT_AMBULATORY_CARE_PROVIDER_SITE_OTHER): Payer: Medicare Other | Admitting: Cardiology

## 2013-01-23 ENCOUNTER — Encounter: Payer: Self-pay | Admitting: Cardiology

## 2013-01-23 VITALS — BP 115/80 | HR 80 | Ht 71.0 in | Wt 192.0 lb

## 2013-01-23 DIAGNOSIS — I259 Chronic ischemic heart disease, unspecified: Secondary | ICD-10-CM

## 2013-01-23 DIAGNOSIS — I4891 Unspecified atrial fibrillation: Secondary | ICD-10-CM

## 2013-01-23 DIAGNOSIS — K439 Ventral hernia without obstruction or gangrene: Secondary | ICD-10-CM

## 2013-01-23 DIAGNOSIS — I255 Ischemic cardiomyopathy: Secondary | ICD-10-CM

## 2013-01-23 DIAGNOSIS — E785 Hyperlipidemia, unspecified: Secondary | ICD-10-CM

## 2013-01-23 DIAGNOSIS — I2589 Other forms of chronic ischemic heart disease: Secondary | ICD-10-CM

## 2013-01-23 LAB — HEPATIC FUNCTION PANEL
Albumin: 3.8 g/dL (ref 3.5–5.2)
Alkaline Phosphatase: 68 U/L (ref 39–117)
Bilirubin, Direct: 0.2 mg/dL (ref 0.0–0.3)
Total Bilirubin: 1.2 mg/dL (ref 0.3–1.2)

## 2013-01-23 LAB — LIPID PANEL
LDL Cholesterol: 48 mg/dL (ref 0–99)
Total CHOL/HDL Ratio: 3
Triglycerides: 70 mg/dL (ref 0.0–149.0)

## 2013-01-23 LAB — BASIC METABOLIC PANEL
CO2: 31 mEq/L (ref 19–32)
Chloride: 97 mEq/L (ref 96–112)
Creatinine, Ser: 1.1 mg/dL (ref 0.4–1.5)

## 2013-01-23 MED ORDER — GABAPENTIN 100 MG PO CAPS: 100.0000 mg | ORAL_CAPSULE | Freq: Four times a day (QID) | ORAL | Status: DC | PRN

## 2013-01-23 NOTE — Assessment & Plan Note (Signed)
The patient has a history of a ventral hernia.  He has not had any episodes to suggest incarceration of the bowel.  His surgeon has advised conservative treatment and no surgery

## 2013-01-23 NOTE — Assessment & Plan Note (Signed)
The patient has a history of dyslipidemia.  He is tolerating Lipitor 40 mg each day.  We are checking fasting lab work today.  He is not having any myalgias

## 2013-01-23 NOTE — Progress Notes (Signed)
[name] Date of Birth:  1941/02/05 [facility] [address] [place], [place]  [address] [phone]         Fax   [phone]  History of Present Illness: This pleasant 72 year old gentleman is seen for a followup office visit. He was  admitted to [facility] on 03/09/12 and discharged on 03/11/12 he was admitted after he complained of having some chest discomfort at the time of a routine pacemaker check up. He was found to be in atrial fibrillation at the time of admission. On the date of discharge 11//13 the patient underwent a Lexus scan Myoview stress test which revealed left ventricular ejection fraction 32% with septal and inferior wall motion abnormalities and with an inferior lateral scar but no evidence of reversible ischemia. His prior ejection fraction at the time of catheter in 2012 was approximately 40%. 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 His last cardiac catheterization in October 2012 showed that his grafts were patent. The patient also has a history of hypercholesterolemia and essential hypertension. He has had a prior history of paroxysmal atrial flutter with ablation therapy and subsequent heart block which required a [employer] pacemaker which was implanted in 2012. The patient is on long-term Coumadin. He has a lot of anxiety. On 05/25/12 the patient underwent elective cardioversion which was successful. Normal sinus rhythm and was returned as was confirmed by intracardiac electrogram via his pacemaker. However, within 24 hours after the cardioversion he felt that his pulse was irregular again. EKG confirmed that he was back in atrial flutter fibrillation. He was essentially asymptomatic and states that the arrhythmia doesn't really bother him much.  Strategy is rate control and long-term anticoagulation.  He is intolerant of Xarelto because of itching and is back on long-term Coumadin.   Current Outpatient  Prescriptions  Medication Sig Dispense Refill  . acetaminophen (TYLENOL) 325 MG tablet Take 650 mg by mouth 2 (two) times daily as needed. pain      . ALPRAZolam (XANAX) 1 MG tablet Take 1 tablet (1 mg total) by mouth 3 (three) times daily as needed. For anxiety  270 tablet  1  . aspirin EC 81 MG EC tablet Take 1 tablet (81 mg total) by mouth daily.      [name] Kitchen atorvastatin (LIPITOR) 40 MG tablet Take 40 mg by mouth at bedtime.      . carvedilol (COREG) 12.5 MG tablet 1/2 tablet twice a day      . doxepin (SINEQUAN) 25 MG capsule Take 25 mg by mouth daily. Takes 1 tablet in am and 2 tablets in pm      . fluticasone (FLONASE) 50 MCG/ACT nasal spray as needed.       . gabapentin (NEURONTIN) 100 MG capsule Take 1 capsule (100 mg total) by mouth 4 (four) times daily as needed.  120 capsule  5  . hydroxypropyl methylcellulose (ISOPTO TEARS) 2.5 % ophthalmic solution Place 1 drop into both eyes 4 (four) times daily as needed. Dry eyes      . isosorbide mononitrate (IMDUR) 60 MG 24 hr tablet Take 1 tablet (60 mg total) by mouth daily.  90 tablet  3  . lisinopril (PRINIVIL,ZESTRIL) 5 MG tablet Take 1 tablet (5 mg total) by mouth 2 (two) times daily.  180 tablet  30  . loratadine (CLARITIN) 10 MG tablet Take 10 mg by mouth daily.      . Multiple Vitamin (MULTIVITAMIN)  tablet Take 1 tablet by mouth daily.        . nitroGLYCERIN (NITROSTAT) 0.4 MG SL tablet Place 0.4 mg under the tongue every 5 (five) minutes as needed. For chest pain      . omeprazole (PRILOSEC) 20 MG capsule Take 20 mg by mouth 2 (two) times daily.       . sennosides-docusate sodium (SENOKOT-S) 8.6-50 MG tablet Take 1 tablet by mouth daily.      . sucralfate (CARAFATE) 1 G tablet Take 1 tablet (1 g total) by mouth 4 (four) times daily -  before meals and at bedtime.  120 tablet  3  . warfarin (COUMADIN) 5 MG tablet Take 5 mg by mouth at bedtime.        No current facility-administered medications for this visit.    Allergies  Allergen  Reactions  . Sulfa Drugs Cross Reactors Other (See Comments)    unknown  . Ultram [Tramadol Hcl] Itching    "don't remember how bad"  . Xarelto [Rivaroxaban] Other (See Comments)    Itching     Patient Active Problem List   Diagnosis Date Noted  . Cough, persistent 11/15/2010    Priority: High  . Paroxysmal atrial flutter 11/15/2010    Priority: High  . Hx of CABG 09/23/2010    Priority: High  . Hypertension     Priority: High  . Anxiety     Priority: High  . Hyperlipidemia     Priority: Medium  . Angina pectoris 08/30/2012  . Chronic systolic CHF (congestive heart failure), NYHA class 2   . Chronic anticoagulation   . Atypical chest pain 03/11/2012  . Umbilical hernia 01/05/2012  . CAD (coronary artery disease) 10/20/2011  . Atrial fibrillation 05/31/2011  . 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 Pacemaker-stJudes 05/31/2011  . Chest pain 04/11/2011  . Ischemic cardiomyopathy   . GERD (gastroesophageal reflux disease)   . Fibromyalgia     History  Smoking status  . Former Smoker -- 0.50 packs/day for 10 years  . Types: Cigarettes  . Quit date: 09/21/1973  Smokeless tobacco  . Never Used    History  Alcohol Use No    Family History  Problem Relation Age of Onset  . Stroke Mother   . Heart attack Father     Review of Systems: Constitutional: no fever chills diaphoresis or fatigue or change in weight.  Head and neck: no hearing loss, no epistaxis, no photophobia or visual disturbance. Respiratory: No cough, shortness of breath or wheezing. Cardiovascular: No chest pain peripheral edema, palpitations. Gastrointestinal: No abdominal distention, no abdominal pain, no change in bowel habits hematochezia or melena. Genitourinary: No dysuria, no frequency, no urgency, no nocturia. Musculoskeletal:No arthralgias, no back pain, no gait disturbance or myalgias. Neurological: No dizziness, no headaches, no numbness, no seizures, no syncope, no weakness,  no tremors. Hematologic: No lymphadenopathy, no easy bruising. Psychiatric: No confusion, no hallucinations, no sleep disturbance.    Physical Exam: Filed Vitals:   01/23/13 1025  BP: 115/80  Pulse: 80   the general appearance reveals a well-developed well-nourished talkative gentleman in no distress.The head and neck exam reveals pupils equal and reactive.  Extraocular movements are full.  There is no scleral icterus.  The mouth and pharynx are normal.  The neck is supple.  The carotids reveal no bruits.  The jugular venous pressure is normal.  The  thyroid is not enlarged.  There is no lymphadenopathy.  The chest is clear to percussion and  auscultation.  There are no rales or rhonchi.  Expansion of the chest is symmetrical.  The precordium is quiet.  The first heart sound is normal.  The second heart sound is physiologically split.  There is no murmur gallop rub or click.  There is no abnormal lift or heave.  The abdomen is soft and nontender.  The bowel sounds are normal.  The liver and spleen are not enlarged.  There is a lemon-sized ventral hernia present which is not tender.  There are no abdominal bruits.  Extremities reveal good pedal pulses.  There is no phlebitis or edema.  There is no cyanosis or clubbing.  Strength is normal and symmetrical in all extremities.  There is no lateralizing weakness.  There are no sensory deficits.  The skin is warm and dry.  There is no rash.    Assessment / Plan:  Continue on same medication.  We're checking fasting lab work today.  Recheck in 2 months for office visit and EKG.  We refilled his gabapentin 100 mg 4 times a day when necessary which he uses for headaches.

## 2013-01-23 NOTE — Patient Instructions (Addendum)
Will obtain labs today and call you with the results (LP/HFP/BMET)  Your physician recommends that you continue on your current medications as directed. Please refer to the Current Medication list given to you today.  Your physician wants you to follow-up in: 2 MONTH OV/EKG You will receive a reminder letter in the mail two months in advance. If you don't receive a letter, please call our office to schedule the follow-up appointment.

## 2013-01-23 NOTE — Assessment & Plan Note (Signed)
The patient is in permanent atrial fibrillation.  He has an underlying functioning pacemaker.  He has not been having any dizzy spells or syncope

## 2013-01-23 NOTE — Progress Notes (Signed)
Quick Note:  Please report to patient. The recent labs are stable. Continue same medication and careful diet.Serum sodium is low so he can eat slightly more salt on food. ______

## 2013-01-23 NOTE — Assessment & Plan Note (Signed)
Patient has not been experiencing any severe chest discomfort since last visit.  He has been walking a mile a day on his treadmill.

## 2013-01-25 ENCOUNTER — Telehealth: Payer: Self-pay | Admitting: *Deleted

## 2013-01-25 NOTE — Telephone Encounter (Signed)
Message copied by Burnell Blanks on Fri Jan 25, 2013  9:32 AM ------      Message from: Cassell Clement      Created: Wed Jan 23, 2013  8:00 PM       Please report to patient.  The recent labs are stable. Continue same medication and careful diet.Serum sodium is low so he can eat slightly more salt on food. ------

## 2013-01-25 NOTE — Telephone Encounter (Signed)
Advised patient of lab results  

## 2013-01-29 ENCOUNTER — Ambulatory Visit: Payer: Medicare Other | Admitting: Nurse Practitioner

## 2013-02-04 ENCOUNTER — Ambulatory Visit (INDEPENDENT_AMBULATORY_CARE_PROVIDER_SITE_OTHER): Payer: Medicare Other

## 2013-02-04 ENCOUNTER — Encounter: Payer: Self-pay | Admitting: *Deleted

## 2013-02-04 DIAGNOSIS — Z23 Encounter for immunization: Secondary | ICD-10-CM

## 2013-02-14 ENCOUNTER — Encounter (INDEPENDENT_AMBULATORY_CARE_PROVIDER_SITE_OTHER): Payer: Self-pay

## 2013-02-14 ENCOUNTER — Ambulatory Visit (INDEPENDENT_AMBULATORY_CARE_PROVIDER_SITE_OTHER): Payer: Medicare Other | Admitting: Pharmacist

## 2013-02-14 DIAGNOSIS — I4891 Unspecified atrial fibrillation: Secondary | ICD-10-CM

## 2013-02-14 NOTE — Patient Instructions (Signed)
Anticoagulation Dose Instructions as of 02/14/2013     Glynis Smiles Tue Wed Thu Fri Sat   New Dose 5 mg 5 mg 5 mg 5 mg 5 mg 5 mg 5 mg    Description       Continue 5mg  1 tablet daily      INR was 2.2 today

## 2013-03-05 ENCOUNTER — Ambulatory Visit: Payer: Medicare Other | Admitting: Family Medicine

## 2013-03-19 ENCOUNTER — Encounter: Payer: Medicare Other | Admitting: Internal Medicine

## 2013-03-21 ENCOUNTER — Ambulatory Visit: Payer: Medicare Other | Admitting: Cardiology

## 2013-03-25 ENCOUNTER — Encounter: Payer: Self-pay | Admitting: Cardiology

## 2013-03-25 ENCOUNTER — Ambulatory Visit (INDEPENDENT_AMBULATORY_CARE_PROVIDER_SITE_OTHER): Payer: Medicare Other | Admitting: Internal Medicine

## 2013-03-25 ENCOUNTER — Ambulatory Visit (INDEPENDENT_AMBULATORY_CARE_PROVIDER_SITE_OTHER): Payer: Medicare Other | Admitting: Cardiology

## 2013-03-25 ENCOUNTER — Encounter: Payer: Self-pay | Admitting: Internal Medicine

## 2013-03-25 VITALS — BP 118/74 | HR 60 | Ht 71.0 in | Wt 191.0 lb

## 2013-03-25 VITALS — BP 124/74 | HR 70 | Ht 71.0 in | Wt 191.0 lb

## 2013-03-25 DIAGNOSIS — F411 Generalized anxiety disorder: Secondary | ICD-10-CM

## 2013-03-25 DIAGNOSIS — I2589 Other forms of chronic ischemic heart disease: Secondary | ICD-10-CM

## 2013-03-25 DIAGNOSIS — I442 Atrioventricular block, complete: Secondary | ICD-10-CM

## 2013-03-25 DIAGNOSIS — E785 Hyperlipidemia, unspecified: Secondary | ICD-10-CM

## 2013-03-25 DIAGNOSIS — F419 Anxiety disorder, unspecified: Secondary | ICD-10-CM

## 2013-03-25 DIAGNOSIS — I4891 Unspecified atrial fibrillation: Secondary | ICD-10-CM

## 2013-03-25 DIAGNOSIS — I259 Chronic ischemic heart disease, unspecified: Secondary | ICD-10-CM

## 2013-03-25 DIAGNOSIS — I119 Hypertensive heart disease without heart failure: Secondary | ICD-10-CM

## 2013-03-25 DIAGNOSIS — Z950 Presence of cardiac pacemaker: Secondary | ICD-10-CM

## 2013-03-25 DIAGNOSIS — I255 Ischemic cardiomyopathy: Secondary | ICD-10-CM

## 2013-03-25 LAB — MDC_IDC_ENUM_SESS_TYPE_INCLINIC
Battery Remaining Longevity: 84 mo
Implantable Pulse Generator Model: 2110
Implantable Pulse Generator Serial Number: 7282780
Lead Channel Pacing Threshold Amplitude: 0.75 V
Lead Channel Sensing Intrinsic Amplitude: 12 mV
Lead Channel Setting Pacing Amplitude: 2.5 V

## 2013-03-25 MED ORDER — OMEPRAZOLE 20 MG PO CPDR: 20.0000 mg | DELAYED_RELEASE_CAPSULE | Freq: Two times a day (BID) | ORAL | Status: AC

## 2013-03-25 MED ORDER — ALPRAZOLAM 1 MG PO TABS: 1.0000 mg | ORAL_TABLET | Freq: Three times a day (TID) | ORAL | Status: DC | PRN

## 2013-03-25 MED ORDER — DOXEPIN HCL 25 MG PO CAPS: ORAL_CAPSULE | ORAL | Status: DC

## 2013-03-25 MED ORDER — CARVEDILOL 12.5 MG PO TABS: ORAL_TABLET | ORAL | Status: DC

## 2013-03-25 NOTE — Assessment & Plan Note (Signed)
The patient has not been having any symptoms of CHF.  He continues to exercise regularly on his home treadmill

## 2013-03-25 NOTE — Patient Instructions (Signed)
Your physician recommends that you continue on your current medications as directed. Please refer to the Current Medication list given to you today.  Your physician wants you to follow-up in: one year with Brooke Edmisten, PAC.  You will receive a reminder letter in the mail two months in advance. If you don't receive a letter, please call our office to schedule the follow-up appointment.  

## 2013-03-25 NOTE — Assessment & Plan Note (Signed)
As above.

## 2013-03-25 NOTE — Assessment & Plan Note (Signed)
Because of his myalgias we are cutting back on his Lipitor to just 20 mg daily.  His lipids have been very low.  We will recheck lipid panel at his next visit in 3 months

## 2013-03-25 NOTE — Progress Notes (Signed)
[name] Date of Birth:  1940-10-11 [address] [place], [place]  [address] [phone]         Fax   [phone]  History of Present Illness: This pleasant 72 year old gentleman is seen for a followup office visit. He was  admitted to [facility] on 03/09/12 and discharged on 03/11/12 he was admitted after he complained of having some chest discomfort at the time of a routine pacemaker check up. He was found to be in atrial fibrillation at the time of admission. On the date of discharge 11//13 the patient underwent a Lexus scan Myoview stress test which revealed left ventricular ejection fraction 32% with septal and inferior wall motion abnormalities and with an inferior lateral scar but no evidence of reversible ischemia. His prior ejection fraction at the time of catheter in 2012 was approximately 40%. 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 His last cardiac catheterization in October 2012 showed that his grafts were patent. The patient also has a history of hypercholesterolemia and essential hypertension. He has had a prior history of paroxysmal atrial flutter with ablation therapy and subsequent heart block which required a [employer] pacemaker which was implanted in 2012. The patient is on long-term Coumadin. He has a lot of anxiety. On 05/25/12 the patient underwent elective cardioversion which was successful. Normal sinus rhythm and was returned as was confirmed by intracardiac electrogram via his pacemaker. However, within 24 hours after the cardioversion he felt that his pulse was irregular again. EKG confirmed that he was back in atrial flutter fibrillation. He was essentially asymptomatic and states that the arrhythmia doesn't really bother him much.  Strategy is rate control and long-term anticoagulation.  He is intolerant of Xarelto because of itching and is back on long-term Coumadin.  Since last visit he has been doing well.  He has  had some atypical chest discomfort relieved by Maalox.  He is not having any new anginal symptoms.  He's had some questionable mild myalgias which may be due to his statin therapy.  Current Outpatient Prescriptions  Medication Sig Dispense Refill  . acetaminophen (TYLENOL) 325 MG tablet Take 650 mg by mouth 2 (two) times daily as needed. pain      . ALPRAZolam (XANAX) 1 MG tablet Take 1 tablet (1 mg total) by mouth 3 (three) times daily as needed. For anxiety  270 tablet  1  . atorvastatin (LIPITOR) 40 MG tablet Take 40 mg by mouth as directed. 1/2 TABLET DAILY      . carvedilol (COREG) 12.5 MG tablet 1/2 tablet twice a day  90 tablet  3  . doxepin (SINEQUAN) 25 MG capsule Takes 1 tablet in am and 2 tablets in pm  270 capsule  3  . fluticasone (FLONASE) 50 MCG/ACT nasal spray as needed.       . gabapentin (NEURONTIN) 100 MG capsule Take 1 capsule (100 mg total) by mouth 4 (four) times daily as needed.  120 capsule  5  . hydroxypropyl methylcellulose (ISOPTO TEARS) 2.5 % ophthalmic solution Place 1 drop into both eyes 4 (four) times daily as needed. Dry eyes      . isosorbide mononitrate (IMDUR) 60 MG 24 hr tablet Take 1 tablet (60 mg total) by mouth daily.  90 tablet  3  . lisinopril (PRINIVIL,ZESTRIL) 5 MG tablet Take 1 tablet (5 mg total) by mouth 2 (two) times daily.  180 tablet  30  .  loratadine (CLARITIN) 10 MG tablet Take 10 mg by mouth daily.      . Multiple Vitamin (MULTIVITAMIN) tablet Take 1 tablet by mouth daily.        . nitroGLYCERIN (NITROSTAT) 0.4 MG SL tablet Place 0.4 mg under the tongue every 5 (five) minutes as needed. For chest pain      . omeprazole (PRILOSEC) 20 MG capsule Take 1 capsule (20 mg total) by mouth 2 (two) times daily.  180 capsule  3  . sennosides-docusate sodium (SENOKOT-S) 8.6-50 MG tablet Take 1 tablet by mouth daily.      . sucralfate (CARAFATE) 1 G tablet Take 1 tablet (1 g total) by mouth 4 (four) times daily -  before meals and at bedtime.  120 tablet  3    . warfarin (COUMADIN) 5 MG tablet Take 5 mg by mouth at bedtime.        No current facility-administered medications for this visit.    Allergies  Allergen Reactions  . Sulfa Drugs Cross Reactors Other (See Comments)    unknown  . Ultram [Tramadol Hcl] Itching    "don't remember how bad"  . Xarelto [Rivaroxaban] Other (See Comments)    Itching     Patient Active Problem List   Diagnosis Date Noted  . Cough, persistent 11/15/2010    Priority: High  . Hx of CABG 09/23/2010    Priority: High  . Hypertension     Priority: High  . Anxiety     Priority: High  . Hyperlipidemia     Priority: Medium  . Ventral hernia 01/23/2013  . Angina pectoris 08/30/2012  . Chronic systolic CHF (congestive heart failure), NYHA class 2   . Chronic anticoagulation   . Atypical chest pain 03/11/2012  . Umbilical hernia 01/05/2012  . CAD (coronary artery disease) 10/20/2011  . Atrial fibrillation-Permanent 05/31/2011  . 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 Pacemaker-stJudes 05/31/2011  . Chest pain 04/11/2011  . Ischemic cardiomyopathy   . GERD (gastroesophageal reflux disease)   . Fibromyalgia     History  Smoking status  . Former Smoker -- 0.50 packs/day for 10 years  . Types: Cigarettes  . Quit date: 09/21/1973  Smokeless tobacco  . Never Used    History  Alcohol Use No    Family History  Problem Relation Age of Onset  . Stroke Mother   . Heart attack Father     Review of Systems: Constitutional: no fever chills diaphoresis or fatigue or change in weight.  Head and neck: no hearing loss, no epistaxis, no photophobia or visual disturbance. Respiratory: No cough, shortness of breath or wheezing. Cardiovascular: No chest pain peripheral edema, palpitations. Gastrointestinal: No abdominal distention, no abdominal pain, no change in bowel habits hematochezia or melena. Genitourinary: No dysuria, no frequency, no urgency, no nocturia. Musculoskeletal:No  arthralgias, no back pain, no gait disturbance or myalgias. Neurological: No dizziness, no headaches, no numbness, no seizures, no syncope, no weakness, no tremors. Hematologic: No lymphadenopathy, no easy bruising. Psychiatric: No confusion, no hallucinations, no sleep disturbance.    Physical Exam: Filed Vitals:   03/25/13 1406  BP: 118/74  Pulse: 60   the general appearance reveals a well-developed well-nourished talkative gentleman in no distress.The head and neck exam reveals pupils equal and reactive.  Extraocular movements are full.  There is no scleral icterus.  The mouth and pharynx are normal.  The neck is supple.  The carotids reveal no bruits.  The jugular venous pressure is normal.  The  thyroid is not enlarged.  There is no lymphadenopathy.  The chest is clear to percussion and auscultation.  There are no rales or rhonchi.  Expansion of the chest is symmetrical.  The precordium is quiet.  The first heart sound is normal.  The second heart sound is physiologically split.  There is no murmur gallop rub or click.  There is no abnormal lift or heave.  The abdomen is soft and nontender.  The bowel sounds are normal.  The liver and spleen are not enlarged.  There is a lemon-sized ventral hernia present which is not tender.  There are no abdominal bruits.  Extremities reveal good pedal pulses.  There is no phlebitis or edema.  There is no cyanosis or clubbing.  Strength is normal and symmetrical in all extremities.  There is no lateralizing weakness.  There are no sensory deficits.  The skin is warm and dry.  There is no rash.  EKG today shows underlying atrial fibrillation and paced ventricular rhythm  Assessment / Plan:  Continue present medication except stop aspirin and continue long-term Coumadin for his permanent atrial fibrillation.  Decreased Lipitor to 20 mg daily.  Recheck in 3 months for followup office visit lipid panel hepatic function panel and basal metabolic panel.

## 2013-03-25 NOTE — Assessment & Plan Note (Signed)
The patient's device was interrogated and the information was fully reviewed.  The device was reprogrammed to VVIR mode 2/2 permanent afib

## 2013-03-25 NOTE — Assessment & Plan Note (Signed)
The patient has not had any TIA symptoms.  Since he is on long-term warfarin he no longer needs to also take aspirin which will be stopped

## 2013-03-25 NOTE — Assessment & Plan Note (Signed)
Continue current meds Will ask Dr TB to consider the discontinuation of ASA in conjunction with warfarin

## 2013-03-25 NOTE — Patient Instructions (Addendum)
STOP OUR ASPIRIN   DECREASE YOUR LIPITOR TO 20 MG DAILY   Your physician recommends that you schedule a follow-up appointment in: 3 months with fasting labs (LP/BMET/HFP)   Basic Carbohydrate Counting Basic carbohydrate counting is a way to plan meals. It is done by counting the amount of carbohydrate in foods. Foods that have carbohydrates are starches (grains, beans, starchy vegetables) and sweets. Eating carbohydrates increases blood glucose (sugar) levels. People with diabetes use carbohydrate counting to help keep their blood glucose at a normal level.  COUNTING CARBOHYDRATES IN FOODS The first step in counting carbohydrates is to learn how many carbohydrate servings you should have in every meal. A dietitian can plan this for you. After learning the amount of carbohydrates to include in your meal plan, you can start to choose the carbohydrate-containing foods you want to eat.  There are 2 ways to identify the amount of carbohydrates in the foods you eat.  Read the Nutrition Facts panel on food labels. You need 2 pieces of information from the Nutrition Facts panel to count carbohydrates this way:  Serving size.  Total carbohydrate (in grams). Decide how many servings you will be eating. If it is 1 serving, you will be eating the amount of carbohydrate listed on the panel. If you will be eating 2 servings, you will be eating double the amount of carbohydrate listed on the panel.   Learn serving sizes. A serving size of most carbohydrate-containing foods is about 15 grams (g). Listed below are single serving sizes of common carbohydrate-containing foods:  1 slice bread.   cup unsweetened, dry cereal.   cup hot cereal.   cup rice.   cup mashed potatoes.   cup pasta.  1 cup fresh fruit.   cup canned fruit.  1 cup milk (whole, 2%, or skim).   cup starchy vegetables (peas, corn, or potatoes). Counting carbohydrates this way is similar to looking on the Nutrition Facts  panel. Decide how many servings you will eat first. Multiply the number of servings you eat by 15 g. For example, if you have 2 cups of strawberries, you had 2 servings. That means you had 30 g of carbohydrate (2 servings x 15 g = 30 g). CALCULATING CARBOHYDRATES IN A MEAL Sample dinner  3 oz chicken breast.   cup brown rice.   cup corn.  1 cup fat-free milk.  1 cup strawberries with sugar-free whipped topping. Carbohydrate calculation First, identify the foods that contain carbohydrate:  Rice.  Corn.  Milk.  Strawberries. Calculate the number of servings eaten:  2 servings rice.  1 serving corn.  1 serving milk.  1 serving strawberries. Multiply the number of servings by 15 g:  2 servings rice x 15 g = 30 g.  1 serving corn x 15 g = 15 g.  1 serving milk x 15 g = 15 g.  1 serving strawberries x 15 g = 15 g. Add the amounts to find the total carbohydrates eaten: 30 g + 15 g + 15 g + 15 g = 75 g carbohydrate eaten at dinner. Document Released: 04/18/2005 Document Revised: 07/11/2011 Document Reviewed: 03/04/2011 [facility] Patient Information 2014 [place], [place].

## 2013-03-25 NOTE — Progress Notes (Signed)
Patient Care Team: Ileana Ladd, MD as PCP - General (Family Medicine)   HPI  [name] is a 72 y.o. male Seen in followup for a pacemaker implanted following intermittent complete heart block associated with ablation of atrial flutter. He received a single RF lesion. No recovery of conduction and he underwent pacing the following day. This in 2012. He is on Coumadin and asa  ,The patient denies chest pain, shortness of breath, nocturnal dyspnea, orthopnea or peripheral edema.  There have been no palpitations, lightheadedness or syncope.    Is a history of ischemic heart disease with prior bypass surgery and redo bypass 2007. He underwent Myoview scanning 11/13 which demonstrated ejection fraction 32% inferolateral scar but no ischemia.   Past Medical History  Diagnosis Date  . Ischemic cardiomyopathy     EF 40% by heart cath 01/2011   . Hypertension   . Hyperlipidemia   . GERD (gastroesophageal reflux disease)   . CAD (coronary artery disease)     s/p CABGx4 in 1991, with redo surgery in 2007 with  SVG to an OM and sequential SVG to the PDA and PLV, recent cath 01/2011 with patent grafts  . Atrial flutter October 2012    S/P ablation - not successful  . CHB (complete heart block) October 2012    S/P PTVP following ablation  . Kidney cysts     CYST ON BOTH KIDNEYS  . Hiatal hernia   . Other "heavy-for-dates" infants elevated PSA level  . A-fib   . Anginal pain   . Elevated PSA, less than 10 ng/ml 10/2011    "went from 5 to 9"  . Pacemaker   . Fibromyalgia   . Skin cancer     'above left eyebrow; tip of my nose" (08/29/2012)  . Shortness of breath     "recently" (08/29/2012)  . Depression     "since 1986-1987" (08/29/2012)  . Anxiety     "since [id number]" (08/29/2012)  . Panic attacks     "2 since 03/2012" (08/29/2012)  . Chronic systolic CHF (congestive heart failure), NYHA class 2   . Chronic anticoagulation     Coumadin    Past Surgical History    Procedure Laterality Date  . Coronary artery bypass graft  1991    CABG X 5  . Coronary artery bypass graft  06/2005    CABG X 3  . Cardiac catheterization  10/30/2009    Grafts patent. Mild to moderate LV dysfunction. EF 45%. Managed medically.   . Cardiac catheterization      multiple  . Tonsillectomy  1960  . Cholecystectomy  2007  . Insert / replace / remove pacemaker  01/2011    initial placement  . Inguinal hernia repair  1960's    "left I think"  . Cataract extraction w/ intraocular lens  implant, bilateral  2012  . Cardioversion  05/25/2012    Procedure: CARDIOVERSION;  Surgeon: Cassell Clement, MD;  Location[facility] ENDOSCOPY;  Service: Cardiovascular;  Laterality: N/A;  . Humerus surgery Left 1960    "growth on the bone taken off; not cancer" (08/29/2012)  . Excisional hemorrhoidectomy  ~ 1980  . Skin cancer excision      'above left eyebrow; tip of my nose" (08/29/2012)  . Atrial flutter ablation  01/2011    "didn't work" (08/29/2012)    Current Outpatient Prescriptions  Medication Sig Dispense Refill  . acetaminophen (TYLENOL) 325 MG tablet Take 650 mg by mouth  2 (two) times daily as needed. pain      . ALPRAZolam (XANAX) 1 MG tablet Take 1 tablet (1 mg total) by mouth 3 (three) times daily as needed. For anxiety  270 tablet  1  . aspirin EC 81 MG EC tablet Take 1 tablet (81 mg total) by mouth daily.      [name] Kitchen atorvastatin (LIPITOR) 40 MG tablet Take 40 mg by mouth at bedtime.      . carvedilol (COREG) 12.5 MG tablet 1/2 tablet twice a day      . doxepin (SINEQUAN) 25 MG capsule Take 25 mg by mouth daily. Takes 1 tablet in am and 2 tablets in pm      . fluticasone (FLONASE) 50 MCG/ACT nasal spray as needed.       . gabapentin (NEURONTIN) 100 MG capsule Take 1 capsule (100 mg total) by mouth 4 (four) times daily as needed.  120 capsule  5  . hydroxypropyl methylcellulose (ISOPTO TEARS) 2.5 % ophthalmic solution Place 1 drop into both eyes 4 (four) times daily as needed. Dry eyes       . isosorbide mononitrate (IMDUR) 60 MG 24 hr tablet Take 1 tablet (60 mg total) by mouth daily.  90 tablet  3  . lisinopril (PRINIVIL,ZESTRIL) 5 MG tablet Take 1 tablet (5 mg total) by mouth 2 (two) times daily.  180 tablet  30  . loratadine (CLARITIN) 10 MG tablet Take 10 mg by mouth daily.      . Multiple Vitamin (MULTIVITAMIN) tablet Take 1 tablet by mouth daily.        . nitroGLYCERIN (NITROSTAT) 0.4 MG SL tablet Place 0.4 mg under the tongue every 5 (five) minutes as needed. For chest pain      . omeprazole (PRILOSEC) 20 MG capsule Take 20 mg by mouth 2 (two) times daily.       . sennosides-docusate sodium (SENOKOT-S) 8.6-50 MG tablet Take 1 tablet by mouth daily.      . sucralfate (CARAFATE) 1 G tablet Take 1 tablet (1 g total) by mouth 4 (four) times daily -  before meals and at bedtime.  120 tablet  3  . warfarin (COUMADIN) 5 MG tablet Take 5 mg by mouth at bedtime.        No current facility-administered medications for this visit.    Allergies  Allergen Reactions  . Sulfa Drugs Cross Reactors Other (See Comments)    unknown  . Ultram [Tramadol Hcl] Itching    "don't remember how bad"  . Xarelto [Rivaroxaban] Other (See Comments)    Itching     Review of Systems negative except from HPI and PMH  Physical Exam BP 124/74  Pulse 70  Ht 5\' 11"  (1.803 m)  Wt 191 lb (86.637 kg)  BMI 26.65 kg/m2 Well developed and well nourished in no acute distress HENT normal E scleral and icterus clear Neck Supple Clear to ausculation  Regular rate and rhythm, no murmurs gallops or rub Soft with active bowel sounds No clubbing cyanosis none Edema Alert and oriented, grossly normal motor and sensory function Skin Warm and Dry    Assessment and  Plan

## 2013-03-26 ENCOUNTER — Emergency Department (HOSPITAL_COMMUNITY): Payer: Medicare Other

## 2013-03-26 ENCOUNTER — Encounter (HOSPITAL_COMMUNITY): Payer: Self-pay | Admitting: Emergency Medicine

## 2013-03-26 ENCOUNTER — Observation Stay (HOSPITAL_COMMUNITY)
Admission: EM | Admit: 2013-03-26 | Discharge: 2013-03-27 | Disposition: A | Discharge status: Home / Self Care | Payer: Medicare Other | Attending: Cardiovascular Disease | Admitting: Cardiovascular Disease

## 2013-03-26 DIAGNOSIS — I2589 Other forms of chronic ischemic heart disease: Secondary | ICD-10-CM | POA: Insufficient documentation

## 2013-03-26 DIAGNOSIS — Z951 Presence of aortocoronary bypass graft: Secondary | ICD-10-CM

## 2013-03-26 DIAGNOSIS — R079 Chest pain, unspecified: Principal | ICD-10-CM | POA: Insufficient documentation

## 2013-03-26 DIAGNOSIS — K219 Gastro-esophageal reflux disease without esophagitis: Secondary | ICD-10-CM | POA: Diagnosis present

## 2013-03-26 DIAGNOSIS — Z9849 Cataract extraction status, unspecified eye: Secondary | ICD-10-CM | POA: Insufficient documentation

## 2013-03-26 DIAGNOSIS — F411 Generalized anxiety disorder: Secondary | ICD-10-CM | POA: Insufficient documentation

## 2013-03-26 DIAGNOSIS — Z961 Presence of intraocular lens: Secondary | ICD-10-CM | POA: Insufficient documentation

## 2013-03-26 DIAGNOSIS — Q618 Other cystic kidney diseases: Secondary | ICD-10-CM | POA: Insufficient documentation

## 2013-03-26 DIAGNOSIS — Z85820 Personal history of malignant melanoma of skin: Secondary | ICD-10-CM | POA: Insufficient documentation

## 2013-03-26 DIAGNOSIS — I255 Ischemic cardiomyopathy: Secondary | ICD-10-CM | POA: Diagnosis present

## 2013-03-26 DIAGNOSIS — I2510 Atherosclerotic heart disease of native coronary artery without angina pectoris: Secondary | ICD-10-CM | POA: Diagnosis present

## 2013-03-26 DIAGNOSIS — Z9889 Other specified postprocedural states: Secondary | ICD-10-CM | POA: Insufficient documentation

## 2013-03-26 DIAGNOSIS — IMO0001 Reserved for inherently not codable concepts without codable children: Secondary | ICD-10-CM | POA: Insufficient documentation

## 2013-03-26 DIAGNOSIS — M797 Fibromyalgia: Secondary | ICD-10-CM | POA: Diagnosis present

## 2013-03-26 DIAGNOSIS — F410 Panic disorder [episodic paroxysmal anxiety] without agoraphobia: Secondary | ICD-10-CM | POA: Insufficient documentation

## 2013-03-26 DIAGNOSIS — I4891 Unspecified atrial fibrillation: Secondary | ICD-10-CM | POA: Diagnosis present

## 2013-03-26 DIAGNOSIS — R0789 Other chest pain: Secondary | ICD-10-CM

## 2013-03-26 DIAGNOSIS — Z7901 Long term (current) use of anticoagulants: Secondary | ICD-10-CM

## 2013-03-26 DIAGNOSIS — R972 Elevated prostate specific antigen [PSA]: Secondary | ICD-10-CM | POA: Insufficient documentation

## 2013-03-26 DIAGNOSIS — R0602 Shortness of breath: Secondary | ICD-10-CM | POA: Insufficient documentation

## 2013-03-26 DIAGNOSIS — I10 Essential (primary) hypertension: Secondary | ICD-10-CM | POA: Diagnosis present

## 2013-03-26 DIAGNOSIS — I509 Heart failure, unspecified: Secondary | ICD-10-CM | POA: Insufficient documentation

## 2013-03-26 DIAGNOSIS — E785 Hyperlipidemia, unspecified: Secondary | ICD-10-CM | POA: Diagnosis present

## 2013-03-26 DIAGNOSIS — Z45018 Encounter for adjustment and management of other part of cardiac pacemaker: Secondary | ICD-10-CM | POA: Insufficient documentation

## 2013-03-26 DIAGNOSIS — I4892 Unspecified atrial flutter: Secondary | ICD-10-CM | POA: Insufficient documentation

## 2013-03-26 DIAGNOSIS — Z95810 Presence of automatic (implantable) cardiac defibrillator: Secondary | ICD-10-CM | POA: Diagnosis present

## 2013-03-26 HISTORY — DX: Benign prostatic hyperplasia without lower urinary tract symptoms: N40.0

## 2013-03-26 LAB — COMPREHENSIVE METABOLIC PANEL
ALT: 19 U/L (ref 0–53)
Alkaline Phosphatase: 71 U/L (ref 39–117)
CO2: 30 mEq/L (ref 19–32)
GFR calc Af Amer: 63 mL/min — ABNORMAL LOW (ref >90)
GFR calc non Af Amer: 55 mL/min — ABNORMAL LOW (ref >90)
Glucose, Bld: 91 mg/dL (ref 70–99)
Potassium: 4.8 mEq/L (ref 3.5–5.1)
Sodium: 129 mEq/L — ABNORMAL LOW (ref 135–145)

## 2013-03-26 LAB — POCT I-STAT, CHEM 8
BUN: 21 mg/dL (ref 6–23)
Calcium, Ion: 1.19 mmol/L (ref 1.13–1.30)
Chloride: 93 mEq/L — ABNORMAL LOW (ref 96–112)
Glucose, Bld: 90 mg/dL (ref 70–99)
HCT: 45 % (ref 39.0–52.0)
Hemoglobin: 15.3 g/dL (ref 13.0–17.0)
Potassium: 4.7 mEq/L (ref 3.5–5.1)
Sodium: 130 mEq/L — ABNORMAL LOW (ref 135–145)

## 2013-03-26 LAB — TROPONIN I
Troponin I: <0.3 ng/mL (ref <0.30)
Troponin I: <0.3 ng/mL (ref <0.30)

## 2013-03-26 LAB — CBC WITH DIFFERENTIAL/PLATELET
Basophils Absolute: 0 10*3/uL (ref 0.0–0.1)
Basophils Relative: 0 % (ref 0–1)
MCHC: 34.4 g/dL (ref 30.0–36.0)
Monocytes Absolute: 0.5 10*3/uL (ref 0.1–1.0)
Neutro Abs: 3.6 10*3/uL (ref 1.7–7.7)
Neutrophils Relative %: 57 % (ref 43–77)
RDW: 13.2 % (ref 11.5–15.5)

## 2013-03-26 LAB — POCT I-STAT TROPONIN I: Troponin i, poc: 0 ng/mL (ref 0.00–0.08)

## 2013-03-26 MED ORDER — SODIUM CHLORIDE 0.9 % IV SOLN
INTRAVENOUS | Status: AC
  Administered 2013-03-26: 50 mL/h via INTRAVENOUS

## 2013-03-26 MED ORDER — HYPROMELLOSE (GONIOSCOPIC) 2.5 % OP SOLN: 1.0000 [drp] | Freq: Four times a day (QID) | OPHTHALMIC | Status: DC | PRN

## 2013-03-26 MED ORDER — ATORVASTATIN CALCIUM 20 MG PO TABS
20.0000 mg | ORAL_TABLET | Freq: Every day | ORAL | Status: DC
  Administered 2013-03-26: 20 mg via ORAL
  Filled 2013-03-26 (×2): qty 1

## 2013-03-26 MED ORDER — PANTOPRAZOLE SODIUM 40 MG PO TBEC
40.0000 mg | DELAYED_RELEASE_TABLET | Freq: Every day | ORAL | Status: DC
  Administered 2013-03-26 – 2013-03-27 (×2): 40 mg via ORAL
  Filled 2013-03-26 (×2): qty 1

## 2013-03-26 MED ORDER — ATORVASTATIN CALCIUM 20 MG PO TABS
20.0000 mg | ORAL_TABLET | ORAL | Status: DC
Start: 2013-03-26 — End: 2013-03-26

## 2013-03-26 MED ORDER — ADULT MULTIVITAMIN W/MINERALS CH
1.0000 | ORAL_TABLET | Freq: Every day | ORAL | Status: DC
  Administered 2013-03-26 – 2013-03-27 (×2): 1 via ORAL
  Filled 2013-03-26 (×2): qty 1

## 2013-03-26 MED ORDER — SODIUM CHLORIDE 0.9 % IV SOLN
Freq: Once | INTRAVENOUS | Status: AC
  Administered 2013-03-26: 20 mL/h via INTRAVENOUS

## 2013-03-26 MED ORDER — ONE-DAILY MULTI VITAMINS PO TABS: 1.0000 | ORAL_TABLET | Freq: Every day | ORAL | Status: DC

## 2013-03-26 MED ORDER — LORATADINE 10 MG PO TABS
10.0000 mg | ORAL_TABLET | Freq: Every day | ORAL | Status: DC
  Administered 2013-03-26 – 2013-03-27 (×2): 10 mg via ORAL
  Filled 2013-03-26 (×2): qty 1

## 2013-03-26 MED ORDER — ALPRAZOLAM 0.5 MG PO TABS
1.0000 mg | ORAL_TABLET | Freq: Three times a day (TID) | ORAL | Status: DC | PRN
  Administered 2013-03-26 – 2013-03-27 (×3): 1 mg via ORAL
  Filled 2013-03-26 (×3): qty 2

## 2013-03-26 MED ORDER — LISINOPRIL 5 MG PO TABS
5.0000 mg | ORAL_TABLET | Freq: Two times a day (BID) | ORAL | Status: DC
  Administered 2013-03-26 – 2013-03-27 (×2): 5 mg via ORAL
  Filled 2013-03-26 (×4): qty 1

## 2013-03-26 MED ORDER — DOXEPIN HCL 25 MG PO CAPS
25.0000 mg | ORAL_CAPSULE | Freq: Every day | ORAL | Status: DC
  Administered 2013-03-27: 25 mg via ORAL
  Filled 2013-03-26: qty 1

## 2013-03-26 MED ORDER — ACETAMINOPHEN 325 MG PO TABS
650.0000 mg | ORAL_TABLET | ORAL | Status: DC | PRN
  Administered 2013-03-27: 650 mg via ORAL
  Filled 2013-03-26: qty 2

## 2013-03-26 MED ORDER — CARVEDILOL 6.25 MG PO TABS
6.2500 mg | ORAL_TABLET | Freq: Two times a day (BID) | ORAL | Status: DC
  Administered 2013-03-26 – 2013-03-27 (×2): 6.25 mg via ORAL
  Filled 2013-03-26 (×4): qty 1

## 2013-03-26 MED ORDER — MORPHINE SULFATE 4 MG/ML IJ SOLN
4.0000 mg | Freq: Once | INTRAMUSCULAR | Status: AC
  Administered 2013-03-26: 4 mg via INTRAVENOUS
  Filled 2013-03-26: qty 1

## 2013-03-26 MED ORDER — POLYETHYLENE GLYCOL 3350 17 G PO PACK
13.0000 g | PACK | Freq: Every day | ORAL | Status: DC
  Administered 2013-03-26 – 2013-03-27 (×2): 13 g via ORAL
  Filled 2013-03-26 (×2): qty 1

## 2013-03-26 MED ORDER — SENNA-DOCUSATE SODIUM 8.6-50 MG PO TABS: 1.0000 | ORAL_TABLET | Freq: Every day | ORAL | Status: DC

## 2013-03-26 MED ORDER — DOXEPIN HCL 25 MG PO CAPS: 25.0000 mg | ORAL_CAPSULE | Freq: Two times a day (BID) | ORAL | Status: DC

## 2013-03-26 MED ORDER — ONDANSETRON HCL 4 MG/2ML IJ SOLN
4.0000 mg | Freq: Once | INTRAMUSCULAR | Status: AC
  Administered 2013-03-26: 4 mg via INTRAVENOUS
  Filled 2013-03-26: qty 2

## 2013-03-26 MED ORDER — NITROGLYCERIN 0.4 MG SL SUBL
SUBLINGUAL_TABLET | SUBLINGUAL | Status: AC
  Filled 2013-03-26: qty 25

## 2013-03-26 MED ORDER — DOXEPIN HCL 50 MG PO CAPS
50.0000 mg | ORAL_CAPSULE | Freq: Every day | ORAL | Status: DC
  Administered 2013-03-26: 50 mg via ORAL
  Filled 2013-03-26 (×2): qty 1

## 2013-03-26 MED ORDER — ASPIRIN EC 81 MG PO TBEC
81.0000 mg | DELAYED_RELEASE_TABLET | Freq: Every day | ORAL | Status: DC
  Administered 2013-03-27: 81 mg via ORAL
  Filled 2013-03-26: qty 1

## 2013-03-26 MED ORDER — NITROGLYCERIN 0.4 MG SL SUBL
0.4000 mg | SUBLINGUAL_TABLET | SUBLINGUAL | Status: DC | PRN
  Administered 2013-03-26: 0.4 mg via SUBLINGUAL

## 2013-03-26 MED ORDER — ONDANSETRON HCL 4 MG/2ML IJ SOLN: 4.0000 mg | Freq: Four times a day (QID) | INTRAMUSCULAR | Status: DC | PRN

## 2013-03-26 MED ORDER — SENNOSIDES-DOCUSATE SODIUM 8.6-50 MG PO TABS
1.0000 | ORAL_TABLET | Freq: Every day | ORAL | Status: DC
  Administered 2013-03-26 – 2013-03-27 (×2): 1 via ORAL
  Filled 2013-03-26 (×2): qty 1

## 2013-03-26 MED ORDER — NITROGLYCERIN IN D5W 200-5 MCG/ML-% IV SOLN
5.0000 ug/min | INTRAVENOUS | Status: DC
  Administered 2013-03-26: 5 ug/min via INTRAVENOUS
  Filled 2013-03-26: qty 250

## 2013-03-26 MED ORDER — POLYVINYL ALCOHOL 1.4 % OP SOLN
1.0000 [drp] | Freq: Four times a day (QID) | OPHTHALMIC | Status: DC | PRN
  Filled 2013-03-26: qty 15

## 2013-03-26 MED ORDER — ACETAMINOPHEN 325 MG PO TABS: 650.0000 mg | ORAL_TABLET | Freq: Two times a day (BID) | ORAL | Status: DC | PRN

## 2013-03-26 NOTE — ED Provider Notes (Signed)
CSN: [id number]     Arrival date & time 03/26/13  4098 History   First MD Initiated Contact with Patient 03/26/13 0441     Chief Complaint  Patient presents with  . Chest Pain   (Consider location/radiation/quality/duration/timing/severity/associated sxs/prior Treatment) HPI History provided by patient.  History of coronary artery disease followed by Dr. Patty Sermons, has a pacemaker, chronic A. fib and on Coumadin. Patient also has history of hiatal hernia. He developed chest pain last night not relieved by Maalox. He took baby aspirin and tried nitroglycerin without relief. He went to sleep and was unable to sleep due to pain and called EMS. He received more nitroglycerin in route without relief of symptoms. He has some associated shortness of breath. Pain radiates to his neck. No back pain. No nausea or diaphoresis. No fevers or chills. No leg pain or leg swelling. Past Medical History  Diagnosis Date  . Ischemic cardiomyopathy     EF 40% by heart cath 01/2011   . Hypertension   . Hyperlipidemia   . GERD (gastroesophageal reflux disease)   . CAD (coronary artery disease)     s/p CABGx4 in 1991, with redo surgery in 2007 with  SVG to an OM and sequential SVG to the PDA and PLV, recent cath 01/2011 with patent grafts  . Atrial flutter October 2012    S/P ablation - not successful  . CHB (complete heart block) October 2012    S/P PTVP following ablation  . Kidney cysts     CYST ON BOTH KIDNEYS  . Hiatal hernia   . Other "heavy-for-dates" infants elevated PSA level  . A-fib   . Anginal pain   . Elevated PSA, less than 10 ng/ml 10/2011    "went from 5 to 9"  . Pacemaker   . Fibromyalgia   . Skin cancer     'above left eyebrow; tip of my nose" (08/29/2012)  . Shortness of breath     "recently" (08/29/2012)  . Depression     "since 1986-1987" (08/29/2012)  . Anxiety     "since 1191-4782" (08/29/2012)  . Panic attacks     "2 since 03/2012" (08/29/2012)  . Chronic systolic CHF  (congestive heart failure), NYHA class 2   . Chronic anticoagulation     Coumadin   Past Surgical History  Procedure Laterality Date  . Coronary artery bypass graft  1991    CABG X 5  . Coronary artery bypass graft  06/2005    CABG X 3  . Cardiac catheterization  10/30/2009    Grafts patent. Mild to moderate LV dysfunction. EF 45%. Managed medically.   . Cardiac catheterization      multiple  . Tonsillectomy  1960  . Cholecystectomy  2007  . Insert / replace / remove pacemaker  01/2011    initial placement  . Inguinal hernia repair  1960's    "left I think"  . Cataract extraction w/ intraocular lens  implant, bilateral  2012  . Cardioversion  05/25/2012    Procedure: CARDIOVERSION;  Surgeon: Cassell Clement, MD;  Location: [facility] ENDOSCOPY;  Service: Cardiovascular;  Laterality: N/A;  . Humerus surgery Left 1960    "growth on the bone taken off; not cancer" (08/29/2012)  . Excisional hemorrhoidectomy  ~ 1980  . Skin cancer excision      'above left eyebrow; tip of my nose" (08/29/2012)  . Atrial flutter ablation  01/2011    "didn't work" (08/29/2012)   Family History  Problem Relation Age of  Onset  . Stroke Mother   . Heart attack Father    History  Substance Use Topics  . Smoking status: Former Smoker -- 0.50 packs/day for 10 years    Types: Cigarettes    Quit date: 09/21/1973  . Smokeless tobacco: Never Used  . Alcohol Use: No    Review of Systems  Constitutional: Negative for fever and chills.  Eyes: Negative for visual disturbance.  Respiratory: Positive for shortness of breath.   Cardiovascular: Positive for chest pain.  Gastrointestinal: Negative for abdominal pain.  Genitourinary: Negative for dysuria.  Musculoskeletal: Negative for back pain, neck pain and neck stiffness.  Skin: Negative for rash.  Neurological: Negative for headaches.  All other systems reviewed and are negative.    Allergies  Sulfa drugs cross reactors; Ultram; and Xarelto  Home  Medications   Current Outpatient Rx  Name  Route  Sig  Dispense  Refill  . acetaminophen (TYLENOL) 325 MG tablet   Oral   Take 650 mg by mouth 2 (two) times daily as needed. pain         . ALPRAZolam (XANAX) 1 MG tablet   Oral   Take 1 tablet (1 mg total) by mouth 3 (three) times daily as needed. For anxiety   270 tablet   1   . atorvastatin (LIPITOR) 40 MG tablet   Oral   Take 40 mg by mouth as directed. 1/2 TABLET DAILY         . carvedilol (COREG) 12.5 MG tablet   Oral   Take 6.25 mg by mouth 2 (two) times daily with a meal.         . doxepin (SINEQUAN) 25 MG capsule   Oral   Take 25-50 mg by mouth 2 (two) times daily. 1 capsule every morning and 2 capsules at night         . gabapentin (NEURONTIN) 100 MG capsule   Oral   Take 1 capsule (100 mg total) by mouth 4 (four) times daily as needed.   120 capsule   5   . hydroxypropyl methylcellulose (ISOPTO TEARS) 2.5 % ophthalmic solution   Both Eyes   Place 1 drop into both eyes 4 (four) times daily as needed. Dry eyes         . isosorbide mononitrate (IMDUR) 60 MG 24 hr tablet   Oral   Take 1 tablet (60 mg total) by mouth daily.   90 tablet   3   . lisinopril (PRINIVIL,ZESTRIL) 5 MG tablet   Oral   Take 1 tablet (5 mg total) by mouth 2 (two) times daily.   180 tablet   30   . loratadine (CLARITIN) 10 MG tablet   Oral   Take 10 mg by mouth daily.         . Multiple Vitamin (MULTIVITAMIN) tablet   Oral   Take 1 tablet by mouth daily.           . nitroGLYCERIN (NITROSTAT) 0.4 MG SL tablet   Sublingual   Place 0.4 mg under the tongue every 5 (five) minutes as needed. For chest pain         . omeprazole (PRILOSEC) 20 MG capsule   Oral   Take 1 capsule (20 mg total) by mouth 2 (two) times daily.   180 capsule   3   . polyethylene glycol (MIRALAX / GLYCOLAX) packet   Oral   Take 13 g by mouth daily.         [name] Kitchen  sennosides-docusate sodium (SENOKOT-S) 8.6-50 MG tablet   Oral   Take 1  tablet by mouth daily.         [name] Kitchen warfarin (COUMADIN) 5 MG tablet   Oral   Take 5 mg by mouth at bedtime.           BP 101/63  Pulse 59  Temp(Src) 98.1 F (36.7 C) (Oral)  Resp 9  SpO2 100% Physical Exam  Constitutional: He is oriented to person, place, and time. He appears well-developed and well-nourished.  HENT:  Head: Normocephalic and atraumatic.  Eyes: EOM are normal. Pupils are equal, round, and reactive to light.  Neck: Neck supple.  Cardiovascular: Normal rate and intact distal pulses.   Pulmonary/Chest: Effort normal and breath sounds normal. No respiratory distress. He exhibits no tenderness.  Abdominal: Soft. He exhibits no distension. There is no tenderness.  Musculoskeletal: Normal range of motion. He exhibits no edema and no tenderness.  Neurological: He is alert and oriented to person, place, and time.  Skin: Skin is warm and dry.    ED Course  Procedures (including critical care time) Labs Review Labs Reviewed  PROTIME-INR - Abnormal; Notable for the following:    Prothrombin Time 23.4 (*)    INR 2.16 (*)    All other components within normal limits  COMPREHENSIVE METABOLIC PANEL - Abnormal; Notable for the following:    Sodium 129 (*)    Chloride 92 (*)    GFR calc non Af Amer 55 (*)    GFR calc Af Amer 63 (*)    All other components within normal limits  PRO B NATRIURETIC PEPTIDE - Abnormal; Notable for the following:    Pro B Natriuretic peptide (BNP) 805.6 (*)    All other components within normal limits  POCT I-STAT, CHEM 8 - Abnormal; Notable for the following:    Sodium 130 (*)    Chloride 93 (*)    Creatinine, Ser 1.40 (*)    All other components within normal limits  CBC WITH DIFFERENTIAL  POCT I-STAT TROPONIN I   Imaging Review Dg Chest Portable 1 View  03/26/2013   CLINICAL DATA:  Shortness of breath.  Chest pain.  EXAM: PORTABLE CHEST - 1 VIEW  COMPARISON:  Chest radiograph August 29, 2012  FINDINGS: The cardiac silhouette remains  at least mildly enlarged, status post median sternotomy for apparent Coronary artery bypass grafting. Mildly calcified aortic knob, mediastinal silhouette is nonsuspicious. Dual lead left cardiac pacemaker in situ.  Lungs are clear, no pleural effusions or focal consolidations. Pulmonary vasculature is unremarkable. No pneumothorax. Multiple EKG lines overlie the patient and may obscure subtle underlying pathology. Soft tissue planes and included osseous structures are nonsuspicious.  IMPRESSION: Stable cardiomegaly, no acute pulmonary process.   Electronically Signed   By: Awilda Metro   On: 03/26/2013 05:19    EKG Interpretation    Date/Time:  Tuesday March 26 2013 04:40:29 EST Ventricular Rate:  60 PR Interval:  44 QRS Duration: 189 QT Interval:  483 QTC Calculation: 483 R Axis:   -83 Text Interpretation:  Ventricular-paced rhythm No further analysis attempted due to paced rhythm No significant change since last tracing Confirmed by Haileigh Pitz  MD, Meshilem Machuca ([id number]) on 03/26/2013 4:51:45 AM           ASA, NTG PTA.  IV morphine provided - still having moderate pain. IV nitroglycerin drip.  6:40 AM d/w CAR on call, will evaluate PT in ED MDM   1. Chest pain  EKG, labs, chest x-ray Medications provided including IV narcotics IV nitroglycerin drip Cardiology consult    Sunnie Nielsen, MD 03/26/13 [phone]

## 2013-03-26 NOTE — ED Notes (Signed)
Admitting MD at bedside.

## 2013-03-26 NOTE — Progress Notes (Signed)
Patient arrived to unit by stretcher. Checked in. CCMT and eICU notified. Will continue to monitor patient. Bed alarm on for safety.

## 2013-03-26 NOTE — H&P (Signed)
Patient ID: [name] MRN: [id number], DOB/AGE: Nov 16, 1940   Admit date: 03/26/2013  Primary Physician: Redmond Baseman, MD Primary Cardiologist: Wylene Simmer, MD   Pt. Profile:  72 year old male with prior history of coronary artery disease status post coronary artery bypass grafting who was just seen in clinic yesterday and subsequently developed chest pain overnight prompting his presentation to the ED.  Problem List  Past Medical History  Diagnosis Date  . Ischemic cardiomyopathy     a. 06/2012 EF 25% by echo.  . Hypertension   . Hyperlipidemia   . GERD (gastroesophageal reflux disease)   . CAD (coronary artery disease)     a. 1991 s/p CABGx4;  b. redo CABG in 2007 (SVG->OM, VG->PDA->PLV);  c. 01/2011 Cath: LM 80ost, LAD 100p, LCX 100ost, OM1 100, RCA 100, LIMA->LAD ok, VG->PDA ok, VG->OM ok;  d. 03/2012 MV: EF 32% inf & inflat scar w/o ischemia.  . Atrial flutter     a. 01/2011 s/p unsuccessful RFCA complicated by CHB req PPM.  . CHB (complete heart block)     a. 01/2011: in setting of RFCA, s/p SJM 2110 [employer] DC PPM, ser # [id number].  [name] Kitchen Kidney cysts     a. bilateral  . Hiatal hernia     a. 03/01/2013 EGD and esoph dil.  [name] Kitchen BPH (benign prostatic hyperplasia)     a. elevated PSA  . A-fib     a. chronic coumadin  . Elevated PSA, less than 10 ng/ml   . Fibromyalgia   . Skin cancer     'above left eyebrow; tip of my nose" (08/29/2012)  . Depression     "since 1986-1987" (08/29/2012)  . Anxiety   . Panic attacks   . Chronic systolic CHF (congestive heart failure), NYHA class 2     a. 06/2012 Echo: EF 25%, distal sept and apical AK, sev dil LV, mild LVH, mod dil LA.    Past Surgical History  Procedure Laterality Date  . Coronary artery bypass graft  1991    CABG X 5  . Coronary artery bypass graft  06/2005    CABG X 3  . Cardiac catheterization  10/30/2009    Grafts patent. Mild to moderate LV dysfunction. EF 45%. Managed medically.   . Cardiac  catheterization      multiple  . Tonsillectomy  1960  . Cholecystectomy  2007  . Insert / replace / remove pacemaker  01/2011    initial placement  . Inguinal hernia repair  1960's    "left I think"  . Cataract extraction w/ intraocular lens  implant, bilateral  2012  . Cardioversion  05/25/2012    Procedure: CARDIOVERSION;  Surgeon: Cassell Clement, MD;  Location: [facility] ENDOSCOPY;  Service: Cardiovascular;  Laterality: N/A;  . Humerus surgery Left 1960    "growth on the bone taken off; not cancer" (08/29/2012)  . Excisional hemorrhoidectomy  ~ 1980  . Skin cancer excision      'above left eyebrow; tip of my nose" (08/29/2012)  . Atrial flutter ablation  01/2011    "didn't work" (08/29/2012)     Allergies  Allergies  Allergen Reactions  . Sulfa Drugs Cross Reactors Other (See Comments)    unknown  . Ultram [Tramadol Hcl] Itching    "don't remember how bad"  . Xarelto [Rivaroxaban] Other (See Comments)    Itching     HPI  72 year old male with the above complex problem list. He is status post coronary artery bypass grafting  in 1991 with subsequent redo bypass in 2007. His last catheterization was performed in 2012, revealing severe native three-vessel coronary artery disease with a patent LIMA to the LAD, patent vein graft to the obtuse marginal, and a patent sequential vein graft to the PDA and PLV. He had recurrent chest pain in November of 2013 and had a nonischemic Myoview. He also has a history of chronic atrial fibrillation as well as complete heart block in the setting of radiofrequency catheter ablation for atrial flutter in September of 2010, for which permanent pacemaker was placed. He is on chronic Coumadin related to his atrial fibrillation.  Patient was evaluated by gastroenterology in October secondary to difficulty swallowing and choking sensation. He says that he underwent EGD on October 31, showing small hiatal hernia. Apparently he underwent esophageal dilatation with  resolution of choking sensation. Since then, he has had occasional episodes of epigastric discomfort which he has successfully treated with Mylanta on a when necessary basis. He was seen in cardiology clinic by both Dr. Graciela Husbands and Dr. Patty Sermons yesterday and was doing well. Unfortunately however, at approximately 9 PM last night, he developed substernal chest pressure without associated symptoms which lasted about an hour and a half and resolved after taking aspirin and sublingual nitroglycerin. He says he finally fell off to sleep at about 11:30 last night and then awoke at 2 AM with recurrent substernal chest pressure associated with dyspnea and mild nausea. Again, he took aspirin nitroglycerin however he did not have immediate relief and called EMS. He was taken to the  where his ECG shows a paced rhythm and initial troponin is normal. He continues to report 4/10 chest pressure but is otherwise stable. He denies PND, orthopnea, dizziness, syncope, edema, or early satiety. There is no change in chest discomfort with deep breathing, palpation, or position changes.  Home Medications  Prior to Admission medications   Medication Sig Start Date End Date Taking? Authorizing Provider  acetaminophen (TYLENOL) 325 MG tablet Take 650 mg by mouth 2 (two) times daily as needed. pain   Yes Historical Provider, MD  ALPRAZolam (XANAX) 1 MG tablet Take 1 tablet (1 mg total) by mouth 3 (three) times daily as needed. For anxiety 03/25/13  Yes Cassell Clement, MD  atorvastatin (LIPITOR) 40 MG tablet Take 40 mg by mouth as directed. 1/2 TABLET DAILY 05/30/12  Yes Cassell Clement, MD  carvedilol (COREG) 12.5 MG tablet Take 6.25 mg by mouth 2 (two) times daily with a meal.   Yes Historical Provider, MD  doxepin (SINEQUAN) 25 MG capsule Take 25-50 mg by mouth 2 (two) times daily. 1 capsule every morning and 2 capsules at night   Yes Historical Provider, MD  gabapentin (NEURONTIN) 100 MG capsule Take 1 capsule (100 mg  total) by mouth 4 (four) times daily as needed. 01/23/13  Yes Cassell Clement, MD  hydroxypropyl methylcellulose (ISOPTO TEARS) 2.5 % ophthalmic solution Place 1 drop into both eyes 4 (four) times daily as needed. Dry eyes   Yes Historical Provider, MD  isosorbide mononitrate (IMDUR) 60 MG 24 hr tablet Take 1 tablet (60 mg total) by mouth daily. 12/27/11  Yes Cassell Clement, MD  lisinopril (PRINIVIL,ZESTRIL) 5 MG tablet Take 1 tablet (5 mg total) by mouth 2 (two) times daily. 11/22/12  Yes Cassell Clement, MD  loratadine (CLARITIN) 10 MG tablet Take 10 mg by mouth daily.   Yes Historical Provider, MD  Multiple Vitamin (MULTIVITAMIN) tablet Take 1 tablet by mouth daily.  Yes Historical Provider, MD  nitroGLYCERIN (NITROSTAT) 0.4 MG SL tablet Place 0.4 mg under the tongue every 5 (five) minutes as needed. For chest pain 04/29/11  Yes Cassell Clement, MD  omeprazole (PRILOSEC) 20 MG capsule Take 1 capsule (20 mg total) by mouth 2 (two) times daily. 03/25/13  Yes Cassell Clement, MD  polyethylene glycol (MIRALAX / GLYCOLAX) packet Take 13 g by mouth daily.   Yes Historical Provider, MD  sennosides-docusate sodium (SENOKOT-S) 8.6-50 MG tablet Take 1 tablet by mouth daily.   Yes Historical Provider, MD  warfarin (COUMADIN) 5 MG tablet Take 5 mg by mouth at bedtime.    Yes Historical Provider, MD    Family History  Family History  Problem Relation Age of Onset  . Stroke Mother   . Heart attack Father     died @ 82, mult MI's.    Social History  History   Social History  . Marital Status: Widowed    Spouse Name: N/A    Number of Children: 2  . Years of Education: 12   Occupational History  . Not on file.   Social History Main Topics  . Smoking status: Former Smoker -- 0.50 packs/day for 10 years    Types: Cigarettes    Quit date: 09/21/1973  . Smokeless tobacco: Never Used  . Alcohol Use: No  . Drug Use: No  . Sexual Activity: No   Other Topics Concern  . Not on file    Social History Narrative   Wife passed away in Aug 22, 2011. Lives in [place] with his daughter and her family.  Retired from [employer].     Review of Systems General:  No chills, fever, night sweats or weight changes.  Cardiovascular:  +++ chest pain w associated dyspnea and nausea, no edema, orthopnea, palpitations, paroxysmal nocturnal dyspnea. Dermatological: No rash, lesions/masses Respiratory: No cough, +++ dyspnea in setting of c/p this AM. Urologic: No hematuria, dysuria Abdominal:   +++ nausea in setting of c/p this AM.  Recent h/o dysphagia s/p esoph dil.  Chronic constipation.  No vomiting, diarrhea, bright red blood per rectum, melena, or hematemesis Neurologic:  No visual changes, wkns, changes in mental status. All other systems reviewed and are otherwise negative except as noted above.  Physical Exam  Blood pressure 127/81, pulse 59, temperature 98.1 F (36.7 C), temperature source Oral, resp. rate 17, height 5\' 11"  (1.803 m), weight 191 lb (86.637 kg), SpO2 100.00%.  General: Pleasant, NAD Psych: Normal affect. Neuro: Alert and oriented X 3. Moves all extremities spontaneously. HEENT: Normal  Neck: Supple without bruits or JVD. Lungs:  Resp regular and unlabored, few basilar crackles, otw CTA. Heart: RRR no s3, s4, or murmurs. Abdomen: Soft, non-tender, non-distended, BS + x 4.  Extremities: No clubbing, cyanosis or edema. DP/PT/Radials 1+ and equal bilaterally.  Labs  Troponin [facility] Test)  Recent Labs  03/26/13 0515  TROPIPOC 0.00   Lab Results  Component Value Date   WBC 6.3 03/26/2013   HGB 15.3 03/26/2013   HCT 45.0 03/26/2013   MCV 90.9 03/26/2013   PLT 181 03/26/2013     Recent Labs Lab 03/26/13 0451 03/26/13 0517  NA 129* 130*  K 4.8 4.7  CL 92* 93*  CO2 30  --   BUN 19 21  CREATININE 1.27 1.40*  CALCIUM 8.9  --   PROT 6.6  --   BILITOT 0.9  --   ALKPHOS 71  --   ALT 19  --  AST 22  --   GLUCOSE 91 90   Lab  Results  Component Value Date   INR 2.16* 03/26/2013   INR 2.2 02/14/2013   INR 2.3 01/08/2013    Lab Results  Component Value Date   CHOL 97 01/23/2013   HDL 34.60* 01/23/2013   LDLCALC 48 01/23/2013   TRIG 70.0 01/23/2013   Radiology/Studies  Dg Chest Portable 1 View  03/26/2013   CLINICAL DATA:  Shortness of breath.  Chest pain.  EXAM: PORTABLE CHEST - 1 VIEW  IMPRESSION: Stable cardiomegaly, no acute pulmonary process.   Electronically Signed   By: Awilda Metro   On: 03/26/2013 05:19   ECG  V paced, 60, underlying afib  ASSESSMENT AND PLAN  1.  USA/CAD:  Patient presented this morning with substernal chest pressure associated with dyspnea and nausea, not responsive to nitrates. ECG shows paced rhythm while initial troponin is normal. He continues to report 4/10 chest pressure though otherwise appears stable. He has significant history of coronary artery disease status post CABG along with redo CABG. His last catheterization in 2012 showed patent LIMA to the LAD, vein graft to the obtuse marginal, and sequential vein graft to the PDA and PLV. He had a nonischemic Myoview in October of 2013. We will plan to observe and cycle cardiac markers. IV nitroglycerin has been started here in the ED. His INR is currently therapeutic and therefore we'll not initiate heparin at the time being. If he rules in, we will plan to hold Coumadin pursue diagnostic catheterization. If however he gets pain relief and rules out, will likely plan noninvasive evaluation.  Continue statin and beta blocker. Add back aspirin for now. This was just discontinued yesterday. Hold home dose of nitrate while on IV nitroglycerin.  2. Hypertension: Stable.  3. Hyperlipidemia: LDL 48 in September. Continue statin therapy. LFTs within normal limits.  4. Chronic atrial fibrillation with history of atrial flutter: Continue beta blocker therapy. We will hold Coumadin until we see enzymes trend and determine clinical course.   Add heparin if he rules in.  5. Ischemic cardiomyopathy/chronic systolic congestive heart failure: He is euvolemic on exam. Continue beta blocker and ACE inhibitor therapy.   6. Hyponatremia: He is euvolemic on exam and is not on diuretics at home.  Gently hydrate.  7.  Renal Insufficiency:  Creat 1.4 on repeat testing this AM.  Gently hydrate.  Signed, Nicolasa Ducking, NP 03/26/2013, 7:37 AM   Attending Note:   The patient was seen and examined.  Agree with assessment and plan as noted above.  Changes made to the above note as needed.  Pt's symptoms are more c/w a GI origin than cardiac but given his hx of CAD, I cannot completely rule out ischemia.  He has had pain for the past 6 hours straight so if this is cardiac, we should expect to see a Troponin increase.    Will keep for observation , draw serial enzymes.   I have talked with Dr. Patty Sermons.  Mr. Podesta has frequent episodes of CP.  Will anticipate DC tomorrow if he rules out.  We can consider myoview in the future if needed.    Vesta Mixer, Montez Hageman., MD, [facility] 03/26/2013, 8:27 AM

## 2013-03-26 NOTE — ED Notes (Signed)
Report called to [address]

## 2013-03-26 NOTE — Progress Notes (Signed)
Pt. States he has living will at home. States he is a DNR.   Physician notified: Dr. Melburn Popper At: 1457  Regarding: Pt. Wants DNR, current code status: Full Awaiting return response.   Returned Response at: 1520  Order(s): Keep patient as is. Full code.

## 2013-03-26 NOTE — ED Notes (Signed)
Pt was just seen at MD office yesterday and had pacemaker checked.  Pt had chest pain last nite at 9pm and took Maalox and then woke up about one hour ago.  EMS gave 2 nitro and patient is on coumadin for afib

## 2013-03-26 NOTE — ED Notes (Signed)
Report received, assumed care.  

## 2013-03-27 ENCOUNTER — Encounter (HOSPITAL_COMMUNITY): Payer: Medicare Other | Attending: Cardiology

## 2013-03-27 ENCOUNTER — Encounter (HOSPITAL_COMMUNITY): Payer: Self-pay | Admitting: Nurse Practitioner

## 2013-03-27 ENCOUNTER — Observation Stay (HOSPITAL_COMMUNITY): Payer: Medicare Other

## 2013-03-27 DIAGNOSIS — R0789 Other chest pain: Secondary | ICD-10-CM

## 2013-03-27 DIAGNOSIS — R079 Chest pain, unspecified: Secondary | ICD-10-CM

## 2013-03-27 LAB — BASIC METABOLIC PANEL
BUN: 18 mg/dL (ref 6–23)
CO2: 28 mEq/L (ref 19–32)
Calcium: 8.7 mg/dL (ref 8.4–10.5)
Chloride: 98 mEq/L (ref 96–112)
Creatinine, Ser: 1.21 mg/dL (ref 0.50–1.35)
GFR calc Af Amer: 67 mL/min — ABNORMAL LOW (ref >90)
Glucose, Bld: 89 mg/dL (ref 70–99)
Potassium: 4.8 mEq/L (ref 3.5–5.1)
Sodium: 133 mEq/L — ABNORMAL LOW (ref 135–145)

## 2013-03-27 LAB — PROTIME-INR
INR: 2.33 — ABNORMAL HIGH (ref 0.00–1.49)
Prothrombin Time: 24.8 seconds — ABNORMAL HIGH (ref 11.6–15.2)

## 2013-03-27 MED ORDER — REGADENOSON 0.4 MG/5ML IV SOLN
0.4000 mg | Freq: Once | INTRAVENOUS | Status: AC
  Administered 2013-03-27: 0.4 mg via INTRAVENOUS
  Filled 2013-03-27: qty 5

## 2013-03-27 MED ORDER — TECHNETIUM TC 99M SESTAMIBI GENERIC - CARDIOLITE
30.0000 | Freq: Once | INTRAVENOUS | Status: AC | PRN
  Administered 2013-03-27: 30 via INTRAVENOUS

## 2013-03-27 MED ORDER — REGADENOSON 0.4 MG/5ML IV SOLN
INTRAVENOUS | Status: AC
  Filled 2013-03-27: qty 5

## 2013-03-27 MED ORDER — TECHNETIUM TC 99M SESTAMIBI GENERIC - CARDIOLITE
10.0000 | Freq: Once | INTRAVENOUS | Status: AC | PRN
  Administered 2013-03-27: 10 via INTRAVENOUS

## 2013-03-27 NOTE — Progress Notes (Signed)
Patient Name: [name] Date of Encounter: 03/27/2013     Active Problems:   * No active hospital problems. *    SUBJECTIVE  Still has chest soreness. Still concerned about his heart. Enzymes negative.  CURRENT MEDS . aspirin EC  81 mg Oral Daily  . atorvastatin  20 mg Oral q1800  . carvedilol  6.25 mg Oral BID WC  . doxepin  25 mg Oral Daily  . doxepin  50 mg Oral QHS  . lisinopril  5 mg Oral BID  . loratadine  10 mg Oral Daily  . multivitamin with minerals  1 tablet Oral Daily  . pantoprazole  40 mg Oral Daily  . polyethylene glycol  13 g Oral Daily  . senna-docusate  1 tablet Oral Daily    OBJECTIVE  Filed Vitals:   03/26/13 2149 03/26/13 2300 03/27/13 0400 03/27/13 0500  BP: 116/69 104/76 105/69   Pulse:  61 66   Temp:  98.1 F (36.7 C) 97.5 F (36.4 C)   TempSrc:  Oral Oral   Resp:  12 14   Height:      Weight:    188 lb 15 oz (85.7 kg)  SpO2:  99% 99%     Intake/Output Summary (Last 24 hours) at 03/27/13 0723 Last data filed at 03/27/13 0720  Gross per 24 hour  Intake    614 ml  Output   2800 ml  Net  -2186 ml   Filed Weights   03/26/13 0735 03/26/13 1300 03/27/13 0500  Weight: 191 lb (86.637 kg) 190 lb 0.6 oz (86.2 kg) 188 lb 15 oz (85.7 kg)    PHYSICAL EXAM  General: Pleasant, NAD. Neuro: Alert and oriented X 3. Moves all extremities spontaneously. Psych: Normal affect. HEENT:  Normal  Neck: Supple without bruits or JVD. Lungs:  Resp regular and unlabored, CTA. Heart: RRR no s3, s4, or murmurs. Abdomen: Soft, non-tender, non-distended, BS + x 4.  Extremities: No clubbing, cyanosis or edema. DP/PT/Radials 2+ and equal bilaterally.  Accessory Clinical Findings  CBC  Recent Labs  03/26/13 0451 03/26/13 0517  WBC 6.3  --   NEUTROABS 3.6  --   HGB 14.5 15.3  HCT 42.2 45.0  MCV 90.9  --   PLT 181  --    Basic Metabolic Panel  Recent Labs  03/26/13 0451 03/26/13 0517 03/27/13 0152  NA 129* 130* 133*  K 4.8 4.7 4.8    CL 92* 93* 98  CO2 30  --  28  GLUCOSE 91 90 89  BUN 19 21 18   CREATININE 1.27 1.40* 1.21  CALCIUM 8.9  --  8.7   Liver Function Tests  Recent Labs  03/26/13 0451  AST 22  ALT 19  ALKPHOS 71  BILITOT 0.9  PROT 6.6  ALBUMIN 3.8   No results found for this basename: LIPASE, AMYLASE,  in the last 72 hours Cardiac Enzymes  Recent Labs  03/26/13 1450 03/26/13 2000 03/27/13 0152  TROPONINI <0.30 <0.30 <0.30   BNP No components found with this basename: POCBNP,  D-Dimer No results found for this basename: DDIMER,  in the last 72 hours Hemoglobin A1C No results found for this basename: HGBA1C,  in the last 72 hours Fasting Lipid Panel No results found for this basename: CHOL, HDL, LDLCALC, TRIG, CHOLHDL, LDLDIRECT,  in the last 72 hours Thyroid Function Tests No results found for this basename: TSH, T4TOTAL, FREET3, T3FREE, THYROIDAB,  in the last 72 hours  TELE  Atrial fib, Vpacing  ECG  Atrial fibrillation, V-pacing.  Radiology/Studies  Dg Chest Portable 1 View  03/26/2013   CLINICAL DATA:  Shortness of breath.  Chest pain.  EXAM: PORTABLE CHEST - 1 VIEW  COMPARISON:  Chest radiograph August 29, 2012  FINDINGS: The cardiac silhouette remains at least mildly enlarged, status post median sternotomy for apparent Coronary artery bypass grafting. Mildly calcified aortic knob, mediastinal silhouette is nonsuspicious. Dual lead left cardiac pacemaker in situ.  Lungs are clear, no pleural effusions or focal consolidations. Pulmonary vasculature is unremarkable. No pneumothorax. Multiple EKG lines overlie the patient and may obscure subtle underlying pathology. Soft tissue planes and included osseous structures are nonsuspicious.  IMPRESSION: Stable cardiomegaly, no acute pulmonary process.   Electronically Signed   By: Awilda Metro   On: 03/26/2013 05:19    ASSESSMENT AND PLAN 1. [place]/CAD: Patient presented this morning with substernal chest pressure associated with  dyspnea and nausea, not responsive to nitrates. ECG shows paced rhythm while initial troponin is normal. He continues to report 4/10 chest pressure though otherwise appears stable. He has significant history of coronary artery disease status post CABG along with redo CABG. His last catheterization in 2012 showed patent LIMA to the LAD, vein graft to the obtuse marginal, and sequential vein graft to the PDA and PLV. He had a nonischemic Myoview in October of 2013. We will plan to observe and cycle cardiac markers. IV nitroglycerin has been started here in the ED. His INR is currently therapeutic and therefore we'll not initiate heparin at the time being. If he rules in, we will plan to hold Coumadin pursue diagnostic catheterization. If however he gets pain relief and rules out, will likely plan noninvasive evaluation. Continue statin and beta blocker. Add back aspirin for now. This was just discontinued yesterday. Hold home dose of nitrate while on IV nitroglycerin.  2. Hypertension: Stable.  3. Hyperlipidemia: LDL 48 in September. Continue statin therapy. LFTs within normal limits.  4. Chronic atrial fibrillation with history of atrial flutter: Continue beta blocker therapy. We will hold Coumadin until we see enzymes trend and determine clinical course. Add heparin if he rules in.  5. Ischemic cardiomyopathy/chronic systolic congestive heart failure: He is euvolemic on exam. Continue beta blocker and ACE inhibitor therapy.  6. Hyponatremia: He is euvolemic on exam and is not on diuretics at home. Gently hydrate.  7. Renal Insufficiency: Creat 1.4 on repeat testing this AM. Gently hydrate.  Plan: Still chest discomfort this am.  He is very anxious. Will proceed with lexiscan. Can discharge later today if negative.  Signed, Cassell Clement NP

## 2013-03-27 NOTE — Discharge Summary (Signed)
Discharge Summary   Patient ID: [name],  MRN: [id number], DOB/AGE: 1941/01/10 72 y.o.  Admit date: 03/26/2013 Discharge date: 03/27/2013  Primary Care Provider: Leodis Sias PATRICK Primary Cardiologist: Wylene Simmer, MD   Discharge Diagnoses Principal Problem:   Midsternal chest pain  **s/p negative myoview this admission.  Active Problems:   Ischemic cardiomyopathy   Hypertension   GERD (gastroesophageal reflux disease)   Fibromyalgia   Hx of CABG   CAD (coronary artery disease)   Chronic systolic CHF (congestive heart failure), NYHA class 2   Hyperlipidemia   Atrial fibrillation-Permanent   Pacemaker-stJudes   Chronic anticoagulation  Allergies Allergies  Allergen Reactions  . Sulfa Drugs Cross Reactors Other (See Comments)    unknown  . Ultram [Tramadol Hcl] Itching    "don't remember how bad"  . Xarelto [Rivaroxaban] Other (See Comments)    Itching    Procedures  Lexiscan Cardiolite 11.26.2014  IMPRESSION: Large fixed defect involving the inferior wall, apex, and adjacent walls. No stress-induced ischemia.  Ejection fraction: 33% _____________   History of Present Illness  72 y/o male with prior h/o CAD s/p prior CABG and redo CABG in 2007.  He also has a h/o permanent atrial fibrillation on chronic coumadin therapy and is s/p PPM placement following development of complete heart block during attempted atrial flutter ablation in September 2010.  He recently underwent EGD secondary to dysphagia and was found to have a hiatal hernia. He reports also having esophageal dilatation at that time.  Following EGD, he noted occasional epigastric discomfort, which resolved with mylanta.  On 11/24, he was seen in cardiology clinic and was doing well.  Unfortunately, that evening, he developed substernal chest discomfort, which resolved about 1.5 hrs after taking an ASA and ntg.  He fell off to sleep and then was awakened at approximately 2 AM with recurrent  substernal chest pressure associated with mild dyspnea and nausea.  After symptoms persisted despite repeat ASA and ntg, he called EMS and was taken to the [facility] ED.  There, ECG was non-acute and initial troponin was normal.  He continued to report 4/10 chest pain and was admitted for further evaluation.  Hospital Course  Patient ruled out for MI.  He continued to report mild chest discomfort.  In the absence of objective evidence of ischemia, IV NTG was discontinued and arrangements were made for nuclear stress testing.  On 11/26, he underwent lexiscan cardiolite, which showed a large fixed defect involving the inferior wall, apex, and adjacent walls, without evidence of stress-induced ischemia.  He will be discharged home this afternoon in good condition.  Discharge Vitals Blood pressure 134/78, pulse 60, temperature 97.9 F (36.6 C), temperature source Oral, resp. rate 12, height 5\' 11"  (1.803 m), weight 188 lb 15 oz (85.7 kg), SpO2 100.00%.  Filed Weights   03/26/13 0735 03/26/13 1300 03/27/13 0500  Weight: 191 lb (86.637 kg) 190 lb 0.6 oz (86.2 kg) 188 lb 15 oz (85.7 kg)   Labs  CBC  Recent Labs  03/26/13 0451 03/26/13 0517  WBC 6.3  --   NEUTROABS 3.6  --   HGB 14.5 15.3  HCT 42.2 45.0  MCV 90.9  --   PLT 181  --    Basic Metabolic Panel  Recent Labs  03/26/13 0451 03/26/13 0517 03/27/13 0152  NA 129* 130* 133*  K 4.8 4.7 4.8  CL 92* 93* 98  CO2 30  --  28  GLUCOSE 91 90 89  BUN 19 21 18  CREATININE 1.27 1.40* 1.21  CALCIUM 8.9  --  8.7   Liver Function Tests  Recent Labs  03/26/13 0451  AST 22  ALT 19  ALKPHOS 71  BILITOT 0.9  PROT 6.6  ALBUMIN 3.8   Cardiac Enzymes  Recent Labs  03/26/13 1450 03/26/13 2000 03/27/13 0152  TROPONINI <0.30 <0.30 <0.30    Lab Results  Component Value Date   INR 2.33* 03/27/2013   INR 2.16* 03/26/2013   Disposition  Pt is being discharged home today in good condition.  Follow-up Plans &  Appointments  Follow-up Information   Follow up with Redmond Baseman, MD On 04/18/2013. (3:00 PM)    Specialty:  Family Medicine   Contact information:   [address] [place] [address] [phone]       Follow up with Cassell Clement, MD On 04/16/2013. (3:15 PM)    Specialty:  Cardiology   Contact information:   [address] [place] [address] [phone]      Discharge Medications    Medication List    STOP taking these medications       gabapentin 100 MG capsule  Commonly known as:  NEURONTIN      TAKE these medications       acetaminophen 325 MG tablet  Commonly known as:  TYLENOL  Take 650 mg by mouth 2 (two) times daily as needed. pain     ALPRAZolam 1 MG tablet  Commonly known as:  XANAX  Take 1 tablet (1 mg total) by mouth 3 (three) times daily as needed. For anxiety     atorvastatin 40 MG tablet  Commonly known as:  LIPITOR  Take 40 mg by mouth as directed. 1/2 TABLET DAILY     carvedilol 12.5 MG tablet  Commonly known as:  COREG  Take 6.25 mg by mouth 2 (two) times daily with a meal.     doxepin 25 MG capsule  Commonly known as:  SINEQUAN  Take 25-50 mg by mouth 2 (two) times daily. 1 capsule every morning and 2 capsules at night     hydroxypropyl methylcellulose 2.5 % ophthalmic solution  Commonly known as:  ISOPTO TEARS  Place 1 drop into both eyes 4 (four) times daily as needed. Dry eyes     isosorbide mononitrate 60 MG 24 hr tablet  Commonly known as:  IMDUR  Take 1 tablet (60 mg total) by mouth daily.     lisinopril 5 MG tablet  Commonly known as:  PRINIVIL,ZESTRIL  Take 1 tablet (5 mg total) by mouth 2 (two) times daily.     loratadine 10 MG tablet  Commonly known as:  CLARITIN  Take 10 mg by mouth daily.     multivitamin tablet  Take 1 tablet by mouth daily.     nitroGLYCERIN 0.4 MG SL tablet  Commonly known as:  NITROSTAT  Place 0.4 mg under the tongue every 5 (five) minutes as needed. For  chest pain     omeprazole 20 MG capsule  Commonly known as:  PRILOSEC  Take 1 capsule (20 mg total) by mouth 2 (two) times daily.     polyethylene glycol packet  Commonly known as:  MIRALAX / GLYCOLAX  Take 13 g by mouth daily.     sennosides-docusate sodium 8.6-50 MG tablet  Commonly known as:  SENOKOT-S  Take 1 tablet by mouth daily.     warfarin 5 MG tablet  Commonly known as:  COUMADIN  Take 5 mg  by mouth at bedtime.       Outstanding Labs/Studies  None  Duration of Discharge Encounter   Greater than 30 minutes including physician time.  Signed, Nicolasa Ducking NP 03/27/2013, 3:47 PM

## 2013-03-27 NOTE — Progress Notes (Signed)
Pt discharged to home via w/c with belongings with son. Discharge instructions discussed and explained to patient. F/u appt given to pt. Exit care notes given on angina, anticoagulation and hiatal hernia.

## 2013-03-29 ENCOUNTER — Ambulatory Visit (INDEPENDENT_AMBULATORY_CARE_PROVIDER_SITE_OTHER): Payer: Medicare Other | Admitting: Family Medicine

## 2013-03-29 VITALS — BP 123/73 | HR 80 | Temp 97.0°F | Ht 71.0 in | Wt 199.0 lb

## 2013-03-29 DIAGNOSIS — I4891 Unspecified atrial fibrillation: Secondary | ICD-10-CM

## 2013-03-29 LAB — POCT INR: INR: 1.6

## 2013-03-29 NOTE — Patient Instructions (Signed)
Anticoagulation Dose Instructions as of 03/29/2013     Glynis Smiles Tue Wed Thu Fri Sat   New Dose 5 mg 5 mg 5 mg 5 mg 5 mg 5 mg 5 mg    Description       Take 2 po tonight and then continue one po qd and follow up in 2 weeks For repeat inr

## 2013-03-29 NOTE — Progress Notes (Signed)
   Subjective:    Patient ID: [name], male    DOB: 13-May-1940, 72 y.o.   MRN: [id number]  HPI  This 72 y.o. male presents for evaluation of check PTINR. He was seen in the ED for chest pain and had PTINR drawn Which was normal.  He has been taking coumadin 5mg  po qd for  Atrial fibrillation and ischemic cardiomyopathy.  He has missed A dose of coumadin.  He had negative CE's and cardiac w/u by ED and dishcharged home..  Review of Systems No chest pain, SOB, HA, dizziness, vision change, N/V, diarrhea, constipation, dysuria, urinary urgency or frequency, myalgias, arthralgias or rash.     Objective:   Physical Exam  Vital signs noted  Well developed well nourished male.  HEENT - Head atraumatic Normocephalic                Eyes - PERRLA, Conjuctiva - clear Sclera- Clear EOMI                Ears - EAC's Wnl TM's Wnl Gross Hearing WNL                Throat - oropharanx wnl Respiratory - Lungs CTA bilateral Cardiac - irregularly irregular S1 and S2 without murmur GI - Abdomen soft Nontender and bowel sounds active x 4 Extremities - No edema. Neuro - Grossly intact.      Assessment & Plan:  Atrial fibrillation - Plan: POCT INR Instructed patient to take 2 coumadin tonight and then back to one a day and then Follow up in 2 weeks for repeat ptinr.  Deatra Canter FNP

## 2013-04-02 ENCOUNTER — Telehealth: Payer: Self-pay | Admitting: Pharmacist Clinician (PhC)/ Clinical Pharmacy Specialist

## 2013-04-03 ENCOUNTER — Other Ambulatory Visit: Payer: Medicare Other

## 2013-04-03 ENCOUNTER — Ambulatory Visit (INDEPENDENT_AMBULATORY_CARE_PROVIDER_SITE_OTHER): Payer: Medicare Other | Admitting: Pharmacist

## 2013-04-03 DIAGNOSIS — I4891 Unspecified atrial fibrillation: Secondary | ICD-10-CM

## 2013-04-03 LAB — POCT INR: INR: 2.2

## 2013-04-03 NOTE — Patient Instructions (Signed)
Anticoagulation Dose Instructions as of 04/03/2013     [name] Tue Wed Thu Fri Sat   New Dose 5 mg 5 mg 5 mg 5 mg 5 mg 5 mg 5 mg    Description       Take 1 tablet daily.        INR was 2.2 today

## 2013-04-12 ENCOUNTER — Ambulatory Visit (INDEPENDENT_AMBULATORY_CARE_PROVIDER_SITE_OTHER): Payer: Medicare Other | Admitting: Cardiology

## 2013-04-12 ENCOUNTER — Encounter: Payer: Self-pay | Admitting: Cardiology

## 2013-04-12 ENCOUNTER — Ambulatory Visit: Payer: Medicare Other | Admitting: Cardiology

## 2013-04-12 ENCOUNTER — Telehealth: Payer: Self-pay | Admitting: Cardiology

## 2013-04-12 VITALS — BP 130/82 | HR 61 | Ht 71.0 in

## 2013-04-12 DIAGNOSIS — I259 Chronic ischemic heart disease, unspecified: Secondary | ICD-10-CM

## 2013-04-12 DIAGNOSIS — E785 Hyperlipidemia, unspecified: Secondary | ICD-10-CM

## 2013-04-12 DIAGNOSIS — I119 Hypertensive heart disease without heart failure: Secondary | ICD-10-CM

## 2013-04-12 DIAGNOSIS — I10 Essential (primary) hypertension: Secondary | ICD-10-CM

## 2013-04-12 DIAGNOSIS — R079 Chest pain, unspecified: Secondary | ICD-10-CM | POA: Insufficient documentation

## 2013-04-12 DIAGNOSIS — I4891 Unspecified atrial fibrillation: Secondary | ICD-10-CM

## 2013-04-12 DIAGNOSIS — I2510 Atherosclerotic heart disease of native coronary artery without angina pectoris: Secondary | ICD-10-CM

## 2013-04-12 NOTE — Assessment & Plan Note (Signed)
The patient is in permanent atrial fibrillation.  He is on Coumadin.  Is not having any TIA or stroke symptoms.  His Coumadin is checked at [facility] family practice.

## 2013-04-12 NOTE — Assessment & Plan Note (Signed)
Blood pressures remaining stable on current therapy. 

## 2013-04-12 NOTE — Telephone Encounter (Signed)
Returned call to patient he stated he has been having a dull chest pain off and on since Wednesday 04/10/13.Stated he had chest pain again this morning.Stated he has a sore spot right side of chest appox size of a 50 cent piece very sore but has a dull ache.Stated he has taken NTG x 2 with no relief. Stated he took maalox last night with relief.Stated he has appointment on Tuesday 04/16/13 with Dr.Brackbill but would like to be seen today.

## 2013-04-12 NOTE — Assessment & Plan Note (Signed)
The patient has a history of hyperlipidemia.  We will plan to update his labs at his next office visit in 2 months.

## 2013-04-12 NOTE — Telephone Encounter (Signed)
Spoke to patient appointment scheduled with Dr.Brackbill today at 2:30 pm.

## 2013-04-12 NOTE — Patient Instructions (Signed)
INCREASE YOUR TYLENOL 500 MG TO THREE TIMES A DAY AS NEEDED FOR DISCOMFORT  RESTART YOU GABAPENTIN TO 100 MG FOUR TIMES A DAY  Your physician recommends that you schedule a follow-up appointment in: 2 months with fasting labs (LP/BMET/HFP)

## 2013-04-12 NOTE — Assessment & Plan Note (Signed)
The patient continues to have intermittent chest discomfort.  His most recent nuclear stress test showed an old inferior wall scar but no ischemia and this was done on 03/27/13.  We reviewed those pictures together today.  Examination today reveals chest wall tenderness to palpation along the right side of the sternum.

## 2013-04-12 NOTE — Telephone Encounter (Signed)
New message  Patient is having dull chest pain. It started on Wednesday, he's not sure if it could be his stomach. He doesn't want to go the weekend and not know what to do. Please call and advise.

## 2013-04-12 NOTE — Progress Notes (Signed)
[name] Date of Birth:  1940/08/06 [name] [place], [place]  [address] [phone]         Fax   [phone]  History of Present Illness: This pleasant 72 year old gentleman is seen for a work in office visit. He was  admitted to [facility] on 03/09/12 and discharged on 03/11/12.  He was admitted after he complained of having some chest discomfort at the time of a routine pacemaker check up. He was found to be in atrial fibrillation at the time of admission. On the date of discharge 11//13 the patient underwent a Lexus scan Myoview stress test which revealed left ventricular ejection fraction 32% with septal and inferior wall motion abnormalities and with an inferior lateral scar but no evidence of reversible ischemia. His prior ejection fraction at the time of catheter in 2012 was approximately 40%. 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 His last cardiac catheterization in October 2012 showed that his grafts were patent. The patient also has a history of hypercholesterolemia and essential hypertension. He has had a prior history of paroxysmal atrial flutter with ablation therapy and subsequent heart block which required a [employer] pacemaker which was implanted in 2012. The patient is on long-term Coumadin. He has a lot of anxiety. On 05/25/12 the patient underwent elective cardioversion which was successful. Normal sinus rhythm and was returned as was confirmed by intracardiac electrogram via his pacemaker. However, within 24 hours after the cardioversion he felt that his pulse was irregular again. EKG confirmed that he was back in atrial flutter fibrillation. He was essentially asymptomatic and states that the arrhythmia doesn't really bother him much.  Strategy is rate control and long-term anticoagulation.  He is intolerant of Xarelto because of itching and is back on long-term Coumadin.  The patient was readmitted to the hospital in  November 2014 for chest pain.  He underwent another nuclear stress test on 03/27/13.  This showed an ejection fraction of 33% and showed an inferior wall scar but no reversible ischemia.  There was inferior wall dyskinesis.  Since his last hospitalization he has been temporarily off his gabapentin which she was taking for neuropathic pain.  He has had some intermittent chest pain for the past 2 days to the right of the sternum and he has an area of tenderness to palpation which reproduces his pain. Current Outpatient Prescriptions  Medication Sig Dispense Refill  . acetaminophen (TYLENOL) 500 MG tablet Take 500 mg by mouth every 8 (eight) hours as needed.      . ALPRAZolam (XANAX) 1 MG tablet Take 1 tablet (1 mg total) by mouth 3 (three) times daily as needed. For anxiety  270 tablet  1  . atorvastatin (LIPITOR) 40 MG tablet Take 40 mg by mouth as directed.       . carvedilol (COREG) 12.5 MG tablet Take 6.25 mg by mouth 2 (two) times daily with a meal.      . doxepin (SINEQUAN) 25 MG capsule Take 25-50 mg by mouth 2 (two) times daily. 1 capsule every morning and 2 capsules at night      . gabapentin (NEURONTIN) 100 MG capsule Take 100 mg by mouth 4 (four) times daily.      . hydroxypropyl methylcellulose (ISOPTO TEARS) 2.5 % ophthalmic solution Place 1 drop into both eyes 4 (four) times daily as needed. Dry eyes      . isosorbide mononitrate (IMDUR)  60 MG 24 hr tablet Take 1 tablet (60 mg total) by mouth daily.  90 tablet  3  . lisinopril (PRINIVIL,ZESTRIL) 5 MG tablet Take 1 tablet (5 mg total) by mouth 2 (two) times daily.  180 tablet  30  . loratadine (CLARITIN) 10 MG tablet Take 10 mg by mouth daily.      . Multiple Vitamin (MULTIVITAMIN) tablet Take 1 tablet by mouth daily.        . nitroGLYCERIN (NITROSTAT) 0.4 MG SL tablet Place 0.4 mg under the tongue every 5 (five) minutes as needed. For chest pain      . omeprazole (PRILOSEC) 20 MG capsule Take 1 capsule (20 mg total) by mouth 2 (two) times  daily.  180 capsule  3  . polyethylene glycol (MIRALAX / GLYCOLAX) packet Take 13 g by mouth daily.      . sennosides-docusate sodium (SENOKOT-S) 8.6-50 MG tablet Take 1 tablet by mouth daily.      [name] Kitchen warfarin (COUMADIN) 5 MG tablet Take 5 mg by mouth at bedtime.        No current facility-administered medications for this visit.    Allergies  Allergen Reactions  . Sulfa Drugs Cross Reactors Other (See Comments)    unknown  . Ultram [Tramadol Hcl] Itching    "don't remember how bad"  . Xarelto [Rivaroxaban] Other (See Comments)    Itching     Patient Active Problem List   Diagnosis Date Noted  . Cough, persistent 11/15/2010    Priority: High  . Hx of CABG 09/23/2010    Priority: High  . Hypertension     Priority: High  . Anxiety     Priority: High  . Hyperlipidemia     Priority: Medium  . Chest pain 04/12/2013  . Midsternal chest pain 03/27/2013  . Ventral hernia 01/23/2013  . Angina pectoris 08/30/2012  . Chronic systolic CHF (congestive heart failure), NYHA class 2   . Chronic anticoagulation   . Atypical chest pain 03/11/2012  . Umbilical hernia 01/05/2012  . CAD (coronary artery disease) 10/20/2011  . Atrial fibrillation-Permanent 05/31/2011  . 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 Pacemaker-stJudes 05/31/2011  . Chest pain 04/11/2011  . Ischemic cardiomyopathy   . GERD (gastroesophageal reflux disease)   . Fibromyalgia     History  Smoking status  . Former Smoker -- 0.50 packs/day for 10 years  . Types: Cigarettes  . Quit date: 09/21/1973  Smokeless tobacco  . Never Used    History  Alcohol Use No    Family History  Problem Relation Age of Onset  . Stroke Mother   . Heart attack Father     died @ 37, mult MI's.    Review of Systems: Constitutional: no fever chills diaphoresis or fatigue or change in weight.  Head and neck: no hearing loss, no epistaxis, no photophobia or visual disturbance. Respiratory: No cough, shortness of breath  or wheezing. Cardiovascular: No chest pain peripheral edema, palpitations. Gastrointestinal: No abdominal distention, no abdominal pain, no change in bowel habits hematochezia or melena. Genitourinary: No dysuria, no frequency, no urgency, no nocturia. Musculoskeletal:No arthralgias, no back pain, no gait disturbance or myalgias. Neurological: No dizziness, no headaches, no numbness, no seizures, no syncope, no weakness, no tremors. Hematologic: No lymphadenopathy, no easy bruising. Psychiatric: No confusion, no hallucinations, no sleep disturbance.    Physical Exam: Filed Vitals:   04/12/13 1308  BP: 130/82  Pulse: 61   the general appearance reveals a well-developed well-nourished talkative gentleman  in no distress.The head and neck exam reveals pupils equal and reactive.  Extraocular movements are full.  There is no scleral icterus.  The mouth and pharynx are normal.  The neck is supple.  The carotids reveal no bruits.  The jugular venous pressure is normal.  The  thyroid is not enlarged.  There is no lymphadenopathy.  The chest is clear to percussion and auscultation.  There are no rales or rhonchi.  Expansion of the chest is symmetrical.  The precordium is quiet.  The first heart sound is normal.  The second heart sound is physiologically split.  There is no murmur gallop rub or click.  There is no abnormal lift or heave.  The abdomen is soft and nontender.  The bowel sounds are normal.  The liver and spleen are not enlarged.  There is a lemon-sized ventral hernia present which is not tender.  There are no abdominal bruits.  Extremities reveal good pedal pulses.  There is no phlebitis or edema.  There is no cyanosis or clubbing.  Strength is normal and symmetrical in all extremities.  There is no lateralizing weakness.  There are no sensory deficits.  The skin is warm and dry.  There is no rash.  EKG today shows underlying atrial fibrillation and paced ventricular rhythm, unchanged from prior  tracing.  Assessment / Plan:  Continue present medication except stop aspirin and continue long-term Coumadin for his permanent atrial fibrillation.   Recheck in 2 months for followup office visit lipid panel hepatic function panel and basal metabolic panel.  We are restarting his gabapentin 100 mg 4 times a day.  He may take up to 3 Tylenol Extra Strength tablets in a given day if needed for chest pain.

## 2013-04-16 ENCOUNTER — Encounter: Payer: Medicare Other | Admitting: Cardiology

## 2013-04-18 ENCOUNTER — Ambulatory Visit (INDEPENDENT_AMBULATORY_CARE_PROVIDER_SITE_OTHER): Payer: Medicare Other | Admitting: Family Medicine

## 2013-04-18 ENCOUNTER — Encounter: Payer: Self-pay | Admitting: Family Medicine

## 2013-04-18 VITALS — BP 130/82 | HR 59 | Temp 97.0°F | Ht 71.0 in | Wt 194.5 lb

## 2013-04-18 DIAGNOSIS — I4891 Unspecified atrial fibrillation: Secondary | ICD-10-CM

## 2013-04-18 DIAGNOSIS — I255 Ischemic cardiomyopathy: Secondary | ICD-10-CM

## 2013-04-18 DIAGNOSIS — F411 Generalized anxiety disorder: Secondary | ICD-10-CM

## 2013-04-18 DIAGNOSIS — I2589 Other forms of chronic ischemic heart disease: Secondary | ICD-10-CM

## 2013-04-18 DIAGNOSIS — K219 Gastro-esophageal reflux disease without esophagitis: Secondary | ICD-10-CM

## 2013-04-18 DIAGNOSIS — F419 Anxiety disorder, unspecified: Secondary | ICD-10-CM

## 2013-04-18 DIAGNOSIS — Z7901 Long term (current) use of anticoagulants: Secondary | ICD-10-CM

## 2013-04-18 DIAGNOSIS — I2510 Atherosclerotic heart disease of native coronary artery without angina pectoris: Secondary | ICD-10-CM

## 2013-04-18 DIAGNOSIS — Z950 Presence of cardiac pacemaker: Secondary | ICD-10-CM

## 2013-04-18 DIAGNOSIS — K429 Umbilical hernia without obstruction or gangrene: Secondary | ICD-10-CM

## 2013-04-18 NOTE — Progress Notes (Signed)
Patient ID: [name], male   DOB: 1940/06/13, 72 y.o.   MRN: [id number] SUBJECTIVE: CC: Chief Complaint  Patient presents with  . Follow-up    3 [facility]RON[facility]    HPI: Patient is here for follow up of hyperlipidemia/HTN/CAD/Atrial Fibrillation/GERD: denies Headache;denies Chest Pain;denies weakness;denies Shortness of Breath and orthopnea;denies Visual changes;denies palpitations;denies cough;denies pedal edema;denies symptoms of TIA or stroke;deniesClaudication symptoms. admits to Compliance with medications; denies Problems with medications.    Past Medical History  Diagnosis Date  . Ischemic cardiomyopathy     a. 06/2012 EF 25% by echo.  . Hypertension   . Hyperlipidemia   . GERD (gastroesophageal reflux disease)   . CAD (coronary artery disease)     a. 1991 s/p CABGx4;  b. redo CABG in 2007 (SVG->OM, VG->PDA->PLV);  c. 01/2011 Cath: LM 80ost, LAD 100p, LCX 100ost, OM1 100, RCA 100, LIMA->LAD ok, VG->PDA ok, VG->OM ok;  d. 03/2012 MV: EF 32% inf & inflat scar w/o ischemia; e. Lexi MV: EF 33%, inf infarct, no ischemia.  . Atrial flutter     a. 01/2011 s/p unsuccessful RFCA complicated by CHB req PPM.  . CHB (complete heart block)     a. 01/2011: in setting of RFCA, s/p SJM 2110 [employer] DC PPM, ser # [id number].  [name] Kitchen Kidney cysts     a. bilateral  . Hiatal hernia     a. 03/01/2013 EGD and esoph dil.  [name] Kitchen BPH (benign prostatic hyperplasia)     a. elevated PSA  . A-fib     a. chronic coumadin  . Elevated PSA, less than 10 ng/ml   . Fibromyalgia   . Skin cancer     'above left eyebrow; tip of my nose" (08/29/2012)  . Depression     "since 1986-1987" (08/29/2012)  . Anxiety   . Panic attacks   . Chronic systolic CHF (congestive heart failure), NYHA class 2     a. 06/2012 Echo: EF 25%, distal sept and apical AK, sev dil LV, mild LVH, mod dil LA.   Past Surgical History  Procedure Laterality Date  . Coronary artery bypass graft  1991    CABG X 5  . Coronary artery bypass  graft  06/2005    CABG X 3  . Cardiac catheterization  10/30/2009    Grafts patent. Mild to moderate LV dysfunction. EF 45%. Managed medically.   . Cardiac catheterization      multiple  . Tonsillectomy  1960  . Cholecystectomy  2007  . Insert / replace / remove pacemaker  01/2011    initial placement  . Inguinal hernia repair  1960's    "left I think"  . Cataract extraction w/ intraocular lens  implant, bilateral  2012  . Cardioversion  05/25/2012    Procedure: CARDIOVERSION;  Surgeon: Cassell Clement, MD;  Location: [facility] ENDOSCOPY;  Service: Cardiovascular;  Laterality: N/A;  . Humerus surgery Left 1960    "growth on the bone taken off; not cancer" (08/29/2012)  . Excisional hemorrhoidectomy  ~ 1980  . Skin cancer excision      'above left eyebrow; tip of my nose" (08/29/2012)  . Atrial flutter ablation  01/2011    "didn't work" (08/29/2012)   History   Social History  . Marital Status: Widowed    Spouse Name: N/A    Number of Children: 2  . Years of Education: 12   Occupational History  . Not on file.   Social History Main Topics  . Smoking status:  Former Smoker -- 0.50 packs/day for 10 years    Types: Cigarettes    Quit date: 09/21/1973  . Smokeless tobacco: Never Used  . Alcohol Use: No  . Drug Use: No  . Sexual Activity: No   Other Topics Concern  . Not on file   Social History Narrative   Wife passed away in 08-19-2011. Lives in [place] with his daughter and her family.  Retired from [employer].   Family History  Problem Relation Age of Onset  . Stroke Mother   . Heart attack Father     died @ 36, mult MI's.   Current Outpatient Prescriptions on File Prior to Visit  Medication Sig Dispense Refill  . acetaminophen (TYLENOL) 500 MG tablet Take 500 mg by mouth every 8 (eight) hours as needed.      . ALPRAZolam (XANAX) 1 MG tablet Take 1 tablet (1 mg total) by mouth 3 (three) times daily as needed. For anxiety  270 tablet  1  . atorvastatin (LIPITOR) 40 MG  tablet Take 40 mg by mouth as directed.       . carvedilol (COREG) 12.5 MG tablet Take 6.25 mg by mouth 2 (two) times daily with a meal.      . doxepin (SINEQUAN) 25 MG capsule Take 25-50 mg by mouth 2 (two) times daily. 1 capsule every morning and 2 capsules at night      . gabapentin (NEURONTIN) 100 MG capsule Take 100 mg by mouth 4 (four) times daily.      . hydroxypropyl methylcellulose (ISOPTO TEARS) 2.5 % ophthalmic solution Place 1 drop into both eyes 4 (four) times daily as needed. Dry eyes      . isosorbide mononitrate (IMDUR) 60 MG 24 hr tablet Take 1 tablet (60 mg total) by mouth daily.  90 tablet  3  . lisinopril (PRINIVIL,ZESTRIL) 5 MG tablet Take 1 tablet (5 mg total) by mouth 2 (two) times daily.  180 tablet  30  . loratadine (CLARITIN) 10 MG tablet Take 10 mg by mouth daily.      . Multiple Vitamin (MULTIVITAMIN) tablet Take 1 tablet by mouth daily.        . nitroGLYCERIN (NITROSTAT) 0.4 MG SL tablet Place 0.4 mg under the tongue every 5 (five) minutes as needed. For chest pain      . omeprazole (PRILOSEC) 20 MG capsule Take 1 capsule (20 mg total) by mouth 2 (two) times daily.  180 capsule  3  . polyethylene glycol (MIRALAX / GLYCOLAX) packet Take 13 g by mouth daily.      . sennosides-docusate sodium (SENOKOT-S) 8.6-50 MG tablet Take 1 tablet by mouth daily.      [name] Kitchen warfarin (COUMADIN) 5 MG tablet Take 5 mg by mouth at bedtime.        No current facility-administered medications on file prior to visit.   Allergies  Allergen Reactions  . Sulfa Drugs Cross Reactors Other (See Comments)    unknown  . Ultram [Tramadol Hcl] Itching    "don't remember how bad"  . Xarelto [Rivaroxaban] Other (See Comments)    Itching    Immunization History  Administered Date(s) Administered  . Influenza,inj,Quad PF,36+ Mos 02/04/2013   Prior to Admission medications   Medication Sig Start Date End Date Taking? Authorizing Provider  acetaminophen (TYLENOL) 500 MG tablet Take 500 mg by mouth  every 8 (eight) hours as needed.    Historical Provider, MD  ALPRAZolam Prudy Feeler) 1 MG tablet Take 1 tablet (1 mg  total) by mouth 3 (three) times daily as needed. For anxiety 03/25/13   Cassell Clement, MD  atorvastatin (LIPITOR) 40 MG tablet Take 40 mg by mouth as directed.  05/30/12   Cassell Clement, MD  carvedilol (COREG) 12.5 MG tablet Take 6.25 mg by mouth 2 (two) times daily with a meal.    Historical Provider, MD  doxepin (SINEQUAN) 25 MG capsule Take 25-50 mg by mouth 2 (two) times daily. 1 capsule every morning and 2 capsules at night    Historical Provider, MD  gabapentin (NEURONTIN) 100 MG capsule Take 100 mg by mouth 4 (four) times daily.    Historical Provider, MD  hydroxypropyl methylcellulose (ISOPTO TEARS) 2.5 % ophthalmic solution Place 1 drop into both eyes 4 (four) times daily as needed. Dry eyes    Historical Provider, MD  isosorbide mononitrate (IMDUR) 60 MG 24 hr tablet Take 1 tablet (60 mg total) by mouth daily. 12/27/11   Cassell Clement, MD  lisinopril (PRINIVIL,ZESTRIL) 5 MG tablet Take 1 tablet (5 mg total) by mouth 2 (two) times daily. 11/22/12   Cassell Clement, MD  loratadine (CLARITIN) 10 MG tablet Take 10 mg by mouth daily.    Historical Provider, MD  Multiple Vitamin (MULTIVITAMIN) tablet Take 1 tablet by mouth daily.      Historical Provider, MD  nitroGLYCERIN (NITROSTAT) 0.4 MG SL tablet Place 0.4 mg under the tongue every 5 (five) minutes as needed. For chest pain 04/29/11   Cassell Clement, MD  omeprazole (PRILOSEC) 20 MG capsule Take 1 capsule (20 mg total) by mouth 2 (two) times daily. 03/25/13   Cassell Clement, MD  polyethylene glycol [facility] / GLYCOLAX) packet Take 13 g by mouth daily.    Historical Provider, MD  sennosides-docusate sodium (SENOKOT-S) 8.6-50 MG tablet Take 1 tablet by mouth daily.    Historical Provider, MD  warfarin (COUMADIN) 5 MG tablet Take 5 mg by mouth at bedtime.     Historical Provider, MD     ROS: As above in the HPI. All  other systems are stable or negative.  OBJECTIVE: APPEARANCE:  Patient in no acute distress.The patient appeared well nourished and normally developed. Acyanotic. Waist: VITAL SIGNS:BP 130/82  Pulse 59  Temp(Src) 97 F (36.1 C) (Oral)  Ht 5\' 11"  (1.803 m)  Wt 194 lb 8 oz (88.225 kg)  BMI 27.14 kg/m2 WM  SKIN: warm and  Dry without overt rashes, tattoos and scars  HEAD and Neck: without JVD, Head and scalp: normal Eyes:No scleral icterus. Fundi normal, eye movements normal. Ears: Auricle normal, canal normal, Tympanic membranes normal, insufflation normal. Nose: normal Throat: normal Neck & thyroid: normal  CHEST & LUNGS: Chest wall: normal Lungs: Clear  CVS: Reveals the PMI to be normally located. Regular rhythm, First and Second Heart sounds are normal,  2/6 systolic murmurs, no rubs or gallops. Peripheral vasculature: Radial pulses: normal Dorsal pedis pulses: normal Posterior pulses: normal  ABDOMEN:  Appearance: normal. Umbilical hernia reducible. Benign, no organomegaly, no masses, no Abdominal Aortic enlargement. No Guarding , no rebound. No Bruits. Bowel sounds: normal  RECTAL: N/A GU: N/A  EXTREMETIES: nonedematous.  MUSCULOSKELETAL:  Spine: normal Joints: intact  NEUROLOGIC: oriented to time,place and person; nonfocal. Strength is normal Sensory is normal Reflexes are normal Cranial Nerves are normal.  ASSESSMENT:  Atrial fibrillation - Plan: POCT INR  CAD (coronary artery disease)  Chronic anticoagulation  Ischemic cardiomyopathy  Pacemaker-stJudes  Anxiety  GERD (gastroesophageal reflux disease)  Umbilical hernia  PLAN: Results for orders placed  in visit on 04/18/13  POCT INR      Result Value Range   INR 2.8     No change in warfarin Other labs via cardiology. He sees Dr Patty Sermons on a regular basis. Medications reviewed.  No orders of the defined types were placed in this encounter.   Meds ordered this encounter   Medications  . fluticasone (FLONASE) 50 MCG/ACT nasal spray    Sig:    There are no discontinued medications. Return in about 4 weeks (around 05/16/2013) for recheck protime.  Anea Fodera P. Modesto Charon, M.D.

## 2013-04-22 ENCOUNTER — Telehealth: Payer: Self-pay | Admitting: Cardiology

## 2013-04-22 DIAGNOSIS — R609 Edema, unspecified: Secondary | ICD-10-CM

## 2013-04-22 MED ORDER — FUROSEMIDE 20 MG PO TABS: ORAL_TABLET | ORAL | Status: DC

## 2013-04-22 MED ORDER — GABAPENTIN 100 MG PO CAPS: 100.0000 mg | ORAL_CAPSULE | Freq: Four times a day (QID) | ORAL | Status: DC

## 2013-04-22 NOTE — Telephone Encounter (Signed)
Yes he should get off his feet and take a fluid pill. I don't see any diuretic on his drug list. He could take a 20 mg lasix.

## 2013-04-22 NOTE — Telephone Encounter (Signed)
Patients right leg is very swollen and feels tight. He says it feels funny to walk on it. Please call patient and advise.

## 2013-04-22 NOTE — Telephone Encounter (Signed)
Advised patient, verbalized understanding  

## 2013-04-22 NOTE — Telephone Encounter (Signed)
New Message  Pt called, Requests a call back no further details//SR

## 2013-04-22 NOTE — Telephone Encounter (Signed)
Patient recently went to a family gathering and stood about an hour or more talking to people. He states he probably consumed more salt than usual but wasn't sure if he should take a fluid pill.  Blood pressure 136/79. Will forward to  Dr. Patty Sermons for review

## 2013-05-07 ENCOUNTER — Encounter: Payer: Self-pay | Admitting: Family Medicine

## 2013-05-07 ENCOUNTER — Ambulatory Visit (INDEPENDENT_AMBULATORY_CARE_PROVIDER_SITE_OTHER): Payer: Medicare Other | Admitting: Family Medicine

## 2013-05-07 VITALS — BP 136/77 | HR 74 | Temp 97.4°F | Wt 194.2 lb

## 2013-05-07 DIAGNOSIS — R0989 Other specified symptoms and signs involving the circulatory and respiratory systems: Secondary | ICD-10-CM

## 2013-05-07 DIAGNOSIS — R05 Cough: Secondary | ICD-10-CM

## 2013-05-07 DIAGNOSIS — R059 Cough, unspecified: Secondary | ICD-10-CM

## 2013-05-07 DIAGNOSIS — J069 Acute upper respiratory infection, unspecified: Secondary | ICD-10-CM

## 2013-05-07 LAB — POCT INFLUENZA A/B
Influenza A, POC: NEGATIVE
Influenza B, POC: NEGATIVE

## 2013-05-07 MED ORDER — AZITHROMYCIN 250 MG PO TABS: ORAL_TABLET | ORAL | Status: DC

## 2013-05-07 NOTE — Patient Instructions (Signed)

## 2013-05-07 NOTE — Progress Notes (Signed)
   Subjective:    Patient ID: [name], male    DOB: 1940-06-30, 73 y.o.   MRN: [id number]  HPI This 73 y.o. male presents for evaluation of cough and uri sx's for over a week.   Review of Systems No chest pain, SOB, HA, dizziness, vision change, N/V, diarrhea, constipation, dysuria, urinary urgency or frequency, myalgias, arthralgias or rash.     Objective:   Physical Exam  Vital signs noted  Well developed well nourished male.  HEENT - Head atraumatic Normocephalic                Eyes - PERRLA, Conjuctiva - clear Sclera- Clear EOMI                Ears - EAC's Wnl TM's Wnl Gross Hearing WNL                Nose - Nares patent                 Throat - oropharanx wnl Respiratory - Lungs CTA bilateral Cardiac - RRR S1 and S2 without murmur GI - Abdomen soft Nontender and bowel sounds active x 4 Extremities - No edema. Neuro - Grossly intact.      Assessment & Plan:  Cough - Plan: POCT Influenza A/B, azithromycin (ZITHROMAX) 250 MG tablet  Chest congestion - Plan: POCT Influenza A/B, azithromycin (ZITHROMAX) 250 MG tablet  URI, acute - Plan: azithromycin (ZITHROMAX) 250 MG tablet  Push po fluids, rest, tylenol and motrin otc prn as directed for fever, arthralgias, and myalgias.  Follow up prn if sx's continue or persist.  Lysbeth Penner FNP

## 2013-05-14 ENCOUNTER — Ambulatory Visit (INDEPENDENT_AMBULATORY_CARE_PROVIDER_SITE_OTHER): Payer: Medicare Other | Admitting: Pharmacist Clinician (PhC)/ Clinical Pharmacy Specialist

## 2013-05-14 DIAGNOSIS — I4891 Unspecified atrial fibrillation: Secondary | ICD-10-CM

## 2013-05-14 LAB — POCT INR: INR: 2.4

## 2013-05-21 ENCOUNTER — Encounter: Payer: Self-pay | Admitting: Pharmacist Clinician (PhC)/ Clinical Pharmacy Specialist

## 2013-05-23 ENCOUNTER — Encounter: Payer: Self-pay | Admitting: Nurse Practitioner

## 2013-05-23 ENCOUNTER — Ambulatory Visit (INDEPENDENT_AMBULATORY_CARE_PROVIDER_SITE_OTHER): Payer: Medicare Other | Admitting: Nurse Practitioner

## 2013-05-23 VITALS — BP 110/62 | HR 62 | Temp 96.7°F | Ht 70.0 in | Wt 194.0 lb

## 2013-05-23 DIAGNOSIS — S335XXA Sprain of ligaments of lumbar spine, initial encounter: Secondary | ICD-10-CM

## 2013-05-23 DIAGNOSIS — S39012A Strain of muscle, fascia and tendon of lower back, initial encounter: Secondary | ICD-10-CM

## 2013-05-23 MED ORDER — CYCLOBENZAPRINE HCL 5 MG PO TABS: 5.0000 mg | ORAL_TABLET | Freq: Three times a day (TID) | ORAL | Status: DC | PRN

## 2013-05-23 MED ORDER — KETOROLAC TROMETHAMINE 60 MG/2ML IM SOLN
60.0000 mg | Freq: Once | INTRAMUSCULAR | Status: AC
  Administered 2013-05-23: 60 mg via INTRAMUSCULAR

## 2013-05-23 NOTE — Patient Instructions (Signed)
Lumbosacral Strain Lumbosacral strain is a strain of any of the parts that make up your lumbosacral vertebrae. Your lumbosacral vertebrae are the bones that make up the lower third of your backbone. Your lumbosacral vertebrae are held together by muscles and tough, fibrous tissue (ligaments).  CAUSES  A sudden blow to your back can cause lumbosacral strain. Also, anything that causes an excessive stretch of the muscles in the low back can cause this strain. This is typically seen when people exert themselves strenuously, fall, lift heavy objects, bend, or crouch repeatedly. RISK FACTORS  Physically demanding work.  Participation in pushing or pulling sports or sports that require sudden twist of the back (tennis, golf, baseball).  Weight lifting.  Excessive lower back curvature.  Forward-tilted pelvis.  Weak back or abdominal muscles or both.  Tight hamstrings. SIGNS AND SYMPTOMS  Lumbosacral strain may cause pain in the area of your injury or pain that moves (radiates) down your leg.  DIAGNOSIS Your health care provider can often diagnose lumbosacral strain through a physical exam. In some cases, you may need tests such as X-ray exams.  TREATMENT  Treatment for your lower back injury depends on many factors that your clinician will have to evaluate. However, most treatment will include the use of anti-inflammatory medicines. HOME CARE INSTRUCTIONS   Avoid hard physical activities (tennis, racquetball, waterskiing) if you are not in proper physical condition for it. This may aggravate or create problems.  If you have a back problem, avoid sports requiring sudden body movements. Swimming and walking are generally safer activities.  Maintain good posture.  Maintain a healthy weight.  For acute conditions, you may put ice on the injured area.  Put ice in a plastic bag.  Place a towel between your skin and the bag.  Leave the ice on for 20 minutes, 2 3 times a day.  When the  low back starts healing, stretching and strengthening exercises may be recommended. SEEK MEDICAL CARE IF:  Your back pain is getting worse.  You experience severe back pain not relieved with medicines. SEEK IMMEDIATE MEDICAL CARE IF:   You have numbness, tingling, weakness, or problems with the use of your arms or legs.  There is a change in bowel or bladder control.  You have increasing pain in any area of the body, including your belly (abdomen).  You notice shortness of breath, dizziness, or feel faint.  You feel sick to your stomach (nauseous), are throwing up (vomiting), or become sweaty.  You notice discoloration of your toes or legs, or your feet get very cold. MAKE SURE YOU:   Understand these instructions.  Will watch your condition.  Will get help right away if you are not doing well or get worse. Document Released: 01/26/2005 Document Revised: 02/06/2013 Document Reviewed: 12/05/2012 [employer] Patient Information 2014 [employer].  

## 2013-05-23 NOTE — Progress Notes (Signed)
   Subjective:    Patient ID: [name], male    DOB: 11-15-1940, 73 y.o.   MRN: [id number]  HPI Patient in c/o back pain- said he got up from chair Sunday and hi back "grabbed" and he has been having pain every since- rates pain 10/10- standing makes it worse- nothing seems to help it. Pain does not radiate down hip. Took some methocarbonol which has made him sick to his stomach.   Review of Systems  Constitutional: Negative.   HENT: Negative.   Respiratory: Negative.   Cardiovascular: Negative.   Gastrointestinal: Negative.   Musculoskeletal: Positive for back pain. Gait problem: walking with cane.  All other systems reviewed and are negative.       Objective:   Physical Exam  Constitutional: He appears well-developed and well-nourished.  Cardiovascular: Normal rate, regular rhythm and normal heart sounds.   Pulmonary/Chest: Effort normal and breath sounds normal.  Musculoskeletal:  Decrease ROM of lumbar spine due to pain with flexion and extension Positive SLR right at 45 degrees.  Neurological: He has normal reflexes.    BP 110/62  Pulse 62  Temp(Src) 96.7 F (35.9 C) (Oral)  Ht 5\' 10"  (1.778 m)  Wt 194 lb (87.998 kg)  BMI 27.84 kg/m2       Assessment & Plan:   1. Low back strain    Meds ordered this encounter  Medications  . furosemide (LASIX) 20 MG tablet    Sig:   . cyclobenzaprine (FLEXERIL) 5 MG tablet    Sig: Take 1 tablet (5 mg total) by mouth 3 (three) times daily as needed for muscle spasms.    Dispense:  30 tablet    Refill:  0    Order Specific Question:  Supervising Provider    Answer:  Chipper Herb [[id number]]  . ketorolac (TORADOL) injection 60 mg    Sig:    Moist heat No lifting Back strengthen exercises rto prn  Mary-Margaret Hassell Done, FNP

## 2013-05-31 ENCOUNTER — Encounter: Payer: Self-pay | Admitting: Family Medicine

## 2013-05-31 ENCOUNTER — Ambulatory Visit (INDEPENDENT_AMBULATORY_CARE_PROVIDER_SITE_OTHER): Payer: Medicare Other | Admitting: Family Medicine

## 2013-05-31 ENCOUNTER — Ambulatory Visit (INDEPENDENT_AMBULATORY_CARE_PROVIDER_SITE_OTHER): Payer: Medicare Other

## 2013-05-31 VITALS — BP 127/88 | HR 62 | Temp 97.6°F | Ht 70.0 in | Wt 194.0 lb

## 2013-05-31 DIAGNOSIS — M545 Low back pain, unspecified: Secondary | ICD-10-CM

## 2013-05-31 MED ORDER — HYDROCODONE-ACETAMINOPHEN 5-325 MG PO TABS: 1.0000 | ORAL_TABLET | Freq: Four times a day (QID) | ORAL | Status: DC | PRN

## 2013-05-31 NOTE — Patient Instructions (Signed)
Back Pain, Adult Low back pain is very common. About 1 in 5 people have back pain.The cause of low back pain is rarely dangerous. The pain often gets better over time.About half of people with a sudden onset of back pain feel better in just 2 weeks. About 8 in 10 people feel better by 6 weeks.  CAUSES Some common causes of back pain include:  Strain of the muscles or ligaments supporting the spine.  Wear and tear (degeneration) of the spinal discs.  Arthritis.  Direct injury to the back. DIAGNOSIS Most of the time, the direct cause of low back pain is not known.However, back pain can be treated effectively even when the exact cause of the pain is unknown.Answering your caregiver's questions about your overall health and symptoms is one of the most accurate ways to make sure the cause of your pain is not dangerous. If your caregiver needs more information, he or she may order lab work or imaging tests (X-rays or MRIs).However, even if imaging tests show changes in your back, this usually does not require surgery. HOME CARE INSTRUCTIONS For many people, back pain returns.Since low back pain is rarely dangerous, it is often a condition that people can learn to manageon their own.   Remain active. It is stressful on the back to sit or stand in one place. Do not sit, drive, or stand in one place for more than 30 minutes at a time. Take short walks on level surfaces as soon as pain allows.Try to increase the length of time you walk each day.  Do not stay in bed.Resting more than 1 or 2 days can delay your recovery.  Do not avoid exercise or work.Your body is made to move.It is not dangerous to be active, even though your back may hurt.Your back will likely heal faster if you return to being active before your pain is gone.  Pay attention to your body when you bend and lift. Many people have less discomfortwhen lifting if they bend their knees, keep the load close to their bodies,and  avoid twisting. Often, the most comfortable positions are those that put less stress on your recovering back.  Find a comfortable position to sleep. Use a firm mattress and lie on your side with your knees slightly bent. If you lie on your back, put a pillow under your knees.  Only take over-the-counter or prescription medicines as directed by your caregiver. Over-the-counter medicines to reduce pain and inflammation are often the most helpful.Your caregiver may prescribe muscle relaxant drugs.These medicines help dull your pain so you can more quickly return to your normal activities and healthy exercise.  Put ice on the injured area.  Put ice in a plastic bag.  Place a towel between your skin and the bag.  Leave the ice on for 15-20 minutes, 03-04 times a day for the first 2 to 3 days. After that, ice and heat may be alternated to reduce pain and spasms.  Ask your caregiver about trying back exercises and gentle massage. This may be of some benefit.  Avoid feeling anxious or stressed.Stress increases muscle tension and can worsen back pain.It is important to recognize when you are anxious or stressed and learn ways to manage it.Exercise is a great option. SEEK MEDICAL CARE IF:  You have pain that is not relieved with rest or medicine.  You have pain that does not improve in 1 week.  You have new symptoms.  You are generally not feeling well. SEEK   IMMEDIATE MEDICAL CARE IF:   You have pain that radiates from your back into your legs.  You develop new bowel or bladder control problems.  You have unusual weakness or numbness in your arms or legs.  You develop nausea or vomiting.  You develop abdominal pain.  You feel faint. Document Released: 04/18/2005 Document Revised: 10/18/2011 Document Reviewed: 09/06/2010 Exit[employer] Patient Information 2014 [employer].  

## 2013-05-31 NOTE — Progress Notes (Signed)
   Subjective:    Patient ID: [name], male    DOB: Apr 06, 1941, 73 y.o.   MRN: [id number]  HPI This 73 y.o. male presents for evaluation of back pain.  He states he was watching the football Game eariler in the week and he went to get up and when he did he pulled the muscles in his  Right back.  He has been taking muscle relaxers and they are not helping.  He describes his pain Levels as severe.   Review of Systems C/o back pain No chest pain, SOB, HA, dizziness, vision change, N/V, diarrhea, constipation, dysuria, urinary urgency or frequency or rash.     Objective:   Physical Exam  Vital signs noted  Well developed well nourished male.  HEENT - Head atraumatic Normocephalic                Eyes - PERRLA, Conjuctiva - clear Sclera- Clear EOMI                Throat - oropharanx wnl Respiratory - Lungs CTA bilateral Cardiac - RRR S1 and S2 without murmur MS - TTP right lumbar paraspinous muscles  Lumbar xray - Shows constipation    Assessment & Plan:  Low back pain - Plan: DG Lumbar Spine 2-3 Views.  Norco 5/325 one po qid prn pain #30 Use heating pad and can take xtra tylenol otc as direted.  Constipation - Linzess 290 mcg po qd #10 samples and continue miralax.  Lysbeth Penner FNP

## 2013-06-05 ENCOUNTER — Encounter: Payer: Self-pay | Admitting: *Deleted

## 2013-06-12 ENCOUNTER — Other Ambulatory Visit: Payer: Self-pay | Admitting: Family Medicine

## 2013-06-12 ENCOUNTER — Telehealth: Payer: Self-pay | Admitting: Family Medicine

## 2013-06-12 DIAGNOSIS — M1990 Unspecified osteoarthritis, unspecified site: Secondary | ICD-10-CM

## 2013-06-13 ENCOUNTER — Ambulatory Visit (INDEPENDENT_AMBULATORY_CARE_PROVIDER_SITE_OTHER): Payer: Medicare Other | Admitting: Family Medicine

## 2013-06-13 ENCOUNTER — Encounter: Payer: Self-pay | Admitting: Family Medicine

## 2013-06-13 VITALS — BP 127/75 | HR 83 | Temp 97.3°F | Ht 70.0 in | Wt 197.2 lb

## 2013-06-13 DIAGNOSIS — M549 Dorsalgia, unspecified: Secondary | ICD-10-CM

## 2013-06-13 DIAGNOSIS — I4891 Unspecified atrial fibrillation: Secondary | ICD-10-CM

## 2013-06-13 DIAGNOSIS — J029 Acute pharyngitis, unspecified: Secondary | ICD-10-CM

## 2013-06-13 LAB — POCT RAPID STREP A (OFFICE): Rapid Strep A Screen: NEGATIVE

## 2013-06-13 LAB — POCT INR: INR: 3.2

## 2013-06-13 MED ORDER — AMOXICILLIN 875 MG PO TABS: 875.0000 mg | ORAL_TABLET | Freq: Two times a day (BID) | ORAL | Status: DC

## 2013-06-13 MED ORDER — HYDROCODONE-ACETAMINOPHEN 5-325 MG PO TABS
1.0000 | ORAL_TABLET | Freq: Four times a day (QID) | ORAL | Status: DC | PRN
Start: 2013-06-13 — End: 2014-02-15

## 2013-06-13 NOTE — Patient Instructions (Signed)
Anticoagulation Dose Instructions as of 06/13/2013     [name] Tue Wed Thu Fri Sat   New Dose 5 mg 5 mg 5 mg 5 mg 5 mg 5 mg 5 mg    Description       Hold coumadin one day and then resume and follow up in 2 weeks for repeat INR

## 2013-06-13 NOTE — Progress Notes (Signed)
   Subjective:    Patient ID: [name], male    DOB: 06-29-1940, 73 y.o.   MRN: [id number]  HPI  This 73 y.o. male presents for evaluation of back pain and URI sx's.  He has hx of atrial fibrillation and  Is on coumadin.   He is here for ptinr.  Review of Systems C/o URI sx's and back pain No chest pain, SOB, HA, dizziness, vision change, N/V, diarrhea, constipation, dysuria, urinary urgency or frequency or rash.     Objective:   Physical Exam Vital signs noted  Well developed well nourished male.  HEENT - Head atraumatic Normocephalic                Eyes - PERRLA, Conjuctiva - clear Sclera- Clear EOMI                Ears - EAC's Wnl TM's Wnl Gross Hearing WNL                Nose - Nares patent                 Throat - oropharanx wnl Respiratory - Lungs CTA bilateral Cardiac - RRR S1 and S2 without murmur GI - Abdomen soft Nontender and bowel sounds active x 4 LS - TTP LS spine  Assessment/ Plan  Sore throat - Plan: POCT rapid strep A, amoxicillin (AMOXIL) 875 MG tablet Push po fluids, rest, tylenol and motrin otc prn as directed for fever, arthralgias, and myalgias.  Follow up prn if sx's continue or persist.  Back pain - Plan: HYDROcodone-acetaminophen (NORCO) 5-325 MG per tablet   Atrial fibrillation - Plan: POCT INR - Hold coumadin one day and then resume and follow up in one Day for ptinr.  Lysbeth Penner FNP

## 2013-06-13 NOTE — Addendum Note (Signed)
Addended by: Lysbeth Penner on: 06/13/2013 03:13 PM   Modules accepted: Orders, Level of Service

## 2013-06-19 ENCOUNTER — Ambulatory Visit: Payer: Medicare Other | Admitting: Cardiology

## 2013-06-19 ENCOUNTER — Other Ambulatory Visit: Payer: Medicare Other

## 2013-06-26 ENCOUNTER — Encounter: Payer: Medicare Other | Admitting: *Deleted

## 2013-07-01 ENCOUNTER — Encounter: Payer: Self-pay | Admitting: Internal Medicine

## 2013-07-01 ENCOUNTER — Telehealth: Payer: Self-pay | Admitting: Internal Medicine

## 2013-07-01 ENCOUNTER — Ambulatory Visit (INDEPENDENT_AMBULATORY_CARE_PROVIDER_SITE_OTHER): Payer: Medicare Other | Admitting: *Deleted

## 2013-07-01 DIAGNOSIS — I442 Atrioventricular block, complete: Secondary | ICD-10-CM

## 2013-07-01 DIAGNOSIS — I4891 Unspecified atrial fibrillation: Secondary | ICD-10-CM

## 2013-07-01 LAB — MDC_IDC_ENUM_SESS_TYPE_REMOTE
Battery Remaining Longevity: 106 mo
Brady Statistic RV Percent Paced: 98 %
Implantable Pulse Generator Serial Number: 7282780
Lead Channel Sensing Intrinsic Amplitude: 12 mV
Lead Channel Setting Pacing Amplitude: 2.5 V
Lead Channel Setting Sensing Sensitivity: 2 mV
MDC IDC MSMT BATTERY VOLTAGE: 2.96 V
MDC IDC MSMT LEADCHNL RV IMPEDANCE VALUE: 490 Ohm
MDC IDC SESS DTM: 20150302150604
MDC IDC SET LEADCHNL RV PACING PULSEWIDTH: 0.4 ms

## 2013-07-01 NOTE — Telephone Encounter (Signed)
Spoke w/pt to let know transmission was received/kwm

## 2013-07-01 NOTE — Telephone Encounter (Signed)
Follow up     Did we get remote pacemaker check?

## 2013-07-01 NOTE — Telephone Encounter (Signed)
New message ° ° ° ° °Did we get his remote pacemaker check? °

## 2013-07-02 ENCOUNTER — Telehealth: Payer: Self-pay | Admitting: Cardiology

## 2013-07-02 ENCOUNTER — Ambulatory Visit (INDEPENDENT_AMBULATORY_CARE_PROVIDER_SITE_OTHER): Payer: Medicare Other | Admitting: Pharmacist Clinician (PhC)/ Clinical Pharmacy Specialist

## 2013-07-02 DIAGNOSIS — I4891 Unspecified atrial fibrillation: Secondary | ICD-10-CM

## 2013-07-02 LAB — POCT INR: INR: 2

## 2013-07-02 NOTE — Telephone Encounter (Signed)
New problem   Pt is having problem with his feet the blood Vesels are black on the top of his feet.  Pt would like to speak to you.

## 2013-07-02 NOTE — Telephone Encounter (Signed)
Places are dark. He will let  Dr. Mare Ferrari see at [facility] on Friday, if worse will see local PCP

## 2013-07-05 ENCOUNTER — Other Ambulatory Visit: Payer: Medicare Other

## 2013-07-05 ENCOUNTER — Ambulatory Visit (INDEPENDENT_AMBULATORY_CARE_PROVIDER_SITE_OTHER): Payer: Medicare Other | Admitting: Cardiology

## 2013-07-05 ENCOUNTER — Encounter: Payer: Self-pay | Admitting: Cardiology

## 2013-07-05 VITALS — BP 116/80 | HR 62 | Ht 70.0 in | Wt 186.0 lb

## 2013-07-05 DIAGNOSIS — I2510 Atherosclerotic heart disease of native coronary artery without angina pectoris: Secondary | ICD-10-CM

## 2013-07-05 DIAGNOSIS — I259 Chronic ischemic heart disease, unspecified: Secondary | ICD-10-CM

## 2013-07-05 DIAGNOSIS — I255 Ischemic cardiomyopathy: Secondary | ICD-10-CM

## 2013-07-05 DIAGNOSIS — I5022 Chronic systolic (congestive) heart failure: Secondary | ICD-10-CM

## 2013-07-05 DIAGNOSIS — I480 Paroxysmal atrial fibrillation: Secondary | ICD-10-CM

## 2013-07-05 DIAGNOSIS — I119 Hypertensive heart disease without heart failure: Secondary | ICD-10-CM

## 2013-07-05 DIAGNOSIS — I2589 Other forms of chronic ischemic heart disease: Secondary | ICD-10-CM

## 2013-07-05 DIAGNOSIS — I4891 Unspecified atrial fibrillation: Secondary | ICD-10-CM

## 2013-07-05 DIAGNOSIS — I509 Heart failure, unspecified: Secondary | ICD-10-CM

## 2013-07-05 DIAGNOSIS — F411 Generalized anxiety disorder: Secondary | ICD-10-CM

## 2013-07-05 DIAGNOSIS — F419 Anxiety disorder, unspecified: Secondary | ICD-10-CM

## 2013-07-05 DIAGNOSIS — E785 Hyperlipidemia, unspecified: Secondary | ICD-10-CM

## 2013-07-05 LAB — BASIC METABOLIC PANEL
BUN: 12 mg/dL (ref 6–23)
CO2: 29 mEq/L (ref 19–32)
Calcium: 9 mg/dL (ref 8.4–10.5)
Chloride: 94 mEq/L — ABNORMAL LOW (ref 96–112)
Creatinine, Ser: 1.2 mg/dL (ref 0.4–1.5)
GFR: 64.34 mL/min (ref >60.00)
Glucose, Bld: 88 mg/dL (ref 70–99)
POTASSIUM: 4.8 meq/L (ref 3.5–5.1)
Sodium: 129 mEq/L — ABNORMAL LOW (ref 135–145)

## 2013-07-05 LAB — HEPATIC FUNCTION PANEL
ALT: 23 U/L (ref 0–53)
AST: 21 U/L (ref 0–37)
Albumin: 4.1 g/dL (ref 3.5–5.2)
Alkaline Phosphatase: 63 U/L (ref 39–117)
Bilirubin, Direct: 0.3 mg/dL (ref 0.0–0.3)
Total Bilirubin: 1.5 mg/dL — ABNORMAL HIGH (ref 0.3–1.2)
Total Protein: 6.6 g/dL (ref 6.0–8.3)

## 2013-07-05 LAB — LIPID PANEL
CHOLESTEROL: 105 mg/dL (ref 0–200)
HDL: 38.5 mg/dL — AB (ref >39.00)
LDL Cholesterol: 55 mg/dL (ref 0–99)
TRIGLYCERIDES: 56 mg/dL (ref 0.0–149.0)
Total CHOL/HDL Ratio: 3
VLDL: 11.2 mg/dL (ref 0.0–40.0)

## 2013-07-05 MED ORDER — ALPRAZOLAM 1 MG PO TABS: 1.0000 mg | ORAL_TABLET | Freq: Three times a day (TID) | ORAL | Status: DC | PRN

## 2013-07-05 MED ORDER — NITROGLYCERIN 0.4 MG SL SUBL: 0.4000 mg | SUBLINGUAL_TABLET | SUBLINGUAL | Status: DC | PRN

## 2013-07-05 MED ORDER — ATORVASTATIN CALCIUM 40 MG PO TABS: 40.0000 mg | ORAL_TABLET | ORAL | Status: DC

## 2013-07-05 MED ORDER — ISOSORBIDE MONONITRATE ER 60 MG PO TB24: 60.0000 mg | ORAL_TABLET | Freq: Every day | ORAL | Status: DC

## 2013-07-05 MED ORDER — CARVEDILOL 12.5 MG PO TABS: 6.2500 mg | ORAL_TABLET | Freq: Two times a day (BID) | ORAL | Status: DC

## 2013-07-05 MED ORDER — LISINOPRIL 5 MG PO TABS: 5.0000 mg | ORAL_TABLET | Freq: Two times a day (BID) | ORAL | Status: DC

## 2013-07-05 MED ORDER — DOXEPIN HCL 25 MG PO CAPS: ORAL_CAPSULE | ORAL | Status: DC

## 2013-07-05 MED ORDER — GABAPENTIN 100 MG PO CAPS: 100.0000 mg | ORAL_CAPSULE | Freq: Four times a day (QID) | ORAL | Status: DC

## 2013-07-05 NOTE — Assessment & Plan Note (Signed)
Exercise tolerance remains good and he is not having any symptoms of CHF

## 2013-07-05 NOTE — Assessment & Plan Note (Signed)
The patient is having occasional chest pain.  He thinks that a lot of this may be gastrointestinal.  We are giving him a new prescription for fresh nitroglycerin.  He also has medication for his reflux on hand including Carafate.

## 2013-07-05 NOTE — Patient Instructions (Signed)
DECREASE YOUR LIPITOR TO 40 MG ALTERNATING WITH 20 MG  Your physician recommends that you schedule a follow-up appointment in: 3 months with fasting labs (LP/BMET/HFP/CBC) AND EKG

## 2013-07-05 NOTE — Progress Notes (Signed)
[name] Date of Birth:  1940-10-17 [address] [place], [place]  [address] [phone]         Fax   [phone]  History of Present Illness: This pleasant 73 year old gentleman is seen for a scheduled 3 month office visit. He was  admitted to [facility] on 03/09/12 and discharged on 03/11/12.  He was admitted after he complained of having some chest discomfort at the time of a routine pacemaker check up. He was found to be in atrial fibrillation at the time of admission. On the date of discharge 11//13 the patient underwent a Lexus scan Myoview stress test which revealed left ventricular ejection fraction 32% with septal and inferior wall motion abnormalities and with an inferior lateral scar but no evidence of reversible ischemia. His prior ejection fraction at the time of catheter in 2012 was approximately 40%. 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 His last cardiac catheterization in October 2012 showed that his grafts were patent. The patient also has a history of hypercholesterolemia and essential hypertension. He has had a prior history of paroxysmal atrial flutter with ablation therapy and subsequent heart block which required a [employer] pacemaker which was implanted in 2012. The patient is on long-term Coumadin. He has a lot of anxiety. On 05/25/12 the patient underwent elective cardioversion which was successful. Normal sinus rhythm and was returned as was confirmed by intracardiac electrogram via his pacemaker. However, within 24 hours after the cardioversion he felt that his pulse was irregular again. EKG confirmed that he was back in atrial flutter fibrillation. He was essentially asymptomatic and states that the arrhythmia doesn't really bother him much.  Strategy is rate control and long-term anticoagulation.  He is intolerant of Xarelto because of itching and is back on long-term Coumadin.  The patient was readmitted to the  hospital in November 2014 for chest pain.  He underwent another nuclear stress test on 03/27/13.  This showed an ejection fraction of 33% and showed an inferior wall scar but no reversible ischemia.  There was inferior wall dyskinesis.  Since his last hospitalization he has been temporarily off his gabapentin which she was taking for neuropathic pain.  Since last visit he has had some atypical chest pain which is likely to be secondary to reflux. Current Outpatient Prescriptions  Medication Sig Dispense Refill  . acetaminophen (TYLENOL) 500 MG tablet Take 500 mg by mouth every 8 (eight) hours as needed.      . ALPRAZolam (XANAX) 1 MG tablet Take 1 tablet (1 mg total) by mouth 3 (three) times daily as needed. For anxiety  270 tablet  1  . amoxicillin (AMOXIL) 875 MG tablet Take 1 tablet (875 mg total) by mouth 2 (two) times daily.  20 tablet  0  . atorvastatin (LIPITOR) 40 MG tablet Take 40 mg by mouth as directed. 1 TABLET ALTERNATING WITH 1/2 TABLET      . carvedilol (COREG) 12.5 MG tablet Take 6.25 mg by mouth 2 (two) times daily with a meal.      . cyclobenzaprine (FLEXERIL) 5 MG tablet Take 1 tablet (5 mg total) by mouth 3 (three) times daily as needed for muscle spasms.  30 tablet  0  . doxepin (SINEQUAN) 25 MG capsule Take 25-50 mg by mouth 2 (two) times daily. 1 capsule every morning and 2 capsules at night      . furosemide (LASIX) 20 MG  tablet       . gabapentin (NEURONTIN) 100 MG capsule Take 1 capsule (100 mg total) by mouth 4 (four) times daily.  360 capsule  3  . HYDROcodone-acetaminophen (NORCO) 5-325 MG per tablet Take 1 tablet by mouth every 6 (six) hours as needed for moderate pain.  30 tablet  0  . hydroxypropyl methylcellulose (ISOPTO TEARS) 2.5 % ophthalmic solution Place 1 drop into both eyes 4 (four) times daily as needed. Dry eyes      . isosorbide mononitrate (IMDUR) 60 MG 24 hr tablet Take 1 tablet (60 mg total) by mouth daily.  90 tablet  3  . lisinopril (PRINIVIL,ZESTRIL) 5  MG tablet Take 1 tablet (5 mg total) by mouth 2 (two) times daily.  180 tablet  30  . loratadine (CLARITIN) 10 MG tablet Take 10 mg by mouth daily.      . Multiple Vitamin (MULTIVITAMIN) tablet Take 1 tablet by mouth daily.        . nitroGLYCERIN (NITROSTAT) 0.4 MG SL tablet Place 0.4 mg under the tongue every 5 (five) minutes as needed. For chest pain      . omeprazole (PRILOSEC) 20 MG capsule Take 1 capsule (20 mg total) by mouth 2 (two) times daily.  180 capsule  3  . polyethylene glycol (MIRALAX / GLYCOLAX) packet Take 13 g by mouth daily.      [name] Kitchen warfarin (COUMADIN) 5 MG tablet Take 5 mg by mouth at bedtime.        No current facility-administered medications for this visit.    Allergies  Allergen Reactions  . Sulfa Drugs Cross Reactors Other (See Comments)    unknown  . Ultram [Tramadol Hcl] Itching    "don't remember how bad"  . Xarelto [Rivaroxaban] Other (See Comments)    Itching     Patient Active Problem List   Diagnosis Date Noted  . Cough, persistent 11/15/2010    Priority: High  . Hx of CABG 09/23/2010    Priority: High  . Hypertension     Priority: High  . Anxiety     Priority: High  . Hyperlipidemia     Priority: Medium  . Chest pain 04/12/2013  . Midsternal chest pain 03/27/2013  . Ventral hernia 01/23/2013  . Angina pectoris 08/30/2012  . Chronic systolic CHF (congestive heart failure), NYHA class 2   . Chronic anticoagulation   . Atypical chest pain 03/11/2012  . Umbilical hernia 01/05/2012  . CAD (coronary artery disease) 10/20/2011  . Atrial fibrillation-Permanent 05/31/2011  . 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 Pacemaker-stJudes 05/31/2011  . Chest pain 04/11/2011  . Ischemic cardiomyopathy   . GERD (gastroesophageal reflux disease)   . Fibromyalgia     History  Smoking status  . Former Smoker -- 0.50 packs/day for 10 years  . Types: Cigarettes  . Quit date: 09/21/1973  Smokeless tobacco  . Never Used    History  Alcohol  Use No    Family History  Problem Relation Age of Onset  . Stroke Mother   . Heart attack Father     died @ 56, mult MI's.    Review of Systems: Constitutional: no fever chills diaphoresis or fatigue or change in weight.  Head and neck: no hearing loss, no epistaxis, no photophobia or visual disturbance. Respiratory: No cough, shortness of breath or wheezing. Cardiovascular: No chest pain peripheral edema, palpitations. Gastrointestinal: No abdominal distention, no abdominal pain, no change in bowel habits hematochezia or melena. Genitourinary: No dysuria, no  frequency, no urgency, no nocturia. Musculoskeletal:No arthralgias, no back pain, no gait disturbance or myalgias. Neurological: No dizziness, no headaches, no numbness, no seizures, no syncope, no weakness, no tremors. Hematologic: No lymphadenopathy, no easy bruising. Psychiatric: No confusion, no hallucinations, no sleep disturbance.    Physical Exam: Filed Vitals:   07/05/13 1445  BP: 116/80  Pulse: 62   the general appearance reveals a well-developed well-nourished talkative gentleman in no distress.The head and neck exam reveals pupils equal and reactive.  Extraocular movements are full.  There is no scleral icterus.  The mouth and pharynx are normal.  The neck is supple.  The carotids reveal no bruits.  The jugular venous pressure is normal.  The  thyroid is not enlarged.  There is no lymphadenopathy.  The chest is clear to percussion and auscultation.  There are no rales or rhonchi.  Expansion of the chest is symmetrical.  The precordium is quiet.  The first heart sound is normal.  The second heart sound is physiologically split.  There is no murmur gallop rub or click.  There is no abnormal lift or heave.  The abdomen is soft and nontender.  The bowel sounds are normal.  The liver and spleen are not enlarged.  There is a lemon-sized ventral hernia present which is not tender.  There are no abdominal bruits.  Extremities  reveal good pedal pulses.  There is no phlebitis or edema.  There is no cyanosis or clubbing.  Strength is normal and symmetrical in all extremities.  There is no lateralizing weakness.  There are no sensory deficits.  The skin is warm and dry.  There is no rash.   Assessment / Plan:  Continue same medication except reduce Lipitor to down to 20 mg alternating with 40 mg daily. Recheck for office visit and EKG and CBC lipid panel basal metabolic panel and hepatic function panel in 3 months

## 2013-07-05 NOTE — Assessment & Plan Note (Signed)
His lipids have been very good with total cholesterol is under 100 frequently.  We will cut back on his Lipitor to just 40 mg one day alternating with 20 the next.  He did not want to cut back any further than that at this point.  We will plan to recheck fasting lipids at his next office visit in 3 months and consider further reductions at that time.  He is having some discomfort in his legs which he attributes to his fibromyalgia but may also be secondary to his statin therapy.

## 2013-07-08 ENCOUNTER — Other Ambulatory Visit: Payer: Self-pay | Admitting: Cardiology

## 2013-07-08 ENCOUNTER — Telehealth: Payer: Self-pay | Admitting: *Deleted

## 2013-07-08 NOTE — Telephone Encounter (Signed)
Message copied by Earvin Hansen on Mon Jul 08, 2013 12:07 PM ------      Message from: Darlin Coco      Created: Fri Jul 05, 2013  9:10 PM       Lab okay except sodium is low.  Try increasing dietary salt slightly. ------

## 2013-07-08 NOTE — Telephone Encounter (Signed)
Advised patient of lab results  

## 2013-07-08 NOTE — Telephone Encounter (Signed)
PA approved for doxepin through 06/27/2014

## 2013-07-08 NOTE — Telephone Encounter (Signed)
PA through [facility] approved the alprazolam through 06/27/2014

## 2013-07-09 ENCOUNTER — Encounter: Payer: Self-pay | Admitting: *Deleted

## 2013-07-15 ENCOUNTER — Telehealth: Payer: Self-pay | Admitting: Cardiology

## 2013-07-15 NOTE — Telephone Encounter (Signed)
New message     Pt got a report (xray of lumbar spine) from [facility]----he want to talk to a nurse regarding the findings. Something about an abdominal aortia with plaque.

## 2013-07-15 NOTE — Telephone Encounter (Signed)
It would not be surprising for his aorta to have calcium and plaque since he also has known coronary disease.  However [place] and I would be happy to look at the x-ray.  Tell him not to be too concerned about it as he tends to get quite excited and upset.

## 2013-07-15 NOTE — Telephone Encounter (Signed)
Pt was told that on his spine xray results that he had extensive atherosclerosis of the aorta. Pt is concerned by this and will go and sign out the xray so Dr Mare Ferrari can see it. Pt aware dr/nurse are out today and will return tomorrow.

## 2013-07-16 NOTE — Telephone Encounter (Signed)
Dr. Mare Ferrari reviewed, no aneurysm noted. Patient to continue current medications and watch cholesterol.

## 2013-07-16 NOTE — Telephone Encounter (Signed)
Patient brought disk by for review

## 2013-07-17 NOTE — Telephone Encounter (Signed)
Spoke with patient and advised

## 2013-07-22 ENCOUNTER — Telehealth: Payer: Self-pay | Admitting: Cardiology

## 2013-07-22 NOTE — Telephone Encounter (Signed)
Spoke with patient and explained there was no blockage in aorta but some plaque build up per  Dr. Mare Ferrari. Continue careful diet and he will discuss further at next ov

## 2013-07-22 NOTE — Telephone Encounter (Signed)
New message  Patient would like to know how bad the blockage is, he forgot to ask when you called with results. Please call and advise.

## 2013-08-06 ENCOUNTER — Telehealth: Payer: Self-pay | Admitting: Cardiology

## 2013-08-06 NOTE — Telephone Encounter (Signed)
Advised patient ok to pick up disc next week

## 2013-08-06 NOTE — Telephone Encounter (Signed)
New message    Want to come by next Tuesday to pick up the disc on his back.  He brought it to let Dr Mare Ferrari see it.  He also want to talk to [place].

## 2013-08-12 ENCOUNTER — Ambulatory Visit (INDEPENDENT_AMBULATORY_CARE_PROVIDER_SITE_OTHER): Payer: Medicare Other | Admitting: Family Medicine

## 2013-08-12 ENCOUNTER — Encounter: Payer: Self-pay | Admitting: Family Medicine

## 2013-08-12 ENCOUNTER — Telehealth: Payer: Self-pay | Admitting: Cardiology

## 2013-08-12 VITALS — BP 122/72 | HR 80 | Temp 97.0°F | Ht 70.0 in | Wt 193.0 lb

## 2013-08-12 DIAGNOSIS — R21 Rash and other nonspecific skin eruption: Secondary | ICD-10-CM

## 2013-08-12 DIAGNOSIS — I255 Ischemic cardiomyopathy: Secondary | ICD-10-CM

## 2013-08-12 DIAGNOSIS — Z950 Presence of cardiac pacemaker: Secondary | ICD-10-CM

## 2013-08-12 DIAGNOSIS — I2589 Other forms of chronic ischemic heart disease: Secondary | ICD-10-CM

## 2013-08-12 DIAGNOSIS — K219 Gastro-esophageal reflux disease without esophagitis: Secondary | ICD-10-CM

## 2013-08-12 DIAGNOSIS — F419 Anxiety disorder, unspecified: Secondary | ICD-10-CM

## 2013-08-12 DIAGNOSIS — Z7901 Long term (current) use of anticoagulants: Secondary | ICD-10-CM

## 2013-08-12 DIAGNOSIS — I4891 Unspecified atrial fibrillation: Secondary | ICD-10-CM

## 2013-08-12 DIAGNOSIS — I2510 Atherosclerotic heart disease of native coronary artery without angina pectoris: Secondary | ICD-10-CM

## 2013-08-12 DIAGNOSIS — F411 Generalized anxiety disorder: Secondary | ICD-10-CM

## 2013-08-12 DIAGNOSIS — K429 Umbilical hernia without obstruction or gangrene: Secondary | ICD-10-CM

## 2013-08-12 MED ORDER — MUPIROCIN 2 % EX OINT: TOPICAL_OINTMENT | CUTANEOUS | Status: AC

## 2013-08-12 NOTE — Telephone Encounter (Signed)
New message     On warfarin.  His fingers are tingling, itching all over.  Pt states he has an ulcer on each leg.  Could this be a reaction from the warfarin?

## 2013-08-12 NOTE — Progress Notes (Signed)
Patient ID: [name], male   DOB: [birth year], 73 y.o.   MRN: [id number] SUBJECTIVE: CC: Chief Complaint  Patient presents with  . Acute Visit    ?ulcer on groin area on warfarin    HPI: Has itchy areas in the groin. No fever. This rash has occurred for several days and more lesions are cropping up. No fever. He is very anxious about this and wonders if it is due to the warfarin. His psychologist told him to check with his PCP.  Past Medical History  Diagnosis Date  . Ischemic cardiomyopathy     a. 06/2012 EF 25% by echo.  . Hypertension   . Hyperlipidemia   . GERD (gastroesophageal reflux disease)   . CAD (coronary artery disease)     a. 1991 s/p CABGx4;  b. redo CABG in 2007 (SVG->OM, VG->PDA->PLV);  c. 01/2011 Cath: LM 80ost, LAD 100p, LCX 100ost, OM1 100, RCA 100, LIMA->LAD ok, VG->PDA ok, VG->OM ok;  d. 03/2012 MV: EF 32% inf & inflat scar w/o ischemia; e. Lexi MV: EF 33%, inf infarct, no ischemia.  . Atrial flutter     a. 01/2011 s/p unsuccessful RFCA complicated by CHB req PPM.  . CHB (complete heart block)     a. 01/2011: in setting of RFCA, s/p SJM 2110 [facility] DC PPM, ser # [id number].  [name] Kitchen Kidney cysts     a. bilateral  . Hiatal hernia     a. 03/01/2013 EGD and esoph dil.  [name] Kitchen BPH (benign prostatic hyperplasia)     a. elevated PSA  . A-fib     a. chronic coumadin  . Elevated PSA, less than 10 ng/ml   . Fibromyalgia   . Skin cancer     'above left eyebrow; tip of my nose" (08/29/2012)  . Depression     "since 1986-1987" (08/29/2012)  . Anxiety   . Panic attacks   . Chronic systolic CHF (congestive heart failure), NYHA class 2     a. 06/2012 Echo: EF 25%, distal sept and apical AK, sev dil LV, mild LVH, mod dil LA.   Past Surgical History  Procedure Laterality Date  . Coronary artery bypass graft  1991    CABG X 5  . Coronary artery bypass graft  06/2005    CABG X 3  . Cardiac catheterization  10/30/2009    Grafts patent. Mild to moderate LV dysfunction. EF 45%.  Managed medically.   . Cardiac catheterization      multiple  . Tonsillectomy  1960  . Cholecystectomy  2007  . Insert / replace / remove pacemaker  01/2011    initial placement  . Inguinal hernia repair  1960's    "left I think"  . Cataract extraction w/ intraocular lens  implant, bilateral  2012  . Cardioversion  05/25/2012    Procedure: CARDIOVERSION;  Surgeon: Darlin Coco, MD;  Location: [facility] ENDOSCOPY;  Service: Cardiovascular;  Laterality: N/A;  . Humerus surgery Left 1960    "growth on the bone taken off; not cancer" (08/29/2012)  . Excisional hemorrhoidectomy  ~ 1980  . Skin cancer excision      'above left eyebrow; tip of my nose" (08/29/2012)  . Atrial flutter ablation  01/2011    "didn't work" (08/29/2012)   History   Social History  . Marital Status: Widowed    Spouse Name: N/A    Number of Children: 2  . Years of Education: 12   Occupational History  . Not on file.  Social History Main Topics  . Smoking status: Former Smoker -- 0.50 packs/day for 10 years    Types: Cigarettes    Quit date: 09/21/1973  . Smokeless tobacco: Never Used  . Alcohol Use: No  . Drug Use: No  . Sexual Activity: No   Other Topics Concern  . Not on file   Social History Narrative   Wife passed away in 08-15-11. Lives in [place] with his daughter and her family.  Retired from [employer].   Family History  Problem Relation Age of Onset  . Stroke Mother   . Heart attack Father     died @ 36, mult MI's.   Current Outpatient Prescriptions on File Prior to Visit  Medication Sig Dispense Refill  . acetaminophen (TYLENOL) 500 MG tablet Take 500 mg by mouth every 8 (eight) hours as needed.      . ALPRAZolam (XANAX) 1 MG tablet Take 1 tablet (1 mg total) by mouth 3 (three) times daily as needed. For anxiety  270 tablet  1  . atorvastatin (LIPITOR) 40 MG tablet Take 1 tablet (40 mg total) by mouth as directed. 1 TABLET ALTERNATING WITH 1/2 TABLET  70 tablet  3  . carvedilol  (COREG) 12.5 MG tablet Take 0.5 tablets (6.25 mg total) by mouth 2 (two) times daily with a meal.  90 tablet  3  . cyclobenzaprine (FLEXERIL) 5 MG tablet Take 1 tablet (5 mg total) by mouth 3 (three) times daily as needed for muscle spasms.  30 tablet  0  . doxepin (SINEQUAN) 25 MG capsule 1 capsule every morning and 2 capsules at night  270 capsule  3  . furosemide (LASIX) 20 MG tablet       . gabapentin (NEURONTIN) 100 MG capsule Take 1 capsule (100 mg total) by mouth 4 (four) times daily.  360 capsule  3  . HYDROcodone-acetaminophen (NORCO) 5-325 MG per tablet Take 1 tablet by mouth every 6 (six) hours as needed for moderate pain.  30 tablet  0  . hydroxypropyl methylcellulose (ISOPTO TEARS) 2.5 % ophthalmic solution Place 1 drop into both eyes 4 (four) times daily as needed. Dry eyes      . isosorbide mononitrate (IMDUR) 60 MG 24 hr tablet Take 1 tablet (60 mg total) by mouth daily.  90 tablet  3  . lisinopril (PRINIVIL,ZESTRIL) 5 MG tablet Take 1 tablet (5 mg total) by mouth 2 (two) times daily.  180 tablet  3  . loratadine (CLARITIN) 10 MG tablet Take 10 mg by mouth daily.      . Multiple Vitamin (MULTIVITAMIN) tablet Take 1 tablet by mouth daily.        . nitroGLYCERIN (NITROSTAT) 0.4 MG SL tablet Place 1 tablet (0.4 mg total) under the tongue every 5 (five) minutes as needed. For chest pain  100 tablet  prn  . omeprazole (PRILOSEC) 20 MG capsule Take 1 capsule (20 mg total) by mouth 2 (two) times daily.  180 capsule  3  . polyethylene glycol (MIRALAX / GLYCOLAX) packet Take 13 g by mouth daily.      [name] Kitchen warfarin (COUMADIN) 5 MG tablet Take 5 mg by mouth at bedtime.       [name] Kitchen amoxicillin (AMOXIL) 875 MG tablet Take 1 tablet (875 mg total) by mouth 2 (two) times daily.  20 tablet  0   No current facility-administered medications on file prior to visit.   Allergies  Allergen Reactions  . Sulfa Drugs Cross Reactors Other (See Comments)  unknown  . Ultram [Tramadol Hcl] Itching    "don't  remember how bad"  . Xarelto [Rivaroxaban] Other (See Comments)    Itching    Immunization History  Administered Date(s) Administered  . Influenza,inj,Quad PF,36+ Mos 02/04/2013  . Tdap 01/14/2011  . Zoster 01/14/2011   Prior to Admission medications   Medication Sig Start Date End Date Taking? Authorizing Provider  acetaminophen (TYLENOL) 500 MG tablet Take 500 mg by mouth every 8 (eight) hours as needed.    Historical Provider, MD  ALPRAZolam Duanne Moron) 1 MG tablet Take 1 tablet (1 mg total) by mouth 3 (three) times daily as needed. For anxiety 07/05/13   Darlin Coco, MD  amoxicillin (AMOXIL) 875 MG tablet Take 1 tablet (875 mg total) by mouth 2 (two) times daily. 06/13/13   Lysbeth Penner, FNP  atorvastatin (LIPITOR) 40 MG tablet Take 1 tablet (40 mg total) by mouth as directed. 1 TABLET ALTERNATING WITH 1/2 TABLET 07/08/13   Darlin Coco, MD  carvedilol (COREG) 12.5 MG tablet Take 0.5 tablets (6.25 mg total) by mouth 2 (two) times daily with a meal. 07/05/13   Darlin Coco, MD  cyclobenzaprine (FLEXERIL) 5 MG tablet Take 1 tablet (5 mg total) by mouth 3 (three) times daily as needed for muscle spasms. 05/23/13   Mary-Margaret Hassell Done, FNP  doxepin (SINEQUAN) 25 MG capsule 1 capsule every morning and 2 capsules at night 07/05/13   Darlin Coco, MD  furosemide (LASIX) 20 MG tablet  04/22/13   Historical Provider, MD  gabapentin (NEURONTIN) 100 MG capsule Take 1 capsule (100 mg total) by mouth 4 (four) times daily. 07/05/13   Darlin Coco, MD  HYDROcodone-acetaminophen (NORCO) 5-325 MG per tablet Take 1 tablet by mouth every 6 (six) hours as needed for moderate pain. 06/13/13   Lysbeth Penner, FNP  hydroxypropyl methylcellulose (ISOPTO TEARS) 2.5 % ophthalmic solution Place 1 drop into both eyes 4 (four) times daily as needed. Dry eyes    Historical Provider, MD  isosorbide mononitrate (IMDUR) 60 MG 24 hr tablet Take 1 tablet (60 mg total) by mouth daily. 07/05/13   Darlin Coco, MD   lisinopril (PRINIVIL,ZESTRIL) 5 MG tablet Take 1 tablet (5 mg total) by mouth 2 (two) times daily. 07/05/13   Darlin Coco, MD  loratadine (CLARITIN) 10 MG tablet Take 10 mg by mouth daily.    Historical Provider, MD  Multiple Vitamin (MULTIVITAMIN) tablet Take 1 tablet by mouth daily.      Historical Provider, MD  nitroGLYCERIN (NITROSTAT) 0.4 MG SL tablet Place 1 tablet (0.4 mg total) under the tongue every 5 (five) minutes as needed. For chest pain 07/05/13   Darlin Coco, MD  omeprazole (PRILOSEC) 20 MG capsule Take 1 capsule (20 mg total) by mouth 2 (two) times daily. 03/25/13   Darlin Coco, MD  polyethylene glycol [facility] / GLYCOLAX) packet Take 13 g by mouth daily.    Historical Provider, MD  warfarin (COUMADIN) 5 MG tablet Take 5 mg by mouth at bedtime.     Historical Provider, MD     ROS: As above in the HPI. All other systems are stable or negative.  OBJECTIVE: APPEARANCE:  Patient in no acute distress.The patient appeared well nourished and normally developed. Acyanotic. Waist: VITAL SIGNS:BP 122/72  Pulse 80  Temp(Src) 97 F (36.1 C) (Oral)  Ht 5\' 10"  (1.778 m)  Wt 193 lb (87.544 kg)  BMI 27.69 kg/m2 Anxious WM  SKIN: warm and  Dry. RASH see photo in the media. 2  lesions on the right inner thigh and one on th eleft inner thigh. Red and moist.apperaing raised plaque.  HEAD and Neck: without JVD, Head and scalp: normal Eyes:No scleral icterus. Fundi normal, eye movements normal. Ears: Auricle normal, canal normal, Tympanic membranes normal, insufflation normal. Nose: normal Throat: normal Neck & thyroid: normal  CHEST & LUNGS: Chest wall: normal Lungs: Clear  CVS: Reveals the PMI to be normally located. Regular rhythm, First and Second Heart sounds are normal,  absence of murmurs, rubs or gallops. Peripheral vasculature: Radial pulses: normal Dorsal pedis pulses: normal Posterior pulses: normal  ABDOMEN:  Appearance: normal Benign, no  organomegaly, no masses, no Abdominal Aortic enlargement. No Guarding , no rebound. No Bruits. Bowel sounds: normal  RECTAL: N/A GU: N/A  EXTREMETIES: nonedematous.  MUSCULOSKELETAL:  Spine: normal Joints: intact  NEUROLOGIC: oriented to time,place and person; nonfocal. Strength is normal Sensory is normal Reflexes are normal Cranial Nerves are normal.  ASSESSMENT: Pacemaker-stJudes  Ischemic cardiomyopathy  Chronic anticoagulation  CAD (coronary artery disease)  Atrial fibrillation-Permanent  Umbilical hernia  GERD (gastroesophageal reflux disease)  Anxiety  Rash and nonspecific skin eruption - Plan: mupirocin ointment (BACTROBAN) 2 % Suspect impetigo  PLAN: Reassurance. See his psychologist in regards to his anxiety disorder. No orders of the defined types were placed in this encounter.   Meds ordered this encounter  Medications  . mupirocin ointment (BACTROBAN) 2 %    Sig: Apply three times  A day to the rash on the thighsfor 10 days.    Dispense:  30 g    Refill:  1  wash with soap and water.  There are no discontinued medications. Return if symptoms worsen or fail to improve.  Natsumi Whitsitt P. Jacelyn Grip, M.D.

## 2013-08-12 NOTE — Telephone Encounter (Signed)
Left message to call back  

## 2013-08-13 ENCOUNTER — Ambulatory Visit (INDEPENDENT_AMBULATORY_CARE_PROVIDER_SITE_OTHER): Payer: Medicare Other | Admitting: Pharmacist Clinician (PhC)/ Clinical Pharmacy Specialist

## 2013-08-13 ENCOUNTER — Telehealth: Payer: Self-pay | Admitting: Cardiology

## 2013-08-13 DIAGNOSIS — I4891 Unspecified atrial fibrillation: Secondary | ICD-10-CM

## 2013-08-13 LAB — POCT INR: INR: 2.8

## 2013-08-13 NOTE — Telephone Encounter (Signed)
Will forward to  Dr. Brackbill for review 

## 2013-08-13 NOTE — Telephone Encounter (Signed)
New message     Can he use voltaren gel four times a day?  He has back problems.  Dr Nelva Bush at [facility] orthopedic prescribed it.  He said he is a heart patient and he may not be able to use this with his other medications

## 2013-08-13 NOTE — Telephone Encounter (Signed)
Spoke with patient today, he saw PCP yesterday

## 2013-08-13 NOTE — Telephone Encounter (Signed)
Since he is on coumadin we prefer that patients avoid NSAIDS as much as possible. Would try to get by on tylenol. If not effective then use as little of the voltaren gel as possible.

## 2013-08-16 NOTE — Telephone Encounter (Signed)
Advised patient

## 2013-08-21 ENCOUNTER — Other Ambulatory Visit: Payer: Self-pay

## 2013-08-21 DIAGNOSIS — I255 Ischemic cardiomyopathy: Secondary | ICD-10-CM

## 2013-08-21 MED ORDER — LISINOPRIL 5 MG PO TABS: 5.0000 mg | ORAL_TABLET | Freq: Two times a day (BID) | ORAL | Status: DC

## 2013-08-22 ENCOUNTER — Other Ambulatory Visit: Payer: Self-pay | Admitting: Physical Medicine and Rehabilitation

## 2013-08-22 DIAGNOSIS — M5137 Other intervertebral disc degeneration, lumbosacral region: Secondary | ICD-10-CM

## 2013-08-26 ENCOUNTER — Ambulatory Visit
Admission: RE | Admit: 2013-08-26 | Discharge: 2013-08-26 | Disposition: A | Discharge status: Home / Self Care | Payer: Medicare Other | Source: Ambulatory Visit | Attending: Physical Medicine and Rehabilitation | Admitting: Physical Medicine and Rehabilitation

## 2013-08-26 DIAGNOSIS — M5137 Other intervertebral disc degeneration, lumbosacral region: Secondary | ICD-10-CM

## 2013-08-29 ENCOUNTER — Encounter: Payer: Self-pay | Admitting: *Deleted

## 2013-09-03 ENCOUNTER — Ambulatory Visit (INDEPENDENT_AMBULATORY_CARE_PROVIDER_SITE_OTHER): Payer: Medicare Other | Admitting: Family Medicine

## 2013-09-03 VITALS — BP 131/76 | HR 80 | Temp 97.0°F | Wt 192.8 lb

## 2013-09-03 DIAGNOSIS — J029 Acute pharyngitis, unspecified: Secondary | ICD-10-CM

## 2013-09-03 LAB — POCT RAPID STREP A (OFFICE): Rapid Strep A Screen: NEGATIVE

## 2013-09-03 MED ORDER — AZITHROMYCIN 250 MG PO TABS: ORAL_TABLET | ORAL | Status: DC

## 2013-09-03 NOTE — Progress Notes (Signed)
   Subjective:    Patient ID: [name], male    DOB: 01-Jan-1941, 73 y.o.   MRN: [id number]  HPI This 73 y.o. male presents for evaluation of pharyngitis and was exposed to grandaughter who has Been tx' recently for strep throat.   Review of Systems C/o pharyngitis No chest pain, SOB, HA, dizziness, vision change, N/V, diarrhea, constipation, dysuria, urinary urgency or frequency, myalgias, arthralgias or rash.     Objective:   Physical Exam Vital signs noted  Well developed well nourished male.  HEENT - Head atraumatic Normocephalic                Eyes - PERRLA, Conjuctiva - clear Sclera- Clear EOMI                Ears - EAC's Wnl TM's Wnl Gross Hearing WNL                Nose - Nares patent                 Throat - oropharanx injected Respiratory - Lungs CTA bilateral Cardiac - RRR S1 and S2 without murmur GI - Abdomen soft Nontender and bowel sounds active x 4        Assessment & Plan:  Sore throat - Plan: POCT rapid strep A, azithromycin (ZITHROMAX) 250 MG tablet  Acute pharyngitis - Plan: azithromycin (ZITHROMAX) 250 MG tablet  Push po fluids, rest, tylenol and motrin otc prn as directed for fever, arthralgias, and myalgias.  Follow up prn if sx's continue or persist.  Lysbeth Penner FNP

## 2013-09-04 ENCOUNTER — Telehealth: Payer: Self-pay | Admitting: *Deleted

## 2013-09-04 MED ORDER — AMOXICILLIN 500 MG PO CAPS: 500.0000 mg | ORAL_CAPSULE | Freq: Three times a day (TID) | ORAL | Status: DC

## 2013-09-04 NOTE — Telephone Encounter (Signed)
Please change patient to amoxicillin 500  3 times daily #30 no refills and to discontinue the Z-Pak

## 2013-09-04 NOTE — Telephone Encounter (Signed)
Patient called and stated that he was given a zpack yesterday and took 2. His fingers, toes, and lips are tingling. I advised him to not take anymore and take benadryl and that I will speak with a provider today and see about getting his abx changed

## 2013-09-04 NOTE — Telephone Encounter (Signed)
Patient aware and amoxicillin sent to pharmacy

## 2013-09-06 ENCOUNTER — Ambulatory Visit (INDEPENDENT_AMBULATORY_CARE_PROVIDER_SITE_OTHER): Payer: Medicare Other | Admitting: Family Medicine

## 2013-09-06 ENCOUNTER — Encounter: Payer: Self-pay | Admitting: Family Medicine

## 2013-09-06 ENCOUNTER — Telehealth: Payer: Self-pay | Admitting: Family Medicine

## 2013-09-06 VITALS — BP 118/69 | HR 72 | Temp 97.6°F | Ht 70.0 in | Wt 194.0 lb

## 2013-09-06 DIAGNOSIS — R200 Anesthesia of skin: Secondary | ICD-10-CM

## 2013-09-06 DIAGNOSIS — R202 Paresthesia of skin: Principal | ICD-10-CM

## 2013-09-06 DIAGNOSIS — R209 Unspecified disturbances of skin sensation: Secondary | ICD-10-CM

## 2013-09-06 NOTE — Telephone Encounter (Signed)
appt given for bill today at 12:15

## 2013-09-06 NOTE — Progress Notes (Signed)
   Subjective:    Patient ID: [name], male    DOB: [birth year], 73 y.o.   MRN: [id number]  HPI This 73 y.o. male presents for evaluation of numbness and tingling in hands.   Review of Systems No chest pain, SOB, HA, dizziness, vision change, N/V, diarrhea, constipation, dysuria, urinary urgency or frequency, myalgias, arthralgias or rash.     Objective:   Physical Exam  Vital signs noted  Well developed well nourished male.  HEENT - Head atraumatic Normocephalic                Eyes - PERRLA, Conjuctiva - clear Sclera- Clear EOMI                Ears - EAC's Wnl TM's Wnl Gross Hearing WNL                Throat - oropharanx wnl Respiratory - Lungs CTA bilateral Extremities - No edema. Neuro - Grossly intact.      Assessment & Plan:  Numbness and tingling in hands - Plan: Vitamin B12  Lysbeth Penner FNP

## 2013-09-07 LAB — VITAMIN B12: Vitamin B-12: 704 pg/mL (ref 211–946)

## 2013-09-11 ENCOUNTER — Telehealth: Payer: Self-pay | Admitting: Family Medicine

## 2013-09-11 NOTE — Telephone Encounter (Signed)
Pt aware of labs  

## 2013-09-11 NOTE — Telephone Encounter (Signed)
Message copied by Waverly Ferrari on Wed Sep 11, 2013 10:06 AM ------      Message from: Lysbeth Penner      Created: Tue Sep 10, 2013  5:22 PM       b12 ok ------

## 2013-09-24 ENCOUNTER — Ambulatory Visit (INDEPENDENT_AMBULATORY_CARE_PROVIDER_SITE_OTHER): Payer: Medicare Other | Admitting: Pharmacist Clinician (PhC)/ Clinical Pharmacy Specialist

## 2013-09-24 DIAGNOSIS — I4891 Unspecified atrial fibrillation: Secondary | ICD-10-CM

## 2013-09-24 LAB — POCT INR: INR: 2.5

## 2013-10-03 ENCOUNTER — Other Ambulatory Visit: Payer: Self-pay | Admitting: Pharmacist

## 2013-10-03 MED ORDER — WARFARIN SODIUM 5 MG PO TABS: 5.0000 mg | ORAL_TABLET | Freq: Every day | ORAL | Status: DC

## 2013-10-03 NOTE — Telephone Encounter (Signed)
[name] has left message about this - I called patient to clarify since it appears several of his medications were sent to [facility] for 90 supply with RF in march 2015.  Patient states he only needed warfarin sent to [facility].  Done and patient notified.

## 2013-10-07 ENCOUNTER — Ambulatory Visit (INDEPENDENT_AMBULATORY_CARE_PROVIDER_SITE_OTHER): Payer: Medicare Other | Admitting: *Deleted

## 2013-10-07 ENCOUNTER — Ambulatory Visit (INDEPENDENT_AMBULATORY_CARE_PROVIDER_SITE_OTHER): Payer: Medicare Other | Admitting: Ophthalmology

## 2013-10-07 ENCOUNTER — Telehealth: Payer: Self-pay | Admitting: Cardiology

## 2013-10-07 DIAGNOSIS — I442 Atrioventricular block, complete: Secondary | ICD-10-CM

## 2013-10-07 LAB — MDC_IDC_ENUM_SESS_TYPE_REMOTE
Battery Remaining Percentage: 82 %
Battery Voltage: 2.96 V
Brady Statistic RV Percent Paced: 98 %
Implantable Pulse Generator Model: 2110
Lead Channel Impedance Value: 490 Ohm
Lead Channel Pacing Threshold Pulse Width: 0.4 ms
Lead Channel Sensing Intrinsic Amplitude: 12 mV
Lead Channel Setting Pacing Amplitude: 2.5 V
Lead Channel Setting Pacing Pulse Width: 0.4 ms
MDC IDC MSMT BATTERY REMAINING LONGEVITY: 112 mo
MDC IDC MSMT LEADCHNL RV PACING THRESHOLD AMPLITUDE: 0.75 V
MDC IDC PG SERIAL: 7282780
MDC IDC SESS DTM: 20150608161955
MDC IDC SET LEADCHNL RV SENSING SENSITIVITY: 2 mV

## 2013-10-07 NOTE — Progress Notes (Signed)
Remote pacemaker transmission.   

## 2013-10-07 NOTE — Telephone Encounter (Signed)
Spoke with pt and reminded pt of remote transmission that is due today. Pt verbalized understanding.   

## 2013-10-08 ENCOUNTER — Ambulatory Visit (INDEPENDENT_AMBULATORY_CARE_PROVIDER_SITE_OTHER): Payer: Medicare Other | Admitting: Family Medicine

## 2013-10-08 VITALS — BP 122/70 | HR 81 | Temp 97.3°F | Ht 70.0 in | Wt 192.6 lb

## 2013-10-08 DIAGNOSIS — W57XXXA Bitten or stung by nonvenomous insect and other nonvenomous arthropods, initial encounter: Secondary | ICD-10-CM

## 2013-10-08 DIAGNOSIS — T148 Other injury of unspecified body region: Secondary | ICD-10-CM

## 2013-10-08 MED ORDER — DOXYCYCLINE HYCLATE 100 MG PO TABS: 100.0000 mg | ORAL_TABLET | Freq: Two times a day (BID) | ORAL | Status: DC

## 2013-10-08 NOTE — Progress Notes (Signed)
   Subjective:    Patient ID: [name], male    DOB: [birth year], 73 y.o.   MRN: [id number]  HPI  This 73 y.o. male presents for evaluation of tick bite and rash.  Review of Systems    No chest pain, SOB, HA, dizziness, vision change, N/V, diarrhea, constipation, dysuria, urinary urgency or frequency, myalgias, arthralgias.  Objective:   Physical Exam  Vital signs noted  Well developed well nourished male.  HEENT - Head atraumatic Normocephalic                Eyes - PERRLA, Conjuctiva - clear Sclera- Clear EOMI                Ears - EAC's Wnl TM's Wnl Gross Hearing WNL                Throat - oropharanx wnl Respiratory - Lungs CTA bilateral Cardiac - RRR S1 and S2 without murmur GI - Abdomen soft Nontender and bowel sounds active x 4 Skin - Rash on right thoracic spine    Assessment & Plan:  Tick bite - Plan: doxycycline (VIBRA-TABS) 100 MG tablet  Lysbeth Penner FNP

## 2013-10-10 ENCOUNTER — Other Ambulatory Visit: Payer: Medicare Other

## 2013-10-10 ENCOUNTER — Ambulatory Visit (INDEPENDENT_AMBULATORY_CARE_PROVIDER_SITE_OTHER): Payer: Medicare Other | Admitting: Ophthalmology

## 2013-10-10 ENCOUNTER — Ambulatory Visit (INDEPENDENT_AMBULATORY_CARE_PROVIDER_SITE_OTHER): Payer: Medicare Other | Admitting: Cardiology

## 2013-10-10 ENCOUNTER — Encounter: Payer: Self-pay | Admitting: Cardiology

## 2013-10-10 VITALS — BP 130/76 | HR 62 | Ht 70.0 in | Wt 192.0 lb

## 2013-10-10 DIAGNOSIS — E785 Hyperlipidemia, unspecified: Secondary | ICD-10-CM

## 2013-10-10 DIAGNOSIS — I4891 Unspecified atrial fibrillation: Secondary | ICD-10-CM

## 2013-10-10 DIAGNOSIS — I480 Paroxysmal atrial fibrillation: Secondary | ICD-10-CM

## 2013-10-10 DIAGNOSIS — I10 Essential (primary) hypertension: Secondary | ICD-10-CM

## 2013-10-10 DIAGNOSIS — H43819 Vitreous degeneration, unspecified eye: Secondary | ICD-10-CM

## 2013-10-10 DIAGNOSIS — I209 Angina pectoris, unspecified: Secondary | ICD-10-CM

## 2013-10-10 DIAGNOSIS — I119 Hypertensive heart disease without heart failure: Secondary | ICD-10-CM

## 2013-10-10 DIAGNOSIS — H35039 Hypertensive retinopathy, unspecified eye: Secondary | ICD-10-CM

## 2013-10-10 DIAGNOSIS — H33309 Unspecified retinal break, unspecified eye: Secondary | ICD-10-CM

## 2013-10-10 LAB — CBC WITH DIFFERENTIAL/PLATELET
BASOS PCT: 0.4 % (ref 0.0–3.0)
Basophils Absolute: 0 10*3/uL (ref 0.0–0.1)
EOS PCT: 2.2 % (ref 0.0–5.0)
Eosinophils Absolute: 0.1 10*3/uL (ref 0.0–0.7)
HEMATOCRIT: 39.5 % (ref 39.0–52.0)
Hemoglobin: 13.1 g/dL (ref 13.0–17.0)
Lymphocytes Relative: 27.4 % (ref 12.0–46.0)
Lymphs Abs: 1.6 10*3/uL (ref 0.7–4.0)
MCHC: 33.2 g/dL (ref 30.0–36.0)
MCV: 92.6 fl (ref 78.0–100.0)
Monocytes Absolute: 0.4 10*3/uL (ref 0.1–1.0)
Monocytes Relative: 7.8 % (ref 3.0–12.0)
NEUTROS PCT: 62.2 % (ref 43.0–77.0)
Neutro Abs: 3.6 10*3/uL (ref 1.4–7.7)
Platelets: 166 10*3/uL (ref 150.0–400.0)
RBC: 4.27 Mil/uL (ref 4.22–5.81)
RDW: 13.8 % (ref 11.5–15.5)
WBC: 5.7 10*3/uL (ref 4.0–10.5)

## 2013-10-10 LAB — HEPATIC FUNCTION PANEL
ALK PHOS: 67 U/L (ref 39–117)
ALT: 16 U/L (ref 0–53)
AST: 21 U/L (ref 0–37)
Albumin: 3.8 g/dL (ref 3.5–5.2)
BILIRUBIN DIRECT: 0.2 mg/dL (ref 0.0–0.3)
Total Bilirubin: 1.2 mg/dL (ref 0.2–1.2)
Total Protein: 6.2 g/dL (ref 6.0–8.3)

## 2013-10-10 LAB — BASIC METABOLIC PANEL
BUN: 12 mg/dL (ref 6–23)
CO2: 31 mEq/L (ref 19–32)
Calcium: 8.9 mg/dL (ref 8.4–10.5)
Chloride: 93 mEq/L — ABNORMAL LOW (ref 96–112)
Creatinine, Ser: 1.1 mg/dL (ref 0.4–1.5)
GFR: 69.72 mL/min (ref >60.00)
GLUCOSE: 90 mg/dL (ref 70–99)
POTASSIUM: 4.4 meq/L (ref 3.5–5.1)
Sodium: 127 mEq/L — ABNORMAL LOW (ref 135–145)

## 2013-10-10 LAB — LIPID PANEL
Cholesterol: 93 mg/dL (ref 0–200)
HDL: 39.5 mg/dL (ref >39.00)
LDL CALC: 42 mg/dL (ref 0–99)
NONHDL: 53.5
Total CHOL/HDL Ratio: 2
Triglycerides: 60 mg/dL (ref 0.0–149.0)
VLDL: 12 mg/dL (ref 0.0–40.0)

## 2013-10-10 NOTE — Assessment & Plan Note (Signed)
His angina pectoris has been infrequent.  He has not had to take any recent sublingual nitroglycerin

## 2013-10-10 NOTE — Progress Notes (Signed)
Quick Note:  Please report to patient. The recent labs are stable. Continue same medication and careful diet. ______ 

## 2013-10-10 NOTE — Progress Notes (Signed)
[name] Date of Birth:  1940/12/04 [facility] [address] [place], [place]  [address] [phone]        Fax   [phone]   History of Present Illness: This pleasant 73 year old gentleman is seen for a scheduled 3 month office visit. He was admitted to [facility] on 03/09/12 and discharged on 03/11/12. He was admitted after he complained of having some chest discomfort at the time of a routine pacemaker check up. He was found to be in atrial fibrillation at the time of admission. On the date of discharge 11//13 the patient underwent a Lexus scan Myoview stress test which revealed left ventricular ejection fraction 32% with septal and inferior wall motion abnormalities and with an inferior lateral scar but no evidence of reversible ischemia. His prior ejection fraction at the time of catheter in 2012 was approximately 40%. 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 His last cardiac catheterization in October 2012 showed that his grafts were patent. The patient also has a history of hypercholesterolemia and essential hypertension. He has had a prior history of paroxysmal atrial flutter with ablation therapy and subsequent heart block which required a [employer] pacemaker which was implanted in 2012. The patient is on long-term Coumadin. He has a lot of anxiety. On 05/25/12 the patient underwent elective cardioversion which was successful. Normal sinus rhythm and was returned as was confirmed by intracardiac electrogram via his pacemaker. However, within 24 hours after the cardioversion he felt that his pulse was irregular again. EKG confirmed that he was back in atrial flutter fibrillation. He was essentially asymptomatic and states that the arrhythmia doesn't really bother him much. Strategy is rate control and long-term anticoagulation. He is intolerant of Xarelto because of itching and is back on long-term Coumadin. The patient was  readmitted to the hospital in November 2014 for chest pain. He underwent another nuclear stress test on 03/27/13. This showed an ejection fraction of 33% and showed an inferior wall scar but no reversible ischemia.  Since last visit he has had occasional chest discomfort.  He has continued to walk regularly on his treadmill for exercise to Earlier this week the patient was diagnosed with a infected tick bite and is currently taking doxycycline 100 mg twice a day.   Current Outpatient Prescriptions  Medication Sig Dispense Refill  . acetaminophen (TYLENOL) 500 MG tablet Take 500 mg by mouth every 8 (eight) hours as needed.      . ALPRAZolam (XANAX) 1 MG tablet Take 1 tablet (1 mg total) by mouth 3 (three) times daily as needed. For anxiety  270 tablet  1  . atorvastatin (LIPITOR) 40 MG tablet Take 1 tablet (40 mg total) by mouth as directed. 1 TABLET ALTERNATING WITH 1/2 TABLET  70 tablet  3  . carvedilol (COREG) 12.5 MG tablet Take 0.5 tablets (6.25 mg total) by mouth 2 (two) times daily with a meal.  90 tablet  3  . cyclobenzaprine (FLEXERIL) 5 MG tablet Take 1 tablet (5 mg total) by mouth 3 (three) times daily as needed for muscle spasms.  30 tablet  0  . doxepin (SINEQUAN) 25 MG capsule 1 capsule every morning and 2 capsules at night  270 capsule  3  . doxycycline (VIBRA-TABS) 100 MG tablet Take 1 tablet (100 mg total) by mouth 2 (two) times daily.  20 tablet  0  . furosemide (LASIX) 20 MG tablet       .  gabapentin (NEURONTIN) 100 MG capsule Take 1 capsule (100 mg total) by mouth 4 (four) times daily.  360 capsule  3  . HYDROcodone-acetaminophen (NORCO) 5-325 MG per tablet Take 1 tablet by mouth every 6 (six) hours as needed for moderate pain.  30 tablet  0  . hydroxypropyl methylcellulose (ISOPTO TEARS) 2.5 % ophthalmic solution Place 1 drop into both eyes 4 (four) times daily as needed. Dry eyes      . isosorbide mononitrate (IMDUR) 60 MG 24 hr tablet Take 1 tablet (60 mg total) by mouth  daily.  90 tablet  3  . ketoconazole (NIZORAL) 2 % cream       . lisinopril (PRINIVIL,ZESTRIL) 5 MG tablet Take 1 tablet (5 mg total) by mouth 2 (two) times daily.  180 tablet  3  . loratadine (CLARITIN) 10 MG tablet Take 10 mg by mouth daily.      . Multiple Vitamin (MULTIVITAMIN) tablet Take 1 tablet by mouth daily.        . nitroGLYCERIN (NITROSTAT) 0.4 MG SL tablet Place 1 tablet (0.4 mg total) under the tongue every 5 (five) minutes as needed. For chest pain  100 tablet  prn  . omeprazole (PRILOSEC) 20 MG capsule Take 1 capsule (20 mg total) by mouth 2 (two) times daily.  180 capsule  3  . polyethylene glycol (MIRALAX / GLYCOLAX) packet Take 13 g by mouth daily.      [name] Kitchen warfarin (COUMADIN) 5 MG tablet Take 1 tablet (5 mg total) by mouth at bedtime.  90 tablet  2  . mupirocin ointment (BACTROBAN) 2 %        No current facility-administered medications for this visit.    Allergies  Allergen Reactions  . Sulfa Drugs Cross Reactors Other (See Comments)    unknown  . Ultram [Tramadol Hcl] Itching    "don't remember how bad"  . Xarelto [Rivaroxaban] Other (See Comments)    Itching   . Zithromax [Azithromycin]     Patient Active Problem List   Diagnosis Date Noted  . Cough, persistent 11/15/2010    Priority: High  . Hx of CABG 09/23/2010    Priority: High  . Hypertension     Priority: High  . Anxiety     Priority: High  . Hyperlipidemia     Priority: Medium  . Chest pain 04/12/2013  . Midsternal chest pain 03/27/2013  . Ventral hernia 01/23/2013  . Angina pectoris 08/30/2012  . Chronic systolic CHF (congestive heart failure), NYHA class 2   . Chronic anticoagulation   . Atypical chest pain 03/11/2012  . Umbilical hernia 01/60/1093  . CAD (coronary artery disease) 10/20/2011  . Atrial fibrillation-Permanent 05/31/2011  . 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 Pacemaker-stJudes 05/31/2011  . Chest pain 04/11/2011  . Ischemic cardiomyopathy   . GERD  (gastroesophageal reflux disease)   . Fibromyalgia     History  Smoking status  . Former Smoker -- 0.50 packs/day for 10 years  . Types: Cigarettes  . Quit date: 09/21/1973  Smokeless tobacco  . Never Used    History  Alcohol Use No    Family History  Problem Relation Age of Onset  . Stroke Mother   . Heart attack Father     died @ 2, mult MI's.    Review of Systems: Constitutional: no fever chills diaphoresis or fatigue or change in weight.  Head and neck: no hearing loss, no epistaxis, no photophobia or visual disturbance. Respiratory: No cough, shortness of breath  or wheezing. Cardiovascular: No chest pain peripheral edema, palpitations. Gastrointestinal: No abdominal distention, no abdominal pain, no change in bowel habits hematochezia or melena. Genitourinary: No dysuria, no frequency, no urgency, no nocturia. Musculoskeletal:No arthralgias, no back pain, no gait disturbance or myalgias. Neurological: No dizziness, no headaches, no numbness, no seizures, no syncope, no weakness, no tremors. Hematologic: No lymphadenopathy, no easy bruising. Psychiatric: No confusion, no hallucinations, no sleep disturbance.    Physical Exam: Filed Vitals:   10/10/13 0938  BP: 130/76  Pulse: 62   the general appearance reveals a well-developed well-nourished somewhat anxious gentleman in no acute distress.The head and neck exam reveals pupils equal and reactive.  Extraocular movements are full.  There is no scleral icterus.  The mouth and pharynx are normal.  The neck is supple.  The carotids reveal no bruits.  The jugular venous pressure is normal.  The  thyroid is not enlarged.  There is no lymphadenopathy.  The chest is clear to percussion and auscultation.  There are no rales or rhonchi.  Expansion of the chest is symmetrical.  The precordium is quiet.  The first heart sound is normal.  The second heart sound is physiologically split.  There is no murmur gallop rub or click.  There  is no abnormal lift or heave.  The abdomen is soft and nontender.  The bowel sounds are normal.  The liver and spleen are not enlarged.  There are no abdominal masses.  There are no abdominal bruits.  Extremities reveal good pedal pulses.  There is no phlebitis or edema.  There is no cyanosis or clubbing.  Strength is normal and symmetrical in all extremities.  There is no lateralizing weakness.  There are no sensory deficits.  The skin is warm and dry.  There is no rash.  There is a infected tick bite on the right posterior thorax.     Assessment / Plan: 1.  Ischemic heart disease status post CABG remotely and status post redo CABG in 2007. 2. Hyperlipidemia 3. hypertensive heart disease without heart failure 4. Anxiety 5. permanent atrial fibrillation with functioning ventricular pacemaker.  Plan: Continue same medication.  His weight is up 6 pounds since last visit and I have encouraged him to lose more weight.   I have encouraged him to complete his tick bites therapy of doxycycline 100 mg twice a day for 10 days. Blood work today pending.  Recheck in 3 months for followup office visit.

## 2013-10-10 NOTE — Assessment & Plan Note (Signed)
His blood pressure has been remaining stable on current therapy.  No headaches dizziness or syncope.  No palpitations.

## 2013-10-10 NOTE — Patient Instructions (Signed)
Your physician recommends that you continue on your current medications as directed. Please refer to the Current Medication list given to you today.  Your physician recommends that you schedule a follow-up appointment in: 3 month ov 

## 2013-10-10 NOTE — Assessment & Plan Note (Signed)
The patient has not had any TIA or stroke symptoms.  He remains on long-term warfarin for his atrial fib

## 2013-10-15 ENCOUNTER — Encounter (INDEPENDENT_AMBULATORY_CARE_PROVIDER_SITE_OTHER): Payer: Medicare Other

## 2013-10-21 ENCOUNTER — Ambulatory Visit (INDEPENDENT_AMBULATORY_CARE_PROVIDER_SITE_OTHER): Payer: Medicare Other | Admitting: Ophthalmology

## 2013-10-23 ENCOUNTER — Telehealth: Payer: Self-pay | Admitting: Cardiology

## 2013-10-23 NOTE — Telephone Encounter (Signed)
Calling regarding lowish blood pressure of 90s/50s after taking new muscle relaxant tizanidine this PM. Also with heart burn symptoms. Recommend he discontinue new medication (seems to have several meds that may interact including flexeril, doxepin, ativan, gabapentin). If pain though to be cardiac in origin (he denies), then he should seek emergent care. Patient verbalizes understanding of the plan and agreeable.

## 2013-10-30 ENCOUNTER — Encounter: Payer: Self-pay | Admitting: Cardiology

## 2013-11-05 ENCOUNTER — Ambulatory Visit (INDEPENDENT_AMBULATORY_CARE_PROVIDER_SITE_OTHER): Payer: Medicare Other | Admitting: Pharmacist Clinician (PhC)/ Clinical Pharmacy Specialist

## 2013-11-05 DIAGNOSIS — I4891 Unspecified atrial fibrillation: Secondary | ICD-10-CM

## 2013-11-05 LAB — POCT INR: INR: 2.7

## 2013-11-06 ENCOUNTER — Telehealth: Payer: Self-pay | Admitting: Family Medicine

## 2013-11-06 NOTE — Telephone Encounter (Signed)
Take warfarin 5mg  1/2 tablet now, then starting tonight restart regular dose of 5mg  1 tablet daily.  Continue as planned to recheck INR next month.

## 2013-11-20 ENCOUNTER — Encounter: Payer: Self-pay | Admitting: Internal Medicine

## 2013-11-20 ENCOUNTER — Encounter: Payer: Self-pay | Admitting: Family Medicine

## 2013-11-20 ENCOUNTER — Ambulatory Visit (INDEPENDENT_AMBULATORY_CARE_PROVIDER_SITE_OTHER): Payer: Medicare Other | Admitting: Family Medicine

## 2013-11-20 ENCOUNTER — Telehealth: Payer: Self-pay | Admitting: *Deleted

## 2013-11-20 VITALS — BP 142/78 | HR 81 | Temp 97.3°F | Ht 70.0 in | Wt 195.0 lb

## 2013-11-20 DIAGNOSIS — J209 Acute bronchitis, unspecified: Secondary | ICD-10-CM

## 2013-11-20 DIAGNOSIS — J208 Acute bronchitis due to other specified organisms: Secondary | ICD-10-CM

## 2013-11-20 MED ORDER — AZITHROMYCIN 250 MG PO TABS: ORAL_TABLET | ORAL | Status: DC

## 2013-11-20 MED ORDER — METHYLPREDNISOLONE ACETATE 80 MG/ML IJ SUSP
80.0000 mg | Freq: Once | INTRAMUSCULAR | Status: AC
  Administered 2013-11-20: 80 mg via INTRAMUSCULAR

## 2013-11-20 NOTE — Progress Notes (Signed)
   Subjective:    Patient ID: [name], male    DOB: [birth year], 73 y.o.   MRN: [id number]  HPI    Review of Systems     Objective:   Physical Exam        Assessment & Plan:  Discussed with patient that zpak is listed as allergy and he states he has had this and did not have allergy.  He wants to try zpak again and discussed if having rash then DC.  Lysbeth Penner FNP

## 2013-11-20 NOTE — Progress Notes (Signed)
   Subjective:    Patient ID: [name], male    DOB: January 11, 1941, 73 y.o.   MRN: [id number]  HPI This 73 y.o. male presents for evaluation of URI sx's. He has had persistent dry cough and sinus drainage over the last few days  Review of Systems No chest pain, SOB, HA, dizziness, vision change, N/V, diarrhea, constipation, dysuria, urinary urgency or frequency, myalgias, arthralgias or rash.     Objective:   Physical Exam  Vital signs noted  Well developed well nourished male.  HEENT - Head atraumatic Normocephalic                Eyes - PERRLA, Conjuctiva - clear Sclera- Clear EOMI                Ears - EAC's Wnl TM's Wnl Gross Hearing WNL                Nose - Nares patent                 Throat - oropharanx wnl Respiratory - Lungs CTA bilateral Cardiac - RRR S1 and S2 without murmur GI - Abdomen soft Nontender and bowel sounds active x 4 Extremities - No edema. Neuro - Grossly intact.      Assessment & Plan:  Acute bronchitis due to other specified organisms - Plan: azithromycin (ZITHROMAX) 250 MG tablet, methylPREDNISolone acetate (DEPO-MEDROL) injection 80 mg Push po fluids, rest, tylenol and motrin otc prn as directed for fever, arthralgias, and myalgias.  Follow up prn if sx's continue or persist.  Lysbeth Penner FNP

## 2013-11-20 NOTE — Telephone Encounter (Signed)
Patient called in stating that he took his zpack at 1:30 and is now itching and tingling. I advised patient to take benadryl and I would send message to [facility] and see what he would recommend

## 2013-11-21 ENCOUNTER — Other Ambulatory Visit: Payer: Self-pay | Admitting: Family Medicine

## 2013-11-21 DIAGNOSIS — J029 Acute pharyngitis, unspecified: Secondary | ICD-10-CM

## 2013-11-21 MED ORDER — LEVOFLOXACIN 500 MG PO TABS: 500.0000 mg | ORAL_TABLET | Freq: Every day | ORAL | Status: DC

## 2013-11-21 MED ORDER — AMOXICILLIN 875 MG PO TABS: 875.0000 mg | ORAL_TABLET | Freq: Two times a day (BID) | ORAL | Status: DC

## 2013-11-21 NOTE — Telephone Encounter (Signed)
Tell him to stop zpak if itching

## 2013-11-21 NOTE — Telephone Encounter (Signed)
Patient would like to know if we are going to send in another antibiotic ?

## 2013-11-21 NOTE — Telephone Encounter (Signed)
Pt notified of RX for amoxicillin Pt informed that the last time he tried Amoxicillin he broke out in rash Per [employer] rx was changed to [place] understanding

## 2013-11-21 NOTE — Telephone Encounter (Signed)
Took dose of Benadryl of yesterday evening. Advised to take another dose of benadryl this morning and that his message has been routed to provider for antibiotic change. We will notify him when it has been done.

## 2013-11-21 NOTE — Telephone Encounter (Signed)
Amoxicillin sent to his pharm

## 2013-11-22 ENCOUNTER — Telehealth: Payer: Self-pay | Admitting: Cardiology

## 2013-11-22 NOTE — Telephone Encounter (Signed)
Will forward to  Dr. Brackbill for review 

## 2013-11-22 NOTE — Telephone Encounter (Signed)
Advised patient

## 2013-11-22 NOTE — Telephone Encounter (Signed)
Okay to take levofloxacin

## 2013-11-22 NOTE — Telephone Encounter (Signed)
New message     PCP want pt to take a medication but he want to ask Dr Mare Ferrari about it first.  It is lebofloxacin 500mg  ---1 tablet daily for 7 days

## 2013-11-25 ENCOUNTER — Other Ambulatory Visit: Payer: Self-pay | Admitting: *Deleted

## 2013-11-25 ENCOUNTER — Telehealth: Payer: Self-pay | Admitting: Family Medicine

## 2013-11-25 NOTE — Telephone Encounter (Signed)
Spoke with patient and he states that he has taken 3 pills of the new antibiotic. His tongue is very sore and has white patches all over. Do you want him to continue with the antibiotic and give him magic mouth wash? Please advise. Not having any SOB or chest pain .

## 2013-11-27 ENCOUNTER — Encounter: Payer: Self-pay | Admitting: *Deleted

## 2013-11-28 ENCOUNTER — Telehealth: Payer: Self-pay | Admitting: Family Medicine

## 2013-11-29 ENCOUNTER — Telehealth: Payer: Self-pay | Admitting: Cardiology

## 2013-11-29 ENCOUNTER — Ambulatory Visit (INDEPENDENT_AMBULATORY_CARE_PROVIDER_SITE_OTHER): Payer: Medicare Other | Admitting: Family Medicine

## 2013-11-29 ENCOUNTER — Encounter: Payer: Self-pay | Admitting: Family Medicine

## 2013-11-29 VITALS — BP 138/67 | HR 78 | Temp 97.1°F | Ht 70.0 in | Wt 188.0 lb

## 2013-11-29 DIAGNOSIS — B370 Candidal stomatitis: Secondary | ICD-10-CM

## 2013-11-29 MED ORDER — FLUCONAZOLE 150 MG PO TABS: 150.0000 mg | ORAL_TABLET | Freq: Once | ORAL | Status: DC

## 2013-11-29 NOTE — Telephone Encounter (Addendum)
New message      Pt is having a reaction to the medication he is taking for thrush.  He says it is making his skin "crawl".  Patient has questions about taking benadryl with this medication.

## 2013-11-29 NOTE — Progress Notes (Signed)
   Subjective:    Patient ID: [name], male    DOB: June 25, 1940, 73 y.o.   MRN: [id number]  HPI C/o sore tongue and oral thrush.  He is worried he may be having allergy to nystatin oral solution. He has been tx with multiple abx's for uri and has been having difficulty being allergic to many of them. He now is getting over oral thrush.   Review of Systems No chest pain, SOB, HA, dizziness, vision change, N/V, diarrhea, constipation, dysuria, urinary urgency or frequency, myalgias, arthralgias or rash.     Objective:   Physical Exam   Vital signs noted  Well developed well nourished male.  HEENT - Head atraumatic Normocephalic                Eyes - PERRLA, Conjuctiva - clear Sclera- Clear EOMI                Ears - EAC's Wnl TM's Wnl Gross Hearing WNL                Nose - Nares patent                 Throat - oropharanx with red tongue Respiratory - Lungs CTA bilateral Cardiac - RRR S1 and S2 without murmur      Assessment & Plan:  Oral thrush - Plan: fluconazole (DIFLUCAN) 150 MG tablet One po qd x 2days #2 w/1 rf Continue nystatin oral solution  Lysbeth Penner FNP

## 2013-11-29 NOTE — Telephone Encounter (Signed)
Patient came in to be seen

## 2013-11-29 NOTE — Telephone Encounter (Signed)
Pt states he was given Nystatin 10000 for the thrush. States it is causing his skin to crawl. States he has called the prescriber back & they told him to keep taking it & take Benadryl.  Denies any rash or itching. "just feels like my skin is crawling" skin feels more sensitive  He has called his pcp & awaiting call back. He will not take anymore until hearing back from them. Horton Chin RN

## 2013-12-04 ENCOUNTER — Telehealth: Payer: Self-pay | Admitting: Cardiology

## 2013-12-04 NOTE — Telephone Encounter (Signed)
Received call directly from operator for patient c/o SOB.  Patient states he has been noticing increasing SOB over the past few weeks; no difficulty talking noted during our conversation.  States he saw his PCP earlier this week and he advised patient he did not see a direct cause of his SOB and advised him to call his cardiologist.  Patient denies chest pain, swelling, or breathlessness; states it just seems difficult to feel like he can take a full deep breath.  Patient states he would feel more confident if he could see Dr. Mare Ferrari to r/o heart problems.  Patient scheduled for Friday 8/7 and advised to call back sooner with worsening symptoms or concerns.  Patient verbalized understanding and agreement and gratitude for the soon appointment.

## 2013-12-04 NOTE — Telephone Encounter (Signed)
New message     Pt says he is sob.

## 2013-12-04 NOTE — Telephone Encounter (Signed)
Agree with plan 

## 2013-12-06 ENCOUNTER — Encounter: Payer: Self-pay | Admitting: Cardiology

## 2013-12-06 ENCOUNTER — Ambulatory Visit (INDEPENDENT_AMBULATORY_CARE_PROVIDER_SITE_OTHER): Payer: Medicare Other | Admitting: Cardiology

## 2013-12-06 ENCOUNTER — Ambulatory Visit
Admission: RE | Admit: 2013-12-06 | Discharge: 2013-12-06 | Disposition: A | Discharge status: Home / Self Care | Payer: Medicare Other | Source: Ambulatory Visit | Attending: Cardiology | Admitting: Cardiology

## 2013-12-06 VITALS — BP 120/72 | HR 60 | Ht 70.0 in | Wt 187.0 lb

## 2013-12-06 DIAGNOSIS — I119 Hypertensive heart disease without heart failure: Secondary | ICD-10-CM

## 2013-12-06 DIAGNOSIS — F419 Anxiety disorder, unspecified: Secondary | ICD-10-CM

## 2013-12-06 DIAGNOSIS — F411 Generalized anxiety disorder: Secondary | ICD-10-CM

## 2013-12-06 DIAGNOSIS — I442 Atrioventricular block, complete: Secondary | ICD-10-CM

## 2013-12-06 DIAGNOSIS — I10 Essential (primary) hypertension: Secondary | ICD-10-CM

## 2013-12-06 DIAGNOSIS — R011 Cardiac murmur, unspecified: Secondary | ICD-10-CM

## 2013-12-06 DIAGNOSIS — R0600 Dyspnea, unspecified: Secondary | ICD-10-CM

## 2013-12-06 DIAGNOSIS — I509 Heart failure, unspecified: Secondary | ICD-10-CM

## 2013-12-06 DIAGNOSIS — R0609 Other forms of dyspnea: Secondary | ICD-10-CM

## 2013-12-06 DIAGNOSIS — R0989 Other specified symptoms and signs involving the circulatory and respiratory systems: Secondary | ICD-10-CM

## 2013-12-06 DIAGNOSIS — I5022 Chronic systolic (congestive) heart failure: Secondary | ICD-10-CM

## 2013-12-06 NOTE — Assessment & Plan Note (Signed)
The patient has been experiencing more shortness of breath at times.  However he continues to be able to walk on his treadmill at home 5 days a week.  He walks a mile in 23 minutes.  He does not seem to have any problem doing that.  At other times he is short of breath with minimal activity however.  We will update his chest x-ray and echocardiogram

## 2013-12-06 NOTE — Assessment & Plan Note (Signed)
Patient continues to have a lot of problems with anxiety

## 2013-12-06 NOTE — Assessment & Plan Note (Signed)
The patient developed complete heart block after radiofrequency catheter ablation for atrial flutter.  He has a functioning ventricular pacemaker.  His underlying atrial rhythm is atrial fibrillation and is on long-term Coumadin.  He has not had any TIA or stroke symptoms

## 2013-12-06 NOTE — Patient Instructions (Addendum)
Your physician recommends that you continue on your current medications as directed. Please refer to the Current Medication list given to you today.  Your physician has requested that you have an echocardiogram. Echocardiography is a painless test that uses sound waves to create images of your heart. It provides your doctor with information about the size and shape of your heart and how well your heart's chambers and valves are working. This procedure takes approximately one hour. There are no restrictions for this procedure.  Keep your September appointment   A chest x-ray takes a picture of the organs and structures inside the chest, including the heart, lungs, and blood vessels. This test can show several things, including, whether the heart is enlarges; whether fluid is building up in the lungs; and whether pacemaker / defibrillator leads are still in place.

## 2013-12-06 NOTE — Progress Notes (Signed)
[name] Date of Birth:  10-Jan-1941 [place] [address] [place], [place]  [address] [phone]        Fax   [phone]   History of Present Illness: This pleasant 73 year old gentleman is seen for a work in office visit.  He has been having more shortness of breath at times. He was admitted to [facility] on 03/09/12 and discharged on 03/11/12. He was admitted after he complained of having some chest discomfort at the time of a routine pacemaker check up. He was found to be in atrial fibrillation at the time of admission. On the date of discharge 11//13 the patient underwent a Lexus scan Myoview stress test which revealed left ventricular ejection fraction 32% with septal and inferior wall motion abnormalities and with an inferior lateral scar but no evidence of reversible ischemia. His prior ejection fraction at the time of catheter in 2012 was approximately 40%. 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 His last cardiac catheterization in October 2012 showed that his grafts were patent. The patient also has a history of hypercholesterolemia and essential hypertension. He has had a prior history of paroxysmal atrial flutter with ablation therapy and subsequent heart block which required a [employer] Jude pacemaker which was implanted in 2012. The patient is on long-term Coumadin. He has a lot of anxiety. On 05/25/12 the patient underwent elective cardioversion which was successful. Normal sinus rhythm and was returned as was confirmed by intracardiac electrogram via his pacemaker. However, within 24 hours after the cardioversion he felt that his pulse was irregular again. EKG confirmed that he was back in atrial flutter fibrillation. He was essentially asymptomatic and states that the arrhythmia doesn't really bother him much. Strategy is rate control and long-term anticoagulation. He is intolerant of Xarelto because of itching and is back on  long-term Coumadin. The patient was readmitted to the hospital in November 2014 for chest pain. He underwent another nuclear stress test on 03/27/13. This showed an ejection fraction of 33% and showed an inferior wall scar but no reversible ischemia.  Since last visit he has had occasional chest discomfort.  He has continued to walk regularly on his treadmill for exercise to The patient has a history of significant anxiety.  Current Outpatient Prescriptions  Medication Sig Dispense Refill  . acetaminophen (TYLENOL) 500 MG tablet Take 500 mg by mouth every 8 (eight) hours as needed.      . ALPRAZolam (XANAX) 1 MG tablet Take 1 tablet (1 mg total) by mouth 3 (three) times daily as needed. For anxiety  270 tablet  1  . atorvastatin (LIPITOR) 40 MG tablet Take 1 tablet (40 mg total) by mouth as directed. 1 TABLET ALTERNATING WITH 1/2 TABLET  70 tablet  3  . carvedilol (COREG) 12.5 MG tablet Take 0.5 tablets (6.25 mg total) by mouth 2 (two) times daily with a meal.  90 tablet  3  . cyclobenzaprine (FLEXERIL) 5 MG tablet Take 1 tablet (5 mg total) by mouth 3 (three) times daily as needed for muscle spasms.  30 tablet  0  . doxepin (SINEQUAN) 25 MG capsule 1 capsule every morning and 2 capsules at night  270 capsule  3  . fluconazole (DIFLUCAN) 150 MG tablet Take 1 tablet (150 mg total) by mouth once.  2 tablet  1  . furosemide (LASIX) 20 MG tablet       . gabapentin (NEURONTIN) 100  MG capsule Take 1 capsule (100 mg total) by mouth 4 (four) times daily.  360 capsule  3  . HYDROcodone-acetaminophen (NORCO) 5-325 MG per tablet Take 1 tablet by mouth every 6 (six) hours as needed for moderate pain.  30 tablet  0  . hydroxypropyl methylcellulose (ISOPTO TEARS) 2.5 % ophthalmic solution Place 1 drop into both eyes 4 (four) times daily as needed. Dry eyes      . isosorbide mononitrate (IMDUR) 60 MG 24 hr tablet Take 1 tablet (60 mg total) by mouth daily.  90 tablet  3  . ketoconazole (NIZORAL) 2 % cream         . lisinopril (PRINIVIL,ZESTRIL) 5 MG tablet Take 1 tablet (5 mg total) by mouth 2 (two) times daily.  180 tablet  3  . loratadine (CLARITIN) 10 MG tablet Take 10 mg by mouth daily.      . Multiple Vitamin (MULTIVITAMIN) tablet Take 1 tablet by mouth daily.        . mupirocin ointment (BACTROBAN) 2 %       . nitroGLYCERIN (NITROSTAT) 0.4 MG SL tablet Place 1 tablet (0.4 mg total) under the tongue every 5 (five) minutes as needed. For chest pain  100 tablet  prn  . omeprazole (PRILOSEC) 20 MG capsule Take 1 capsule (20 mg total) by mouth 2 (two) times daily.  180 capsule  3  . polyethylene glycol (MIRALAX / GLYCOLAX) packet Take 13 g by mouth daily.      [name] Kitchen warfarin (COUMADIN) 5 MG tablet Take 1 tablet (5 mg total) by mouth at bedtime.  90 tablet  2   No current facility-administered medications for this visit.    Allergies  Allergen Reactions  . Sulfa Drugs Cross Reactors Other (See Comments)    unknown  . Ultram [Tramadol Hcl] Itching    "don't remember how bad"  . Xarelto [Rivaroxaban] Other (See Comments)    Itching   . Zithromax [Azithromycin]     Patient Active Problem List   Diagnosis Date Noted  . Cough, persistent 11/15/2010    Priority: High  . Hx of CABG 09/23/2010    Priority: High  . Hypertension     Priority: High  . Anxiety     Priority: High  . Hyperlipidemia     Priority: Medium  . Chest pain 04/12/2013  . Midsternal chest pain 03/27/2013  . Ventral hernia 01/23/2013  . Angina pectoris 08/30/2012  . Chronic systolic CHF (congestive heart failure), NYHA class 2   . Chronic anticoagulation   . Atypical chest pain 03/11/2012  . Umbilical hernia 38/01/1750  . CAD (coronary artery disease) 10/20/2011  . Atrial fibrillation-Permanent 05/31/2011  . 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 Pacemaker-stJudes 05/31/2011  . Chest pain 04/11/2011  . Ischemic cardiomyopathy   . GERD (gastroesophageal reflux disease)   . Fibromyalgia     History  Smoking  status  . Former Smoker -- 0.50 packs/day for 10 years  . Types: Cigarettes  . Quit date: 09/21/1973  Smokeless tobacco  . Never Used    History  Alcohol Use No    Family History  Problem Relation Age of Onset  . Stroke Mother   . Heart attack Father     died @ 79, mult MI's.    Review of Systems: Constitutional: no fever chills diaphoresis or fatigue or change in weight.  Head and neck: no hearing loss, no epistaxis, no photophobia or visual disturbance. Respiratory: No cough, shortness of breath or wheezing.  Cardiovascular: No chest pain peripheral edema, palpitations. Gastrointestinal: No abdominal distention, no abdominal pain, no change in bowel habits hematochezia or melena. Genitourinary: No dysuria, no frequency, no urgency, no nocturia. Musculoskeletal:No arthralgias, no back pain, no gait disturbance or myalgias. Neurological: No dizziness, no headaches, no numbness, no seizures, no syncope, no weakness, no tremors. Hematologic: No lymphadenopathy, no easy bruising. Psychiatric: No confusion, no hallucinations, no sleep disturbance.    Physical Exam: Filed Vitals:   12/06/13 1450  BP: 120/72  Pulse: 60   the general appearance reveals a well-developed well-nourished somewhat anxious gentleman in no acute distress.The head and neck exam reveals pupils equal and reactive.  Extraocular movements are full.  There is no scleral icterus.  The mouth and pharynx are normal.  The neck is supple.  The carotids reveal no bruits.  The jugular venous pressure is normal.  The  thyroid is not enlarged.  There is no lymphadenopathy.  The chest is clear to percussion and auscultation.  There are no rales or rhonchi.  Expansion of the chest is symmetrical.  The precordium is quiet.  The first heart sound is normal.  The second heart sound is physiologically split.  There is a grade 2/6 systolic ejection murmur at the base. There is no abnormal lift or heave.  The abdomen is soft and  nontender.  The bowel sounds are normal.  The liver and spleen are not enlarged.  There are no abdominal masses.  There are no abdominal bruits.  Extremities reveal good pedal pulses.  There is no phlebitis or edema.  There is no cyanosis or clubbing.  Strength is normal and symmetrical in all extremities.  There is no lateralizing weakness.  There are no sensory deficits.  The skin is warm and dry.  There is no rash.  There is a infected tick bite on the right posterior thorax.     Assessment / Plan: 1.  Ischemic heart disease status post CABG remotely and status post redo CABG in 2007. 2. Hyperlipidemia 3. hypertensive heart disease without heart failure 4. Anxiety 5. permanent atrial fibrillation with functioning ventricular pacemaker. 6. Dyspnea 7. systolic heart murmur at aortic area  Plan: Get chest x-ray to evaluate dyspnea.  Also look for heart size.  We will also get an echocardiogram to see if his ejection fraction has improved and he over the past year and a half.  We'll also evaluate his heart murmur. Continue same medication.  Continue full activity.  We'll plan to see him his regular visit in September previously scheduled.

## 2013-12-13 ENCOUNTER — Other Ambulatory Visit: Payer: Self-pay | Admitting: *Deleted

## 2013-12-13 ENCOUNTER — Ambulatory Visit (HOSPITAL_COMMUNITY): Payer: Medicare Other | Attending: Cardiology | Admitting: Radiology

## 2013-12-13 DIAGNOSIS — R0600 Dyspnea, unspecified: Secondary | ICD-10-CM

## 2013-12-13 DIAGNOSIS — I2589 Other forms of chronic ischemic heart disease: Secondary | ICD-10-CM | POA: Diagnosis present

## 2013-12-13 DIAGNOSIS — I4891 Unspecified atrial fibrillation: Secondary | ICD-10-CM | POA: Insufficient documentation

## 2013-12-13 NOTE — Progress Notes (Signed)
Echocardiogram performed.  

## 2013-12-16 ENCOUNTER — Telehealth: Payer: Self-pay | Admitting: Cardiology

## 2013-12-16 ENCOUNTER — Other Ambulatory Visit: Payer: Self-pay

## 2013-12-16 DIAGNOSIS — I119 Hypertensive heart disease without heart failure: Secondary | ICD-10-CM

## 2013-12-16 DIAGNOSIS — I480 Paroxysmal atrial fibrillation: Secondary | ICD-10-CM

## 2013-12-16 MED ORDER — CARVEDILOL 12.5 MG PO TABS: 12.5000 mg | ORAL_TABLET | Freq: Two times a day (BID) | ORAL | Status: DC

## 2013-12-16 NOTE — Telephone Encounter (Signed)
°  °  Patient has questions about medication. And he wants to make sure its okay for him to go alone to the mountains. Please call and advise.

## 2013-12-16 NOTE — Telephone Encounter (Addendum)
Went over medications again with pt. Pt wants to make sure he can go to the mountains alone and at least go up two flights of stairs if needed. Pt wants to know if he has any restrictions?

## 2013-12-16 NOTE — Telephone Encounter (Signed)
Message copied by Alcario Drought on Mon Dec 16, 2013  8:42 AM ------      Message from: Darlin Coco      Created: Mon Dec 16, 2013  7:52 AM       Please report.  The echo shows slight improvement in his ejection fraction.  It went from 25% up to 30%.  I would like to try to get it up higher by increasing his carvedilol up to 12.5 mg twice a day. ------

## 2013-12-16 NOTE — Telephone Encounter (Signed)
Yes okay for him to go to the mountains by himself.  Okay to climb 2 flights of stairs. No restrictions.

## 2013-12-16 NOTE — Telephone Encounter (Signed)
Pt notified, meds updated.

## 2013-12-17 ENCOUNTER — Telehealth: Payer: Self-pay | Admitting: *Deleted

## 2013-12-17 NOTE — Telephone Encounter (Signed)
Patient called about the change in dose on his carvedilol. He stated that his bp today was in the 90's over 60's range both times that he checked it. He wants to know should he continue this dose since his bp is so low. Also, he would like to know if walking will help his ef. Please advise. Thanks, MI

## 2013-12-17 NOTE — Telephone Encounter (Signed)
Patient aware to go back to previous dose and he will continue checking his bp. He stated that he is going to get back into his routine of walking a mile every morning to help his ef. He also wants Dr Mare Ferrari to know that he is not going to the mountains as he had planned. He will wait until his bp comes back up to normal.

## 2013-12-17 NOTE — Telephone Encounter (Signed)
With his blood pressure that low he should go back to a smaller dose of carvedilol, just 6.25 mg twice a day.  Walking will be good for his ejection fraction and his heart.

## 2013-12-17 NOTE — Telephone Encounter (Signed)
Pt.notified

## 2013-12-25 ENCOUNTER — Other Ambulatory Visit: Payer: Self-pay | Admitting: *Deleted

## 2013-12-25 MED ORDER — CARVEDILOL 6.25 MG PO TABS: 6.2500 mg | ORAL_TABLET | Freq: Two times a day (BID) | ORAL | Status: DC

## 2013-12-31 ENCOUNTER — Ambulatory Visit (INDEPENDENT_AMBULATORY_CARE_PROVIDER_SITE_OTHER): Payer: Medicare Other | Admitting: Pharmacist Clinician (PhC)/ Clinical Pharmacy Specialist

## 2013-12-31 DIAGNOSIS — I4891 Unspecified atrial fibrillation: Secondary | ICD-10-CM

## 2013-12-31 LAB — POCT INR: INR: 2.7

## 2014-01-08 ENCOUNTER — Telehealth: Payer: Self-pay | Admitting: Internal Medicine

## 2014-01-08 NOTE — Telephone Encounter (Signed)
New Message  Pt called. Requests a call back to confrim transmission was received. Please call

## 2014-01-08 NOTE — Telephone Encounter (Signed)
Informed pt that we did not receive transmission.

## 2014-01-09 ENCOUNTER — Telehealth: Payer: Self-pay | Admitting: Family Medicine

## 2014-01-09 NOTE — Telephone Encounter (Signed)
He was using treadmill and his hip starting hurting.  Advised to get family to print out info on exercise and good range of motion therapy. Hot and cold therapy.

## 2014-01-10 ENCOUNTER — Other Ambulatory Visit (HOSPITAL_COMMUNITY): Payer: Self-pay | Admitting: Cardiology

## 2014-01-10 ENCOUNTER — Encounter: Payer: Self-pay | Admitting: Cardiology

## 2014-01-10 ENCOUNTER — Ambulatory Visit (INDEPENDENT_AMBULATORY_CARE_PROVIDER_SITE_OTHER): Payer: Medicare Other | Admitting: Cardiology

## 2014-01-10 VITALS — BP 110/70 | HR 64 | Ht 71.0 in | Wt 182.4 lb

## 2014-01-10 DIAGNOSIS — E785 Hyperlipidemia, unspecified: Secondary | ICD-10-CM

## 2014-01-10 DIAGNOSIS — I739 Peripheral vascular disease, unspecified: Secondary | ICD-10-CM

## 2014-01-10 DIAGNOSIS — I480 Paroxysmal atrial fibrillation: Secondary | ICD-10-CM

## 2014-01-10 DIAGNOSIS — I4819 Other persistent atrial fibrillation: Secondary | ICD-10-CM

## 2014-01-10 DIAGNOSIS — I4891 Unspecified atrial fibrillation: Secondary | ICD-10-CM

## 2014-01-10 DIAGNOSIS — I119 Hypertensive heart disease without heart failure: Secondary | ICD-10-CM

## 2014-01-10 DIAGNOSIS — I209 Angina pectoris, unspecified: Secondary | ICD-10-CM

## 2014-01-10 DIAGNOSIS — I259 Chronic ischemic heart disease, unspecified: Secondary | ICD-10-CM

## 2014-01-10 DIAGNOSIS — I70219 Atherosclerosis of native arteries of extremities with intermittent claudication, unspecified extremity: Secondary | ICD-10-CM

## 2014-01-10 LAB — HEPATIC FUNCTION PANEL
ALT: 20 U/L (ref 0–53)
AST: 19 U/L (ref 0–37)
Albumin: 4.1 g/dL (ref 3.5–5.2)
Alkaline Phosphatase: 65 U/L (ref 39–117)
BILIRUBIN TOTAL: 1.6 mg/dL — AB (ref 0.2–1.2)
Bilirubin, Direct: 0.3 mg/dL (ref 0.0–0.3)
Total Protein: 6.8 g/dL (ref 6.0–8.3)

## 2014-01-10 LAB — BASIC METABOLIC PANEL
BUN: 14 mg/dL (ref 6–23)
CHLORIDE: 93 meq/L — AB (ref 96–112)
CO2: 30 mEq/L (ref 19–32)
Calcium: 8.9 mg/dL (ref 8.4–10.5)
Creatinine, Ser: 1 mg/dL (ref 0.4–1.5)
GFR: 74.33 mL/min (ref >60.00)
Glucose, Bld: 79 mg/dL (ref 70–99)
Potassium: 4.4 mEq/L (ref 3.5–5.1)
SODIUM: 129 meq/L — AB (ref 135–145)

## 2014-01-10 LAB — LIPID PANEL
CHOLESTEROL: 115 mg/dL (ref 0–200)
HDL: 40.8 mg/dL (ref >39.00)
LDL CALC: 61 mg/dL (ref 0–99)
NonHDL: 74.2
Total CHOL/HDL Ratio: 3
Triglycerides: 65 mg/dL (ref 0.0–149.0)
VLDL: 13 mg/dL (ref 0.0–40.0)

## 2014-01-10 NOTE — Progress Notes (Signed)
[name] Date of Birth:  [birth year] [place] [address] [place], [place]  [address] [phone]        Fax   [phone]   History of Present Illness: This pleasant 73 year old gentleman is seen for a scheduled followup office visit.  He has a history of significant ischemic heart disease.[name] Kitchen He was admitted to [facility] on 03/09/12 and discharged on 03/11/12. He was admitted after he complained of having some chest discomfort at the time of a routine pacemaker check up. He was found to be in atrial fibrillation at the time of admission. On the date of discharge 11//13 the patient underwent a Lexus scan Myoview stress test which revealed left ventricular ejection fraction 32% with septal and inferior wall motion abnormalities and with an inferior lateral scar but no evidence of reversible ischemia. His prior ejection fraction at the time of catheter in 2012 was approximately 40%. 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 His last cardiac catheterization in October 2012 showed that his grafts were patent. The patient also has a history of hypercholesterolemia and essential hypertension. He has had a prior history of paroxysmal atrial flutter with ablation therapy and subsequent heart block which required a [employer]. Jude pacemaker which was implanted in 2012. The patient is on long-term Coumadin. He has a lot of anxiety. On 05/25/12 the patient underwent elective cardioversion which was successful. Normal sinus rhythm and was returned as was confirmed by intracardiac electrogram via his pacemaker. However, within 24 hours after the cardioversion he felt that his pulse was irregular again. EKG confirmed that he was back in atrial flutter fibrillation. He was essentially asymptomatic and states that the arrhythmia doesn't really bother him much. Strategy is rate control and long-term anticoagulation. He is intolerant of Xarelto because of itching  and is back on long-term Coumadin. The patient was readmitted to the hospital in November 2014 for chest pain. He underwent another nuclear stress test on 03/27/13. This showed an ejection fraction of 33% and showed an inferior wall scar but no reversible ischemia.  Since last visit he has had occasional chest discomfort.   Recently the patient has been aware of some discomfort in the gluteal areas when he walks.  This raises the question of possible claudication.  Previous abdominal CT scan in April 2015 showed extensive atherosclerotic involvement of the aorta and iliac branches.  Current Outpatient Prescriptions  Medication Sig Dispense Refill  . acetaminophen (TYLENOL) 500 MG tablet Take 500 mg by mouth every 8 (eight) hours as needed.      . ALPRAZolam (XANAX) 1 MG tablet Take 1 tablet (1 mg total) by mouth 3 (three) times daily as needed. For anxiety  270 tablet  1  . atorvastatin (LIPITOR) 40 MG tablet Take 1 tablet (40 mg total) by mouth as directed. 1 TABLET ALTERNATING WITH 1/2 TABLET  70 tablet  3  . carvedilol (COREG) 6.25 MG tablet Take 1 tablet (6.25 mg total) by mouth 2 (two) times daily.  180 tablet  3  . cyclobenzaprine (FLEXERIL) 5 MG tablet Take 1 tablet (5 mg total) by mouth 3 (three) times daily as needed for muscle spasms.  30 tablet  0  . doxepin (SINEQUAN) 25 MG capsule 1 capsule every morning and 2 capsules at night  270 capsule  3  . fluconazole (DIFLUCAN) 150 MG tablet Take 1 tablet (150 mg total) by mouth once.  2 tablet  1  . furosemide (LASIX) 20 MG tablet       . gabapentin (NEURONTIN) 100 MG capsule Take 1 capsule (100 mg total) by mouth 4 (four) times daily.  360 capsule  3  . HYDROcodone-acetaminophen (NORCO) 5-325 MG per tablet Take 1 tablet by mouth every 6 (six) hours as needed for moderate pain.  30 tablet  0  . hydroxypropyl methylcellulose (ISOPTO TEARS) 2.5 % ophthalmic solution Place 1 drop into both eyes 4 (four) times daily as needed. Dry eyes      .  isosorbide mononitrate (IMDUR) 60 MG 24 hr tablet Take 1 tablet (60 mg total) by mouth daily.  90 tablet  3  . ketoconazole (NIZORAL) 2 % cream       . lisinopril (PRINIVIL,ZESTRIL) 5 MG tablet Take 1 tablet (5 mg total) by mouth 2 (two) times daily.  180 tablet  3  . loratadine (CLARITIN) 10 MG tablet Take 10 mg by mouth daily.      . Multiple Vitamin (MULTIVITAMIN) tablet Take 1 tablet by mouth daily.        . mupirocin ointment (BACTROBAN) 2 %       . nitroGLYCERIN (NITROSTAT) 0.4 MG SL tablet Place 1 tablet (0.4 mg total) under the tongue every 5 (five) minutes as needed. For chest pain  100 tablet  prn  . omeprazole (PRILOSEC) 20 MG capsule Take 1 capsule (20 mg total) by mouth 2 (two) times daily.  180 capsule  3  . polyethylene glycol (MIRALAX / GLYCOLAX) packet Take 13 g by mouth daily.      [name] Kitchen warfarin (COUMADIN) 5 MG tablet Take 1 tablet (5 mg total) by mouth at bedtime.  90 tablet  2   No current facility-administered medications for this visit.    Allergies  Allergen Reactions  . Sulfa Drugs Cross Reactors Other (See Comments)    unknown  . Ultram [Tramadol Hcl] Itching    "don't remember how bad"  . Xarelto [Rivaroxaban] Other (See Comments)    Itching   . Zithromax [Azithromycin]     Patient Active Problem List   Diagnosis Date Noted  . Cough, persistent 11/15/2010    Priority: High  . Hx of CABG 09/23/2010    Priority: High  . Hypertension     Priority: High  . Anxiety     Priority: High  . Hyperlipidemia     Priority: Medium  . 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 Chest pain 04/12/2013  . Midsternal chest pain 03/27/2013  . Ventral hernia 01/23/2013  . Angina pectoris 08/30/2012  . Chronic systolic CHF (congestive heart failure), NYHA class 2   . Chronic anticoagulation   . Atypical chest pain 03/11/2012  . Umbilical hernia 14/43/1540  . CAD (coronary artery disease) 10/20/2011  . Atrial fibrillation-Permanent 05/31/2011  . Complete heart  block-intermittent 05/31/2011  . Pacemaker-stJudes 05/31/2011  . Chest pain 04/11/2011  . Ischemic cardiomyopathy   . GERD (gastroesophageal reflux disease)   . Fibromyalgia     History  Smoking status  . Former Smoker -- 0.50 packs/day for 10 years  . Types: Cigarettes  . Quit date: 09/21/1973  Smokeless tobacco  . Never Used    History  Alcohol Use No    Family History  Problem Relation Age of Onset  . Stroke Mother   . Heart attack Father     died @ 45, mult MI's.    Review of Systems: Constitutional: no fever chills diaphoresis or fatigue or change  in weight.  Head and neck: no hearing loss, no epistaxis, no photophobia or visual disturbance. Respiratory: No cough, shortness of breath or wheezing. Cardiovascular: No chest pain peripheral edema, palpitations. Gastrointestinal: No abdominal distention, no abdominal pain, no change in bowel habits hematochezia or melena. Genitourinary: No dysuria, no frequency, no urgency, no nocturia. Musculoskeletal:No arthralgias, no back pain, no gait disturbance or myalgias. Neurological: No dizziness, no headaches, no numbness, no seizures, no syncope, no weakness, no tremors. Hematologic: No lymphadenopathy, no easy bruising. Psychiatric: No confusion, no hallucinations, no sleep disturbance.    Physical Exam: Filed Vitals:   01/10/14 1131  BP: 110/70  Pulse: 64   the general appearance reveals a well-developed well-nourished somewhat anxious gentleman in no acute distress.The head and neck exam reveals pupils equal and reactive.  Extraocular movements are full.  There is no scleral icterus.  The mouth and pharynx are normal.  The neck is supple.  The carotids reveal no bruits.  The jugular venous pressure is normal.  The  thyroid is not enlarged.  There is no lymphadenopathy.  The chest is clear to percussion and auscultation.  There are no rales or rhonchi.  Expansion of the chest is symmetrical.  The precordium is quiet.  The  first heart sound is normal.  The second heart sound is physiologically split.  There is a grade 2/6 systolic ejection murmur at the base. There is no abnormal lift or heave.  The abdomen is soft and nontender.  The bowel sounds are normal.  The liver and spleen are not enlarged.  There are no abdominal masses.  There are no abdominal bruits.  Extremities reveal no femoral bruits.  Femoral pulses are weak.  Pedal pulses are equivocal.  There is no phlebitis or edema.  There is no cyanosis or clubbing.  Strength is normal and symmetrical in all extremities.  There is no lateralizing weakness.  There are no sensory deficits.  The skin is warm and dry.  There is no rash.  There is a infected tick bite on the right posterior thorax.     Assessment / Plan: 1.  Ischemic heart disease status post CABG remotely and status post redo CABG in 2007. 2. Hyperlipidemia 3. hypertensive heart disease without heart failure 4. Anxiety 5. permanent atrial fibrillation with functioning ventricular pacemaker. 6. Dyspnea.  Most recent ejection fraction by echo on 12/13/13 is 30%, improved from 37% 7. systolic heart murmur at aortic area.  Echo shows aortic valve sclerosis and mild mitral regurgitation 8.  Possible gluteal claudication.  Previous CT of abdomen shows extensive atherosclerotic deposition of aorta and iliacs.  Plan: Continue current medication.  We will get arterial Dopplers of his legs to evaluate possible claudication. Lab work today pending.  Recheck in 3 months for office visit and basal metabolic panel.

## 2014-01-10 NOTE — Assessment & Plan Note (Signed)
The patient has not been experiencing any recurrent chest pain or angina.  He has not been using his treadmill regularly however because of concern over his hip pain with exertion

## 2014-01-10 NOTE — Assessment & Plan Note (Signed)
He remains on long-term warfarin for his atrial fibrillation.  He has not been having any TIA symptoms.

## 2014-01-10 NOTE — Assessment & Plan Note (Signed)
The patient has not atorvastatin 40 mg daily for his hypercholesterolemia.  He is not having any myalgias from his statin therapy.

## 2014-01-10 NOTE — Patient Instructions (Signed)
Your physician recommends that you continue on your current medications as directed. Please refer to the Current Medication list given to you today.  Your physician recommends that you schedule a follow-up appointment in: 3 month ov/bmet  Your physician has requested that you have a lower or upper extremity arterial duplex. This test is an ultrasound of the arteries in the legs or arms. It looks at arterial blood flow in the legs and arms. Allow one hour for Lower and Upper Arterial scans. There are no restrictions or special instructions

## 2014-01-11 NOTE — Progress Notes (Signed)
Quick Note:  Please report to patient. The recent labs are stable. Continue same medication and careful diet. ______ 

## 2014-01-14 ENCOUNTER — Ambulatory Visit (HOSPITAL_COMMUNITY): Payer: Medicare Other | Attending: Cardiology | Admitting: Cardiology

## 2014-01-14 DIAGNOSIS — I10 Essential (primary) hypertension: Secondary | ICD-10-CM | POA: Diagnosis not present

## 2014-01-14 DIAGNOSIS — Z951 Presence of aortocoronary bypass graft: Secondary | ICD-10-CM | POA: Diagnosis not present

## 2014-01-14 DIAGNOSIS — Z87891 Personal history of nicotine dependence: Secondary | ICD-10-CM | POA: Insufficient documentation

## 2014-01-14 DIAGNOSIS — I70219 Atherosclerosis of native arteries of extremities with intermittent claudication, unspecified extremity: Secondary | ICD-10-CM | POA: Diagnosis not present

## 2014-01-14 DIAGNOSIS — I739 Peripheral vascular disease, unspecified: Secondary | ICD-10-CM

## 2014-01-14 DIAGNOSIS — I2510 Atherosclerotic heart disease of native coronary artery without angina pectoris: Secondary | ICD-10-CM | POA: Diagnosis not present

## 2014-01-14 DIAGNOSIS — E785 Hyperlipidemia, unspecified: Secondary | ICD-10-CM | POA: Diagnosis not present

## 2014-01-14 NOTE — Progress Notes (Signed)
LEA Doppler + Duplex performed 

## 2014-01-16 ENCOUNTER — Other Ambulatory Visit: Payer: Self-pay | Admitting: *Deleted

## 2014-01-16 DIAGNOSIS — F419 Anxiety disorder, unspecified: Secondary | ICD-10-CM

## 2014-01-16 MED ORDER — DOXEPIN HCL 25 MG PO CAPS: ORAL_CAPSULE | ORAL | Status: DC

## 2014-01-16 MED ORDER — ALPRAZOLAM 1 MG PO TABS: 1.0000 mg | ORAL_TABLET | Freq: Three times a day (TID) | ORAL | Status: DC | PRN

## 2014-01-16 NOTE — Telephone Encounter (Signed)
Faxed to [facility] as requested

## 2014-01-27 ENCOUNTER — Ambulatory Visit (INDEPENDENT_AMBULATORY_CARE_PROVIDER_SITE_OTHER): Payer: Medicare Other

## 2014-01-27 DIAGNOSIS — Z23 Encounter for immunization: Secondary | ICD-10-CM

## 2014-02-14 ENCOUNTER — Telehealth: Payer: Self-pay | Admitting: Cardiology

## 2014-02-14 NOTE — Telephone Encounter (Signed)
New message    Patient calling regarding been set up for xray.

## 2014-02-14 NOTE — Telephone Encounter (Signed)
I suggest that he see Dr. Gladstone Lighter or his orthopedist of choice to evaluate further and they will do the proper xray for his condition.

## 2014-02-14 NOTE — Telephone Encounter (Signed)
Patient still having trouble with hip/leg pain. He called PCP and they can not see him until November.  Patient requesting  Dr. Mare Ferrari order Xray. Will forward to  Dr. Mare Ferrari for recommendations.

## 2014-02-15 ENCOUNTER — Emergency Department (HOSPITAL_COMMUNITY)
Admission: EM | Admit: 2014-02-15 | Discharge: 2014-02-15 | Disposition: A | Discharge status: Home / Self Care | Payer: Medicare Other | Attending: Emergency Medicine | Admitting: Emergency Medicine

## 2014-02-15 ENCOUNTER — Emergency Department (HOSPITAL_COMMUNITY): Payer: Medicare Other

## 2014-02-15 ENCOUNTER — Encounter (HOSPITAL_COMMUNITY): Payer: Self-pay | Admitting: Emergency Medicine

## 2014-02-15 DIAGNOSIS — R0789 Other chest pain: Secondary | ICD-10-CM | POA: Diagnosis not present

## 2014-02-15 DIAGNOSIS — I10 Essential (primary) hypertension: Secondary | ICD-10-CM | POA: Diagnosis not present

## 2014-02-15 DIAGNOSIS — E785 Hyperlipidemia, unspecified: Secondary | ICD-10-CM | POA: Diagnosis not present

## 2014-02-15 DIAGNOSIS — I5022 Chronic systolic (congestive) heart failure: Secondary | ICD-10-CM | POA: Diagnosis not present

## 2014-02-15 DIAGNOSIS — Z951 Presence of aortocoronary bypass graft: Secondary | ICD-10-CM | POA: Diagnosis not present

## 2014-02-15 DIAGNOSIS — I4891 Unspecified atrial fibrillation: Secondary | ICD-10-CM | POA: Insufficient documentation

## 2014-02-15 DIAGNOSIS — Z79899 Other long term (current) drug therapy: Secondary | ICD-10-CM | POA: Diagnosis not present

## 2014-02-15 DIAGNOSIS — Z87448 Personal history of other diseases of urinary system: Secondary | ICD-10-CM | POA: Diagnosis not present

## 2014-02-15 DIAGNOSIS — Z7901 Long term (current) use of anticoagulants: Secondary | ICD-10-CM | POA: Insufficient documentation

## 2014-02-15 DIAGNOSIS — F419 Anxiety disorder, unspecified: Secondary | ICD-10-CM | POA: Diagnosis not present

## 2014-02-15 DIAGNOSIS — K219 Gastro-esophageal reflux disease without esophagitis: Secondary | ICD-10-CM | POA: Diagnosis not present

## 2014-02-15 DIAGNOSIS — R079 Chest pain, unspecified: Secondary | ICD-10-CM

## 2014-02-15 DIAGNOSIS — F329 Major depressive disorder, single episode, unspecified: Secondary | ICD-10-CM | POA: Diagnosis not present

## 2014-02-15 DIAGNOSIS — Z87891 Personal history of nicotine dependence: Secondary | ICD-10-CM | POA: Insufficient documentation

## 2014-02-15 DIAGNOSIS — Z9889 Other specified postprocedural states: Secondary | ICD-10-CM | POA: Insufficient documentation

## 2014-02-15 DIAGNOSIS — I2510 Atherosclerotic heart disease of native coronary artery without angina pectoris: Secondary | ICD-10-CM | POA: Diagnosis not present

## 2014-02-15 DIAGNOSIS — R0602 Shortness of breath: Secondary | ICD-10-CM | POA: Insufficient documentation

## 2014-02-15 DIAGNOSIS — R110 Nausea: Secondary | ICD-10-CM | POA: Diagnosis not present

## 2014-02-15 DIAGNOSIS — Z85828 Personal history of other malignant neoplasm of skin: Secondary | ICD-10-CM | POA: Insufficient documentation

## 2014-02-15 LAB — CBC
HCT: 41.1 % (ref 39.0–52.0)
Hemoglobin: 13.9 g/dL (ref 13.0–17.0)
MCH: 30.3 pg (ref 26.0–34.0)
MCHC: 33.8 g/dL (ref 30.0–36.0)
MCV: 89.5 fL (ref 78.0–100.0)
Platelets: 187 10*3/uL (ref 150–400)
RBC: 4.59 MIL/uL (ref 4.22–5.81)
RDW: 13.7 % (ref 11.5–15.5)
WBC: 6.6 10*3/uL (ref 4.0–10.5)

## 2014-02-15 LAB — BASIC METABOLIC PANEL
Anion gap: 10 (ref 5–15)
BUN: 15 mg/dL (ref 6–23)
CO2: 27 mEq/L (ref 19–32)
Calcium: 9.1 mg/dL (ref 8.4–10.5)
Chloride: 89 mEq/L — ABNORMAL LOW (ref 96–112)
Creatinine, Ser: 1.01 mg/dL (ref 0.50–1.35)
GFR, EST AFRICAN AMERICAN: 83 mL/min — AB (ref >90)
GFR, EST NON AFRICAN AMERICAN: 72 mL/min — AB (ref >90)
Glucose, Bld: 95 mg/dL (ref 70–99)
POTASSIUM: 5.5 meq/L — AB (ref 3.7–5.3)
Sodium: 126 mEq/L — ABNORMAL LOW (ref 137–147)

## 2014-02-15 LAB — PRO B NATRIURETIC PEPTIDE: Pro B Natriuretic peptide (BNP): 788.4 pg/mL — ABNORMAL HIGH (ref 0–125)

## 2014-02-15 LAB — PROTIME-INR
INR: 2.16 — AB (ref 0.00–1.49)
Prothrombin Time: 24.3 seconds — ABNORMAL HIGH (ref 11.6–15.2)

## 2014-02-15 LAB — I-STAT TROPONIN, ED: Troponin i, poc: 0.02 ng/mL (ref 0.00–0.08)

## 2014-02-15 LAB — TROPONIN I

## 2014-02-15 MED ORDER — NITROGLYCERIN 2 % TD OINT
0.5000 [in_us] | TOPICAL_OINTMENT | TRANSDERMAL | Status: AC
  Administered 2014-02-15: 0.5 [in_us] via TOPICAL
  Filled 2014-02-15: qty 1

## 2014-02-15 MED ORDER — GI COCKTAIL ~~LOC~~
30.0000 mL | Freq: Once | ORAL | Status: AC
  Administered 2014-02-15: 30 mL via ORAL
  Filled 2014-02-15: qty 30

## 2014-02-15 MED ORDER — OXYCODONE-ACETAMINOPHEN 5-325 MG PO TABS
1.0000 | ORAL_TABLET | Freq: Four times a day (QID) | ORAL | Status: DC | PRN
Start: 2014-02-15 — End: 2014-05-29

## 2014-02-15 MED ORDER — ONDANSETRON HCL 4 MG/2ML IJ SOLN
4.0000 mg | Freq: Once | INTRAMUSCULAR | Status: AC
  Administered 2014-02-15: 4 mg via INTRAVENOUS
  Filled 2014-02-15: qty 2

## 2014-02-15 MED ORDER — FENTANYL CITRATE 0.05 MG/ML IJ SOLN
50.0000 ug | Freq: Once | INTRAMUSCULAR | Status: AC
  Administered 2014-02-15: 50 ug via INTRAVENOUS
  Filled 2014-02-15: qty 2

## 2014-02-15 NOTE — ED Notes (Signed)
Brought pt back to room; pt undressed, in gown, on monitor, continuous pulse oximetry and blood pressure cuff; 2 warm blankets given Tiffany, RN present in room

## 2014-02-15 NOTE — ED Notes (Signed)
Pt reports two episodes of central chest pressure with radiation to right arm that started today. Reports first episode at noon and lasted appx 1 hour. States he took 3 nitro which relieved pain. Pt reports this episode started at 1500 but wasn't relieved with 3 nitro; rates current pain 10/10. Reports mild SOB, dizziness, lightheadedness, nausea and weakness. Hx: 2 CABG Cardiologist: Dr. Mare Ferrari

## 2014-02-15 NOTE — ED Notes (Signed)
Pt reports central pressure with radiation to rt arm. Pt states he took nitro around 12 for cp and had relief but the 2nd time he had no relief. Pt rates pain 10/10. States he nausea, lightheadedness, dizziness and sob with cp. Pt called cardiologist and was advised to come here.

## 2014-02-15 NOTE — ED Provider Notes (Signed)
CSN: [id number]     Arrival date & time 02/15/14  1735 History   First MD Initiated Contact with Patient 02/15/14 1817     Chief Complaint  Patient presents with  . Chest Pain     (Consider location/radiation/quality/duration/timing/severity/associated sxs/prior Treatment) Patient is a 73 y.o. male presenting with chest pain. The history is provided by the patient.  Chest Pain Pain location:  Substernal area Pain quality: pressure   Pain radiates to:  Does not radiate Pain radiates to the back: no   Pain severity:  Moderate Onset quality:  Sudden Timing:  Intermittent Progression:  Unchanged Chronicity:  Recurrent Context: at rest   Relieved by:  Nothing Worsened by:  Nothing tried Ineffective treatments:  Aspirin, nitroglycerin and antacids Associated symptoms: nausea and shortness of breath   Associated symptoms: no abdominal pain, no cough, no fever, no headache, no numbness and not vomiting     Past Medical History  Diagnosis Date  . Ischemic cardiomyopathy     a. 06/2012 EF 25% by echo.  . Hypertension   . Hyperlipidemia   . GERD (gastroesophageal reflux disease)   . CAD (coronary artery disease)     a. 1991 s/p CABGx4;  b. redo CABG in 2007 (SVG->OM, VG->PDA->PLV);  c. 01/2011 Cath: LM 80ost, LAD 100p, LCX 100ost, OM1 100, RCA 100, LIMA->LAD ok, VG->PDA ok, VG->OM ok;  d. 03/2012 MV: EF 32% inf & inflat scar w/o ischemia; e. Lexi MV: EF 33%, inf infarct, no ischemia.  . Atrial flutter     a. 01/2011 s/p unsuccessful RFCA complicated by CHB req PPM.  . CHB (complete heart block)     a. 01/2011: in setting of RFCA, s/p SJM 2110 [facility] DC PPM, ser # [id number].  [name] Kitchen Kidney cysts     a. bilateral  . Hiatal hernia     a. 03/01/2013 EGD and esoph dil.  [name] Kitchen BPH (benign prostatic hyperplasia)     a. elevated PSA  . A-fib     a. chronic coumadin  . Elevated PSA, less than 10 ng/ml   . Fibromyalgia   . Skin cancer     'above left eyebrow; tip of my nose" (08/29/2012)  .  Depression     "since 1986-1987" (08/29/2012)  . Anxiety   . Panic attacks   . Chronic systolic CHF (congestive heart failure), NYHA class 2     a. 06/2012 Echo: EF 25%, distal sept and apical AK, sev dil LV, mild LVH, mod dil LA.   Past Surgical History  Procedure Laterality Date  . Coronary artery bypass graft  1991    CABG X 5  . Coronary artery bypass graft  06/2005    CABG X 3  . Cardiac catheterization  10/30/2009    Grafts patent. Mild to moderate LV dysfunction. EF 45%. Managed medically.   . Cardiac catheterization      multiple  . Tonsillectomy  1960  . Cholecystectomy  2007  . Insert / replace / remove pacemaker  01/2011    initial placement  . Inguinal hernia repair  1960's    "left I think"  . Cataract extraction w/ intraocular lens  implant, bilateral  2012  . Cardioversion  05/25/2012    Procedure: CARDIOVERSION;  Surgeon: Darlin Coco, MD;  Location: [facility] ENDOSCOPY;  Service: Cardiovascular;  Laterality: N/A;  . Humerus surgery Left 1960    "growth on the bone taken off; not cancer" (08/29/2012)  . Excisional hemorrhoidectomy  ~ 1980  . Skin cancer  excision      'above left eyebrow; tip of my nose" (08/29/2012)  . Atrial flutter ablation  01/2011    "didn't work" (08/29/2012)   Family History  Problem Relation Age of Onset  . Stroke Mother   . Heart attack Father     died @ 10, mult MI's.   History  Substance Use Topics  . Smoking status: Former Smoker -- 0.50 packs/day for 10 years    Types: Cigarettes    Quit date: 09/21/1973  . Smokeless tobacco: Never Used  . Alcohol Use: No    Review of Systems  Constitutional: Negative for fever.  HENT: Negative for drooling and rhinorrhea.   Eyes: Negative for pain.  Respiratory: Positive for shortness of breath. Negative for cough.   Cardiovascular: Positive for chest pain. Negative for leg swelling.  Gastrointestinal: Positive for nausea. Negative for vomiting, abdominal pain and diarrhea.  Genitourinary:  Negative for dysuria and hematuria.  Musculoskeletal: Negative for gait problem and neck pain.  Skin: Negative for color change.  Neurological: Negative for numbness and headaches.  Hematological: Negative for adenopathy.  Psychiatric/Behavioral: Negative for behavioral problems.  All other systems reviewed and are negative.     Allergies  Sulfa drugs cross reactors; Ultram; Xarelto; and Zithromax  Home Medications   Prior to Admission medications   Medication Sig Start Date End Date Taking? Authorizing Provider  acetaminophen (TYLENOL) 500 MG tablet Take 500 mg by mouth every 8 (eight) hours as needed.    Historical Provider, MD  ALPRAZolam Duanne Moron) 1 MG tablet Take 1 tablet (1 mg total) by mouth 3 (three) times daily as needed. For anxiety 01/16/14   Darlin Coco, MD  atorvastatin (LIPITOR) 40 MG tablet Take 1 tablet (40 mg total) by mouth as directed. 1 TABLET ALTERNATING WITH 1/2 TABLET 07/08/13   Darlin Coco, MD  carvedilol (COREG) 6.25 MG tablet Take 1 tablet (6.25 mg total) by mouth 2 (two) times daily. 12/25/13   Darlin Coco, MD  cyclobenzaprine (FLEXERIL) 5 MG tablet Take 1 tablet (5 mg total) by mouth 3 (three) times daily as needed for muscle spasms. 05/23/13   Mary-Margaret Hassell Done, FNP  doxepin (SINEQUAN) 25 MG capsule 1 capsule every morning and 2 capsules at night 01/16/14   Darlin Coco, MD  fluconazole (DIFLUCAN) 150 MG tablet Take 1 tablet (150 mg total) by mouth once. 11/29/13   Lysbeth Penner, FNP  furosemide (LASIX) 20 MG tablet  04/22/13   Historical Provider, MD  gabapentin (NEURONTIN) 100 MG capsule Take 1 capsule (100 mg total) by mouth 4 (four) times daily. 07/05/13   Darlin Coco, MD  HYDROcodone-acetaminophen (NORCO) 5-325 MG per tablet Take 1 tablet by mouth every 6 (six) hours as needed for moderate pain. 06/13/13   Lysbeth Penner, FNP  hydroxypropyl methylcellulose (ISOPTO TEARS) 2.5 % ophthalmic solution Place 1 drop into both eyes 4 (four)  times daily as needed. Dry eyes    Historical Provider, MD  isosorbide mononitrate (IMDUR) 60 MG 24 hr tablet Take 1 tablet (60 mg total) by mouth daily. 07/05/13   Darlin Coco, MD  ketoconazole (NIZORAL) 2 % cream  08/22/13   Historical Provider, MD  lisinopril (PRINIVIL,ZESTRIL) 5 MG tablet Take 1 tablet (5 mg total) by mouth 2 (two) times daily. 08/21/13   Darlin Coco, MD  loratadine (CLARITIN) 10 MG tablet Take 10 mg by mouth daily.    Historical Provider, MD  Multiple Vitamin (MULTIVITAMIN) tablet Take 1 tablet by mouth daily.  Historical Provider, MD  mupirocin ointment (BACTROBAN) 2 %  08/12/13   Historical Provider, MD  nitroGLYCERIN (NITROSTAT) 0.4 MG SL tablet Place 1 tablet (0.4 mg total) under the tongue every 5 (five) minutes as needed. For chest pain 07/05/13   Darlin Coco, MD  omeprazole (PRILOSEC) 20 MG capsule Take 1 capsule (20 mg total) by mouth 2 (two) times daily. 03/25/13   Darlin Coco, MD  polyethylene glycol [facility] / GLYCOLAX) packet Take 13 g by mouth daily.    Historical Provider, MD  warfarin (COUMADIN) 5 MG tablet Take 1 tablet (5 mg total) by mouth at bedtime. 10/03/13   Lysbeth Penner, FNP   BP 128/62  Pulse 64  Temp(Src) 97.2 F (36.2 C) (Oral)  Resp 18  SpO2 100% Physical Exam  Nursing note and vitals reviewed. Constitutional: He is oriented to person, place, and time. He appears well-developed and well-nourished.  HENT:  Head: Normocephalic and atraumatic.  Right Ear: External ear normal.  Left Ear: External ear normal.  Nose: Nose normal.  Mouth/Throat: Oropharynx is clear and moist. No oropharyngeal exudate.  Eyes: Conjunctivae and EOM are normal. Pupils are equal, round, and reactive to light.  Neck: Normal range of motion. Neck supple.  Cardiovascular: Normal rate, regular rhythm, normal heart sounds and intact distal pulses.  Exam reveals no gallop and no friction rub.   No murmur heard. Pulmonary/Chest: Effort normal and breath  sounds normal. No respiratory distress. He has no wheezes.  Abdominal: Soft. Bowel sounds are normal. He exhibits no distension. There is no tenderness. There is no rebound and no guarding.  Musculoskeletal: Normal range of motion. He exhibits no edema and no tenderness.  Neurological: He is alert and oriented to person, place, and time.  Skin: Skin is warm and dry.  Psychiatric: He has a normal mood and affect. His behavior is normal.    ED Course  Procedures (including critical care time) Labs Review Labs Reviewed  BASIC METABOLIC PANEL - Abnormal; Notable for the following:    Sodium 126 (*)    Potassium 5.5 (*)    Chloride 89 (*)    GFR calc non Af Amer 72 (*)    GFR calc Af Amer 83 (*)    All other components within normal limits  PRO B NATRIURETIC PEPTIDE - Abnormal; Notable for the following:    Pro B Natriuretic peptide (BNP) 788.4 (*)    All other components within normal limits  PROTIME-INR - Abnormal; Notable for the following:    Prothrombin Time 24.3 (*)    INR 2.16 (*)    All other components within normal limits  CBC  TROPONIN I  I-STAT TROPOININ, ED    Imaging Review Dg Chest 2 View  02/15/2014   CLINICAL DATA:  Chest pain.  EXAM: CHEST  2 VIEW  COMPARISON:  PA and lateral chest 12/06/2013 and 07/09/2012.  FINDINGS: The lungs are clear. The patient is status post CABG with a pacing device in place. Heart size is normal. No pneumothorax or pleural effusion. No focal bony abnormality.  IMPRESSION: No acute disease.  Stable compared to prior exams.   Electronically Signed   By: Inge Rise M.D.   On: 02/15/2014 20:22     EKG Interpretation   Date/Time:  Saturday February 15 2014 17:41:58 EDT Ventricular Rate:  62 PR Interval:    QRS Duration: 192 QT Interval:  472 QTC Calculation: 479 R Axis:   -124 Text Interpretation:  Ventricular-paced rhythm Abnormal ECG No  significant  change since last tracing Confirmed by Rudine Rieger  MD, Shaquisha Wynn [phone]) on   02/15/2014 6:24:11 PM      MDM   Final diagnoses:  Other chest pain    6:36 PM 73 y.o. male w hx of CAD s/p CABG x 2 ('91, '07), complete heart block s/p pacemaker, CHF, a fib, hiatal hernia who presents with chest pressure which has been more frequent over the last 1-2 weeks. He had an episode around noon today which lasted approximately one hour. He took Mylanta and then 3 nitroglycerin. He states that the pain returned around 5 and again took Mylanta and 3 nitroglycerin but his pain persisted. He states that he has mild shortness of breath and currently continues to have chest pain 10 out of 10. He is afebrile and I'll signs are unremarkable here. Labs are thus far noncontributory. Still awaiting chest x-ray. He states that he did take 4 baby aspirin earlier today. He states that he has a history of a hiatal hernia and sometimes the symptoms with the hernia are similar to his chest pain. It is hard for him to delineate the two. Will place some nitro paste on his chest. Plan on consulting cardiology for their evaluation.  10:21 PM: I interpreted/reviewed the labs and/or imaging which were non-contributory.  Delta trop neg. Cards has seen the pt and believes the pt's sx are likely gi related as he is also having some mild choking which he has had previously. No stress induced ischemia on myoview during workup last year. Cards recommends delta trop and dispo per pt's sx. Pt feeling much better although he has some mild residual pain. I offered admission but he and his son feel comfortable going home.  I have discussed the diagnosis/risks/treatment options with the patient and family and believe the pt to be eligible for discharge home to follow-up with his GI physician on monday. We also discussed returning to the ED immediately if new or worsening sx occur. We discussed the sx which are most concerning (e.g., worsening pain, fever, vomiting) that necessitate immediate return. Medications administered  to the patient during their visit and any new prescriptions provided to the patient are listed below.  Medications given during this visit Medications  nitroGLYCERIN (NITROGLYN) 2 % ointment 0.5 inch (0.5 inches Topical Given 02/15/14 1852)  gi cocktail (Maalox,Lidocaine,Donnatal) (30 mLs Oral Given 02/15/14 1852)  ondansetron (ZOFRAN) injection 4 mg (4 mg Intravenous Given 02/15/14 1853)  fentaNYL (SUBLIMAZE) injection 50 mcg (50 mcg Intravenous Given 02/15/14 2010)  fentaNYL (SUBLIMAZE) injection 50 mcg (50 mcg Intravenous Given 02/15/14 2140)    New Prescriptions   OXYCODONE-ACETAMINOPHEN (PERCOCET) 5-325 MG PER TABLET    Take 1 tablet by mouth every 6 (six) hours as needed for moderate pain.      Pamella Pert, MD 02/15/14 2311

## 2014-02-15 NOTE — Consult Note (Signed)
Admit date: 02/15/2014 Referring Physician  Dr. Kenton Kingfisher Primary Physician Redge Gainer, MD Primary Cardiologist  Dr. Mare Ferrari Reason for Consultation  Chest pain  HPI: [name] is a pleasant 73 y.o male with CAD s/p CABG (1991, 2007 SVG->OM, VG->PDA->PLV); ICM (EF 30% in August 2015), hypertension, hyperlipidemia, AFL s/p unsuccessful ablation complicated by CHB requiring PPM placement, hiatal hernia, esophageal stricture, chronic AF who now presents with chest discomfort.   Pt is here with his son-in-law. Pt is able to provide accurate HP. Pt first experienced chest and throat discomfort at about 12pm today when he was relaxing in his recliner. He had salmon for lunch at 11am. He took mylanta and NTG when chest pain started and his symptoms resolved after about one hour. They came back later in the afternoon. He had radiation of his symptoms to his right arm. He did feel like something was stuck in his esophagus and he felt bad taste in his mouth when he laid flat. No diaphoresis, no nausea, no vomiting. No palpitations. When son-in-law got home, he became aware of the recurrence of pt's symptoms and so decided to bring him to the hospital. Pt tells [place] that he he had similar symptoms prior to his EGD and esophageal dilation last year. He had similar episode in November 2014 (after his esophageal dilation) and he was admitted to the hospital. His myoview stress test was negative at that time. Of note, pt exercised yesterday for 12 mins without symptoms of angina. He had to stop because of hip problems.    PMH:   Past Medical History  Diagnosis Date  . Ischemic cardiomyopathy     a. 06/2012 EF 25% by echo.  . Hypertension   . Hyperlipidemia   . GERD (gastroesophageal reflux disease)   . CAD (coronary artery disease)     a. 1991 s/p CABGx4;  b. redo CABG in 2007 (SVG->OM, VG->PDA->PLV);  c. 01/2011 Cath: LM 80ost, LAD 100p, LCX 100ost, OM1 100, RCA 100, LIMA->LAD ok, VG->PDA ok, VG->OM ok;   d. 03/2012 MV: EF 32% inf & inflat scar w/o ischemia; e. Lexi MV: EF 33%, inf infarct, no ischemia.  . Atrial flutter     a. 01/2011 s/p unsuccessful RFCA complicated by CHB req PPM.  . CHB (complete heart block)     a. 01/2011: in setting of RFCA, s/p SJM 2110 [facility] DC PPM, ser # [id number].  [name] Kitchen Kidney cysts     a. bilateral  . Hiatal hernia     a. 03/01/2013 EGD and esoph dil.  [name] Kitchen BPH (benign prostatic hyperplasia)     a. elevated PSA  . A-fib     a. chronic coumadin  . Elevated PSA, less than 10 ng/ml   . Fibromyalgia   . Skin cancer     'above left eyebrow; tip of my nose" (08/29/2012)  . Depression     "since 1986-1987" (08/29/2012)  . Anxiety   . Panic attacks   . Chronic systolic CHF (congestive heart failure), NYHA class 2     a. 06/2012 Echo: EF 25%, distal sept and apical AK, sev dil LV, mild LVH, mod dil LA.    PSH:   Past Surgical History  Procedure Laterality Date  . Coronary artery bypass graft  1991    CABG X 5  . Coronary artery bypass graft  06/2005    CABG X 3  . Cardiac catheterization  10/30/2009    Grafts patent. Mild to moderate LV dysfunction. EF 45%.  Managed medically.   . Cardiac catheterization      multiple  . Tonsillectomy  1960  . Cholecystectomy  2007  . Insert / replace / remove pacemaker  01/2011    initial placement  . Inguinal hernia repair  1960's    "left I think"  . Cataract extraction w/ intraocular lens  implant, bilateral  2012  . Cardioversion  05/25/2012    Procedure: CARDIOVERSION;  Surgeon: Darlin Coco, MD;  Location: [facility] ENDOSCOPY;  Service: Cardiovascular;  Laterality: N/A;  . Humerus surgery Left 1960    "growth on the bone taken off; not cancer" (08/29/2012)  . Excisional hemorrhoidectomy  ~ 1980  . Skin cancer excision      'above left eyebrow; tip of my nose" (08/29/2012)  . Atrial flutter ablation  01/2011    "didn't work" (08/29/2012)   Allergies:  Sulfa drugs cross reactors; Ultram; Xarelto; and Zithromax Prior to Admit  Meds:   Prior to Admission medications   Medication Sig Start Date End Date Taking? Authorizing Provider  acetaminophen (TYLENOL) 500 MG tablet Take 500 mg by mouth every 8 (eight) hours as needed.   Yes Historical Provider, MD  ALPRAZolam Duanne Moron) 1 MG tablet Take 1 tablet (1 mg total) by mouth 3 (three) times daily as needed. For anxiety 01/16/14  Yes Darlin Coco, MD  atorvastatin (LIPITOR) 40 MG tablet Take 1 tablet (40 mg total) by mouth as directed. 1 TABLET ALTERNATING WITH 1/2 TABLET 07/08/13  Yes Darlin Coco, MD  carvedilol (COREG) 6.25 MG tablet Take 1 tablet (6.25 mg total) by mouth 2 (two) times daily. 12/25/13  Yes Darlin Coco, MD  cyclobenzaprine (FLEXERIL) 5 MG tablet Take 1 tablet (5 mg total) by mouth 3 (three) times daily as needed for muscle spasms. 05/23/13  Yes Mary-Margaret Hassell Done, FNP  doxepin (SINEQUAN) 25 MG capsule 1 capsule every morning and 2 capsules at night 01/16/14  Yes Darlin Coco, MD  gabapentin (NEURONTIN) 100 MG capsule Take 1 capsule (100 mg total) by mouth 4 (four) times daily. 07/05/13  Yes Darlin Coco, MD  hydroxypropyl methylcellulose (ISOPTO TEARS) 2.5 % ophthalmic solution Place 1 drop into both eyes 4 (four) times daily as needed. Dry eyes   Yes Historical Provider, MD  isosorbide mononitrate (IMDUR) 60 MG 24 hr tablet Take 1 tablet (60 mg total) by mouth daily. 07/05/13  Yes Darlin Coco, MD  lisinopril (PRINIVIL,ZESTRIL) 5 MG tablet Take 1 tablet (5 mg total) by mouth 2 (two) times daily. 08/21/13  Yes Darlin Coco, MD  loratadine (CLARITIN) 10 MG tablet Take 10 mg by mouth daily.   Yes Historical Provider, MD  Multiple Vitamin (MULTIVITAMIN) tablet Take 1 tablet by mouth daily.     Yes Historical Provider, MD  nitroGLYCERIN (NITROSTAT) 0.4 MG SL tablet Place 1 tablet (0.4 mg total) under the tongue every 5 (five) minutes as needed. For chest pain 07/05/13  Yes Darlin Coco, MD  omeprazole (PRILOSEC) 20 MG capsule Take 1 capsule (20  mg total) by mouth 2 (two) times daily. 03/25/13  Yes Darlin Coco, MD  polyethylene glycol (MIRALAX / GLYCOLAX) packet Take 13 g by mouth daily.   Yes Historical Provider, MD  warfarin (COUMADIN) 5 MG tablet Take 1 tablet (5 mg total) by mouth at bedtime. 10/03/13  Yes Lysbeth Penner, FNP   Fam HX:    Family History  Problem Relation Age of Onset  . Stroke Mother   . Heart attack Father     died @ 70, mult  MI's.   Social HX:    History   Social History  . Marital Status: Widowed    Spouse Name: N/A    Number of Children: 2  . Years of Education: 12   Occupational History  . Not on file.   Social History Main Topics  . Smoking status: Former Smoker -- 0.50 packs/day for 10 years    Types: Cigarettes    Quit date: 09/21/1973  . Smokeless tobacco: Never Used  . Alcohol Use: No  . Drug Use: No  . Sexual Activity: No   Other Topics Concern  . Not on file   Social History Narrative   Wife passed away in 08/19/11. Lives in [place] with his daughter and her family.  Retired from [employer].     ROS:  All 11 ROS were addressed and are negative except what is stated in the HPI   Physical Exam: Blood pressure 129/71, pulse 67, temperature 97.2 F (36.2 C), temperature source Oral, resp. rate 16, SpO2 100.00%.   Gen - pt is alert, awake and oriented. In no distress Neck - flat JVP Lungs - ctab CV - rrr, s1s2 heard, no m/r/g Abd - +bd, nd, nt Ext - no edema Psych:  Good affect, responds appropriately      Labs: Lab Results  Component Value Date   WBC 6.6 02/15/2014   HGB 13.9 02/15/2014   HCT 41.1 02/15/2014   MCV 89.5 02/15/2014   PLT 187 02/15/2014     Recent Labs Lab 02/15/14 1744  NA 126*  K 5.5*  CL 89*  CO2 27  BUN 15  CREATININE 1.01  CALCIUM 9.1  GLUCOSE 95    Recent Labs  02/15/14 2117-08-18  TROPONINI <0.30   Lab Results  Component Value Date   CHOL 115 01/10/2014   HDL 40.80 01/10/2014   LDLCALC 61 01/10/2014   TRIG 65.0  01/10/2014   No results found for this basename: DDIMER     Radiology:  Dg Chest 2 View  02/15/2014   CLINICAL DATA:  Chest pain.  EXAM: CHEST  2 VIEW  COMPARISON:  PA and lateral chest 12/06/2013 and 07/09/2012.  FINDINGS: The lungs are clear. The patient is status post CABG with a pacing device in place. Heart size is normal. No pneumothorax or pleural effusion. No focal bony abnormality.  IMPRESSION: No acute disease.  Stable compared to prior exams.   Electronically Signed   By: Inge Rise M.D.   On: 02/15/2014 20:22   Personally viewed.  EKG:  Vpaced.   ASSESSMENT: [name] is a pleasant 73 y.o male with CAD s/p CABG (1991, 2007 SVG->OM, VG->PDA->PLV); ICM (EF 30% in August 2015), hypertension, hyperlipidemia, AFL s/p unsuccessful ablation complicated by CHB requiring PPM placement, hiatal hernia, esophageal stricture, chronic AF who now presents with chest discomfort. Even thought pt is high risk for CAD, his symptoms are not the most typical for angina. His negative NM myoview study in 08/18/12 is reassuring. Regardless, we will rule him out for ACS. Pt tells me that he has required esophageal dilatation once per day. Now with recurring of choking symptoms, he may need outpatient evaluation by gastroenterologist.   PLAN:   - agree with ruling him out for ACS - GI cocktail - outpatient evaluation by gastroenterologist for due to recurrence of swallowing difficulties  Thank you for this consult.   Orson Gear, MD  02/15/2014  10:11 PM

## 2014-02-15 NOTE — ED Notes (Signed)
Discharge instructions reviewed with pt. Pt verbalized understanding.   

## 2014-02-15 NOTE — Discharge Instructions (Signed)

## 2014-02-15 NOTE — ED Notes (Signed)
Dr Harrison at bedside

## 2014-02-17 NOTE — Telephone Encounter (Signed)
Has seen Dr Nelva Bush in the past. Will call the office to get an appointment. Also patient seen in ED over the weekend for chest pain Per patient was told secondary to his hernia.  Has appointment with GI tomorrow Will call back Wednesday with update

## 2014-02-18 ENCOUNTER — Ambulatory Visit (INDEPENDENT_AMBULATORY_CARE_PROVIDER_SITE_OTHER): Payer: Medicare Other | Admitting: Pharmacist Clinician (PhC)/ Clinical Pharmacy Specialist

## 2014-02-18 DIAGNOSIS — I4891 Unspecified atrial fibrillation: Secondary | ICD-10-CM

## 2014-02-18 LAB — POCT INR: INR: 2.4

## 2014-02-19 NOTE — Telephone Encounter (Signed)
Patient will call Dr Nelva Bush in am

## 2014-02-19 NOTE — Telephone Encounter (Signed)
Follow up  ° ° °Patient calling back to speak with nurse  °

## 2014-02-20 ENCOUNTER — Telehealth: Payer: Self-pay | Admitting: Cardiology

## 2014-02-20 NOTE — Telephone Encounter (Signed)
New message      Pt was told that he has a heart murmur--he need to have an endoscopy on Monday.  Will it be ok to have this test and he has a murmur.  He cannot remember being told that he has a murmur.

## 2014-02-20 NOTE — Telephone Encounter (Signed)
Okay to have esophageal dilatation.

## 2014-02-20 NOTE — Telephone Encounter (Signed)
Has anything developed since last November that would prevent him from have another esophagus dilation.  They are not recommending hold Warfarin, like last year  Will forward to  Dr. Mare Ferrari for review

## 2014-02-21 NOTE — Telephone Encounter (Signed)
Advised patient

## 2014-02-25 ENCOUNTER — Encounter (HOSPITAL_COMMUNITY): Payer: Self-pay | Admitting: Emergency Medicine

## 2014-02-25 ENCOUNTER — Emergency Department (HOSPITAL_COMMUNITY)
Admission: EM | Admit: 2014-02-25 | Discharge: 2014-02-26 | Disposition: A | Discharge status: Home / Self Care | Payer: Medicare Other | Attending: Emergency Medicine | Admitting: Emergency Medicine

## 2014-02-25 ENCOUNTER — Emergency Department (HOSPITAL_COMMUNITY): Payer: Medicare Other

## 2014-02-25 DIAGNOSIS — W19XXXA Unspecified fall, initial encounter: Secondary | ICD-10-CM

## 2014-02-25 DIAGNOSIS — I2510 Atherosclerotic heart disease of native coronary artery without angina pectoris: Secondary | ICD-10-CM | POA: Diagnosis not present

## 2014-02-25 DIAGNOSIS — Z87891 Personal history of nicotine dependence: Secondary | ICD-10-CM | POA: Insufficient documentation

## 2014-02-25 DIAGNOSIS — E785 Hyperlipidemia, unspecified: Secondary | ICD-10-CM | POA: Diagnosis not present

## 2014-02-25 DIAGNOSIS — Z951 Presence of aortocoronary bypass graft: Secondary | ICD-10-CM | POA: Diagnosis not present

## 2014-02-25 DIAGNOSIS — I5022 Chronic systolic (congestive) heart failure: Secondary | ICD-10-CM | POA: Diagnosis not present

## 2014-02-25 DIAGNOSIS — Z79899 Other long term (current) drug therapy: Secondary | ICD-10-CM | POA: Insufficient documentation

## 2014-02-25 DIAGNOSIS — S0990XA Unspecified injury of head, initial encounter: Secondary | ICD-10-CM

## 2014-02-25 DIAGNOSIS — K219 Gastro-esophageal reflux disease without esophagitis: Secondary | ICD-10-CM | POA: Diagnosis not present

## 2014-02-25 DIAGNOSIS — F410 Panic disorder [episodic paroxysmal anxiety] without agoraphobia: Secondary | ICD-10-CM | POA: Diagnosis not present

## 2014-02-25 DIAGNOSIS — Z7901 Long term (current) use of anticoagulants: Secondary | ICD-10-CM | POA: Insufficient documentation

## 2014-02-25 DIAGNOSIS — Z85828 Personal history of other malignant neoplasm of skin: Secondary | ICD-10-CM | POA: Diagnosis not present

## 2014-02-25 DIAGNOSIS — I10 Essential (primary) hypertension: Secondary | ICD-10-CM | POA: Insufficient documentation

## 2014-02-25 DIAGNOSIS — F329 Major depressive disorder, single episode, unspecified: Secondary | ICD-10-CM | POA: Insufficient documentation

## 2014-02-25 NOTE — ED Provider Notes (Signed)
CSN: [id number]     Arrival date & time 02/25/14  2300 History   First MD Initiated Contact with Patient 02/25/14 2325     Chief Complaint  Patient presents with  . Fall  . Head Injury     (Consider location/radiation/quality/duration/timing/severity/associated sxs/prior Treatment) HPI  This is a 73 year old male with multiple medical problems who presents following a fall. Patient reports that he tripped and fell this morning at approximate 7 AM. He does take Coumadin for atrial fibrillation. Patient reports that he did not lose consciousness and the fall was mechanical. He denies any other injury. He states that approximately 5-6 hours later he had onset of headache. He states his head is still hurting and reports pain that is 9 out of 10. He took pain medication at home which did not help. He denies any vision changes, weakness, numbness, tingling. He was advised by his primary physician to be further evaluated.  Past Medical History  Diagnosis Date  . Ischemic cardiomyopathy     a. 06/2012 EF 25% by echo.  . Hypertension   . Hyperlipidemia   . GERD (gastroesophageal reflux disease)   . CAD (coronary artery disease)     a. 1991 s/p CABGx4;  b. redo CABG in 2007 (SVG->OM, VG->PDA->PLV);  c. 01/2011 Cath: LM 80ost, LAD 100p, LCX 100ost, OM1 100, RCA 100, LIMA->LAD ok, VG->PDA ok, VG->OM ok;  d. 03/2012 MV: EF 32% inf & inflat scar w/o ischemia; e. Lexi MV: EF 33%, inf infarct, no ischemia.  . Atrial flutter     a. 01/2011 s/p unsuccessful RFCA complicated by CHB req PPM.  . CHB (complete heart block)     a. 01/2011: in setting of RFCA, s/p SJM 2110 [facility] DC PPM, ser # [id number].  [name] Kitchen Kidney cysts     a. bilateral  . Hiatal hernia     a. 03/01/2013 EGD and esoph dil.  [name] Kitchen BPH (benign prostatic hyperplasia)     a. elevated PSA  . A-fib     a. chronic coumadin  . Elevated PSA, less than 10 ng/ml   . Fibromyalgia   . Skin cancer     'above left eyebrow; tip of my nose" (08/29/2012)  .  Depression     "since 1986-1987" (08/29/2012)  . Anxiety   . Panic attacks   . Chronic systolic CHF (congestive heart failure), NYHA class 2     a. 06/2012 Echo: EF 25%, distal sept and apical AK, sev dil LV, mild LVH, mod dil LA.   Past Surgical History  Procedure Laterality Date  . Coronary artery bypass graft  1991    CABG X 5  . Coronary artery bypass graft  06/2005    CABG X 3  . Cardiac catheterization  10/30/2009    Grafts patent. Mild to moderate LV dysfunction. EF 45%. Managed medically.   . Cardiac catheterization      multiple  . Tonsillectomy  1960  . Cholecystectomy  2007  . Insert / replace / remove pacemaker  01/2011    initial placement  . Inguinal hernia repair  1960's    "left I think"  . Cataract extraction w/ intraocular lens  implant, bilateral  2012  . Cardioversion  05/25/2012    Procedure: CARDIOVERSION;  Surgeon: Darlin Coco, MD;  Location[facility] ENDOSCOPY;  Service: Cardiovascular;  Laterality: N/A;  . Humerus surgery Left 1960    "growth on the bone taken off; not cancer" (08/29/2012)  . Excisional hemorrhoidectomy  ~ 1980  .  Skin cancer excision      'above left eyebrow; tip of my nose" (08/29/2012)  . Atrial flutter ablation  01/2011    "didn't work" (08/29/2012)   Family History  Problem Relation Age of Onset  . Stroke Mother   . Heart attack Father     died @ 58, mult MI's.   History  Substance Use Topics  . Smoking status: Former Smoker -- 0.50 packs/day for 10 years    Types: Cigarettes    Quit date: 09/21/1973  . Smokeless tobacco: Never Used  . Alcohol Use: No    Review of Systems  Constitutional: Negative for fever.  Respiratory: Negative for chest tightness and shortness of breath.   Cardiovascular: Negative for chest pain.  Gastrointestinal: Negative for nausea, vomiting and abdominal pain.  Skin: Negative for wound.  Neurological: Positive for headaches. Negative for dizziness, seizures, weakness, light-headedness and numbness.   All other systems reviewed and are negative.     Allergies  Sulfa drugs cross reactors; Ultram; Xarelto; and Zithromax  Home Medications   Prior to Admission medications   Medication Sig Start Date End Date Taking? Authorizing Provider  acetaminophen (TYLENOL) 500 MG tablet Take 500 mg by mouth every 8 (eight) hours as needed.    Historical Provider, MD  ALPRAZolam Duanne Moron) 1 MG tablet Take 1 tablet (1 mg total) by mouth 3 (three) times daily as needed. For anxiety 01/16/14   Darlin Coco, MD  atorvastatin (LIPITOR) 40 MG tablet Take 1 tablet (40 mg total) by mouth as directed. 1 TABLET ALTERNATING WITH 1/2 TABLET 07/08/13   Darlin Coco, MD  carvedilol (COREG) 6.25 MG tablet Take 1 tablet (6.25 mg total) by mouth 2 (two) times daily. 12/25/13   Darlin Coco, MD  cyclobenzaprine (FLEXERIL) 5 MG tablet Take 1 tablet (5 mg total) by mouth 3 (three) times daily as needed for muscle spasms. 05/23/13   Mary-Margaret Hassell Done, FNP  doxepin (SINEQUAN) 25 MG capsule 1 capsule every morning and 2 capsules at night 01/16/14   Darlin Coco, MD  gabapentin (NEURONTIN) 100 MG capsule Take 1 capsule (100 mg total) by mouth 4 (four) times daily. 07/05/13   Darlin Coco, MD  hydroxypropyl methylcellulose (ISOPTO TEARS) 2.5 % ophthalmic solution Place 1 drop into both eyes 4 (four) times daily as needed. Dry eyes    Historical Provider, MD  isosorbide mononitrate (IMDUR) 60 MG 24 hr tablet Take 1 tablet (60 mg total) by mouth daily. 07/05/13   Darlin Coco, MD  lisinopril (PRINIVIL,ZESTRIL) 5 MG tablet Take 1 tablet (5 mg total) by mouth 2 (two) times daily. 08/21/13   Darlin Coco, MD  loratadine (CLARITIN) 10 MG tablet Take 10 mg by mouth daily.    Historical Provider, MD  Multiple Vitamin (MULTIVITAMIN) tablet Take 1 tablet by mouth daily.      Historical Provider, MD  nitroGLYCERIN (NITROSTAT) 0.4 MG SL tablet Place 1 tablet (0.4 mg total) under the tongue every 5 (five) minutes as needed.  For chest pain 07/05/13   Darlin Coco, MD  omeprazole (PRILOSEC) 20 MG capsule Take 1 capsule (20 mg total) by mouth 2 (two) times daily. 03/25/13   Darlin Coco, MD  oxyCODONE-acetaminophen (PERCOCET) 5-325 MG per tablet Take 1 tablet by mouth every 6 (six) hours as needed for moderate pain. 02/15/14   Pamella Pert, MD  polyethylene glycol (MIRALAX / GLYCOLAX) packet Take 13 g by mouth daily.    Historical Provider, MD  warfarin (COUMADIN) 5 MG tablet Take 1 tablet (5  mg total) by mouth at bedtime. 10/03/13   Lysbeth Penner, FNP   BP 122/78  Pulse 64  Temp(Src) 97.7 F (36.5 C) (Oral)  Resp 20  Ht 5\' 11"  (1.803 m)  Wt 185 lb (83.915 kg)  BMI 25.81 kg/m2  SpO2 100% Physical Exam  Nursing note and vitals reviewed. Constitutional: He is oriented to person, place, and time. He appears well-developed and well-nourished. No distress.  HENT:  Head: Normocephalic and atraumatic.  Eyes: EOM are normal. Pupils are equal, round, and reactive to light.  Neck: Normal range of motion. Neck supple.  Cardiovascular: Normal rate and normal heart sounds.   Irregular rhythm  Pulmonary/Chest: Effort normal and breath sounds normal. No respiratory distress. He has no wheezes.  Abdominal: Soft. There is no tenderness.  Musculoskeletal: He exhibits no edema.  Lymphadenopathy:    He has no cervical adenopathy.  Neurological: He is alert and oriented to person, place, and time.  Skin: Skin is warm and dry.  Psychiatric: He has a normal mood and affect.    ED Course  Procedures (including critical care time) Labs Review Labs Reviewed  PROTIME-INR - Abnormal; Notable for the following:    Prothrombin Time 27.3 (*)    INR 2.51 (*)    All other components within normal limits    Imaging Review Ct Head Wo Contrast  02/26/2014   CLINICAL DATA:  Head trauma after fall.  Initial encounter  EXAM: CT HEAD WITHOUT CONTRAST  TECHNIQUE: Contiguous axial images were obtained from the base of the  skull through the vertex without intravenous contrast.  COMPARISON:  01/13/2012  FINDINGS: Skull and Sinuses:Negative for fracture. No sinus or mastoid effusion.  Orbits: No traumatic findings.  Bilateral cataract resection.  Brain: No evidence of acute abnormality, such as acute infarction, hemorrhage, hydrocephalus, or mass lesion/mass effect. No notable atrophy or ischemic change for age.  IMPRESSION: No acute intracranial injury.   Electronically Signed   By: Jorje Guild M.D.   On: 02/26/2014 00:44     EKG Interpretation None      MDM   Final diagnoses:  Head trauma  Fall, initial encounter    Patient presents with fall greater than 12 hours ago. Reports headache. Is on Coumadin and primary care physician worried for bleed. Patient is nonfocal and without signs of obvious trauma. He is therapeutic on his Coumadin. CT scan showed no evidence of acute bleed.  Low suspicion at this time for acute emergent process.  After history, exam, and medical workup I feel the patient has been appropriately medically screened and is safe for discharge home. Pertinent diagnoses were discussed with the patient. Patient was given return precautions.   Merryl Hacker, MD 02/26/14 [phone]

## 2014-02-25 NOTE — ED Notes (Signed)
I tripped and fell this morning around 0700. Hit my head on the dresser. Called my family MD and they told me that I needed to be seen since my head was still hurting and I am on blood thinner.

## 2014-02-26 ENCOUNTER — Telehealth: Payer: Self-pay | Admitting: Family Medicine

## 2014-02-26 LAB — PROTIME-INR
INR: 2.51 — AB (ref 0.00–1.49)
Prothrombin Time: 27.3 seconds — ABNORMAL HIGH (ref 11.6–15.2)

## 2014-02-26 MED ORDER — OXYCODONE-ACETAMINOPHEN 5-325 MG PO TABS
1.0000 | ORAL_TABLET | Freq: Once | ORAL | Status: AC
  Administered 2014-02-26: 1 via ORAL
  Filled 2014-02-26: qty 1

## 2014-02-26 NOTE — Telephone Encounter (Signed)
With a normal CT and no dizziness and if he's been driving he can continue to drive

## 2014-02-26 NOTE — Discharge Instructions (Signed)
You were seen today for a fall resulting in hitting your head.  You are on anticoagulation putting you at risk for head bleed. CT head is negative at this time.  Head Injury You have received a head injury. It does not appear serious at this time. Headaches and vomiting are common following head injury. It should be easy to awaken from sleeping. Sometimes it is necessary for you to stay in the emergency department for a while for observation. Sometimes admission to the hospital may be needed. After injuries such as yours, most problems occur within the first 24 hours, but side effects may occur up to 7-10 days after the injury. It is important for you to carefully monitor your condition and contact your health care provider or seek immediate medical care if there is a change in your condition. WHAT ARE THE TYPES OF HEAD INJURIES? Head injuries can be as minor as a bump. Some head injuries can be more severe. More severe head injuries include:  A jarring injury to the brain (concussion).  A bruise of the brain (contusion). This mean there is bleeding in the brain that can cause swelling.  A cracked skull (skull fracture).  Bleeding in the brain that collects, clots, and forms a bump (hematoma). WHAT CAUSES A HEAD INJURY? A serious head injury is most likely to happen to someone who is in a car wreck and is not wearing a seat belt. Other causes of major head injuries include bicycle or motorcycle accidents, sports injuries, and falls. HOW ARE HEAD INJURIES DIAGNOSED? A complete history of the event leading to the injury and your current symptoms will be helpful in diagnosing head injuries. Many times, pictures of the brain, such as CT or MRI are needed to see the extent of the injury. Often, an overnight hospital stay is necessary for observation.  WHEN SHOULD I SEEK IMMEDIATE MEDICAL CARE?  You should get help right away if:  You have confusion or drowsiness.  You feel sick to your stomach  (nauseous) or have continued, forceful vomiting.  You have dizziness or unsteadiness that is getting worse.  You have severe, continued headaches not relieved by medicine. Only take over-the-counter or prescription medicines for pain, fever, or discomfort as directed by your health care provider.  You do not have normal function of the arms or legs or are unable to walk.  You notice changes in the black spots in the center of the colored part of your eye (pupil).  You have a clear or bloody fluid coming from your nose or ears.  You have a loss of vision. During the next 24 hours after the injury, you must stay with someone who can watch you for the warning signs. This person should contact local emergency services (911 in the [place] if you have seizures, you become unconscious, or you are unable to wake up. HOW CAN I PREVENT A HEAD INJURY IN THE FUTURE? The most important factor for preventing major head injuries is avoiding motor vehicle accidents. To minimize the potential for damage to your head, it is crucial to wear seat belts while riding in motor vehicles. Wearing helmets while bike riding and playing collision sports (like football) is also helpful. Also, avoiding dangerous activities around the house will further help reduce your risk of head injury.  WHEN CAN I RETURN TO NORMAL ACTIVITIES AND ATHLETICS? You should be reevaluated by your health care provider before returning to these activities. If you have any of the following symptoms,  you should not return to activities or contact sports until 1 week after the symptoms have stopped:  Persistent headache.  Dizziness or vertigo.  Poor attention and concentration.  Confusion.  Memory problems.  Nausea or vomiting.  Fatigue or tire easily.  Irritability.  Intolerant of bright lights or loud noises.  Anxiety or depression.  Disturbed sleep. MAKE SURE YOU:   Understand these instructions.  Will watch your  condition.  Will get help right away if you are not doing well or get worse. Document Released: 04/18/2005 Document Revised: 04/23/2013 Document Reviewed: 12/24/2012 [facility] Patient Information 2015 [place], [place]. This information is not intended to replace advice given to you by your health care provider. Make sure you discuss any questions you have with your health care provider.

## 2014-02-27 NOTE — Telephone Encounter (Signed)
Patient aware.

## 2014-03-03 ENCOUNTER — Telehealth: Payer: Self-pay | Admitting: Family Medicine

## 2014-03-04 NOTE — Telephone Encounter (Signed)
Pt feels he has sinus problems  He states its better today than it was yesterday.

## 2014-03-10 ENCOUNTER — Ambulatory Visit (INDEPENDENT_AMBULATORY_CARE_PROVIDER_SITE_OTHER): Payer: Medicare Other | Admitting: Family Medicine

## 2014-03-10 ENCOUNTER — Encounter: Payer: Self-pay | Admitting: Family Medicine

## 2014-03-10 VITALS — BP 127/80 | HR 80 | Temp 96.8°F | Ht 70.0 in | Wt 185.0 lb

## 2014-03-10 DIAGNOSIS — B370 Candidal stomatitis: Secondary | ICD-10-CM

## 2014-03-10 MED ORDER — FLUCONAZOLE 150 MG PO TABS: 150.0000 mg | ORAL_TABLET | Freq: Once | ORAL | Status: DC

## 2014-03-10 NOTE — Progress Notes (Signed)
   Subjective:    Patient ID: [name], male    DOB: June 28, 1940, 73 y.o.   MRN: [id number]  HPI Patient c/o thrush.  He states his orthopedic doctor wrote some abx's  Review of Systems  Constitutional: Negative for fever.  HENT: Negative for ear pain.   Eyes: Negative for discharge.  Respiratory: Negative for cough.   Cardiovascular: Negative for chest pain.  Gastrointestinal: Negative for abdominal distention.  Endocrine: Negative for polyuria.  Genitourinary: Negative for difficulty urinating.  Musculoskeletal: Negative for gait problem and neck pain.  Skin: Negative for color change and rash.  Neurological: Negative for speech difficulty and headaches.  Psychiatric/Behavioral: Negative for agitation.       Objective:    BP 127/80 mmHg  Pulse 80  Temp(Src) 96.8 F (36 C) (Oral)  Ht 5\' 10"  (1.778 m)  Wt 185 lb (83.915 kg)  BMI 26.54 kg/m2 Physical Exam  Constitutional: He is oriented to person, place, and time. He appears well-developed and well-nourished.  HENT:  Head: Normocephalic and atraumatic.  Mouth/Throat: Oropharynx is clear and moist.  Eyes: Pupils are equal, round, and reactive to light.  Neck: Normal range of motion. Neck supple.  Cardiovascular: Normal rate and regular rhythm.   No murmur heard. Pulmonary/Chest: Effort normal and breath sounds normal.  Abdominal: Soft. Bowel sounds are normal. There is no tenderness.  Neurological: He is alert and oriented to person, place, and time.  Skin: Skin is warm and dry.  Psychiatric: He has a normal mood and affect.          Assessment & Plan:     ICD-9-CM ICD-10-CM   1. Oral candidiasis 112.0 B37.0 fluconazole (DIFLUCAN) 150 MG tablet     No Follow-up on file.  Lysbeth Penner FNP

## 2014-03-11 ENCOUNTER — Telehealth: Payer: Self-pay | Admitting: Family Medicine

## 2014-03-11 NOTE — Telephone Encounter (Signed)
Script for diflucan called to [facility].  Script for diflucan at [place] cancelled.

## 2014-03-15 ENCOUNTER — Other Ambulatory Visit: Payer: Self-pay | Admitting: *Deleted

## 2014-03-15 MED ORDER — NYSTATIN 100000 UNIT/ML MT SUSP: 5.0000 mL | Freq: Four times a day (QID) | OROMUCOSAL | Status: DC

## 2014-03-15 NOTE — Progress Notes (Signed)
Pt came into clinic c/o continued thrush Per Dietrich Pates pt can try Nystatin swish and swallow RX sent into [facility] Pt verbalizes understanding

## 2014-03-18 ENCOUNTER — Telehealth: Payer: Self-pay | Admitting: Family Medicine

## 2014-03-18 NOTE — Telephone Encounter (Signed)
Pt given appt tomorrow with Dietrich Pates @ 4:45

## 2014-03-19 ENCOUNTER — Encounter: Payer: Self-pay | Admitting: Family Medicine

## 2014-03-19 ENCOUNTER — Ambulatory Visit (INDEPENDENT_AMBULATORY_CARE_PROVIDER_SITE_OTHER): Payer: Medicare Other | Admitting: Family Medicine

## 2014-03-19 ENCOUNTER — Telehealth: Payer: Self-pay | Admitting: Cardiology

## 2014-03-19 VITALS — BP 133/75 | HR 88 | Temp 96.3°F | Ht 70.0 in | Wt 186.4 lb

## 2014-03-19 DIAGNOSIS — I739 Peripheral vascular disease, unspecified: Secondary | ICD-10-CM

## 2014-03-19 NOTE — Telephone Encounter (Signed)
New msg  Pt would like a phone call back because he doesn't have any feeling in his legs.

## 2014-03-19 NOTE — Telephone Encounter (Signed)
Pt states starting over the weekend he noticed heaviness in both legs. He  denies numbness in his feet, but does have pain in both his legs when he walks.  We discussed referral to PV as per Dr Sherryl Barters note on [facility] report mid-September.  Pt states he also has back problems that may be contributing to his leg problems.  Pt has appt at [facility] this afternoon for this problem.  I advised pt to keep appt this afternoon for further evaluation and go from there as far as PV referral is concerned. Pt agreed.

## 2014-03-19 NOTE — Progress Notes (Signed)
   Subjective:    Patient ID: [name], male    DOB: January 05, 1941, 73 y.o.   MRN: [id number]  HPI Patient is here for c/o lower extremity discomfort.  He had an arterial doppler US done in 9/15 and he states he was told by his cardiology office that the cardiologist was out of town and to follow up with his pcp.  He has left worse than right PVD according to the arterial doppler.  Review of Systems  Constitutional: Negative for fever.  HENT: Negative for ear pain.   Eyes: Negative for discharge.  Respiratory: Negative for cough.   Cardiovascular: Negative for chest pain.  Gastrointestinal: Negative for abdominal distention.  Endocrine: Negative for polyuria.  Genitourinary: Negative for difficulty urinating.  Musculoskeletal: Negative for gait problem and neck pain.  Skin: Negative for color change and rash.  Neurological: Negative for speech difficulty and headaches.  Psychiatric/Behavioral: Negative for agitation.       Objective:    BP 133/75 mmHg  Pulse 88  Temp(Src) 96.3 F (35.7 C) (Oral)  Ht 5\' 10"  (1.778 m)  Wt 186 lb 6.4 oz (84.55 kg)  BMI 26.75 kg/m2 Physical Exam  Constitutional: He is oriented to person, place, and time. He appears well-developed and well-nourished.  HENT:  Head: Normocephalic and atraumatic.  Mouth/Throat: Oropharynx is clear and moist.  Eyes: Pupils are equal, round, and reactive to light.  Neck: Normal range of motion. Neck supple.  Cardiovascular: Normal rate and regular rhythm.   No murmur heard. Pulmonary/Chest: Effort normal and breath sounds normal.  Abdominal: Soft. Bowel sounds are normal. There is no tenderness.  Neurological: He is alert and oriented to person, place, and time.  Skin: Skin is warm and dry.  Psychiatric: He has a normal mood and affect.          Assessment & Plan:     ICD-9-CM ICD-10-CM   1. PVD (peripheral vascular disease) 443.9 I73.9 Ambulatory referral to Vascular Surgery   Refer to Vascular  surgery.  No Follow-up on file.  Lysbeth Penner FNP

## 2014-03-20 ENCOUNTER — Telehealth: Payer: Self-pay | Admitting: Family Medicine

## 2014-03-20 NOTE — Telephone Encounter (Signed)
Spoke to pt advised ok to take benadryl for the itching if needed only takes the oxycodone at night.

## 2014-03-25 ENCOUNTER — Telehealth: Payer: Self-pay | Admitting: Cardiology

## 2014-03-25 ENCOUNTER — Ambulatory Visit (INDEPENDENT_AMBULATORY_CARE_PROVIDER_SITE_OTHER): Payer: Medicare Other | Admitting: Pharmacist Clinician (PhC)/ Clinical Pharmacy Specialist

## 2014-03-25 DIAGNOSIS — I4891 Unspecified atrial fibrillation: Secondary | ICD-10-CM

## 2014-03-25 LAB — POCT INR: INR: 3.1

## 2014-03-25 NOTE — Telephone Encounter (Signed)
Having problems with heaviness in legs, sudden onset about 9 days ago Saw PCP and scheduled appointment with Dr Scot Dock 12/18 Feels like getting worse last few days Will forward to  Dr. Mare Ferrari for review

## 2014-03-25 NOTE — Telephone Encounter (Signed)
New msg  Patient states he's having some trouble with his legs, his right leg feels heavy from the knee down, then his left leg started. He wants to know if there is anything that he should do. Please contact at [phone].

## 2014-03-25 NOTE — Telephone Encounter (Signed)
Keep appointment as scheduled.  If pain becomes much worse go to ER.

## 2014-03-26 ENCOUNTER — Telehealth: Payer: Self-pay | Admitting: Vascular Surgery

## 2014-03-26 NOTE — Telephone Encounter (Signed)
Spoke with pt. Moved pt's appointment up per call from [facility] in Dr. Sherryl Barters office - pt having "increased issues" - ok per stephanie.

## 2014-03-26 NOTE — Telephone Encounter (Signed)
Spoke with patient, advised of  Dr. Sherryl Barters recommendations Did call and leave message at [facility] to see if appointment could be moved up. Asked them to call patient directly

## 2014-03-31 ENCOUNTER — Other Ambulatory Visit: Payer: Self-pay

## 2014-03-31 ENCOUNTER — Other Ambulatory Visit: Payer: Self-pay | Admitting: Cardiology

## 2014-03-31 DIAGNOSIS — I70219 Atherosclerosis of native arteries of extremities with intermittent claudication, unspecified extremity: Secondary | ICD-10-CM

## 2014-03-31 DIAGNOSIS — I739 Peripheral vascular disease, unspecified: Secondary | ICD-10-CM

## 2014-04-01 ENCOUNTER — Encounter: Payer: Self-pay | Admitting: Family Medicine

## 2014-04-01 ENCOUNTER — Encounter: Payer: Self-pay | Admitting: Vascular Surgery

## 2014-04-01 ENCOUNTER — Ambulatory Visit (INDEPENDENT_AMBULATORY_CARE_PROVIDER_SITE_OTHER): Payer: Medicare Other | Admitting: Family Medicine

## 2014-04-01 VITALS — BP 84/51 | HR 67 | Temp 97.0°F | Ht 70.0 in | Wt 184.4 lb

## 2014-04-01 DIAGNOSIS — R42 Dizziness and giddiness: Secondary | ICD-10-CM

## 2014-04-01 DIAGNOSIS — B349 Viral infection, unspecified: Secondary | ICD-10-CM

## 2014-04-01 DIAGNOSIS — I959 Hypotension, unspecified: Secondary | ICD-10-CM

## 2014-04-01 DIAGNOSIS — R1031 Right lower quadrant pain: Secondary | ICD-10-CM

## 2014-04-01 DIAGNOSIS — R110 Nausea: Secondary | ICD-10-CM

## 2014-04-01 DIAGNOSIS — I4891 Unspecified atrial fibrillation: Secondary | ICD-10-CM

## 2014-04-01 LAB — POCT URINALYSIS DIPSTICK
Bilirubin, UA: NEGATIVE
GLUCOSE UA: NEGATIVE
Ketones, UA: NEGATIVE
NITRITE UA: NEGATIVE
PH UA: 6.5
Protein, UA: NEGATIVE
RBC UA: NEGATIVE
Urobilinogen, UA: NEGATIVE

## 2014-04-01 LAB — POCT CBC
Granulocyte percent: 69.3 %G (ref 37–80)
HCT, POC: 41.9 % — AB (ref 43.5–53.7)
Hemoglobin: 13.9 g/dL — AB (ref 14.1–18.1)
Lymph, poc: 1.9 (ref 0.6–3.4)
MCH, POC: 30.8 pg (ref 27–31.2)
MCHC: 33.1 g/dL (ref 31.8–35.4)
MCV: 92.9 fL (ref 80–97)
MPV: 8.3 fL (ref 0–99.8)
POC Granulocyte: 5.1 (ref 2–6.9)
POC LYMPH PERCENT: 25.4 %L (ref 10–50)
Platelet Count, POC: 237 10*3/uL (ref 142–424)
RBC: 4.5 M/uL — AB (ref 4.69–6.13)
RDW, POC: 14.1 %
WBC: 7.3 10*3/uL (ref 4.6–10.2)

## 2014-04-01 LAB — POCT UA - MICROSCOPIC ONLY
BACTERIA, U MICROSCOPIC: NEGATIVE
Casts, Ur, LPF, POC: NEGATIVE
Crystals, Ur, HPF, POC: NEGATIVE
RBC, urine, microscopic: NEGATIVE
YEAST UA: NEGATIVE

## 2014-04-01 LAB — POCT INR: INR: 2.1

## 2014-04-01 NOTE — Addendum Note (Signed)
Addended by: Pollyann Kennedy F on: 04/01/2014 12:47 PM   Modules accepted: Orders

## 2014-04-01 NOTE — Progress Notes (Signed)
Subjective:    Patient ID: [name], male    DOB: 1940-12-20, 73 y.o.   MRN: [id number]  HPI  Patient comes in today complaining with right side abdominal pain and nausea. Patient also states that he has some dizziness and his BP is low today.   Review of Systems  Constitutional: Negative.   HENT: Negative.   Eyes: Negative.   Respiratory: Negative.   Cardiovascular: Negative.   Gastrointestinal: Positive for nausea and abdominal pain.  Endocrine: Negative.   Genitourinary: Negative.   Musculoskeletal: Negative.   Skin: Negative.   Allergic/Immunologic: Negative.   Neurological: Positive for dizziness.  Hematological: Negative.   Psychiatric/Behavioral: Negative.    Patient Active Problem List   Diagnosis Date Noted  . 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 Chest pain 04/12/2013  . Midsternal chest pain 03/27/2013  . Ventral hernia 01/23/2013  . Angina pectoris 08/30/2012  . Chronic systolic CHF (congestive heart failure), NYHA class 2   . Chronic anticoagulation   . Atypical chest pain 03/11/2012  . Umbilical hernia 78/29/5621  . CAD (coronary artery disease) 10/20/2011  . Atrial fibrillation-Permanent 05/31/2011  . 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 Pacemaker-stJudes 05/31/2011  . Chest pain 04/11/2011  . Cough, persistent 11/15/2010  . Hx of CABG 09/23/2010  . Ischemic cardiomyopathy   . Hypertension   . Hyperlipidemia   . GERD (gastroesophageal reflux disease)   . Anxiety   . Fibromyalgia    Outpatient Encounter Prescriptions as of 04/01/2014  Medication Sig  . acetaminophen (TYLENOL) 500 MG tablet Take 500 mg by mouth every 8 (eight) hours as needed.  . ALPRAZolam (XANAX) 1 MG tablet Take 1 tablet (1 mg total) by mouth 3 (three) times daily as needed. For anxiety  . atorvastatin (LIPITOR) 40 MG tablet Take 1 tablet (40 mg total) by mouth as directed. 1 TABLET ALTERNATING WITH 1/2 TABLET  . carvedilol (COREG) 6.25 MG tablet Take 1  tablet (6.25 mg total) by mouth 2 (two) times daily.  . cyclobenzaprine (FLEXERIL) 5 MG tablet Take 1 tablet (5 mg total) by mouth 3 (three) times daily as needed for muscle spasms.  [name] Kitchen doxepin (SINEQUAN) 25 MG capsule 1 capsule every morning and 2 capsules at night  . gabapentin (NEURONTIN) 100 MG capsule Take 1 capsule (100 mg total) by mouth 4 (four) times daily.  . hydroxypropyl methylcellulose (ISOPTO TEARS) 2.5 % ophthalmic solution Place 1 drop into both eyes 4 (four) times daily as needed. Dry eyes  . isosorbide mononitrate (IMDUR) 60 MG 24 hr tablet Take 1 tablet (60 mg total) by mouth daily.  [name] Kitchen lisinopril (PRINIVIL,ZESTRIL) 5 MG tablet Take 1 tablet (5 mg total) by mouth 2 (two) times daily.  [name] Kitchen loratadine (CLARITIN) 10 MG tablet Take 10 mg by mouth daily.  . Multiple Vitamin (MULTIVITAMIN) tablet Take 1 tablet by mouth daily.    . nitroGLYCERIN (NITROSTAT) 0.4 MG SL tablet Place 1 tablet (0.4 mg total) under the tongue every 5 (five) minutes as needed. For chest pain  . nystatin (MYCOSTATIN) 100000 UNIT/ML suspension Take 5 mLs (500,000 Units total) by mouth 4 (four) times daily.  [name] Kitchen omeprazole (PRILOSEC) 20 MG capsule Take 1 capsule (20 mg total) by mouth 2 (two) times daily.  [name] Kitchen oxyCODONE-acetaminophen (PERCOCET) 5-325 MG per tablet Take 1 tablet by mouth every 6 (six) hours as needed for moderate pain.  . polyethylene glycol (MIRALAX / GLYCOLAX) packet Take 13 g by mouth daily.  [name] Kitchen warfarin (COUMADIN) 5 MG tablet Take  1 tablet (5 mg total) by mouth at bedtime.       Objective:   Physical Exam  Constitutional: He is oriented to person, place, and time. He appears well-developed and well-nourished. No distress.  HENT:  Head: Normocephalic and atraumatic.  Right Ear: External ear normal.  Left Ear: External ear normal.  Nose: Nose normal.  Mouth/Throat: Oropharynx is clear and moist. No oropharyngeal exudate.  Eyes: Conjunctivae and EOM are normal. Pupils are equal, round, and  reactive to light. Right eye exhibits no discharge. Left eye exhibits no discharge. No scleral icterus.  Neck: Normal range of motion. Neck supple. No thyromegaly present.  Cardiovascular: Normal rate, regular rhythm and normal heart sounds.  Exam reveals no gallop and no friction rub.   No murmur heard. The heart was actually regular today and rhythm at 72/m  Pulmonary/Chest: Effort normal and breath sounds normal. No respiratory distress. He has no wheezes. He has no rales. He exhibits no tenderness.  The lungs were clear anteriorly and posteriorly  Abdominal: Soft. Bowel sounds are normal. He exhibits no mass. There is no tenderness. There is no rebound and no guarding.  There was slight tenderness in the right side of the abdomen. Bowel sounds were normal.  Musculoskeletal: Normal range of motion. He exhibits no edema.  Lymphadenopathy:    He has no cervical adenopathy.  Neurological: He is alert and oriented to person, place, and time.  Skin: Skin is warm and dry. No rash noted. No erythema. No pallor.  Psychiatric: He has a normal mood and affect. His behavior is normal. Judgment and thought content normal.  Nursing note and vitals reviewed.   BP 84/51 mmHg  Pulse 67  Temp(Src) 97 F (36.1 C) (Oral)  Ht 5\' 10"  (1.778 m)  Wt 184 lb 6.4 oz (83.643 kg)  BMI 26.46 kg/m2       Assessment & Plan:  1. Right lower quadrant abdominal pain - POCT CBC - POCT UA - Microscopic Only - POCT urinalysis dipstick - Urine culture  2. Nausea without vomiting - POCT CBC - POCT UA - Microscopic Only - POCT urinalysis dipstick - Urine culture  3. Viral syndrome  4. Hypotension, unspecified hypotension type  5. Dizziness and giddiness  Patient Instructions  DECREASE LISINOPRIL TO ONE A DAY (instead of 2 a day) We will arrange a CT of your abdomen- and call you We will call you with lab and urine results when they are back  Clear liquids for 24 hours (like 7-Up, ginger ale, Sprite,  Jello, frozen pops) Full liquids the second 24-hours (like potato soup, tomato soup, chicken noodle soup) Bland diet the third 24-hours (boiled and baked foods, no fried or greasy foods) Avoid milk, cheese, ice cream and dairy products for 72 hours. Avoid caffeine (cola drinks, coffee, tea, Mountain Dew, Mellow Yellow) Take in small amounts, but frequently. Tylenol as needed for aches pains and fever    Arrie Senate MD

## 2014-04-01 NOTE — Patient Instructions (Addendum)
DECREASE LISINOPRIL TO ONE A DAY (instead of 2 a day) We will arrange a CT of your abdomen- and call you We will call you with lab and urine results when they are back  Clear liquids for 24 hours (like 7-Up, ginger ale, Sprite, Jello, frozen pops) Full liquids the second 24-hours (like potato soup, tomato soup, chicken noodle soup) Bland diet the third 24-hours (boiled and baked foods, no fried or greasy foods) Avoid milk, cheese, ice cream and dairy products for 72 hours. Avoid caffeine (cola drinks, coffee, tea, Mountain Dew, Mellow Yellow) Take in small amounts, but frequently. Tylenol as needed for aches pains and fever

## 2014-04-01 NOTE — Addendum Note (Signed)
Addended by: Pollyann Kennedy F on: 04/01/2014 12:08 PM   Modules accepted: Orders

## 2014-04-02 ENCOUNTER — Ambulatory Visit (INDEPENDENT_AMBULATORY_CARE_PROVIDER_SITE_OTHER): Payer: Medicare Other | Admitting: Vascular Surgery

## 2014-04-02 ENCOUNTER — Telehealth: Payer: Self-pay | Admitting: Family Medicine

## 2014-04-02 ENCOUNTER — Encounter: Payer: Self-pay | Admitting: Vascular Surgery

## 2014-04-02 ENCOUNTER — Other Ambulatory Visit: Payer: Self-pay | Admitting: Vascular Surgery

## 2014-04-02 ENCOUNTER — Ambulatory Visit (HOSPITAL_COMMUNITY)
Admission: RE | Admit: 2014-04-02 | Discharge: 2014-04-02 | Disposition: A | Discharge status: Home / Self Care | Payer: Medicare Other | Source: Ambulatory Visit | Attending: Vascular Surgery | Admitting: Vascular Surgery

## 2014-04-02 VITALS — BP 135/69 | HR 64 | Ht 70.0 in | Wt 178.0 lb

## 2014-04-02 DIAGNOSIS — I70219 Atherosclerosis of native arteries of extremities with intermittent claudication, unspecified extremity: Secondary | ICD-10-CM

## 2014-04-02 DIAGNOSIS — I739 Peripheral vascular disease, unspecified: Secondary | ICD-10-CM | POA: Diagnosis not present

## 2014-04-02 LAB — URINE CULTURE

## 2014-04-02 NOTE — Telephone Encounter (Signed)
Patient states that his BP is 100/69 and patient is still having some pain. Patient advised that I recommend that he have someone drive him today to [facility] since his BP is still low and having some pain. Patient agreed and verbalized understanding.

## 2014-04-02 NOTE — Progress Notes (Signed)
Patient ID: [name], male   DOB: 07-15-1940, 73 y.o.   MRN: [id number]  Reason for Consult: New Evaluation   Referred by Chipper Herb, MD  Subjective:     HPI:  [name] is a 73 y.o. male who was sent for vascular evaluation. I reviewed his records from [employer]. He was evaluated on 03/19/2014 with bilateral lower extremity discomfort. He had evidence of peripheral vascular disease. He had a study performed in September of this year which showed biphasic Doppler signals in the right foot with an ABI of greater than 100%. On the left side he had monophasic signals in the posterior tibial and dorsalis pedis positions with an ABI of 86%. Toe pressure on the right was 112 mmHg. Toe pressure on the left was 84 mmHg.  I do not get any clear-cut history of claudication in either lower extremity. He walks on the treadmill for a mile without much difficulty. Likewise, I do not get any history of rest pain or nonhealing ulcers.  His risk factors for vascular disease include hypertension and hypercholesterolemia. He denies any history of diabetes, family history of premature cardiovascular disease, or history of tobacco use  Past Medical History  Diagnosis Date  . Ischemic cardiomyopathy     a. 06/2012 EF 25% by echo.  . Hypertension   . Hyperlipidemia   . GERD (gastroesophageal reflux disease)   . CAD (coronary artery disease)     a. 1991 s/p CABGx4;  b. redo CABG in 2007 (SVG->OM, VG->PDA->PLV);  c. 01/2011 Cath: LM 80ost, LAD 100p, LCX 100ost, OM1 100, RCA 100, LIMA->LAD ok, VG->PDA ok, VG->OM ok;  d. 03/2012 MV: EF 32% inf & inflat scar w/o ischemia; e. Lexi MV: EF 33%, inf infarct, no ischemia.  . Atrial flutter     a. 01/2011 s/p unsuccessful RFCA complicated by CHB req PPM.  . CHB (complete heart block)     a. 01/2011: in setting of RFCA, s/p SJM 2110 [employer] DC PPM, ser # [id number].  [name] Kitchen Kidney cysts     a. bilateral  . Hiatal hernia     a. 03/01/2013  EGD and esoph dil.  [name] Kitchen BPH (benign prostatic hyperplasia)     a. elevated PSA  . A-fib     a. chronic coumadin  . Elevated PSA, less than 10 ng/ml   . Fibromyalgia   . Skin cancer     'above left eyebrow; tip of my nose" (08/29/2012)  . Depression     "since 1986-1987" (08/29/2012)  . Anxiety   . Panic attacks   . Chronic systolic CHF (congestive heart failure), NYHA class 2     a. 06/2012 Echo: EF 25%, distal sept and apical AK, sev dil LV, mild LVH, mod dil LA.   Family History  Problem Relation Age of Onset  . Stroke Mother   . Heart attack Father     died @ 53, mult MI's.  [name] Kitchen Heart disease Father   . Hypertension Father   . Diabetes Sister    Past Surgical History  Procedure Laterality Date  . Coronary artery bypass graft  1991    CABG X 5  . Coronary artery bypass graft  06/2005    CABG X 3  . Cardiac catheterization  10/30/2009    Grafts patent. Mild to moderate LV dysfunction. EF 45%. Managed medically.   . Cardiac catheterization      multiple  . Tonsillectomy  1960  . Cholecystectomy  2007  .  Insert / replace / remove pacemaker  01/2011    initial placement  . Inguinal hernia repair  1960's    "left I think"  . Cataract extraction w/ intraocular lens  implant, bilateral  2012  . Cardioversion  05/25/2012    Procedure: CARDIOVERSION;  Surgeon: Darlin Coco, MD;  Location: [facility] ENDOSCOPY;  Service: Cardiovascular;  Laterality: N/A;  . Humerus surgery Left 1960    "growth on the bone taken off; not cancer" (08/29/2012)  . Excisional hemorrhoidectomy  ~ 1980  . Skin cancer excision      'above left eyebrow; tip of my nose" (08/29/2012)  . Atrial flutter ablation  01/2011    "didn't work" (08/29/2012)   Short Social History:  History  Substance Use Topics  . Smoking status: Former Smoker -- 0.50 packs/day for 10 years    Types: Cigarettes    Quit date: 09/21/1973  . Smokeless tobacco: Never Used  . Alcohol Use: No    Allergies  Allergen Reactions  . Sulfa  Drugs Cross Reactors Other (See Comments)    unknown  . Ultram [Tramadol Hcl] Itching    "don't remember how bad"  . Xarelto [Rivaroxaban] Other (See Comments)    Itching   . Zithromax [Azithromycin]     Current Outpatient Prescriptions  Medication Sig Dispense Refill  . acetaminophen (TYLENOL) 500 MG tablet Take 500 mg by mouth every 8 (eight) hours as needed.    . ALPRAZolam (XANAX) 1 MG tablet Take 1 tablet (1 mg total) by mouth 3 (three) times daily as needed. For anxiety 270 tablet 1  . atorvastatin (LIPITOR) 40 MG tablet TAKE 1 BY MOUTH ALTERNATING WITH 1/2 TABLET AS DIRECTED 135 tablet 0  . carvedilol (COREG) 6.25 MG tablet Take 1 tablet (6.25 mg total) by mouth 2 (two) times daily. 180 tablet 3  . cyclobenzaprine (FLEXERIL) 5 MG tablet Take 1 tablet (5 mg total) by mouth 3 (three) times daily as needed for muscle spasms. 30 tablet 0  . doxepin (SINEQUAN) 25 MG capsule 1 capsule every morning and 2 capsules at night 270 capsule 3  . gabapentin (NEURONTIN) 100 MG capsule TAKE 1 BY MOUTH 4 TIMES DAILY 360 capsule 0  . hydroxypropyl methylcellulose (ISOPTO TEARS) 2.5 % ophthalmic solution Place 1 drop into both eyes 4 (four) times daily as needed. Dry eyes    . isosorbide mononitrate (IMDUR) 60 MG 24 hr tablet TAKE 1 BY MOUTH DAILY 90 tablet 0  . lisinopril (PRINIVIL,ZESTRIL) 5 MG tablet Take 1 tablet (5 mg total) by mouth 2 (two) times daily. 180 tablet 3  . loratadine (CLARITIN) 10 MG tablet Take 10 mg by mouth daily.    . Multiple Vitamin (MULTIVITAMIN) tablet Take 1 tablet by mouth daily.      . nitroGLYCERIN (NITROSTAT) 0.4 MG SL tablet Place 1 tablet (0.4 mg total) under the tongue every 5 (five) minutes as needed. For chest pain 100 tablet prn  . nystatin (MYCOSTATIN) 100000 UNIT/ML suspension Take 5 mLs (500,000 Units total) by mouth 4 (four) times daily. 60 mL 0  . omeprazole (PRILOSEC) 20 MG capsule Take 1 capsule (20 mg total) by mouth 2 (two) times daily. 180 capsule 3  .  oxyCODONE-acetaminophen (PERCOCET) 5-325 MG per tablet Take 1 tablet by mouth every 6 (six) hours as needed for moderate pain. 20 tablet 0  . polyethylene glycol (MIRALAX / GLYCOLAX) packet Take 13 g by mouth daily.    [name] Kitchen warfarin (COUMADIN) 5 MG tablet Take 1 tablet (5  mg total) by mouth at bedtime. 90 tablet 2   No current facility-administered medications for this visit.    Review of Systems  Constitutional: Negative for chills and fever.  Eyes: Negative for loss of vision.  Respiratory: Negative for cough and wheezing.  Cardiovascular: Positive for claudication. Negative for chest pain, chest tightness, dyspnea with exertion, orthopnea and palpitations.  GI: Negative for blood in stool and vomiting.  GU: Negative for dysuria and hematuria.  Musculoskeletal: Negative for leg pain, joint pain and myalgias.  Skin: Negative for rash and wound.  Neurological: Negative for dizziness and speech difficulty.  Hematologic: Negative for bruises/bleeds easily. Psychiatric: Negative for depressed mood.        Objective:  Objective  Filed Vitals:   04/02/14 1438  BP: 135/69  Pulse: 64  Height: 5\' 10"  (1.778 m)  Weight: 178 lb (80.74 kg)  SpO2: 100%   Body mass index is 25.54 kg/(m^2).  Physical Exam  Constitutional: He is oriented to person, place, and time. He appears well-developed and well-nourished.  HENT:  Head: Normocephalic and atraumatic.  Neck: Neck supple. No JVD present. No thyromegaly present.  Cardiovascular: Normal rate, regular rhythm and normal heart sounds.  Exam reveals no friction rub.   No murmur heard. Pulses:      Femoral pulses are 2+ on the right side, and 2+ on the left side.      Dorsalis pedis pulses are 2+ on the right side, and 0 on the left side.  I do not detect carotid bruits  Pulmonary/Chest: Breath sounds normal. He has no wheezes. He has no rales.  Abdominal: Soft. Bowel sounds are normal. There is no tenderness.  I do not palpate an aneurysm.    Musculoskeletal: Normal range of motion. He exhibits no edema.  Lymphadenopathy:    He has no cervical adenopathy.  Neurological: He is alert and oriented to person, place, and time. He has normal strength. No sensory deficit.  Skin: No lesion and no rash noted.  Psychiatric: He has a normal mood and affect.    Data: I have independently interpreted the duplex in our office today which shows evidence of significant infrainguinal arterial occlusive disease on the left. The anterior tibial artery is occluded. He has moderate disease in his superficial femoral artery on the left.      Assessment/Plan:     PERIPHERAL VASCULAR DISEASE: Based on his exam and Doppler study, he has evidence of infrainguinal arterial occlusive disease on the left. However, he is essentially asymptomatic. He walks on the treadmill daily and can walk approximately a mile. I've encouraged him to stay as active as possible. He is not a smoker. He is followed by Dr. Redge Gainer and also Dr. Warren Danes so I do not think that he needs routine follow up in our office unless his symptoms progress. I would be happy to see him back at any time.   Angelia Mould MD [facility]

## 2014-04-03 ENCOUNTER — Telehealth: Payer: Self-pay | Admitting: Family Medicine

## 2014-04-03 ENCOUNTER — Other Ambulatory Visit: Payer: Self-pay

## 2014-04-03 DIAGNOSIS — R109 Unspecified abdominal pain: Principal | ICD-10-CM

## 2014-04-03 DIAGNOSIS — R102 Pelvic and perineal pain: Secondary | ICD-10-CM

## 2014-04-03 NOTE — Telephone Encounter (Signed)
Patient aware that referrals are working on his referral.

## 2014-04-04 ENCOUNTER — Ambulatory Visit
Admission: RE | Admit: 2014-04-04 | Discharge: 2014-04-04 | Disposition: A | Discharge status: Home / Self Care | Payer: Medicare Other | Source: Ambulatory Visit | Attending: Family Medicine | Admitting: Family Medicine

## 2014-04-04 ENCOUNTER — Telehealth: Payer: Self-pay

## 2014-04-04 ENCOUNTER — Other Ambulatory Visit: Payer: Self-pay

## 2014-04-04 DIAGNOSIS — R109 Unspecified abdominal pain: Principal | ICD-10-CM

## 2014-04-04 DIAGNOSIS — R102 Pelvic and perineal pain: Secondary | ICD-10-CM

## 2014-04-04 MED ORDER — ISOSORBIDE MONONITRATE ER 60 MG PO TB24: ORAL_TABLET | ORAL | Status: DC

## 2014-04-04 MED ORDER — ATORVASTATIN CALCIUM 40 MG PO TABS: ORAL_TABLET | ORAL | Status: DC

## 2014-04-04 NOTE — Telephone Encounter (Signed)
Okay to refill? 

## 2014-04-07 ENCOUNTER — Other Ambulatory Visit: Payer: Self-pay

## 2014-04-07 MED ORDER — GABAPENTIN 100 MG PO CAPS: ORAL_CAPSULE | ORAL | Status: DC

## 2014-04-08 ENCOUNTER — Other Ambulatory Visit: Payer: Self-pay

## 2014-04-08 ENCOUNTER — Encounter: Payer: Self-pay | Admitting: Internal Medicine

## 2014-04-08 ENCOUNTER — Ambulatory Visit (INDEPENDENT_AMBULATORY_CARE_PROVIDER_SITE_OTHER): Payer: Medicare Other | Admitting: Internal Medicine

## 2014-04-08 VITALS — BP 102/60 | HR 60 | Ht 71.0 in | Wt 181.4 lb

## 2014-04-08 DIAGNOSIS — I442 Atrioventricular block, complete: Secondary | ICD-10-CM

## 2014-04-08 DIAGNOSIS — Z45018 Encounter for adjustment and management of other part of cardiac pacemaker: Secondary | ICD-10-CM

## 2014-04-08 LAB — MDC_IDC_ENUM_SESS_TYPE_INCLINIC
Battery Remaining Longevity: 126 mo
Battery Voltage: 2.96 V
Date Time Interrogation Session: 20151208185658
Implantable Pulse Generator Model: 2110
Implantable Pulse Generator Serial Number: 7282780
Lead Channel Pacing Threshold Amplitude: 0.75 V
Lead Channel Pacing Threshold Amplitude: 0.875 V
Lead Channel Pacing Threshold Pulse Width: 0.4 ms
Lead Channel Pacing Threshold Pulse Width: 0.4 ms
Lead Channel Sensing Intrinsic Amplitude: 12 mV
Lead Channel Sensing Intrinsic Amplitude: 5 mV
Lead Channel Setting Pacing Amplitude: 2.5 V
Lead Channel Setting Pacing Pulse Width: 0.4 ms
Lead Channel Setting Sensing Sensitivity: 2 mV
MDC IDC MSMT LEADCHNL RA PACING THRESHOLD PULSEWIDTH: 0.4 ms
MDC IDC MSMT LEADCHNL RV IMPEDANCE VALUE: 475 Ohm
MDC IDC MSMT LEADCHNL RV PACING THRESHOLD AMPLITUDE: 0.75 V
MDC IDC STAT BRADY RA PERCENT PACED: 0 %
MDC IDC STAT BRADY RV PERCENT PACED: 99 %

## 2014-04-08 MED ORDER — ATORVASTATIN CALCIUM 40 MG PO TABS: ORAL_TABLET | ORAL | Status: DC

## 2014-04-08 NOTE — Patient Instructions (Signed)
Your physician has recommended you make the following change in your medication:  1) Take your Lisinopril at night  Remote monitoring is used to monitor your Pacemaker of ICD from home. This monitoring reduces the number of office visits required to check your device to one time per year. It allows [place] to keep an eye on the functioning of your device to ensure it is working properly. You are scheduled for a device check from home on 07/08/14. You may send your transmission at any time that day. If you have a wireless device, the transmission will be sent automatically. After your physician reviews your transmission, you will receive a postcard with your next transmission date.  Your physician wants you to follow-up in: 1 year with Dr. Caryl Comes.  You will receive a reminder letter in the mail two months in advance. If you don't receive a letter, please call our office to schedule the follow-up appointment.

## 2014-04-08 NOTE — Progress Notes (Signed)
Patient Care Team: Chipper Herb, MD as PCP - General (Family Medicine)   HPI  [name] is a 73 y.o. male Seen in followup for a pacemaker implanted following intermittent complete heart block associated with ablation of atrial flutter. He received a single RF lesion. No recovery of conduction and he underwent pacing the following day. This in 2012. He is on Coumadin and asa  ,The patient denies chest pain, shortness of breath, nocturnal dyspnea, orthopnea or peripheral edema.  There have been no palpitations, lightheadedness or syncope.    Is a history of ischemic heart disease with prior bypass surgery and redo bypass 2007. He underwent Myoview scanning 11/13 which demonstrated ejection fraction 32% inferolateral scar but no ischemia.   Past Medical History  Diagnosis Date  . Ischemic cardiomyopathy     a. 06/2012 EF 25% by echo.  . Hypertension   . Hyperlipidemia   . GERD (gastroesophageal reflux disease)   . CAD (coronary artery disease)     a. 1991 s/p CABGx4;  b. redo CABG in 2007 (SVG->OM, VG->PDA->PLV);  c. 01/2011 Cath: LM 80ost, LAD 100p, LCX 100ost, OM1 100, RCA 100, LIMA->LAD ok, VG->PDA ok, VG->OM ok;  d. 03/2012 MV: EF 32% inf & inflat scar w/o ischemia; e. Lexi MV: EF 33%, inf infarct, no ischemia.  . Atrial flutter     a. 01/2011 s/p unsuccessful RFCA complicated by CHB req PPM.  . CHB (complete heart block)     a. 01/2011: in setting of RFCA, s/p SJM 2110 [employer] DC PPM, ser # [id number].  [name] Kitchen Kidney cysts     a. bilateral  . Hiatal hernia     a. 03/01/2013 EGD and esoph dil.  [name] Kitchen BPH (benign prostatic hyperplasia)     a. elevated PSA  . A-fib     a. chronic coumadin  . Elevated PSA, less than 10 ng/ml   . Fibromyalgia   . Skin cancer     'above left eyebrow; tip of my nose" (08/29/2012)  . Depression     "since 1986-1987" (08/29/2012)  . Anxiety   . Panic attacks   . Chronic systolic CHF (congestive heart failure), NYHA class 2     a. 06/2012 Echo: EF  25%, distal sept and apical AK, sev dil LV, mild LVH, mod dil LA.    Past Surgical History  Procedure Laterality Date  . Coronary artery bypass graft  1991    CABG X 5  . Coronary artery bypass graft  06/2005    CABG X 3  . Cardiac catheterization  10/30/2009    Grafts patent. Mild to moderate LV dysfunction. EF 45%. Managed medically.   . Cardiac catheterization      multiple  . Tonsillectomy  1960  . Cholecystectomy  2007  . Insert / replace / remove pacemaker  01/2011    initial placement  . Inguinal hernia repair  1960's    "left I think"  . Cataract extraction w/ intraocular lens  implant, bilateral  2012  . Cardioversion  05/25/2012    Procedure: CARDIOVERSION;  Surgeon: Darlin Coco, MD;  Location: [facility] ENDOSCOPY;  Service: Cardiovascular;  Laterality: N/A;  . Humerus surgery Left 1960    "growth on the bone taken off; not cancer" (08/29/2012)  . Excisional hemorrhoidectomy  ~ 1980  . Skin cancer excision      'above left eyebrow; tip of my nose" (08/29/2012)  . Atrial flutter ablation  01/2011    "didn't work" (  08/29/2012)    Current Outpatient Prescriptions  Medication Sig Dispense Refill  . acetaminophen (TYLENOL) 500 MG tablet Take 500 mg by mouth every 8 (eight) hours as needed.    . ALPRAZolam (XANAX) 1 MG tablet Take 1 tablet (1 mg total) by mouth 3 (three) times daily as needed. For anxiety 270 tablet 1  . atorvastatin (LIPITOR) 40 MG tablet TAKE 1 BY MOUTH ALTERNATING WITH 1/2   TABLET EVERY OTHER DAY AS DIRECTED 135 tablet 1  . carvedilol (COREG) 6.25 MG tablet Take 1 tablet (6.25 mg total) by mouth 2 (two) times daily. 180 tablet 3  . doxepin (SINEQUAN) 25 MG capsule 1 capsule every morning and 2 capsules at night 270 capsule 3  . gabapentin (NEURONTIN) 100 MG capsule TAKE 1 BY MOUTH 4 TIMES DAILY 360 capsule 2  . hydroxypropyl methylcellulose (ISOPTO TEARS) 2.5 % ophthalmic solution Place 1 drop into both eyes 4 (four) times daily as needed. Dry eyes    .  isosorbide mononitrate (IMDUR) 60 MG 24 hr tablet TAKE 1 BY MOUTH DAILY 90 tablet 1  . lisinopril (PRINIVIL,ZESTRIL) 10 MG tablet Take 10 mg by mouth daily.    [name] Kitchen loratadine (CLARITIN) 10 MG tablet Take 10 mg by mouth daily.    . Multiple Vitamin (MULTIVITAMIN) tablet Take 1 tablet by mouth daily.      . nitroGLYCERIN (NITROSTAT) 0.4 MG SL tablet Place 1 tablet (0.4 mg total) under the tongue every 5 (five) minutes as needed. For chest pain 100 tablet prn  . omeprazole (PRILOSEC) 20 MG capsule Take 1 capsule (20 mg total) by mouth 2 (two) times daily. 180 capsule 3  . oxyCODONE-acetaminophen (PERCOCET) 5-325 MG per tablet Take 1 tablet by mouth every 6 (six) hours as needed for moderate pain. 20 tablet 0  . polyethylene glycol (MIRALAX / GLYCOLAX) packet Take 13 g by mouth daily.    [name] Kitchen warfarin (COUMADIN) 5 MG tablet Take 1 tablet (5 mg total) by mouth at bedtime. 90 tablet 2   No current facility-administered medications for this visit.    Allergies  Allergen Reactions  . Sulfa Drugs Cross Reactors Other (See Comments)    unknown  . Ultram [Tramadol Hcl] Itching    "don't remember how bad"  . Xarelto [Rivaroxaban] Other (See Comments)    Itching   . Zithromax [Azithromycin]     Itching     Review of Systems negative except from HPI and PMH  Physical Exam BP 102/60 mmHg  Pulse 60  Ht 5\' 11"  (1.803 m)  Wt 181 lb 6.4 oz (82.283 kg)  BMI 25.31 kg/m2 Well developed and well nourished in no acute distress HENT normal E scleral and icterus clear Neck Supple Clear to ausculation Device pocket well healed; without hematoma or erythema.  There is no tethering   Regular rate and rhythm  2/6 m Soft with active bowel sounds No clubbing cyanosis none Edema Alert and oriented, grossly normal motor and sensory function Skin Warm and Dry    Assessment and  Plan  ATrial fibrillation  Persistent  Hypotension  Complete heart block  Pacemaker St Jude The patient's device was  interrogated.  The information was reviewed. No changes were made in the programming.     He remains on warfarin. I will defer to Dr. TB discussion of NOACs. He's had problems with hypotension. I suggested that he take his lisinopril at night. He has already down  titrated the dose somewhat.

## 2014-04-09 ENCOUNTER — Telehealth: Payer: Self-pay | Admitting: Family Medicine

## 2014-04-09 NOTE — Telephone Encounter (Signed)
Appointment canceled with [name] today and patient will call back to reschedule tomorrow after he sees urologist. Patient will go to ER if unable to urinate.

## 2014-04-10 ENCOUNTER — Ambulatory Visit: Payer: Medicare Other | Admitting: Family Medicine

## 2014-04-15 ENCOUNTER — Encounter: Payer: Self-pay | Admitting: Internal Medicine

## 2014-04-16 ENCOUNTER — Other Ambulatory Visit: Payer: Medicare Other

## 2014-04-16 ENCOUNTER — Ambulatory Visit (INDEPENDENT_AMBULATORY_CARE_PROVIDER_SITE_OTHER): Payer: Medicare Other | Admitting: Cardiology

## 2014-04-16 ENCOUNTER — Encounter: Payer: Self-pay | Admitting: Cardiology

## 2014-04-16 VITALS — BP 114/70 | HR 60 | Ht 71.0 in | Wt 179.8 lb

## 2014-04-16 DIAGNOSIS — I4819 Other persistent atrial fibrillation: Secondary | ICD-10-CM

## 2014-04-16 DIAGNOSIS — I2510 Atherosclerotic heart disease of native coronary artery without angina pectoris: Secondary | ICD-10-CM

## 2014-04-16 DIAGNOSIS — I119 Hypertensive heart disease without heart failure: Secondary | ICD-10-CM

## 2014-04-16 DIAGNOSIS — I481 Persistent atrial fibrillation: Secondary | ICD-10-CM

## 2014-04-16 DIAGNOSIS — I259 Chronic ischemic heart disease, unspecified: Secondary | ICD-10-CM

## 2014-04-16 DIAGNOSIS — I2583 Coronary atherosclerosis due to lipid rich plaque: Secondary | ICD-10-CM

## 2014-04-16 DIAGNOSIS — E785 Hyperlipidemia, unspecified: Secondary | ICD-10-CM

## 2014-04-16 DIAGNOSIS — I255 Ischemic cardiomyopathy: Secondary | ICD-10-CM

## 2014-04-16 LAB — BASIC METABOLIC PANEL
BUN: 14 mg/dL (ref 6–23)
CHLORIDE: 97 meq/L (ref 96–112)
CO2: 28 mEq/L (ref 19–32)
Calcium: 8.8 mg/dL (ref 8.4–10.5)
Creatinine, Ser: 1 mg/dL (ref 0.4–1.5)
GFR: 76.83 mL/min (ref >60.00)
Glucose, Bld: 88 mg/dL (ref 70–99)
POTASSIUM: 5 meq/L (ref 3.5–5.1)
Sodium: 131 mEq/L — ABNORMAL LOW (ref 135–145)

## 2014-04-16 NOTE — Assessment & Plan Note (Signed)
The patient has had very little chest discomfort.  He has not had to take any recent sublingual nitroglycerin.  He has not been using his treadmill lately because the treadmill is in a room that is cluttered with clothes etc. in the place of using his treadmill, he is walking back and forth from one end of the house to the other for exercise

## 2014-04-16 NOTE — Assessment & Plan Note (Signed)
The patient has a functioning ventricular pacemaker.  He is not aware of his heart rhythm.  He has had no TIA or stroke symptoms.

## 2014-04-16 NOTE — Patient Instructions (Signed)
Your physician recommends that you continue on your current medications as directed. Please refer to the Current Medication list given to you today.  Your physician recommends that you schedule a follow-up appointment in: 3 month ov/ekg/bmet

## 2014-04-16 NOTE — Progress Notes (Signed)
[name] Date of Birth:  [birth year] [place] [address] [place], [place]  [address] [phone]        Fax   [phone]   History of Present Illness: This pleasant 73 year old gentleman is seen for a scheduled followup office visit.  He has a history of significant ischemic heart disease.[name] Kitchen He was admitted to [facility] on 03/09/12 and discharged on 03/11/12. He was admitted after he complained of having some chest discomfort at the time of a routine pacemaker check up. He was found to be in atrial fibrillation at the time of admission. On the date of discharge 11//13 the patient underwent a Lexus scan Myoview stress test which revealed left ventricular ejection fraction 32% with septal and inferior wall motion abnormalities and with an inferior lateral scar but no evidence of reversible ischemia. His prior ejection fraction at the time of catheter in 2012 was approximately 40%. 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 His last cardiac catheterization in October 2012 showed that his grafts were patent. The patient also has a history of hypercholesterolemia and essential hypertension. He has had a prior history of paroxysmal atrial flutter with ablation therapy and subsequent heart block which required a [employer] Jude pacemaker which was implanted in 2012. The patient is on long-term Coumadin. He has a lot of anxiety. On 05/25/12 the patient underwent elective cardioversion which was successful. Normal sinus rhythm and was returned as was confirmed by intracardiac electrogram via his pacemaker. However, within 24 hours after the cardioversion he felt that his pulse was irregular again. EKG confirmed that he was back in atrial flutter fibrillation. He was essentially asymptomatic and states that the arrhythmia doesn't really bother him much. Strategy is rate control and long-term anticoagulation. He is intolerant of Xarelto because of itching  and is back on long-term Coumadin. The patient was readmitted to the hospital in November 2014 for chest pain. He underwent another nuclear stress test on 03/27/13. This showed an ejection fraction of 33% and showed an inferior wall scar but no reversible ischemia.  His most recent echocardiogram on 12/13/13 showed an ejection fraction of 30% Since last visit he has had occasional chest discomfort.   Since we last saw him he has seen Dr. Gae Gallop regarding questionable leg claudication.  Dr. Doren Custard did not feel that he required any further intervention.  He was encouraged to continue to walk.  Previous abdominal CT scan in April 2015 showed extensive atherosclerotic involvement of the aorta and iliac branches.  Current Outpatient Prescriptions  Medication Sig Dispense Refill  . acetaminophen (TYLENOL) 500 MG tablet Take 500 mg by mouth every 8 (eight) hours as needed.    . ALPRAZolam (XANAX) 1 MG tablet Take 1 tablet (1 mg total) by mouth 3 (three) times daily as needed. For anxiety 270 tablet 1  . atorvastatin (LIPITOR) 40 MG tablet TAKE 1 BY MOUTH ALTERNATING WITH 1/2   TABLET EVERY OTHER DAY AS DIRECTED 135 tablet 1  . carvedilol (COREG) 6.25 MG tablet Take 1 tablet (6.25 mg total) by mouth 2 (two) times daily. 180 tablet 3  . doxepin (SINEQUAN) 25 MG capsule 1 capsule every morning and 2 capsules at night 270 capsule 3  . gabapentin (NEURONTIN) 100 MG capsule TAKE 1 BY MOUTH 4 TIMES DAILY 360 capsule 2  . hydroxypropyl methylcellulose (ISOPTO TEARS) 2.5 % ophthalmic solution Place 1 drop into both eyes 4 (four) times  daily as needed. Dry eyes    . isosorbide mononitrate (IMDUR) 60 MG 24 hr tablet TAKE 1 BY MOUTH DAILY 90 tablet 1  . lisinopril (PRINIVIL,ZESTRIL) 5 MG tablet Take 5 mg by mouth at bedtime.  2  . loratadine (CLARITIN) 10 MG tablet Take 10 mg by mouth daily.    . Multiple Vitamin (MULTIVITAMIN) tablet Take 1 tablet by mouth daily.      . nitroGLYCERIN (NITROSTAT) 0.4 MG SL tablet  Place 1 tablet (0.4 mg total) under the tongue every 5 (five) minutes as needed. For chest pain 100 tablet prn  . omeprazole (PRILOSEC) 20 MG capsule Take 1 capsule (20 mg total) by mouth 2 (two) times daily. 180 capsule 3  . oxyCODONE-acetaminophen (PERCOCET) 5-325 MG per tablet Take 1 tablet by mouth every 6 (six) hours as needed for moderate pain. 20 tablet 0  . polyethylene glycol (MIRALAX / GLYCOLAX) packet Take 13 g by mouth daily.    . tamsulosin (FLOMAX) 0.4 MG CAPS capsule Take 0.4 mg by mouth daily. 0  0  . warfarin (COUMADIN) 5 MG tablet Take 1 tablet (5 mg total) by mouth at bedtime. 90 tablet 2  . lisinopril (PRINIVIL,ZESTRIL) 10 MG tablet Take 10 mg by mouth at bedtime.     No current facility-administered medications for this visit.    Allergies  Allergen Reactions  . Sulfa Drugs Cross Reactors Other (See Comments)    unknown  . Ultram [Tramadol Hcl] Itching    "don't remember how bad"  . Xarelto [Rivaroxaban] Other (See Comments)    Itching   . Zithromax [Azithromycin]     Itching     Patient Active Problem List   Diagnosis Date Noted  . Cough, persistent 11/15/2010    Priority: High  . Hx of CABG 09/23/2010    Priority: High  . Hypertension     Priority: High  . Anxiety     Priority: High  . Hyperlipidemia     Priority: Medium  . 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 Chest pain 04/12/2013  . Midsternal chest pain 03/27/2013  . Ventral hernia 01/23/2013  . Angina pectoris 08/30/2012  . Chronic systolic CHF (congestive heart failure), NYHA class 2   . Chronic anticoagulation   . Atypical chest pain 03/11/2012  . Umbilical hernia 94/50/3888  . CAD (coronary artery disease) 10/20/2011  . Atrial fibrillation-Permanent 05/31/2011  . 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 Pacemaker-stJudes 05/31/2011  . Chest pain 04/11/2011  . Ischemic cardiomyopathy   . GERD (gastroesophageal reflux disease)   . Fibromyalgia     History  Smoking  status  . Former Smoker -- 0.50 packs/day for 10 years  . Types: Cigarettes  . Quit date: 09/21/1973  Smokeless tobacco  . Never Used    History  Alcohol Use No    Family History  Problem Relation Age of Onset  . Stroke Mother   . Heart attack Father     died @ 86, mult MI's.  [name] Kitchen Heart disease Father   . Hypertension Father   . Diabetes Sister     Review of Systems: Constitutional: no fever chills diaphoresis or fatigue or change in weight.  Head and neck: no hearing loss, no epistaxis, no photophobia or visual disturbance. Respiratory: No cough, shortness of breath or wheezing. Cardiovascular: No chest pain peripheral edema, palpitations. Gastrointestinal: No abdominal distention, no abdominal pain, no change in bowel habits hematochezia or melena. Genitourinary: No dysuria, no frequency, no urgency, no nocturia. Musculoskeletal:No  arthralgias, no back pain, no gait disturbance or myalgias. Neurological: No dizziness, no headaches, no numbness, no seizures, no syncope, no weakness, no tremors. Hematologic: No lymphadenopathy, no easy bruising. Psychiatric: No confusion, no hallucinations, no sleep disturbance.    Physical Exam: Filed Vitals:   04/16/14 1051  BP: 114/70  Pulse: 60   the general appearance reveals a well-developed well-nourished somewhat anxious gentleman in no acute distress.The head and neck exam reveals pupils equal and reactive.  Extraocular movements are full.  There is no scleral icterus.  The mouth and pharynx are normal.  The neck is supple.  The carotids reveal no bruits.  The jugular venous pressure is normal.  The  thyroid is not enlarged.  There is no lymphadenopathy.  The chest is clear to percussion and auscultation.  There are no rales or rhonchi.  Expansion of the chest is symmetrical.  The precordium is quiet.  The first heart sound is normal.  The second heart sound is physiologically split.  There is a grade 2/6 systolic ejection murmur at  the base. There is no abnormal lift or heave.  The abdomen is soft and nontender.  The bowel sounds are normal.  The liver and spleen are not enlarged.  There are no abdominal masses.  There are no abdominal bruits.  Extremities reveal no femoral bruits.  Femoral pulses are weak.  Pedal pulses are equivocal.  There is no phlebitis or edema.  There is no cyanosis or clubbing.  Strength is normal and symmetrical in all extremities.  There is no lateralizing weakness.  There are no sensory deficits.  The skin is warm and dry.  There is no rash.  There is a infected tick bite on the right posterior thorax.     Assessment / Plan: 1.  Ischemic heart disease status post CABG remotely and status post redo CABG in 2007. 2. Hyperlipidemia 3. hypertensive heart disease without heart failure 4. Anxiety 5. permanent atrial fibrillation with functioning ventricular pacemaker. 6. Dyspnea.  Most recent ejection fraction by echo on 12/13/13 is 30%, improved from 86% 7. systolic heart murmur at aortic area.  Echo shows aortic valve sclerosis and mild mitral regurgitation 8.  PAD  Plan: Continue current medication.  He is on very low doses of carvedilol and lisinopril for his left ventricular systolic dysfunction.  He was not able to tolerate higher doses of either because of low blood pressure Lab work today pending.  Recheck in 3 months for office visit and basal metabolic panel.

## 2014-04-16 NOTE — Assessment & Plan Note (Signed)
At his last visit we asked him to increase his carvedilol to 12.5 mg twice a day.  However he did not tolerate the higher dose and went back to the previous dose of 6.25 twice a day.  Likewise, a recent blood pressure at Dr. Tawanna Sat office was low and his lisinopril had to be cut back from 10 mg to 5 mg daily.

## 2014-04-17 NOTE — Progress Notes (Signed)
Quick Note:  Please report to patient. The recent labs are stable. Continue same medication and careful diet. ______ 

## 2014-04-18 ENCOUNTER — Other Ambulatory Visit (HOSPITAL_COMMUNITY): Payer: Medicare Other

## 2014-04-18 ENCOUNTER — Encounter: Payer: Medicare Other | Admitting: Vascular Surgery

## 2014-05-06 ENCOUNTER — Encounter: Payer: Self-pay | Admitting: Pharmacist Clinician (PhC)/ Clinical Pharmacy Specialist

## 2014-05-06 ENCOUNTER — Ambulatory Visit (INDEPENDENT_AMBULATORY_CARE_PROVIDER_SITE_OTHER): Payer: Medicare Other | Admitting: Pharmacist Clinician (PhC)/ Clinical Pharmacy Specialist

## 2014-05-06 DIAGNOSIS — I4891 Unspecified atrial fibrillation: Secondary | ICD-10-CM

## 2014-05-06 LAB — POCT INR: INR: 2.3

## 2014-05-12 DIAGNOSIS — N401 Enlarged prostate with lower urinary tract symptoms: Secondary | ICD-10-CM | POA: Insufficient documentation

## 2014-05-21 ENCOUNTER — Telehealth: Payer: Self-pay | Admitting: Cardiology

## 2014-05-21 NOTE — Telephone Encounter (Signed)
Patient phoned c/o palpitations/irregular heart rate.  Stated his heart would feel like is was going fast then slow.  Patient has persistent  Afib and is on Warfarin therapy.  Patient stated he did have chest pain on his sternum that was sore to touch, took NTG and didn't notice a difference.  Patient stated he does have this pain sore to touch often and has discussed with  Dr. Mare Ferrari  No other symptoms at this time  Patient current blood pressure 106/60 HR 61 and 113/56 HR 63. Will have patient do a remote check tonight and follow up tomorrow Advised if worse to go to ED,, verbalized understanding.

## 2014-05-21 NOTE — Telephone Encounter (Signed)
New Msg        Patient c/o Palpitations:  High priority if patient c/o lightheadedness and shortness of breath.  1. How long have you been having palpitations? 3-4 days   2. Are you currently experiencing lightheadedness and shortness of breath? No SOB no lightheadedness  3. Have you checked your BP and heart rate? (document readings) Yes last reading rt 144/88 87 HR  left arm 139/68 87  HR 67  4. Are you experiencing any other symptoms? No other symptoms   Pt would like a call back.

## 2014-05-22 NOTE — Telephone Encounter (Signed)
Spoke with patient and explained patient nothing showed up on remote check  Will discuss further with  Dr. Mare Ferrari next week

## 2014-05-29 ENCOUNTER — Encounter: Payer: Self-pay | Admitting: Cardiology

## 2014-05-29 ENCOUNTER — Ambulatory Visit (INDEPENDENT_AMBULATORY_CARE_PROVIDER_SITE_OTHER): Payer: Medicare Other | Admitting: Cardiology

## 2014-05-29 ENCOUNTER — Telehealth: Payer: Self-pay | Admitting: Cardiology

## 2014-05-29 VITALS — BP 130/78 | HR 68 | Ht 71.0 in | Wt 178.8 lb

## 2014-05-29 DIAGNOSIS — I482 Chronic atrial fibrillation, unspecified: Secondary | ICD-10-CM

## 2014-05-29 DIAGNOSIS — R079 Chest pain, unspecified: Secondary | ICD-10-CM

## 2014-05-29 DIAGNOSIS — I259 Chronic ischemic heart disease, unspecified: Secondary | ICD-10-CM

## 2014-05-29 NOTE — Telephone Encounter (Signed)
Patient seeing  Dr. Mare Ferrari for ov this afternoon

## 2014-05-29 NOTE — Telephone Encounter (Signed)
Spoke with patient and he will see  Dr. Mare Ferrari this after at 3:30

## 2014-05-29 NOTE — Progress Notes (Signed)
Cardiology Office Note   Date:  05/29/2014   ID:  [name], DOB 10/25/1940, MRN [id number]  PCP:  Redge Gainer, MD  Cardiologist:   Darlin Coco, MD   Chief Complaint  Patient presents with  . Chest Pain      History of Present Illness: [name] is a 74 y.o. male who presents for a work in office visit  This pleasant 74 year old gentleman is seen for a scheduled followup office visit. He has a history of significant ischemic heart disease.[name] He was admitted to [facility] on 03/09/12 and discharged on 03/11/12. He was admitted after he complained of having some chest discomfort at the time of a routine pacemaker check up. He was found to be in atrial fibrillation at the time of admission. On the date of discharge 11//13 the patient underwent a Lexus scan Myoview stress test which revealed left ventricular ejection fraction 32% with septal and inferior wall motion abnormalities and with an inferior lateral scar but no evidence of reversible ischemia.  Most recent Myoview 03/27/13 showed a fixed inferoapical defect and no reversible ischemia and the ejection fraction was 33%. His prior ejection fraction at the time of catheter in 2012 was approximately 40%. 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 His last cardiac catheterization in October 2012 showed that his grafts were patent. The patient also has a history of hypercholesterolemia and essential hypertension. He has had a prior history of paroxysmal atrial flutter with ablation therapy and subsequent heart block which required a St. Jude pacemaker which was implanted in 2012. The patient is on long-term Coumadin. He has a lot of anxiety. On 05/25/12 the patient underwent elective cardioversion which was successful. Normal sinus rhythm and was returned as was confirmed by intracardiac electrogram via his pacemaker. However, within 24 hours after the cardioversion he felt that his pulse was  irregular again. EKG confirmed that he was back in atrial flutter fibrillation. He was essentially asymptomatic and states that the arrhythmia doesn't really bother him much. Strategy is rate control and long-term anticoagulation. He is intolerant of Xarelto because of itching and is back on long-term Coumadin. The patient was readmitted to the hospital in November 2014 for chest pain. He underwent another nuclear stress test on 03/27/13. This showed an ejection fraction of 33% and showed an inferior wall scar but no reversible ischemia. His most recent echocardiogram on 12/13/13 showed an ejection fraction of 30% Since last visit he has had occasional chest discomfort.  Since we last saw him he has seen Dr. Gae Gallop regarding questionable leg claudication. Dr. Doren Custard did not feel that he required any further intervention. He was encouraged to continue to walk. Previous abdominal CT scan in April 2015 showed extensive atherosclerotic involvement of the aorta and iliac branches. He comes in today after having had some chest tightness which occurred about 10 minutes after he finished walking in his house.  This was the first time he had walked in about a week.  He did not have any discomfort while he was actually walking.  Past Medical History  Diagnosis Date  . Ischemic cardiomyopathy     a. 06/2012 EF 25% by echo.  . Hypertension   . Hyperlipidemia   . GERD (gastroesophageal reflux disease)   . CAD (coronary artery disease)     a. 1991 s/p CABGx4;  b. redo CABG in 2007 (SVG->OM, VG->PDA->PLV);  c. 01/2011 Cath: LM 80ost, LAD 100p, LCX  100ost, OM1 100, RCA 100, LIMA->LAD ok, VG->PDA ok, VG->OM ok;  d. 03/2012 MV: EF 32% inf & inflat scar w/o ischemia; e. Lexi MV: EF 33%, inf infarct, no ischemia.  . Atrial flutter     a. 01/2011 s/p unsuccessful RFCA complicated by CHB req PPM.  . CHB (complete heart block)     a. 01/2011: in setting of RFCA, s/p SJM 2110 [employer] DC PPM, ser # [id number].  [name]  Kidney cysts     a. bilateral  . Hiatal hernia     a. 03/01/2013 EGD and esoph dil.  [name] BPH (benign prostatic hyperplasia)     a. elevated PSA  . A-fib     a. chronic coumadin  . Elevated PSA, less than 10 ng/ml   . Fibromyalgia   . Skin cancer     'above left eyebrow; tip of my nose" (08/29/2012)  . Depression     "since 1986-1987" (08/29/2012)  . Anxiety   . Panic attacks   . Chronic systolic CHF (congestive heart failure), NYHA class 2     a. 06/2012 Echo: EF 25%, distal sept and apical AK, sev dil LV, mild LVH, mod dil LA.    Past Surgical History  Procedure Laterality Date  . Coronary artery bypass graft  1991    CABG X 5  . Coronary artery bypass graft  06/2005    CABG X 3  . Cardiac catheterization  10/30/2009    Grafts patent. Mild to moderate LV dysfunction. EF 45%. Managed medically.   . Cardiac catheterization      multiple  . Tonsillectomy  1960  . Cholecystectomy  2007  . Insert / replace / remove pacemaker  01/2011    initial placement  . Inguinal hernia repair  1960's    "left I think"  . Cataract extraction w/ intraocular lens  implant, bilateral  2012  . Cardioversion  05/25/2012    Procedure: CARDIOVERSION;  Surgeon: Darlin Coco, MD;  Location: [facility] ENDOSCOPY;  Service: Cardiovascular;  Laterality: N/A;  . Humerus surgery Left 1960    "growth on the bone taken off; not cancer" (08/29/2012)  . Excisional hemorrhoidectomy  ~ 1980  . Skin cancer excision      'above left eyebrow; tip of my nose" (08/29/2012)  . Atrial flutter ablation  01/2011    "didn't work" (08/29/2012)     Current Outpatient Prescriptions  Medication Sig Dispense Refill  . acetaminophen (TYLENOL) 500 MG tablet Take 500 mg by mouth every 8 (eight) hours as needed.    . ALPRAZolam (XANAX) 1 MG tablet Take 1 tablet (1 mg total) by mouth 3 (three) times daily as needed. For anxiety 270 tablet 1  . atorvastatin (LIPITOR) 40 MG tablet TAKE 1 BY MOUTH ALTERNATING WITH 1/2   TABLET EVERY OTHER  DAY AS DIRECTED 135 tablet 1  . carvedilol (COREG) 6.25 MG tablet Take 1 tablet (6.25 mg total) by mouth 2 (two) times daily. 180 tablet 3  . doxepin (SINEQUAN) 25 MG capsule 1 capsule every morning and 2 capsules at night 270 capsule 3  . gabapentin (NEURONTIN) 100 MG capsule TAKE 1 BY MOUTH 4 TIMES DAILY 360 capsule 2  . hydroxypropyl methylcellulose (ISOPTO TEARS) 2.5 % ophthalmic solution Place 1 drop into both eyes 4 (four) times daily as needed. Dry eyes    . isosorbide mononitrate (IMDUR) 60 MG 24 hr tablet TAKE 1 BY MOUTH DAILY 90 tablet 1  . lisinopril (PRINIVIL,ZESTRIL) 10 MG tablet Take 10 mg  by mouth as directed. 1/2 tablet in the morning and 1 at bedtime    . loratadine (CLARITIN) 10 MG tablet Take 10 mg by mouth daily.    . Multiple Vitamin (MULTIVITAMIN) tablet Take 1 tablet by mouth daily.      . nitroGLYCERIN (NITROSTAT) 0.4 MG SL tablet Place 1 tablet (0.4 mg total) under the tongue every 5 (five) minutes as needed. For chest pain 100 tablet prn  . omeprazole (PRILOSEC) 20 MG capsule Take 1 capsule (20 mg total) by mouth 2 (two) times daily. 180 capsule 3  . polyethylene glycol (MIRALAX / GLYCOLAX) packet Take 13 g by mouth daily.    [name] warfarin (COUMADIN) 5 MG tablet Take 1 tablet (5 mg total) by mouth at bedtime. 90 tablet 2   No current facility-administered medications for this visit.    Allergies:   Sulfa drugs cross reactors; Ultram; Xarelto; and Zithromax    Social History:  The patient  reports that he quit smoking about 40 years ago. His smoking use included Cigarettes. He has a 5 pack-year smoking history. He has never used smokeless tobacco. He reports that he does not drink alcohol or use illicit drugs.   Family History:  The patient's family history includes Diabetes in his sister; Heart attack in his father; Heart disease in his father; Hypertension in his father; Stroke in his mother.    ROS:  Please see the history of present illness.   Otherwise, review of  systems are positive for none.   All other systems are reviewed and negative.    PHYSICAL EXAM: VS:  BP 130/78 mmHg  Pulse 68  Ht 5\' 11"  (1.803 m)  Wt 178 lb 12.8 oz (81.103 kg)  BMI 24.95 kg/m2 , BMI Body mass index is 24.95 kg/(m^2). GEN: Well nourished, well developed, in no acute distress HEENT: normal Neck: no JVD, carotid bruits, or masses Cardiac: RRR; no murmurs, rubs, or gallops,no edema  Respiratory:  clear to auscultation bilaterally, normal work of breathing GI: soft, nontender, nondistended, + BS MS: no deformity or atrophy Skin: warm and dry, no rash Neuro:  Strength and sensation are intact Psych: euthymic mood, full affect   EKG:  EKG is ordered today. The ekg ordered today demonstrates atrial fibrillation and left bundle-branch block, unchanged from 02/15/14   Recent Labs: 01/10/2014: ALT 20 02/15/2014: Platelets 187; Pro B Natriuretic peptide (BNP) 788.4* 04/01/2014: Hemoglobin 13.9* 04/16/2014: BUN 14; Creatinine 1.0; Potassium 5.0; Sodium 131*    Lipid Panel    Component Value Date/Time   CHOL 115 01/10/2014 1226   TRIG 65.0 01/10/2014 1226   HDL 40.80 01/10/2014 1226   CHOLHDL 3 01/10/2014 1226   VLDL 13.0 01/10/2014 1226   LDLCALC 61 01/10/2014 1226   LDLDIRECT 77.3 12/20/2010 0922      Wt Readings from Last 3 Encounters:  05/29/14 178 lb 12.8 oz (81.103 kg)  04/16/14 179 lb 12.8 oz (81.557 kg)  04/08/14 181 lb 6.4 oz (82.283 kg)      Other studies Reviewed:   ASSESSMENT AND PLAN:  1. Ischemic heart disease status post CABG remotely and status post redo CABG in 2007. 2. Hyperlipidemia 3. hypertensive heart disease without heart failure 4. Anxiety 5. permanent atrial fibrillation with functioning ventricular pacemaker. 6. Dyspnea. Most recent ejection fraction by echo on 12/13/13 is 30%, improved from 73% 7. systolic heart murmur at aortic area. Echo shows aortic valve sclerosis and mild mitral regurgitation 8. PAD   Current  medicines are reviewed at  length with the patient today.  The patient does not have concerns regarding medicines.  The following changes have been made:  no change  Labs/ tests ordered today include:   Orders Placed This Encounter  Procedures  . EKG 12-Lead     Disposition: The patient was reassured.  He may have occasional chest tightness from time to time.  He should not get too upset or surprise when this happens.  If the pain is severe he may take nitroglycerin.  Only if he remains quiet and calm the pain will dissipate on its own.  Continue current medication.  He monitors his blood pressure closely at home.  If his blood pressure is elevated he takes an extra half lisinopril in the mornings.  FU with Dr. Mare Ferrari in 6 weeks office visit and fasting lab work   Signed, Darlin Coco, MD  05/29/2014 5:27 PM    [address], [place], [place]  [address] Phone: [phone]; Fax: [phone]

## 2014-05-29 NOTE — Telephone Encounter (Signed)
New message      Patient c/o Palpitations:  High priority if patient c/o lightheadedness and shortness of breath.  1. How long have you been having palpitations? Over 1 week  2. Are you currently experiencing lightheadedness and shortness of breath? no  3. Have you checked your BP and heart rate? (document readings) this am 121/60  4. Are you experiencing any other symptoms?  Now no symptoms----last night after walking chest pain and heart skipping

## 2014-05-29 NOTE — Patient Instructions (Signed)
Your physician recommends that you continue on your current medications as directed. Please refer to the Current Medication list given to you today.  Keep your March appointment as scheduled

## 2014-05-31 ENCOUNTER — Other Ambulatory Visit: Payer: Self-pay | Admitting: *Deleted

## 2014-05-31 MED ORDER — WARFARIN SODIUM 5 MG PO TABS: 5.0000 mg | ORAL_TABLET | Freq: Every day | ORAL | Status: DC

## 2014-06-10 ENCOUNTER — Telehealth: Payer: Self-pay | Admitting: Cardiology

## 2014-06-10 NOTE — Telephone Encounter (Signed)
I would suggest decreasing the distance that he walks to just a quarter of a mile at a time and he could do that 3 times a day.

## 2014-06-10 NOTE — Telephone Encounter (Signed)
Advised patient

## 2014-06-10 NOTE — Telephone Encounter (Signed)
New message      Pt c/o of Chest Pain: STAT if CP now or developed within 24 hours  1. Are you having CP right now? yes  2. Are you experiencing any other symptoms (ex. SOB, nausea, vomiting, sweating)? tired  3. How long have you been experiencing CP? Chest hurts after he walks---pt told Dr Mare Ferrari this when he saw him in jan  4. Is your CP continuous or coming and going?   5. Have you taken Nitroglycerin? Yes----took 3 nitro within the last 15-20 minutes ?

## 2014-06-10 NOTE — Telephone Encounter (Signed)
Pt called because he walking about 3/4 of mile. After stopped walking he started having chest pain. Pt took NTG SL writhing 15 to 20  Minutes his BP prior taking the NTG was 134/84 later on it was 124/74, pt denies SOB he is just tired, after the 3 rd NTG his BP is 108/84. Pt states that his heart beats was skipping. Pt denies CP at this time after taking the  NTG's. Pt would like to know that  he still needs to continue walking.

## 2014-06-11 ENCOUNTER — Encounter (HOSPITAL_COMMUNITY): Payer: Self-pay | Admitting: *Deleted

## 2014-06-11 ENCOUNTER — Observation Stay (HOSPITAL_COMMUNITY)
Admission: EM | Admit: 2014-06-11 | Discharge: 2014-06-12 | Disposition: A | Discharge status: Home / Self Care | Payer: Medicare Other | Attending: Cardiology | Admitting: Cardiology

## 2014-06-11 ENCOUNTER — Emergency Department (HOSPITAL_COMMUNITY): Payer: Medicare Other

## 2014-06-11 DIAGNOSIS — Z87891 Personal history of nicotine dependence: Secondary | ICD-10-CM | POA: Insufficient documentation

## 2014-06-11 DIAGNOSIS — I4892 Unspecified atrial flutter: Secondary | ICD-10-CM | POA: Diagnosis not present

## 2014-06-11 DIAGNOSIS — I255 Ischemic cardiomyopathy: Secondary | ICD-10-CM | POA: Diagnosis not present

## 2014-06-11 DIAGNOSIS — I442 Atrioventricular block, complete: Secondary | ICD-10-CM | POA: Diagnosis not present

## 2014-06-11 DIAGNOSIS — I4891 Unspecified atrial fibrillation: Secondary | ICD-10-CM | POA: Diagnosis not present

## 2014-06-11 DIAGNOSIS — F419 Anxiety disorder, unspecified: Secondary | ICD-10-CM | POA: Diagnosis present

## 2014-06-11 DIAGNOSIS — Z79899 Other long term (current) drug therapy: Secondary | ICD-10-CM | POA: Insufficient documentation

## 2014-06-11 DIAGNOSIS — M797 Fibromyalgia: Secondary | ICD-10-CM | POA: Insufficient documentation

## 2014-06-11 DIAGNOSIS — Q6100 Congenital renal cyst, unspecified: Secondary | ICD-10-CM | POA: Diagnosis not present

## 2014-06-11 DIAGNOSIS — I10 Essential (primary) hypertension: Secondary | ICD-10-CM | POA: Diagnosis not present

## 2014-06-11 DIAGNOSIS — N400 Enlarged prostate without lower urinary tract symptoms: Secondary | ICD-10-CM | POA: Diagnosis not present

## 2014-06-11 DIAGNOSIS — F410 Panic disorder [episodic paroxysmal anxiety] without agoraphobia: Secondary | ICD-10-CM | POA: Insufficient documentation

## 2014-06-11 DIAGNOSIS — K5900 Constipation, unspecified: Secondary | ICD-10-CM | POA: Insufficient documentation

## 2014-06-11 DIAGNOSIS — R011 Cardiac murmur, unspecified: Secondary | ICD-10-CM | POA: Insufficient documentation

## 2014-06-11 DIAGNOSIS — Z951 Presence of aortocoronary bypass graft: Secondary | ICD-10-CM

## 2014-06-11 DIAGNOSIS — K219 Gastro-esophageal reflux disease without esophagitis: Secondary | ICD-10-CM | POA: Insufficient documentation

## 2014-06-11 DIAGNOSIS — Z85828 Personal history of other malignant neoplasm of skin: Secondary | ICD-10-CM | POA: Diagnosis not present

## 2014-06-11 DIAGNOSIS — Z95810 Presence of automatic (implantable) cardiac defibrillator: Secondary | ICD-10-CM | POA: Diagnosis present

## 2014-06-11 DIAGNOSIS — R110 Nausea: Secondary | ICD-10-CM | POA: Diagnosis not present

## 2014-06-11 DIAGNOSIS — I2510 Atherosclerotic heart disease of native coronary artery without angina pectoris: Secondary | ICD-10-CM | POA: Insufficient documentation

## 2014-06-11 DIAGNOSIS — I5022 Chronic systolic (congestive) heart failure: Secondary | ICD-10-CM

## 2014-06-11 DIAGNOSIS — R079 Chest pain, unspecified: Secondary | ICD-10-CM

## 2014-06-11 DIAGNOSIS — I481 Persistent atrial fibrillation: Secondary | ICD-10-CM

## 2014-06-11 DIAGNOSIS — K449 Diaphragmatic hernia without obstruction or gangrene: Secondary | ICD-10-CM | POA: Insufficient documentation

## 2014-06-11 DIAGNOSIS — R0789 Other chest pain: Secondary | ICD-10-CM | POA: Diagnosis not present

## 2014-06-11 DIAGNOSIS — E785 Hyperlipidemia, unspecified: Secondary | ICD-10-CM | POA: Diagnosis not present

## 2014-06-11 DIAGNOSIS — Z9889 Other specified postprocedural states: Secondary | ICD-10-CM | POA: Diagnosis not present

## 2014-06-11 DIAGNOSIS — F329 Major depressive disorder, single episode, unspecified: Secondary | ICD-10-CM | POA: Diagnosis not present

## 2014-06-11 LAB — BASIC METABOLIC PANEL
ANION GAP: 5 (ref 5–15)
BUN: 10 mg/dL (ref 6–23)
CO2: 29 mmol/L (ref 19–32)
CREATININE: 1.05 mg/dL (ref 0.50–1.35)
Calcium: 8.7 mg/dL (ref 8.4–10.5)
Chloride: 100 mmol/L (ref 96–112)
GFR calc Af Amer: 79 mL/min — ABNORMAL LOW (ref >90)
GFR calc non Af Amer: 68 mL/min — ABNORMAL LOW (ref >90)
Glucose, Bld: 82 mg/dL (ref 70–99)
Potassium: 5 mmol/L (ref 3.5–5.1)
SODIUM: 134 mmol/L — AB (ref 135–145)

## 2014-06-11 LAB — CBC
HCT: 40.5 % (ref 39.0–52.0)
HEMOGLOBIN: 13.4 g/dL (ref 13.0–17.0)
MCH: 31.6 pg (ref 26.0–34.0)
MCHC: 33.1 g/dL (ref 30.0–36.0)
MCV: 95.5 fL (ref 78.0–100.0)
Platelets: 155 10*3/uL (ref 150–400)
RBC: 4.24 MIL/uL (ref 4.22–5.81)
RDW: 13.1 % (ref 11.5–15.5)
WBC: 4.2 10*3/uL (ref 4.0–10.5)

## 2014-06-11 LAB — TROPONIN I

## 2014-06-11 MED ORDER — WARFARIN SODIUM 5 MG PO TABS
5.0000 mg | ORAL_TABLET | Freq: Every day | ORAL | Status: DC
  Administered 2014-06-11: 5 mg via ORAL
  Filled 2014-06-11: qty 1

## 2014-06-11 MED ORDER — REGADENOSON 0.4 MG/5ML IV SOLN
0.4000 mg | Freq: Once | INTRAVENOUS | Status: AC
  Administered 2014-06-12: 0.4 mg via INTRAVENOUS
  Filled 2014-06-11: qty 5

## 2014-06-11 MED ORDER — DOXEPIN HCL 25 MG PO CAPS
25.0000 mg | ORAL_CAPSULE | Freq: Every day | ORAL | Status: DC
  Administered 2014-06-11: 25 mg via ORAL
  Filled 2014-06-11 (×2): qty 1

## 2014-06-11 MED ORDER — ISOSORBIDE MONONITRATE ER 60 MG PO TB24
60.0000 mg | ORAL_TABLET | Freq: Every day | ORAL | Status: DC
  Administered 2014-06-12: 60 mg via ORAL
  Filled 2014-06-11: qty 1

## 2014-06-11 MED ORDER — ATORVASTATIN CALCIUM 40 MG PO TABS
40.0000 mg | ORAL_TABLET | Freq: Every day | ORAL | Status: DC
  Administered 2014-06-11: 40 mg via ORAL
  Filled 2014-06-11: qty 1

## 2014-06-11 MED ORDER — LISINOPRIL 10 MG PO TABS: 10.0000 mg | ORAL_TABLET | ORAL | Status: DC

## 2014-06-11 MED ORDER — LISINOPRIL 10 MG PO TABS
10.0000 mg | ORAL_TABLET | Freq: Every day | ORAL | Status: DC
  Administered 2014-06-11: 10 mg via ORAL
  Filled 2014-06-11: qty 1

## 2014-06-11 MED ORDER — GABAPENTIN 100 MG PO CAPS
100.0000 mg | ORAL_CAPSULE | Freq: Three times a day (TID) | ORAL | Status: DC
  Administered 2014-06-11 – 2014-06-12 (×4): 100 mg via ORAL
  Filled 2014-06-11 (×5): qty 1

## 2014-06-11 MED ORDER — NITROGLYCERIN 0.4 MG SL SUBL
0.4000 mg | SUBLINGUAL_TABLET | SUBLINGUAL | Status: DC | PRN
Start: 2014-06-11 — End: 2014-06-12
  Administered 2014-06-11 – 2014-06-12 (×4): 0.4 mg via SUBLINGUAL
  Filled 2014-06-11 (×5): qty 1

## 2014-06-11 MED ORDER — ALPRAZOLAM 0.5 MG PO TABS
1.0000 mg | ORAL_TABLET | Freq: Three times a day (TID) | ORAL | Status: DC
  Administered 2014-06-11 – 2014-06-12 (×4): 1 mg via ORAL
  Filled 2014-06-11 (×4): qty 2

## 2014-06-11 MED ORDER — ACETAMINOPHEN 325 MG PO TABS
650.0000 mg | ORAL_TABLET | Freq: Four times a day (QID) | ORAL | Status: DC | PRN
  Administered 2014-06-11: 650 mg via ORAL
  Filled 2014-06-11: qty 2

## 2014-06-11 MED ORDER — PANTOPRAZOLE SODIUM 40 MG PO TBEC
40.0000 mg | DELAYED_RELEASE_TABLET | Freq: Every day | ORAL | Status: DC
Start: 2014-06-11 — End: 2014-06-12
  Administered 2014-06-11 – 2014-06-12 (×2): 40 mg via ORAL
  Filled 2014-06-11 (×2): qty 1

## 2014-06-11 MED ORDER — WARFARIN - PHYSICIAN DOSING INPATIENT: Freq: Every day | Status: DC

## 2014-06-11 MED ORDER — MORPHINE SULFATE 2 MG/ML IJ SOLN
2.0000 mg | Freq: Once | INTRAMUSCULAR | Status: AC
  Administered 2014-06-11: 2 mg via INTRAVENOUS
  Filled 2014-06-11: qty 1

## 2014-06-11 NOTE — Telephone Encounter (Signed)
Spoke with patient and he has had chest pains several times since last night, relieved with NTG Patient currently having discomfort/soreness in chest  Discussed with  Dr. Mare Ferrari and will have patient call 911 to go to ED Advised patient, verbalized understanding

## 2014-06-11 NOTE — ED Notes (Signed)
Heart healthy tray ordered 

## 2014-06-11 NOTE — ED Notes (Signed)
Patient being Transported by [facility]vet[facility] Coder, [place]

## 2014-06-11 NOTE — ED Provider Notes (Signed)
CSN: [id number]     Arrival date & time 06/11/14  1124 History   First MD Initiated Contact with Patient 06/11/14 1131     Chief Complaint  Patient presents with  . Chest Pain     (Consider location/radiation/quality/duration/timing/severity/associated sxs/prior Treatment) HPI Comments: Pt is a 74 y.o. male with known CAD s/p repeat CABG. Reports yesterday around 4pm after walking developed chest pain that was relieved with nitro, lasted about 10 minutes. Pain came back around 9pm, again relieved, and woke him up at 3:30am, most recently again this morning at 9am at which time he called his cardiologist who told him to call EMS and come to the ED. Describes pain as aching, 8/10 currently.   Patient is a 74 y.o. male presenting with chest pain. The history is provided by the patient. No language interpreter was used.  Chest Pain Pain location:  Substernal area Pain quality: aching   Pain radiates to:  L arm Pain radiates to the back: no   Pain severity:  Severe Onset quality:  Sudden Duration:  1 day Timing:  Intermittent Progression:  Worsening Chronicity:  New Context: at rest   Context: not breathing, not eating and no movement   Relieved by:  Nothing Ineffective treatments:  Aspirin and nitroglycerin Associated symptoms: claudication (stable, seen by vascular ) and nausea   Associated symptoms: no abdominal pain, no back pain, no dizziness, no fever, no headache, no numbness, no orthopnea, no palpitations, no PND, no shortness of breath, no syncope and not vomiting   Risk factors: aortic disease and coronary artery disease     Past Medical History  Diagnosis Date  . Ischemic cardiomyopathy     a. 06/2012 EF 25% by echo.  . Hypertension   . Hyperlipidemia   . GERD (gastroesophageal reflux disease)   . CAD (coronary artery disease)     a. 1991 s/p CABGx4;  b. redo CABG in 2007 (SVG->OM, VG->PDA->PLV);  c. 01/2011 Cath: LM 80ost, LAD 100p, LCX 100ost, OM1 100, RCA 100,  LIMA->LAD ok, VG->PDA ok, VG->OM ok;  d. 03/2012 MV: EF 32% inf & inflat scar w/o ischemia; e. Lexi MV: EF 33%, inf infarct, no ischemia.  . Atrial flutter     a. 01/2011 s/p unsuccessful RFCA complicated by CHB req PPM.  . CHB (complete heart block)     a. 01/2011: in setting of RFCA, s/p SJM 2110 Accent DC PPM, ser # [id number].  [name] Kitchen Kidney cysts     a. bilateral  . Hiatal hernia     a. 03/01/2013 EGD and esoph dil.  [name] Kitchen BPH (benign prostatic hyperplasia)     a. elevated PSA  . A-fib     a. chronic coumadin  . Elevated PSA, less than 10 ng/ml   . Fibromyalgia   . Skin cancer     'above left eyebrow; tip of my nose" (08/29/2012)  . Depression     "since 1986-1987" (08/29/2012)  . Anxiety   . Panic attacks   . Chronic systolic CHF (congestive heart failure), NYHA class 2     a. 06/2012 Echo: EF 25%, distal sept and apical AK, sev dil LV, mild LVH, mod dil LA.   Past Surgical History  Procedure Laterality Date  . Coronary artery bypass graft  1991    CABG X 5  . Coronary artery bypass graft  06/2005    CABG X 3  . Cardiac catheterization  10/30/2009    Grafts patent. Mild to moderate LV dysfunction. EF  45%. Managed medically.   . Cardiac catheterization      multiple  . Tonsillectomy  1960  . Cholecystectomy  2007  . Insert / replace / remove pacemaker  01/2011    initial placement  . Inguinal hernia repair  1960's    "left I think"  . Cataract extraction w/ intraocular lens  implant, bilateral  2012  . Cardioversion  05/25/2012    Procedure: CARDIOVERSION;  Surgeon: Darlin Coco, MD;  Location: [facility] ENDOSCOPY;  Service: Cardiovascular;  Laterality: N/A;  . Humerus surgery Left 1960    "growth on the bone taken off; not cancer" (08/29/2012)  . Excisional hemorrhoidectomy  ~ 1980  . Skin cancer excision      'above left eyebrow; tip of my nose" (08/29/2012)  . Atrial flutter ablation  01/2011    "didn't work" (08/29/2012)   Family History  Problem Relation Age of Onset  . Stroke  Mother   . Heart attack Father     died @ 54, mult MI's.  [name] Kitchen Heart disease Father   . Hypertension Father   . Diabetes Sister    History  Substance Use Topics  . Smoking status: Former Smoker -- 0.50 packs/day for 10 years    Types: Cigarettes    Quit date: 09/21/1973  . Smokeless tobacco: Never Used  . Alcohol Use: No    Review of Systems  Constitutional: Negative for fever.  Respiratory: Negative for shortness of breath.   Cardiovascular: Positive for chest pain and claudication (stable, seen by vascular ). Negative for palpitations, orthopnea, leg swelling, syncope and PND.  Gastrointestinal: Positive for nausea and constipation (baseline). Negative for vomiting, abdominal pain, diarrhea and blood in stool.  Genitourinary: Negative for dysuria.  Musculoskeletal: Negative for back pain.  Skin: Negative for rash.  Neurological: Negative for dizziness, syncope, numbness and headaches.  All other systems reviewed and are negative.     Allergies  Sulfa drugs cross reactors; Ultram; Xarelto; and Zithromax  Home Medications   Prior to Admission medications   Medication Sig Start Date End Date Taking? Authorizing Provider  acetaminophen (TYLENOL) 500 MG tablet Take 500 mg by mouth every 8 (eight) hours as needed.   Yes Historical Provider, MD  ALPRAZolam Duanne Moron) 1 MG tablet Take 1 tablet (1 mg total) by mouth 3 (three) times daily as needed. For anxiety 01/16/14  Yes Darlin Coco, MD  atorvastatin (LIPITOR) 40 MG tablet TAKE 1 BY MOUTH ALTERNATING WITH 1/2   TABLET EVERY OTHER DAY AS DIRECTED 04/08/14  Yes Darlin Coco, MD  carvedilol (COREG) 6.25 MG tablet Take 1 tablet (6.25 mg total) by mouth 2 (two) times daily. 12/25/13  Yes Darlin Coco, MD  doxepin (SINEQUAN) 25 MG capsule 1 capsule every morning and 2 capsules at night 01/16/14  Yes Darlin Coco, MD  gabapentin (NEURONTIN) 100 MG capsule TAKE 1 BY MOUTH 4 TIMES DAILY 04/07/14  Yes Darlin Coco, MD   hydroxypropyl methylcellulose (ISOPTO TEARS) 2.5 % ophthalmic solution Place 1 drop into both eyes 4 (four) times daily as needed. Dry eyes   Yes Historical Provider, MD  isosorbide mononitrate (IMDUR) 60 MG 24 hr tablet TAKE 1 BY MOUTH DAILY 04/04/14  Yes Darlin Coco, MD  lisinopril (PRINIVIL,ZESTRIL) 10 MG tablet Take 10 mg by mouth as directed. 1/2 tablet in the morning and 1 at bedtime   Yes Historical Provider, MD  loratadine (CLARITIN) 10 MG tablet Take 10 mg by mouth daily.   Yes Historical Provider, MD  Multiple Vitamin (MULTIVITAMIN)  tablet Take 1 tablet by mouth daily.     Yes Historical Provider, MD  nitroGLYCERIN (NITROSTAT) 0.4 MG SL tablet Place 1 tablet (0.4 mg total) under the tongue every 5 (five) minutes as needed. For chest pain 07/05/13  Yes Darlin Coco, MD  omeprazole (PRILOSEC) 20 MG capsule Take 1 capsule (20 mg total) by mouth 2 (two) times daily. 03/25/13  Yes Darlin Coco, MD  polyethylene glycol (MIRALAX / GLYCOLAX) packet Take 17 g by mouth daily.    Yes Historical Provider, MD  warfarin (COUMADIN) 5 MG tablet Take 1 tablet (5 mg total) by mouth at bedtime. 05/31/14  Yes Chipper Herb, MD   BP 112/70 mmHg  Pulse 100  Temp(Src) 97.9 F (36.6 C) (Oral)  Resp 15  SpO2 100% Physical Exam  Constitutional: He is oriented to person, place, and time. He appears well-developed and well-nourished. No distress.  HENT:  Head: Normocephalic and atraumatic.  Eyes: EOM are normal. Pupils are equal, round, and reactive to light.  Cardiovascular: Normal rate, S1 normal and S2 normal.  An irregularly irregular rhythm present. Exam reveals no gallop and no friction rub.   Murmur heard. Split S2  Pulmonary/Chest: Effort normal and breath sounds normal. He has no wheezes. He has no rales. He exhibits tenderness (mild LUSB).  Abdominal: Soft. Bowel sounds are normal. He exhibits no distension. Mass: hernia. There is no tenderness. There is no rebound and no guarding.   Musculoskeletal: He exhibits no edema or tenderness.  Neurological: He is alert and oriented to person, place, and time.  Skin: Skin is warm and dry.  Psychiatric: He has a normal mood and affect. His behavior is normal. Judgment and thought content normal.  Nursing note and vitals reviewed.   ED Course  Procedures (including critical care time) Labs Review Labs Reviewed  BASIC METABOLIC PANEL - Abnormal; Notable for the following:    Sodium 134 (*)    GFR calc non Af Amer 68 (*)    GFR calc Af Amer 79 (*)    All other components within normal limits  TROPONIN I  CBC    Imaging Review Dg Chest 2 View  06/11/2014   CLINICAL DATA:  Chest pain on and off since yesterday  EXAM: CHEST  2 VIEW  COMPARISON:  02/15/2014  FINDINGS: Stable appearance of biventricular pacer leads from the left with a notably redundant right ventricular and atrial leads. Stable, normal heart size and aortic contours post CABG. There is no edema, consolidation, effusion, or pneumothorax. No osseous findings to explain chest pain.  IMPRESSION: Stable exam.No active cardiopulmonary disease.   Electronically Signed   By: Monte Fantasia M.D.   On: 06/11/2014 14:00     EKG Interpretation   Date/Time:  Wednesday June 11 2014 11:32:23 EST Ventricular Rate:  60 PR Interval:  69 QRS Duration: 191 QT Interval:  451 QTC Calculation: 451 R Axis:   -92 Text Interpretation:  Sinus rhythm Short PR interval Nonspecific IVCD with  LAD Borderline T abnormalities, lateral leads similar to previous overall  Confirmed by ZAVITZ  MD, JOSHUA ([id number]) on 06/11/2014 11:37:18 AM      MDM   Final diagnoses:  Atypical chest pain   Concern for unstable angina. Get troponin, cxr. Morphine. Consult to cards (most recent cath 2012).  Initial troponin negative. Cards will observe overnight, cycle troponins, and myoview stress tomorrow.    Leone Brand, MD 06/11/14 1524  Mariea Clonts, MD 06/11/14 [phone]

## 2014-06-11 NOTE — Telephone Encounter (Signed)
F/U      Pt c/o of Chest Pain: STAT if CP now or developed within 24 hours  1. Are you having CP right now?  Yes    2. Are you experiencing any other symptoms (ex. SOB, nausea, vomiting, sweating)? No other symptoms  3. How long have you been experiencing CP? Yesterday 06/10/14  4. Is your CP continuous or coming and going? Yes   5. Have you taken Nitroglycerin?  Yes, 3 at 3:30am    Pt woke up at 3:30 this morning with chest pain.  ?

## 2014-06-11 NOTE — Telephone Encounter (Signed)
Agree with advice given. His last cath was in 2012.

## 2014-06-11 NOTE — H&P (Signed)
Patient ID: [name] MRN: [id number], DOB/AGE: [birth year]   Admit date: 06/11/2014   Primary Physician: Redge Gainer, MD Primary Cardiologist: Dr Mare Ferrari  Pt. Profile:  Chest pain  Problem List  Past Medical History  Diagnosis Date  . Ischemic cardiomyopathy     a. 06/2012 EF 25% by echo.  . Hypertension   . Hyperlipidemia   . GERD (gastroesophageal reflux disease)   . CAD (coronary artery disease)     a. 1991 s/p CABGx4;  b. redo CABG in 2007 (SVG->OM, VG->PDA->PLV);  c. 01/2011 Cath: LM 80ost, LAD 100p, LCX 100ost, OM1 100, RCA 100, LIMA->LAD ok, VG->PDA ok, VG->OM ok;  d. 03/2012 MV: EF 32% inf & inflat scar w/o ischemia; e. Lexi MV: EF 33%, inf infarct, no ischemia.  . Atrial flutter     a. 01/2011 s/p unsuccessful RFCA complicated by CHB req PPM.  . CHB (complete heart block)     a. 01/2011: in setting of RFCA, s/p SJM 2110 [employer] DC PPM, ser # [id number].  [name] Kitchen Kidney cysts     a. bilateral  . Hiatal hernia     a. 03/01/2013 EGD and esoph dil.  [name] Kitchen BPH (benign prostatic hyperplasia)     a. elevated PSA  . A-fib     a. chronic coumadin  . Elevated PSA, less than 10 ng/ml   . Fibromyalgia   . Skin cancer     'above left eyebrow; tip of my nose" (08/29/2012)  . Depression     "since 1986-1987" (08/29/2012)  . Anxiety   . Panic attacks   . Chronic systolic CHF (congestive heart failure), NYHA class 2     a. 06/2012 Echo: EF 25%, distal sept and apical AK, sev dil LV, mild LVH, mod dil LA.    Past Surgical History  Procedure Laterality Date  . Coronary artery bypass graft  1991    CABG X 5  . Coronary artery bypass graft  06/2005    CABG X 3  . Cardiac catheterization  10/30/2009    Grafts patent. Mild to moderate LV dysfunction. EF 45%. Managed medically.   . Cardiac catheterization      multiple  . Tonsillectomy  1960  . Cholecystectomy  2007  . Insert / replace / remove pacemaker  01/2011    initial placement  . Inguinal hernia repair  1960's    "left I  think"  . Cataract extraction w/ intraocular lens  implant, bilateral  2012  . Cardioversion  05/25/2012    Procedure: CARDIOVERSION;  Surgeon: Darlin Coco, MD;  Location: [facility] ENDOSCOPY;  Service: Cardiovascular;  Laterality: N/A;  . Humerus surgery Left 1960    "growth on the bone taken off; not cancer" (08/29/2012)  . Excisional hemorrhoidectomy  ~ 1980  . Skin cancer excision      'above left eyebrow; tip of my nose" (08/29/2012)  . Atrial flutter ablation  01/2011    "didn't work" (08/29/2012)     Allergies  Allergies  Allergen Reactions  . Sulfa Drugs Cross Reactors Other (See Comments)    unknown  . Ultram [Tramadol Hcl] Itching    "don't remember how bad"  . Xarelto [Rivaroxaban] Other (See Comments)    Itching   . Zithromax [Azithromycin]     Itching     HPI  Patient is a 74 y.o. male followed by Dr Mare Ferrari, last seen in the clinic on 05/29/2014. He has a history of significant ischemic heart disease.[name] Kitchen He was admitted to [facility] on 03/09/12 and discharged on 03/11/12. He was admitted after he complained of having some chest discomfort at the time of a routine pacemaker check up. He was found to be in atrial fibrillation at the time of admission. On the date of discharge 11//13 the patient underwent a Lexus scan Myoview stress test which revealed left ventricular ejection fraction 32% with septal and inferior wall motion abnormalities and with an inferior lateral scar but no evidence of reversible ischemia. Most recent Myoview 03/27/13 showed a fixed inferoapical defect and no reversible ischemia and the ejection fraction was 33%. His prior ejection fraction at the time of catheter in 2012 was approximately 40%. 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 His last cardiac catheterization in October 2012 showed that his grafts were patent. The patient also has a history of hypercholesterolemia and essential hypertension. He has had a prior  history of paroxysmal atrial flutter with ablation therapy and subsequent heart block which required a [employer] pacemaker which was implanted in 2012. The patient is on long-term Coumadin. He has a lot of anxiety. On 05/25/12 the patient underwent elective cardioversion which was successful. Normal sinus rhythm and was returned as was confirmed by intracardiac electrogram via his pacemaker. However, within 24 hours after the cardioversion he felt that his pulse was irregular again. EKG confirmed that he was back in atrial flutter fibrillation. He was essentially asymptomatic and states that the arrhythmia doesn't really bother him much. Strategy is rate control and long-term anticoagulation. He is intolerant of Xarelto because of itching and is back on long-term Coumadin. The patient was readmitted to the hospital in November 2014 for chest pain. He underwent another nuclear stress test on 03/27/13. This showed an ejection fraction of 33% and showed an inferior wall scar but no reversible ischemia. His most recent echocardiogram on 12/13/13 showed an ejection fraction of 30% Since last visit he has had occasional chest discomfort.  Since we last saw him he has seen Dr. Gae Gallop regarding questionable leg claudication. Dr. Doren Custard did not feel that he required any further intervention. He was encouraged to continue to walk. Previous abdominal CT scan in April 2015 showed extensive atherosclerotic involvement of the aorta and iliac branches.  When he saw Dr Mare Ferrari on 05/29/14 he was complaining of chest pain after walking. He did not have any discomfort while he was actually walking. [facility] [place] provided reassurance. Today he called our office for another episode that woke him up this am, NTG gave him mild relief. His pain then recurred about 3 hours later, was radiating to his left arm and his neck. Again he states that he doesn't have pain while walking but rather afterwards or at rest.    Home  Medications  Prior to Admission medications   Medication Sig Start Date End Date Taking? Authorizing Provider  acetaminophen (TYLENOL) 500 MG tablet Take 500 mg by mouth every 8 (eight) hours as needed.    Historical Provider, MD  ALPRAZolam Duanne Moron) 1 MG tablet Take 1 tablet (1 mg total) by mouth 3 (three) times daily as needed. For anxiety 01/16/14   Darlin Coco, MD  atorvastatin (LIPITOR) 40 MG tablet TAKE 1 BY MOUTH ALTERNATING WITH 1/2   TABLET EVERY OTHER DAY AS DIRECTED 04/08/14   Darlin Coco, MD  carvedilol (COREG) 6.25 MG tablet Take 1 tablet (6.25 mg total) by mouth 2 (two) times daily. 12/25/13   Darlin Coco, MD  doxepin [facility])  25 MG capsule 1 capsule every morning and 2 capsules at night 01/16/14   Darlin Coco, MD  gabapentin (NEURONTIN) 100 MG capsule TAKE 1 BY MOUTH 4 TIMES DAILY 04/07/14   Darlin Coco, MD  hydroxypropyl methylcellulose (ISOPTO TEARS) 2.5 % ophthalmic solution Place 1 drop into both eyes 4 (four) times daily as needed. Dry eyes    Historical Provider, MD  isosorbide mononitrate (IMDUR) 60 MG 24 hr tablet TAKE 1 BY MOUTH DAILY 04/04/14   Darlin Coco, MD  lisinopril (PRINIVIL,ZESTRIL) 10 MG tablet Take 10 mg by mouth as directed. 1/2 tablet in the morning and 1 at bedtime    Historical Provider, MD  loratadine (CLARITIN) 10 MG tablet Take 10 mg by mouth daily.    Historical Provider, MD  Multiple Vitamin (MULTIVITAMIN) tablet Take 1 tablet by mouth daily.      Historical Provider, MD  nitroGLYCERIN (NITROSTAT) 0.4 MG SL tablet Place 1 tablet (0.4 mg total) under the tongue every 5 (five) minutes as needed. For chest pain 07/05/13   Darlin Coco, MD  omeprazole (PRILOSEC) 20 MG capsule Take 1 capsule (20 mg total) by mouth 2 (two) times daily. 03/25/13   Darlin Coco, MD  polyethylene glycol [facility] / GLYCOLAX) packet Take 13 g by mouth daily.    Historical Provider, MD  warfarin (COUMADIN) 5 MG tablet Take 1 tablet (5 mg total) by mouth  at bedtime. 05/31/14   Chipper Herb, MD    Family History  Family History  Problem Relation Age of Onset  . Stroke Mother   . Heart attack Father     died @ 27, mult MI's.  [name] Kitchen Heart disease Father   . Hypertension Father   . Diabetes Sister     Social History  History   Social History  . Marital Status: Widowed    Spouse Name: N/A  . Number of Children: 2  . Years of Education: 12   Occupational History  . Not on file.   Social History Main Topics  . Smoking status: Former Smoker -- 0.50 packs/day for 10 years    Types: Cigarettes    Quit date: 09/21/1973  . Smokeless tobacco: Never Used  . Alcohol Use: No  . Drug Use: No  . Sexual Activity: No   Other Topics Concern  . Not on file   Social History Narrative   Wife passed away in 2011/09/14. Lives in [place] with his daughter and her family.  Retired from [employer].     Review of Systems General:  No chills, fever, night sweats or weight changes.  Cardiovascular:  No chest pain, dyspnea on exertion, edema, orthopnea, palpitations, paroxysmal nocturnal dyspnea. Dermatological: No rash, lesions/masses Respiratory: No cough, dyspnea Urologic: No hematuria, dysuria Abdominal:   No nausea, vomiting, diarrhea, bright red blood per rectum, melena, or hematemesis Neurologic:  No visual changes, wkns, changes in mental status. All other systems reviewed and are otherwise negative except as noted above.  Physical Exam  Blood pressure 125/71, pulse 60, temperature 97.8 F (36.6 C), temperature source Oral, resp. rate 16, SpO2 100 %.  General: Pleasant, NAD Psych: Normal affect. Neuro: Alert and oriented X 3. Moves all extremities spontaneously. HEENT: Normal  Neck: Supple without bruits or JVD. Lungs:  Resp regular and unlabored, CTA. Heart: RRR no s3, s4, or murmurs. Abdomen: Soft, non-tender, non-distended, BS + x 4.  Extremities: No clubbing, cyanosis or edema. DP/PT/Radials 2+ and equal  bilaterally.  Labs  No results for input(s): CKTOTAL,  CKMB, TROPONINI in the last 72 hours. Lab Results  Component Value Date   WBC 7.3 04/01/2014   HGB 13.9* 04/01/2014   HCT 41.9* 04/01/2014   MCV 92.9 04/01/2014   PLT 187 02/15/2014   No results for input(s): NA, K, CL, CO2, BUN, CREATININE, CALCIUM, PROT, BILITOT, ALKPHOS, ALT, AST, GLUCOSE in the last 168 hours.  Invalid input(s): LABALBU Lab Results  Component Value Date   CHOL 115 01/10/2014   HDL 40.80 01/10/2014   LDLCALC 61 01/10/2014   TRIG 65.0 01/10/2014   No results found for: DDIMER Invalid input(s): POCBNP   Radiology/Studies  No results found.  Echocardiogram - 12/13/2013 Study Conclusions  - Left ventricle: LVEF is severely depressed at approximately 30% with diffuse hypokineisis; inferior. and basal posterior akinesis. The cavity size was mildly dilated. Wall thickness was normal. - Mitral valve: There was mild regurgitation. - Left atrium: The atrium was moderately dilated. - Right ventricle: The cavity size was mildly dilated. - Right atrium: The atrium was moderately dilated.  ECG: a-sensed, V paced rhythm    ASSESSMENT AND PLAN  1.Atypical chest pain - Ischemic heart disease status post CABG remotely and status post redo CABG in 2007, the last cath in 2012 with patent grafts.  ECG is unchanged, however difficult to interpret as he has paced rhythm. The first troponin is negative.  We will continue cycling troponin and schedule a Lexiscan nuclear stress test for tomorrow.  2. Hyperlipidemia - on moderate dose of atorvastatin 40 mg po daily, lipids at goal  3. Chronic systolic CHF - the patient appears euvolemic  4. Hypertensive heart disease without heart failure  5. permanent atrial fibrillation with functioning ventricular pacemaker.  6. Dyspnea. Most recent ejection fraction by echo on 12/13/13 is 30%, improved from 25%   Signed, Dorothy Spark, MD, [facility] 06/11/2014,  12:21 PM

## 2014-06-11 NOTE — ED Notes (Signed)
Paged Julian card to [phone]

## 2014-06-11 NOTE — ED Notes (Signed)
Pt left for XRay 

## 2014-06-11 NOTE — ED Notes (Signed)
Per [facility] pt started having chest pain yesterday at 4:00PM. He took a nitro and it was relieved. He had another episode at 9:00pm and it was relieved with nitro as well.  At 3:00am he had more chest pain and has been unable to relieve it with nitro or baby aspirin.  He took 4 81mg  of aspirin and 2 nitro this am without relief.  Pt called his MD who told him to come to [facility] to rule out another blockage.

## 2014-06-11 NOTE — ED Notes (Signed)
Called Pharmacy about medications.

## 2014-06-11 NOTE — ED Notes (Signed)
Patient being transported to Xray at this time. 

## 2014-06-12 ENCOUNTER — Observation Stay (HOSPITAL_COMMUNITY): Payer: Medicare Other

## 2014-06-12 ENCOUNTER — Encounter (HOSPITAL_COMMUNITY): Payer: Self-pay | Admitting: Physician Assistant

## 2014-06-12 DIAGNOSIS — R110 Nausea: Secondary | ICD-10-CM | POA: Diagnosis not present

## 2014-06-12 DIAGNOSIS — R0789 Other chest pain: Secondary | ICD-10-CM | POA: Diagnosis not present

## 2014-06-12 DIAGNOSIS — Z950 Presence of cardiac pacemaker: Secondary | ICD-10-CM

## 2014-06-12 DIAGNOSIS — R011 Cardiac murmur, unspecified: Secondary | ICD-10-CM | POA: Diagnosis not present

## 2014-06-12 DIAGNOSIS — K5900 Constipation, unspecified: Secondary | ICD-10-CM | POA: Diagnosis not present

## 2014-06-12 DIAGNOSIS — R079 Chest pain, unspecified: Secondary | ICD-10-CM

## 2014-06-12 LAB — BASIC METABOLIC PANEL
Anion gap: 4 — ABNORMAL LOW (ref 5–15)
BUN: 15 mg/dL (ref 6–23)
CALCIUM: 8.5 mg/dL (ref 8.4–10.5)
CO2: 31 mmol/L (ref 19–32)
CREATININE: 1.03 mg/dL (ref 0.50–1.35)
Chloride: 98 mmol/L (ref 96–112)
GFR calc Af Amer: 81 mL/min — ABNORMAL LOW (ref >90)
GFR calc non Af Amer: 70 mL/min — ABNORMAL LOW (ref >90)
Glucose, Bld: 108 mg/dL — ABNORMAL HIGH (ref 70–99)
Potassium: 5.2 mmol/L — ABNORMAL HIGH (ref 3.5–5.1)
Sodium: 133 mmol/L — ABNORMAL LOW (ref 135–145)

## 2014-06-12 LAB — PROTIME-INR
INR: 2.07 — ABNORMAL HIGH (ref 0.00–1.49)
Prothrombin Time: 23.5 seconds — ABNORMAL HIGH (ref 11.6–15.2)

## 2014-06-12 LAB — TROPONIN I: Troponin I: <0.03 ng/mL (ref <0.031)

## 2014-06-12 MED ORDER — ALUM & MAG HYDROXIDE-SIMETH 200-200-20 MG/5ML PO SUSP
30.0000 mL | Freq: Once | ORAL | Status: AC
  Administered 2014-06-12: 30 mL via ORAL
  Filled 2014-06-12: qty 30

## 2014-06-12 MED ORDER — MORPHINE SULFATE 2 MG/ML IJ SOLN
2.0000 mg | Freq: Once | INTRAMUSCULAR | Status: AC
  Administered 2014-06-12: 2 mg via INTRAVENOUS
  Filled 2014-06-12: qty 1

## 2014-06-12 MED ORDER — REGADENOSON 0.4 MG/5ML IV SOLN
INTRAVENOUS | Status: AC
  Administered 2014-06-12: 0.4 mg via INTRAVENOUS
  Filled 2014-06-12: qty 5

## 2014-06-12 MED ORDER — OFF THE BEAT BOOK
Freq: Once | Status: AC
  Administered 2014-06-12: 16:00:00
  Filled 2014-06-12: qty 1

## 2014-06-12 MED ORDER — TECHNETIUM TC 99M SESTAMIBI GENERIC - CARDIOLITE
10.0000 | Freq: Once | INTRAVENOUS | Status: AC | PRN
  Administered 2014-06-12: 10 via INTRAVENOUS

## 2014-06-12 MED ORDER — REGADENOSON 0.4 MG/5ML IV SOLN
0.4000 mg | Freq: Once | INTRAVENOUS | Status: DC
  Filled 2014-06-12: qty 5

## 2014-06-12 MED ORDER — TECHNETIUM TC 99M SESTAMIBI GENERIC - CARDIOLITE
30.0000 | Freq: Once | INTRAVENOUS | Status: AC | PRN
  Administered 2014-06-12: 30 via INTRAVENOUS

## 2014-06-12 MED ORDER — POLYVINYL ALCOHOL 1.4 % OP SOLN
1.0000 [drp] | OPHTHALMIC | Status: DC | PRN
  Administered 2014-06-12: 1 [drp] via OPHTHALMIC
  Filled 2014-06-12: qty 15

## 2014-06-12 NOTE — Discharge Summary (Signed)
Discharge Summary   Patient ID: [name],  MRN: [id number], DOB/AGE: 07/16/1940 74 y.o.  Admit date: 06/11/2014 Discharge date: 06/12/2014  Primary Care Provider: [facility] Primary Cardiologist: Dr. Mare Ferrari  Discharge Diagnoses Principal Problem:   Chest pain at rest Active Problems:   Ischemic cardiomyopathy   Hypertension   Hyperlipidemia   Anxiety   Hx of CABG   Atrial fibrillation-Permanent   Pacemaker-stJudes   Chronic systolic CHF (congestive heart failure), NYHA class 2   Chest pain   Allergies Allergies  Allergen Reactions  . Sulfa Drugs Cross Reactors Other (See Comments)    unknown  . Ultram [Tramadol Hcl] Itching    "don't remember how bad"  . Xarelto [Rivaroxaban] Other (See Comments)    Itching   . Zithromax [Azithromycin]     Itching     Procedures  Lexiscan stress test  MPRESSION: 1. High risk pharmacologic nuclear stress study with a large size, fixed defect in the entire inferior, basal and mid inferolateral, apical lateral walls and in the true apex consistent with a scar in the RCA territory with no ischemia.  2. There is severe left ventricular systolic impairment with LVEF 26% and akinesis in the inferior and inferolateral walls.  3. Uptake in the right ventricle is suggestive of acute CHF and elevated filling pressures.    Hospital Course  The patient is a 74 year old male followed by Dr. Mare Ferrari who was last seen in the clinic on 05/27/2014. 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 His last stress test in November 2014 showed fixed inferoapical defect with no reversible ischemia with EF 33%. He has a history of previous CABG and redo CABG in 2007. His last cardiac catheterization in October 2012 showed patent grafts.   He contacted cardiology office on 06/11/2014 after waking  up with chest pain. Nitroglycerin gave him moderate relief. The discomfort recurred roughly 3 hours later radiating to his left arm and his neck. However he does not have any pain with ambulation. He was referred to [facility] for further evaluation. He was admitted to cardiology service overnight. Serial troponin was negative. He underwent Lexis scan stress test in the morning of 06/12/2014 which showed EF 26%, akinesis in the inferior and inferolateral wall, large-sized please fixed defect in the entire inferior, basal and mid inferolateral, apical lateral wall consistent with a scar in the RCA territory with no ischemia. After comparing the stress test with previous stress test, there is essentially no changes.  Patient was seen after stress test, at which time he was ambulating without significant chest discomfort, he is deemed stable for discharge from cardiology perspective. He has a previously scheduled follow-up with Dr. Mare Ferrari in one month.  Discharge Vitals Blood pressure 124/56, pulse 69, temperature 97.9 F (36.6 C), temperature source Oral, resp. rate 19, height 5\' 11"  (1.803 m), weight 178 lb 2.1 oz (80.8 kg), SpO2 100 %.  Filed Weights   06/11/14 2000  Weight: 178 lb 2.1 oz (80.8 kg)    Labs  CBC  Recent Labs  06/11/14 1209  WBC 4.2  HGB 13.4  HCT 40.5  MCV 95.5  PLT 102   Basic Metabolic Panel  Recent Labs  06/11/14 1209 06/12/14 0441  NA 134* 133*  K 5.0 5.2*  CL 100 98  CO2 29 31  GLUCOSE 82 108*  BUN 10 15  CREATININE 1.05 1.03  CALCIUM 8.7 8.5   Cardiac Enzymes  Recent Labs  06/11/14 1209 06/12/14 1230  TROPONINI <0.03 <0.03    Disposition  Pt is being discharged home today in good condition.  Follow-up Plans & Appointments      Follow-up Information    Follow up with Darlin Coco, MD On 07/15/2014.   Specialty:  Cardiology   Why:  10:45am. Cardiology followup   Contact information:   [address] [place] [address] [phone]       Discharge Medications    Medication List    TAKE these medications        acetaminophen 500 MG tablet  Commonly known as:  TYLENOL  Take 500 mg by mouth every 8 (eight) hours as needed.     ALPRAZolam 1 MG tablet  Commonly known as:  XANAX  Take 1 tablet (1 mg total) by mouth 3 (three) times daily as needed. For anxiety     atorvastatin 40 MG tablet  Commonly known as:  LIPITOR  TAKE 1 BY MOUTH ALTERNATING WITH 1/2   TABLET EVERY OTHER DAY AS DIRECTED     carvedilol 6.25 MG tablet  Commonly known as:  COREG  Take 1 tablet (6.25 mg total) by mouth 2 (two) times daily.     doxepin 25 MG capsule  Commonly known as:  SINEQUAN  1 capsule every morning and 2 capsules at night     gabapentin 100 MG capsule  Commonly known as:  NEURONTIN  TAKE 1 BY MOUTH 4 TIMES DAILY     hydroxypropyl methylcellulose / hypromellose 2.5 % ophthalmic solution  Commonly known as:  ISOPTO TEARS / GONIOVISC  Place 1 drop into both eyes 4 (four) times daily as needed. Dry eyes     isosorbide mononitrate 60 MG 24 hr tablet  Commonly known as:  IMDUR  TAKE 1 BY MOUTH DAILY     lisinopril 10 MG tablet  Commonly known as:  PRINIVIL,ZESTRIL  Take 10 mg by mouth as directed. 1/2 tablet in the morning and 1 at bedtime     loratadine 10 MG tablet  Commonly known as:  CLARITIN  Take 10 mg by mouth daily.     multivitamin tablet  Take 1 tablet by mouth daily.     nitroGLYCERIN 0.4 MG SL tablet  Commonly known as:  NITROSTAT  Place 1 tablet (0.4 mg total) under the tongue every 5 (five) minutes as needed. For chest pain     omeprazole 20 MG capsule  Commonly known as:  PRILOSEC  Take 1 capsule (20 mg total) by mouth 2 (two) times daily.     polyethylene glycol packet  Commonly known as:  MIRALAX / GLYCOLAX  Take 17 g by mouth daily.     warfarin 5 MG tablet  Commonly known as:  COUMADIN  Take 1 tablet (5 mg total) by mouth at bedtime.          Duration of Discharge Encounter   Greater than 30 minutes including physician time.  Hilbert Corrigan PA-C Pager: [phone] 06/12/2014, 4:17 PM

## 2014-06-12 NOTE — Progress Notes (Signed)
UR completed 

## 2014-06-12 NOTE — Progress Notes (Signed)
Subjective: Mild chronic chest pain  Objective: Vital signs in last 24 hours: Temp:  [97.6 F (36.4 C)-97.9 F (36.6 C)] 97.6 F (36.4 C) (02/11 0419) Pulse Rate:  [57-100] 63 (02/11 0419) Resp:  [11-19] 18 (02/11 0419) BP: (103-153)/(54-96) 142/87 mmHg (02/11 0934) SpO2:  [97 %-100 %] 99 % (02/11 0419) Weight:  [178 lb 2.1 oz (80.8 kg)] 178 lb 2.1 oz (80.8 kg) (02/10 2000) Weight change:  Last BM Date: 06/11/14 Intake/Output from previous day: 02/10 0701 - 02/11 0700 In: 720 [P.O.:720] Out: 550 [Urine:550] Intake/Output this shift:    PE: General:Pleasant affect, NAD, very talkative with anxiety  Skin:Warm and dry, brisk capillary refill HEENT:normocephalic, sclera clear, mucus membranes moist Heart:S1S2 RRR without murmur, gallup, rub or click Lungs:clear without rales, rhonchi, or wheezes CHE:NIDP, non tender, + BS, do not palpate liver spleen or masses Ext:no lower ext edema, 2+ pedal pulses, 2+ radial pulses Neuro:alert and oriented X 3, MAE, follows commands, + facial symmetry   Lab Results:  Recent Labs  06/11/14 1209  WBC 4.2  HGB 13.4  HCT 40.5  PLT 155   BMET  Recent Labs  06/11/14 1209 06/12/14 0441  NA 134* 133*  K 5.0 5.2*  CL 100 98  CO2 29 31  GLUCOSE 82 108*  BUN 10 15  CREATININE 1.05 1.03  CALCIUM 8.7 8.5    Recent Labs  06/11/14 1209  TROPONINI <0.03    Lab Results  Component Value Date   CHOL 115 01/10/2014   HDL 40.80 01/10/2014   LDLCALC 61 01/10/2014   LDLDIRECT 77.3 12/20/2010   TRIG 65.0 01/10/2014   CHOLHDL 3 01/10/2014   Lab Results  Component Value Date   HGBA1C 5.7* 10/20/2011     Lab Results  Component Value Date   TSH 3.863 12/09/2011    Hepatic Function Panel No results for input(s): PROT, ALBUMIN, AST, ALT, ALKPHOS, BILITOT, BILIDIR, IBILI in the last 72 hours. No results for input(s): CHOL in the last 72 hours. No results for input(s): PROTIME in the last 72  hours.     Studies/Results: Dg Chest 2 View  06/11/2014   CLINICAL DATA:  Chest pain on and off since yesterday  EXAM: CHEST  2 VIEW  COMPARISON:  02/15/2014  FINDINGS: Stable appearance of biventricular pacer leads from the left with a notably redundant right ventricular and atrial leads. Stable, normal heart size and aortic contours post CABG. There is no edema, consolidation, effusion, or pneumothorax. No osseous findings to explain chest pain.  IMPRESSION: Stable exam.No active cardiopulmonary disease.   Electronically Signed   By: Monte Fantasia M.D.   On: 06/11/2014 14:00    Medications: I have reviewed the patient's current medications. Scheduled Meds: . ALPRAZolam  1 mg Oral TID  . atorvastatin  40 mg Oral q1800  . doxepin  25 mg Oral QHS  . gabapentin  100 mg Oral TID  . isosorbide mononitrate  60 mg Oral Daily  . lisinopril  10 mg Oral QHS  . pantoprazole  40 mg Oral Daily  . regadenoson      . regadenoson  0.4 mg Intravenous Once  . regadenoson  0.4 mg Intravenous Once  . warfarin  5 mg Oral q1800  . Warfarin - Physician Dosing Inpatient   Does not apply q1800   Continuous Infusions:  PRN Meds:.acetaminophen, nitroGLYCERIN  Assessment/Plan:    1.Atypical chest pain - Ischemic heart disease status post CABG remotely and  status post redo CABG in 2007, the last cath in 2012 with patent grafts.  ECG is unchanged, however difficult to interpret as he has paced rhythm. The first troponin is negative.  We will continue cycling troponin- no other troponins drawn, will order now-scheduled a Lexiscan nuclear stress test for today.  2. Hyperlipidemia - on moderate dose of atorvastatin 40 mg po daily, lipids at goal  3. Chronic systolic CHF - the patient appears euvolemic  4. Hypertensive heart disease without heart failure  5. permanent atrial fibrillation with functioning ventricular pacemaker.  6. Dyspnea. Most recent ejection fraction by echo on 12/13/13 is 30%,  improved from 25%    Time spent with pt. :15 minutes. [facility]  Nurse Practitioner Certified Pager [phone] or after 5pm and on weekends call [phone] 06/12/2014, 9:54 AM   I have seen and examined the patient along with INGOLD,LAURA R .  I have reviewed the chart, notes and new data.  I agree with NP's note.  Reviewed nuclear perfusion images. There is a large and severe inferior and inferolateral scar and severely depressed LVEF, but no reversible ischemia.  PLAN: No plan for invasive evaluation. DC if no recurrence of symptoms with ambulation.  Sanda Klein, MD, [place] [phone] 06/12/2014, 2:00 PM

## 2014-06-12 NOTE — Discharge Instructions (Signed)
Information on my medicine - Coumadin   (Warfarin)  This medication education was reviewed with me or my healthcare representative as part of my discharge preparation.  The pharmacist that spoke with me during my hospital stay was:  Wayland Salinas, [facility]  Why was Coumadin prescribed for you? Coumadin was prescribed for you because you have a blood clot or a medical condition that can cause an increased risk of forming blood clots. Blood clots can cause serious health problems by blocking the flow of blood to the heart, lung, or brain. Coumadin can prevent harmful blood clots from forming. As a reminder your indication for Coumadin is:   Stroke Prevention Because Of Atrial Fibrillation  What test will check on my response to Coumadin? While on Coumadin (warfarin) you will need to have an INR test regularly to ensure that your dose is keeping you in the desired range. The INR (international normalized ratio) number is calculated from the result of the laboratory test called prothrombin time (PT).  If an INR APPOINTMENT HAS NOT ALREADY BEEN MADE FOR YOU please schedule an appointment to have this lab work done by your health care provider within 7 days. Your INR goal is usually a number between:  2 to 3 or your provider may give you a more narrow range like 2-2.5.  Ask your health care provider during an office visit what your goal INR is.  What  do you need to  know  About  COUMADIN? Take Coumadin (warfarin) exactly as prescribed by your healthcare provider about the same time each day.  DO NOT stop taking without talking to the doctor who prescribed the medication.  Stopping without other blood clot prevention medication to take the place of Coumadin may increase your risk of developing a new clot or stroke.  Get refills before you run out.  What do you do if you miss a dose? If you miss a dose, take it as soon as you remember on the same day then continue your regularly scheduled  regimen the next day.  Do not take two doses of Coumadin at the same time.  Important Safety Information A possible side effect of Coumadin (Warfarin) is an increased risk of bleeding. You should call your healthcare provider right away if you experience any of the following: ? Bleeding from an injury or your nose that does not stop. ? Unusual colored urine (red or dark brown) or unusual colored stools (red or black). ? Unusual bruising for unknown reasons. ? A serious fall or if you hit your head (even if there is no bleeding).  Some foods or medicines interact with Coumadin (warfarin) and might alter your response to warfarin. To help avoid this: ? Eat a balanced diet, maintaining a consistent amount of Vitamin K. ? Notify your provider about major diet changes you plan to make. ? Avoid alcohol or limit your intake to 1 drink for women and 2 drinks for men per day. (1 drink is 5 oz. wine, 12 oz. beer, or 1.5 oz. liquor.)  Make sure that ANY health care provider who prescribes medication for you knows that you are taking Coumadin (warfarin).  Also make sure the healthcare provider who is monitoring your Coumadin knows when you have started a new medication including herbals and non-prescription products.  Coumadin (Warfarin)  Major Drug Interactions  Increased Warfarin Effect Decreased Warfarin Effect  Alcohol (large quantities) Antibiotics (esp. Septra/Bactrim, Flagyl, Cipro) Amiodarone (Cordarone) Aspirin (ASA) Cimetidine (Tagamet) Megestrol (Megace) NSAIDs (ibuprofen,  naproxen, etc.) Piroxicam (Feldene) Propafenone (Rythmol SR) Propranolol (Inderal) Isoniazid (INH) Posaconazole (Noxafil) Barbiturates (Phenobarbital) Carbamazepine (Tegretol) Chlordiazepoxide (Librium) Cholestyramine (Questran) Griseofulvin Oral Contraceptives Rifampin Sucralfate (Carafate) Vitamin K   Coumadin (Warfarin) Major Herbal Interactions  Increased Warfarin Effect Decreased Warfarin Effect    Garlic Ginseng Ginkgo biloba Coenzyme Q10 Green tea St. Johns wort    Coumadin (Warfarin) FOOD Interactions  Eat a consistent number of servings per week of foods HIGH in Vitamin K (1 serving =  cup)  Collards (cooked, or boiled & drained) Kale (cooked, or boiled & drained) Mustard greens (cooked, or boiled & drained) Parsley *serving size only =  cup Spinach (cooked, or boiled & drained) Swiss chard (cooked, or boiled & drained) Turnip greens (cooked, or boiled & drained)  Eat a consistent number of servings per week of foods MEDIUM-HIGH in Vitamin K (1 serving = 1 cup)  Asparagus (cooked, or boiled & drained) Broccoli (cooked, boiled & drained, or raw & chopped) Brussel sprouts (cooked, or boiled & drained) *serving size only =  cup Lettuce, raw (green leaf, endive, romaine) Spinach, raw Turnip greens, raw & chopped   These websites have more information on Coumadin (warfarin):  FailFactory.se; VeganReport.com.au;    Chest Pain (Nonspecific) It is often hard to give a specific diagnosis for the cause of chest pain. There is always a chance that your pain could be related to something serious, such as a heart attack or a blood clot in the lungs. You need to follow up with your health care provider for further evaluation. CAUSES   Heartburn.  Pneumonia or bronchitis.  Anxiety or stress.  Inflammation around your heart (pericarditis) or lung (pleuritis or pleurisy).  A blood clot in the lung.  A collapsed lung (pneumothorax). It can develop suddenly on its own (spontaneous pneumothorax) or from trauma to the chest.  Shingles infection (herpes zoster virus). The chest wall is composed of bones, muscles, and cartilage. Any of these can be the source of the pain.  The bones can be bruised by injury.  The muscles or cartilage can be strained by coughing or overwork.  The cartilage can be affected by inflammation and become sore  (costochondritis). DIAGNOSIS  Lab tests or other studies may be needed to find the cause of your pain. Your health care provider may have you take a test called an ambulatory electrocardiogram (ECG). An ECG records your heartbeat patterns over a 24-hour period. You may also have other tests, such as:  Transthoracic echocardiogram (TTE). During echocardiography, sound waves are used to evaluate how blood flows through your heart.  Transesophageal echocardiogram (TEE).  Cardiac monitoring. This allows your health care provider to monitor your heart rate and rhythm in real time.  Holter monitor. This is a portable device that records your heartbeat and can help diagnose heart arrhythmias. It allows your health care provider to track your heart activity for several days, if needed.  Stress tests by exercise or by giving medicine that makes the heart beat faster. TREATMENT   Treatment depends on what may be causing your chest pain. Treatment may include:  Acid blockers for heartburn.  Anti-inflammatory medicine.  Pain medicine for inflammatory conditions.  Antibiotics if an infection is present.  You may be advised to change lifestyle habits. This includes stopping smoking and avoiding alcohol, caffeine, and chocolate.  You may be advised to keep your head raised (elevated) when sleeping. This reduces the chance of acid going backward from your stomach into your esophagus. Most of  the time, nonspecific chest pain will improve within 2-3 days with rest and mild pain medicine.  HOME CARE INSTRUCTIONS   If antibiotics were prescribed, take them as directed. Finish them even if you start to feel better.  For the next few days, avoid physical activities that bring on chest pain. Continue physical activities as directed.  Do not use any tobacco products, including cigarettes, chewing tobacco, or electronic cigarettes.  Avoid drinking alcohol.  Only take medicine as directed by your health  care provider.  Follow your health care provider's suggestions for further testing if your chest pain does not go away.  Keep any follow-up appointments you made. If you do not go to an appointment, you could develop lasting (chronic) problems with pain. If there is any problem keeping an appointment, call to reschedule. SEEK MEDICAL CARE IF:   Your chest pain does not go away, even after treatment.  You have a rash with blisters on your chest.  You have a fever. SEEK IMMEDIATE MEDICAL CARE IF:   You have increased chest pain or pain that spreads to your arm, neck, jaw, back, or abdomen.  You have shortness of breath.  You have an increasing cough, or you cough up blood.  You have severe back or abdominal pain.  You feel nauseous or vomit.  You have severe weakness.  You faint.  You have chills. This is an emergency. Do not wait to see if the pain will go away. Get medical help at once. Call your local emergency services (911 in [place] Do not drive yourself to the hospital. MAKE SURE YOU:   Understand these instructions.  Will watch your condition.  Will get help right away if you are not doing well or get worse. Document Released: 01/26/2005 Document Revised: 04/23/2013 Document Reviewed: 11/22/2007 [facility] Patient Information 2015 [place], [place]. This information is not intended to replace advice given to you by your health care provider. Make sure you discuss any questions you have with your health care provider.

## 2014-06-12 NOTE — Progress Notes (Signed)
lexiscan myoview completed without complications  

## 2014-06-24 ENCOUNTER — Ambulatory Visit (INDEPENDENT_AMBULATORY_CARE_PROVIDER_SITE_OTHER): Payer: Medicare Other | Admitting: Pharmacist Clinician (PhC)/ Clinical Pharmacy Specialist

## 2014-06-24 DIAGNOSIS — I4891 Unspecified atrial fibrillation: Secondary | ICD-10-CM

## 2014-06-24 LAB — POCT INR: INR: 2.7

## 2014-07-07 ENCOUNTER — Telehealth: Payer: Self-pay | Admitting: Cardiology

## 2014-07-07 NOTE — Telephone Encounter (Signed)
Appointment 3/10 not 2/10

## 2014-07-07 NOTE — Telephone Encounter (Signed)
Patient has had some nausea for a while, denied chest pain at onset Stays cold Will discuss with  Dr. Mare Ferrari at [facility] 06/11/14

## 2014-07-07 NOTE — Telephone Encounter (Signed)
New message    Patient calling C/O sick stomach, legs hurting / cold.

## 2014-07-07 NOTE — Telephone Encounter (Signed)
Agree with plan 

## 2014-07-08 ENCOUNTER — Ambulatory Visit (INDEPENDENT_AMBULATORY_CARE_PROVIDER_SITE_OTHER): Payer: Medicare Other | Admitting: *Deleted

## 2014-07-08 DIAGNOSIS — I442 Atrioventricular block, complete: Secondary | ICD-10-CM

## 2014-07-08 LAB — MDC_IDC_ENUM_SESS_TYPE_REMOTE
Battery Voltage: 2.95 V
Brady Statistic RV Percent Paced: 99 %
Date Time Interrogation Session: 20160308132808
Implantable Pulse Generator Model: 2110
Implantable Pulse Generator Serial Number: 7282780
Lead Channel Impedance Value: 490 Ohm
Lead Channel Setting Pacing Amplitude: 2.5 V
Lead Channel Setting Sensing Sensitivity: 2 mV
MDC IDC MSMT BATTERY REMAINING LONGEVITY: 101 mo
MDC IDC MSMT BATTERY REMAINING PERCENTAGE: 74 %
MDC IDC MSMT LEADCHNL RV PACING THRESHOLD AMPLITUDE: 0.75 V
MDC IDC MSMT LEADCHNL RV PACING THRESHOLD PULSEWIDTH: 0.4 ms
MDC IDC MSMT LEADCHNL RV SENSING INTR AMPL: 12 mV
MDC IDC SET LEADCHNL RV PACING PULSEWIDTH: 0.4 ms

## 2014-07-08 NOTE — Progress Notes (Signed)
Remote pacemaker transmission.   

## 2014-07-10 ENCOUNTER — Telehealth: Payer: Self-pay | Admitting: Family Medicine

## 2014-07-10 ENCOUNTER — Encounter: Payer: Self-pay | Admitting: Cardiology

## 2014-07-10 ENCOUNTER — Ambulatory Visit (INDEPENDENT_AMBULATORY_CARE_PROVIDER_SITE_OTHER): Payer: Medicare Other | Admitting: Cardiology

## 2014-07-10 VITALS — BP 102/60 | HR 67 | Ht 71.0 in | Wt 179.0 lb

## 2014-07-10 DIAGNOSIS — I259 Chronic ischemic heart disease, unspecified: Secondary | ICD-10-CM

## 2014-07-10 DIAGNOSIS — I442 Atrioventricular block, complete: Secondary | ICD-10-CM

## 2014-07-10 DIAGNOSIS — R110 Nausea: Secondary | ICD-10-CM

## 2014-07-10 DIAGNOSIS — I481 Persistent atrial fibrillation: Secondary | ICD-10-CM

## 2014-07-10 DIAGNOSIS — I4819 Other persistent atrial fibrillation: Secondary | ICD-10-CM

## 2014-07-10 DIAGNOSIS — I10 Essential (primary) hypertension: Secondary | ICD-10-CM

## 2014-07-10 NOTE — Telephone Encounter (Signed)
Patient aware that if he starts having any nausea, vomiting, confusion or his headache gets worse then he needs to be evaluated. Patient states he hit his head on a corner and has a dull headache.

## 2014-07-10 NOTE — Patient Instructions (Signed)
Try Dramamine OTC for nausea  Your physician recommends that you schedule a follow-up appointment in: mid June for ov and fasting labs (lp/bmet/hfp/cbc)
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
## 2014-07-11 ENCOUNTER — Emergency Department (HOSPITAL_COMMUNITY): Payer: Medicare Other

## 2014-07-11 ENCOUNTER — Encounter (HOSPITAL_COMMUNITY): Payer: Self-pay | Admitting: *Deleted

## 2014-07-11 ENCOUNTER — Emergency Department (HOSPITAL_COMMUNITY)
Admission: EM | Admit: 2014-07-11 | Discharge: 2014-07-11 | Disposition: A | Discharge status: Home / Self Care | Payer: Medicare Other | Attending: Emergency Medicine | Admitting: Emergency Medicine

## 2014-07-11 DIAGNOSIS — Z87891 Personal history of nicotine dependence: Secondary | ICD-10-CM | POA: Insufficient documentation

## 2014-07-11 DIAGNOSIS — Z950 Presence of cardiac pacemaker: Secondary | ICD-10-CM | POA: Insufficient documentation

## 2014-07-11 DIAGNOSIS — Z951 Presence of aortocoronary bypass graft: Secondary | ICD-10-CM | POA: Insufficient documentation

## 2014-07-11 DIAGNOSIS — F410 Panic disorder [episodic paroxysmal anxiety] without agoraphobia: Secondary | ICD-10-CM | POA: Diagnosis not present

## 2014-07-11 DIAGNOSIS — Z85828 Personal history of other malignant neoplasm of skin: Secondary | ICD-10-CM | POA: Diagnosis not present

## 2014-07-11 DIAGNOSIS — I5022 Chronic systolic (congestive) heart failure: Secondary | ICD-10-CM | POA: Insufficient documentation

## 2014-07-11 DIAGNOSIS — Y9289 Other specified places as the place of occurrence of the external cause: Secondary | ICD-10-CM | POA: Diagnosis not present

## 2014-07-11 DIAGNOSIS — Q6100 Congenital renal cyst, unspecified: Secondary | ICD-10-CM | POA: Insufficient documentation

## 2014-07-11 DIAGNOSIS — E785 Hyperlipidemia, unspecified: Secondary | ICD-10-CM | POA: Diagnosis not present

## 2014-07-11 DIAGNOSIS — I2510 Atherosclerotic heart disease of native coronary artery without angina pectoris: Secondary | ICD-10-CM | POA: Diagnosis not present

## 2014-07-11 DIAGNOSIS — Y9389 Activity, other specified: Secondary | ICD-10-CM | POA: Insufficient documentation

## 2014-07-11 DIAGNOSIS — Z9889 Other specified postprocedural states: Secondary | ICD-10-CM | POA: Insufficient documentation

## 2014-07-11 DIAGNOSIS — Z7901 Long term (current) use of anticoagulants: Secondary | ICD-10-CM | POA: Insufficient documentation

## 2014-07-11 DIAGNOSIS — I4891 Unspecified atrial fibrillation: Secondary | ICD-10-CM | POA: Insufficient documentation

## 2014-07-11 DIAGNOSIS — W228XXA Striking against or struck by other objects, initial encounter: Secondary | ICD-10-CM | POA: Diagnosis not present

## 2014-07-11 DIAGNOSIS — F329 Major depressive disorder, single episode, unspecified: Secondary | ICD-10-CM | POA: Insufficient documentation

## 2014-07-11 DIAGNOSIS — K219 Gastro-esophageal reflux disease without esophagitis: Secondary | ICD-10-CM | POA: Insufficient documentation

## 2014-07-11 DIAGNOSIS — Y998 Other external cause status: Secondary | ICD-10-CM | POA: Diagnosis not present

## 2014-07-11 DIAGNOSIS — S0990XA Unspecified injury of head, initial encounter: Secondary | ICD-10-CM | POA: Diagnosis present

## 2014-07-11 DIAGNOSIS — Z79899 Other long term (current) drug therapy: Secondary | ICD-10-CM | POA: Diagnosis not present

## 2014-07-11 LAB — PROTIME-INR
INR: 2.22 — AB (ref 0.00–1.49)
Prothrombin Time: 24.8 seconds — ABNORMAL HIGH (ref 11.6–15.2)

## 2014-07-11 NOTE — ED Provider Notes (Signed)
CSN: [id number]     Arrival date & time 07/11/14  0907 History   First MD Initiated Contact with Patient 07/11/14 [id number]     Chief Complaint  Patient presents with  . Fall      HPI Pt was seen at 43. Per pt, c/o sudden onset and resolution of one episode of head injury that occurred yesterday at home. Pt states he was bending over into a closet to get a trash bag, and when he stood up and "hit my head on a board." Denies LOC, no AMS. Pt c/o continued "pain" on his scalp where he hit it against the board. Pt called his PMD to be evaluated, and was told to come to the ED. Denies any other injury or complaints. Denies visual changes, no focal motor weakness, no tingling/numbness in extremities, no facial droop, no slurred speech, no neck or back pain.    Past Medical History  Diagnosis Date  . Ischemic cardiomyopathy     a. 06/2012 EF 25% by echo.  . Hypertension   . Hyperlipidemia   . GERD (gastroesophageal reflux disease)   . CAD (coronary artery disease)     a. 1991 s/p CABGx4;  b. redo CABG in 2007 (SVG->OM, VG->PDA->PLV);  c. 01/2011 Cath: LM 80ost, LAD 100p, LCX 100ost, OM1 100, RCA 100, LIMA->LAD ok, VG->PDA ok, VG->OM ok;  d. 03/2012 MV: EF 32% inf & inflat scar w/o ischemia; e. Lexi MV: EF 33%, inf infarct, no ischemia. f. lexiscan 06/12/2014 RCA scar, no ischemia  . Atrial flutter     a. 01/2011 s/p unsuccessful RFCA complicated by CHB req PPM.  . CHB (complete heart block)     a. 01/2011: in setting of RFCA, s/p SJM 2110 [employer] DC PPM, ser # [id number].  [name] Kitchen Kidney cysts     a. bilateral  . Hiatal hernia     a. 03/01/2013 EGD and esoph dil.  [name] Kitchen BPH (benign prostatic hyperplasia)     a. elevated PSA  . A-fib     a. chronic coumadin  . Elevated PSA, less than 10 ng/ml   . Fibromyalgia   . Skin cancer     'above left eyebrow; tip of my nose" (08/29/2012)  . Depression     "since 1986-1987" (08/29/2012)  . Anxiety   . Panic attacks   . Chronic systolic CHF (congestive heart  failure), NYHA class 2     a. 06/2012 Echo: EF 25%, distal sept and apical AK, sev dil LV, mild LVH, mod dil LA.  [name] Kitchen Pacemaker    Past Surgical History  Procedure Laterality Date  . Cardiac catheterization  10/30/2009    Grafts patent. Mild to moderate LV dysfunction. EF 45%. Managed medically.   . Cardiac catheterization      multiple  . Tonsillectomy  1960  . Cholecystectomy  2007  . Insert / replace / remove pacemaker  01/2011    initial placement  . Inguinal hernia repair  1960's    "left I think"  . Cataract extraction w/ intraocular lens  implant, bilateral  2012  . Cardioversion  05/25/2012    Procedure: CARDIOVERSION;  Surgeon: Darlin Coco, MD;  Location: [facility] ENDOSCOPY;  Service: Cardiovascular;  Laterality: N/A;  . Humerus surgery Left 1960    "growth on the bone taken off; not cancer" (08/29/2012)  . Excisional hemorrhoidectomy  ~ 1980  . Skin cancer excision      'above left eyebrow; tip of my nose" (08/29/2012)  . Atrial flutter ablation  01/2011    "didn't work" (08/29/2012)  . Coronary artery bypass graft  08/27/1989    CABG X 5  . Coronary artery bypass graft  06/2005    CABG X 3   Family History  Problem Relation Age of Onset  . Stroke Mother   . Heart attack Father     died @ 62, mult MI's.  [name] Kitchen Heart disease Father   . Hypertension Father   . Diabetes Sister    History  Substance Use Topics  . Smoking status: Former Smoker -- 0.50 packs/day for 10 years    Types: Cigarettes    Quit date: 09/21/1973  . Smokeless tobacco: Never Used  . Alcohol Use: No    Review of Systems ROS: Statement: All systems negative except as marked or noted in the HPI; Constitutional: Negative for fever and chills. ; ; Eyes: Negative for eye pain, redness and discharge. ; ; ENMT: Negative for ear pain, hoarseness, nasal congestion, sinus pressure and sore throat. ; ; Cardiovascular: Negative for chest pain, palpitations, diaphoresis, dyspnea and peripheral edema. ; ; Respiratory:  Negative for cough, wheezing and stridor. ; ; Gastrointestinal: Negative for nausea, vomiting, diarrhea, abdominal pain, blood in stool, hematemesis, jaundice and rectal bleeding. . ; ; Genitourinary: Negative for dysuria, flank pain and hematuria. ; ; Musculoskeletal: +head injury. Negative for back pain and neck pain. Negative for swelling and deformity.; ; Skin: Negative for pruritus, rash, abrasions, blisters, bruising and skin lesion.; ; Neuro: Negative for lightheadedness and neck stiffness. Negative for weakness, altered level of consciousness , altered mental status, extremity weakness, paresthesias, involuntary movement, seizure and syncope.      Allergies  Sulfa drugs cross reactors; Ultram; Xarelto; and Zithromax  Home Medications   Prior to Admission medications   Medication Sig Start Date End Date Taking? Authorizing Provider  acetaminophen (TYLENOL) 500 MG tablet Take 500 mg by mouth every 8 (eight) hours as needed.    Historical Provider, MD  ALPRAZolam Duanne Moron) 1 MG tablet Take 1 tablet (1 mg total) by mouth 3 (three) times daily as needed. For anxiety 01/16/14   Darlin Coco, MD  atorvastatin (LIPITOR) 40 MG tablet TAKE 1 BY MOUTH ALTERNATING WITH 1/2   TABLET EVERY OTHER DAY AS DIRECTED 04/08/14   Darlin Coco, MD  carvedilol (COREG) 6.25 MG tablet Take 1 tablet (6.25 mg total) by mouth 2 (two) times daily. 12/25/13   Darlin Coco, MD  DimenhyDRINATE (DRAMAMINE PO) Take by mouth as needed.    Historical Provider, MD  doxepin (SINEQUAN) 25 MG capsule 1 capsule every morning and 2 capsules at night 01/16/14   Darlin Coco, MD  gabapentin (NEURONTIN) 100 MG capsule TAKE 1 BY MOUTH 4 TIMES DAILY 04/07/14   Darlin Coco, MD  hydroxypropyl methylcellulose (ISOPTO TEARS) 2.5 % ophthalmic solution Place 1 drop into both eyes 4 (four) times daily as needed. Dry eyes    Historical Provider, MD  isosorbide mononitrate (IMDUR) 60 MG 24 hr tablet TAKE 1 BY MOUTH DAILY 04/04/14    Darlin Coco, MD  lisinopril (PRINIVIL,ZESTRIL) 10 MG tablet Take 10 mg by mouth as directed. 1/2 tablet in the morning and 1 at bedtime    Historical Provider, MD  loratadine (CLARITIN) 10 MG tablet Take 10 mg by mouth daily.    Historical Provider, MD  Multiple Vitamin (MULTIVITAMIN) tablet Take 1 tablet by mouth daily.      Historical Provider, MD  nitroGLYCERIN (NITROSTAT) 0.4 MG SL tablet Place 1 tablet (0.4 mg total) under  the tongue every 5 (five) minutes as needed. For chest pain 07/05/13   Darlin Coco, MD  omeprazole (PRILOSEC) 20 MG capsule Take 1 capsule (20 mg total) by mouth 2 (two) times daily. 03/25/13   Darlin Coco, MD  polyethylene glycol [facility] / GLYCOLAX) packet Take 17 g by mouth daily.     Historical Provider, MD  warfarin (COUMADIN) 5 MG tablet Take 1 tablet (5 mg total) by mouth at bedtime. 05/31/14   Chipper Herb, MD   BP 119/61 mmHg  Pulse 63  Temp(Src) 98.2 F (36.8 C) (Oral)  Resp 16  Ht 5\' 11"  (1.803 m)  Wt 180 lb (81.647 kg)  BMI 25.12 kg/m2  SpO2 100% Physical Exam  1005: Physical examination:  Nursing notes reviewed; Vital signs and O2 SAT reviewed;  Constitutional: Well developed, Well nourished, Well hydrated, In no acute distress; Head:  Normocephalic, atraumatic; Eyes: EOMI, PERRL, No scleral icterus; ENMT: Mouth and pharynx normal, Mucous membranes moist; Neck: Supple, Full range of motion, No lymphadenopathy; Cardiovascular: Regular rate and rhythm, No gallop; Respiratory: Breath sounds clear & equal bilaterally, No wheezes.  Speaking full sentences with ease, Normal respiratory effort/excursion; Chest: Nontender, Movement normal; Abdomen: Soft, Nontender, Nondistended, Normal bowel sounds; Genitourinary: No CVA tenderness; Spine:  No midline CS, TS, LS tenderness.;; Extremities: Pulses normal, No tenderness, No edema, No calf edema or asymmetry.; Neuro: AA&Ox3, Major CN grossly intact.  Speech clear. No gross focal motor or sensory deficits in  extremities. Climbs on and off stretcher easily by himself. Gait steady.; Skin: Color normal, Warm, Dry.    ED Course  Procedures     EKG Interpretation None      MDM  MDM Reviewed: previous chart, nursing note and vitals Interpretation: CT scan and labs     Results for orders placed or performed during the hospital encounter of 07/11/14  Protime-INR  Result Value Ref Range   Prothrombin Time 24.8 (H) 11.6 - 15.2 seconds   INR 2.22 (H) 0.00 - 1.49   Ct Head Wo Contrast 07/11/2014   CLINICAL DATA:  Recent head injury, no loss of consciousness, initial evaluation  EXAM: CT HEAD WITHOUT CONTRAST  CT CERVICAL SPINE WITHOUT CONTRAST  TECHNIQUE: Multidetector CT imaging of the head and cervical spine was performed following the standard protocol without intravenous contrast. Multiplanar CT image reconstructions of the cervical spine were also generated.  COMPARISON:  02/25/2014  FINDINGS: CT HEAD FINDINGS  The bony calvarium is intact. The ventricles are of normal size and configuration. No findings to suggest acute hemorrhage, acute infarction or space-occupying mass lesion are noted.  CT CERVICAL SPINE FINDINGS  Seven cervical segments are well visualized. Vertebral body height is well maintained. Facet hypertrophic changes are noted particularly on the left. No acute fracture or acute facet abnormality is noted. The surrounding soft tissue structures show no acute abnormality.  IMPRESSION: CT of the head:  No acute intracranial abnormality noted.  CT of the cervical spine: Degenerative change without acute abnormality.   Electronically Signed   By: Inez Catalina M.D.   On: 07/11/2014 11:03   Ct Cervical Spine Wo Contrast 07/11/2014   CLINICAL DATA:  Recent head injury, no loss of consciousness, initial evaluation  EXAM: CT HEAD WITHOUT CONTRAST  CT CERVICAL SPINE WITHOUT CONTRAST  TECHNIQUE: Multidetector CT imaging of the head and cervical spine was performed following the standard protocol  without intravenous contrast. Multiplanar CT image reconstructions of the cervical spine were also generated.  COMPARISON:  02/25/2014  FINDINGS: CT HEAD FINDINGS  The bony calvarium is intact. The ventricles are of normal size and configuration. No findings to suggest acute hemorrhage, acute infarction or space-occupying mass lesion are noted.  CT CERVICAL SPINE FINDINGS  Seven cervical segments are well visualized. Vertebral body height is well maintained. Facet hypertrophic changes are noted particularly on the left. No acute fracture or acute facet abnormality is noted. The surrounding soft tissue structures show no acute abnormality.  IMPRESSION: CT of the head:  No acute intracranial abnormality noted.  CT of the cervical spine: Degenerative change without acute abnormality.   Electronically Signed   By: Inez Catalina M.D.   On: 07/11/2014 11:03    1215:  Pt states he "feels fine" and would like to go home now. INR and CT reassuring. Dx and testing d/w pt. Questions answered.  Verb understanding, agreeable to d/c home with outpt f/u.     Francine Graven, DO 07/13/14 [phone]

## 2014-07-11 NOTE — ED Notes (Signed)
Pt states he was raising up from a squatting position and hit his head on a board, which then knocked him down. Pt states he was almost knocked out. Pt states he was awakened from sleep due to severe head pain. Pt went to PCP office and was told to come here instead.

## 2014-07-11 NOTE — ED Notes (Signed)
MD at bedside. 

## 2014-07-11 NOTE — Discharge Instructions (Signed)
°Emergency Department Resource Guide °1) Find a Doctor and Pay Out of Pocket °Although you won't have to find out who is covered by your insurance plan, it is a good idea to ask around and get recommendations. You will then need to call the office and see if the doctor you have chosen will accept you as a new patient and what types of options they offer for patients who are self-pay. Some doctors offer discounts or will set up payment plans for their patients who do not have insurance, but you will need to ask so you aren't surprised when you get to your appointment. ° °2) Contact Your Local Health Department °Not all health departments have doctors that can see patients for sick visits, but many do, so it is worth a call to see if yours does. If you don't know where your local health department is, you can check in your phone book. The CDC also has a tool to help you locate your state's health department, and many state websites also have listings of all of their local health departments. ° °3) Find a Walk-in Clinic °If your illness is not likely to be very severe or complicated, you may want to try a walk in clinic. These are popping up all over the country in pharmacies, drugstores, and shopping centers. They're usually staffed by nurse practitioners or physician assistants that have been trained to treat common illnesses and complaints. They're usually fairly quick and inexpensive. However, if you have serious medical issues or chronic medical problems, these are probably not your best option. ° °No Primary Care Doctor: °- Call [facility] at  832-8000 - they can help you locate a primary care doctor that  accepts your insurance, provides certain services, etc. °- Physician Referral Service- 1-800-533-3463 ° °Chronic Pain Problems: °Organization         Address  Phone   Notes  °[facility]  (336) 297-2271 Patients need to be referred by their primary care doctor.  ° °Medication  Assistance: °Organization         Address  Phone   Notes  °[place] [facility] [address] °[place], [place] [address] (336) 641-8030 --Must be a resident of [place] °-- Must have NO insurance coverage whatsoever (no Medicaid/ Medicare, etc.) °-- The pt. MUST have a primary care doctor that directs their care regularly and follows them in the community °  °[facility]  (866) 331-1348   °[employer]  (888) 892-1162   ° °Agencies that provide inexpensive medical care: °Organization         Address  Phone   Notes  °[facility]  (336) 832-8035   °[facility]    (336) 832-7272   °[facility] [address] °[place], [place] [address] (336) 832-4777   °[facility] [address], °[place] (336) 271-4999   °[facility]    (336) 373-0678   °[facility]    (336) 272-1050   °[facility] ° [address], [place] Phone:  (336) 832-4444, Fax:  (336) 832-4440 Hours of Operation:  9 am - 6 pm, M-F.  Also accepts Medicaid/Medicare and self-pay.  °[facility] ° [address], [place] Phone: (336) 832-3150, Fax: (336) [phone]. Hours of Operation:  8:30 am - 5:30 pm, M-F.  Also accepts Medicaid and self-pay.  °[facility] [place] [address], [place] Phone: (336) 878-6027   °[facility] [address], [place], [place] (336)723-1848, Ext. 123 Mondays & Thursdays: 7-9 AM.  First 15 patients are seen on a first come, first serve basis. °  ° °Medicaid-accepting [place] Providers: ° °Organization         Address  Phone   Notes  °[facility] [address], [place] (336) 641-2100 Also accepts self-pay patients.  °[facility] [address], [place] ° (336) 856-9996   °[facility] [address], [place]  (336) 288-8857   °[facility] [address] [place] [address], [place] (336) 299-7000   °Veita Bland [address], [place]  ° (336) 373-1557 Only accepts [place] Access Medicaid patients after they have their name applied to their card.  ° °Self-Pay (no insurance) in [place]: ° °Organization         Address  Phone   Notes  °Sickle Cell Patients, [facility] [address], [place] (336) 832-1970   °[facility] [address], [place] (336) 832-4400   °[facility] ° [address], [place] (336) 992-4800   °[facility]/Dr. Osei-Bonsu ° [address] [place] [address], [place] or [address], [place] (336) 841-8500 Phone number for both [place] and [place] locations is the same.  °[facility] [address], [place] (336) 299-0000   °[facility] [address] [place] [address], [place] or [address] (336) 852-7530 °(336) 878-2260   °[facility] [address], [place] (336) 350-1642, phone; (336) 294-5005, fax Sees patients 1st and 3rd Saturday of every month.  Must not qualify for public or private insurance (i.e. Medicaid, Medicare, [place] Health Choice, Veterans' Benefits) • Household income should be no more than 200% of the poverty level •The clinic cannot treat you if you are pregnant or think you are pregnant • Sexually transmitted diseases are not treated at the clinic.  ° ° °Dental Care: °Organization         Address  Phone  Notes  °[place] [facility] [address], [place] (336) 641-6152 Accepts children up to age 21 who are enrolled in Medicaid or [place] Health Choice; pregnant women with a Medicaid card; and children who have applied for Medicaid or [place] Health Choice, but were declined, whose parents can pay a reduced fee at time of service.  °[place]  [facility] [place]  [address], [place] (336) 641-7733 Accepts children up to age 21 who are enrolled in Medicaid or  Health Choice; pregnant women with a Medicaid card; and children who have applied for Medicaid or  Health Choice, but were declined, whose parents can pay a reduced fee at time of service.  °[facility] ° [address],  (336) 641-4533 Patients are seen by appointment only. Walk-ins are not accepted. [facility] will see patients 18 years of age and older. °Monday - Tuesday (8am-5pm) °Most Wednesdays (8:30-5pm) °$30 per visit, cash only  °[facility] ° [address], [place] (336) 641-4533 Patients are seen by appointment only. Walk-ins are not accepted. [facility] will see patients 18 years of age and older. °One   Wednesday Evening (Monthly: Volunteer Based).  $30 per visit, cash only  °[facility]  (919) 537-3737 for adults; Children under age 4, call Graduate Pediatric Dentistry at (919) [phone]. Children aged 4-14, please call (919) [phone] to request a pediatric application. ° Dental services are provided in all areas of dental care including fillings, crowns and bridges, complete and partial dentures, implants, gum treatment, root canals, and extractions. Preventive care is also provided. Treatment is provided to both adults and children. °Patients are selected via a lottery and there is often a waiting list. °  °[facility] [address], °[place] ° (336) 763-8833 www.drcivils.com °  °[facility] [address], [place], [place] (336)723-1848, Ext. 123 Second and Fourth Thursday of each month, opens at 6:30 AM; Clinic ends at 9 AM.  Patients are seen on a first-come first-served basis, and a limited number are seen during each clinic.  ° °[facility] ° [address], [place], [place] (336) [phone]    Eligibility Requirements °You must have lived in [place], [place], or [place] counties for at least the last three months. °  You cannot be eligible for state or federal sponsored healthcare insurance, including Veterans Administration, Medicaid, or Medicare. °  You generally cannot be eligible for healthcare insurance through your employer.  °  How to apply: °Eligibility screenings are held every Tuesday and Wednesday afternoon from 1:00 pm until 4:00 pm. You do not need an appointment for the interview!  °[facility] [address], [place], [place] 336-631-2330   °[facility]  336-342-8273   °[facility]  336-703-3100   °[facility]  336-570-6415   ° °Behavioral Health Resources in the Community: °Intensive Outpatient Programs °Organization         Address  Phone  Notes  °[facility] [address], [place], [place] 336-878-6098   °[facility] [address], [place], [place] 336-832-9800   °[facility] [address], [place], [place] ° 336-882-2125   °[facility] [address],  °[place], [place] 1-800-853-5163 or 336-641-4981   °Substance Abuse Resources °Organization         Address  Phone  Notes  °[facility]  336-882-2125   °[facility]  336-784-9470   °[facility] [place] [facility]  336-285-9073   °[facility]  336-845-3988   °Residential & Outpatient Substance Abuse Program  1-800-659-3381   °Psychological Services °Organization         Address  Phone  Notes  °[facility]  336- 832-9600   °[facility]  336- 378-7881   °[facility] [address], [place] 1-800-853-5163 or 336-641-4981   ° °Mobile Crisis Teams °Organization         Address  Phone  Notes  °[facility], Mobile Crisis Care Unit  1-877-626-1772   °Assertive °[facility] ° [address]  [place], [place] 336-834-9664   °Sharon DeEsch [address] °[place] 336-554-5454   ° °Self-Help/Support Groups °Organization         Address  Phone             Notes  °[facility] [place] - variety of support groups  336- 373-1402 Call for more information  °Narcotics Anonymous (NA), [facility] [address], °[place]  2 meetings at this location  ° °  Residential Treatment Programs Organization         Address  Phone  Notes  [facility] [address]    [place]  1-2196639647   [facility]  [address] [place] [address], [place], [place]   [place], [place] [phone] Admissions: 8am-3pm M-F  [facility] [place] [address]    [place], [place] 974-163-8453   [facility] [address] [place], [place], [place]   [facility] [address],  [place], [place]   [facility] - Intensive Outpatient [address], [place], [place]   [facility] [address], [place] 1-(620)209-7510 or 608-195-9754   [facility] [address], [place], [place] Accepts Medicaid  [facility] [address],  [place] 1-5190704721 Substance Abuse/Addiction Treatment   [facility]         Address  Phone  Notes  [facility]  726-577-1729   Domenic Schwab, PhD [address]a [place], [place]   (502)077-5730 or 214-085-3291   [address] [place], [place] (615) 287-2887   [facility] [address], [place], [place] 941-555-0923 Insurance/Medicaid/sponsorship through [facility] [address]                                    [place], [place] (503)[phone] [place] [address], [place] [phone]    Dr. Adele Schilder  [phone]   [facility] Dept. 1) [address], [place] 2) [place] 3)  [address], [place] [phone] [phone]  [phone]   [place] 5711672626 or [phone] (After Hours)      Take over the counter tylenol, as directed on packaging, as needed for discomfort.  Call your regular medical doctor today to schedule a follow up appointment within the next 3 days.  Return to the Emergency Department immediately sooner if worsening.

## 2014-07-17 ENCOUNTER — Encounter: Payer: Self-pay | Admitting: Cardiology

## 2014-07-21 ENCOUNTER — Telehealth: Payer: Self-pay | Admitting: Cardiology

## 2014-07-21 NOTE — Telephone Encounter (Signed)
New message       Has Dr Mare Ferrari every checked pt for diabetes?

## 2014-07-21 NOTE — Telephone Encounter (Signed)
Spoke with patient and advised blood sugar has been checked multiple times in the office, reading reviewed with patient

## 2014-07-23 ENCOUNTER — Encounter: Payer: Self-pay | Admitting: Internal Medicine

## 2014-07-24 ENCOUNTER — Telehealth: Payer: Self-pay | Admitting: Family Medicine

## 2014-07-24 NOTE — Telephone Encounter (Signed)
Patient states that he sees Dr Irving Shows and has a problem with toe fungus. He is pretty sure that is whats going on with his toe now. Offered patient appt for Saturday but advised him to contact Dr Drucilla Chalet office first and see what they wanted him to do and call back and let us know. Patient verbalized understanding.

## 2014-08-05 ENCOUNTER — Ambulatory Visit (INDEPENDENT_AMBULATORY_CARE_PROVIDER_SITE_OTHER): Payer: Medicare Other | Admitting: Pharmacist Clinician (PhC)/ Clinical Pharmacy Specialist

## 2014-08-05 DIAGNOSIS — I4891 Unspecified atrial fibrillation: Secondary | ICD-10-CM | POA: Diagnosis not present

## 2014-08-05 LAB — POCT INR: INR: 2.5

## 2014-08-08 ENCOUNTER — Telehealth: Payer: Self-pay | Admitting: Cardiology

## 2014-08-08 NOTE — Telephone Encounter (Signed)
New Message  Pt states that  ALPRAZolam Duanne Moron) 1 MG tablet is not on his list of medications for his insurance. Pt says that a form was sent in to have it filled out. Please call back to discuss

## 2014-08-08 NOTE — Telephone Encounter (Signed)
Form received and will fill out and fax back

## 2014-08-18 ENCOUNTER — Other Ambulatory Visit: Payer: Self-pay | Admitting: *Deleted

## 2014-08-18 ENCOUNTER — Telehealth: Payer: Self-pay | Admitting: Cardiology

## 2014-08-18 MED ORDER — WARFARIN SODIUM 5 MG PO TABS: 5.0000 mg | ORAL_TABLET | Freq: Every day | ORAL | Status: DC

## 2014-08-18 NOTE — Telephone Encounter (Signed)
New message      Pt c/o medication issue:  1. Name of Medication: generic xanax 2. How are you currently taking this medication (dosage and times per day)? 1mg   3. Are you having a reaction (difficulty breathing--STAT)? no 4. What is your medication issue? Need prior authorization

## 2014-08-21 ENCOUNTER — Telehealth: Payer: Self-pay | Admitting: *Deleted

## 2014-08-21 NOTE — Telephone Encounter (Signed)
Add lasix 20 mg daily prn.

## 2014-08-21 NOTE — Telephone Encounter (Signed)
Follow up    Patient calling C/O both ankle are swollen.

## 2014-08-21 NOTE — Telephone Encounter (Signed)
Has had some swelling in feet last 2-3 days Has gone down more today Denies any increased shortness of breath  States careful with sodium intake and keeps feet elevated  Wakes up with swelling  When presses above ankle is slow to come back Concerned needs diuretic  Will forward to  Dr. Mare Ferrari for review    Marcine Matar at 08/21/2014 9:33 AM     Status: Signed       Expand All Collapse All   Follow up    Patient calling C/O both ankle are swollen.

## 2014-08-22 MED ORDER — FUROSEMIDE 20 MG PO TABS: ORAL_TABLET | ORAL | Status: DC

## 2014-08-22 NOTE — Telephone Encounter (Signed)
Swelling issue see phone note from 4/21  Faxed prior auth request again

## 2014-08-22 NOTE — Telephone Encounter (Signed)
Advised patient and sent to pharmacy  

## 2014-08-22 NOTE — Telephone Encounter (Signed)
Follow up       Pt would like to know if his swelling in his right foot could cause a blood clot

## 2014-08-25 ENCOUNTER — Telehealth: Payer: Self-pay | Admitting: Cardiology

## 2014-08-25 NOTE — Telephone Encounter (Signed)
New message       Alprazolam has been approved for 1 year effective 08-22-14

## 2014-08-25 NOTE — Telephone Encounter (Signed)
Advised patient

## 2014-08-26 ENCOUNTER — Telehealth: Payer: Self-pay | Admitting: Cardiology

## 2014-08-26 NOTE — Telephone Encounter (Signed)
Patient having no other symptoms at this time

## 2014-08-26 NOTE — Telephone Encounter (Signed)
New message     Pt c/o swelling: STAT is pt has developed SOB within 24 hours  1. How long have you been experiencing swelling? 2 weeks  2. Where is the swelling located? ankles 3.  Are you currently taking a "fluid pill"? yes  4.  Are you currently SOB?  no  5.  Have you traveled recently?

## 2014-08-26 NOTE — Telephone Encounter (Signed)
Spoke with patient and advised to use Lasix daily for the next couple days and call back Friday with update  Patient verbalized understanding

## 2014-09-09 ENCOUNTER — Ambulatory Visit (INDEPENDENT_AMBULATORY_CARE_PROVIDER_SITE_OTHER): Payer: Medicare Other

## 2014-09-09 ENCOUNTER — Ambulatory Visit (INDEPENDENT_AMBULATORY_CARE_PROVIDER_SITE_OTHER): Payer: Medicare Other | Admitting: Family Medicine

## 2014-09-09 ENCOUNTER — Encounter: Payer: Self-pay | Admitting: Family Medicine

## 2014-09-09 VITALS — BP 131/68 | HR 76 | Temp 97.2°F | Ht 71.0 in | Wt 186.6 lb

## 2014-09-09 DIAGNOSIS — M25561 Pain in right knee: Secondary | ICD-10-CM | POA: Diagnosis not present

## 2014-09-09 DIAGNOSIS — M25551 Pain in right hip: Secondary | ICD-10-CM

## 2014-09-09 NOTE — Progress Notes (Signed)
Subjective:  Patient ID: [name], male    DOB: 19-Apr-1941  Age: 74 y.o. MRN: [id number]  CC: Hip Pain   HPI [name] presents for patient fell 17 hours ago. Which is approximately 9:00 last night. He went to sit down in a straight back chair and the next thing he knew he was laying on the ground. He landed on his right hip and twisted his knee and he doesn't remember hitting his head but felt pain in his head since that time. Pain in the right hip is a soreness and aching that is 4-5/10. He is able to ambulate. Similar pain in the right knee. The head is a dull ache as well and is just a little more intense at 6/10. It is not throbbing. It has not affected his vision or speech. He denies loss of consciousness. It was observed and there is no witness report of loss of consciousness.  History [name] has a past medical history of Ischemic cardiomyopathy; Hypertension; Hyperlipidemia; GERD (gastroesophageal reflux disease); CAD (coronary artery disease); Atrial flutter; CHB (complete heart block); Kidney cysts; Hiatal hernia; BPH (benign prostatic hyperplasia); A-fib; Elevated PSA, less than 10 ng/ml; Fibromyalgia; Skin cancer; Depression; Anxiety; Panic attacks; Chronic systolic CHF (congestive heart failure), NYHA class 2; and Pacemaker.   He has past surgical history that includes Cardiac catheterization (10/30/2009); Cardiac catheterization; Tonsillectomy (1960); Cholecystectomy (2007); Insert / replace / remove pacemaker (01/2011); Inguinal hernia repair (1960's); Cataract extraction w/ intraocular lens  implant, bilateral (2012); Cardioversion (05/25/2012); Humerus surgery (Left, 1960); Excisional hemorrhoidectomy (~ 1980); Skin cancer excision; Atrial flutter ablation (01/2011); Coronary artery bypass graft (08/27/1989); and Coronary artery bypass graft (06/2005).   His family history includes Diabetes in his sister; Heart attack in his father; Heart disease in his father; Hypertension in  his father; Stroke in his mother.He reports that he quit smoking about 40 years ago. His smoking use included Cigarettes. He has a 5 pack-year smoking history. He has never used smokeless tobacco. He reports that he does not drink alcohol or use illicit drugs.  Current Outpatient Prescriptions on File Prior to Visit  Medication Sig Dispense Refill  . acetaminophen (TYLENOL) 500 MG tablet Take 500 mg by mouth every 8 (eight) hours as needed.    . ALPRAZolam (XANAX) 1 MG tablet Take 1 tablet (1 mg total) by mouth 3 (three) times daily as needed. For anxiety 270 tablet 1  . atorvastatin (LIPITOR) 40 MG tablet TAKE 1 BY MOUTH ALTERNATING WITH 1/2   TABLET EVERY OTHER DAY AS DIRECTED 135 tablet 1  . carvedilol (COREG) 6.25 MG tablet Take 1 tablet (6.25 mg total) by mouth 2 (two) times daily. 180 tablet 3  . DimenhyDRINATE (DRAMAMINE PO) Take by mouth as needed.    . doxepin (SINEQUAN) 25 MG capsule 1 capsule every morning and 2 capsules at night 270 capsule 3  . furosemide (LASIX) 20 MG tablet One tablet by mouth daily as needed for swelling 30 tablet 5  . gabapentin (NEURONTIN) 100 MG capsule TAKE 1 BY MOUTH 4 TIMES DAILY 360 capsule 2  . hydroxypropyl methylcellulose (ISOPTO TEARS) 2.5 % ophthalmic solution Place 1 drop into both eyes 4 (four) times daily as needed. Dry eyes    . isosorbide mononitrate (IMDUR) 60 MG 24 hr tablet TAKE 1 BY MOUTH DAILY 90 tablet 1  . lisinopril (PRINIVIL,ZESTRIL) 10 MG tablet Take 10 mg by mouth as directed. 1/2 tablet in the morning and 1 at bedtime    . loratadine (  CLARITIN) 10 MG tablet Take 10 mg by mouth daily.    . Multiple Vitamin (MULTIVITAMIN) tablet Take 1 tablet by mouth daily.      . nitroGLYCERIN (NITROSTAT) 0.4 MG SL tablet Place 1 tablet (0.4 mg total) under the tongue every 5 (five) minutes as needed. For chest pain 100 tablet prn  . omeprazole (PRILOSEC) 20 MG capsule Take 1 capsule (20 mg total) by mouth 2 (two) times daily. 180 capsule 3  .  polyethylene glycol (MIRALAX / GLYCOLAX) packet Take 17 g by mouth daily.     [name] Kitchen warfarin (COUMADIN) 5 MG tablet Take 1 tablet (5 mg total) by mouth at bedtime. 90 tablet 0   No current facility-administered medications on file prior to visit.    ROS Review of Systems  Constitutional: Negative for fever, chills and diaphoresis.  HENT: Negative for congestion, rhinorrhea and sore throat.   Respiratory: Negative for cough, shortness of breath and wheezing.   Cardiovascular: Negative for chest pain.  Gastrointestinal: Negative for nausea, vomiting, abdominal pain, diarrhea, constipation and abdominal distention.  Genitourinary: Negative for dysuria and frequency.  Musculoskeletal: Positive for myalgias and arthralgias. Negative for joint swelling.  Skin: Negative for rash.  Neurological: Positive for headaches.    Objective:  BP 131/68 mmHg  Pulse 76  Temp(Src) 97.2 F (36.2 C) (Oral)  Ht 5\' 11"  (1.803 m)  Wt 186 lb 9.6 oz (84.641 kg)  BMI 26.04 kg/m2  Physical Exam  Constitutional: He is oriented to person, place, and time. He appears well-developed and well-nourished. No distress.  HENT:  Head: Normocephalic and atraumatic.  Right Ear: External ear normal.  Left Ear: External ear normal.  Nose: Nose normal.  Mouth/Throat: Oropharynx is clear and moist.  Eyes: Conjunctivae and EOM are normal. Pupils are equal, round, and reactive to light.  Neck: Normal range of motion. Neck supple. No thyromegaly present.  Cardiovascular: Normal rate, regular rhythm and normal heart sounds.   No murmur heard. Pulmonary/Chest: Effort normal and breath sounds normal. No respiratory distress. He has no wheezes. He has no rales.  Abdominal: Soft. Bowel sounds are normal. He exhibits no distension. There is no tenderness.  Musculoskeletal: He exhibits tenderness (greater trochanter of the right hip is tender to palpation. There is full passive and active range of motion. The knee is nontender and  also has full active and passive range of motion on the right. The scalp is tender without edema or erythema in the righ).  Lymphadenopathy:    He has no cervical adenopathy.  Neurological: He is alert and oriented to person, place, and time. He has normal reflexes. He displays normal reflexes. No cranial nerve deficit. He exhibits normal muscle tone. Coordination normal.  Skin: Skin is warm and dry.  Psychiatric: He has a normal mood and affect. His behavior is normal. Judgment and thought content normal.    Assessment & Plan:   [name] was seen today for hip pain.  Diagnoses and all orders for this visit:  Right hip pain Orders: -     DG HIP UNILAT WITH PELVIS 2-3 VIEWS RIGHT  Right knee pain Orders: -     DG Knee 1-2 Views Right   I am having [name] maintain his multivitamin, hydroxypropyl methylcellulose / hypromellose, loratadine, omeprazole, polyethylene glycol, acetaminophen, nitroGLYCERIN, carvedilol, ALPRAZolam, doxepin, isosorbide mononitrate, gabapentin, atorvastatin, lisinopril, DimenhyDRINATE (DRAMAMINE PO), warfarin, and furosemide.  No orders of the defined types were placed in this encounter.   Hip xr shows mild DJD. Knee  shows no acute change.  Follow-up: Return if symptoms worsen or fail to improve.  Claretta Fraise, M.D.

## 2014-09-11 ENCOUNTER — Ambulatory Visit (INDEPENDENT_AMBULATORY_CARE_PROVIDER_SITE_OTHER): Payer: Medicare Other | Admitting: Physician Assistant

## 2014-09-11 ENCOUNTER — Encounter: Payer: Self-pay | Admitting: Physician Assistant

## 2014-09-11 ENCOUNTER — Emergency Department (HOSPITAL_COMMUNITY)
Admission: EM | Admit: 2014-09-11 | Discharge: 2014-09-11 | Disposition: A | Discharge status: Home / Self Care | Payer: Medicare Other | Attending: Emergency Medicine | Admitting: Emergency Medicine

## 2014-09-11 ENCOUNTER — Encounter (HOSPITAL_COMMUNITY): Payer: Self-pay | Admitting: *Deleted

## 2014-09-11 ENCOUNTER — Emergency Department (HOSPITAL_COMMUNITY): Payer: Medicare Other

## 2014-09-11 ENCOUNTER — Telehealth: Payer: Self-pay | Admitting: Family Medicine

## 2014-09-11 VITALS — BP 136/82 | HR 75 | Temp 97.6°F | Ht 71.0 in | Wt 189.0 lb

## 2014-09-11 DIAGNOSIS — E785 Hyperlipidemia, unspecified: Secondary | ICD-10-CM | POA: Insufficient documentation

## 2014-09-11 DIAGNOSIS — S0990XA Unspecified injury of head, initial encounter: Secondary | ICD-10-CM | POA: Diagnosis present

## 2014-09-11 DIAGNOSIS — W01198A Fall on same level from slipping, tripping and stumbling with subsequent striking against other object, initial encounter: Secondary | ICD-10-CM | POA: Diagnosis not present

## 2014-09-11 DIAGNOSIS — G44319 Acute post-traumatic headache, not intractable: Secondary | ICD-10-CM

## 2014-09-11 DIAGNOSIS — Z7901 Long term (current) use of anticoagulants: Secondary | ICD-10-CM | POA: Diagnosis not present

## 2014-09-11 DIAGNOSIS — Q6100 Congenital renal cyst, unspecified: Secondary | ICD-10-CM | POA: Insufficient documentation

## 2014-09-11 DIAGNOSIS — R51 Headache: Secondary | ICD-10-CM

## 2014-09-11 DIAGNOSIS — Z87891 Personal history of nicotine dependence: Secondary | ICD-10-CM | POA: Diagnosis not present

## 2014-09-11 DIAGNOSIS — N400 Enlarged prostate without lower urinary tract symptoms: Secondary | ICD-10-CM | POA: Diagnosis not present

## 2014-09-11 DIAGNOSIS — Z85828 Personal history of other malignant neoplasm of skin: Secondary | ICD-10-CM | POA: Diagnosis not present

## 2014-09-11 DIAGNOSIS — F410 Panic disorder [episodic paroxysmal anxiety] without agoraphobia: Secondary | ICD-10-CM | POA: Diagnosis not present

## 2014-09-11 DIAGNOSIS — Z8739 Personal history of other diseases of the musculoskeletal system and connective tissue: Secondary | ICD-10-CM | POA: Diagnosis not present

## 2014-09-11 DIAGNOSIS — I5022 Chronic systolic (congestive) heart failure: Secondary | ICD-10-CM | POA: Insufficient documentation

## 2014-09-11 DIAGNOSIS — I10 Essential (primary) hypertension: Secondary | ICD-10-CM | POA: Diagnosis not present

## 2014-09-11 DIAGNOSIS — Z79899 Other long term (current) drug therapy: Secondary | ICD-10-CM | POA: Diagnosis not present

## 2014-09-11 DIAGNOSIS — Z951 Presence of aortocoronary bypass graft: Secondary | ICD-10-CM | POA: Insufficient documentation

## 2014-09-11 DIAGNOSIS — F329 Major depressive disorder, single episode, unspecified: Secondary | ICD-10-CM | POA: Insufficient documentation

## 2014-09-11 DIAGNOSIS — Y9389 Activity, other specified: Secondary | ICD-10-CM | POA: Diagnosis not present

## 2014-09-11 DIAGNOSIS — Y998 Other external cause status: Secondary | ICD-10-CM | POA: Insufficient documentation

## 2014-09-11 DIAGNOSIS — Y9289 Other specified places as the place of occurrence of the external cause: Secondary | ICD-10-CM | POA: Insufficient documentation

## 2014-09-11 DIAGNOSIS — J3089 Other allergic rhinitis: Secondary | ICD-10-CM | POA: Diagnosis not present

## 2014-09-11 DIAGNOSIS — I2510 Atherosclerotic heart disease of native coronary artery without angina pectoris: Secondary | ICD-10-CM | POA: Insufficient documentation

## 2014-09-11 DIAGNOSIS — Z950 Presence of cardiac pacemaker: Secondary | ICD-10-CM | POA: Diagnosis not present

## 2014-09-11 DIAGNOSIS — K219 Gastro-esophageal reflux disease without esophagitis: Secondary | ICD-10-CM | POA: Insufficient documentation

## 2014-09-11 DIAGNOSIS — R519 Headache, unspecified: Secondary | ICD-10-CM

## 2014-09-11 LAB — PROTIME-INR
INR: 1.67 — AB (ref 0.00–1.49)
Prothrombin Time: 19.9 seconds — ABNORMAL HIGH (ref 11.6–15.2)

## 2014-09-11 MED ORDER — FLUTICASONE PROPIONATE 50 MCG/ACT NA SUSP: 2.0000 | Freq: Every day | NASAL | Status: DC

## 2014-09-11 MED ORDER — MOMETASONE FUROATE 50 MCG/ACT NA SUSP: 2.0000 | Freq: Every day | NASAL | Status: DC

## 2014-09-11 NOTE — Telephone Encounter (Signed)
Appointment given for 11:30 with [facility]

## 2014-09-11 NOTE — Discharge Instructions (Signed)
As discussed, your evaluation today has been largely reassuring.  But, it is important that you monitor your condition carefully, and do not hesitate to return to the ED if you develop new, or concerning changes in your condition.  Your headache may be due to your recent trauma, although your description of ongoing sinus congestion suggests that this is less likely.  Please follow-up with your physician for appropriate ongoing care.

## 2014-09-11 NOTE — ED Notes (Signed)
Pt states he fell and hit his head on Monday. Seen by PCP Tuesday due to fall with right hip and right knee pain. States continued head pain today and PCP told him to come to the ED. No neuro deficits at this time.

## 2014-09-11 NOTE — ED Provider Notes (Signed)
CSN: [id number]     Arrival date & time 09/11/14  1349 History   First MD Initiated Contact with Patient 09/11/14 1600     Chief Complaint  Patient presents with  . Headache    HPI  Present concern of headache. Patient had a fall 4 days ago. He recalls the entirety of the event, struck his head after having a mechanical fall. Initially he had pain in his right hip, right knee.  This complains have subsided, and he has already been seen by his physician once this week. Today, with continued pain in the left head, the patient was referred here for evaluation. Patient takes all medication as directed, including Coumadin. He complains of ongoing frontal congestion, drainage, full sensation, but no visual changes, no difficulty swallowing or speaking. No fever, chills. Patient has had minimal relief with OTC medication. No extremity weakness.   Past Medical History  Diagnosis Date  . Ischemic cardiomyopathy     a. 06/2012 EF 25% by echo.  . Hypertension   . Hyperlipidemia   . GERD (gastroesophageal reflux disease)   . CAD (coronary artery disease)     a. 1991 s/p CABGx4;  b. redo CABG in 2007 (SVG->OM, VG->PDA->PLV);  c. 01/2011 Cath: LM 80ost, LAD 100p, LCX 100ost, OM1 100, RCA 100, LIMA->LAD ok, VG->PDA ok, VG->OM ok;  d. 03/2012 MV: EF 32% inf & inflat scar w/o ischemia; e. Lexi MV: EF 33%, inf infarct, no ischemia. f. lexiscan 06/12/2014 RCA scar, no ischemia  . Atrial flutter     a. 01/2011 s/p unsuccessful RFCA complicated by CHB req PPM.  . CHB (complete heart block)     a. 01/2011: in setting of RFCA, s/p SJM 2110 [employer] DC PPM, ser # [id number].  [name] Kitchen Kidney cysts     a. bilateral  . Hiatal hernia     a. 03/01/2013 EGD and esoph dil.  [name] Kitchen BPH (benign prostatic hyperplasia)     a. elevated PSA  . A-fib     a. chronic coumadin  . Elevated PSA, less than 10 ng/ml   . Fibromyalgia   . Skin cancer     'above left eyebrow; tip of my nose" (08/29/2012)  . Depression     "since  1986-1987" (08/29/2012)  . Anxiety   . Panic attacks   . Chronic systolic CHF (congestive heart failure), NYHA class 2     a. 06/2012 Echo: EF 25%, distal sept and apical AK, sev dil LV, mild LVH, mod dil LA.  [name] Kitchen Pacemaker    Past Surgical History  Procedure Laterality Date  . Cardiac catheterization  10/30/2009    Grafts patent. Mild to moderate LV dysfunction. EF 45%. Managed medically.   . Cardiac catheterization      multiple  . Tonsillectomy  1960  . Cholecystectomy  2007  . Insert / replace / remove pacemaker  01/2011    initial placement  . Inguinal hernia repair  1960's    "left I think"  . Cataract extraction w/ intraocular lens  implant, bilateral  2012  . Cardioversion  05/25/2012    Procedure: CARDIOVERSION;  Surgeon: Darlin Coco, MD;  Location: [facility] ENDOSCOPY;  Service: Cardiovascular;  Laterality: N/A;  . Humerus surgery Left 1960    "growth on the bone taken off; not cancer" (08/29/2012)  . Excisional hemorrhoidectomy  ~ 1980  . Skin cancer excision      'above left eyebrow; tip of my nose" (08/29/2012)  . Atrial flutter ablation  01/2011    "  didn't work" (08/29/2012)  . Coronary artery bypass graft  08/27/1989    CABG X 5  . Coronary artery bypass graft  06/2005    CABG X 3   Family History  Problem Relation Age of Onset  . Stroke Mother   . Heart attack Father     died @ 49, mult MI's.  [name] Kitchen Heart disease Father   . Hypertension Father   . Diabetes Sister    History  Substance Use Topics  . Smoking status: Former Smoker -- 0.50 packs/day for 10 years    Types: Cigarettes    Quit date: 09/21/1973  . Smokeless tobacco: Never Used  . Alcohol Use: No    Review of Systems  Constitutional:       Per HPI, otherwise negative  HENT:       Per HPI, otherwise negative  Respiratory:       Per HPI, otherwise negative  Cardiovascular:       Per HPI, otherwise negative  Gastrointestinal: Negative for vomiting.  Endocrine:       Negative aside from HPI    Genitourinary:       Neg aside from HPI   Musculoskeletal:       Per HPI, otherwise negative  Skin: Negative.   Neurological: Positive for headaches. Negative for syncope and weakness.      Allergies  Sulfa drugs cross reactors; Ultram; Xarelto; and Zithromax  Home Medications   Prior to Admission medications   Medication Sig Start Date End Date Taking? Authorizing Provider  acetaminophen (TYLENOL) 500 MG tablet Take 500 mg by mouth every 8 (eight) hours as needed.    Historical Provider, MD  ALPRAZolam Duanne Moron) 1 MG tablet Take 1 tablet (1 mg total) by mouth 3 (three) times daily as needed. For anxiety 01/16/14   Darlin Coco, MD  atorvastatin (LIPITOR) 40 MG tablet TAKE 1 BY MOUTH ALTERNATING WITH 1/2   TABLET EVERY OTHER DAY AS DIRECTED Patient taking differently: Take 20-40 mg by mouth every other day. TAKE 1 BY MOUTH ALTERNATING WITH 1/2   TABLET EVERY OTHER DAY AS DIRECTED 04/08/14   Darlin Coco, MD  carvedilol (COREG) 6.25 MG tablet Take 1 tablet (6.25 mg total) by mouth 2 (two) times daily. 12/25/13   Darlin Coco, MD  DimenhyDRINATE (DRAMAMINE PO) Take by mouth as needed.    Historical Provider, MD  doxepin (SINEQUAN) 25 MG capsule 1 capsule every morning and 2 capsules at night 01/16/14   Darlin Coco, MD  fluticasone [facility]) 50 MCG/ACT nasal spray Place 2 sprays into both nostrils daily. 09/11/14   Tiffany A Gann, PA-C  furosemide (LASIX) 20 MG tablet One tablet by mouth daily as needed for swelling 08/22/14   Darlin Coco, MD  gabapentin (NEURONTIN) 100 MG capsule TAKE 1 BY MOUTH 4 TIMES DAILY 04/07/14   Darlin Coco, MD  hydroxypropyl methylcellulose (ISOPTO TEARS) 2.5 % ophthalmic solution Place 1 drop into both eyes 4 (four) times daily as needed. Dry eyes    Historical Provider, MD  isosorbide mononitrate (IMDUR) 60 MG 24 hr tablet TAKE 1 BY MOUTH DAILY 04/04/14   Darlin Coco, MD  lisinopril (PRINIVIL,ZESTRIL) 10 MG tablet Take 10 mg by mouth as  directed. 1/2 tablet in the morning and 1 at bedtime    Historical Provider, MD  loratadine (CLARITIN) 10 MG tablet Take 10 mg by mouth daily.    Historical Provider, MD  mometasone (NASONEX) 50 MCG/ACT nasal spray Place 2 sprays into the nose daily. 09/11/14  09/18/14  Carmin Muskrat, MD  Multiple Vitamin (MULTIVITAMIN) tablet Take 1 tablet by mouth daily.      Historical Provider, MD  nitroGLYCERIN (NITROSTAT) 0.4 MG SL tablet Place 1 tablet (0.4 mg total) under the tongue every 5 (five) minutes as needed. For chest pain 07/05/13   Darlin Coco, MD  omeprazole (PRILOSEC) 20 MG capsule Take 1 capsule (20 mg total) by mouth 2 (two) times daily. 03/25/13   Darlin Coco, MD  polyethylene glycol [facility] / GLYCOLAX) packet Take 17 g by mouth daily.     Historical Provider, MD  warfarin (COUMADIN) 5 MG tablet Take 1 tablet (5 mg total) by mouth at bedtime. 08/18/14   Chipper Herb, MD   BP 134/83 mmHg  Pulse 60  Temp(Src) 98 F (36.7 C) (Oral)  Resp 18  Ht 5\' 11"  (1.803 m)  Wt 185 lb (83.915 kg)  BMI 25.81 kg/m2  SpO2 97% Physical Exam  Constitutional: He is oriented to person, place, and time. He appears well-developed. No distress.  HENT:  Head: Normocephalic and atraumatic.    Eyes: Conjunctivae and EOM are normal.  Neck: No spinous process tenderness and no muscular tenderness present. No rigidity. No edema, no erythema and normal range of motion present. No thyroid mass present.  Cardiovascular: Normal rate and regular rhythm.   Pulmonary/Chest: Effort normal. No stridor. No respiratory distress.  Abdominal: He exhibits no distension.  Musculoskeletal: He exhibits no edema.  Neurological: He is alert and oriented to person, place, and time. He displays no atrophy and no tremor. No cranial nerve deficit or sensory deficit. He exhibits normal muscle tone. He displays no seizure activity. Coordination normal.  Skin: Skin is warm and dry.  Psychiatric: He has a normal mood and  affect.  Nursing note and vitals reviewed.   ED Course  Procedures (including critical care time) Labs Review Labs Reviewed  PROTIME-INR - Abnormal; Notable for the following:    Prothrombin Time 19.9 (*)    INR 1.67 (*)    All other components within normal limits    Imaging Review Ct Head Wo Contrast  09/11/2014   CLINICAL DATA:  [address] and struck his head 3 days ago, continued head pain/headache today, on blood thinners  EXAM: CT HEAD WITHOUT CONTRAST  TECHNIQUE: Contiguous axial images were obtained from the base of the skull through the vertex without intravenous contrast.  COMPARISON:  07/11/2014  FINDINGS: Normal ventricular morphology.  No midline shift or mass effect.  Normal appearance of brain parenchyma.  No intracranial hemorrhage, mass lesion, or evidence acute infarction.  No extra-axial fluid collections.  Sinuses and mastoid air cells clear.  Skull intact.  IMPRESSION: No acute intracranial abnormalities.   Electronically Signed   By: Lavonia Dana M.D.   On: 09/11/2014 14:56     MDM   Final diagnoses:  Bad headache    Patient presents with ongoing headache following a fall that occurred several days ago. Notably, the patient also has ongoing sinus congestion, and headache is likely multifactorial. No evidence for neurologic deficit, head CT performed, per primary care request, and given the patient's history of Coumadin, with persistent headache following a fall, was unremarkable. Patient received additional medication for therapy, was discharged stable condition with return precautions, follow-up instructions.    Carmin Muskrat, MD 09/11/14 [phone]

## 2014-09-11 NOTE — Progress Notes (Signed)
   Subjective:    Patient ID: [name], male    DOB: 08-13-1940, 74 y.o.   MRN: [id number]  HPI 74 y/o male taking coumadin presents with c/o headache ( generalized over entire head and behind eyes) since he fell on Monday. Hits his right temporal region on the side of the recliner. He saw Dr. Livia Snellen on Tuesday and was advised to rtc if his HA continued. Has tried Tylenol 500 mg TID for HA with some relief.     Review of Systems  Constitutional: Negative.   HENT: Negative for congestion, ear discharge, ear pain, postnasal drip and rhinorrhea.        Pressure behind bilateral eyes since fall Monday   Eyes: Positive for visual disturbance (possibly a little blurry ).  Respiratory: Negative.   Cardiovascular: Negative.   Gastrointestinal: Negative.   Musculoskeletal:       Scalp is ttp on left temporal lobe   Neurological: Positive for headaches (generalized over entire head). Negative for dizziness, syncope, weakness, light-headedness and numbness.  Psychiatric/Behavioral: Negative.        Objective:   Physical Exam  Constitutional: He appears well-developed and well-nourished. No distress.  HENT:  Head: Normocephalic.  Right Ear: External ear normal.  Left Ear: External ear normal.  Negative for discharge from bilateral ears Scalp in tender to mild palpation on posterior neck and left temporal lobe of scalp  Pulmonary/Chest: Effort normal.  Neurological: He is alert.  Skin: He is not diaphoretic.  Nursing note and vitals reviewed.         Assessment & Plan:  1. Acute post-traumatic headache, not intractable  - CT Head Wo Contrast at [facility] - Will report results immediately and treat as needed  2. Other allergic rhinitis - Flonase as directed - Continue claritin and saline nasal spray        [name] A. Benjamin Stain PA-C

## 2014-09-12 ENCOUNTER — Telehealth: Payer: Self-pay | Admitting: Cardiology

## 2014-09-12 NOTE — Telephone Encounter (Signed)
New message     Can pt take flonase?

## 2014-09-12 NOTE — Telephone Encounter (Signed)
Advised patient ok to take

## 2014-09-16 ENCOUNTER — Encounter: Payer: Self-pay | Admitting: Family Medicine

## 2014-09-16 ENCOUNTER — Ambulatory Visit (INDEPENDENT_AMBULATORY_CARE_PROVIDER_SITE_OTHER): Payer: Medicare Other | Admitting: Pharmacist Clinician (PhC)/ Clinical Pharmacy Specialist

## 2014-09-16 DIAGNOSIS — I482 Chronic atrial fibrillation, unspecified: Secondary | ICD-10-CM

## 2014-09-16 LAB — POCT INR: INR: 1.9

## 2014-09-17 ENCOUNTER — Ambulatory Visit (INDEPENDENT_AMBULATORY_CARE_PROVIDER_SITE_OTHER): Payer: Medicare Other | Admitting: Physician Assistant

## 2014-09-17 ENCOUNTER — Encounter: Payer: Self-pay | Admitting: Physician Assistant

## 2014-09-17 VITALS — BP 136/75 | HR 63 | Temp 97.9°F | Ht 71.0 in | Wt 182.0 lb

## 2014-09-17 DIAGNOSIS — J3089 Other allergic rhinitis: Secondary | ICD-10-CM

## 2014-09-17 DIAGNOSIS — J0110 Acute frontal sinusitis, unspecified: Secondary | ICD-10-CM | POA: Diagnosis not present

## 2014-09-17 MED ORDER — FLUTICASONE PROPIONATE 50 MCG/ACT NA SUSP: 2.0000 | Freq: Every day | NASAL | Status: DC

## 2014-09-17 MED ORDER — AMOXICILLIN 875 MG PO TABS: 875.0000 mg | ORAL_TABLET | Freq: Two times a day (BID) | ORAL | Status: DC

## 2014-09-17 NOTE — Progress Notes (Signed)
   Subjective:    Patient ID: [name], male    DOB: December 10, 1940, 74 y.o.   MRN: [id number]  HPI 74 y/o male presents for f/u of headache s/p fall a week ago. He had a CT scan at [facility] at his visit on 09/11/14, which was negative. He states that he continues to have a headache behind his eyes with associated nasal congestion and "stuffiness"    Review of Systems  Constitutional: Negative.   HENT: Positive for congestion (nasal ), rhinorrhea and sinus pressure.   Eyes: Negative.   Respiratory: Negative.   Neurological: Positive for headaches (behind his eyes).       Objective:   Physical Exam  Constitutional: He is oriented to person, place, and time. He appears well-developed and well-nourished. No distress.  HENT:  Head: Normocephalic.  Right Ear: External ear normal.  Left Ear: External ear normal.  Mouth/Throat: Oropharynx is clear and moist.  TTP on bilateral frontal and mild TTP on bilateral maxillary sinuses  Nasal turbinates erythematous, boggy   Neurological: He is alert and oriented to person, place, and time.  Skin: He is not diaphoretic.  Psychiatric: He has a normal mood and affect. His behavior is normal. Judgment and thought content normal.  Very talkative during exam   Nursing note and vitals reviewed.         Assessment & Plan:  1. Other allergic rhinitis  - fluticasone (FLONASE) 50 MCG/ACT nasal spray; Place 2 sprays into both nostrils daily.  Dispense: 16 g; Refill: 6  2. Acute frontal sinusitis, recurrence not specified - OTC plain musinex Saline nasal spray  - amoxicillin (AMOXIL) 875 MG tablet; Take 1 tablet (875 mg total) by mouth 2 (two) times daily.  Dispense: 20 tablet; Refill: 0 - fluticasone (FLONASE) 50 MCG/ACT nasal spray; Place 2 sprays into both nostrils daily.  Dispense: 16 g; Refill: 6     Victory Strollo A. Benjamin Stain PA-C

## 2014-09-17 NOTE — Patient Instructions (Addendum)
Over the counter plain musinex with plenty of fluid     Sinusitis Sinusitis is redness, soreness, and puffiness (inflammation) of the air pockets in the bones of your face (sinuses). The redness, soreness, and puffiness can cause air and mucus to get trapped in your sinuses. This can allow germs to grow and cause an infection.  HOME CARE   Drink enough fluids to keep your pee (urine) clear or pale yellow.  Use a humidifier in your home.  Run a hot shower to create steam in the bathroom. Sit in the bathroom with the door closed. Breathe in the steam 3-4 times a day.  Put a warm, moist washcloth on your face 3-4 times a day, or as told by your doctor.  Use salt water sprays (saline sprays) to wet the thick fluid in your nose. This can help the sinuses drain.  Only take medicine as told by your doctor. GET HELP RIGHT AWAY IF:   Your pain gets worse.  You have very bad headaches.  You are sick to your stomach (nauseous).  You throw up (vomit).  You are very sleepy (drowsy) all the time.  Your face is puffy (swollen).  Your vision changes.  You have a stiff neck.  You have trouble breathing. MAKE SURE YOU:   Understand these instructions.  Will watch your condition.  Will get help right away if you are not doing well or get worse. Document Released: 10/05/2007 Document Revised: 01/11/2012 Document Reviewed: 11/22/2011 [facility] Patient Information 2015 [place], [place]. This information is not intended to replace advice given to you by your health care provider. Make sure you discuss any questions you have with your health care provider.

## 2014-09-18 ENCOUNTER — Telehealth: Payer: Self-pay | Admitting: Physician Assistant

## 2014-09-18 NOTE — Telephone Encounter (Signed)
He does not need xray. Had a CT of head last week. Taking flonase for symptoms will be fine   Thi Klich A. Benjamin Stain PA-C

## 2014-09-18 NOTE — Telephone Encounter (Signed)
Pt called to inform Amoxicillin caused GI upset and he d/ced med He wants to try using Flonase for symptoms He will call back if symptoms persist

## 2014-09-19 ENCOUNTER — Telehealth: Payer: Self-pay | Admitting: Cardiology

## 2014-09-19 ENCOUNTER — Telehealth: Payer: Self-pay | Admitting: Physician Assistant

## 2014-09-19 NOTE — Telephone Encounter (Signed)
New message      Pt calling stating stating that his PCP prescribed him Amoxicillin 875 mg and he wants to find out if it is ok for him to take it. Please call back and advise.

## 2014-09-19 NOTE — Telephone Encounter (Signed)
Discussed with  Dr. Mare Ferrari and ok to take  Advised patient

## 2014-09-19 NOTE — Telephone Encounter (Signed)
Stp and he states the antibiotic was giving him diarrhea, explained to pt this is normal as the antibiotic will also kill the normal flora in the intestinal tract which causes the diarrhea and the stomach cramping, pt states he took Immodium yesterday and it helped. Advised pt to continue with the Immodium and if it helps he will be fine. If the diarrhea persists or worsens to CB. Pt voiced understanding.

## 2014-09-22 ENCOUNTER — Other Ambulatory Visit: Payer: Self-pay | Admitting: Physician Assistant

## 2014-09-22 ENCOUNTER — Telehealth: Payer: Self-pay | Admitting: Physician Assistant

## 2014-09-22 MED ORDER — NYSTATIN 100000 UNIT/ML MT SUSP: 5.0000 mL | Freq: Four times a day (QID) | OROMUCOSAL | Status: DC

## 2014-09-22 NOTE — Telephone Encounter (Signed)
Pt notified of rx Verbalizes understanding

## 2014-09-22 NOTE — Telephone Encounter (Signed)
Prescription sent to pharmacy  For Nystatin until finished.   Sherl Yzaguirre A. Benjamin Stain PA-C

## 2014-09-23 ENCOUNTER — Telehealth: Payer: Self-pay | Admitting: *Deleted

## 2014-09-23 NOTE — Telephone Encounter (Signed)
Patient called stating that he thought he was having an allergic reaction to the antibiotic. He states that he was given magic mouthwash yesterday. Advised patient to stop ABT and continue with mouth wash. Wants to know if different abt can be called in. Please advise and route to [facility]

## 2014-09-24 NOTE — Telephone Encounter (Signed)
Advise patient to stop antibiotic. Try to use Flonase only for sinus issues. Continue with mouthwash.   Antwanette Wesche A. Benjamin Stain PA-C

## 2014-09-25 ENCOUNTER — Other Ambulatory Visit: Payer: Self-pay | Admitting: Physician Assistant

## 2014-09-25 MED ORDER — NYSTATIN 100000 UNIT/ML MT SUSP: OROMUCOSAL | Status: DC

## 2014-09-25 NOTE — Telephone Encounter (Signed)
Prescription sent to pharmacy.

## 2014-09-25 NOTE — Telephone Encounter (Signed)
Patient aware.

## 2014-09-30 ENCOUNTER — Other Ambulatory Visit: Payer: Self-pay | Admitting: Cardiology

## 2014-10-02 ENCOUNTER — Encounter: Payer: Self-pay | Admitting: Physician Assistant

## 2014-10-02 ENCOUNTER — Ambulatory Visit (INDEPENDENT_AMBULATORY_CARE_PROVIDER_SITE_OTHER): Payer: Medicare Other | Admitting: Physician Assistant

## 2014-10-02 ENCOUNTER — Ambulatory Visit: Payer: Medicare Other | Admitting: Physician Assistant

## 2014-10-02 VITALS — BP 108/67 | HR 76 | Temp 97.3°F | Ht 71.0 in | Wt 182.0 lb

## 2014-10-02 DIAGNOSIS — J0120 Acute ethmoidal sinusitis, unspecified: Secondary | ICD-10-CM | POA: Diagnosis not present

## 2014-10-02 MED ORDER — AZITHROMYCIN 250 MG PO TABS: ORAL_TABLET | ORAL | Status: DC

## 2014-10-02 NOTE — Progress Notes (Signed)
   Subjective:    Patient ID: [name], male    DOB: 26-Aug-1940, 74 y.o.   MRN: [id number]  HPI 74 y/o male presetns with c/o burning of tongue after taking Amoxicillin for sinus infection. He has used Nystatin mouthwash 5 times daily x 10 days with no relief.   He also has had a headache behind his eyes but had to stop taking the Amoxicillin for sinusitis d/t the yeast on his tongue.     Review of Systems  HENT:       States "whiteness", burning on his tongue  Neurological: Positive for headaches (behind his eyes).       Objective:   Physical Exam  Constitutional: He is oriented to person, place, and time. He appears well-developed and well-nourished. No distress.  HENT:  Head: Normocephalic and atraumatic.  Mouth/Throat: Oropharynx is clear and moist.  ttp over bilateral maxillary sinuses No whitish discoloration on tongue indicating thrush Negative for erythema or tenderness  Neurological: He is alert and oriented to person, place, and time.  Skin: He is not diaphoretic.  Psychiatric: He has a normal mood and affect. His behavior is normal. Judgment and thought content normal.  Nursing note and vitals reviewed.         Assessment & Plan:  1. Acute ethmoidal sinusitis, recurrence not specified  - azithromycin (ZITHROMAX) 250 MG tablet; 2 tablets on day 1, 1 tablet on day 2-5  Dispense: 6 tablet; Refill: 0 - Patient states that he has taken this medication numerous times while taking Warfarin and Doxepin with no problems. He insists on trying this antibiotic and possible complications were discussed in depth with him. Due to his SE of itching with the azithromycin, I ha\ve advised him to take Benadryl as directed to prevent itch.   - Stop taking flonase in case this is causing his tongue to burn - Stop using Nystatin mouthwash      Brett Vanduyn A. Benjamin Stain PA-C

## 2014-10-02 NOTE — Patient Instructions (Signed)
Take Benadryl as directed with antibiotic to prevent itching.

## 2014-10-03 ENCOUNTER — Telehealth: Payer: Self-pay | Admitting: Physician Assistant

## 2014-10-04 NOTE — Telephone Encounter (Signed)
Yes, Take the benadryl as directed every 4-6 hours.

## 2014-10-04 NOTE — Telephone Encounter (Signed)
Pt notifed of Tiffany's recommendation Verbalizes understanding

## 2014-10-08 ENCOUNTER — Encounter: Payer: Self-pay | Admitting: Cardiology

## 2014-10-08 ENCOUNTER — Ambulatory Visit (INDEPENDENT_AMBULATORY_CARE_PROVIDER_SITE_OTHER): Payer: Medicare Other | Admitting: Cardiology

## 2014-10-08 ENCOUNTER — Other Ambulatory Visit (INDEPENDENT_AMBULATORY_CARE_PROVIDER_SITE_OTHER): Payer: Medicare Other | Admitting: *Deleted

## 2014-10-08 VITALS — BP 100/50 | HR 61 | Ht 71.0 in | Wt 179.4 lb

## 2014-10-08 DIAGNOSIS — I481 Persistent atrial fibrillation: Secondary | ICD-10-CM

## 2014-10-08 DIAGNOSIS — I10 Essential (primary) hypertension: Secondary | ICD-10-CM

## 2014-10-08 DIAGNOSIS — I4819 Other persistent atrial fibrillation: Secondary | ICD-10-CM

## 2014-10-08 LAB — CBC WITH DIFFERENTIAL/PLATELET
BASOS PCT: 0.5 % (ref 0.0–3.0)
Basophils Absolute: 0 10*3/uL (ref 0.0–0.1)
EOS ABS: 0.1 10*3/uL (ref 0.0–0.7)
EOS PCT: 2.3 % (ref 0.0–5.0)
HCT: 42.2 % (ref 39.0–52.0)
Hemoglobin: 13.8 g/dL (ref 13.0–17.0)
LYMPHS PCT: 37.3 % (ref 12.0–46.0)
Lymphs Abs: 1.8 10*3/uL (ref 0.7–4.0)
MCHC: 32.7 g/dL (ref 30.0–36.0)
MCV: 94.2 fl (ref 78.0–100.0)
MONOS PCT: 7.1 % (ref 3.0–12.0)
Monocytes Absolute: 0.3 10*3/uL (ref 0.1–1.0)
NEUTROS ABS: 2.5 10*3/uL (ref 1.4–7.7)
Neutrophils Relative %: 52.8 % (ref 43.0–77.0)
Platelets: 146 10*3/uL — ABNORMAL LOW (ref 150.0–400.0)
RBC: 4.48 Mil/uL (ref 4.22–5.81)
RDW: 14 % (ref 11.5–15.5)
WBC: 4.8 10*3/uL (ref 4.0–10.5)

## 2014-10-08 LAB — LIPID PANEL
Cholesterol: 104 mg/dL (ref 0–200)
HDL: 39.4 mg/dL (ref >39.00)
LDL Cholesterol: 51 mg/dL (ref 0–99)
NonHDL: 64.6
Total CHOL/HDL Ratio: 3
Triglycerides: 70 mg/dL (ref 0.0–149.0)
VLDL: 14 mg/dL (ref 0.0–40.0)

## 2014-10-08 LAB — HEPATIC FUNCTION PANEL
ALT: 18 U/L (ref 0–53)
AST: 20 U/L (ref 0–37)
Albumin: 3.8 g/dL (ref 3.5–5.2)
Alkaline Phosphatase: 69 U/L (ref 39–117)
Bilirubin, Direct: 0.3 mg/dL (ref 0.0–0.3)
TOTAL PROTEIN: 6 g/dL (ref 6.0–8.3)
Total Bilirubin: 1 mg/dL (ref 0.2–1.2)

## 2014-10-08 LAB — BASIC METABOLIC PANEL
BUN: 10 mg/dL (ref 6–23)
CO2: 32 mEq/L (ref 19–32)
Calcium: 8.7 mg/dL (ref 8.4–10.5)
Chloride: 95 mEq/L — ABNORMAL LOW (ref 96–112)
Creatinine, Ser: 1 mg/dL (ref 0.40–1.50)
GFR: 77.61 mL/min (ref >60.00)
GLUCOSE: 83 mg/dL (ref 70–99)
POTASSIUM: 4.2 meq/L (ref 3.5–5.1)
SODIUM: 128 meq/L — AB (ref 135–145)

## 2014-10-08 NOTE — Progress Notes (Signed)
Cardiology Office Note   Date:  10/08/2014   ID:  [name], DOB 01/17/1941, MRN [id number]  PCP:  Marline Backbone, PA-C  Cardiologist: Darlin Coco MD  No chief complaint on file.     History of Present Illness: [name] is a 74 y.o. male who presents for scheduled 3 month follow-up office visit  This pleasant 74 year old gentleman is seen for a scheduled followup office visit. He has a history of significant ischemic heart disease. He has a history of a remote CABG and had redo CABG in 2007. He was admitted to [facility] on 03/09/12 and discharged on 03/11/12. He was admitted after he complained of having some chest discomfort at the time of a routine pacemaker check up. He was found to be in atrial fibrillation at the time of admission. His most recent Lexiscan Myoview was 06/12/14 which showed an ejection fraction of 26% with akinesis of the inferior and inferolateral walls but no ischemia. His last echocardiogram in August 2014 showed an ejection fraction of 25-30%. 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 His last cardiac catheterization in October 2012 showed that his grafts were patent. The patient also has a history of hypercholesterolemia and essential hypertension. He has had a prior history of paroxysmal atrial flutter with ablation therapy and subsequent heart block which required a [employer] pacemaker which was implanted in 2012. The patient is on long-term Coumadin. He has a lot of anxiety. On 05/25/12 the patient underwent elective cardioversion which was successful. Normal sinus rhythm and was returned as was confirmed by intracardiac electrogram via his pacemaker. However, within 24 hours after the cardioversion he felt that his pulse was irregular again. EKG confirmed that he was back in atrial flutter fibrillation. He was essentially asymptomatic and states that the arrhythmia doesn't really bother him much. Strategy is rate  control and long-term anticoagulation. He is intolerant of Xarelto because of itching and is back on long-term Coumadin. The patient was readmitted to the hospital in November 2014 for chest pain. He underwent another nuclear stress test on 03/27/13. This showed an ejection fraction of 33% and showed an inferior wall scar but no reversible ischemia. His most recent echocardiogram on 12/13/13 showed an ejection fraction of 30% The patient reports that he has not had to take any sublingual nitroglycerin since his last office visit with [place]. He has seen Dr. Gae Gallop regarding questionable leg claudication. Dr. Scot Dock did not feel that he required any further intervention. He was encouraged to continue to walk. Previous abdominal CT scan in April 2015 showed extensive atherosclerotic involvement of the aorta and iliac branches. He comes in today for a follow-up appointment. .  He has a past history of dyspepsia and is followed by gastroenterology at [facility]. Since last visit he has had a lot of sinus and allergy symptoms.  He has had 2 rounds of treatment for sinusitis, with Augmentin and with a trial of erythromycin.  He states he is allergic to erythromycin.  His PCP has referred him to Dr. Simeon Craft, ENT physician, for tomorrow.  Past Medical History  Diagnosis Date  . Ischemic cardiomyopathy     a. 06/2012 EF 25% by echo.  . Hypertension   . Hyperlipidemia   . GERD (gastroesophageal reflux disease)   . CAD (coronary artery disease)     a. 1991 s/p CABGx4;  b. redo CABG in 2007 (SVG->OM, VG->PDA->PLV);  c. 01/2011 Cath: LM  80ost, LAD 100p, LCX 100ost, OM1 100, RCA 100, LIMA->LAD ok, VG->PDA ok, VG->OM ok;  d. 03/2012 MV: EF 32% inf & inflat scar w/o ischemia; e. Lexi MV: EF 33%, inf infarct, no ischemia. f. lexiscan 06/12/2014 RCA scar, no ischemia  . Atrial flutter     a. 01/2011 s/p unsuccessful RFCA complicated by CHB req PPM.  . CHB (complete heart block)     a. 01/2011: in setting of  RFCA, s/p SJM 2110 [employer] DC PPM, ser # [id number].  [name] Kitchen Kidney cysts     a. bilateral  . Hiatal hernia     a. 03/01/2013 EGD and esoph dil.  [name] Kitchen BPH (benign prostatic hyperplasia)     a. elevated PSA  . A-fib     a. chronic coumadin  . Elevated PSA, less than 10 ng/ml   . Fibromyalgia   . Skin cancer     'above left eyebrow; tip of my nose" (08/29/2012)  . Depression     "since 1986-1987" (08/29/2012)  . Anxiety   . Panic attacks   . Chronic systolic CHF (congestive heart failure), NYHA class 2     a. 06/2012 Echo: EF 25%, distal sept and apical AK, sev dil LV, mild LVH, mod dil LA.  [name] Kitchen Pacemaker     Past Surgical History  Procedure Laterality Date  . Cardiac catheterization  10/30/2009    Grafts patent. Mild to moderate LV dysfunction. EF 45%. Managed medically.   . Cardiac catheterization      multiple  . Tonsillectomy  1960  . Cholecystectomy  2007  . Insert / replace / remove pacemaker  01/2011    initial placement  . Inguinal hernia repair  1960's    "left I think"  . Cataract extraction w/ intraocular lens  implant, bilateral  2012  . Cardioversion  05/25/2012    Procedure: CARDIOVERSION;  Surgeon: Darlin Coco, MD;  Location[facility] ENDOSCOPY;  Service: Cardiovascular;  Laterality: N/A;  . Humerus surgery Left 1960    "growth on the bone taken off; not cancer" (08/29/2012)  . Excisional hemorrhoidectomy  ~ 1980  . Skin cancer excision      'above left eyebrow; tip of my nose" (08/29/2012)  . Atrial flutter ablation  01/2011    "didn't work" (08/29/2012)  . Coronary artery bypass graft  08/27/1989    CABG X 5  . Coronary artery bypass graft  06/2005    CABG X 3     Current Outpatient Prescriptions  Medication Sig Dispense Refill  . acetaminophen (TYLENOL) 500 MG tablet Take 500 mg by mouth every 8 (eight) hours as needed.    . ALPRAZolam (XANAX) 1 MG tablet Take 1 tablet (1 mg total) by mouth 3 (three) times daily as needed. For anxiety 270 tablet 1  . atorvastatin (LIPITOR)  40 MG tablet TAKE 1 BY MOUTH ALTERNATING WITH 1/2   TABLET EVERY OTHER DAY AS DIRECTED (Patient taking differently: Take 20-40 mg by mouth every other day. TAKE 1 BY MOUTH ALTERNATING WITH 1/2   TABLET EVERY OTHER DAY AS DIRECTED) 135 tablet 1  . carvedilol (COREG) 6.25 MG tablet TAKE 1 BY MOUTH TWICE DAILY 180 tablet 0  . doxepin (SINEQUAN) 25 MG capsule 1 capsule every morning and 2 capsules at night 270 capsule 3  . fluticasone (FLONASE) 50 MCG/ACT nasal spray Place 2 sprays into both nostrils daily. 16 g 6  . furosemide (LASIX) 20 MG tablet One tablet by mouth daily as needed for swelling 30 tablet  5  . gabapentin (NEURONTIN) 100 MG capsule TAKE 1 BY MOUTH 4 TIMES DAILY 360 capsule 2  . hydroxypropyl methylcellulose (ISOPTO TEARS) 2.5 % ophthalmic solution Place 1 drop into both eyes 4 (four) times daily as needed. Dry eyes    . isosorbide mononitrate (IMDUR) 60 MG 24 hr tablet TAKE 1 BY MOUTH DAILY 90 tablet 1  . lisinopril (PRINIVIL,ZESTRIL) 10 MG tablet Take 10 mg by mouth as directed. 1/2 tablet in the morning and 1 at bedtime    . Multiple Vitamin (MULTIVITAMIN) tablet Take 1 tablet by mouth daily.      . nitroGLYCERIN (NITROSTAT) 0.4 MG SL tablet Place 1 tablet (0.4 mg total) under the tongue every 5 (five) minutes as needed. For chest pain 100 tablet prn  . nystatin (MYCOSTATIN) 100000 UNIT/ML suspension Swish with 78mL 4 times daily 120 mL 0  . omeprazole (PRILOSEC) 20 MG capsule Take 1 capsule (20 mg total) by mouth 2 (two) times daily. 180 capsule 3  . polyethylene glycol (MIRALAX / GLYCOLAX) packet Take 17 g by mouth daily.     [name] Kitchen warfarin (COUMADIN) 5 MG tablet Take 1 tablet (5 mg total) by mouth at bedtime. 90 tablet 0   No current facility-administered medications for this visit.    Allergies:   Sulfa drugs cross reactors; Ultram; and Xarelto    Social History:  The patient  reports that he quit smoking about 41 years ago. His smoking use included Cigarettes. He has a 5  pack-year smoking history. He has never used smokeless tobacco. He reports that he does not drink alcohol or use illicit drugs.   Family History:  The patient's family history includes Diabetes in his sister; Heart attack in his father; Heart disease in his father; Hypertension in his father; Stroke in his mother.    ROS:  Please see the history of present illness.   Otherwise, review of systems are positive for none.   All other systems are reviewed and negative.    PHYSICAL EXAM: VS:  BP 100/50 mmHg  Pulse 61  Ht 5\' 11"  (1.803 m)  Wt 179 lb 6.4 oz (81.375 kg)  BMI 25.03 kg/m2 , BMI Body mass index is 25.03 kg/(m^2). GEN: Well nourished, well developed, in no acute distress HEENT: normal Neck: no JVD, carotid bruits, or masses Cardiac: Regular paced rhythm. no murmurs, rubs, or gallops,no edema  Respiratory:  clear to auscultation bilaterally, normal work of breathing GI: soft, nontender, nondistended, + BS.  There is a softball-sized epigastric hernia which is soft and nontender MS: no deformity or atrophy Skin: warm and dry, no rash Neuro:  Strength and sensation are intact Psych: euthymic mood, full affect   EKG:  EKG is not ordered today.    Recent Labs: 01/10/2014: ALT 20 02/15/2014: Pro B Natriuretic peptide (BNP) 788.4* 06/11/2014: Hemoglobin 13.4; Platelets 155 06/12/2014: BUN 15; Creatinine, Ser 1.03; Potassium 5.2*; Sodium 133*    Lipid Panel    Component Value Date/Time   CHOL 115 01/10/2014 1226   TRIG 65.0 01/10/2014 1226   HDL 40.80 01/10/2014 1226   CHOLHDL 3 01/10/2014 1226   VLDL 13.0 01/10/2014 1226   LDLCALC 61 01/10/2014 1226   LDLDIRECT 77.3 12/20/2010 0922      Wt Readings from Last 3 Encounters:  10/08/14 179 lb 6.4 oz (81.375 kg)  10/02/14 182 lb (82.555 kg)  09/17/14 182 lb (82.555 kg)         ASSESSMENT AND PLAN:  1. Ischemic heart disease status  post CABG remotely and status post redo CABG in 2007. 2. Hyperlipidemia 3.  hypertensive heart disease without heart failure 4. Anxiety 5. permanent atrial fibrillation with functioning ventricular pacemaker. On Coumadin. 6. Dyspnea. Most recent ejection fraction by echo on 12/13/13 is 30%, improved from 38% 7. systolic heart murmur at aortic area. Echo shows aortic valve sclerosis and mild mitral regurgitation 8. PAD. Recently seen by Dr. Gae Gallop. No surgery indicated at this point. 9. Possible chronic sinusitis versus allergic sinusitis.  To see ENT tomorrow   Current medicines are reviewed at length with the patient today.  The patient does not have concerns regarding medicines.  The following changes have been made:  no change  Labs/ tests ordered today include:   Orders Placed This Encounter  Procedures  . Lipid panel  . Basic metabolic panel  . Hepatic function panel     Disposition: Continue current medication.  Recheck in 3 months for office visit EKG lipid panel hepatic function panel and nasal metabolic panel.  Continue to walk in the house for exercise   Signed, Darlin Coco MD 10/08/2014 8:26 AM    [address], [place], [place]  [address] Phone: [phone]; Fax: [phone]

## 2014-10-08 NOTE — Patient Instructions (Signed)
Medication Instructions:  Your physician recommends that you continue on your current medications as directed. Please refer to the Current Medication list given to you today.  Labwork: none  Testing/Procedures: none  Follow-Up: Your physician recommends that you schedule a follow-up appointment in: 3 months with fasting labs (LP/BMET/HFP)

## 2014-10-09 ENCOUNTER — Ambulatory Visit
Admission: RE | Admit: 2014-10-09 | Discharge: 2014-10-09 | Disposition: A | Discharge status: Home / Self Care | Payer: Medicare Other | Source: Ambulatory Visit | Attending: Otolaryngology | Admitting: Otolaryngology

## 2014-10-09 ENCOUNTER — Other Ambulatory Visit: Payer: Self-pay | Admitting: Otolaryngology

## 2014-10-09 ENCOUNTER — Telehealth: Payer: Self-pay | Admitting: Cardiology

## 2014-10-09 ENCOUNTER — Encounter: Payer: Medicare Other | Admitting: *Deleted

## 2014-10-09 ENCOUNTER — Telehealth: Payer: Self-pay | Admitting: *Deleted

## 2014-10-09 DIAGNOSIS — J018 Other acute sinusitis: Secondary | ICD-10-CM

## 2014-10-09 DIAGNOSIS — R519 Headache, unspecified: Secondary | ICD-10-CM

## 2014-10-09 DIAGNOSIS — F419 Anxiety disorder, unspecified: Secondary | ICD-10-CM

## 2014-10-09 DIAGNOSIS — R51 Headache: Secondary | ICD-10-CM

## 2014-10-09 MED ORDER — LISINOPRIL 5 MG PO TABS: ORAL_TABLET | ORAL | Status: DC

## 2014-10-09 MED ORDER — CARVEDILOL 6.25 MG PO TABS: ORAL_TABLET | ORAL | Status: DC

## 2014-10-09 MED ORDER — ISOSORBIDE MONONITRATE ER 60 MG PO TB24: ORAL_TABLET | ORAL | Status: DC

## 2014-10-09 MED ORDER — ATORVASTATIN CALCIUM 40 MG PO TABS: 20.0000 mg | ORAL_TABLET | ORAL | Status: DC

## 2014-10-09 MED ORDER — ALPRAZOLAM 1 MG PO TABS: 1.0000 mg | ORAL_TABLET | Freq: Three times a day (TID) | ORAL | Status: DC | PRN

## 2014-10-09 NOTE — Telephone Encounter (Signed)
Patient aware of labs and decrease Lisinopril to 5 mg every other day    Notes Recorded by Darlin Coco, MD on 10/08/2014 at 7:07 PM Please report. Labs okay except for low serum sodium. His BP today was also lower than it needs to be. Reduce lisinopril to just one half tablet twice a day instead of taking a half in the morning and a whole tablet at night.

## 2014-10-09 NOTE — Telephone Encounter (Signed)
LMOVM reminding pt to send remote transmission.   

## 2014-10-09 NOTE — Telephone Encounter (Signed)
-----   Message from Darlin Coco, MD sent at 10/09/2014  5:14 PM EDT ----- Reduce lisinopril 5 mg to just every other day ----- Message -----    From: Earvin Hansen    Sent: 10/09/2014   3:27 PM      To: Darlin Coco, MD  Spoke with patient and he is actually only taking Lisinopril 5 mg at night Will forward to  Dr. Mare Ferrari for review

## 2014-10-10 ENCOUNTER — Encounter: Payer: Self-pay | Admitting: Cardiology

## 2014-10-10 ENCOUNTER — Ambulatory Visit (INDEPENDENT_AMBULATORY_CARE_PROVIDER_SITE_OTHER): Payer: Medicare Other | Admitting: *Deleted

## 2014-10-10 DIAGNOSIS — I442 Atrioventricular block, complete: Secondary | ICD-10-CM

## 2014-10-13 NOTE — Progress Notes (Signed)
Remote pacemaker transmission.   

## 2014-10-19 LAB — CUP PACEART REMOTE DEVICE CHECK
Battery Remaining Longevity: 110 mo
Battery Voltage: 2.96 V
Brady Statistic RV Percent Paced: 99 %
Date Time Interrogation Session: 20160611182636
Lead Channel Pacing Threshold Amplitude: 0.75 V
Lead Channel Pacing Threshold Pulse Width: 0.4 ms
Lead Channel Setting Pacing Amplitude: 2.5 V
Lead Channel Setting Sensing Sensitivity: 2 mV
MDC IDC MSMT BATTERY REMAINING PERCENTAGE: 82 %
MDC IDC MSMT LEADCHNL RV IMPEDANCE VALUE: 480 Ohm
MDC IDC MSMT LEADCHNL RV SENSING INTR AMPL: 12 mV
MDC IDC SET LEADCHNL RV PACING PULSEWIDTH: 0.4 ms
Pulse Gen Model: 2110
Pulse Gen Serial Number: 7282780

## 2014-10-20 ENCOUNTER — Other Ambulatory Visit: Payer: Self-pay

## 2014-10-20 MED ORDER — WARFARIN SODIUM 5 MG PO TABS: 5.0000 mg | ORAL_TABLET | Freq: Every day | ORAL | Status: DC

## 2014-10-21 ENCOUNTER — Ambulatory Visit (INDEPENDENT_AMBULATORY_CARE_PROVIDER_SITE_OTHER): Payer: Medicare Other | Admitting: Pharmacist Clinician (PhC)/ Clinical Pharmacy Specialist

## 2014-10-21 ENCOUNTER — Other Ambulatory Visit: Payer: Self-pay | Admitting: *Deleted

## 2014-10-21 DIAGNOSIS — I482 Chronic atrial fibrillation, unspecified: Secondary | ICD-10-CM

## 2014-10-21 LAB — POCT INR: INR: 3.4

## 2014-10-21 MED ORDER — WARFARIN SODIUM 5 MG PO TABS: 5.0000 mg | ORAL_TABLET | Freq: Every day | ORAL | Status: DC

## 2014-10-21 MED ORDER — NYSTATIN 100000 UNIT/ML MT SUSP: OROMUCOSAL | Status: DC

## 2014-10-21 NOTE — Addendum Note (Signed)
Addended by: Memory Argue on: 10/21/2014 08:18 AM   Modules accepted: Orders

## 2014-10-21 NOTE — Patient Instructions (Signed)
Anticoagulation Dose Instructions as of 10/21/2014      [name] Tue Wed Thu Fri Sat   New Dose 5 mg 5 mg 5 mg 5 mg 5 mg 5 mg 5 mg    Description        Skip today's dose and resume regular schedule tomorrow.  Patient getting different warfarin manufacturer from [place] today

## 2014-10-22 ENCOUNTER — Other Ambulatory Visit: Payer: Self-pay

## 2014-10-22 MED ORDER — WARFARIN SODIUM 5 MG PO TABS: 5.0000 mg | ORAL_TABLET | Freq: Every day | ORAL | Status: DC

## 2014-10-23 ENCOUNTER — Encounter: Payer: Self-pay | Admitting: Pharmacist

## 2014-10-23 ENCOUNTER — Ambulatory Visit (INDEPENDENT_AMBULATORY_CARE_PROVIDER_SITE_OTHER): Payer: Medicare Other | Admitting: Pharmacist

## 2014-10-23 DIAGNOSIS — I482 Chronic atrial fibrillation, unspecified: Secondary | ICD-10-CM

## 2014-10-23 LAB — POCT INR: INR: 2.2

## 2014-10-23 MED ORDER — WARFARIN SODIUM 5 MG PO TABS: 5.0000 mg | ORAL_TABLET | Freq: Every day | ORAL | Status: DC

## 2014-10-23 NOTE — Patient Instructions (Signed)
Anticoagulation Dose Instructions as of 10/23/2014      [name] Tue Wed Thu Fri Sat   New Dose 5 mg 5 mg 5 mg 5 mg 5 mg 5 mg 5 mg    Description        Continue warfarin 5mg  1 tablet daily.     INR was 2.2 today

## 2014-10-23 NOTE — Progress Notes (Signed)
Called to [facility] and they have a warfarin very similar to what patient was previously taking.  Rx sent to them.  Patient to call if he has any problems.

## 2014-10-27 ENCOUNTER — Ambulatory Visit (INDEPENDENT_AMBULATORY_CARE_PROVIDER_SITE_OTHER): Payer: Medicare Other | Admitting: Ophthalmology

## 2014-10-27 DIAGNOSIS — H33303 Unspecified retinal break, bilateral: Secondary | ICD-10-CM

## 2014-10-27 DIAGNOSIS — H35033 Hypertensive retinopathy, bilateral: Secondary | ICD-10-CM

## 2014-10-27 DIAGNOSIS — I10 Essential (primary) hypertension: Secondary | ICD-10-CM | POA: Diagnosis not present

## 2014-10-27 DIAGNOSIS — H3531 Nonexudative age-related macular degeneration: Secondary | ICD-10-CM

## 2014-10-27 DIAGNOSIS — H43813 Vitreous degeneration, bilateral: Secondary | ICD-10-CM

## 2014-10-28 ENCOUNTER — Ambulatory Visit (INDEPENDENT_AMBULATORY_CARE_PROVIDER_SITE_OTHER): Payer: Medicare Other | Admitting: Pharmacist Clinician (PhC)/ Clinical Pharmacy Specialist

## 2014-10-28 DIAGNOSIS — I482 Chronic atrial fibrillation, unspecified: Secondary | ICD-10-CM

## 2014-10-28 LAB — POCT INR: INR: 2.4

## 2014-10-28 MED ORDER — NYSTATIN 100000 UNIT/ML MT SUSP: OROMUCOSAL | Status: DC

## 2014-10-30 ENCOUNTER — Ambulatory Visit (INDEPENDENT_AMBULATORY_CARE_PROVIDER_SITE_OTHER): Payer: Medicare Other | Admitting: Physician Assistant

## 2014-10-30 ENCOUNTER — Other Ambulatory Visit: Payer: Self-pay | Admitting: Pharmacist

## 2014-10-30 ENCOUNTER — Encounter: Payer: Self-pay | Admitting: Physician Assistant

## 2014-10-30 VITALS — BP 147/89 | HR 74 | Temp 97.7°F | Ht 71.0 in | Wt 186.0 lb

## 2014-10-30 DIAGNOSIS — B370 Candidal stomatitis: Secondary | ICD-10-CM | POA: Diagnosis not present

## 2014-10-30 MED ORDER — WARFARIN SODIUM 5 MG PO TABS: 5.0000 mg | ORAL_TABLET | Freq: Every day | ORAL | Status: DC

## 2014-10-30 MED ORDER — JANTOVEN 5 MG PO TABS: 5.0000 mg | ORAL_TABLET | Freq: Every day | ORAL | Status: DC

## 2014-10-30 MED ORDER — NYSTATIN 100000 UNIT/ML MT SUSP: 5.0000 mL | Freq: Four times a day (QID) | OROMUCOSAL | Status: DC

## 2014-10-30 NOTE — Patient Instructions (Addendum)
Take 40mL four times daily x 7 days. Then 61mL daily for thrush prevention Thrush, Adult  Ritta Slot is an infection that can happen on the mouth, throat, tongue, or other areas. It causes white patches to form on the mouth and tongue. HOME CARE  Only take medicine as told by your doctor. You may be given medicine to swallow or to apply right on the area.  Eat plain yogurt that contains live cultures (check the label).  Rinse your mouth many times a day with a warm saltwater rinse. To make the rinse, mix 1 teaspoon (6 g) of salt in 8 ounces (0.2 L) of warm water. To reduce pain:  Drink cold liquids such as water or iced tea.  Eat frozen ice pops or frozen juices.  Eat foods that are easy to swallow, such as gelatin or ice cream.  Drink from a straw if the patches are painful. If you are breastfeeding:  Clean your nipples with an antifungal medicine.  Dry your nipples after breastfeeding.  Use an ointment called lanolin to help relieve nipple soreness. If you wear dentures:  Take out your dentures before going to bed.  Brush them thoroughly.  Soak them in a denture cleaner. GET HELP IF:   Your problems are getting worse.  Your problems are not improving within 7 days of starting treatment.  Your infection is spreading. This may show as white patches on the skin outside of your mouth.  You are nursing and have redness and pain in the nipples. MAKE SURE YOU:  Understand these instructions.  Will watch your condition.  Will get help right away if you are not doing well or get worse. Document Released: 07/13/2009 Document Revised: 02/06/2013 Document Reviewed: 11/19/2012 [facility] Patient Information 2015 [place], [place]. This information is not intended to replace advice given to you by your health care provider. Make sure you discuss any questions you have with your health care provider.

## 2014-10-30 NOTE — Progress Notes (Signed)
   Subjective:    Patient ID: [name], male    DOB: [birth year], 74 y.o.   MRN: [id number]  HPI 74 y/o male presents with c/o white tongue and burning x 2 days. He has been treated with Nystatin rinse in the past with resolution. He recently started Teve for his coumadin and feels that has contributed to his tongue. This has been a chronic problem for him.     Review of Systems  HENT:       White tongue with burning       Objective:   Physical Exam  Constitutional: He appears well-developed and well-nourished.  HENT:  White discoloration of tongue, resembling thrush  Posterior pharynx clear   Neurological: He is alert.          Assessment & Plan:  1. Oral candidiasis  - nystatin (MYCOSTATIN) 100000 UNIT/ML suspension; Take 5 mLs (500,000 Units total) by mouth 4 (four) times daily. X 7 days. Then once daily  Dispense: 473 mL; Refill: 3  - Patient stated that he might stop his coumadin over the weekend since this may be causing the thrush. I have advised him NOT to stop taking his coumadin. He may need to take 59mL daily after clearing to prevent recurrence.  - Eat yogurt, rinse with salt water daily   Continue all meds   Cormick Moss A. Benjamin Stain PA-C

## 2014-10-30 NOTE — Telephone Encounter (Signed)
Patient continued to have difficulty tolerating teva warfarin - had itching. Tongue tuned white when trid Taro warfarin.  Today he states that he would like to try [place].   Rx sent to [facility] at patient request.

## 2014-11-05 ENCOUNTER — Ambulatory Visit (INDEPENDENT_AMBULATORY_CARE_PROVIDER_SITE_OTHER): Payer: Medicare Other | Admitting: Physician Assistant

## 2014-11-05 ENCOUNTER — Encounter: Payer: Self-pay | Admitting: Physician Assistant

## 2014-11-05 VITALS — BP 148/85 | HR 66 | Temp 97.1°F | Ht 71.0 in | Wt 174.0 lb

## 2014-11-05 DIAGNOSIS — R1011 Right upper quadrant pain: Secondary | ICD-10-CM | POA: Diagnosis not present

## 2014-11-05 DIAGNOSIS — R110 Nausea: Secondary | ICD-10-CM

## 2014-11-05 NOTE — Patient Instructions (Signed)

## 2014-11-05 NOTE — Progress Notes (Signed)
Subjective:     Patient ID: [name], male   DOB: 1940-12-02, 74 y.o.   MRN: [id number]  HPI Pt with nausea over the last couple of days He denies any fever, chills, or vomiting + hx of constipation and is only daily Miralax but still having sx  Review of Systems  Constitutional: Positive for appetite change. Negative for fever, chills, diaphoresis, activity change, fatigue and unexpected weight change.  Gastrointestinal: Positive for nausea and constipation. Negative for vomiting and diarrhea.       Objective:   Physical Exam  Constitutional: He appears well-developed and well-nourished.  HENT:  Mouth/Throat: Oropharynx is clear and moist. No oropharyngeal exudate.  Neck: Neck supple.  Cardiovascular: Normal rate, regular rhythm, normal heart sounds and intact distal pulses.   Pulmonary/Chest: Effort normal and breath sounds normal.  Abdominal: Soft. Bowel sounds are normal. He exhibits no distension and no mass. There is tenderness. There is no rebound and no guarding.  RUQ TTP  Lymphadenopathy:    He has no cervical adenopathy.  Nursing note and vitals reviewed.      Assessment:     Nausea    Plan:     Discussed may be related to his constipation Will have him hydrate well OTC Milk of Mag He is already on Prilosec bid so no changes F/U if sx cont

## 2014-11-07 ENCOUNTER — Encounter: Payer: Self-pay | Admitting: Cardiology

## 2014-11-10 ENCOUNTER — Telehealth: Payer: Self-pay | Admitting: Physician Assistant

## 2014-11-10 NOTE — Telephone Encounter (Signed)
Still has the thrush, will discuss this at appt w/ pharmacist tomorrow.

## 2014-11-11 ENCOUNTER — Other Ambulatory Visit: Payer: Self-pay | Admitting: Physician Assistant

## 2014-11-11 ENCOUNTER — Ambulatory Visit (INDEPENDENT_AMBULATORY_CARE_PROVIDER_SITE_OTHER): Payer: Medicare Other | Admitting: Pharmacist Clinician (PhC)/ Clinical Pharmacy Specialist

## 2014-11-11 DIAGNOSIS — B370 Candidal stomatitis: Secondary | ICD-10-CM

## 2014-11-11 DIAGNOSIS — I482 Chronic atrial fibrillation, unspecified: Secondary | ICD-10-CM

## 2014-11-11 LAB — POCT INR: INR: 2.6

## 2014-11-11 MED ORDER — FLUCONAZOLE 150 MG PO TABS: 150.0000 mg | ORAL_TABLET | Freq: Every day | ORAL | Status: DC

## 2014-11-11 NOTE — Addendum Note (Signed)
Addended by: Marylin Crosby on: 11/11/2014 12:01 PM   Modules accepted: Orders

## 2014-11-12 ENCOUNTER — Encounter: Payer: Self-pay | Admitting: Internal Medicine

## 2014-11-12 ENCOUNTER — Telehealth: Payer: Self-pay | Admitting: Physician Assistant

## 2014-11-12 NOTE — Telephone Encounter (Signed)
Pt took Diflucan yesterday He is itching today Pt instructed to take Benadryl for itching Verbalizes understanding

## 2014-11-12 NOTE — Telephone Encounter (Signed)
Spoke with pt regarding Benadryl

## 2014-11-18 ENCOUNTER — Ambulatory Visit (INDEPENDENT_AMBULATORY_CARE_PROVIDER_SITE_OTHER): Payer: Medicare Other | Admitting: Pharmacist Clinician (PhC)/ Clinical Pharmacy Specialist

## 2014-11-18 DIAGNOSIS — I482 Chronic atrial fibrillation, unspecified: Secondary | ICD-10-CM

## 2014-11-18 LAB — POCT INR: INR: 3.5

## 2014-11-18 NOTE — Patient Instructions (Signed)
Anticoagulation Dose Instructions as of 11/18/2014      Dorene Grebe Tue Wed Thu Fri Sat   New Dose 5 mg 5 mg 5 mg 2.5 mg 5 mg 5 mg 2.5 mg    Description        No warfarin today then follow above schedule

## 2014-11-20 ENCOUNTER — Telehealth: Payer: Self-pay | Admitting: Cardiology

## 2014-11-20 NOTE — Telephone Encounter (Signed)
Patient called thinking his thrush coming from Warfarin  Stated that his Warfarin manufacturer was changed in June and six days later his thrush returned Patient had thrush originally in May after taking an antibiotic, was treated and cleared up Since getting the thrush in June patient has tried Warfarin from different manufacturer and Ervin Knack but unable to get rid of the thrush Discussed with Elita Boone D and confirmed thrush should not be coming from these medications  Advised daughter that patient needs to continue Warfarin If PCP unable to clear up thrush ask to be referred to specialist

## 2014-11-20 NOTE — Telephone Encounter (Signed)
New message    Patient calling would like to speak with nurse

## 2014-11-21 NOTE — Telephone Encounter (Signed)
Agree with advice given

## 2014-11-27 ENCOUNTER — Encounter (HOSPITAL_COMMUNITY): Payer: Self-pay | Admitting: Vascular Surgery

## 2014-11-27 ENCOUNTER — Observation Stay (HOSPITAL_COMMUNITY)
Admission: EM | Admit: 2014-11-27 | Discharge: 2014-11-28 | Disposition: A | Discharge status: Home / Self Care | Payer: Medicare Other | Attending: Cardiovascular Disease | Admitting: Cardiovascular Disease

## 2014-11-27 ENCOUNTER — Emergency Department (HOSPITAL_COMMUNITY): Payer: Medicare Other

## 2014-11-27 DIAGNOSIS — R079 Chest pain, unspecified: Principal | ICD-10-CM | POA: Diagnosis present

## 2014-11-27 DIAGNOSIS — F410 Panic disorder [episodic paroxysmal anxiety] without agoraphobia: Secondary | ICD-10-CM | POA: Insufficient documentation

## 2014-11-27 DIAGNOSIS — I4891 Unspecified atrial fibrillation: Secondary | ICD-10-CM | POA: Diagnosis present

## 2014-11-27 DIAGNOSIS — Z7951 Long term (current) use of inhaled steroids: Secondary | ICD-10-CM | POA: Insufficient documentation

## 2014-11-27 DIAGNOSIS — I10 Essential (primary) hypertension: Secondary | ICD-10-CM | POA: Diagnosis present

## 2014-11-27 DIAGNOSIS — F329 Major depressive disorder, single episode, unspecified: Secondary | ICD-10-CM | POA: Insufficient documentation

## 2014-11-27 DIAGNOSIS — R011 Cardiac murmur, unspecified: Secondary | ICD-10-CM | POA: Insufficient documentation

## 2014-11-27 DIAGNOSIS — R110 Nausea: Secondary | ICD-10-CM | POA: Insufficient documentation

## 2014-11-27 DIAGNOSIS — I25119 Atherosclerotic heart disease of native coronary artery with unspecified angina pectoris: Secondary | ICD-10-CM | POA: Insufficient documentation

## 2014-11-27 DIAGNOSIS — Z950 Presence of cardiac pacemaker: Secondary | ICD-10-CM | POA: Insufficient documentation

## 2014-11-27 DIAGNOSIS — I442 Atrioventricular block, complete: Secondary | ICD-10-CM | POA: Insufficient documentation

## 2014-11-27 DIAGNOSIS — R0602 Shortness of breath: Secondary | ICD-10-CM | POA: Insufficient documentation

## 2014-11-27 DIAGNOSIS — K449 Diaphragmatic hernia without obstruction or gangrene: Secondary | ICD-10-CM | POA: Insufficient documentation

## 2014-11-27 DIAGNOSIS — I255 Ischemic cardiomyopathy: Secondary | ICD-10-CM | POA: Diagnosis present

## 2014-11-27 DIAGNOSIS — K219 Gastro-esophageal reflux disease without esophagitis: Secondary | ICD-10-CM | POA: Insufficient documentation

## 2014-11-27 DIAGNOSIS — Z79899 Other long term (current) drug therapy: Secondary | ICD-10-CM | POA: Insufficient documentation

## 2014-11-27 DIAGNOSIS — E785 Hyperlipidemia, unspecified: Secondary | ICD-10-CM | POA: Diagnosis present

## 2014-11-27 DIAGNOSIS — I200 Unstable angina: Secondary | ICD-10-CM

## 2014-11-27 DIAGNOSIS — Z85828 Personal history of other malignant neoplasm of skin: Secondary | ICD-10-CM | POA: Insufficient documentation

## 2014-11-27 DIAGNOSIS — N400 Enlarged prostate without lower urinary tract symptoms: Secondary | ICD-10-CM | POA: Insufficient documentation

## 2014-11-27 DIAGNOSIS — Z87891 Personal history of nicotine dependence: Secondary | ICD-10-CM | POA: Insufficient documentation

## 2014-11-27 DIAGNOSIS — M797 Fibromyalgia: Secondary | ICD-10-CM | POA: Insufficient documentation

## 2014-11-27 DIAGNOSIS — Z951 Presence of aortocoronary bypass graft: Secondary | ICD-10-CM | POA: Insufficient documentation

## 2014-11-27 DIAGNOSIS — Q6100 Congenital renal cyst, unspecified: Secondary | ICD-10-CM | POA: Insufficient documentation

## 2014-11-27 DIAGNOSIS — I5022 Chronic systolic (congestive) heart failure: Secondary | ICD-10-CM | POA: Insufficient documentation

## 2014-11-27 DIAGNOSIS — E871 Hypo-osmolality and hyponatremia: Secondary | ICD-10-CM | POA: Insufficient documentation

## 2014-11-27 LAB — BASIC METABOLIC PANEL
ANION GAP: 5 (ref 5–15)
BUN: 13 mg/dL (ref 6–20)
CALCIUM: 8.8 mg/dL — AB (ref 8.9–10.3)
CO2: 30 mmol/L (ref 22–32)
Chloride: 94 mmol/L — ABNORMAL LOW (ref 101–111)
Creatinine, Ser: 1.1 mg/dL (ref 0.61–1.24)
Glucose, Bld: 99 mg/dL (ref 65–99)
POTASSIUM: 4.7 mmol/L (ref 3.5–5.1)
Sodium: 129 mmol/L — ABNORMAL LOW (ref 135–145)

## 2014-11-27 LAB — CBC
HCT: 39.2 % (ref 39.0–52.0)
HEMOGLOBIN: 13.3 g/dL (ref 13.0–17.0)
MCH: 30.9 pg (ref 26.0–34.0)
MCHC: 33.9 g/dL (ref 30.0–36.0)
MCV: 91.2 fL (ref 78.0–100.0)
PLATELETS: 155 10*3/uL (ref 150–400)
RBC: 4.3 MIL/uL (ref 4.22–5.81)
RDW: 13.6 % (ref 11.5–15.5)
WBC: 6.1 10*3/uL (ref 4.0–10.5)

## 2014-11-27 MED ORDER — ASPIRIN 81 MG PO CHEW
324.0000 mg | CHEWABLE_TABLET | Freq: Once | ORAL | Status: DC
  Filled 2014-11-27: qty 4

## 2014-11-27 MED ORDER — FLUORESCEIN SODIUM 1 MG OP STRP
1.0000 | ORAL_STRIP | Freq: Once | OPHTHALMIC | Status: AC
  Administered 2014-11-28: 1 via OPHTHALMIC
  Filled 2014-11-27: qty 1

## 2014-11-27 MED ORDER — MORPHINE SULFATE 4 MG/ML IJ SOLN
4.0000 mg | Freq: Once | INTRAMUSCULAR | Status: AC
  Administered 2014-11-28: 4 mg via INTRAVENOUS
  Filled 2014-11-27: qty 1

## 2014-11-27 MED ORDER — NITROGLYCERIN IN D5W 200-5 MCG/ML-% IV SOLN
10.0000 ug/min | INTRAVENOUS | Status: DC
  Administered 2014-11-28: 10 ug/min via INTRAVENOUS
  Filled 2014-11-27: qty 250

## 2014-11-27 NOTE — ED Provider Notes (Signed)
CSN: [id number]     Arrival date & time 11/27/14  2126 History  This chart was scribed for Delora Fuel, MD by Eustaquio Maize, ED Scribe. This patient was seen in room A02C/A02C and the patient's care was started at 11:42 PM.  Chief Complaint  Patient presents with  . Chest Pain   The history is provided by the patient. No language interpreter was used.   HPI Comments: [name] is a 74 y.o. male with hx CAD, HTN, HLD, atrial fibrillation, CHF, and pacemaker who presents to the Emergency Department complaining of sudden onset, diffuse, chest pain that began around 3 PM today (approximately 8 hours ago). Pt states that he was walking around his house when the pain came on. He describes the pain as a tight pressure in his chest. Pt mentions that he also has nausea, mild shortness of breath, and right arm pain. Pt took 3 NTG and 324 mg Aspirin which alleviated his pain. He mentions that he began having similar chest pain again around 7 PM (approximately 5 hours ago). Pt took 1 NTG for the second episode of chest pain without relief. He mentions that he still has chest pain and rates it as a 9/10 on the pain scale. Pt is currently on Warfarin. Denies diaphoresis, vomiting, painful leg swelling, or any other symptoms.   Past Medical History  Diagnosis Date  . Ischemic cardiomyopathy     a. 06/2012 EF 25% by echo.  . Hypertension   . Hyperlipidemia   . GERD (gastroesophageal reflux disease)   . CAD (coronary artery disease)     a. 1991 s/p CABGx4;  b. redo CABG in 2007 (SVG->OM, VG->PDA->PLV);  c. 01/2011 Cath: LM 80ost, LAD 100p, LCX 100ost, OM1 100, RCA 100, LIMA->LAD ok, VG->PDA ok, VG->OM ok;  d. 03/2012 MV: EF 32% inf & inflat scar w/o ischemia; e. Lexi MV: EF 33%, inf infarct, no ischemia. f. lexiscan 06/12/2014 RCA scar, no ischemia  . Atrial flutter     a. 01/2011 s/p unsuccessful RFCA complicated by CHB req PPM.  . CHB (complete heart block)     a. 01/2011: in setting of RFCA, s/p SJM 2110  [facility] DC PPM, ser # [id number].  [name] Kitchen Kidney cysts     a. bilateral  . Hiatal hernia     a. 03/01/2013 EGD and esoph dil.  [name] Kitchen BPH (benign prostatic hyperplasia)     a. elevated PSA  . A-fib     a. chronic coumadin  . Elevated PSA, less than 10 ng/ml   . Fibromyalgia   . Skin cancer     'above left eyebrow; tip of my nose" (08/29/2012)  . Depression     "since 1986-1987" (08/29/2012)  . Anxiety   . Panic attacks   . Chronic systolic CHF (congestive heart failure), NYHA class 2     a. 06/2012 Echo: EF 25%, distal sept and apical AK, sev dil LV, mild LVH, mod dil LA.  [name] Kitchen Pacemaker    Past Surgical History  Procedure Laterality Date  . Cardiac catheterization  10/30/2009    Grafts patent. Mild to moderate LV dysfunction. EF 45%. Managed medically.   . Cardiac catheterization      multiple  . Tonsillectomy  1960  . Cholecystectomy  2007  . Insert / replace / remove pacemaker  01/2011    initial placement  . Inguinal hernia repair  1960's    "left I think"  . Cataract extraction w/ intraocular lens  implant, bilateral  2012  . Cardioversion  05/25/2012    Procedure: CARDIOVERSION;  Surgeon: Darlin Coco, MD;  Location: [facility] [place] ENDOSCOPY;  Service: Cardiovascular;  Laterality: N/A;  . Humerus surgery Left 1960    "growth on the bone taken off; not cancer" (08/29/2012)  . Excisional hemorrhoidectomy  ~ 1980  . Skin cancer excision      'above left eyebrow; tip of my nose" (08/29/2012)  . Atrial flutter ablation  01/2011    "didn't work" (08/29/2012)  . Coronary artery bypass graft  08/27/1989    CABG X 5  . Coronary artery bypass graft  06/2005    CABG X 3   Family History  Problem Relation Age of Onset  . Stroke Mother   . Heart attack Father     died @ 38, mult MI's.  [name] Kitchen Heart disease Father   . Hypertension Father   . Diabetes Sister    History  Substance Use Topics  . Smoking status: Former Smoker -- 0.50 packs/day for 10 years    Types: Cigarettes    Quit date: 09/21/1973  .  Smokeless tobacco: Never Used  . Alcohol Use: No    Review of Systems  Constitutional: Negative for diaphoresis.  Respiratory: Positive for shortness of breath. Negative for cough.   Cardiovascular: Positive for chest pain. Negative for leg swelling.  Gastrointestinal: Positive for nausea. Negative for vomiting.  All other systems reviewed and are negative.  Allergies  Sulfa drugs cross reactors; Ultram; and Xarelto  Home Medications   Prior to Admission medications   Medication Sig Start Date End Date Taking? Authorizing Provider  acetaminophen (TYLENOL) 500 MG tablet Take 500 mg by mouth every 8 (eight) hours as needed.    Historical Provider, MD  ALPRAZolam Duanne Moron) 1 MG tablet Take 1 tablet (1 mg total) by mouth 3 (three) times daily as needed. For anxiety 10/09/14   Darlin Coco, MD  atorvastatin (LIPITOR) 40 MG tablet Take 0.5-1 tablets (20-40 mg total) by mouth every other day. TAKE 1 BY MOUTH ALTERNATING WITH 1/2   TABLET EVERY OTHER DAY AS DIRECTED 10/09/14   Darlin Coco, MD  carvedilol (COREG) 6.25 MG tablet TAKE 1 BY MOUTH TWICE DAILY 10/09/14   Darlin Coco, MD  doxepin (SINEQUAN) 25 MG capsule 1 capsule every morning and 2 capsules at night 01/16/14   Darlin Coco, MD  fluconazole (DIFLUCAN) 150 MG tablet Take 1 tablet (150 mg total) by mouth daily. 11/11/14   Tiffany A Gann, PA-C  fluticasone (FLONASE) 50 MCG/ACT nasal spray Place 2 sprays into both nostrils daily. 09/17/14   Tiffany A Gann, PA-C  furosemide (LASIX) 20 MG tablet One tablet by mouth daily as needed for swelling 08/22/14   Darlin Coco, MD  gabapentin (NEURONTIN) 100 MG capsule TAKE 1 BY MOUTH 4 TIMES DAILY 04/07/14   Darlin Coco, MD  hydroxypropyl methylcellulose (ISOPTO TEARS) 2.5 % ophthalmic solution Place 1 drop into both eyes 4 (four) times daily as needed. Dry eyes    Historical Provider, MD  isosorbide mononitrate (IMDUR) 60 MG 24 hr tablet TAKE 1 BY MOUTH DAILY 10/09/14   Darlin Coco,  MD  JANTOVEN 5 MG tablet Take 1 tablet (5 mg total) by mouth daily. 10/30/14   Tammy Eckard, PHARMD  lisinopril (PRINIVIL,ZESTRIL) 5 MG tablet 1 tablet by mouth every other day or as directed 10/09/14   Darlin Coco, MD  Multiple Vitamin (MULTIVITAMIN) tablet Take 1 tablet by mouth daily.      Historical Provider, MD  nitroGLYCERIN (NITROSTAT)  0.4 MG SL tablet Place 1 tablet (0.4 mg total) under the tongue every 5 (five) minutes as needed. For chest pain 07/05/13   Darlin Coco, MD  nystatin (MYCOSTATIN) 100000 UNIT/ML suspension Take 5 mLs (500,000 Units total) by mouth 4 (four) times daily. X 7 days. Then once daily 10/30/14   Tiffany A Gann, PA-C  omeprazole (PRILOSEC) 20 MG capsule Take 1 capsule (20 mg total) by mouth 2 (two) times daily. 03/25/13   Darlin Coco, MD  polyethylene glycol [facility] / GLYCOLAX) packet Take 17 g by mouth daily.     Historical Provider, MD   Triage Vitals: BP 142/68 mmHg  Pulse 66  Temp(Src) 98.2 F (36.8 C) (Oral)  Resp 16  SpO2 99%   Physical Exam  Constitutional: He is oriented to person, place, and time. He appears well-developed and well-nourished. No distress.  HENT:  Head: Normocephalic and atraumatic.  Eyes: Conjunctivae and EOM are normal.  Neck: Neck supple. No tracheal deviation present.  Cardiovascular: Normal rate and regular rhythm.   Murmur heard.  Systolic murmur is present with a grade of 2/6  2/6 holosystolic murmur at the base   Pulmonary/Chest: Effort normal. No respiratory distress.  Musculoskeletal: Normal range of motion.  Neurological: He is alert and oriented to person, place, and time.  Skin: Skin is warm and dry.  Psychiatric: He has a normal mood and affect. His behavior is normal.  Nursing note and vitals reviewed.   ED Course  Procedures (including critical care time)  DIAGNOSTIC STUDIES: Oxygen Saturation is 99% on RA, normal by my interpretation.    COORDINATION OF CARE: 11:47 PM-Discussed treatment plan  which includes protime INR and Troponin with pt at bedside and pt agreed to plan.   Labs Review Results for orders placed or performed during the hospital encounter of 76/73/41  Basic metabolic panel  Result Value Ref Range   Sodium 129 (L) 135 - 145 mmol/L   Potassium 4.7 3.5 - 5.1 mmol/L   Chloride 94 (L) 101 - 111 mmol/L   CO2 30 22 - 32 mmol/L   Glucose, Bld 99 65 - 99 mg/dL   BUN 13 6 - 20 mg/dL   Creatinine, Ser 1.10 0.61 - 1.24 mg/dL   Calcium 8.8 (L) 8.9 - 10.3 mg/dL   GFR calc non Af Amer >60 >60 mL/min   GFR calc Af Amer >60 >60 mL/min   Anion gap 5 5 - 15  CBC  Result Value Ref Range   WBC 6.1 4.0 - 10.5 K/uL   RBC 4.30 4.22 - 5.81 MIL/uL   Hemoglobin 13.3 13.0 - 17.0 g/dL   HCT 39.2 39.0 - 52.0 %   MCV 91.2 78.0 - 100.0 fL   MCH 30.9 26.0 - 34.0 pg   MCHC 33.9 30.0 - 36.0 g/dL   RDW 13.6 11.5 - 15.5 %   Platelets 155 150 - 400 K/uL  Troponin I  Result Value Ref Range   Troponin I <0.03 <0.031 ng/mL  Protime-INR  Result Value Ref Range   Prothrombin Time 20.9 (H) 11.6 - 15.2 seconds   INR 1.81 (H) 0.00 - 1.49  Heparin level (unfractionated)  Result Value Ref Range   Heparin Unfractionated 0.26 (L) 0.30 - 0.70 IU/mL  Troponin I  Result Value Ref Range   Troponin I <0.03 <0.031 ng/mL  Troponin I  Result Value Ref Range   Troponin I <0.03 <0.031 ng/mL  TSH  Result Value Ref Range   TSH 5.469 (H) 0.350 -  4.500 uIU/mL  Hemoglobin A1c  Result Value Ref Range   Hgb A1c MFr Bld 5.9 (H) 4.8 - 5.6 %   Mean Plasma Glucose 123 mg/dL  Lipid panel  Result Value Ref Range   Cholesterol 120 0 - 200 mg/dL   Triglycerides 29 <150 mg/dL   HDL 46 >40 mg/dL   Total CHOL/HDL Ratio 2.6 RATIO   VLDL 6 0 - 40 mg/dL   LDL Cholesterol 68 0 - 99 mg/dL  Basic metabolic panel  Result Value Ref Range   Sodium 133 (L) 135 - 145 mmol/L   Potassium 4.4 3.5 - 5.1 mmol/L   Chloride 97 (L) 101 - 111 mmol/L   CO2 30 22 - 32 mmol/L   Glucose, Bld 98 65 - 99 mg/dL   BUN 12 6 - 20  mg/dL   Creatinine, Ser 1.05 0.61 - 1.24 mg/dL   Calcium 9.1 8.9 - 10.3 mg/dL   GFR calc non Af Amer >60 >60 mL/min   GFR calc Af Amer >60 >60 mL/min   Anion gap 6 5 - 15   Imaging Review Dg Chest 2 View  11/27/2014   CLINICAL DATA:  Chest pain beginning at 1430 hours, with nausea. Elbow pain. History of hypertension, ischemic cardiomyopathy, coronary artery disease.  EXAM: CHEST  2 VIEW  COMPARISON:  Chest radiograph June 11, 2014  FINDINGS: The cardiac silhouette is upper limits of normal in size and unchanged. 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 cardiac pacemaker in situ. Strandy densities LEFT lung base, with mildly elevated LEFT hemidiaphragm is similar. No pleural effusion or focal consolidation. No pneumothorax. Surgical clips in the included right abdomen compatible with cholecystectomy. Patient is mildly osteopenic.  IMPRESSION: Stable borderline cardiomegaly, LEFT lung base atelectasis/ scarring.   Electronically Signed   By: Elon Alas M.D.   On: 11/27/2014 22:35     EKG Interpretation   Date/Time:  Thursday November 27 2014 21:32:07 EDT Ventricular Rate:  64 PR Interval:  136 QRS Duration: 92 QT Interval:  410 QTC Calculation: 422 R Axis:   -21 Text Interpretation:  Sinus rhythm Ventricular demand pacing Inferior  infarct , age undetermined Possible Anterior infarct , age undetermined ST  \T\ T wave abnormality, consider lateral ischemia Abnormal ECG When  compared with ECG of 06/12/2014, Sinus rhythm beats are present - unable to  assess  ST-t abnormality in prior ECG because of paced rhythm Confirmed by  [facility]  MD, Alisea Matte ([id number]) on 11/27/2014 11:37:17 PM      CRITICAL CARE Performed by: NGEXB,[email or web address] Total critical care time: 40 minutes Critical care time was exclusive of separately billable procedures and treating other patients. Critical care was necessary to treat or prevent imminent or life-threatening deterioration. Critical care was time  spent personally by me on the following activities: development of treatment plan with patient and/or surrogate as well as nursing, discussions with consultants, evaluation of patient's response to treatment, examination of patient, obtaining history from patient or surrogate, ordering and performing treatments and interventions, ordering and review of laboratory studies, ordering and review of radiographic studies, pulse oximetry and re-evaluation of patient's condition.  MDM   Final diagnoses:  Chest pain, unspecified chest pain type  Unstable angina  Hyponatremia    Chest pain in pattern suggestive of unstable angina. Review of old records shows that he has had bypass surgery done on 2 occasions. Most recent catheterization was done in 2012 and showed patent grafts. Most recent stress Myoview study was in  February showing a fixed wall motion deficit but no ischemia. He is treated as unstable angina and is started on heparin and nitroglycerin. Initial troponin is negative. I discussed the case with cardiology felt he should be admitted initially to the internal medicine service and they would see him in consultation. Case is discussed with Dr. Alcario Drought of [facility] who agrees to admit the patient.   I personally performed the services described in this documentation, which was scribed in my presence. The recorded information has been reviewed and is accurate.       Delora Fuel, MD 23/55/73 2202

## 2014-11-27 NOTE — ED Notes (Signed)
Pt reports to the ED for eval of 2 episodes of CP. He reports the first episode occurred while he was walking around the house. He took ASA and nitro and the pain went away. He then had a second episode of CP and he took another nitro and decided to come in. He also has some right arm pain. Reports associated symptoms of nausea and lightheadedness. Denies any SOB. He has a significant cardiac hx. Pt A&Ox4, resp e/u, and skin warm and dry. 12 lead obtained in triage.

## 2014-11-27 NOTE — ED Notes (Signed)
Dr. Glick at bedside.  

## 2014-11-28 DIAGNOSIS — R079 Chest pain, unspecified: Secondary | ICD-10-CM

## 2014-11-28 DIAGNOSIS — R110 Nausea: Secondary | ICD-10-CM | POA: Diagnosis not present

## 2014-11-28 DIAGNOSIS — I255 Ischemic cardiomyopathy: Secondary | ICD-10-CM

## 2014-11-28 DIAGNOSIS — E871 Hypo-osmolality and hyponatremia: Secondary | ICD-10-CM | POA: Diagnosis not present

## 2014-11-28 DIAGNOSIS — I482 Chronic atrial fibrillation: Secondary | ICD-10-CM

## 2014-11-28 DIAGNOSIS — I200 Unstable angina: Secondary | ICD-10-CM | POA: Insufficient documentation

## 2014-11-28 DIAGNOSIS — I5043 Acute on chronic combined systolic (congestive) and diastolic (congestive) heart failure: Secondary | ICD-10-CM

## 2014-11-28 DIAGNOSIS — R011 Cardiac murmur, unspecified: Secondary | ICD-10-CM | POA: Diagnosis not present

## 2014-11-28 LAB — BASIC METABOLIC PANEL
ANION GAP: 6 (ref 5–15)
BUN: 12 mg/dL (ref 6–20)
CO2: 30 mmol/L (ref 22–32)
Calcium: 9.1 mg/dL (ref 8.9–10.3)
Chloride: 97 mmol/L — ABNORMAL LOW (ref 101–111)
Creatinine, Ser: 1.05 mg/dL (ref 0.61–1.24)
GLUCOSE: 98 mg/dL (ref 65–99)
Potassium: 4.4 mmol/L (ref 3.5–5.1)
Sodium: 133 mmol/L — ABNORMAL LOW (ref 135–145)

## 2014-11-28 LAB — TROPONIN I: Troponin I: <0.03 ng/mL (ref <0.031)

## 2014-11-28 LAB — LIPID PANEL
CHOL/HDL RATIO: 2.6 ratio
CHOLESTEROL: 120 mg/dL (ref 0–200)
HDL: 46 mg/dL (ref >40)
LDL CALC: 68 mg/dL (ref 0–99)
Triglycerides: 29 mg/dL (ref <150)
VLDL: 6 mg/dL (ref 0–40)

## 2014-11-28 LAB — HEPARIN LEVEL (UNFRACTIONATED): Heparin Unfractionated: 0.26 IU/mL — ABNORMAL LOW (ref 0.30–0.70)

## 2014-11-28 LAB — TSH: TSH: 5.469 u[IU]/mL — ABNORMAL HIGH (ref 0.350–4.500)

## 2014-11-28 LAB — PROTIME-INR
INR: 1.81 — ABNORMAL HIGH (ref 0.00–1.49)
PROTHROMBIN TIME: 20.9 s — AB (ref 11.6–15.2)

## 2014-11-28 MED ORDER — POLYETHYLENE GLYCOL 3350 17 G PO PACK: 17.0000 g | PACK | Freq: Every day | ORAL | Status: DC | PRN

## 2014-11-28 MED ORDER — PANTOPRAZOLE SODIUM 40 MG PO TBEC
40.0000 mg | DELAYED_RELEASE_TABLET | Freq: Every day | ORAL | Status: DC
  Filled 2014-11-28: qty 1

## 2014-11-28 MED ORDER — FUROSEMIDE 10 MG/ML IJ SOLN
40.0000 mg | Freq: Once | INTRAMUSCULAR | Status: AC
  Administered 2014-11-28: 40 mg via INTRAVENOUS

## 2014-11-28 MED ORDER — HEPARIN (PORCINE) IN NACL 100-0.45 UNIT/ML-% IJ SOLN
1100.0000 [IU]/h | INTRAMUSCULAR | Status: DC
  Administered 2014-11-28: 950 [IU]/h via INTRAVENOUS
  Filled 2014-11-28: qty 250

## 2014-11-28 MED ORDER — ACETAMINOPHEN 500 MG PO TABS: 500.0000 mg | ORAL_TABLET | Freq: Three times a day (TID) | ORAL | Status: DC | PRN

## 2014-11-28 MED ORDER — ATORVASTATIN CALCIUM 80 MG PO TABS: 80.0000 mg | ORAL_TABLET | Freq: Every day | ORAL | Status: DC

## 2014-11-28 MED ORDER — MORPHINE SULFATE 4 MG/ML IJ SOLN
4.0000 mg | Freq: Once | INTRAMUSCULAR | Status: AC
  Administered 2014-11-28: 4 mg via INTRAVENOUS
  Filled 2014-11-28: qty 1

## 2014-11-28 MED ORDER — PANTOPRAZOLE SODIUM 40 MG PO TBEC
40.0000 mg | DELAYED_RELEASE_TABLET | Freq: Every day | ORAL | Status: DC
  Administered 2014-11-28: 40 mg via ORAL

## 2014-11-28 MED ORDER — GABAPENTIN 100 MG PO CAPS
100.0000 mg | ORAL_CAPSULE | Freq: Four times a day (QID) | ORAL | Status: DC
  Administered 2014-11-28: 100 mg via ORAL
  Filled 2014-11-28: qty 1

## 2014-11-28 MED ORDER — CARVEDILOL 12.5 MG PO TABS
12.5000 mg | ORAL_TABLET | Freq: Two times a day (BID) | ORAL | Status: DC
  Administered 2014-11-28: 12.5 mg via ORAL
  Filled 2014-11-28: qty 1

## 2014-11-28 MED ORDER — FUROSEMIDE 10 MG/ML IJ SOLN
INTRAMUSCULAR | Status: AC
  Filled 2014-11-28: qty 4

## 2014-11-28 MED ORDER — LISINOPRIL 5 MG PO TABS
5.0000 mg | ORAL_TABLET | Freq: Every day | ORAL | Status: DC
  Administered 2014-11-28: 5 mg via ORAL
  Filled 2014-11-28: qty 1

## 2014-11-28 MED ORDER — ALPRAZOLAM 0.5 MG PO TABS
1.0000 mg | ORAL_TABLET | Freq: Three times a day (TID) | ORAL | Status: DC | PRN
  Administered 2014-11-28: 1 mg via ORAL
  Filled 2014-11-28: qty 2

## 2014-11-28 MED ORDER — FUROSEMIDE 10 MG/ML IJ SOLN
40.0000 mg | Freq: Once | INTRAMUSCULAR | Status: AC
  Administered 2014-11-28: 40 mg via INTRAVENOUS
  Filled 2014-11-28: qty 4

## 2014-11-28 MED ORDER — ASPIRIN EC 81 MG PO TBEC: 81.0000 mg | DELAYED_RELEASE_TABLET | Freq: Every day | ORAL | Status: DC

## 2014-11-28 MED ORDER — NITROGLYCERIN 0.4 MG SL SUBL: 0.4000 mg | SUBLINGUAL_TABLET | SUBLINGUAL | Status: DC | PRN

## 2014-11-28 MED ORDER — DOXEPIN HCL 25 MG PO CAPS
25.0000 mg | ORAL_CAPSULE | Freq: Two times a day (BID) | ORAL | Status: DC
  Administered 2014-11-28: 25 mg via ORAL
  Filled 2014-11-28 (×2): qty 1

## 2014-11-28 MED ORDER — FLUTICASONE PROPIONATE 50 MCG/ACT NA SUSP
2.0000 | Freq: Every day | NASAL | Status: DC
  Filled 2014-11-28: qty 16

## 2014-11-28 MED ORDER — LORATADINE 10 MG PO TABS
10.0000 mg | ORAL_TABLET | Freq: Every day | ORAL | Status: DC
  Administered 2014-11-28: 10 mg via ORAL
  Filled 2014-11-28: qty 1

## 2014-11-28 MED ORDER — ONDANSETRON HCL 4 MG/2ML IJ SOLN: 4.0000 mg | Freq: Four times a day (QID) | INTRAMUSCULAR | Status: DC | PRN

## 2014-11-28 NOTE — H&P (Signed)
HPI: [name] is a 74 yo man, pt of Dr. Sherryl Barters, with ICM, EF 25%, CABG in 1991 and redo 2007 with most recent cath in 2012 showing patent grafts, AFL, CHB s/p DC PPM who presents with chest pain.  Reports that first episode was earlier today around 5 pm, lasted about an hour.  Located center chest, dull ache, associated with nausea.  Took ASA x 4 and NTG x3 and eventually resolved.  However, had recurrence around 7 pm or so with another 3 NTG and no resolution prompting visit to ED.  Also noted some right elbow discomfort with chest pain.  Still reports some discomfort but significantly better.  On heparin drip and NTG drip per EDP.  Initial troponin drawn at 00:08 (approximately 5 hours after onset of second episode) negative.  ECG with demand V-pacing   Review of Systems:     Cardiac Review of Systems: {Y] = yes [ ]  = no  Chest Pain [ x   ]  Resting SOB [   ] Exertional SOB  [  ]  Orthopnea [x  ]   Pedal Edema [   ]    Palpitations [  ] Syncope  [  ]   Presyncope [   ]  General Review of Systems: [Y] = yes [  ]=no Constitional: recent weight change [  ]; anorexia [  ]; fatigue [  ]; nausea [x  ]; night sweats [  ]; fever [  ]; or chills [  ];                                                                      Dental: poor dentition[  ];   Eye : blurred vision [  ]; diplopia [   ]; vision changes [  ];  Amaurosis fugax[  ]; Resp: cough [  ];  wheezing[  ];  hemoptysis[  ]; shortness of breath[  ]; paroxysmal nocturnal dyspnea[  ]; dyspnea on exertion[  ]; or orthopnea[  ];  GI:  gallstones[  ], vomiting[  ];  dysphagia[  ]; melena[  ];  hematochezia [  ]; heartburn[  ];   GU: kidney stones [  ]; hematuria[  ];   dysuria [  ];  nocturia[  ];               Skin: rash [  ], swelling[  ];, hair loss[  ];  peripheral edema[  ];  or itching[  ]; Musculosketetal: myalgias[  ];  joint swelling[  ];  joint erythema[  ];  joint pain[  ];  back pain[  ];  Heme/Lymph: bruising[  ];  bleeding[   ];  anemia[  ];  Neuro: TIA[  ];  headaches[  ];  stroke[  ];  vertigo[  ];  seizures[  ];   paresthesias[  ];  difficulty walking[  ];  Psych:depression[  ]; anxiety[  ];  Endocrine: diabetes[  ];  thyroid dysfunction[  ];  Other:  Past Medical History  Diagnosis Date  . Ischemic cardiomyopathy     a. 06/2012 EF 25% by echo.  . Hypertension   . Hyperlipidemia   . GERD (gastroesophageal reflux disease)   . CAD (  coronary artery disease)     a. 1991 s/p CABGx4;  b. redo CABG in 2007 (SVG->OM, VG->PDA->PLV);  c. 01/2011 Cath: LM 80ost, LAD 100p, LCX 100ost, OM1 100, RCA 100, LIMA->LAD ok, VG->PDA ok, VG->OM ok;  d. 03/2012 MV: EF 32% inf & inflat scar w/o ischemia; e. Lexi MV: EF 33%, inf infarct, no ischemia. f. lexiscan 06/12/2014 RCA scar, no ischemia  . Atrial flutter     a. 01/2011 s/p unsuccessful RFCA complicated by CHB req PPM.  . CHB (complete heart block)     a. 01/2011: in setting of RFCA, s/p SJM 2110 [employer] DC PPM, ser # [id number].  [name] Kitchen Kidney cysts     a. bilateral  . Hiatal hernia     a. 03/01/2013 EGD and esoph dil.  [name] Kitchen BPH (benign prostatic hyperplasia)     a. elevated PSA  . A-fib     a. chronic coumadin  . Elevated PSA, less than 10 ng/ml   . Fibromyalgia   . Skin cancer     'above left eyebrow; tip of my nose" (08/29/2012)  . Depression     "since 1986-1987" (08/29/2012)  . Anxiety   . Panic attacks   . Chronic systolic CHF (congestive heart failure), NYHA class 2     a. 06/2012 Echo: EF 25%, distal sept and apical AK, sev dil LV, mild LVH, mod dil LA.  [name] Kitchen Pacemaker     No current facility-administered medications on file prior to encounter.   Current Outpatient Prescriptions on File Prior to Encounter  Medication Sig Dispense Refill  . acetaminophen (TYLENOL) 500 MG tablet Take 500 mg by mouth every 8 (eight) hours as needed.    . ALPRAZolam (XANAX) 1 MG tablet Take 1 tablet (1 mg total) by mouth 3 (three) times daily as needed. For anxiety 270 tablet 1  .  atorvastatin (LIPITOR) 40 MG tablet Take 0.5-1 tablets (20-40 mg total) by mouth every other day. TAKE 1 BY MOUTH ALTERNATING WITH 1/2   TABLET EVERY OTHER DAY AS DIRECTED (Patient taking differently: Take 80 mg by mouth daily. ) 135 tablet 3  . doxepin (SINEQUAN) 25 MG capsule 1 capsule every morning and 2 capsules at night (Patient taking differently: Take 25 mg by mouth 2 (two) times daily. 1 capsule every morning and 2 capsules at night) 270 capsule 3  . furosemide (LASIX) 20 MG tablet One tablet by mouth daily as needed for swelling 30 tablet 5  . gabapentin (NEURONTIN) 100 MG capsule TAKE 1 BY MOUTH 4 TIMES DAILY 360 capsule 2  . isosorbide mononitrate (IMDUR) 60 MG 24 hr tablet TAKE 1 BY MOUTH DAILY 90 tablet 3  . JANTOVEN 5 MG tablet Take 1 tablet (5 mg total) by mouth daily. 30 tablet 1  . lisinopril (PRINIVIL,ZESTRIL) 5 MG tablet 1 tablet by mouth every other day or as directed (Patient taking differently: Take 5 mg by mouth daily. ) 90 tablet 3  . Multiple Vitamin (MULTIVITAMIN) tablet Take 1 tablet by mouth daily.      . nitroGLYCERIN (NITROSTAT) 0.4 MG SL tablet Place 1 tablet (0.4 mg total) under the tongue every 5 (five) minutes as needed. For chest pain 100 tablet prn  . omeprazole (PRILOSEC) 20 MG capsule Take 1 capsule (20 mg total) by mouth 2 (two) times daily. 180 capsule 3  . polyethylene glycol (MIRALAX / GLYCOLAX) packet Take 17 g by mouth daily.     . carvedilol (COREG) 6.25 MG tablet TAKE 1 BY MOUTH TWICE  DAILY (Patient not taking: Reported on 11/28/2014) 180 tablet 3  . fluconazole (DIFLUCAN) 150 MG tablet Take 1 tablet (150 mg total) by mouth daily. (Patient not taking: Reported on 11/28/2014) 1 tablet 0  . fluticasone (FLONASE) 50 MCG/ACT nasal spray Place 2 sprays into both nostrils daily. (Patient not taking: Reported on 11/28/2014) 16 g 6  . nystatin (MYCOSTATIN) 100000 UNIT/ML suspension Take 5 mLs (500,000 Units total) by mouth 4 (four) times daily. X 7 days. Then once  daily (Patient not taking: Reported on 11/28/2014) 473 mL 3      Allergies  Allergen Reactions  . Sulfa Drugs Cross Reactors Other (See Comments)    unknown  . Ultram [Tramadol Hcl] Itching    "don't remember how bad"  . Xarelto [Rivaroxaban] Other (See Comments)    Itching     History   Social History  . Marital Status: Widowed    Spouse Name: N/A  . Number of Children: 2  . Years of Education: 12   Occupational History  . Not on file.   Social History Main Topics  . Smoking status: Former Smoker -- 0.50 packs/day for 10 years    Types: Cigarettes    Quit date: 09/21/1973  . Smokeless tobacco: Never Used  . Alcohol Use: No  . Drug Use: No  . Sexual Activity: No   Other Topics Concern  . Not on file   Social History Narrative   Wife passed away in Sep 17, 2011. Lives in [place] with his daughter and her family.  Retired from [employer].    Family History  Problem Relation Age of Onset  . Stroke Mother   . Heart attack Father     died @ 39, mult MI's.  [name] Kitchen Heart disease Father   . Hypertension Father   . Diabetes Sister     PHYSICAL EXAM: Filed Vitals:   11/28/14 0215  BP: 142/78  Pulse: 66  Temp:   Resp: 13   General:  Well appearing. No respiratory difficulty. HEENT: PERRL, EOMI, anicteric, prior lens surgery Neck: supple. Markedly elevated JVP. Carotids 2+ bilat; no bruits. No lymphadenopathy or thryomegaly appreciated. Cor: PMI nondisplaced. Regular rate & rhythm. No rubs, gallops, 2/6 systolic murmur. Lungs: clear Abdomen: soft, nontender, nondistended. No hepatosplenomegaly. No bruits or masses. Good bowel sounds. Extremities: no cyanosis, clubbing, rash, edema Neuro: alert & oriented x 3, cranial nerves grossly intact. moves all 4 extremities w/o difficulty. Affect pleasant.  ECG: V demand pacer  Results for orders placed or performed during the hospital encounter of 11/27/14 (from the past 24 hour(s))  Basic metabolic panel     Status:  Abnormal   Collection Time: 11/27/14 10:00 PM  Result Value Ref Range   Sodium 129 (L) 135 - 145 mmol/L   Potassium 4.7 3.5 - 5.1 mmol/L   Chloride 94 (L) 101 - 111 mmol/L   CO2 30 22 - 32 mmol/L   Glucose, Bld 99 65 - 99 mg/dL   BUN 13 6 - 20 mg/dL   Creatinine, Ser 1.10 0.61 - 1.24 mg/dL   Calcium 8.8 (L) 8.9 - 10.3 mg/dL   GFR calc non Af Amer >60 >60 mL/min   GFR calc Af Amer >60 >60 mL/min   Anion gap 5 5 - 15  CBC     Status: None   Collection Time: 11/27/14 10:00 PM  Result Value Ref Range   WBC 6.1 4.0 - 10.5 K/uL   RBC 4.30 4.22 - 5.81 MIL/uL   Hemoglobin  13.3 13.0 - 17.0 g/dL   HCT 39.2 39.0 - 52.0 %   MCV 91.2 78.0 - 100.0 fL   MCH 30.9 26.0 - 34.0 pg   MCHC 33.9 30.0 - 36.0 g/dL   RDW 13.6 11.5 - 15.5 %   Platelets 155 150 - 400 K/uL  Troponin I     Status: None   Collection Time: 11/28/14 12:08 AM  Result Value Ref Range   Troponin I <0.03 <0.031 ng/mL  Protime-INR     Status: Abnormal   Collection Time: 11/28/14 12:08 AM  Result Value Ref Range   Prothrombin Time 20.9 (H) 11.6 - 15.2 seconds   INR 1.81 (H) 0.00 - 1.49   Dg Chest 2 View  11/27/2014   CLINICAL DATA:  Chest pain beginning at 1430 hours, with nausea. Elbow pain. History of hypertension, ischemic cardiomyopathy, coronary artery disease.  EXAM: CHEST  2 VIEW  COMPARISON:  Chest radiograph June 11, 2014  FINDINGS: The cardiac silhouette is upper limits of normal in size and unchanged. 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 cardiac pacemaker in situ. Strandy densities LEFT lung base, with mildly elevated LEFT hemidiaphragm is similar. No pleural effusion or focal consolidation. No pneumothorax. Surgical clips in the included right abdomen compatible with cholecystectomy. Patient is mildly osteopenic.  IMPRESSION: Stable borderline cardiomegaly, LEFT lung base atelectasis/ scarring.   Electronically Signed   By: Elon Alas M.D.   On: 11/27/2014 22:35     ASSESSMENT: 74 yo man  with CAD s/p CABG x 2, ICM with EF 30% most recently, AFL and HB s/p DC PPM presenting with CP.  His symptoms are concerning for angina, especially with history.  However, he had 1 episode earlier today that lasted about an hour followed by a second episode that did not resolve and troponin drawn with ongoing pain about 5 hours into symptoms and was undetectable making ischemia highly unlikely.  He does have significantly elevated JVP and reports sleeping in a recliner due to orthopnea consisted with chronic systolic CHF.  PLAN/DISCUSSION: Admit for observation, tele Continue heparin while cycling enzymes, INR also subtherapeutic Cycle troponins Risk stratify Diurese with lasix 40 mg IV x 1, will likely need to repeat given degree of JVP noted Continue ACE and BB for ICM Continue statin for CAD and HLD Suspect Troponin will remain negative, if so, consider discharge and OP follow up

## 2014-11-28 NOTE — ED Notes (Signed)
Cardiology MD at bedside.

## 2014-11-28 NOTE — Progress Notes (Signed)
Patient Name: [name] Date of Encounter: 11/28/2014  Principal Problem:   Chest pain Active Problems:   Ischemic cardiomyopathy   Hypertension   Hyperlipidemia   Atrial fibrillation-Permanent   Primary Cardiologist: Dr Mare Ferrari  Patient Profile: 74 yo male w/ hx CABG '91 & '07, EF 25%, AFL, CHB w/ PPM, cath 2012 w/ patent grafts, admitted early 07/29 w/ chest pain.  SUBJECTIVE: No more chest pain. Stuffy nose, slight nausea (he thinks since he hasn't eaten), no chest pain, a little SOB but no recent LE edema. On Xanax at home, needs.  OBJECTIVE Filed Vitals:   11/28/14 0215 11/28/14 0230 11/28/14 0245 11/28/14 0326  BP: 142/78 138/65 126/78 149/76  Pulse: 66 60 68 64  Temp:    97.8 F (36.6 C)  TempSrc:      Resp: 13  11 18   Height:    5\' 11"  (1.803 m)  Weight:    187 lb 9.6 oz (85.095 kg)  SpO2: 98% 99% 94% 94%    Intake/Output Summary (Last 24 hours) at 11/28/14 0810 Last data filed at 11/28/14 0534  Gross per 24 hour  Intake      0 ml  Output    975 ml  Net   -975 ml   Filed Weights   11/28/14 0326  Weight: 187 lb 9.6 oz (85.095 kg)    PHYSICAL EXAM General: Well developed, well nourished, male in no acute distress. Head: Normocephalic, atraumatic.  Neck: Supple without bruits, JVD 9 cm. Lungs:  Resp regular and unlabored, rales bases. + hepatojugular reflux Heart: RRR, S1, S2, no S3, S4, 2-3/6 murmur; no rub. Abdomen: Soft, non-tender, non-distended, BS + x 4.  Extremities: No clubbing, cyanosis, edema.  Neuro: Alert and oriented X 3. Moves all extremities spontaneously. Psych: Normal affect.  LABS: CBC: Recent Labs  11/27/14 2200  WBC 6.1  HGB 13.3  HCT 39.2  MCV 91.2  PLT 155   INR: Recent Labs  11/28/14 0008  INR 2.26*   Basic Metabolic Panel: Recent Labs  11/27/14 2200 11/28/14 0459  NA 129* 133*  K 4.7 4.4  CL 94* 97*  CO2 30 30  GLUCOSE 99 98  BUN 13 12  CREATININE 1.10 1.05  CALCIUM 8.8* 9.1   Liver  Function Tests:No results for input(s): AST, ALT, ALKPHOS, BILITOT, PROT, ALBUMIN in the last 72 hours. Cardiac Enzymes: Recent Labs  11/28/14 0008 11/28/14 0459  TROPONINI <0.03 <0.03   Fasting Lipid Panel: Recent Labs  11/28/14 0459  CHOL 120  HDL 46  LDLCALC 68  TRIG 29  CHOLHDL 2.6   Thyroid Function Tests: Recent Labs  11/28/14 0459  TSH 5.469*   TELE:        ECG:   Radiology/Studies: Dg Chest 2 View  11/27/2014   CLINICAL DATA:  Chest pain beginning at 1430 hours, with nausea. Elbow pain. History of hypertension, ischemic cardiomyopathy, coronary artery disease.  EXAM: CHEST  2 VIEW  COMPARISON:  Chest radiograph June 11, 2014  FINDINGS: The cardiac silhouette is upper limits of normal in size and unchanged. 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 cardiac pacemaker in situ. Strandy densities LEFT lung base, with mildly elevated LEFT hemidiaphragm is similar. No pleural effusion or focal consolidation. No pneumothorax. Surgical clips in the included right abdomen compatible with cholecystectomy. Patient is mildly osteopenic.  IMPRESSION: Stable borderline cardiomegaly, LEFT lung base atelectasis/ scarring.   Electronically Signed   By: Thana Farr.D.  On: 11/27/2014 22:35     Current Medications:  . aspirin  324 mg Oral Once  . [START ON 11/29/2014] aspirin EC  81 mg Oral Daily  . atorvastatin  80 mg Oral q1800  . carvedilol  12.5 mg Oral BID WC  . doxepin  25 mg Oral BID  . gabapentin  100 mg Oral QID  . lisinopril  5 mg Oral Daily  . loratadine  10 mg Oral Daily  . pantoprazole  40 mg Oral Daily   . heparin 950 Units/hr (11/28/14 0110)  . nitroGLYCERIN 20 mcg/min (11/28/14 0222)    ASSESSMENT AND PLAN: Principal Problem:   Chest pain - ez neg so far, doubt cath needed - next troponin at 9:30 am  Active Problems:   Ischemic cardiomyopathy, hx diastolic CHF - had Lasix 40 mg in the ER - weight is at least 5 lbs up, will give 1  more dose IV Lasix 40 mg - needs to track his weight at home. - K+ and renal function OK    Hypertension - SBP  120s-140s on current rx    Hyperlipidemia - profile OK, will ck LFTs    Atrial fibrillation-Permanent - pacing    Chronic anticoagulation - coumadin - INR 1.81 on admit - on heparin  Plan - get another troponin, give IV Lasix, possible d/c later if does well w/ ambulation vs in pt MV later today.  Signed, Barrett, Rhonda , PA-C 8:10 AM 11/28/2014   I have examined the patient and reviewed assessment and plan and discussed with patient.  Agree with above as stated.  He feels much better.  He has lost a lot of fluid by his report.  No oxygen requirement.  Would plan discharge later today.  Stop hepain and IV NTG.  Appears euvolemic.  VARANASI,JAYADEEP S.

## 2014-11-28 NOTE — Discharge Summary (Signed)
CARDIOLOGY DISCHARGE SUMMARY   Patient ID: [name] MRN: [id number] DOB/AGE: 1941-02-23 74 y.o.  Admit date: 11/27/2014 Discharge date: 11/28/2014  PCP: Brett Backbone, PA-C Primary Cardiologist: Dr [name]  Primary Discharge Diagnosis:  Chest pain with moderate risk for cardiac etiology Secondary Discharge Diagnosis:    Ischemic cardiomyopathy   Hypertension   Hyperlipidemia   Atrial fibrillation-Permanent  Procedures: 2 view CXR  Hospital Course: [name] is a 74 y.o. male with a history of CABG '91 & '07, EF 25%, AFib, CHB w/ PPM, anxiety, cath 2012 w/ patent grafts, Myoview 06/2014 with no ischemia and EF 26%.   He had chest pain and came to the hospital, where he was admitted for further evaluation and treatment.  His cardiac enzymes were negative for MI. His chest pain resolved with IV nitroglycerin, and morphine. The IV nitroglycerin was discontinued and his chest pain did not return. He was started on heparin because his INR was subtherapeutic.  His last weight in the office on 11/05/2014 was 174 pounds and his weight on admission was 187 pounds. He had some volume overload by exam, and was given 2 doses of IV Lasix with improvement.   On 11/28/2014, he was seen by Dr. Beau [name] and all data were reviewed. Because his symptoms had resolved, and a stress test less than 6 months ago was without ischemia, no further workup was indicated. [name] is considered stable for discharge, to follow-up with Dr. Mare [name] in the office.  Labs:   Lab Results  Component Value Date   WBC 6.1 11/27/2014   HGB 13.3 11/27/2014   HCT 39.2 11/27/2014   MCV 91.2 11/27/2014   PLT 155 11/27/2014     Recent Labs Lab 11/28/14 0459  NA 133*  K 4.4  CL 97*  CO2 30  BUN 12  CREATININE 1.05  CALCIUM 9.1  GLUCOSE 98    Recent Labs  11/28/14 0008 11/28/14 0459 11/28/14 0834  TROPONINI <0.03 <0.03 <0.03   Lipid Panel     Component Value Date/Time   CHOL  120 11/28/2014 0459   TRIG 29 11/28/2014 0459   HDL 46 11/28/2014 0459   CHOLHDL 2.6 11/28/2014 0459   VLDL 6 11/28/2014 0459   LDLCALC 68 11/28/2014 0459   LDLDIRECT 77.3 12/20/2010 0922    Recent Labs  11/28/14 0008  INR 1.81*      Radiology: Dg Chest 2 View 11/27/2014   CLINICAL DATA:  Chest pain beginning at 1430 hours, with nausea. Elbow pain. History of hypertension, ischemic cardiomyopathy, coronary artery disease.  EXAM: CHEST  2 VIEW  COMPARISON:  Chest radiograph June 11, 2014  FINDINGS: The cardiac silhouette is upper limits of normal in size and unchanged. 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 cardiac pacemaker in situ. Strandy densities LEFT lung base, with mildly elevated LEFT hemidiaphragm is similar. No pleural effusion or focal consolidation. No pneumothorax. Surgical clips in the included right abdomen compatible with cholecystectomy. Patient is mildly osteopenic.  IMPRESSION: Stable borderline cardiomegaly, LEFT lung base atelectasis/ scarring.   Electronically Signed   By: [name] M.D.   On: 11/27/2014 22:35   EKG: afib, V pacing  FOLLOW UP PLANS AND APPOINTMENTS Allergies  Allergen Reactions  . Sulfa Drugs Cross Reactors Other (See Comments)    unknown  . Ultram [Tramadol Hcl] Itching    "don't remember how bad"  . Xarelto [Rivaroxaban] Other (See Comments)    Itching      Medication  List    TAKE these medications        acetaminophen 500 MG tablet  Commonly known as:  TYLENOL  Take 500 mg by mouth every 8 (eight) hours as needed.     ALPRAZolam 1 MG tablet  Commonly known as:  XANAX  Take 1 tablet (1 mg total) by mouth 3 (three) times daily as needed. For anxiety     atorvastatin 40 MG tablet  Commonly known as:  LIPITOR  Take 0.5-1 tablets (20-40 mg total) by mouth every other day. TAKE 1 BY MOUTH ALTERNATING WITH 1/2   TABLET EVERY OTHER DAY AS DIRECTED     carvedilol 12.5 MG tablet  Commonly known as:  COREG  Take  12.5 mg by mouth 2 (two) times daily with a meal.     doxepin 25 MG capsule  Commonly known as:  SINEQUAN  1 capsule every morning and 2 capsules at night     fluconazole 150 MG tablet  Commonly known as:  DIFLUCAN  Take 1 tablet (150 mg total) by mouth daily.     fluticasone 50 MCG/ACT nasal spray  Commonly known as:  FLONASE  Place 2 sprays into both nostrils daily.     furosemide 20 MG tablet  Commonly known as:  LASIX  One tablet by mouth daily as needed for swelling     gabapentin 100 MG capsule  Commonly known as:  NEURONTIN  TAKE 1 BY MOUTH 4 TIMES DAILY     isosorbide mononitrate 60 MG 24 hr tablet  Commonly known as:  IMDUR  TAKE 1 BY MOUTH DAILY     warfarin 2.5 MG tablet  Commonly known as:  COUMADIN  Take 5 mg by mouth daily. 2.5 mg on Wed/Sat, 5 mg all other days     JANTOVEN 5 MG tablet  Generic drug:  warfarin  Take 1 tablet (5 mg total) by mouth daily.     lisinopril 5 MG tablet  Commonly known as:  PRINIVIL,ZESTRIL  1 tablet by mouth every other day or as directed     loratadine 10 MG tablet  Commonly known as:  CLARITIN  Take 10 mg by mouth daily.     multivitamin tablet  Take 1 tablet by mouth daily.     nitroGLYCERIN 0.4 MG SL tablet  Commonly known as:  NITROSTAT  Place 1 tablet (0.4 mg total) under the tongue every 5 (five) minutes as needed. For chest pain     nystatin 100000 UNIT/ML suspension  Commonly known as:  MYCOSTATIN  Take 5 mLs (500,000 Units total) by mouth 4 (four) times daily. X 7 days. Then once daily     omeprazole 20 MG capsule  Commonly known as:  PRILOSEC  Take 1 capsule (20 mg total) by mouth 2 (two) times daily.     polyethylene glycol packet  Commonly known as:  MIRALAX / GLYCOLAX  Take 17 g by mouth daily.        Discharge Instructions    (HEART FAILURE PATIENTS) Call MD:  Anytime you have any of the following symptoms: 1) 3 pound weight gain in 24 hours or 5 pounds in 1 week 2) shortness of breath, with or  without a dry hacking cough 3) swelling in the hands, feet or stomach 4) if you have to sleep on extra pillows at night in order to breathe.    Complete by:  As directed      Diet - low sodium heart healthy  Complete by:  As directed      Increase activity slowly    Complete by:  As directed           Follow-up Information    Follow up with Warren Danes, MD.   Specialty:  Cardiology   Why:  The office will call.    Contact information:   [address] [place] [address] [phone]       BRING ALL MEDICATIONS WITH YOU TO FOLLOW UP APPOINTMENTS  Time spent with patient to include physician time: 45 min Signed: Rosaria Ferries, PA-C 11/28/2014, 1:37 PM Co-Sign MD   I have examined the patient and reviewed assessment and plan and discussed with patient.  Agree with above as stated.  DOing well.  Wants to go home.  DIuresed well.  See my note from earlier today.  Continue medical Rx for systolic dysfunction.  Catelyn Friel S.

## 2014-11-28 NOTE — Progress Notes (Signed)
Wink for Heparin Indication: r/o ACS, afib  Allergies  Allergen Reactions  . Sulfa Drugs Cross Reactors Other (See Comments)    unknown  . Ultram [Tramadol Hcl] Itching    "don't remember how bad"  . Xarelto [Rivaroxaban] Other (See Comments)    Itching     Patient Measurements: Height: 5\' 11"  (180.3 cm) Weight: 187 lb 9.6 oz (Brett.095 kg) IBW/kg (Calculated) : 75.3  Ht:  71 in  Wt: 79 kg Heparin Dosing Weight: 79 kg  Vital Signs: Temp: 97.8 F (36.6 C) (07/29 0326) BP: 149/76 mmHg (07/29 0326) Pulse Rate: 64 (07/29 0326)  Labs:  Recent Labs  11/27/14 2200 11/28/14 0008 11/28/14 0459 11/28/14 0834  HGB 13.3  --   --   --   HCT 39.2  --   --   --   PLT 155  --   --   --   LABPROT  --  20.9*  --   --   INR  --  1.81*  --   --   HEPARINUNFRC  --   --   --  0.26*  CREATININE 1.10  --  1.05  --   TROPONINI  --  <0.03 <0.03 <0.03    Estimated Creatinine Clearance: 65.7 mL/min (by C-G formula based on Cr of 1.05).   Medical History: Past Medical History  Diagnosis Date  . Ischemic cardiomyopathy     a. 06/2012 EF 25% by echo.  . Hypertension   . Hyperlipidemia   . GERD (gastroesophageal reflux disease)   . CAD (coronary artery disease)     a. 1991 s/p CABGx4;  b. redo CABG in 2007 (SVG->OM, VG->PDA->PLV);  c. 01/2011 Cath: LM 80ost, LAD 100p, LCX 100ost, OM1 100, RCA 100, LIMA->LAD ok, VG->PDA ok, VG->OM ok;  d. 03/2012 MV: EF 32% inf & inflat scar w/o ischemia; e. Lexi MV: EF 33%, inf infarct, no ischemia. f. lexiscan 06/12/2014 RCA scar, no ischemia  . Atrial flutter     a. 01/2011 s/p unsuccessful RFCA complicated by CHB req PPM.  . CHB (complete heart block)     a. 01/2011: in setting of RFCA, s/p SJM 2110 [employer] DC PPM, ser # [id number].  [name] Kitchen Kidney cysts     a. bilateral  . Hiatal hernia     a. 03/01/2013 EGD and esoph dil.  [name] Kitchen BPH (benign prostatic hyperplasia)     a. elevated PSA  . A-fib     a. chronic coumadin  .  Elevated PSA, less than 10 ng/ml   . Fibromyalgia   . Skin cancer     'above left eyebrow; tip of my nose" (08/29/2012)  . Depression     "since 1986-1987" (08/29/2012)  . Anxiety   . Panic attacks   . Chronic systolic CHF (congestive heart failure), NYHA class 2     a. 06/2012 Echo: EF 25%, distal sept and apical AK, sev dil LV, mild LVH, mod dil LA.  [name] Kitchen Pacemaker     Medications:  See electronic med rec  Assessment: 74 y.o. [name] presents with CP. Pt on coumadin PTA for afib. Admit INR 1.81. To begin heparin for r/o ACS and holding coumadin for now. CBC stable at baseline. Initial HL subtherapeutic.  PTA warfarin: 2.5 mg Wed/Sat, 5 mg all other days  Goal of Therapy:  Heparin level 0.3-0.7 units/ml Monitor platelets by anticoagulation protocol: Yes   Plan:  Increase heparin to 1100 units/hr  Daily heparin level and CBC  Hughes Better, PharmD, BCPS Clinical Pharmacist Pager: [phone] 11/28/2014 9:45 AM

## 2014-11-28 NOTE — Progress Notes (Signed)
ANTICOAGULATION CONSULT NOTE - Initial Consult  Pharmacy Consult for Heparin Indication: r/o ACS, afib  Allergies  Allergen Reactions  . Sulfa Drugs Cross Reactors Other (See Comments)    unknown  . Ultram [Tramadol Hcl] Itching    "don't remember how bad"  . Xarelto [Rivaroxaban] Other (See Comments)    Itching     Patient Measurements:    Ht:  71 in  Wt: 79 kg Heparin Dosing Weight: 79 kg  Vital Signs: Temp: 98.2 F (36.8 C) (07/28 2136) Temp Source: Oral (07/28 2136) BP: 140/88 mmHg (07/29 0006) Pulse Rate: 61 (07/29 0006)  Labs:  Recent Labs  11/27/14 2200 11/28/14 0008  HGB 13.3  --   HCT 39.2  --   PLT 155  --   LABPROT  --  20.9*  INR  --  1.81*  CREATININE 1.10  --     CrCl cannot be calculated (Unknown ideal weight.).   Medical History: Past Medical History  Diagnosis Date  . Ischemic cardiomyopathy     a. 06/2012 EF 25% by echo.  . Hypertension   . Hyperlipidemia   . GERD (gastroesophageal reflux disease)   . CAD (coronary artery disease)     a. 1991 s/p CABGx4;  b. redo CABG in 2007 (SVG->OM, VG->PDA->PLV);  c. 01/2011 Cath: LM 80ost, LAD 100p, LCX 100ost, OM1 100, RCA 100, LIMA->LAD ok, VG->PDA ok, VG->OM ok;  d. 03/2012 MV: EF 32% inf & inflat scar w/o ischemia; e. Lexi MV: EF 33%, inf infarct, no ischemia. f. lexiscan 06/12/2014 RCA scar, no ischemia  . Atrial flutter     a. 01/2011 s/p unsuccessful RFCA complicated by CHB req PPM.  . CHB (complete heart block)     a. 01/2011: in setting of RFCA, s/p SJM 2110 [employer] DC PPM, ser # [id number].  [name] Kitchen Kidney cysts     a. bilateral  . Hiatal hernia     a. 03/01/2013 EGD and esoph dil.  [name] Kitchen BPH (benign prostatic hyperplasia)     a. elevated PSA  . A-fib     a. chronic coumadin  . Elevated PSA, less than 10 ng/ml   . Fibromyalgia   . Skin cancer     'above left eyebrow; tip of my nose" (08/29/2012)  . Depression     "since 1986-1987" (08/29/2012)  . Anxiety   . Panic attacks   . Chronic systolic CHF  (congestive heart failure), NYHA class 2     a. 06/2012 Echo: EF 25%, distal sept and apical AK, sev dil LV, mild LVH, mod dil LA.  [name] Kitchen Pacemaker     Medications:  See electronic med rec  Assessment: 74 y.o. male presents with CP. Pt on coumadin PTA for afib. Admit INR 1.81. To begin heparin for r/o ACS and holding coumadin for now. CBC stable at baseline.  Goal of Therapy:  Heparin level 0.3-0.7 units/ml Monitor platelets by anticoagulation protocol: Yes   Plan:  Heparin gtt at 950 units/hr. No bolus. Will f/u 8 hour heparin level Daily heparin level and CBC  Sherlon Handing, PharmD, BCPS Clinical pharmacist, pager [phone] 11/28/2014,12:40 AM

## 2014-11-29 LAB — HEMOGLOBIN A1C
HEMOGLOBIN A1C: 5.9 % — AB (ref 4.8–5.6)
Mean Plasma Glucose: 123 mg/dL

## 2014-12-02 ENCOUNTER — Ambulatory Visit (INDEPENDENT_AMBULATORY_CARE_PROVIDER_SITE_OTHER): Payer: Medicare Other | Admitting: Pharmacist

## 2014-12-02 DIAGNOSIS — I482 Chronic atrial fibrillation, unspecified: Secondary | ICD-10-CM

## 2014-12-02 LAB — POCT INR: INR: 1.8

## 2014-12-02 NOTE — Patient Instructions (Signed)
Anticoagulation Dose Instructions as of 12/02/2014      [name] Tue Wed Thu Fri Sat   New Dose 5 mg 5 mg 5 mg 5 mg 5 mg 5 mg 2.5 mg    Description        Increase dose to Jantoven 5mg  - take 1/2 tablet on saturdays and 1 tablet all other days.     INR was 1.8 today

## 2014-12-08 ENCOUNTER — Telehealth: Payer: Self-pay | Admitting: Cardiology

## 2014-12-08 NOTE — Telephone Encounter (Signed)
New message      Talk to [name].  Pt was in the hosp a couple of weeks ago.  He want to talk to you about his hosp stay and get you to explain to him about his hosp stay

## 2014-12-08 NOTE — Telephone Encounter (Signed)
Patient has follow up scheduled with  Dr. Mare Ferrari  He would like  Dr. Mare Ferrari to review his recent hospitalization

## 2014-12-10 NOTE — Telephone Encounter (Signed)
I reviewed his recent admission. There was no significant change in his status. CSD.

## 2014-12-10 NOTE — Telephone Encounter (Signed)
Advised patient

## 2014-12-16 ENCOUNTER — Ambulatory Visit (INDEPENDENT_AMBULATORY_CARE_PROVIDER_SITE_OTHER): Payer: Medicare Other | Admitting: Pharmacist Clinician (PhC)/ Clinical Pharmacy Specialist

## 2014-12-16 DIAGNOSIS — I482 Chronic atrial fibrillation, unspecified: Secondary | ICD-10-CM

## 2014-12-16 LAB — POCT INR: INR: 1.8

## 2014-12-16 NOTE — Patient Instructions (Signed)
Anticoagulation Dose Instructions as of 12/16/2014      Dorene Grebe Tue Wed Thu Fri Sat   New Dose 5 mg 5 mg 5 mg 5 mg 5 mg 5 mg 5 mg    Description        Increase dose to Jantoven 5mg  every day and take 1 1/2 tablets today only

## 2014-12-23 ENCOUNTER — Telehealth: Payer: Self-pay | Admitting: Cardiology

## 2014-12-23 NOTE — Telephone Encounter (Signed)
Patient will just keep appointment on Thursday and discuss with him Has pain in arms today now, thinks fibromyalgia

## 2014-12-23 NOTE — Telephone Encounter (Signed)
New message     Pt having problem with pain in legs from knee down and feet feel cool Please call after 2pm

## 2014-12-25 ENCOUNTER — Encounter: Payer: Self-pay | Admitting: Cardiology

## 2014-12-25 ENCOUNTER — Ambulatory Visit (INDEPENDENT_AMBULATORY_CARE_PROVIDER_SITE_OTHER): Payer: Medicare Other | Admitting: Cardiology

## 2014-12-25 ENCOUNTER — Other Ambulatory Visit (INDEPENDENT_AMBULATORY_CARE_PROVIDER_SITE_OTHER): Payer: Medicare Other | Admitting: *Deleted

## 2014-12-25 VITALS — BP 128/70 | HR 61 | Ht 71.0 in | Wt 179.0 lb

## 2014-12-25 DIAGNOSIS — F419 Anxiety disorder, unspecified: Secondary | ICD-10-CM | POA: Diagnosis not present

## 2014-12-25 DIAGNOSIS — I10 Essential (primary) hypertension: Secondary | ICD-10-CM | POA: Diagnosis not present

## 2014-12-25 DIAGNOSIS — I442 Atrioventricular block, complete: Secondary | ICD-10-CM

## 2014-12-25 DIAGNOSIS — E785 Hyperlipidemia, unspecified: Secondary | ICD-10-CM

## 2014-12-25 DIAGNOSIS — I259 Chronic ischemic heart disease, unspecified: Secondary | ICD-10-CM | POA: Diagnosis not present

## 2014-12-25 LAB — LIPID PANEL
CHOL/HDL RATIO: 3
Cholesterol: 113 mg/dL (ref 0–200)
HDL: 44.3 mg/dL (ref >39.00)
LDL Cholesterol: 57 mg/dL (ref 0–99)
NonHDL: 69.15
Triglycerides: 59 mg/dL (ref 0.0–149.0)
VLDL: 11.8 mg/dL (ref 0.0–40.0)

## 2014-12-25 LAB — HEPATIC FUNCTION PANEL
ALT: 21 U/L (ref 0–53)
AST: 22 U/L (ref 0–37)
Albumin: 4.2 g/dL (ref 3.5–5.2)
Alkaline Phosphatase: 80 U/L (ref 39–117)
BILIRUBIN TOTAL: 1.3 mg/dL — AB (ref 0.2–1.2)
Bilirubin, Direct: 0.3 mg/dL (ref 0.0–0.3)
Total Protein: 6.9 g/dL (ref 6.0–8.3)

## 2014-12-25 LAB — BASIC METABOLIC PANEL
BUN: 18 mg/dL (ref 6–23)
CO2: 31 mEq/L (ref 19–32)
CREATININE: 1.14 mg/dL (ref 0.40–1.50)
Calcium: 9.3 mg/dL (ref 8.4–10.5)
Chloride: 96 mEq/L (ref 96–112)
GFR: 66.68 mL/min (ref >60.00)
Glucose, Bld: 83 mg/dL (ref 70–99)
Potassium: 4.8 mEq/L (ref 3.5–5.1)
Sodium: 131 mEq/L — ABNORMAL LOW (ref 135–145)

## 2014-12-25 MED ORDER — GABAPENTIN 100 MG PO CAPS: ORAL_CAPSULE | ORAL | Status: DC

## 2014-12-25 MED ORDER — ROSUVASTATIN CALCIUM 5 MG PO TABS: 5.0000 mg | ORAL_TABLET | Freq: Every day | ORAL | Status: DC

## 2014-12-25 MED ORDER — DOXEPIN HCL 25 MG PO CAPS
ORAL_CAPSULE | ORAL | Status: DC
Start: 2014-12-25 — End: 2015-03-25

## 2014-12-25 NOTE — Progress Notes (Signed)
Cardiology Office Note   Date:  12/25/2014   ID:  [name], DOB 04-Jan-1941, MRN [id number]  PCP:  Marline Backbone, PA-C  Cardiologist: Darlin Coco MD  Chief Complaint  Patient presents with  . Leg Pain      History of Present Illness: [name] is a 74 y.o. male who presents for scheduled 3 month follow-up office visit.  t. He has a history of significant ischemic heart disease. He has a history of a remote CABG and had redo CABG in 2007. He was admitted to [facility] on 03/09/12 and discharged on 03/11/12. He was admitted after he complained of having some chest discomfort at the time of a routine pacemaker check up. He was found to be in atrial fibrillation at the time of admission. His most recent Lexiscan Myoview was 06/12/14 which showed an ejection fraction of 26% with akinesis of the inferior and inferolateral walls but no ischemia. His last echocardiogram in August 2014 showed an ejection fraction of 25-30%. 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 His last cardiac catheterization in October 2012 showed that his grafts were patent. The patient also has a history of hypercholesterolemia and essential hypertension. He has had a prior history of paroxysmal atrial flutter with ablation therapy and subsequent heart block which required a [employer] pacemaker which was implanted in 2012. The patient is on long-term Coumadin. He has a lot of anxiety. On 05/25/12 the patient underwent elective cardioversion which was successful. Normal sinus rhythm and was returned as was confirmed by intracardiac electrogram via his pacemaker. However, within 24 hours after the cardioversion he felt that his pulse was irregular again. EKG confirmed that he was back in atrial flutter fibrillation. He was essentially asymptomatic and states that the arrhythmia doesn't really bother him much. Strategy is rate control and long-term anticoagulation. He is intolerant of  Xarelto because of itching and is back on long-term Coumadin. The patient was readmitted to the hospital in November 2014 for chest pain. He underwent another nuclear stress test on 03/27/13. This showed an ejection fraction of 33% and showed an inferior wall scar but no reversible ischemia. His most recent echocardiogram on 12/13/13 showed an ejection fraction of 30% The patient reports that he has not had to take any sublingual nitroglycerin since his last office visit with [place]. He has seen Dr. Gae Gallop regarding questionable leg claudication. Dr. Scot Dock did not feel that he required any further intervention. He was encouraged to continue to walk. Previous abdominal CT scan in April 2015 showed extensive atherosclerotic involvement of the aorta and iliac branches. He comes in today for a follow-up appointment. . He has a past history of dyspepsia and is followed by gastroenterology at [facility]. Since last visit he has had a lot of sinus and allergy symptoms. He has had 2 rounds of treatment for sinusitis, with Augmentin and with a trial of erythromycin. He states he is allergic to erythromycin. His PCP has referred him to Dr. Simeon Craft, ENT physician, for tomorrow.  The patient was admitted to the hospital on 11/27/14 because of chest pain and he ruled out by enzymes. On 11/28/2014, he was seen by Dr. Irish Lack and all data were reviewed. Because his symptoms had resolved, and a stress test less than 6 months ago was without ischemia, no further workup was indicated. [name] is considered stable for discharge, to follow-up with Dr. Mare Ferrari in the office. The patient  has been experiencing a lot of aching in the muscles of his legs.  Total lesser extent his arms and shoulders.  He has been on long-term Lipitor.  His symptoms may be a side effect from the statin therapy.  We will stop Lipitor for a week and then start Crestor 5 mg daily instead if he is able to afford it.  It is now  generic which will help some.  Past Medical History  Diagnosis Date  . Ischemic cardiomyopathy     a. 06/2012 EF 25% by echo.  . Hypertension   . Hyperlipidemia   . GERD (gastroesophageal reflux disease)   . CAD (coronary artery disease)     a. 1991 s/p CABGx4;  b. redo CABG in 2007 (SVG->OM, VG->PDA->PLV);  c. 01/2011 Cath: LM 80ost, LAD 100p, LCX 100ost, OM1 100, RCA 100, LIMA->LAD ok, VG->PDA ok, VG->OM ok;  d. 03/2012 MV: EF 32% inf & inflat scar w/o ischemia; e. Lexi MV: EF 33%, inf infarct, no ischemia. f. lexiscan 06/12/2014 RCA scar, no ischemia  . Atrial flutter     a. 01/2011 s/p unsuccessful RFCA complicated by CHB req PPM.  . CHB (complete heart block)     a. 01/2011: in setting of RFCA, s/p SJM 2110 [employer] DC PPM, ser # [id number].  [name] Kitchen Kidney cysts     a. bilateral  . Hiatal hernia     a. 03/01/2013 EGD and esoph dil.  [name] Kitchen BPH (benign prostatic hyperplasia)     a. elevated PSA  . A-fib     a. chronic coumadin  . Elevated PSA, less than 10 ng/ml   . Fibromyalgia   . Skin cancer     'above left eyebrow; tip of my nose" (08/29/2012)  . Depression     "since 1986-1987" (08/29/2012)  . Anxiety   . Panic attacks   . Chronic systolic CHF (congestive heart failure), NYHA class 2     a. 06/2012 Echo: EF 25%, distal sept and apical AK, sev dil LV, mild LVH, mod dil LA.  [name] Kitchen Pacemaker     Past Surgical History  Procedure Laterality Date  . Cardiac catheterization  10/30/2009    Grafts patent. Mild to moderate LV dysfunction. EF 45%. Managed medically.   . Cardiac catheterization      multiple  . Tonsillectomy  1960  . Cholecystectomy  2007  . Insert / replace / remove pacemaker  01/2011    initial placement  . Inguinal hernia repair  1960's    "left I think"  . Cataract extraction w/ intraocular lens  implant, bilateral  2012  . Cardioversion  05/25/2012    Procedure: CARDIOVERSION;  Surgeon: Darlin Coco, MD;  Location: [facility] ENDOSCOPY;  Service: Cardiovascular;  Laterality: N/A;    . Humerus surgery Left 1960    "growth on the bone taken off; not cancer" (08/29/2012)  . Excisional hemorrhoidectomy  ~ 1980  . Skin cancer excision      'above left eyebrow; tip of my nose" (08/29/2012)  . Atrial flutter ablation  01/2011    "didn't work" (08/29/2012)  . Coronary artery bypass graft  08/27/1989    CABG X 5  . Coronary artery bypass graft  06/2005    CABG X 3     Current Outpatient Prescriptions  Medication Sig Dispense Refill  . acetaminophen (TYLENOL) 500 MG tablet Take 500 mg by mouth every 8 (eight) hours as needed for mild pain.     [name] Kitchen ALPRAZolam (XANAX) 1 MG tablet Take  1 tablet (1 mg total) by mouth 3 (three) times daily as needed. For anxiety 270 tablet 1  . carvedilol (COREG) 6.25 MG tablet Take 1 tablet by mouth 2 (two) times daily.  2  . doxepin (SINEQUAN) 25 MG capsule 1 capsule every morning and 2 capsules at night 270 capsule 3  . fluticasone (FLONASE) 50 MCG/ACT nasal spray Place 2 sprays into both nostrils daily. 16 g 6  . furosemide (LASIX) 20 MG tablet One tablet by mouth daily as needed for swelling 30 tablet 5  . gabapentin (NEURONTIN) 100 MG capsule TAKE 1 BY MOUTH 4 TIMES DAILY 360 capsule 3  . isosorbide mononitrate (IMDUR) 60 MG 24 hr tablet TAKE 1 BY MOUTH DAILY 90 tablet 3  . JANTOVEN 5 MG tablet Take 1 tablet (5 mg total) by mouth daily. 30 tablet 1  . lisinopril (PRINIVIL,ZESTRIL) 5 MG tablet 1 tablet by mouth every other day or as directed 90 tablet 3  . loratadine (CLARITIN) 10 MG tablet Take 10 mg by mouth daily.    . Multiple Vitamin (MULTIVITAMIN) tablet Take 1 tablet by mouth daily.      . Multiple Vitamins-Minerals (PRESERVISION AREDS 2) CAPS Take 1 capsule by mouth 2 (two) times daily.    . nitroGLYCERIN (NITROSTAT) 0.4 MG SL tablet Place 1 tablet (0.4 mg total) under the tongue every 5 (five) minutes as needed. For chest pain 100 tablet prn  . nystatin (MYCOSTATIN) 100000 UNIT/ML suspension Take 5 mLs (500,000 Units total) by mouth 4  (four) times daily. X 7 days. Then once daily 473 mL 3  . omeprazole (PRILOSEC) 20 MG capsule Take 1 capsule (20 mg total) by mouth 2 (two) times daily. 180 capsule 3  . polyethylene glycol (MIRALAX / GLYCOLAX) packet Take 17 g by mouth daily.     . rosuvastatin (CRESTOR) 5 MG tablet Take 1 tablet (5 mg total) by mouth daily. 30 tablet 3   No current facility-administered medications for this visit.    Allergies:   Lipitor; Sulfa drugs cross reactors; Ultram; and Xarelto    Social History:  The patient  reports that he quit smoking about 41 years ago. His smoking use included Cigarettes. He has a 5 pack-year smoking history. He has never used smokeless tobacco. He reports that he does not drink alcohol or use illicit drugs.   Family History:  The patient's family history includes Diabetes in his sister; Heart attack in his father; Heart disease in his father; Hypertension in his father; Stroke in his mother.    ROS:  Please see the history of present illness.  He also was told that he had fibromyalgia about 30 years ago  Otherwise, review of systems are positive for none.   All other systems are reviewed and negative.    PHYSICAL EXAM: VS:  BP 128/70 mmHg  Pulse 61  Ht 5\' 11"  (1.803 m)  Wt 179 lb (81.194 kg)  BMI 24.98 kg/m2 , BMI Body mass index is 24.98 kg/(m^2). GEN: Well nourished, well developed, in no acute distress HEENT: normal Neck: no JVD, carotid bruits, or masses Cardiac: RRR; no murmurs, rubs, or gallops,no edema  Respiratory:  clear to auscultation bilaterally, normal work of breathing GI: soft, nontender, nondistended, + BS MS: no deformity or atrophy Skin: warm and dry, no rash Neuro:  Strength and sensation are intact Psych: euthymic mood, full affect   EKG:  EKG is ordered today. The ekg ordered today demonstrates ventricular pacemaker with left bundle branch block  pattern.  Underlying atrial fibrillation.   Recent Labs: 02/15/2014: Pro B Natriuretic  peptide (BNP) 788.4* 11/27/2014: Hemoglobin 13.3; Platelets 155 11/28/2014: TSH 5.469* 12/25/2014: ALT 21; BUN 18; Creatinine, Ser 1.14; Potassium 4.8; Sodium 131*    Lipid Panel    Component Value Date/Time   CHOL 113 12/25/2014 1228   TRIG 59.0 12/25/2014 1228   HDL 44.30 12/25/2014 1228   CHOLHDL 3 12/25/2014 1228   VLDL 11.8 12/25/2014 1228   LDLCALC 57 12/25/2014 1228   LDLDIRECT 77.3 12/20/2010 0922      Wt Readings from Last 3 Encounters:  12/25/14 179 lb (81.194 kg)  11/28/14 187 lb 9.6 oz (85.095 kg)  11/05/14 174 lb (78.926 kg)        ASSESSMENT AND PLAN:  1. Ischemic heart disease status post CABG remotely and status post redo CABG in 2007. 2. Hyperlipidemia 3. hypertensive heart disease without heart failure 4. Anxiety 5. permanent atrial fibrillation with functioning ventricular pacemaker. On Coumadin. 6. Dyspnea. Most recent ejection fraction by echo on 12/13/13 is 30%, improved from 38% 7. systolic heart murmur at aortic area. Echo shows aortic valve sclerosis and mild mitral regurgitation 8. PAD. Recently seen by Dr. Gae Gallop. No surgery indicated at this point. 9.  Worsening myalgias of legs, possible secondary to statin therapy.  We will stop his Lipitor and switch him to a lower dose of generic Crestor   Current medicines are reviewed at length with the patient today.  The patient has concerns regarding medicines.  The following changes have been made:  Switch to Crestor  Labs/ tests ordered today include:   Orders Placed This Encounter  Procedures  . Lipid panel  . Basic metabolic panel  . Hepatic function panel  . EKG 12-Lead     Physician: Continue other medicines the same.  Switched to generic Crestor.  Return in 3 months for office visit lipid panel hepatic function panel and basal metabolic panel.  Berna Spare MD 12/25/2014 4:10 PM    [facility] [address] [address], [place], [place]   [address] Phone: [phone]; Fax: [phone]

## 2014-12-25 NOTE — Patient Instructions (Signed)
Medication Instructions:  STOP LIPITOR (ATORVASTATIN)  START CRESTOR 5 MG DAILY ABOUT A WEEK AFTER LEAVING OFF LIPITOR   Labwork: NONE  Testing/Procedures: NONE  Follow-Up: Your physician recommends that you schedule a follow-up appointment in:  3 months with fasting labs (LP/BMET/HFP)   Any Other Special Instructions Will Be Listed Below (If Applicable).

## 2014-12-26 NOTE — Progress Notes (Signed)
Quick Note:  Please report to patient. The recent labs are stable. Continue same medication and careful diet. ______ 

## 2015-01-01 ENCOUNTER — Telehealth: Payer: Self-pay | Admitting: *Deleted

## 2015-01-01 NOTE — Telephone Encounter (Signed)
Spoke with patient and aware of lab results Patient stated that his aching was better and feels that it was coming from his fibromyalgia not medication, never stopped Lipitor Discussed with  Dr. Mare Ferrari and ok to continue Lipitor ] Advised patient

## 2015-01-01 NOTE — Telephone Encounter (Signed)
-----   Message from Darlin Coco, MD sent at 12/26/2014  8:53 PM EDT ----- Please report to patient.  The recent labs are stable. Continue same medication and careful diet.

## 2015-01-10 ENCOUNTER — Other Ambulatory Visit: Payer: Self-pay | Admitting: Cardiology

## 2015-01-12 ENCOUNTER — Telehealth: Payer: Self-pay | Admitting: Cardiology

## 2015-01-12 ENCOUNTER — Encounter: Payer: Medicare Other | Admitting: *Deleted

## 2015-01-12 NOTE — Telephone Encounter (Signed)
Spoke with pt and reminded pt of remote transmission that is due today. Pt verbalized understanding.   

## 2015-01-12 NOTE — Telephone Encounter (Signed)
Spoke with patient and his legs were hurting bad yesterday, today they feel better Patient concerned coming from Lipitor and discussed changing Lipitor at last ov secondary to aches  Advised patient to hold Lipitor for 2 weeks and if legs better to resume at Lipitor 20 mg daily and to call back with update  Patient verbalized understanding' Discussed with  Dr. Mare Ferrari and he agrees with plan

## 2015-01-12 NOTE — Telephone Encounter (Signed)
New message   Patient calling    Pt c/o medication issue:  1. Name of Medication: Lipitor 40 mg   2. How are you currently taking this medication (dosage and times per day)? 40 on one day / 20 the other day   3. Are you having a reaction (difficulty breathing--STAT)? Muscle are sore   4. What is your medication issue? Want to come off medication

## 2015-01-13 ENCOUNTER — Encounter: Payer: Self-pay | Admitting: Cardiology

## 2015-01-13 ENCOUNTER — Ambulatory Visit (INDEPENDENT_AMBULATORY_CARE_PROVIDER_SITE_OTHER): Payer: Medicare Other | Admitting: Pharmacist Clinician (PhC)/ Clinical Pharmacy Specialist

## 2015-01-13 DIAGNOSIS — I482 Chronic atrial fibrillation, unspecified: Secondary | ICD-10-CM

## 2015-01-13 LAB — POCT INR: INR: 3.7

## 2015-01-13 NOTE — Patient Instructions (Signed)
Anticoagulation Dose Instructions as of 01/13/2015      [name] Tue Wed Thu Fri Sat   New Dose 5 mg 5 mg 5 mg 5 mg 5 mg 5 mg 2.5 mg    Description        No warfarin today then resume above schedule

## 2015-01-14 ENCOUNTER — Telehealth: Payer: Self-pay | Admitting: Internal Medicine

## 2015-01-14 NOTE — Telephone Encounter (Signed)
New message      Pt having problem with transmission Please call to discuss

## 2015-01-14 NOTE — Telephone Encounter (Signed)
Pt wanted to inform office that his home monitor needs to be replaced and he is getting a new one in about 20 days. He will send transmission once he receives new home monitor.

## 2015-01-15 ENCOUNTER — Ambulatory Visit (INDEPENDENT_AMBULATORY_CARE_PROVIDER_SITE_OTHER): Payer: Medicare Other | Admitting: Pediatrics

## 2015-01-15 ENCOUNTER — Encounter: Payer: Self-pay | Admitting: Pediatrics

## 2015-01-15 VITALS — BP 139/82 | HR 77 | Temp 97.2°F | Ht 71.0 in | Wt 186.6 lb

## 2015-01-15 DIAGNOSIS — K4090 Unilateral inguinal hernia, without obstruction or gangrene, not specified as recurrent: Secondary | ICD-10-CM | POA: Diagnosis not present

## 2015-01-15 NOTE — Progress Notes (Signed)
Subjective:    Patient ID: [name], male    DOB: 02/18/1941, 74 y.o.   MRN: [id number]  HPI: [name] is a 74 y.o. male presenting on 01/15/2015 for Groin Pain   Noticed L groin swelling slightly larger than R groin this morning. He wonders if he could have a hernia. The swelling felt slightly sore this morning when he pressed on it, but has not caused him pain or discomfort otherwise. He is walking fine with out pain. He does nto remember hurting himself. He tries hard to not lift anything heavier than a gallon of milk because he has an abdominal hernia that he has been told he shouldn't have fixed if it can be avoided due to risks of surgery, though his cardiac doctor has cleared him for the surgery in the recent past. No fevers, no discoloration to the groin mass, he has never had any inguinal hernias.  Relevant past medical, surgical, family and social history reviewed and updated as indicated. Interim medical history since our last visit reviewed. Allergies and medications reviewed and updated.   ROS: Per HPI unless specifically indicated above  Past Medical History Patient Active Problem List   Diagnosis Date Noted  . Unstable angina 11/28/2014  . 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 Chest pain with moderate risk for cardiac etiology 04/12/2013  . Midsternal chest pain 03/27/2013  . Ventral hernia 01/23/2013  . Angina pectoris 08/30/2012  . Chronic systolic CHF (congestive heart failure), NYHA class 2   . Chronic anticoagulation   . Atypical chest pain 03/11/2012  . Umbilical hernia 73/71/0626  . CAD (coronary artery disease) 10/20/2011  . Atrial fibrillation-Permanent 05/31/2011  . 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 Pacemaker-stJudes 05/31/2011  . Chest pain at rest 04/11/2011  . Cough, persistent 11/15/2010  . Hx of CABG 09/23/2010  . Ischemic cardiomyopathy   . Hypertension   . Hyperlipidemia   . GERD (gastroesophageal reflux  disease)   . Anxiety   . Fibromyalgia     Current Outpatient Prescriptions  Medication Sig Dispense Refill  . acetaminophen (TYLENOL) 500 MG tablet Take 500 mg by mouth every 8 (eight) hours as needed for mild pain.     [name] Kitchen ALPRAZolam (XANAX) 1 MG tablet Take 1 tablet (1 mg total) by mouth 3 (three) times daily as needed. For anxiety 270 tablet 1  . carvedilol (COREG) 6.25 MG tablet Take 1 tablet by mouth 2 (two) times daily.  2  . doxepin (SINEQUAN) 25 MG capsule 1 capsule every morning and 2 capsules at night 270 capsule 3  . fluticasone (FLONASE) 50 MCG/ACT nasal spray Place 2 sprays into both nostrils daily. 16 g 6  . furosemide (LASIX) 20 MG tablet One tablet by mouth daily as needed for swelling 30 tablet 5  . gabapentin (NEURONTIN) 100 MG capsule TAKE 1 BY MOUTH 4 TIMES DAILY 360 capsule 3  . isosorbide mononitrate (IMDUR) 60 MG 24 hr tablet TAKE 1 BY MOUTH DAILY 90 tablet 3  . JANTOVEN 5 MG tablet Take 1 tablet (5 mg total) by mouth daily. 30 tablet 1  . lisinopril (PRINIVIL,ZESTRIL) 5 MG tablet 1 tablet by mouth every other day or as directed 90 tablet 3  . loratadine (CLARITIN) 10 MG tablet Take 10 mg by mouth daily.    . Multiple Vitamin (MULTIVITAMIN) tablet Take 1 tablet by mouth daily.      . Multiple Vitamins-Minerals (PRESERVISION AREDS 2) CAPS Take 1 capsule by mouth 2 (two) times  daily.    [name] Kitchen NITROSTAT 0.4 MG SL tablet DISSOLVE ONE TABLET UNDER THE TONGUE EVERY 5 MINUTES AS NEEDED FOR CHEST PAIN.  DO NOT EXCEED A TOTAL OF 3 DOSES IN 15 MINUTES 100 tablet 0  . nystatin (MYCOSTATIN) 100000 UNIT/ML suspension Take 5 mLs (500,000 Units total) by mouth 4 (four) times daily. X 7 days. Then once daily 473 mL 3  . omeprazole (PRILOSEC) 20 MG capsule Take 1 capsule (20 mg total) by mouth 2 (two) times daily. 180 capsule 3  . polyethylene glycol (MIRALAX / GLYCOLAX) packet Take 17 g by mouth daily.     . rosuvastatin (CRESTOR) 5 MG tablet Take 1 tablet (5 mg total) by mouth daily. 30  tablet 3  . atorvastatin (LIPITOR) 40 MG tablet Take 40 mg by mouth as directed. Take by mouth 1/2 tablet daily     No current facility-administered medications for this visit.       Objective:    BP 139/82 mmHg  Pulse 77  Temp(Src) 97.2 F (36.2 C) (Oral)  Ht 5\' 11"  (1.803 m)  Wt 186 lb 9.6 oz (84.641 kg)  BMI 26.04 kg/m2  Wt Readings from Last 3 Encounters:  01/15/15 186 lb 9.6 oz (84.641 kg)  12/25/14 179 lb (81.194 kg)  11/28/14 187 lb 9.6 oz (85.095 kg)     Gen: NAD, alert, cooperative with exam, NCAT EYES: EOMI, no scleral injection or icterus CV: WWP Resp: normal WOB Abd: +BS, soft, NTND. no guarding or organomegaly, +easily reducible central abdominal hernia of apprx 10 cm GU: R groin with some swelling compared with left. No testicular swelling or masses in scrotum. Not able to reduce the mass. No pain with palpation. No redness or discoloration to the mass.  Ext: No edema, warm Neuro: Alert and oriented, strength equal b/l UE and LE, coodrination grossly normal MSK: normal muscle bulk. Nl ROM L hip.     Assessment & Plan:    [name] was seen today for groin pain, likely with inguinal hernia, non-obstructed, not strangulated. Offered to send for ultrasound though unlikely to change management at this time and he is not interested in surgery if it can be avoided. Gave return precautions. He will wait on ultrasound for now.  Diagnoses and all orders for this visit:  Unilateral inguinal hernia without obstruction or gangrene, recurrence not specified   Follow up plan: Return if symptoms worsen or fail to improve.  Assunta Found, MD [facility] 01/15/2015, 4:18 PM

## 2015-01-19 ENCOUNTER — Encounter: Payer: Self-pay | Admitting: Family Medicine

## 2015-01-19 ENCOUNTER — Ambulatory Visit (INDEPENDENT_AMBULATORY_CARE_PROVIDER_SITE_OTHER): Payer: Medicare Other | Admitting: Family Medicine

## 2015-01-19 VITALS — BP 132/82 | HR 64 | Temp 97.3°F | Ht 71.0 in | Wt 184.5 lb

## 2015-01-19 DIAGNOSIS — R1030 Lower abdominal pain, unspecified: Secondary | ICD-10-CM

## 2015-01-19 DIAGNOSIS — R1031 Right lower quadrant pain: Secondary | ICD-10-CM | POA: Insufficient documentation

## 2015-01-19 NOTE — Patient Instructions (Signed)
Great to meet you!  We will call you after you get your ultrasound, we will send you to Dr. Zella Richer  If it is there  Hernia A hernia occurs when an internal organ pushes out through a weak spot in the abdominal wall. Hernias most commonly occur in the groin and around the navel. Hernias often can be pushed back into place (reduced). Most hernias tend to get worse over time. Some abdominal hernias can get stuck in the opening (irreducible or incarcerated hernia) and cannot be reduced. An irreducible abdominal hernia which is tightly squeezed into the opening is at risk for impaired blood supply (strangulated hernia). A strangulated hernia is a medical emergency. Because of the risk for an irreducible or strangulated hernia, surgery may be recommended to repair a hernia. CAUSES   Heavy lifting.  Prolonged coughing.  Straining to have a bowel movement.  A cut (incision) made during an abdominal surgery. HOME CARE INSTRUCTIONS   Bed rest is not required. You may continue your normal activities.  Avoid lifting more than 10 pounds (4.5 kg) or straining.  Cough gently. If you are a smoker it is best to stop. Even the best hernia repair can break down with the continual strain of coughing. Even if you do not have your hernia repaired, a cough will continue to aggravate the problem.  Do not wear anything tight over your hernia. Do not try to keep it in with an outside bandage or truss. These can damage abdominal contents if they are trapped within the hernia sac.  Eat a normal diet.  Avoid constipation. Straining over long periods of time will increase hernia size and encourage breakdown of repairs. If you cannot do this with diet alone, stool softeners may be used. SEEK IMMEDIATE MEDICAL CARE IF:   You have a fever.  You develop increasing abdominal pain.  You feel nauseous or vomit.  Your hernia is stuck outside the abdomen, looks discolored, feels hard, or is tender.  You have any  changes in your bowel habits or in the hernia that are unusual for you.  You have increased pain or swelling around the hernia.  You cannot push the hernia back in place by applying gentle pressure while lying down. MAKE SURE YOU:   Understand these instructions.  Will watch your condition.  Will get help right away if you are not doing well or get worse. Document Released: 04/18/2005 Document Revised: 07/11/2011 Document Reviewed: 12/06/2007 [facility] Patient Information 2015 [place], [place]. This information is not intended to replace advice given to you by your health care provider. Make sure you discuss any questions you have with your health care provider.

## 2015-01-19 NOTE — Progress Notes (Signed)
   HPI  Patient presents today for continued right groin pain  Patient explains it is been hurting for about 5-6 days. He states that he noticed initially a right groin swelling. Now he's had persistent achy to sharp pain. The pain hurts worse with certain movements. Pain seems to be getting slightly worse.  He denies fever, chills, sweats. He denies color change at that area. He states that there is some tenderness to the touch but has not had any episodes of firmness and swelling concerning for incarceration.  He previously saw Dr. Elinor Parkinson at [facility] surgery and would like to follow-up with him should he need a surgeon. Recently his surgeon who stated that he would not perform surgery due to A. fib for a ventral hernia due to only mild pain, however at this time he states that he is very interested in surgery if it is something that could be surgically fixed.  PMH: Smoking status noted ROS: Per HPI  Objective: BP 132/82 mmHg  Pulse 64  Temp(Src) 97.3 F (36.3 C) (Oral)  Ht 5\' 11"  (1.803 m)  Wt 184 lb 8 oz (83.689 kg)  BMI 25.74 kg/m2 Gen: NAD, alert, cooperative with exam HEENT: NCAT CV: RRR, good S1/S2, no murmur Resp: CTABL, no wheezes, non-labored Neuro: Alert and oriented, No gross deficits Groin: Right groin with some slight swelling compared to the left, no redness or color change, no hernia bulge appreciated.   Assessment and plan:  # Right groin pain, likely right inguinal hernia Evaluate with ultrasound Refer to [facility] surgery per his preference Reviewed in detail reasons to seek emergency medical care and to return for care which he understands.  Orders Placed This Encounter  Procedures  . [place] Misc Soft Tissue    R groin hernia eval    Standing Status: Future     Number of Occurrences:      Standing Expiration Date: 03/20/2016    Scheduling Instructions:     Please send to [facility]    Order Specific Question:  Reason for Exam  (SYMPTOM  OR DIAGNOSIS REQUIRED)    Answer:  R groin pain, r/o occult hernia    Order Specific Question:  Preferred imaging location?    Answer:  [id number]    No orders of the defined types were placed in this encounter.    Laroy Apple, MD [facility] 01/19/2015, 2:44 PM

## 2015-01-21 ENCOUNTER — Telehealth: Payer: Self-pay | Admitting: Family Medicine

## 2015-01-22 ENCOUNTER — Emergency Department (HOSPITAL_COMMUNITY)
Admission: EM | Admit: 2015-01-22 | Discharge: 2015-01-22 | Disposition: A | Discharge status: Home / Self Care | Payer: No Typology Code available for payment source | Attending: Emergency Medicine | Admitting: Emergency Medicine

## 2015-01-22 ENCOUNTER — Emergency Department (HOSPITAL_COMMUNITY): Payer: No Typology Code available for payment source

## 2015-01-22 ENCOUNTER — Encounter (HOSPITAL_COMMUNITY): Payer: Self-pay | Admitting: Emergency Medicine

## 2015-01-22 ENCOUNTER — Ambulatory Visit
Admission: RE | Admit: 2015-01-22 | Discharge: 2015-01-22 | Disposition: A | Discharge status: Home / Self Care | Payer: Medicare Other | Source: Ambulatory Visit | Attending: Family Medicine | Admitting: Family Medicine

## 2015-01-22 DIAGNOSIS — K219 Gastro-esophageal reflux disease without esophagitis: Secondary | ICD-10-CM | POA: Diagnosis not present

## 2015-01-22 DIAGNOSIS — I5022 Chronic systolic (congestive) heart failure: Secondary | ICD-10-CM | POA: Insufficient documentation

## 2015-01-22 DIAGNOSIS — Z87891 Personal history of nicotine dependence: Secondary | ICD-10-CM | POA: Insufficient documentation

## 2015-01-22 DIAGNOSIS — Z87438 Personal history of other diseases of male genital organs: Secondary | ICD-10-CM | POA: Diagnosis not present

## 2015-01-22 DIAGNOSIS — Y9389 Activity, other specified: Secondary | ICD-10-CM | POA: Insufficient documentation

## 2015-01-22 DIAGNOSIS — F329 Major depressive disorder, single episode, unspecified: Secondary | ICD-10-CM | POA: Diagnosis not present

## 2015-01-22 DIAGNOSIS — I4891 Unspecified atrial fibrillation: Secondary | ICD-10-CM | POA: Diagnosis not present

## 2015-01-22 DIAGNOSIS — F410 Panic disorder [episodic paroxysmal anxiety] without agoraphobia: Secondary | ICD-10-CM | POA: Insufficient documentation

## 2015-01-22 DIAGNOSIS — Z79899 Other long term (current) drug therapy: Secondary | ICD-10-CM | POA: Diagnosis not present

## 2015-01-22 DIAGNOSIS — Y92410 Unspecified street and highway as the place of occurrence of the external cause: Secondary | ICD-10-CM | POA: Insufficient documentation

## 2015-01-22 DIAGNOSIS — R1031 Right lower quadrant pain: Secondary | ICD-10-CM

## 2015-01-22 DIAGNOSIS — S335XXA Sprain of ligaments of lumbar spine, initial encounter: Secondary | ICD-10-CM | POA: Diagnosis not present

## 2015-01-22 DIAGNOSIS — I10 Essential (primary) hypertension: Secondary | ICD-10-CM | POA: Diagnosis not present

## 2015-01-22 DIAGNOSIS — S199XXA Unspecified injury of neck, initial encounter: Secondary | ICD-10-CM | POA: Diagnosis present

## 2015-01-22 DIAGNOSIS — E785 Hyperlipidemia, unspecified: Secondary | ICD-10-CM | POA: Insufficient documentation

## 2015-01-22 DIAGNOSIS — S161XXA Strain of muscle, fascia and tendon at neck level, initial encounter: Secondary | ICD-10-CM | POA: Insufficient documentation

## 2015-01-22 DIAGNOSIS — Z85828 Personal history of other malignant neoplasm of skin: Secondary | ICD-10-CM | POA: Diagnosis not present

## 2015-01-22 DIAGNOSIS — Z951 Presence of aortocoronary bypass graft: Secondary | ICD-10-CM | POA: Diagnosis not present

## 2015-01-22 DIAGNOSIS — Y998 Other external cause status: Secondary | ICD-10-CM | POA: Insufficient documentation

## 2015-01-22 DIAGNOSIS — Z7951 Long term (current) use of inhaled steroids: Secondary | ICD-10-CM | POA: Insufficient documentation

## 2015-01-22 DIAGNOSIS — Z950 Presence of cardiac pacemaker: Secondary | ICD-10-CM | POA: Diagnosis not present

## 2015-01-22 DIAGNOSIS — I2510 Atherosclerotic heart disease of native coronary artery without angina pectoris: Secondary | ICD-10-CM | POA: Insufficient documentation

## 2015-01-22 LAB — BASIC METABOLIC PANEL
ANION GAP: 7 (ref 5–15)
BUN: 14 mg/dL (ref 6–20)
CO2: 29 mmol/L (ref 22–32)
Calcium: 8.9 mg/dL (ref 8.9–10.3)
Chloride: 96 mmol/L — ABNORMAL LOW (ref 101–111)
Creatinine, Ser: 0.96 mg/dL (ref 0.61–1.24)
GFR calc non Af Amer: >60 mL/min (ref >60)
GLUCOSE: 102 mg/dL — AB (ref 65–99)
POTASSIUM: 5.1 mmol/L (ref 3.5–5.1)
Sodium: 132 mmol/L — ABNORMAL LOW (ref 135–145)

## 2015-01-22 NOTE — Discharge Instructions (Signed)
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
## 2015-01-23 ENCOUNTER — Telehealth: Payer: Self-pay | Admitting: Internal Medicine
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
## 2015-01-23 NOTE — Telephone Encounter (Signed)
Remote appt r/s'd for 01/29/15. Patient aware and voiced understanding.

## 2015-01-28 ENCOUNTER — Encounter: Payer: Self-pay | Admitting: Pediatrics

## 2015-01-28 ENCOUNTER — Ambulatory Visit (INDEPENDENT_AMBULATORY_CARE_PROVIDER_SITE_OTHER): Payer: Medicare Other | Admitting: Pediatrics

## 2015-01-28 ENCOUNTER — Ambulatory Visit: Payer: Medicare Other

## 2015-01-28 DIAGNOSIS — M542 Cervicalgia: Secondary | ICD-10-CM

## 2015-01-28 DIAGNOSIS — R1031 Right lower quadrant pain: Secondary | ICD-10-CM

## 2015-01-28 DIAGNOSIS — R1030 Lower abdominal pain, unspecified: Secondary | ICD-10-CM | POA: Diagnosis not present

## 2015-01-28 DIAGNOSIS — Z5181 Encounter for therapeutic drug level monitoring: Secondary | ICD-10-CM

## 2015-01-28 DIAGNOSIS — I4891 Unspecified atrial fibrillation: Secondary | ICD-10-CM | POA: Diagnosis not present

## 2015-01-28 DIAGNOSIS — Z23 Encounter for immunization: Secondary | ICD-10-CM

## 2015-01-28 LAB — POCT INR: INR: 2.7
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
## 2015-01-28 NOTE — Patient Instructions (Signed)
Keep taking tylenol for neck and back pain.

## 2015-01-29 ENCOUNTER — Ambulatory Visit (INDEPENDENT_AMBULATORY_CARE_PROVIDER_SITE_OTHER): Payer: Medicare Other | Admitting: *Deleted

## 2015-01-29 DIAGNOSIS — I442 Atrioventricular block, complete: Secondary | ICD-10-CM

## 2015-01-30 NOTE — Progress Notes (Signed)
Remote pacemaker transmission.   

## 2015-02-08 ENCOUNTER — Telehealth: Payer: Self-pay | Admitting: Family Medicine

## 2015-02-08 NOTE — Telephone Encounter (Signed)
Pt calling in after hours with concern for  A hernia. He is having abd bloating and thinks he is constipated. He recently saw a surgeon who said he had an inguinal hernia but no umbilical hernia. He is having mostly umbilical pain, no groin pain.   He is planning to take MOM and relax. Will seek medical help for worsening pain or new n/v.   Laroy Apple, MD [facility] 02/08/2015, 5:23 PM

## 2015-02-09 ENCOUNTER — Ambulatory Visit: Payer: Medicare Other | Admitting: Family Medicine

## 2015-02-12 ENCOUNTER — Encounter: Payer: Self-pay | Admitting: Pediatrics

## 2015-02-12 ENCOUNTER — Ambulatory Visit (INDEPENDENT_AMBULATORY_CARE_PROVIDER_SITE_OTHER): Payer: Medicare Other | Admitting: Pediatrics

## 2015-02-12 VITALS — BP 126/66 | HR 63 | Temp 97.2°F | Ht 71.0 in | Wt 188.2 lb

## 2015-02-12 DIAGNOSIS — S134XXD Sprain of ligaments of cervical spine, subsequent encounter: Secondary | ICD-10-CM | POA: Diagnosis not present
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
## 2015-02-13 LAB — CUP PACEART REMOTE DEVICE CHECK
Brady Statistic RV Percent Paced: 99 %
Date Time Interrogation Session: 20160929141703
Implantable Lead Implant Date: 20120927
Implantable Lead Location: 753860
Lead Channel Pacing Threshold Amplitude: 0.75 V
Lead Channel Sensing Intrinsic Amplitude: 12 mV
Lead Channel Setting Pacing Amplitude: 2.5 V
MDC IDC LEAD IMPLANT DT: 20120927
MDC IDC LEAD LOCATION: 753859
MDC IDC MSMT BATTERY REMAINING LONGEVITY: 122 mo
MDC IDC MSMT BATTERY REMAINING PERCENTAGE: 91 %
MDC IDC MSMT BATTERY VOLTAGE: 2.95 V
MDC IDC MSMT LEADCHNL RV IMPEDANCE VALUE: 480 Ohm
MDC IDC MSMT LEADCHNL RV PACING THRESHOLD PULSEWIDTH: 0.4 ms
MDC IDC PG SERIAL: 7282780
MDC IDC SET LEADCHNL RV PACING PULSEWIDTH: 0.4 ms
MDC IDC SET LEADCHNL RV SENSING SENSITIVITY: 2 mV
Pulse Gen Model: 2110

## 2015-02-17 ENCOUNTER — Ambulatory Visit (INDEPENDENT_AMBULATORY_CARE_PROVIDER_SITE_OTHER): Payer: Medicare Other | Admitting: Pharmacist Clinician (PhC)/ Clinical Pharmacy Specialist

## 2015-02-17 DIAGNOSIS — I482 Chronic atrial fibrillation, unspecified: Secondary | ICD-10-CM

## 2015-02-17 LAB — POCT INR: INR: 2.5

## 2015-02-18 ENCOUNTER — Ambulatory Visit (INDEPENDENT_AMBULATORY_CARE_PROVIDER_SITE_OTHER): Payer: Medicare Other | Admitting: Pediatrics

## 2015-02-18 ENCOUNTER — Encounter: Payer: Self-pay | Admitting: Pediatrics

## 2015-02-18 VITALS — BP 108/63 | HR 62 | Temp 97.8°F | Ht 71.0 in | Wt 186.0 lb

## 2015-02-18 DIAGNOSIS — K4090 Unilateral inguinal hernia, without obstruction or gangrene, not specified as recurrent: Secondary | ICD-10-CM | POA: Diagnosis not present

## 2015-02-18 DIAGNOSIS — S134XXD Sprain of ligaments of cervical spine, subsequent encounter: Secondary | ICD-10-CM
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
## 2015-02-25 ENCOUNTER — Other Ambulatory Visit: Payer: Self-pay | Admitting: Physician Assistant

## 2015-03-05 ENCOUNTER — Telehealth: Payer: Self-pay | Admitting: Cardiology

## 2015-03-05 DIAGNOSIS — F419 Anxiety disorder, unspecified: Secondary | ICD-10-CM

## 2015-03-05 NOTE — Telephone Encounter (Signed)
Unable to leave message secondary to vm not being set up

## 2015-03-05 NOTE — Telephone Encounter (Signed)
Pt requesting a refill on alprazolam 1 mg. Please advise

## 2015-03-05 NOTE — Telephone Encounter (Signed)
New Message    Pt calling stating that he needs Rip Harbour to call him back before she leaves today. I asked the pt what the call was in regards to and he said Lipitor. I asked the pt if he was having issues with this medication or needed a refill and the pt states "Just have Rip Harbour call me back, I just need to talk to her." Please call back and advise.

## 2015-03-05 NOTE — Telephone Encounter (Signed)
Okay for him to switch to simvastatin 40 mg daily

## 2015-03-06 MED ORDER — ALPRAZOLAM 1 MG PO TABS: 1.0000 mg | ORAL_TABLET | Freq: Three times a day (TID) | ORAL | Status: DC | PRN

## 2015-03-06 MED ORDER — SIMVASTATIN 40 MG PO TABS: 40.0000 mg | ORAL_TABLET | Freq: Every day | ORAL | Status: DC

## 2015-03-06 NOTE — Telephone Encounter (Signed)
Advised and will sent to pharmacy

## 2015-03-06 NOTE — Telephone Encounter (Signed)
Printed to fax to mail order

## 2015-03-11 ENCOUNTER — Encounter: Payer: Self-pay | Admitting: Pediatrics

## 2015-03-11 ENCOUNTER — Ambulatory Visit (INDEPENDENT_AMBULATORY_CARE_PROVIDER_SITE_OTHER): Payer: Medicare Other | Admitting: Pediatrics

## 2015-03-11 VITALS — BP 135/78 | HR 65 | Temp 97.0°F | Ht 71.0 in | Wt 185.6 lb

## 2015-03-11 DIAGNOSIS — M542 Cervicalgia: Secondary | ICD-10-CM | POA: Insufficient documentation

## 2015-03-11 DIAGNOSIS — I10 Essential (primary) hypertension: Secondary | ICD-10-CM

## 2015-03-11 DIAGNOSIS — M79662 Pain in left lower leg: Secondary | ICD-10-CM | POA: Diagnosis not present

## 2015-03-11 DIAGNOSIS — I5022 Chronic systolic (congestive) heart failure: Secondary | ICD-10-CM | POA: Diagnosis not present

## 2015-03-11 DIAGNOSIS — R1031 Right lower quadrant pain: Secondary | ICD-10-CM

## 2015-03-11 DIAGNOSIS — R1030 Lower abdominal pain, unspecified: Secondary | ICD-10-CM | POA: Diagnosis not present

## 2015-03-11 DIAGNOSIS — M79661 Pain in right lower leg: Secondary | ICD-10-CM | POA: Diagnosis not present

## 2015-03-11 NOTE — Progress Notes (Signed)
Subjective:    Patient ID: [name], male    DOB: 01-Nov-1940, 74 y.o.   MRN: [id number]  CC: neck pain   HPI: [name] is a 74 y.o. male presenting on 03/11/2015 for Neck Pain  Having some b/l LE pain, worst in calves. Not bad in thighs. None in feet. He spoke with his cardiologist by phone, is going to do trial off of atorvastatin for two weeks then start simvastatin. If still having pain he is going to recheck his arterial dopplers per pt. No pain in his toes/feet. Last year had dopplers on L leg that showed some narrowing but not enough to warrant surgery.   Neck pain similar. Has not yet started PT. Looks like they have tried to contact pt multiple times but havent been able to get through  R groin hernia sometimes causes pain. None recently. He called and canceled appt with surgeon because pain got much better since last visit with me.   Took mucinex for 10 days last week for a URI. Feeling better now. No coughing.  Relevant past medical, surgical, family and social history reviewed and updated as indicated. Interim medical history since our last visit reviewed. Allergies and medications reviewed and updated.   ROS: Per HPI unless specifically indicated above  Past Medical History Patient Active Problem List   Diagnosis Date Noted  . Bilateral calf pain 03/11/2015  . Neck pain 03/11/2015  . Right groin pain 01/19/2015  . Unstable angina ([place]) 11/28/2014  . Claudication of gluteal region ([place]) 01/10/2014  . Chest pain with moderate risk for cardiac etiology 04/12/2013  . Midsternal chest pain 03/27/2013  . Ventral hernia 01/23/2013  . Angina pectoris ([place]) 08/30/2012  . Chronic systolic CHF (congestive heart failure), NYHA class 2 ([place])   . Chronic anticoagulation   . Atypical chest pain 03/11/2012  . Umbilical hernia 02/54/2706  . CAD (coronary artery disease) 10/20/2011  . Atrial fibrillation-Permanent 05/31/2011  . Complete heart block-intermittent  05/31/2011  . Pacemaker-stJudes 05/31/2011  . Chest pain at rest 04/11/2011  . Cough, persistent 11/15/2010  . Hx of CABG 09/23/2010  . Ischemic cardiomyopathy   . Hypertension   . Hyperlipidemia   . GERD (gastroesophageal reflux disease)   . Anxiety   . Fibromyalgia     Current Outpatient Prescriptions  Medication Sig Dispense Refill  . acetaminophen (TYLENOL) 500 MG tablet Take 500 mg by mouth every 8 (eight) hours as needed for mild pain.     [name] Kitchen ALPRAZolam (XANAX) 1 MG tablet Take 1 tablet (1 mg total) by mouth 3 (three) times daily as needed. For anxiety 270 tablet 1  . atorvastatin (LIPITOR) 40 MG tablet Take 40 mg by mouth as directed. Take by mouth 1/2 tablet daily    . carvedilol (COREG) 6.25 MG tablet Take 1 tablet by mouth 2 (two) times daily.  2  . doxepin (SINEQUAN) 25 MG capsule 1 capsule every morning and 2 capsules at night 270 capsule 3  . fluticasone (FLONASE) 50 MCG/ACT nasal spray Place 2 sprays into both nostrils daily. 16 g 6  . furosemide (LASIX) 20 MG tablet One tablet by mouth daily as needed for swelling 30 tablet 5  . gabapentin (NEURONTIN) 100 MG capsule TAKE 1 BY MOUTH 4 TIMES DAILY 360 capsule 3  . isosorbide mononitrate (IMDUR) 60 MG 24 hr tablet TAKE 1 BY MOUTH DAILY 90 tablet 3  . JANTOVEN 5 MG tablet Take 1 tablet (5 mg total) by mouth daily. [place]  tablet 1  . lisinopril (PRINIVIL,ZESTRIL) 5 MG tablet 1 tablet by mouth every other day or as directed 90 tablet 3  . loratadine (CLARITIN) 10 MG tablet Take 10 mg by mouth daily.    . Multiple Vitamin (MULTIVITAMIN) tablet Take 1 tablet by mouth daily.      . Multiple Vitamins-Minerals (PRESERVISION AREDS 2) CAPS Take 1 capsule by mouth 2 (two) times daily.    [name] Kitchen omeprazole (PRILOSEC) 20 MG capsule Take 1 capsule (20 mg total) by mouth 2 (two) times daily. 180 capsule 3  . polyethylene glycol (MIRALAX / GLYCOLAX) packet Take 17 g by mouth daily.     . simvastatin (ZOCOR) 40 MG tablet Take 1 tablet (40 mg total)  by mouth at bedtime. 30 tablet 5  . NITROSTAT 0.4 MG SL tablet DISSOLVE ONE TABLET UNDER THE TONGUE EVERY 5 MINUTES AS NEEDED FOR CHEST PAIN.  DO NOT EXCEED A TOTAL OF 3 DOSES IN 15 MINUTES (Patient not taking: Reported on 03/11/2015) 100 tablet 0   No current facility-administered medications for this visit.       Objective:    BP 135/78 mmHg  Pulse 65  Temp(Src) 97 F (36.1 C) (Oral)  Ht 5\' 11"  (1.803 m)  Wt 185 lb 9.6 oz (84.188 kg)  BMI 25.90 kg/m2  Wt Readings from Last 3 Encounters:  03/11/15 185 lb 9.6 oz (84.188 kg)  02/18/15 186 lb (84.369 kg)  02/12/15 188 lb 3.2 oz (85.367 kg)    Gen: NAD, alert, cooperative with exam, NCAT EYES: EOMI, no scleral injection or icterus LYMPH: no cervical LAD CV: NRRR, normal S1/S2, no murmur, R DP pulse 1+, cap refill < 2 sec, WWP  Resp: CTABL, no wheezes, normal WOB Ext: No edema, warm, no calf pain with  Neuro: Alert and oriented, strength equal b/l UE and LE, coordination grossly normal MSK: ROM slightly improved in neck, decreased with turning head to R compared with L.       Assessment & Plan:   [name] was seen today for neck pain and multiple med problem f/u.  Diagnoses and all orders for this visit:  Right groin pain Improved. Will let me or his surgeon know if worsens. Understands return precautions.  Chronic systolic CHF (congestive heart failure), NYHA class 2 ([facility]) No swelling LE. Not taking lasix regularly bc no swelling. No change in symptoms. Has follow up with cardiology in two weeks.  Bilateral calf pain Sometimes worse with walking. SOmeitmes improves when he sits. Doing trial off of atorvastatin per cardiology for next two weeks, has follow up with cardiology soon. Feet are WWP.  Essential hypertension Well controlled today. Continue current meds  Neck pain Still present. Not yet followe dup with PT. Encouraged him to call them to set up appt.  Follow up plan: Return in about 2 months (around  05/11/2015).  Assunta Found, MD [facility] 03/11/2015, 3:01 PM

## 2015-03-12 NOTE — Telephone Encounter (Signed)
[facility] faxed a request for refills on Alprazolam 1 mg tablet. I called [facility] and they still had not received the fax. I gave a verbal order, per Melina, RN, Dr. Sherryl Barters nurse,  to refill alprazolam 1 mg taken 3 times a day as needed, dispensing 270 tablets, with 1 refill. Spoke with Payal, Pharmacist.

## 2015-03-17 ENCOUNTER — Ambulatory Visit: Payer: Medicare Other | Attending: Pediatrics | Admitting: Physical Therapy

## 2015-03-17 ENCOUNTER — Encounter: Payer: Self-pay | Admitting: Physical Therapy

## 2015-03-17 DIAGNOSIS — M542 Cervicalgia: Secondary | ICD-10-CM | POA: Insufficient documentation

## 2015-03-17 DIAGNOSIS — R293 Abnormal posture: Secondary | ICD-10-CM | POA: Diagnosis present

## 2015-03-17 DIAGNOSIS — M436 Torticollis: Secondary | ICD-10-CM | POA: Diagnosis present
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
## 2015-03-17 NOTE — Patient Instructions (Addendum)
  Flexibility: Upper Trapezius Stretch   Gently grasp right side of head while reaching behind back with other hand. Tilt head away until a gentle stretch is felt. Hold 30 seconds. Repeat 3 times per set. Do 2 sessions per day.  http://orth.exer.us/340   Levator Stretch   Grasp seat or sit on hand on side to be stretched. Turn head toward other side and look down. Use hand on head to gently stretch neck in that position. Hold _30___ seconds. Repeat on other side. Repeat 3 times. Do 2 sessions per day.  http://gt2.exer.us/30   Scapular Retraction (Standing)   With arms at sides, pinch shoulder blades together. Repeat 10 times per set. Do 1-3 sets per session. Do 2 sessions per day.  http://orth.exer.us/944      Posture - Sitting   Sit upright, head facing forward. Try using a roll to support lower back. Keep shoulders relaxed, and avoid rounded back. Keep hips level with knees. Avoid crossing legs for long periods.  [name], PT 03/17/2015 2:21 PM [facility] [place], [place], [address] Phone: [phone]   Fax:  [phone]

## 2015-03-20 ENCOUNTER — Ambulatory Visit (INDEPENDENT_AMBULATORY_CARE_PROVIDER_SITE_OTHER): Payer: Medicare Other | Admitting: Pediatrics

## 2015-03-20 ENCOUNTER — Encounter: Payer: Self-pay | Admitting: Pediatrics

## 2015-03-20 VITALS — BP 122/72 | HR 60 | Temp 96.7°F | Ht 71.0 in | Wt 184.0 lb

## 2015-03-20 DIAGNOSIS — S134XXD Sprain of ligaments of cervical spine, subsequent encounter: Secondary | ICD-10-CM
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
## 2015-03-23 ENCOUNTER — Other Ambulatory Visit: Payer: Self-pay | Admitting: Family Medicine

## 2015-03-23 ENCOUNTER — Ambulatory Visit: Payer: Medicare Other | Admitting: Physical Therapy

## 2015-03-23 DIAGNOSIS — M542 Cervicalgia: Secondary | ICD-10-CM | POA: Diagnosis not present

## 2015-03-23 DIAGNOSIS — R293 Abnormal posture: Secondary | ICD-10-CM

## 2015-03-23 DIAGNOSIS — M436 Torticollis: Secondary | ICD-10-CM

## 2015-03-23 NOTE — Therapy (Addendum)
[facility] [place], [place], [address] Phone: [phone]   Fax:  [phone]  Physical Therapy Treatment  Patient Details  Name: [name] MRN: [id number] Date of Birth: 10/12/1940 Referring Provider: Assunta Found, MD  Encounter Date: 03/23/2015      PT End of Session - 03/23/15 1258    Visit Number 2   Number of Visits 12   Date for PT Re-Evaluation 05/05/15   PT Start Time 1300   PT Stop Time 1345   PT Time Calculation (min) 45 min   Activity Tolerance Patient tolerated treatment well   Behavior During Therapy [facility] for tasks assessed/performed      Past Medical History  Diagnosis Date  . Ischemic cardiomyopathy     a. 06/2012 EF 25% by echo.  . Hypertension   . Hyperlipidemia   . GERD (gastroesophageal reflux disease)   . CAD (coronary artery disease)     a. 1991 s/p CABGx4;  b. redo CABG in 2007 (SVG->OM, VG->PDA->PLV);  c. 01/2011 Cath: LM 80ost, LAD 100p, LCX 100ost, OM1 100, RCA 100, LIMA->LAD ok, VG->PDA ok, VG->OM ok;  d. 03/2012 MV: EF 32% inf & inflat scar w/o ischemia; e. Lexi MV: EF 33%, inf infarct, no ischemia. f. lexiscan 06/12/2014 RCA scar, no ischemia  . Atrial flutter ([place])     a. 01/2011 s/p unsuccessful RFCA complicated by CHB req PPM.  . CHB (complete heart block) ([place])     a. 01/2011: in setting of RFCA, s/p SJM 2110 Accent DC PPM, ser # [id number].  [name] Kitchen Kidney cysts     a. bilateral  . Hiatal hernia     a. 03/01/2013 EGD and esoph dil.  [name] Kitchen BPH (benign prostatic hyperplasia)     a. elevated PSA  . A-fib (HCC)     a. chronic coumadin  . Elevated PSA, less than 10 ng/ml   . Fibromyalgia   . Skin cancer     'above left eyebrow; tip of my nose" (08/29/2012)  . Depression     "since 1986-1987" (08/29/2012)  . Anxiety   . Panic attacks   . Chronic systolic CHF (congestive heart failure), NYHA class 2 ([place])     a. 06/2012 Echo: EF 25%, distal sept and apical AK, sev dil LV, mild LVH, mod dil  LA.  [name] Kitchen Pacemaker     Past Surgical History  Procedure Laterality Date  . Cardiac catheterization  10/30/2009    Grafts patent. Mild to moderate LV dysfunction. EF 45%. Managed medically.   . Cardiac catheterization      multiple  . Tonsillectomy  1960  . Cholecystectomy  2007  . Insert / replace / remove pacemaker  01/2011    initial placement  . Inguinal hernia repair  1960's    "left I think"  . Cataract extraction w/ intraocular lens  implant, bilateral  2012  . Cardioversion  05/25/2012    Procedure: CARDIOVERSION;  Surgeon: Darlin Coco, MD;  Location: [facility] ENDOSCOPY;  Service: Cardiovascular;  Laterality: N/A;  . Humerus surgery Left 1960    "growth on the bone taken off; not cancer" (08/29/2012)  . Excisional hemorrhoidectomy  ~ 1980  . Skin cancer excision      'above left eyebrow; tip of my nose" (08/29/2012)  . Atrial flutter ablation  01/2011    "didn't work" (08/29/2012)  . Coronary artery bypass graft  08/27/1989    CABG X 5  . Coronary artery bypass graft  06/2005  CABG X 3    There were no vitals filed for this visit.  Visit Diagnosis:  Neck pain  Stiffness of cervical spine  Abnormal posture      Subjective Assessment - 03/23/15 1305    Subjective Patient states he is scheduling an appointment with Dr. Jeralyn Ruths office re: his neck.   Pertinent History PACEMAKER; Hernia R inguinal, HTN, 2 heart bypasses, LBP   Patient Stated Goals Decrease pan, improve movement and strength   Currently in Pain? Yes   Pain Score 6    Pain Location Neck   Pain Orientation Right;Left   Pain Descriptors / Indicators Sore   Pain Type Acute pain   Pain Onset More than a month ago   Pain Frequency Intermittent   Aggravating Factors  turning head   Pain Relieving Factors rest   Effect of Pain on Daily Activities it hurts!   Multiple Pain Sites No                         [facility] Adult PT Treatment/Exercise - 03/23/15 0001    Exercises   Exercises Neck    Neck Exercises: Theraband   Scapula Retraction 20 reps   Scapula Retraction Limitations difficult for pt without manual assist    Modalities   Modalities Ultrasound   Ultrasound   Ultrasound Location Bil cerv paraspinals/UT to subocciptials   Ultrasound Parameters 1.5 wcm2 3.3 mhz cont x 10 min   Ultrasound Goals Pain   Manual Therapy   Manual Therapy Soft tissue mobilization;Myofascial release;Passive ROM   Manual therapy comments performed in massage chair due to hernia   Soft tissue mobilization B UT, scalenes and subocciptials   Myofascial Release TPR to B UT   Passive ROM passive stretch to Bil UT, scalenes                PT Education - 03/23/15 1514    Education provided Yes   Education Details modified HEP to do therex in recliner as patient cannot lay supine due to hernia   Person(s) Educated Patient   Methods Explanation;Demonstration   Comprehension Verbalized understanding          PT Short Term Goals - 03/17/15 1526    PT SHORT TERM GOAL #1   Title I with initial HEP 03/31/15   Time 2   Period Weeks   Status New           PT Long Term Goals - 03/17/15 1527    PT LONG TERM GOAL #1   Title I with advanced HEP   Time 7   Period Weeks   Status New   PT LONG TERM GOAL #2   Title improved R cerv rotation to equal L to improve safety with driving.   Time 7   Period Weeks   Status New   PT LONG TERM GOAL #3   Title decreased pain with cervical mobiility to 2/10 or less.   Time 7   Period Weeks   Status New   PT LONG TERM GOAL #4   Title Improved cerv SB to 15 degrees or better B   Time 7   Period Weeks   Status New               Plan - 03/23/15 1516    Clinical Impression Statement Patient tolerated therapy well and responded well to [place] and deep STW. He has significant difficulty with scapular retractions requiring manual assist and  VC's. No goals met as only second visit.   Pt will benefit from skilled therapeutic intervention in  order to improve on the following deficits Decreased range of motion;Pain;Impaired flexibility;Decreased strength;Postural dysfunction   Rehab Potential Excellent   Clinical Impairments Affecting Rehab Potential R inguinal hernia (no lifting over 10#); no lying supine.   PT Frequency 2x / week   PT Duration 6 weeks   PT Treatment/Interventions ADLs/Self Care Home Management;Moist Heat;Traction;Ultrasound;Therapeutic exercise;Manual techniques;Dry needling;Patient/family education;Neuromuscular re-education   PT Next Visit Plan [place] to B UT f/b STW to same and paraspinals; continue with scapular and cervical retraction. No estim (pacemaker)   PT Home Exercise Plan cervical stretches, supine retraction, scapular retraction in recliner   Consulted and Agree with Plan of Care Patient        Problem List Patient Active Problem List   Diagnosis Date Noted  . Bilateral calf pain 03/11/2015  . Neck pain 03/11/2015  . Right groin pain 01/19/2015  . Unstable angina ([place]) 11/28/2014  . Claudication of gluteal region ([place]) 01/10/2014  . Chest pain with moderate risk for cardiac etiology 04/12/2013  . Midsternal chest pain 03/27/2013  . Ventral hernia 01/23/2013  . Angina pectoris ([place]) 08/30/2012  . Chronic systolic CHF (congestive heart failure), NYHA class 2 ([place])   . Chronic anticoagulation   . Atypical chest pain 03/11/2012  . Umbilical hernia 94/70/9628  . CAD (coronary artery disease) 10/20/2011  . Atrial fibrillation-Permanent 05/31/2011  . 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 Pacemaker-stJudes 05/31/2011  . Chest pain at rest 04/11/2011  . Cough, persistent 11/15/2010  . Hx of CABG 09/23/2010  . Ischemic cardiomyopathy   . Hypertension   . Hyperlipidemia   . GERD (gastroesophageal reflux disease)   . Anxiety   . Fibromyalgia     Madelyn Flavors PT  03/23/2015, 3:20 PM  [facility] [address] [place], [place],  [address] Phone: [phone]   Fax:  [phone]  Name: [name] MRN: [id number] Date of Birth: 09/15/1940   PHYSICAL THERAPY DISCHARGE SUMMARY  Visits from Start of Care: 2.  Current functional level related to goals / functional outcomes: See above.   Remaining deficits: See below.   Education / Equipment: HEP. Plan: Patient agrees to discharge.  Patient goals were not met. Patient is being discharged due to not returning since the last visit.  ?????         Mali Applegate MPT

## 2015-03-25 ENCOUNTER — Other Ambulatory Visit (INDEPENDENT_AMBULATORY_CARE_PROVIDER_SITE_OTHER): Payer: Medicare Other | Admitting: *Deleted

## 2015-03-25 ENCOUNTER — Ambulatory Visit (INDEPENDENT_AMBULATORY_CARE_PROVIDER_SITE_OTHER): Payer: Medicare Other | Admitting: Cardiology

## 2015-03-25 ENCOUNTER — Encounter: Payer: Self-pay | Admitting: *Deleted

## 2015-03-25 ENCOUNTER — Encounter: Payer: Self-pay | Admitting: Cardiology

## 2015-03-25 VITALS — BP 124/68 | HR 71 | Ht 71.0 in | Wt 181.8 lb

## 2015-03-25 DIAGNOSIS — I442 Atrioventricular block, complete: Secondary | ICD-10-CM | POA: Diagnosis not present

## 2015-03-25 DIAGNOSIS — I10 Essential (primary) hypertension: Secondary | ICD-10-CM

## 2015-03-25 DIAGNOSIS — I259 Chronic ischemic heart disease, unspecified: Secondary | ICD-10-CM

## 2015-03-25 DIAGNOSIS — E785 Hyperlipidemia, unspecified: Secondary | ICD-10-CM | POA: Diagnosis not present

## 2015-03-25 DIAGNOSIS — F419 Anxiety disorder, unspecified: Secondary | ICD-10-CM | POA: Diagnosis not present

## 2015-03-25 DIAGNOSIS — I482 Chronic atrial fibrillation, unspecified: Secondary | ICD-10-CM

## 2015-03-25 LAB — HEPATIC FUNCTION PANEL
ALT: 17 U/L (ref 9–46)
AST: 21 U/L (ref 10–35)
Albumin: 3.9 g/dL (ref 3.6–5.1)
Alkaline Phosphatase: 83 U/L (ref 40–115)
BILIRUBIN DIRECT: 0.3 mg/dL — AB (ref <=0.2)
BILIRUBIN INDIRECT: 0.9 mg/dL (ref 0.2–1.2)
Total Bilirubin: 1.2 mg/dL (ref 0.2–1.2)
Total Protein: 6.2 g/dL (ref 6.1–8.1)

## 2015-03-25 LAB — LIPID PANEL
CHOLESTEROL: 131 mg/dL (ref 125–200)
HDL: 42 mg/dL (ref >=40)
LDL Cholesterol: 73 mg/dL (ref <130)
Total CHOL/HDL Ratio: 3.1 Ratio (ref <=5.0)
Triglycerides: 82 mg/dL (ref <150)
VLDL: 16 mg/dL (ref <30)

## 2015-03-25 MED ORDER — ROSUVASTATIN CALCIUM 10 MG PO TABS: 10.0000 mg | ORAL_TABLET | Freq: Every day | ORAL | Status: DC

## 2015-03-25 NOTE — Addendum Note (Signed)
Addended by: Eulis Foster on: 03/25/2015 07:52 AM   Modules accepted: Orders

## 2015-03-25 NOTE — Patient Instructions (Addendum)
Medication Instructions:  STOP SIMVASTATIN AND START CRESTOR 5 MG DAILY   Labwork: none  Testing/Procedures: none  Follow-Up: Your physician recommends that you schedule a follow-up appointment in: 4 months with fasting labs (lp/bmet/hfp) with Dr Percival Spanish in [place]   If you need a refill on your cardiac medications before your next appointment, please call your pharmacy.

## 2015-03-25 NOTE — Addendum Note (Signed)
Addended by: Eulis Foster on: 03/25/2015 07:44 AM   Modules accepted: Orders
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
## 2015-03-27 NOTE — Progress Notes (Signed)
Quick Note:  Please report to patient. The recent labs are stable. Continue same medication and careful diet. He is now switching from lipitor to crestor. ______

## 2015-03-30 ENCOUNTER — Telehealth: Payer: Self-pay | Admitting: Pediatrics

## 2015-03-30 NOTE — Telephone Encounter (Signed)
Stp and he hit his head on the door frame this weekend. He had spoke to the oncall doctor already. He didn't have any signs or symptoms of syncope. Pt advised to continue to monitor and to CB with any questions or concerns. Pt voiced understanding.

## 2015-03-31 ENCOUNTER — Ambulatory Visit (INDEPENDENT_AMBULATORY_CARE_PROVIDER_SITE_OTHER): Payer: Medicare Other | Admitting: Pharmacist Clinician (PhC)/ Clinical Pharmacy Specialist

## 2015-03-31 ENCOUNTER — Encounter: Payer: Medicare Other | Admitting: Physical Therapy

## 2015-03-31 DIAGNOSIS — I4891 Unspecified atrial fibrillation: Secondary | ICD-10-CM

## 2015-03-31 DIAGNOSIS — I482 Chronic atrial fibrillation, unspecified: Secondary | ICD-10-CM

## 2015-03-31 LAB — POCT INR: INR: 2.5

## 2015-03-31 NOTE — Patient Instructions (Signed)
Anticoagulation Dose Instructions as of 03/31/2015      [name] Tue Wed Thu Fri Sat   New Dose 5 mg 5 mg 5 mg 5 mg 5 mg 5 mg 2.5 mg    Description        Continue same dose     INR was 2.5

## 2015-04-01 ENCOUNTER — Telehealth: Payer: Self-pay | Admitting: Cardiology

## 2015-04-01 NOTE — Telephone Encounter (Signed)
New Message  Pt requested to speak w/ Rip Harbour only- Please call back and discuss.

## 2015-04-01 NOTE — Telephone Encounter (Signed)
Spoke with patient and he took 1 Crestor and ached all over  Advised patient unlikely it was the Crestor but ok take the Simvastatin for a few days as then try Crestor again Patient agreeable to plan,will call back if any further issues

## 2015-04-03 ENCOUNTER — Encounter: Payer: Self-pay | Admitting: Internal Medicine

## 2015-04-07 ENCOUNTER — Encounter: Payer: Medicare Other | Admitting: Physical Therapy

## 2015-04-14 ENCOUNTER — Encounter: Payer: Medicare Other | Admitting: Physical Therapy

## 2015-04-25 ENCOUNTER — Emergency Department (HOSPITAL_COMMUNITY): Payer: Medicare Other

## 2015-04-25 ENCOUNTER — Encounter (HOSPITAL_COMMUNITY): Payer: Self-pay | Admitting: Emergency Medicine

## 2015-04-25 DIAGNOSIS — K219 Gastro-esophageal reflux disease without esophagitis: Secondary | ICD-10-CM | POA: Diagnosis present

## 2015-04-25 DIAGNOSIS — I2510 Atherosclerotic heart disease of native coronary artery without angina pectoris: Secondary | ICD-10-CM | POA: Diagnosis not present

## 2015-04-25 DIAGNOSIS — F329 Major depressive disorder, single episode, unspecified: Secondary | ICD-10-CM | POA: Diagnosis present

## 2015-04-25 DIAGNOSIS — Z85828 Personal history of other malignant neoplasm of skin: Secondary | ICD-10-CM

## 2015-04-25 DIAGNOSIS — I5042 Chronic combined systolic (congestive) and diastolic (congestive) heart failure: Secondary | ICD-10-CM | POA: Diagnosis present

## 2015-04-25 DIAGNOSIS — Z794 Long term (current) use of insulin: Secondary | ICD-10-CM

## 2015-04-25 DIAGNOSIS — Z66 Do not resuscitate: Secondary | ICD-10-CM | POA: Diagnosis present

## 2015-04-25 DIAGNOSIS — F419 Anxiety disorder, unspecified: Secondary | ICD-10-CM | POA: Diagnosis present

## 2015-04-25 DIAGNOSIS — E785 Hyperlipidemia, unspecified: Secondary | ICD-10-CM | POA: Diagnosis present

## 2015-04-25 DIAGNOSIS — Z951 Presence of aortocoronary bypass graft: Secondary | ICD-10-CM

## 2015-04-25 DIAGNOSIS — R079 Chest pain, unspecified: Secondary | ICD-10-CM | POA: Diagnosis not present

## 2015-04-25 DIAGNOSIS — I255 Ischemic cardiomyopathy: Secondary | ICD-10-CM | POA: Diagnosis present

## 2015-04-25 DIAGNOSIS — Z87891 Personal history of nicotine dependence: Secondary | ICD-10-CM

## 2015-04-25 DIAGNOSIS — M797 Fibromyalgia: Secondary | ICD-10-CM | POA: Diagnosis present

## 2015-04-25 DIAGNOSIS — I482 Chronic atrial fibrillation: Secondary | ICD-10-CM | POA: Diagnosis present

## 2015-04-25 DIAGNOSIS — Z79899 Other long term (current) drug therapy: Secondary | ICD-10-CM

## 2015-04-25 DIAGNOSIS — K449 Diaphragmatic hernia without obstruction or gangrene: Secondary | ICD-10-CM | POA: Diagnosis present

## 2015-04-25 DIAGNOSIS — Z950 Presence of cardiac pacemaker: Secondary | ICD-10-CM

## 2015-04-25 DIAGNOSIS — I110 Hypertensive heart disease with heart failure: Secondary | ICD-10-CM | POA: Diagnosis present

## 2015-04-25 DIAGNOSIS — Z7901 Long term (current) use of anticoagulants: Secondary | ICD-10-CM

## 2015-04-25 LAB — BASIC METABOLIC PANEL
ANION GAP: 6 (ref 5–15)
BUN: 13 mg/dL (ref 6–20)
CALCIUM: 9.1 mg/dL (ref 8.9–10.3)
CO2: 30 mmol/L (ref 22–32)
CREATININE: 1.01 mg/dL (ref 0.61–1.24)
Chloride: 96 mmol/L — ABNORMAL LOW (ref 101–111)
Glucose, Bld: 131 mg/dL — ABNORMAL HIGH (ref 65–99)
Potassium: 4.5 mmol/L (ref 3.5–5.1)
SODIUM: 132 mmol/L — AB (ref 135–145)

## 2015-04-25 LAB — CBC
HCT: 43.5 % (ref 39.0–52.0)
HEMOGLOBIN: 14.5 g/dL (ref 13.0–17.0)
MCH: 30.5 pg (ref 26.0–34.0)
MCHC: 33.3 g/dL (ref 30.0–36.0)
MCV: 91.6 fL (ref 78.0–100.0)
PLATELETS: 153 10*3/uL (ref 150–400)
RBC: 4.75 MIL/uL (ref 4.22–5.81)
RDW: 14 % (ref 11.5–15.5)
WBC: 6.1 10*3/uL (ref 4.0–10.5)

## 2015-04-25 LAB — PROTIME-INR
INR: 2.82 — AB (ref 0.00–1.49)
PROTHROMBIN TIME: 29.2 s — AB (ref 11.6–15.2)

## 2015-04-25 LAB — I-STAT TROPONIN, ED: Troponin i, poc: 0.01 ng/mL (ref 0.00–0.08)

## 2015-04-25 NOTE — ED Notes (Signed)
Pt. reports central chest pain with SOB and nausea onset yesterday , denies diaphoresis , pt. took 3 NTG sl with no relief, his cardiologist is Dr. Mare Ferrari , history of CAD/CHF.

## 2015-04-26 ENCOUNTER — Inpatient Hospital Stay (HOSPITAL_COMMUNITY)
Admission: EM | Admit: 2015-04-26 | Discharge: 2015-04-28 | DRG: 287 | Disposition: A | Discharge status: Home / Self Care | Payer: Medicare Other | Attending: Internal Medicine | Admitting: Internal Medicine

## 2015-04-26 ENCOUNTER — Encounter (HOSPITAL_COMMUNITY): Payer: Self-pay | Admitting: *Deleted

## 2015-04-26 DIAGNOSIS — I4891 Unspecified atrial fibrillation: Secondary | ICD-10-CM | POA: Diagnosis present

## 2015-04-26 DIAGNOSIS — I482 Chronic atrial fibrillation: Secondary | ICD-10-CM

## 2015-04-26 DIAGNOSIS — K219 Gastro-esophageal reflux disease without esophagitis: Secondary | ICD-10-CM | POA: Diagnosis not present

## 2015-04-26 DIAGNOSIS — R079 Chest pain, unspecified: Secondary | ICD-10-CM | POA: Diagnosis present

## 2015-04-26 DIAGNOSIS — I255 Ischemic cardiomyopathy: Secondary | ICD-10-CM | POA: Diagnosis present

## 2015-04-26 DIAGNOSIS — Z951 Presence of aortocoronary bypass graft: Secondary | ICD-10-CM | POA: Diagnosis not present

## 2015-04-26 DIAGNOSIS — E785 Hyperlipidemia, unspecified: Secondary | ICD-10-CM | POA: Diagnosis present

## 2015-04-26 DIAGNOSIS — I2510 Atherosclerotic heart disease of native coronary artery without angina pectoris: Secondary | ICD-10-CM | POA: Diagnosis present

## 2015-04-26 DIAGNOSIS — I200 Unstable angina: Secondary | ICD-10-CM | POA: Diagnosis not present

## 2015-04-26 DIAGNOSIS — I10 Essential (primary) hypertension: Secondary | ICD-10-CM | POA: Diagnosis present

## 2015-04-26 DIAGNOSIS — Z95810 Presence of automatic (implantable) cardiac defibrillator: Secondary | ICD-10-CM | POA: Diagnosis present

## 2015-04-26 LAB — BASIC METABOLIC PANEL
Anion gap: 5 (ref 5–15)
BUN: 15 mg/dL (ref 6–20)
CALCIUM: 8.9 mg/dL (ref 8.9–10.3)
CO2: 31 mmol/L (ref 22–32)
CREATININE: 1.11 mg/dL (ref 0.61–1.24)
Chloride: 97 mmol/L — ABNORMAL LOW (ref 101–111)
GFR calc Af Amer: >60 mL/min (ref >60)
GLUCOSE: 132 mg/dL — AB (ref 65–99)
Potassium: 4.6 mmol/L (ref 3.5–5.1)
Sodium: 133 mmol/L — ABNORMAL LOW (ref 135–145)

## 2015-04-26 LAB — TROPONIN I: Troponin I: <0.03 ng/mL (ref <0.031)

## 2015-04-26 LAB — GLUCOSE, CAPILLARY
Glucose-Capillary: 101 mg/dL — ABNORMAL HIGH (ref 65–99)
Glucose-Capillary: 115 mg/dL — ABNORMAL HIGH (ref 65–99)
Glucose-Capillary: 91 mg/dL (ref 65–99)

## 2015-04-26 MED ORDER — ENOXAPARIN SODIUM 40 MG/0.4ML ~~LOC~~ SOLN: 40.0000 mg | SUBCUTANEOUS | Status: DC

## 2015-04-26 MED ORDER — CARVEDILOL 6.25 MG PO TABS
6.2500 mg | ORAL_TABLET | Freq: Two times a day (BID) | ORAL | Status: DC
  Administered 2015-04-26 – 2015-04-28 (×6): 6.25 mg via ORAL
  Filled 2015-04-26 (×6): qty 1

## 2015-04-26 MED ORDER — ALPRAZOLAM 0.5 MG PO TABS
1.0000 mg | ORAL_TABLET | Freq: Three times a day (TID) | ORAL | Status: DC | PRN
  Administered 2015-04-26 – 2015-04-28 (×6): 1 mg via ORAL
  Filled 2015-04-26 (×6): qty 2

## 2015-04-26 MED ORDER — ROSUVASTATIN CALCIUM 10 MG PO TABS
10.0000 mg | ORAL_TABLET | Freq: Every day | ORAL | Status: DC
Start: 2015-04-26 — End: 2015-04-28
  Administered 2015-04-26 – 2015-04-28 (×3): 10 mg via ORAL
  Filled 2015-04-26 (×3): qty 1

## 2015-04-26 MED ORDER — LISINOPRIL 5 MG PO TABS
5.0000 mg | ORAL_TABLET | Freq: Every day | ORAL | Status: DC
Start: 2015-04-26 — End: 2015-04-28
  Administered 2015-04-26 – 2015-04-28 (×2): 5 mg via ORAL
  Filled 2015-04-26 (×4): qty 1

## 2015-04-26 MED ORDER — MORPHINE SULFATE (PF) 2 MG/ML IV SOLN: 2.0000 mg | INTRAVENOUS | Status: DC | PRN

## 2015-04-26 MED ORDER — DOXEPIN HCL 25 MG PO CAPS
25.0000 mg | ORAL_CAPSULE | Freq: Every day | ORAL | Status: DC
  Administered 2015-04-26 – 2015-04-28 (×3): 25 mg via ORAL
  Filled 2015-04-26 (×3): qty 1

## 2015-04-26 MED ORDER — ONDANSETRON HCL 4 MG/2ML IJ SOLN: 4.0000 mg | Freq: Four times a day (QID) | INTRAMUSCULAR | Status: DC | PRN

## 2015-04-26 MED ORDER — DOXEPIN HCL 10 MG PO CAPS
50.0000 mg | ORAL_CAPSULE | Freq: Every day | ORAL | Status: DC
  Administered 2015-04-26 – 2015-04-27 (×2): 50 mg via ORAL
  Filled 2015-04-26 (×2): qty 5

## 2015-04-26 MED ORDER — DOXEPIN HCL 75 MG PO CAPS
75.0000 mg | ORAL_CAPSULE | Freq: Every day | ORAL | Status: DC
  Filled 2015-04-26: qty 1

## 2015-04-26 MED ORDER — LORATADINE 10 MG PO TABS
10.0000 mg | ORAL_TABLET | Freq: Every day | ORAL | Status: DC
Start: 2015-04-26 — End: 2015-04-28
  Administered 2015-04-26 – 2015-04-28 (×3): 10 mg via ORAL
  Filled 2015-04-26 (×3): qty 1

## 2015-04-26 MED ORDER — MORPHINE SULFATE (PF) 4 MG/ML IV SOLN
4.0000 mg | Freq: Once | INTRAVENOUS | Status: AC
  Administered 2015-04-26: 4 mg via INTRAVENOUS
  Filled 2015-04-26: qty 1

## 2015-04-26 MED ORDER — NITROGLYCERIN 0.4 MG SL SUBL
0.4000 mg | SUBLINGUAL_TABLET | SUBLINGUAL | Status: DC | PRN
Start: 2015-04-26 — End: 2015-04-28

## 2015-04-26 MED ORDER — ASPIRIN 81 MG PO CHEW
324.0000 mg | CHEWABLE_TABLET | Freq: Once | ORAL | Status: AC
  Administered 2015-04-26: 324 mg via ORAL
  Filled 2015-04-26: qty 4

## 2015-04-26 MED ORDER — GI COCKTAIL ~~LOC~~
30.0000 mL | Freq: Four times a day (QID) | ORAL | Status: DC | PRN
Start: 2015-04-26 — End: 2015-04-28
  Administered 2015-04-26 – 2015-04-27 (×3): 30 mL via ORAL
  Filled 2015-04-26 (×3): qty 30

## 2015-04-26 MED ORDER — NITROGLYCERIN 0.4 MG SL SUBL
0.4000 mg | SUBLINGUAL_TABLET | SUBLINGUAL | Status: AC | PRN
  Administered 2015-04-26 (×3): 0.4 mg via SUBLINGUAL
  Filled 2015-04-26 (×2): qty 1

## 2015-04-26 MED ORDER — INSULIN ASPART 100 UNIT/ML ~~LOC~~ SOLN: 0.0000 [IU] | Freq: Three times a day (TID) | SUBCUTANEOUS | Status: DC

## 2015-04-26 MED ORDER — OCUVITE-LUTEIN PO CAPS
1.0000 | ORAL_CAPSULE | Freq: Two times a day (BID) | ORAL | Status: DC
Start: 2015-04-26 — End: 2015-04-28
  Administered 2015-04-26 – 2015-04-28 (×5): 1 via ORAL
  Filled 2015-04-26 (×8): qty 1

## 2015-04-26 MED ORDER — POLYETHYLENE GLYCOL 3350 17 G PO PACK
17.0000 g | PACK | Freq: Every day | ORAL | Status: DC
  Administered 2015-04-26 – 2015-04-27 (×2): 17 g via ORAL
  Filled 2015-04-26 (×2): qty 1

## 2015-04-26 MED ORDER — PANTOPRAZOLE SODIUM 40 MG PO TBEC
40.0000 mg | DELAYED_RELEASE_TABLET | Freq: Every day | ORAL | Status: DC
  Administered 2015-04-26 – 2015-04-28 (×3): 40 mg via ORAL
  Filled 2015-04-26 (×3): qty 1

## 2015-04-26 MED ORDER — ISOSORBIDE MONONITRATE ER 60 MG PO TB24
60.0000 mg | ORAL_TABLET | Freq: Every day | ORAL | Status: DC
  Administered 2015-04-26 – 2015-04-28 (×3): 60 mg via ORAL
  Filled 2015-04-26 (×3): qty 1

## 2015-04-26 MED ORDER — ADULT MULTIVITAMIN W/MINERALS CH
1.0000 | ORAL_TABLET | Freq: Every day | ORAL | Status: DC
Start: 2015-04-26 — End: 2015-04-28
  Administered 2015-04-26 – 2015-04-28 (×3): 1 via ORAL
  Filled 2015-04-26 (×4): qty 1

## 2015-04-26 MED ORDER — GABAPENTIN 100 MG PO CAPS
100.0000 mg | ORAL_CAPSULE | Freq: Four times a day (QID) | ORAL | Status: DC
  Administered 2015-04-26 – 2015-04-28 (×10): 100 mg via ORAL
  Filled 2015-04-26 (×10): qty 1

## 2015-04-26 MED ORDER — ACETAMINOPHEN 500 MG PO TABS
500.0000 mg | ORAL_TABLET | Freq: Three times a day (TID) | ORAL | Status: DC | PRN
  Administered 2015-04-26 – 2015-04-28 (×2): 500 mg via ORAL
  Filled 2015-04-26 (×2): qty 1

## 2015-04-26 NOTE — ED Provider Notes (Signed)
By signing my name below, I, Altamease Oiler, attest that this documentation has been prepared under the direction and in the presence of [place], DO. Electronically Signed: Altamease Oiler, ED Scribe. 04/26/2015. 1:45 AM.  TIME SEEN: 1:11 AM  CHIEF COMPLAINT: Chest pain  HPI: [name] is a 74 y.o. male with PMHx of CAD s/p CABG and pacemaker placement, a-fib, HTN, HLD, a hiatal hernia, CHF who presents to the Emergency Department complaining of intermittent chest pain with onset yesterday while at rest. Pt describes the pain as pressure. Walking exacerbates the pain and rest improves it. Pt states that 3 days ago he had similar pain but with exertion that resolved after NTG. He took 3 NTG last night around 10 PM that improved but did not resolve his pain. Associated symptoms include headache (after NTG), nausea, SOB. Pt denies sweating, dizziness, and vomiting. His last cardiac catheterization was 4 years ago. His last echocardiogram was in February of last year.   His cardiologist is Dr. Mare Ferrari and he sees Dr. Evette Doffing for primary care.   ROS: See HPI Constitutional: no fever  Eyes: no drainage  ENT: no runny nose   Cardiovascular:  chest pain  Resp: no SOB  GI: no vomiting GU: no dysuria Integumentary: no rash  Allergy: no hives  Musculoskeletal: no leg swelling  Neurological: no slurred speech ROS otherwise negative  PAST MEDICAL HISTORY/PAST SURGICAL HISTORY:  Past Medical History  Diagnosis Date  . Ischemic cardiomyopathy     a. 06/2012 EF 25% by echo.  . Hypertension   . Hyperlipidemia   . GERD (gastroesophageal reflux disease)   . CAD (coronary artery disease)     a. 1991 s/p CABGx4;  b. redo CABG in 2007 (SVG->OM, VG->PDA->PLV);  c. 01/2011 Cath: LM 80ost, LAD 100p, LCX 100ost, OM1 100, RCA 100, LIMA->LAD ok, VG->PDA ok, VG->OM ok;  d. 03/2012 MV: EF 32% inf & inflat scar w/o ischemia; e. Lexi MV: EF 33%, inf infarct, no ischemia. f. lexiscan 06/12/2014 RCA  scar, no ischemia  . Atrial flutter ([place])     a. 01/2011 s/p unsuccessful RFCA complicated by CHB req PPM.  . CHB (complete heart block) ([place])     a. 01/2011: in setting of RFCA, s/p SJM 2110 Accent DC PPM, ser # [id number].  [name] Kitchen Kidney cysts     a. bilateral  . Hiatal hernia     a. 03/01/2013 EGD and esoph dil.  [name] Kitchen BPH (benign prostatic hyperplasia)     a. elevated PSA  . A-fib (HCC)     a. chronic coumadin  . Elevated PSA, less than 10 ng/ml   . Fibromyalgia   . Skin cancer     'above left eyebrow; tip of my nose" (08/29/2012)  . Depression     "since 1986-1987" (08/29/2012)  . Anxiety   . Panic attacks   . Chronic systolic CHF (congestive heart failure), NYHA class 2 ([place])     a. 06/2012 Echo: EF 25%, distal sept and apical AK, sev dil LV, mild LVH, mod dil LA.  [name] Kitchen Pacemaker     MEDICATIONS:  Prior to Admission medications   Medication Sig Start Date End Date Taking? Authorizing Provider  acetaminophen (TYLENOL) 500 MG tablet Take 500 mg by mouth every 8 (eight) hours as needed for mild pain.     Historical Provider, MD  ALPRAZolam Duanne Moron) 1 MG tablet Take 1 tablet (1 mg total) by mouth 3 (three) times daily as needed. For anxiety 03/06/15  Darlin Coco, MD  atorvastatin (LIPITOR) 40 MG tablet Take 20 mg by mouth daily at 6 PM.     Historical Provider, MD  carvedilol (COREG) 6.25 MG tablet Take 1 tablet by mouth 2 (two) times daily. 10/15/14   Historical Provider, MD  doxepin (SINEQUAN) 25 MG capsule Take by mouth. Take 25 mg (1 capsule) by mouth in the morning and 50 mg (2 capsules) by mouth at night.    Historical Provider, MD  fluticasone (FLONASE) 50 MCG/ACT nasal spray Place 2 sprays into both nostrils daily. 09/17/14   Tiffany A Gann, PA-C  furosemide (LASIX) 20 MG tablet One tablet by mouth daily as needed for swelling 08/22/14   Darlin Coco, MD  gabapentin (NEURONTIN) 100 MG capsule Take 100 mg by mouth 4 (four) times daily.    Historical Provider, MD  isosorbide mononitrate  (IMDUR) 60 MG 24 hr tablet Take 60 mg by mouth daily.    Historical Provider, MD  JANTOVEN 5 MG tablet TAKE 1 TABLET (5 MG TOTAL) BY MOUTH DAILY. 03/24/15   Eustaquio Maize, MD  lisinopril (PRINIVIL,ZESTRIL) 5 MG tablet 1 tablet by mouth every other day or as directed 10/09/14   Darlin Coco, MD  loratadine (CLARITIN) 10 MG tablet Take 10 mg by mouth daily.    Historical Provider, MD  Multiple Vitamin (MULTIVITAMIN) tablet Take 1 tablet by mouth daily.      Historical Provider, MD  Multiple Vitamins-Minerals (PRESERVISION AREDS 2) CAPS Take 1 capsule by mouth 2 (two) times daily.    Historical Provider, MD  NITROSTAT 0.4 MG SL tablet DISSOLVE ONE TABLET UNDER THE TONGUE EVERY 5 MINUTES AS NEEDED FOR CHEST PAIN.  DO NOT EXCEED A TOTAL OF 3 DOSES IN 15 MINUTES 01/12/15   Darlin Coco, MD  omeprazole (PRILOSEC) 20 MG capsule Take 1 capsule (20 mg total) by mouth 2 (two) times daily. 03/25/13   Darlin Coco, MD  polyethylene glycol [facility] / GLYCOLAX) packet Take 17 g by mouth daily.     Historical Provider, MD  rosuvastatin (CRESTOR) 10 MG tablet Take 1 tablet (10 mg total) by mouth daily. 03/25/15   Darlin Coco, MD    ALLERGIES:  Allergies  Allergen Reactions  . Sulfa Drugs Cross Reactors Other (See Comments)    unknown  . Ultram [Tramadol Hcl] Itching    "don't remember how bad"  . Xarelto [Rivaroxaban] Other (See Comments)    Itching     SOCIAL HISTORY:  Social History  Substance Use Topics  . Smoking status: Former Smoker -- 0.00 packs/day for 0 years    Types: Cigarettes    Quit date: 09/21/1973  . Smokeless tobacco: Never Used  . Alcohol Use: No    FAMILY HISTORY: Family History  Problem Relation Age of Onset  . Stroke Mother   . Heart attack Father     died @ 65, mult MI's.  [name] Kitchen Heart disease Father   . Hypertension Father   . Diabetes Sister     EXAM: BP 143/66 mmHg  Pulse 57  Temp(Src) 97.8 F (36.6 C) (Oral)  Resp 18  Ht 5\' 11"  (1.803 m)  Wt 185  lb (83.915 kg)  BMI 25.81 kg/m2  SpO2 99% CONSTITUTIONAL: Elderly, Alert and oriented and responds appropriately to questions. Well-appearing; well-nourished HEAD: Normocephalic EYES: Conjunctivae clear, PERRL ENT: normal nose; no rhinorrhea; moist mucous membranes; pharynx without lesions noted NECK: Supple, no meningismus, no LAD  CARD: RRR; S1 and S2 appreciated; no murmurs, no clicks, no rubs,  no gallops RESP: Normal chest excursion without splinting or tachypnea; breath sounds clear and equal bilaterally; no wheezes, no rhonchi, no rales, no hypoxia or respiratory distress, speaking full sentences ABD/GI: Normal bowel sounds; non-distended; soft, non-tender, no rebound, no guarding, no peritoneal signs BACK:  The back appears normal and is non-tender to palpation, there is no CVA tenderness EXT: Normal ROM in all joints; non-tender to palpation; no edema; normal capillary refill; no cyanosis, no calf tenderness or swelling    SKIN: Normal color for age and race; warm NEURO: Moves all extremities equally, sensation to light touch intact diffusely, cranial nerves II through XII intact PSYCH: The patient's mood and manner are appropriate. Grooming and personal hygiene are appropriate.  MEDICAL DECISION MAKING: Pt here with chest pain.  Patient has previous history of CAD. We'll obtain cardiac labs. Anticipate admission.  ED PROGRESS: Patient's labs unremarkable. Chest x-ray clear. EKG shows paced rhythm.   1:45 AM-Consult complete with Dr. Tamala Julian (Hosptalist). Patient case explained and discussed. Agrees to admit patient for further evaluation and treatment. Call ended at 1:45 AM    EKG Interpretation  Date/Time:  Saturday April 25 2015 22:55:19 EST Ventricular Rate:  69 PR Interval:    QRS Duration: 200 QT Interval:  464 QTC Calculation: 497 R Axis:   -90 Text Interpretation:  Ventricular-paced rhythm with occasional Premature ventricular complexes Abnormal ECG Confirmed by  Inola Lisle,  DO, Jacy Brocker [id number]) on 04/26/2015 1:19:52 AM        I personally performed the services described in this documentation, which was scribed in my presence. The recorded information has been reviewed and is accurate.      [place], DO 05/14/15 [phone]

## 2015-04-26 NOTE — H&P (Addendum)
[facility] History and Physical  [name] [id number] DOB: [birth year] DOA: 04/26/2015  Referring physician: ED PCP: Eustaquio Maize, MD   Chief Complaint:  Chest pain  HPI:  CAD s/p [id number] &2007) and PM placement, a-fib on anticoagulation therapy, HTN, HLD, a hiatal hernia, systolicCHF last echo in [id number] showing EF of 30%; who presents with intermittent complaints of chest pain. Symptoms initially started 3 days ago (12/23) described a left-sided chest pressure that occurred with exertion. He states he did not take aspirin but did take a nitroglycerin and rest which relieved symptoms. Thereafter reports 2 episodes of chest pain yesterday on 12/24 the last occuring around 10pm that occurred at rest he again tried to take nitroglycerin, but noted no relief from symptoms. Associated symptoms of nausea, no vomiting, and shortness of breath. Given patient's extensive cardiac history is recommended him come in for observation overnight initial troponin 0.01 EKG showing a ventricularly paced rhythm.  Review of Systems  Constitutional: Negative for fever, chills and diaphoresis.  HENT: Positive for hearing loss. Negative for ear discharge.   Eyes: Negative for photophobia and pain.  Respiratory: Positive for cough and shortness of breath. Negative for wheezing.   Cardiovascular: Positive for chest pain. Negative for leg swelling.  Gastrointestinal: Positive for nausea. Negative for vomiting, diarrhea and constipation.  Genitourinary: Negative for dysuria and hematuria.  Musculoskeletal: Positive for joint pain. Negative for falls.  Skin: Negative for itching and rash.  Neurological: Negative for focal weakness, seizures and loss of consciousness.  Endo/Heme/Allergies: Negative for environmental allergies. Bruises/bleeds easily.       Past Medical History  Diagnosis Date  . Ischemic cardiomyopathy     a. 06/2012 EF 25% by echo.  . Hypertension   . Hyperlipidemia   . GERD  (gastroesophageal reflux disease)   . CAD (coronary artery disease)     a. 1991 s/p CABGx4;  b. redo CABG in 2007 (SVG->OM, VG->PDA->PLV);  c. 01/2011 Cath: LM 80ost, LAD 100p, LCX 100ost, OM1 100, RCA 100, LIMA->LAD ok, VG->PDA ok, VG->OM ok;  d. 03/2012 MV: EF 32% inf & inflat scar w/o ischemia; e. Lexi MV: EF 33%, inf infarct, no ischemia. f. lexiscan 06/12/2014 RCA scar, no ischemia  . Atrial flutter ([place])     a. 01/2011 s/p unsuccessful RFCA complicated by CHB req PPM.  . CHB (complete heart block) ([place])     a. 01/2011: in setting of RFCA, s/p SJM 2110 Accent DC PPM, ser # [id number].  [name] Kitchen Kidney cysts     a. bilateral  . Hiatal hernia     a. 03/01/2013 EGD and esoph dil.  [name] Kitchen BPH (benign prostatic hyperplasia)     a. elevated PSA  . A-fib (HCC)     a. chronic coumadin  . Elevated PSA, less than 10 ng/ml   . Fibromyalgia   . Skin cancer     'above left eyebrow; tip of my nose" (08/29/2012)  . Depression     "since 1986-1987" (08/29/2012)  . Anxiety   . Panic attacks   . Chronic systolic CHF (congestive heart failure), NYHA class 2 ([place])     a. 06/2012 Echo: EF 25%, distal sept and apical AK, sev dil LV, mild LVH, mod dil LA.  [name] Kitchen Pacemaker      Past Surgical History  Procedure Laterality Date  . Cardiac catheterization  10/30/2009    Grafts patent. Mild to moderate LV dysfunction. EF 45%. Managed medically.   . Cardiac catheterization      multiple  .  Tonsillectomy  1960  . Cholecystectomy  2007  . Insert / replace / remove pacemaker  01/2011    initial placement  . Inguinal hernia repair  1960's    "left I think"  . Cataract extraction w/ intraocular lens  implant, bilateral  2012  . Cardioversion  05/25/2012    Procedure: CARDIOVERSION;  Surgeon: Darlin Coco, MD;  [facility] ENDOSCOPY;  Service: Cardiovascular;  Laterality: N/A;  . Humerus surgery Left 1960    "growth on the bone taken off; not cancer" (08/29/2012)  . Excisional hemorrhoidectomy  ~ 1980  . Skin cancer  excision      'above left eyebrow; tip of my nose" (08/29/2012)  . Atrial flutter ablation  01/2011    "didn't work" (08/29/2012)  . Coronary artery bypass graft  08/27/1989    CABG X 5  . Coronary artery bypass graft  06/2005    CABG X 3      Social History:  reports that he quit smoking about 41 years ago. His smoking use included Cigarettes. He smoked 0.00 packs per day for 0 years. He has never used smokeless tobacco. He reports that he does not drink alcohol or use illicit drugs.   Allergies  Allergen Reactions  . Sulfa Drugs Cross Reactors Other (See Comments)    unknown  . Ultram [Tramadol Hcl] Itching    "don't remember how bad"  . Xarelto [Rivaroxaban] Other (See Comments)    Itching     Family History  Problem Relation Age of Onset  . Stroke Mother   . Heart attack Father     died @ 51, mult MI's.  [name] Kitchen Heart disease Father   . Hypertension Father   . Diabetes Sister        Prior to Admission medications   Medication Sig Start Date End Date Taking? Authorizing Provider  acetaminophen (TYLENOL) 500 MG tablet Take 500 mg by mouth every 8 (eight) hours as needed for mild pain.    Yes Historical Provider, MD  ALPRAZolam Duanne Moron) 1 MG tablet Take 1 tablet (1 mg total) by mouth 3 (three) times daily as needed. For anxiety 03/06/15  Yes Darlin Coco, MD  carvedilol (COREG) 6.25 MG tablet Take 1 tablet by mouth 2 (two) times daily. 10/15/14  Yes Historical Provider, MD  doxepin (SINEQUAN) 25 MG capsule Take 25-50 mg by mouth 2 (two) times daily. Take 25 mg (1 capsule) by mouth in the morning and 50 mg (2 capsules) by mouth at night.   Yes Historical Provider, MD  furosemide (LASIX) 20 MG tablet One tablet by mouth daily as needed for swelling 08/22/14  Yes Darlin Coco, MD  gabapentin (NEURONTIN) 100 MG capsule Take 100 mg by mouth 4 (four) times daily.   Yes Historical Provider, MD  isosorbide mononitrate (IMDUR) 60 MG 24 hr tablet Take 60 mg by mouth daily.   Yes  Historical Provider, MD  JANTOVEN 5 MG tablet TAKE 1 TABLET (5 MG TOTAL) BY MOUTH DAILY. 03/24/15  Yes Eustaquio Maize, MD  lisinopril (PRINIVIL,ZESTRIL) 5 MG tablet 1 tablet by mouth every other day or as directed 10/09/14  Yes Darlin Coco, MD  loratadine (CLARITIN) 10 MG tablet Take 10 mg by mouth daily.   Yes Historical Provider, MD  Multiple Vitamin (MULTIVITAMIN) tablet Take 1 tablet by mouth daily.     Yes Historical Provider, MD  Multiple Vitamins-Minerals (PRESERVISION AREDS 2) CAPS Take 1 capsule by mouth 2 (two) times daily.   Yes Historical Provider, MD  NITROSTAT 0.4 MG SL tablet DISSOLVE ONE TABLET UNDER THE TONGUE EVERY 5 MINUTES AS NEEDED FOR CHEST PAIN.  DO NOT EXCEED A TOTAL OF 3 DOSES IN 15 MINUTES 01/12/15  Yes Darlin Coco, MD  omeprazole (PRILOSEC) 20 MG capsule Take 1 capsule (20 mg total) by mouth 2 (two) times daily. 03/25/13  Yes Darlin Coco, MD  polyethylene glycol (MIRALAX / GLYCOLAX) packet Take 17 g by mouth daily.    Yes Historical Provider, MD  rosuvastatin (CRESTOR) 10 MG tablet Take 1 tablet (10 mg total) by mouth daily. 03/25/15  Yes Darlin Coco, MD     Physical Exam: Filed Vitals:   04/26/15 0115 04/26/15 0130 04/26/15 0145 04/26/15 0200  BP: 146/76 138/81 132/82 142/75  Pulse: 58 64 60 68  Temp:      TempSrc:      Resp: 13 10 10 16   Height:      Weight:      SpO2: 100% 100% 100% 100%     Constitutional: Vital signs reviewed. Patient is a well-developed and well-nourished in no acute distress and cooperative with exam. Alert and oriented x3.  Head: Normocephalic and atraumatic  Ear: TM normal bilaterally  Mouth: no erythema or exudates, MMM  Eyes: PERRL, EOMI, conjunctivae normal, No scleral icterus.  Neck: Supple, Trachea midline normal ROM, No JVD, mass, thyromegaly, or carotid bruit present.  Cardiovascular: RRR, S1 normal, S2 normal, no MRG, pulses symmetric and intact bilaterally  Pulmonary/Chest: CTAB, no wheezes, rales, or  rhonchi. healed median sternotomy scar Abdominal: Soft. Non-tender, non-distended, bowel sounds are normal, no masses, organomegaly, or guarding present.  GU: no CVA tenderness Musculoskeletal: No joint deformities, erythema, or stiffness, ROM full and no nontender Ext: no edema and no cyanosis, pulses palpable bilaterally (DP and PT)  Hematology: no cervical, inginal, or axillary adenopathy.  Neurological: A&O x3, Strenght is normal and symmetric bilaterally, cranial nerve II-XII are grossly intact, no focal motor deficit, sensory intact to light touch bilaterally.  Skin: Warm, dry and intact. No rash, cyanosis, or clubbing.  Psychiatric: Normal mood and affect. speech and behavior is normal. Judgment and thought content normal. Cognition and memory are normal.      Data Review   Micro Results No results found for this or any previous visit (from the past 240 hour(s)).  Radiology Reports Dg Chest 2 View  04/25/2015  CLINICAL DATA:  Acute onset of generalized chest pain. Initial encounter. EXAM: CHEST  2 VIEW COMPARISON:  Chest radiograph performed 11/27/2014 FINDINGS: The lungs are well-aerated. Mild peribronchial thickening is noted. There is no evidence of focal opacification, pleural effusion or pneumothorax. The heart is borderline normal in size. The patient is status post median sternotomy, with evidence of prior CABG. A pacemaker is noted at the left chest wall, with leads ending at the right atrium and right ventricle. No acute osseous abnormalities are seen. Clips are noted within the right upper quadrant, reflecting prior cholecystectomy. IMPRESSION: 1. No acute cardiopulmonary process seen. 2. Mild peribronchial thickening noted. Electronically Signed   By: Garald Balding M.D.   On: 04/25/2015 23:59     CBC  Recent Labs Lab 04/25/15 2305  WBC 6.1  HGB 14.5  HCT 43.5  PLT 153  MCV 91.6  MCH 30.5  MCHC 33.3  RDW 14.0    Chemistries   Recent Labs Lab 04/25/15 2305   NA 132*  K 4.5  CL 96*  CO2 30  GLUCOSE 131*  BUN 13  CREATININE 1.01  CALCIUM  9.1   ------------------------------------------------------------------------------------------------------------------ estimated creatinine clearance is 68.3 mL/min (by C-G formula based on Cr of 1.01). ------------------------------------------------------------------------------------------------------------------ No results for input(s): HGBA1C in the last 72 hours. ------------------------------------------------------------------------------------------------------------------ No results for input(s): CHOL, HDL, LDLCALC, TRIG, CHOLHDL, LDLDIRECT in the last 72 hours. ------------------------------------------------------------------------------------------------------------------ No results for input(s): TSH, T4TOTAL, T3FREE, THYROIDAB in the last 72 hours.  Invalid input(s): FREET3 ------------------------------------------------------------------------------------------------------------------ No results for input(s): VITAMINB12, FOLATE, FERRITIN, TIBC, IRON, RETICCTPCT in the last 72 hours.  Coagulation profile  Recent Labs Lab 04/25/15 2305  INR 2.82*    No results for input(s): DDIMER in the last 72 hours.  Cardiac Enzymes No results for input(s): CKMB, TROPONINI, MYOGLOBIN in the last 168 hours.  Invalid input(s): CK ------------------------------------------------------------------------------------------------------------------ Invalid input(s): POCBNP   CBG: No results for input(s): GLUCAP in the last 168 hours.     EKG: Independently reviewed. Paced  rhythm  Assessment/Plan Principal Problem:   Chest pain: Patient presents with complaints of chest pain that appeared to have happened at rest and with exertion and given concern for possibility of unstable angina. Patient has significant cardiac history including CABG. Last stress test in  February 2016 was negative.  Patient was seen in September as well for chest pain. Patient initial troponins 0.01. Question if pain is secondary to history of esophageal strictures. -  Admit to a telemetry bed for observation - Trend troponin's 3 -  Repeat EKG in a.m. -  Echocardiogram in a.m. - Consult cardiology for possible need of stress test, patient NPO  Hx of CABG: Patient has had 2 one in 1991 and the second in 2007. Last echocardiogram showing EF 30% in 8 2015 Atrial fibrillation-Permanent on chronic anticoagulation INR 2.82 patient had artery taken his night dose of Coumadin prior to arrival  Hyponatremia: Likely secondary to patient's Lasix. - Held Lasix as question of how this is most be given. Appears to be written as a when necessary for swelling  Hypertension  -Continue lisinopril, Coreg   Hyperlipidemia - Continue Crestor    GERD (gastroesophageal reflux disease)  - Pharmacy substitution for Prilosec with Protonix   Pacemaker-stJudes: Stable    Code Status:   full Family Communication: bedside Disposition Plan: admit   Total time spent 55 minutes.Greater than 50% of this time was spent in counseling, explanation of diagnosis, planning of further management, and coordination of care  [facility] Pager [phone]  If 7PM-7AM, please contact night-coverage [email or web address] Password [facility] 04/26/2015, 2:34 AM

## 2015-04-26 NOTE — ED Notes (Signed)
Dr. Smith at bedside at this time.

## 2015-04-26 NOTE — ED Notes (Signed)
Pt st's he started having central chest pain 2 days ago but thought it was his hernia.  St's chest pain returned last pm with nausea.  Pt rates pain a #7 on pain scale at this time

## 2015-04-26 NOTE — ED Notes (Signed)
Pt c/o throat and epigastric pain - reports hx hiatal hernia and esophageal stretching procedures. States the pain he is experiencing now feels similar to the pain caused by these issues and is unsure if this is different.

## 2015-04-26 NOTE — Progress Notes (Signed)
[facility]  History reviewed in detail and plan discussed with patient/ Patient admitted by my partner this AM and evaluated by myself later in the AM. No recurrence of chest pain. Due to h/o CAD, consulted cards who plan for cath once INR < 1.5. ECHO pending as well.   Debbe Odea, MD

## 2015-04-26 NOTE — Consult Note (Addendum)
CARDIOLOGY CONSULT NOTE   Patient ID: [name] MRN: [id number], DOB/AGE: 74-Nov-1942   Admit date: 04/26/2015 Date of Consult: 04/26/2015   Primary Physician: Eustaquio Maize, MD Primary Cardiologist: Mare Ferrari  HPI   The patient is a 74 year old male with significant history of ischemic heart disease status post remote CABG x4 in 1991 with redo CABG 2007 (SVG->OM, VG->PDA->PLV), ICM with baseline EF 30% on echo 2015, hypertension, HLD, history of atrial flutter s/p unsuccessful RFCA in Sept 0000000 complicated by CHB requiring St Jude PPM and history of atrial fibrillation on chronic Coumadin. Her last cardiac catheterization in October 2012 showed patent grafts. Last stress test was obtained on 06/12/2014 which showed EF 26%, akinesis in the inferior and inferolateral wall, high risk pharmacologic study with large-sized fixed defect in the entire inferior basal and mid inferolateral apical lateral wall, no ischemia. He has a history of intolerance to Xarelto to itching. Patient presented to [facility] on 04/26/2015 with complaint of chest pain.  He is a difficult historian. He says he has a hiatal hernia and esophageal stricture and is scheduled for repeat evaluation on Wednesday at [facility] for possible repeat dilation. On Friday developed chest pain while walking in his house. Took Maalox and NTG and sat down. Pain relieved in about an hour. On Saturday had two more episodes of CP while at rest. Took NTG with some relief. Episodes not associated with eating.  He also has some cough and shortness of breath. Initial troponin was negative. INR was therapeutic. Chest x-ray was negative for acute process. EKG shows ventricularly paced rhythm.     Problem List  Past Medical History  Diagnosis Date  . Ischemic cardiomyopathy     a. 06/2012 EF 25% by echo.  . Hypertension   . Hyperlipidemia   . GERD (gastroesophageal reflux disease)   . CAD (coronary artery disease)     a. 1991  s/p CABGx4;  b. redo CABG in 2007 (SVG->OM, VG->PDA->PLV);  c. 01/2011 Cath: LM 80ost, LAD 100p, LCX 100ost, OM1 100, RCA 100, LIMA->LAD ok, VG->PDA ok, VG->OM ok;  d. 03/2012 MV: EF 32% inf & inflat scar w/o ischemia; e. Lexi MV: EF 33%, inf infarct, no ischemia. f. lexiscan 06/12/2014 RCA scar, no ischemia  . Atrial flutter ([place])     a. 01/2011 s/p unsuccessful RFCA complicated by CHB req PPM.  . CHB (complete heart block) ([place])     a. 01/2011: in setting of RFCA, s/p SJM 2110 [employer] DC PPM, ser # [id number].  [name] Kitchen Kidney cysts     a. bilateral  . Hiatal hernia     a. 03/01/2013 EGD and esoph dil.  [name] Kitchen BPH (benign prostatic hyperplasia)     a. elevated PSA  . A-fib (HCC)     a. chronic coumadin  . Elevated PSA, less than 10 ng/ml   . Fibromyalgia   . Skin cancer     'above left eyebrow; tip of my nose" (08/29/2012)  . Depression     "since 1986-1987" (08/29/2012)  . Anxiety   . Panic attacks   . Chronic systolic CHF (congestive heart failure), NYHA class 2 ([place])     a. 06/2012 Echo: EF 25%, distal sept and apical AK, sev dil LV, mild LVH, mod dil LA.  [name] Kitchen Pacemaker     Past Surgical History  Procedure Laterality Date  . Cardiac catheterization  10/30/2009    Grafts patent. Mild to moderate LV dysfunction. EF 45%. Managed medically.   . Cardiac  catheterization      multiple  . Tonsillectomy  1960  . Cholecystectomy  08-08-2005  . Insert / replace / remove pacemaker  01/2011    initial placement  . Inguinal hernia repair  1960's    "left I think"  . Cataract extraction w/ intraocular lens  implant, bilateral  2010-08-09  . Cardioversion  05/25/2012    Procedure: CARDIOVERSION;  Surgeon: Darlin Coco, MD;  Location: [facility] ENDOSCOPY;  Service: Cardiovascular;  Laterality: N/A;  . Humerus surgery Left 1960    "growth on the bone taken off; not cancer" (08/29/2012)  . Excisional hemorrhoidectomy  ~ 1980  . Skin cancer excision      'above left eyebrow; tip of my nose" (08/29/2012)  . Atrial flutter ablation   01/2011    "didn't work" (08/29/2012)  . Coronary artery bypass graft  08/27/1989    CABG X 5  . Coronary artery bypass graft  06/2005    CABG X 3     Allergies  Allergies  Allergen Reactions  . Sulfa Drugs Cross Reactors Other (See Comments)    unknown  . Ultram [Tramadol Hcl] Itching    "don't remember how bad"  . Xarelto [Rivaroxaban] Other (See Comments)    Itching      Inpatient Medications  . carvedilol  6.25 mg Oral BID WC  . doxepin  25 mg Oral Daily  . doxepin  50 mg Oral QHS  . gabapentin  100 mg Oral QID  . insulin aspart  0-9 Units Subcutaneous TID WC  . isosorbide mononitrate  60 mg Oral Daily  . lisinopril  5 mg Oral Daily  . loratadine  10 mg Oral Daily  . multivitamin with minerals  1 tablet Oral Daily  . multivitamin-lutein  1 capsule Oral BID  . pantoprazole  40 mg Oral Daily  . polyethylene glycol  17 g Oral Daily  . rosuvastatin  10 mg Oral q1800    Family History Family History  Problem Relation Age of Onset  . Stroke Mother   . Heart attack Father     died @ 57, mult MI's.  [name] Kitchen Heart disease Father   . Hypertension Father   . Diabetes Sister      Social History Social History   Social History  . Marital Status: Widowed    Spouse Name: N/A  . Number of Children: 2  . Years of Education: 12   Occupational History  . Not on file.   Social History Main Topics  . Smoking status: Former Smoker -- 0.00 packs/day for 0 years    Types: Cigarettes    Quit date: 09/21/1973  . Smokeless tobacco: Never Used  . Alcohol Use: No  . Drug Use: No  . Sexual Activity: No   Other Topics Concern  . Not on file   Social History Narrative   Wife passed away in 08-09-11. Lives in [place] with his daughter and her family.  Retired from [employer].     Review of Systems  General:  No chills, fever, night sweats or weight changes.  Cardiovascular:  No chest pain, dyspnea on exertion, edema, orthopnea, palpitations, paroxysmal nocturnal  dyspnea. Dermatological: No rash, lesions/masses Respiratory: No cough, dyspnea Urologic: No hematuria, dysuria Abdominal:   No nausea, vomiting, diarrhea, bright red blood per rectum, melena, or hematemesis Neurologic:  No visual changes, wkns, changes in mental status. All other systems reviewed and are otherwise negative except as noted above.  Physical Exam  Blood pressure 155/73,  pulse 65, temperature 97.7 F (36.5 C), temperature source Oral, resp. rate 14, height 5\' 11"  (1.803 m), weight 181 lb 9.6 oz (82.373 kg), SpO2 100 %.  General: Pleasant, NAD Psych: Normal affect. Neuro: Alert and oriented X 3. Moves all extremities spontaneously. HEENT: Normal  Neck: Supple without bruits or JVD. Lungs:  Resp regular and unlabored, CTA. Heart: RRR no s3, s4, 2/6 SEM at base of left sternum  Abdomen: Soft, non-tender, non-distended, BS + x 4.  Extremities: No clubbing, cyanosis or edema. DP/PT/Radials 2+ and equal bilaterally.  Labs   Recent Labs  04/26/15 0657  TROPONINI <0.03   Lab Results  Component Value Date   WBC 6.1 04/25/2015   HGB 14.5 04/25/2015   HCT 43.5 04/25/2015   MCV 91.6 04/25/2015   PLT 153 04/25/2015    Recent Labs Lab 04/26/15 0657  NA 133*  K 4.6  CL 97*  CO2 31  BUN 15  CREATININE 1.11  CALCIUM 8.9  GLUCOSE 132*   Lab Results  Component Value Date   CHOL 131 03/25/2015   HDL 42 03/25/2015   LDLCALC 73 03/25/2015   TRIG 82 03/25/2015   No results found for: DDIMER  Radiology/Studies  Dg Chest 2 View  04/25/2015  CLINICAL DATA:  Acute onset of generalized chest pain. Initial encounter. EXAM: CHEST  2 VIEW COMPARISON:  Chest radiograph performed 11/27/2014 FINDINGS: The lungs are well-aerated. Mild peribronchial thickening is noted. There is no evidence of focal opacification, pleural effusion or pneumothorax. The heart is borderline normal in size. The patient is status post median sternotomy, with evidence of prior CABG. A pacemaker is  noted at the left chest wall, with leads ending at the right atrium and right ventricle. No acute osseous abnormalities are seen. Clips are noted within the right upper quadrant, reflecting prior cholecystectomy. IMPRESSION: 1. No acute cardiopulmonary process seen. 2. Mild peribronchial thickening noted. Electronically Signed   By: Garald Balding M.D.   On: 04/25/2015 23:59    ECG  AF with v-pacing at [place]  1. Chest pain, concerning for [place]   --Myoview 2/16 as per HPI 2. CAD s/p remote CABG x4 in 1991 with redo CABG 2007 (SVG->OM, VG->PDA->PLV) 3. history of atrial flutter s/p unsuccessful RFCA in Sept 0000000 complicated by CHB requiring St Jude PPM and history of atrial fibrillation on chronic Coumadin 4. ICM with baseline EF 30% on echo 2015 5. Hypertension 6. HLD   See MD recommendations below.  Hilbert Corrigan, PA-C 04/26/2015, 9:00 AM   Patient seen and examined with Almyra Deforest, PA-C. We discussed all aspects of the encounter. I agree with the assessment and plan as stated above.  Patient is somewhat of a difficult historian but recurrent CP with relief with NTG concerns me for possible [place]. He has severe CAD with re-do CABG and has not had cath in almost 5 years. Will hold coumadin and proceed with cath when INR < 1.7. Continue to monitor on tele.   Check echo to f/u on MR murmur.   Bensimhon, Daniel,MD 9:19 AM

## 2015-04-27 ENCOUNTER — Observation Stay (HOSPITAL_BASED_OUTPATIENT_CLINIC_OR_DEPARTMENT_OTHER): Payer: Medicare Other

## 2015-04-27 DIAGNOSIS — I5022 Chronic systolic (congestive) heart failure: Secondary | ICD-10-CM | POA: Diagnosis not present

## 2015-04-27 DIAGNOSIS — I110 Hypertensive heart disease with heart failure: Secondary | ICD-10-CM | POA: Diagnosis not present

## 2015-04-27 DIAGNOSIS — I10 Essential (primary) hypertension: Secondary | ICD-10-CM

## 2015-04-27 DIAGNOSIS — Z79899 Other long term (current) drug therapy: Secondary | ICD-10-CM | POA: Diagnosis not present

## 2015-04-27 DIAGNOSIS — Z85828 Personal history of other malignant neoplasm of skin: Secondary | ICD-10-CM | POA: Diagnosis not present

## 2015-04-27 DIAGNOSIS — F329 Major depressive disorder, single episode, unspecified: Secondary | ICD-10-CM | POA: Diagnosis present

## 2015-04-27 DIAGNOSIS — I480 Paroxysmal atrial fibrillation: Secondary | ICD-10-CM | POA: Diagnosis not present

## 2015-04-27 DIAGNOSIS — I5042 Chronic combined systolic (congestive) and diastolic (congestive) heart failure: Secondary | ICD-10-CM | POA: Diagnosis not present

## 2015-04-27 DIAGNOSIS — I2510 Atherosclerotic heart disease of native coronary artery without angina pectoris: Secondary | ICD-10-CM | POA: Diagnosis not present

## 2015-04-27 DIAGNOSIS — K219 Gastro-esophageal reflux disease without esophagitis: Secondary | ICD-10-CM | POA: Diagnosis present

## 2015-04-27 DIAGNOSIS — I25119 Atherosclerotic heart disease of native coronary artery with unspecified angina pectoris: Secondary | ICD-10-CM | POA: Diagnosis not present

## 2015-04-27 DIAGNOSIS — K449 Diaphragmatic hernia without obstruction or gangrene: Secondary | ICD-10-CM | POA: Diagnosis present

## 2015-04-27 DIAGNOSIS — I255 Ischemic cardiomyopathy: Secondary | ICD-10-CM | POA: Diagnosis present

## 2015-04-27 DIAGNOSIS — M797 Fibromyalgia: Secondary | ICD-10-CM | POA: Diagnosis present

## 2015-04-27 DIAGNOSIS — R079 Chest pain, unspecified: Secondary | ICD-10-CM

## 2015-04-27 DIAGNOSIS — Z87891 Personal history of nicotine dependence: Secondary | ICD-10-CM | POA: Diagnosis not present

## 2015-04-27 DIAGNOSIS — Z794 Long term (current) use of insulin: Secondary | ICD-10-CM | POA: Diagnosis not present

## 2015-04-27 DIAGNOSIS — Z7901 Long term (current) use of anticoagulants: Secondary | ICD-10-CM | POA: Diagnosis not present

## 2015-04-27 DIAGNOSIS — E785 Hyperlipidemia, unspecified: Secondary | ICD-10-CM | POA: Diagnosis present

## 2015-04-27 DIAGNOSIS — I4891 Unspecified atrial fibrillation: Secondary | ICD-10-CM | POA: Diagnosis not present

## 2015-04-27 DIAGNOSIS — I482 Chronic atrial fibrillation: Secondary | ICD-10-CM | POA: Diagnosis not present

## 2015-04-27 DIAGNOSIS — Z951 Presence of aortocoronary bypass graft: Secondary | ICD-10-CM | POA: Diagnosis not present

## 2015-04-27 DIAGNOSIS — Z950 Presence of cardiac pacemaker: Secondary | ICD-10-CM | POA: Diagnosis not present

## 2015-04-27 DIAGNOSIS — F419 Anxiety disorder, unspecified: Secondary | ICD-10-CM | POA: Diagnosis present

## 2015-04-27 DIAGNOSIS — R011 Cardiac murmur, unspecified: Secondary | ICD-10-CM

## 2015-04-27 DIAGNOSIS — Z66 Do not resuscitate: Secondary | ICD-10-CM | POA: Diagnosis present

## 2015-04-27 LAB — GLUCOSE, CAPILLARY
GLUCOSE-CAPILLARY: 84 mg/dL (ref 65–99)
Glucose-Capillary: 82 mg/dL (ref 65–99)

## 2015-04-27 LAB — PROTIME-INR
INR: 2.61 — AB (ref 0.00–1.49)
Prothrombin Time: 27.6 seconds — ABNORMAL HIGH (ref 11.6–15.2)

## 2015-04-27 MED ORDER — POLYVINYL ALCOHOL 1.4 % OP SOLN
1.0000 [drp] | OPHTHALMIC | Status: DC | PRN
  Administered 2015-04-27: 1 [drp] via OPHTHALMIC
  Filled 2015-04-27: qty 15

## 2015-04-27 MED ORDER — ASPIRIN 81 MG PO CHEW
81.0000 mg | CHEWABLE_TABLET | Freq: Every day | ORAL | Status: DC
  Administered 2015-04-27: 81 mg via ORAL
  Filled 2015-04-27: qty 1

## 2015-04-27 MED ORDER — VITAMIN K1 10 MG/ML IJ SOLN
1.0000 mg | Freq: Once | INTRAMUSCULAR | Status: AC
  Administered 2015-04-27: 1 mg via SUBCUTANEOUS
  Filled 2015-04-27: qty 0.1

## 2015-04-27 NOTE — Progress Notes (Addendum)
ANTICOAGULATION CONSULT NOTE - Initial Consult  Pharmacy Consult for heparin  Indication: atrial fibrillation  Allergies  Allergen Reactions  . Sulfa Drugs Cross Reactors Other (See Comments)    unknown  . Ultram [Tramadol Hcl] Itching    "don't remember how bad"  . Xarelto [Rivaroxaban] Other (See Comments)    Itching     Patient Measurements: Height: 5\' 11"  (180.3 cm) Weight: 181 lb (82.101 kg) IBW/kg (Calculated) : 75.3 Heparin Dosing Weight: 82kg  Vital Signs: Temp: 98.2 F (36.8 C) (12/26 0600) Temp Source: Oral (12/26 0600) BP: 123/61 mmHg (12/26 0600) Pulse Rate: 61 (12/26 0600)  Labs:  Recent Labs  04/25/15 2305 04/26/15 0657 04/26/15 0807 04/26/15 1224  HGB 14.5  --   --   --   HCT 43.5  --   --   --   PLT 153  --   --   --   LABPROT 29.2*  --   --   --   INR 2.82*  --   --   --   CREATININE 1.01 1.11  --   --   TROPONINI  --  <0.03 <0.03 <0.03    Estimated Creatinine Clearance: 62.2 mL/min (by C-G formula based on Cr of 1.11).   Medical History: Past Medical History  Diagnosis Date  . Ischemic cardiomyopathy     a. 06/2012 EF 25% by echo.  . Hypertension   . Hyperlipidemia   . GERD (gastroesophageal reflux disease)   . CAD (coronary artery disease)     a. 1991 s/p CABGx4;  b. redo CABG in 2007 (SVG->OM, VG->PDA->PLV);  c. 01/2011 Cath: LM 80ost, LAD 100p, LCX 100ost, OM1 100, RCA 100, LIMA->LAD ok, VG->PDA ok, VG->OM ok;  d. 03/2012 MV: EF 32% inf & inflat scar w/o ischemia; e. Lexi MV: EF 33%, inf infarct, no ischemia. f. lexiscan 06/12/2014 RCA scar, no ischemia  . Atrial flutter ([place])     a. 01/2011 s/p unsuccessful RFCA complicated by CHB req PPM.  . CHB (complete heart block) ([place])     a. 01/2011: in setting of RFCA, s/p SJM 2110 [facility] DC PPM, ser # [id number].  [name] Kitchen Kidney cysts     a. bilateral  . Hiatal hernia     a. 03/01/2013 EGD and esoph dil.  [name] Kitchen BPH (benign prostatic hyperplasia)     a. elevated PSA  . A-fib ([facility])     a. chronic  coumadin  . Elevated PSA, less than 10 ng/ml   . Fibromyalgia   . Skin cancer     'above left eyebrow; tip of my nose" (08/29/2012)  . Depression     "since 1986-1987" (08/29/2012)  . Anxiety   . Panic attacks   . Chronic systolic CHF (congestive heart failure), NYHA class 2 ([place])     a. 06/2012 Echo: EF 25%, distal sept and apical AK, sev dil LV, mild LVH, mod dil LA.  [name] Kitchen Pacemaker     Medications:  Scheduled:  . carvedilol  6.25 mg Oral BID WC  . doxepin  25 mg Oral Daily  . doxepin  50 mg Oral QHS  . gabapentin  100 mg Oral QID  . isosorbide mononitrate  60 mg Oral Daily  . lisinopril  5 mg Oral Daily  . loratadine  10 mg Oral Daily  . multivitamin with minerals  1 tablet Oral Daily  . multivitamin-lutein  1 capsule Oral BID  . pantoprazole  40 mg Oral Daily  . polyethylene glycol  17 g  Oral Daily  . rosuvastatin  10 mg Oral q1800   Infusions:    Assessment: 74 yo who was admitted for CP. He has a hx of afib and has been on coumadin for anticoagulation. INR was 2.82 on 12/24. Plan is to use heparin once it's less than 2. Awaiting cath.   Goal of Therapy:  Heparin level 0.3-0.7 units/ml Monitor platelets by anticoagulation protocol: Yes   Plan:   Hold coumadin Heparin when INR<2 INR today  Onnie Boer, PharmD Pager: [phone] 04/27/2015 8:13 AM  Addendum:  INR came back at 2.61 this AM so will hold heparin until <2. Cards suggested a small dose of vit k to reverse it for cath.   Vit k 1mg  PO x1 Heparin when INR <2  Onnie Boer, PharmD Pager: [phone] 04/27/2015 1:40 PM

## 2015-04-27 NOTE — Progress Notes (Addendum)
[facility] Progress Note   [name]  [id number]  DOB: [birth year]  DOA: 04/26/2015 PCP: Eustaquio Maize, MD  Brief narrative: [name] is a 74 y.o. male with atrial fibrillation on anticoagulation, CAD status post CABG in 2007, status post pacemaker, diastolic and systolic heart failure - EF of 30%, hypertension, hyperlipidemia and a hiatal hernia who presents to the hospital with a complaint of left-sided chest pain which is worse when he exerts himself. It improved with nitroglycerin. He also states that he has esphageal strictures which she gets stretched every year and is due to have this done later this week.   Subjective: Has had no recurrence of pain since being in the hospital  Assessment/Plan: Principal Problem:   Chest pain at rest- h/o CABG -plan for cardiac cath when INR is less than 1.7- cardiology following - on Coreg and Aspirin  Active Problems:   Hypertension -cont Coreg, Imdur and Lisinopril  Permanent AF - on Coumadin which is on hold for Cath - cont Coreg    Hyperlipidemia - Crestor  Chronic systolic and diastolic CHF - Ef [id number] last year - Lisinopril, Coreg- takes lasix PRN at home- repeat ECHO shows EF 99991111, Grade 3 diastolic dysfunction and focal septal hypokinesis (per cards, there is no change from prior ECHO)   Hiatal hernia/ GERD - cont Protonix    Code Status:     Code Status Orders        Start     Ordered   04/26/15 0501  Full code   Continuous     04/26/15 0500    Advance Directive Documentation        Most Recent Value   Type of Advance Directive  Living will   Pre-existing out of facility DNR order (yellow form or pink MOST form)     "MOST" Form in Place?       Family Communication:  Disposition Plan: pending cath DVT prophylaxis:  Coumadin Consultants:Cardiology Procedures: ECHO    Antibiotics: Anti-infectives    None      Objective: Filed Weights   04/25/15 2254 04/26/15 0503 04/27/15  0600  Weight: 83.915 kg (185 lb) 82.373 kg (181 lb 9.6 oz) 82.101 kg (181 lb)    Intake/Output Summary (Last 24 hours) at 04/27/15 1149 Last data filed at 04/27/15 1000  Gross per 24 hour  Intake    960 ml  Output   2350 ml  Net  -1390 ml     Vitals Filed Vitals:   04/26/15 1744 04/26/15 2045 04/27/15 0600 04/27/15 0828  BP: 115/62 119/64 123/61 128/66  Pulse:  63 61 64  Temp:  97.9 F (36.6 C) 98.2 F (36.8 C) 97.9 F (36.6 C)  TempSrc:  Oral Oral Oral  Resp:   16 16  Height:      Weight:   82.101 kg (181 lb)   SpO2:  99% 100% 100%    Exam:  General:  Pt is alert, not in acute distress  HEENT: No icterus, No thrush, oral mucosa moist  Cardiovascular: regular rate and rhythm, S1/S2 No murmur  Respiratory: clear to auscultation bilaterally   Abdomen: Soft, +Bowel sounds, non tender, non distended, no guarding  MSK: No LE edema, cyanosis or clubbing  Data Reviewed: Basic Metabolic Panel:  Recent Labs Lab 04/25/15 2305 04/26/15 0657  NA 132* 133*  K 4.5 4.6  CL 96* 97*  CO2 30 31  GLUCOSE 131* 132*  BUN 13 15  CREATININE 1.01 1.11  CALCIUM  9.1 8.9   Liver Function Tests: No results for input(s): AST, ALT, ALKPHOS, BILITOT, PROT, ALBUMIN in the last 168 hours. No results for input(s): LIPASE, AMYLASE in the last 168 hours. No results for input(s): AMMONIA in the last 168 hours. CBC:  Recent Labs Lab 04/25/15 2305  WBC 6.1  HGB 14.5  HCT 43.5  MCV 91.6  PLT 153   Cardiac Enzymes:  Recent Labs Lab 04/26/15 0657 04/26/15 0807 04/26/15 1224  TROPONINI <0.03 <0.03 <0.03   BNP (last 3 results) No results for input(s): BNP in the last 8760 hours.  ProBNP (last 3 results) No results for input(s): PROBNP in the last 8760 hours.  CBG:  Recent Labs Lab 04/26/15 0747 04/26/15 1148 04/26/15 1637 04/26/15 2124 04/27/15 0734  GLUCAP 101* 115* 91 84 82    No results found for this or any previous visit (from the past 240 hour(s)).    Studies: Dg Chest 2 View  04/25/2015  CLINICAL DATA:  Acute onset of generalized chest pain. Initial encounter. EXAM: CHEST  2 VIEW COMPARISON:  Chest radiograph performed 11/27/2014 FINDINGS: The lungs are well-aerated. Mild peribronchial thickening is noted. There is no evidence of focal opacification, pleural effusion or pneumothorax. The heart is borderline normal in size. The patient is status post median sternotomy, with evidence of prior CABG. A pacemaker is noted at the left chest wall, with leads ending at the right atrium and right ventricle. No acute osseous abnormalities are seen. Clips are noted within the right upper quadrant, reflecting prior cholecystectomy. IMPRESSION: 1. No acute cardiopulmonary process seen. 2. Mild peribronchial thickening noted. Electronically Signed   By: Garald Balding M.D.   On: 04/25/2015 23:59    Scheduled Meds:  Scheduled Meds: . carvedilol  6.25 mg Oral BID WC  . doxepin  25 mg Oral Daily  . doxepin  50 mg Oral QHS  . gabapentin  100 mg Oral QID  . isosorbide mononitrate  60 mg Oral Daily  . lisinopril  5 mg Oral Daily  . loratadine  10 mg Oral Daily  . multivitamin with minerals  1 tablet Oral Daily  . multivitamin-lutein  1 capsule Oral BID  . pantoprazole  40 mg Oral Daily  . polyethylene glycol  17 g Oral Daily  . rosuvastatin  10 mg Oral q1800   Continuous Infusions:   Time spent on care of this patient: 45 min   [place], MD 04/27/2015, 11:49 AM    [facility] Office  [phone] Pager - Text Page per [email or web address] If 7PM-7AM, please contact night-coverage [email or web address]

## 2015-04-27 NOTE — Progress Notes (Signed)
Patient Name: [name] Date of Encounter: 04/27/2015     Principal Problem:   Chest pain at rest Active Problems:   Hypertension   Hx of CABG   Hyperlipidemia   GERD (gastroesophageal reflux disease)   Atrial fibrillation-Permanent   Pacemaker-stJudes   Chest pain    SUBJECTIVE  The patient has not had any further chest discomfort.  Anticipating a left heart catheterization tomorrow if his INR comes down enough INR on 04/25/15 was 2.82.  We will check daily INR.  He may need a small amount of vitamin K. Rhythm is atrial fibrillation with paced ventricular response. Troponins are normal 3  CURRENT MEDS . carvedilol  6.25 mg Oral BID WC  . doxepin  25 mg Oral Daily  . doxepin  50 mg Oral QHS  . gabapentin  100 mg Oral QID  . insulin aspart  0-9 Units Subcutaneous TID WC  . isosorbide mononitrate  60 mg Oral Daily  . lisinopril  5 mg Oral Daily  . loratadine  10 mg Oral Daily  . multivitamin with minerals  1 tablet Oral Daily  . multivitamin-lutein  1 capsule Oral BID  . pantoprazole  40 mg Oral Daily  . polyethylene glycol  17 g Oral Daily  . rosuvastatin  10 mg Oral q1800    OBJECTIVE  Filed Vitals:   04/26/15 1505 04/26/15 1744 04/26/15 2045 04/27/15 0600  BP: 108/58 115/62 119/64 123/61  Pulse: 66  63 61  Temp: 98.1 F (36.7 C)  97.9 F (36.6 C) 98.2 F (36.8 C)  TempSrc: Oral  Oral Oral  Resp:    16  Height:      Weight:    181 lb (82.101 kg)  SpO2: 99%  99% 100%    Intake/Output Summary (Last 24 hours) at 04/27/15 0759 Last data filed at 04/26/15 2300  Gross per 24 hour  Intake    960 ml  Output   1650 ml  Net   -690 ml   Filed Weights   04/25/15 2254 04/26/15 0503 04/27/15 0600  Weight: 185 lb (83.915 kg) 181 lb 9.6 oz (82.373 kg) 181 lb (82.101 kg)    PHYSICAL EXAM  General: Pleasant, NAD. Neuro: Alert and oriented X 3. Moves all extremities spontaneously. Psych: Normal affect. HEENT:  Normal  Neck: Supple without bruits or  JVD. Lungs:  Resp regular and unlabored, CTA. Heart: RRR no s3, s4, grade 2/6 systolic murmur at left sternal edge and apex Abdomen: Soft, non-tender, non-distended, BS + x 4.  Extremities: No clubbing, cyanosis or edema. DP/PT/Radials 2+ and equal bilaterally.  Accessory Clinical Findings  CBC  Recent Labs  04/25/15 2305  WBC 6.1  HGB 14.5  HCT 43.5  MCV 91.6  PLT 0000000   Basic Metabolic Panel  Recent Labs  04/25/15 2305 04/26/15 0657  NA 132* 133*  K 4.5 4.6  CL 96* 97*  CO2 30 31  GLUCOSE 131* 132*  BUN 13 15  CREATININE 1.01 1.11  CALCIUM 9.1 8.9   Liver Function Tests No results for input(s): AST, ALT, ALKPHOS, BILITOT, PROT, ALBUMIN in the last 72 hours. No results for input(s): LIPASE, AMYLASE in the last 72 hours. Cardiac Enzymes  Recent Labs  04/26/15 0657 04/26/15 0807 04/26/15 1224  TROPONINI <0.03 <0.03 <0.03   BNP Invalid input(s): POCBNP D-Dimer No results for input(s): DDIMER in the last 72 hours. Hemoglobin A1C No results for input(s): HGBA1C in the last 72 hours. Fasting Lipid Panel No results for  input(s): CHOL, HDL, LDLCALC, TRIG, CHOLHDL, LDLDIRECT in the last 72 hours. Thyroid Function Tests No results for input(s): TSH, T4TOTAL, T3FREE, THYROIDAB in the last 72 hours.  Invalid input(s): FREET3  TELE  Atrial fibrillation  ECG   Radiology/Studies  Dg Chest 2 View  04/25/2015  CLINICAL DATA:  Acute onset of generalized chest pain. Initial encounter. EXAM: CHEST  2 VIEW COMPARISON:  Chest radiograph performed 11/27/2014 FINDINGS: The lungs are well-aerated. Mild peribronchial thickening is noted. There is no evidence of focal opacification, pleural effusion or pneumothorax. The heart is borderline normal in size. The patient is status post median sternotomy, with evidence of prior CABG. A pacemaker is noted at the left chest wall, with leads ending at the right atrium and right ventricle. No acute osseous abnormalities are seen.  Clips are noted within the right upper quadrant, reflecting prior cholecystectomy. IMPRESSION: 1. No acute cardiopulmonary process seen. 2. Mild peribronchial thickening noted. Electronically Signed   By: Garald Balding M.D.   On: 04/25/2015 23:59    ASSESSMENT AND PLAN  1. Chest pain, concerning for [place].  2. CAD s/p remote CABG x4 in 1991 with redo CABG 2007 (SVG->OM, VG->PDA->PLV) 3. history of atrial flutter s/p unsuccessful RFCA in Sept 0000000 complicated by CHB requiring St Jude PPM and history of atrial fibrillation on chronic Coumadin 4. ICM with baseline EF 30% on echo 2015 5. Hypertension 6. HLD   Plan: Daily INR.  Anticipate cardiac catheterization when INR less than 1.7. Signed, Warren Danes MD

## 2015-04-27 NOTE — Progress Notes (Signed)
*  PRELIMINARY RESULTS* Echocardiogram 2D Echocardiogram has been performed.  Leavy Cella 04/27/2015, 10:14 AM

## 2015-04-28 ENCOUNTER — Encounter (HOSPITAL_COMMUNITY)
Admission: EM | Disposition: A | Discharge status: Home / Self Care | Payer: Self-pay | Source: Home / Self Care | Attending: Internal Medicine

## 2015-04-28 DIAGNOSIS — I2510 Atherosclerotic heart disease of native coronary artery without angina pectoris: Principal | ICD-10-CM

## 2015-04-28 DIAGNOSIS — I25119 Atherosclerotic heart disease of native coronary artery with unspecified angina pectoris: Secondary | ICD-10-CM

## 2015-04-28 DIAGNOSIS — I255 Ischemic cardiomyopathy: Secondary | ICD-10-CM

## 2015-04-28 DIAGNOSIS — Z950 Presence of cardiac pacemaker: Secondary | ICD-10-CM

## 2015-04-28 DIAGNOSIS — I480 Paroxysmal atrial fibrillation: Secondary | ICD-10-CM

## 2015-04-28 DIAGNOSIS — I5022 Chronic systolic (congestive) heart failure: Secondary | ICD-10-CM

## 2015-04-28 DIAGNOSIS — I4891 Unspecified atrial fibrillation: Secondary | ICD-10-CM

## 2015-04-28 HISTORY — PX: CARDIAC CATHETERIZATION: SHX172

## 2015-04-28 LAB — PROTIME-INR
INR: 1.63 — AB (ref 0.00–1.49)
PROTHROMBIN TIME: 19.4 s — AB (ref 11.6–15.2)

## 2015-04-28 LAB — BASIC METABOLIC PANEL
Anion gap: 6 (ref 5–15)
BUN: 22 mg/dL — AB (ref 6–20)
CALCIUM: 9.1 mg/dL (ref 8.9–10.3)
CO2: 31 mmol/L (ref 22–32)
CREATININE: 1.08 mg/dL (ref 0.61–1.24)
Chloride: 99 mmol/L — ABNORMAL LOW (ref 101–111)
GFR calc Af Amer: >60 mL/min (ref >60)
GLUCOSE: 102 mg/dL — AB (ref 65–99)
POTASSIUM: 4.5 mmol/L (ref 3.5–5.1)
SODIUM: 136 mmol/L (ref 135–145)

## 2015-04-28 LAB — GLUCOSE, CAPILLARY: GLUCOSE-CAPILLARY: 72 mg/dL (ref 65–99)

## 2015-04-28 LAB — POCT ACTIVATED CLOTTING TIME: Activated Clotting Time: 121 seconds

## 2015-04-28 SURGERY — LEFT HEART CATH AND CORS/GRAFTS ANGIOGRAPHY

## 2015-04-28 MED ORDER — IOHEXOL 350 MG/ML SOLN
INTRAVENOUS | Status: DC | PRN
  Administered 2015-04-28: 90 mL via INTRA_ARTERIAL

## 2015-04-28 MED ORDER — ASPIRIN 81 MG PO CHEW
81.0000 mg | CHEWABLE_TABLET | ORAL | Status: AC
  Administered 2015-04-28: 81 mg via ORAL
  Filled 2015-04-28: qty 1

## 2015-04-28 MED ORDER — LIDOCAINE HCL (PF) 1 % IJ SOLN
INTRAMUSCULAR | Status: DC | PRN
  Administered 2015-04-28: 15:00:00

## 2015-04-28 MED ORDER — WARFARIN SODIUM 7.5 MG PO TABS
7.5000 mg | ORAL_TABLET | Freq: Once | ORAL | Status: AC
  Administered 2015-04-28: 7.5 mg via ORAL
  Filled 2015-04-28: qty 1

## 2015-04-28 MED ORDER — ASPIRIN 81 MG PO CHEW: 81.0000 mg | CHEWABLE_TABLET | Freq: Every day | ORAL | Status: DC

## 2015-04-28 MED ORDER — HEPARIN (PORCINE) IN NACL 100-0.45 UNIT/ML-% IJ SOLN
1100.0000 [IU]/h | INTRAMUSCULAR | Status: DC
Start: 2015-04-28 — End: 2015-04-28

## 2015-04-28 MED ORDER — SODIUM CHLORIDE 0.9 % IJ SOLN: 3.0000 mL | Freq: Two times a day (BID) | INTRAMUSCULAR | Status: DC

## 2015-04-28 MED ORDER — HEPARIN (PORCINE) IN NACL 100-0.45 UNIT/ML-% IJ SOLN
1100.0000 [IU]/h | INTRAMUSCULAR | Status: DC
  Administered 2015-04-28: 1100 [IU]/h via INTRAVENOUS
  Filled 2015-04-28: qty 250

## 2015-04-28 MED ORDER — WARFARIN - PHARMACIST DOSING INPATIENT: Freq: Every day | Status: DC

## 2015-04-28 MED ORDER — SODIUM CHLORIDE 0.9 % IJ SOLN: 3.0000 mL | INTRAMUSCULAR | Status: DC | PRN

## 2015-04-28 MED ORDER — MIDAZOLAM HCL 2 MG/2ML IJ SOLN
INTRAMUSCULAR | Status: AC
  Filled 2015-04-28: qty 2

## 2015-04-28 MED ORDER — SODIUM CHLORIDE 0.9 % IV SOLN
INTRAVENOUS | Status: DC
  Administered 2015-04-28: 11:00:00 via INTRAVENOUS

## 2015-04-28 MED ORDER — HEPARIN (PORCINE) IN NACL 2-0.9 UNIT/ML-% IJ SOLN
INTRAMUSCULAR | Status: AC
  Filled 2015-04-28: qty 1000

## 2015-04-28 MED ORDER — LIDOCAINE HCL (PF) 1 % IJ SOLN
INTRAMUSCULAR | Status: AC
  Filled 2015-04-28: qty 30

## 2015-04-28 MED ORDER — SODIUM CHLORIDE 0.9 % IV SOLN: 250.0000 mL | INTRAVENOUS | Status: DC | PRN

## 2015-04-28 MED ORDER — FENTANYL CITRATE (PF) 100 MCG/2ML IJ SOLN
INTRAMUSCULAR | Status: DC | PRN
  Administered 2015-04-28: 50 ug via INTRAVENOUS

## 2015-04-28 MED ORDER — SODIUM CHLORIDE 0.9 % WEIGHT BASED INFUSION
1.0000 mL/kg/h | INTRAVENOUS | Status: DC
  Administered 2015-04-28: 1 mL/kg/h via INTRAVENOUS

## 2015-04-28 MED ORDER — MIDAZOLAM HCL 2 MG/2ML IJ SOLN
INTRAMUSCULAR | Status: DC | PRN
  Administered 2015-04-28 (×2): 1 mg via INTRAVENOUS

## 2015-04-28 MED ORDER — FENTANYL CITRATE (PF) 100 MCG/2ML IJ SOLN
INTRAMUSCULAR | Status: AC
  Filled 2015-04-28: qty 2

## 2015-04-28 SURGICAL SUPPLY — 7 items
CATH INFINITI 5 FR IM (CATHETERS) ×3 IMPLANT
CATH SITESEER 5F MULTI A 2 (CATHETERS) ×3 IMPLANT
KIT HEART LEFT (KITS) ×3 IMPLANT
PACK CARDIAC CATHETERIZATION (CUSTOM PROCEDURE TRAY) ×3 IMPLANT
SHEATH PINNACLE 5F 10CM (SHEATH) ×3 IMPLANT
TRANSDUCER W/STOPCOCK (MISCELLANEOUS) ×3 IMPLANT
WIRE EMERALD 3MM-J .035X150CM (WIRE) ×3 IMPLANT

## 2015-04-28 NOTE — Progress Notes (Addendum)
ANTICOAGULATION CONSULT NOTE - Follow Up Consult  Pharmacy Consult for Heparin and coumadin  Indication: atrial fibrillation   Patient Measurements: Height: 5\' 11"  (180.3 cm) Weight: 181 lb 6.4 oz (82.283 kg) IBW/kg (Calculated) : 75.3   Vital Signs: Temp: 97.5 F (36.4 C) (12/27 0759) Temp Source: Oral (12/27 0759) BP: 130/68 mmHg (12/27 1500) Pulse Rate: 67 (12/27 1500)  Labs:  Recent Labs  04/25/15 2305 04/26/15 0657 04/26/15 0807 04/26/15 1224 04/27/15 1005 04/28/15 0537  HGB 14.5  --   --   --   --   --   HCT 43.5  --   --   --   --   --   PLT 153  --   --   --   --   --   LABPROT 29.2*  --   --   --  27.6* 19.4*  INR 2.82*  --   --   --  2.61* 1.63*  CREATININE 1.01 1.11  --   --   --  1.08  TROPONINI  --  <0.03 <0.03 <0.03  --   --    Assessment: 74 yo male admitted with PMHx of afib on chronic coumadin who presented to ER on 12/25 with chest pain. S/p LHC on 12/27 that showed same anatomy as 2012. Pharmacy consulted to resume heparin for afib 8 hours post sheath removal which occurred 14:30 today. Also to resume coumadin.  PTA dose of coumadin 5 mg daily, last dose on 12/24. INR 1.63 - s/p 1 mg sub vit K on12/26.  Goal of Therapy:  Heparin level 0.3-0.7 units/ml Monitor platelets by anticoagulation protocol: Yes   Plan:  1. Resume heparin at 1100 units/ hr tonight at 22:30  2. HL in 8 hours after restating and daily HL thereafter 3. CBC  Q72H 4. Coumadin 7.5 mg x 1 tonight then likely resume home dose of 5 mg daily  Vincenza Hews, PharmD, BCPS 04/28/2015, 4:28 PM Pager: [phone]

## 2015-04-28 NOTE — Progress Notes (Signed)
[facility] Progress Note   [name]  [id number]  DOB: 07-28-1940  DOA: 04/26/2015 PCP: Eustaquio Maize, MD  Brief narrative: [name] is a 73 y.o. male with atrial fibrillation on anticoagulation, CAD status post CABG in 2007, status post pacemaker, diastolic and systolic heart failure - EF of 30%, hypertension, hyperlipidemia and a hiatal hernia who presents to the hospital with a complaint of left-sided chest pain which is worse when he exerts himself. It improved with nitroglycerin. He also states that he has esphageal strictures which she gets stretched every year and is due to have this done later this week.   Subjective: No complaints. Awaiting cath when evaluated this AM.  Assessment/Plan: Principal Problem:   Chest pain at rest- h/o CABG - cardiology following- cath performed today - on Coreg and Aspirin  Active Problems:   Hypertension -cont Coreg, Imdur and Lisinopril  Permanent AF - on Coumadin which is on hold for Cath-  - cont Coreg    Hyperlipidemia - Crestor  Chronic systolic and diastolic CHF - Ef [id number] last year - Lisinopril, Coreg- takes lasix PRN at home- repeat ECHO shows EF 99991111, Grade 3 diastolic dysfunction and focal septal hypokinesis (per cards, there is no change from prior ECHO)   Hiatal hernia/ GERD - cont Protonix    Code Status:     Code Status Orders        Start     Ordered   04/26/15 0501  Full code   Continuous     04/26/15 0500    Advance Directive Documentation        Most Recent Value   Type of Advance Directive  Living will   Pre-existing out of facility DNR order (yellow form or pink MOST form)     "MOST" Form in Place?       Family Communication:  Disposition Plan: pending cath DVT prophylaxis:  Coumadin Consultants:Cardiology Procedures: ECHO    Antibiotics: Anti-infectives    None      Objective: Filed Weights   04/26/15 0503 04/27/15 0600 04/28/15 0500  Weight: 82.373 kg (181 lb  9.6 oz) 82.101 kg (181 lb) 82.283 kg (181 lb 6.4 oz)    Intake/Output Summary (Last 24 hours) at 04/28/15 1639 Last data filed at 04/28/15 1056  Gross per 24 hour  Intake    120 ml  Output   3125 ml  Net  -3005 ml     Vitals Filed Vitals:   04/28/15 1445 04/28/15 1450 04/28/15 1455 04/28/15 1500  BP: 127/73 134/69 133/71 130/68  Pulse: 59 61 67 67  Temp:      TempSrc:      Resp: 18 13 18 11   Height:      Weight:      SpO2: 100% 100% 100% 100%    Exam:  General:  Pt is alert, not in acute distress  HEENT: No icterus, No thrush, oral mucosa moist  Cardiovascular: regular rate and rhythm, S1/S2 No murmur  Respiratory: clear to auscultation bilaterally   Abdomen: Soft, +Bowel sounds, non tender, non distended, no guarding  MSK: No LE edema, cyanosis or clubbing  Data Reviewed: Basic Metabolic Panel:  Recent Labs Lab 04/25/15 2305 04/26/15 0657 04/28/15 0537  NA 132* 133* 136  K 4.5 4.6 4.5  CL 96* 97* 99*  CO2 30 31 31   GLUCOSE 131* 132* 102*  BUN 13 15 22*  CREATININE 1.01 1.11 1.08  CALCIUM 9.1 8.9 9.1   Liver Function Tests: No  results for input(s): AST, ALT, ALKPHOS, BILITOT, PROT, ALBUMIN in the last 168 hours. No results for input(s): LIPASE, AMYLASE in the last 168 hours. No results for input(s): AMMONIA in the last 168 hours. CBC:  Recent Labs Lab 04/25/15 2305  WBC 6.1  HGB 14.5  HCT 43.5  MCV 91.6  PLT 153   Cardiac Enzymes:  Recent Labs Lab 04/26/15 0657 04/26/15 0807 04/26/15 1224  TROPONINI <0.03 <0.03 <0.03   BNP (last 3 results) No results for input(s): BNP in the last 8760 hours.  ProBNP (last 3 results) No results for input(s): PROBNP in the last 8760 hours.  CBG:  Recent Labs Lab 04/26/15 1148 04/26/15 1637 04/26/15 2124 04/27/15 0734 04/28/15 1508  GLUCAP 115* 91 84 82 72    No results found for this or any previous visit (from the past 240 hour(s)).   Studies: No results found.  Scheduled  Meds:  Scheduled Meds: . [START ON 04/29/2015] aspirin  81 mg Oral Daily  . aspirin  81 mg Oral Daily  . carvedilol  6.25 mg Oral BID WC  . doxepin  25 mg Oral Daily  . doxepin  50 mg Oral QHS  . gabapentin  100 mg Oral QID  . isosorbide mononitrate  60 mg Oral Daily  . lisinopril  5 mg Oral Daily  . loratadine  10 mg Oral Daily  . multivitamin with minerals  1 tablet Oral Daily  . multivitamin-lutein  1 capsule Oral BID  . pantoprazole  40 mg Oral Daily  . polyethylene glycol  17 g Oral Daily  . rosuvastatin  10 mg Oral q1800  . sodium chloride  3 mL Intravenous Q12H   Continuous Infusions: . sodium chloride    . heparin      Time spent on care of this patient: 35 min   [place], MD 04/28/2015, 4:39 PM  LOS: 1 day   [facility] Office  [phone] Pager - Text Page per [email or web address] If 7PM-7AM, please contact night-coverage [email or web address]

## 2015-04-28 NOTE — Plan of Care (Signed)
Problem: Phase I Progression Outcomes Goal: Aspirin unless contraindicated Outcome: Completed/Met Date Met:  04/28/15 ASA Given

## 2015-04-28 NOTE — Discharge Summary (Signed)
Physician Discharge Summary  [name] DOB: 1940/10/02 DOA: 04/26/2015  PCP: Eustaquio Maize, MD  Admit date: 04/26/2015 Discharge date: 04/28/2015  Time spent: 45 minutes   Discharge Condition: stable    Discharge Diagnoses:  Principal Problem:   Chest pain at rest Active Problems:   Ischemic cardiomyopathy   Hypertension   Hyperlipidemia   GERD (gastroesophageal reflux disease)   Hx of CABG   Atrial fibrillation-Permanent   Pacemaker-stJudes   CAD (coronary artery disease)   Chest pain   Chronic systolic CHF (congestive heart failure) ([place])   History of present illness:  [name] is a 74 y.o. male with atrial fibrillation on anticoagulation, CAD status post CABG in 2007, status post pacemaker, diastolic and systolic heart failure - EF of 30%, hypertension, hyperlipidemia and a hiatal hernia who presents to the hospital with a complaint of left-sided chest pain which is worse when he exerts himself. It improved with nitroglycerin. He also states that he has esphageal strictures which she gets stretched every year and is due to have this done later this week.  Hospital Course:  Principal Problem:  Chest pain at rest- h/o CABG - cardiac cath performed today- no stenosis in grafts noted -   Active Problems:  Hypertension -cont Coreg, Imdur and Lisinopril  Permanent AF - on Coumadin which is was on hold for Cath- will resume today - cont Coreg   Hyperlipidemia - Crestor  Chronic systolic and diastolic CHF - Ef [id number] last year - Lisinopril, Coreg- takes lasix PRN at home- repeat ECHO shows EF 99991111, Grade 3 diastolic dysfunction and focal septal hypokinesis (per cards, there is no change from prior ECHO)  Hiatal hernia/ GERD - cont Protonix    Procedures: 2 D ECHO Cardiac cath  Consultations:  Cardiology   Discharge Exam: Filed Weights   04/26/15 0503 04/27/15 0600 04/28/15 0500  Weight: 82.373 kg (181 lb 9.6 oz) 82.101 kg  (181 lb) 82.283 kg (181 lb 6.4 oz)   Filed Vitals:   04/28/15 1455 04/28/15 1500  BP: 133/71 130/68  Pulse: 67 67  Temp:    Resp: 18 11    General: AAO x 3, no distress Cardiovascular: RRR, no murmurs  Respiratory: clear to auscultation bilaterally GI: soft, non-tender, non-distended, bowel sound positive  Discharge Instructions You were cared for by a hospitalist during your hospital stay. If you have any questions about your discharge medications or the care you received while you were in the hospital after you are discharged, you can call the unit and asked to speak with the hospitalist on call if the hospitalist that took care of you is not available. Once you are discharged, your primary care physician will handle any further medical issues. Please note that NO REFILLS for any discharge medications will be authorized once you are discharged, as it is imperative that you return to your primary care physician (or establish a relationship with a primary care physician if you do not have one) for your aftercare needs so that they can reassess your need for medications and monitor your lab values.      Discharge Instructions    Diet - low sodium heart healthy    Complete by:  As directed      Increase activity slowly    Complete by:  As directed             Medication List    TAKE these medications        acetaminophen 500 MG  tablet  Commonly known as:  TYLENOL  Take 500 mg by mouth every 8 (eight) hours as needed for mild pain.     ALPRAZolam 1 MG tablet  Commonly known as:  XANAX  Take 1 tablet (1 mg total) by mouth 3 (three) times daily as needed. For anxiety     carvedilol 6.25 MG tablet  Commonly known as:  COREG  Take 1 tablet by mouth 2 (two) times daily.     doxepin 25 MG capsule  Commonly known as:  SINEQUAN  Take 25-50 mg by mouth 2 (two) times daily. Take 25 mg (1 capsule) by mouth in the morning and 50 mg (2 capsules) by mouth at night.     furosemide 20  MG tablet  Commonly known as:  LASIX  One tablet by mouth daily as needed for swelling     gabapentin 100 MG capsule  Commonly known as:  NEURONTIN  Take 100 mg by mouth 4 (four) times daily.     isosorbide mononitrate 60 MG 24 hr tablet  Commonly known as:  IMDUR  Take 60 mg by mouth daily.     JANTOVEN 5 MG tablet  Generic drug:  warfarin  TAKE 1 TABLET (5 MG TOTAL) BY MOUTH DAILY.     lisinopril 5 MG tablet  Commonly known as:  PRINIVIL,ZESTRIL  1 tablet by mouth every other day or as directed     loratadine 10 MG tablet  Commonly known as:  CLARITIN  Take 10 mg by mouth daily.     multivitamin tablet  Take 1 tablet by mouth daily.     NITROSTAT 0.4 MG SL tablet  Generic drug:  nitroGLYCERIN  DISSOLVE ONE TABLET UNDER THE TONGUE EVERY 5 MINUTES AS NEEDED FOR CHEST PAIN.  DO NOT EXCEED A TOTAL OF 3 DOSES IN 15 MINUTES     omeprazole 20 MG capsule  Commonly known as:  PRILOSEC  Take 1 capsule (20 mg total) by mouth 2 (two) times daily.     polyethylene glycol packet  Commonly known as:  MIRALAX / GLYCOLAX  Take 17 g by mouth daily.     PRESERVISION AREDS 2 Caps  Take 1 capsule by mouth 2 (two) times daily.     rosuvastatin 10 MG tablet  Commonly known as:  CRESTOR  Take 1 tablet (10 mg total) by mouth daily.       Allergies  Allergen Reactions  . Sulfa Drugs Cross Reactors Other (See Comments)    unknown  . Ultram [Tramadol Hcl] Itching    "don't remember how bad"  . Xarelto [Rivaroxaban] Other (See Comments)    Itching       The results of significant diagnostics from this hospitalization (including imaging, microbiology, ancillary and laboratory) are listed below for reference.    Significant Diagnostic Studies: Dg Chest 2 View  04/25/2015  CLINICAL DATA:  Acute onset of generalized chest pain. Initial encounter. EXAM: CHEST  2 VIEW COMPARISON:  Chest radiograph performed 11/27/2014 FINDINGS: The lungs are well-aerated. Mild peribronchial thickening  is noted. There is no evidence of focal opacification, pleural effusion or pneumothorax. The heart is borderline normal in size. The patient is status post median sternotomy, with evidence of prior CABG. A pacemaker is noted at the left chest wall, with leads ending at the right atrium and right ventricle. No acute osseous abnormalities are seen. Clips are noted within the right upper quadrant, reflecting prior cholecystectomy. IMPRESSION: 1. No acute cardiopulmonary process seen. 2. Mild  peribronchial thickening noted. Electronically Signed   By: Garald Balding M.D.   On: 04/25/2015 23:59    Microbiology: No results found for this or any previous visit (from the past 240 hour(s)).   Labs: Basic Metabolic Panel:  Recent Labs Lab 04/25/15 2305 04/26/15 0657 04/28/15 0537  NA 132* 133* 136  K 4.5 4.6 4.5  CL 96* 97* 99*  CO2 30 31 31   GLUCOSE 131* 132* 102*  BUN 13 15 22*  CREATININE 1.01 1.11 1.08  CALCIUM 9.1 8.9 9.1   Liver Function Tests: No results for input(s): AST, ALT, ALKPHOS, BILITOT, PROT, ALBUMIN in the last 168 hours. No results for input(s): LIPASE, AMYLASE in the last 168 hours. No results for input(s): AMMONIA in the last 168 hours. CBC:  Recent Labs Lab 04/25/15 2305  WBC 6.1  HGB 14.5  HCT 43.5  MCV 91.6  PLT 153   Cardiac Enzymes:  Recent Labs Lab 04/26/15 0657 04/26/15 0807 04/26/15 1224  TROPONINI <0.03 <0.03 <0.03   BNP: BNP (last 3 results) No results for input(s): BNP in the last 8760 hours.  ProBNP (last 3 results) No results for input(s): PROBNP in the last 8760 hours.  CBG:  Recent Labs Lab 04/26/15 1148 04/26/15 1637 04/26/15 2124 04/27/15 0734 04/28/15 1508  GLUCAP 115* 91 84 82 72       SignedDebbe Odea, MD [facility] 04/28/2015, 4:38 PM

## 2015-04-28 NOTE — Progress Notes (Signed)
Pt educated on the importance of taking home medications, and daily weights, and exercising. Son at bedside for discharge instructions, will discuss extensively

## 2015-04-28 NOTE — Progress Notes (Signed)
ANTICOAGULATION CONSULT NOTE - Follow Up Consult  Pharmacy Consult for heparin Indication: atrial fibrillation   Labs:  Recent Labs  04/25/15 2305 04/26/15 0657 04/26/15 0807 04/26/15 1224 04/27/15 1005 04/28/15 0537  HGB 14.5  --   --   --   --   --   HCT 43.5  --   --   --   --   --   PLT 153  --   --   --   --   --   LABPROT 29.2*  --   --   --  27.6* 19.4*  INR 2.82*  --   --   --  2.61* 1.63*  CREATININE 1.01 1.11  --   --   --  1.08  TROPONINI  --  <0.03 <0.03 <0.03  --   --     Assessment: 74yo male w/ Coumadin on hold, INR now below 2, to begin heparin bridge.  Goal of Therapy:  Heparin level 0.3-0.7 units/ml Monitor platelets by anticoagulation protocol: Yes   Plan:  Will start heparin gtt at 1100 units/hr and check level in [place], PharmD, BCPS  04/28/2015,7:02 AM

## 2015-04-28 NOTE — Interval H&P Note (Signed)
Cath Lab Visit (complete for each Cath Lab visit)  Clinical Evaluation Leading to the Procedure:   ACS: Yes.    Non-ACS:    Anginal Classification: CCS III  Anti-ischemic medical therapy: Maximal Therapy (2 or more classes of medications)  Non-Invasive Test Results: No non-invasive testing performed  Prior CABG: Previous CABG      History and Physical Interval Note:  04/28/2015 1:40 PM  [name]  has presented today for surgery, with the diagnosis of cp  The various methods of treatment have been discussed with the patient and family. After consideration of risks, benefits and other options for treatment, the patient has consented to  Procedure(s): Left Heart Cath and Cors/Grafts Angiography (N/A) as a surgical intervention .  The patient's history has been reviewed, patient examined, no change in status, stable for surgery.  I have reviewed the patient's chart and labs.  Questions were answered to the patient's satisfaction.     [name]

## 2015-04-28 NOTE — Progress Notes (Signed)
Site area: Right groin a 5 french arterial sheath was removed  Site Prior to Removal:  Level 0  Pressure Applied For 15 MINUTES    Minutes Beginning at 1445p  Manual:   Yes.    Patient Status During Pull:  stable  Post Pull Groin Site:  Level 0  Post Pull Instructions Given:  Yes.    Post Pull Pulses Present:  Yes.    Dressing Applied:  Yes.    Comments:  VS remain stable during sheath pull.

## 2015-04-28 NOTE — Progress Notes (Signed)
Patient: [name] / Admit Date: 04/26/2015 / Date of Encounter: 04/28/2015, 7:52 AM   Subjective: Feeling well. No CP or SOB. INR acceptable for cath.   Objective Telemetry: atrial fib, v pacing Physical Exam: Blood pressure 131/70, pulse 64, temperature 97.6 F (36.4 C), temperature source Oral, resp. rate 20, height 5\' 11"  (1.803 m), weight 181 lb 6.4 oz (82.283 kg), SpO2 99 %. General: Well developed, well nourished WM in no acute distress. Head: Normocephalic, atraumatic, sclera non-icteric, no xanthomas, nares are without discharge. Neck: Negative for carotid bruits. JVP not elevated. Lungs: Clear bilaterally to auscultation without wheezes, rales, or rhonchi. Breathing is unlabored. Heart: Irregularly irregular S1 S2, soft STEM without rubs or gallops.  Abdomen: Soft, non-tender, non-distended with normoactive bowel sounds. No rebound/guarding. Extremities: No clubbing or cyanosis. No edema. Distal pedal pulses are 2+ and equal bilaterally. Neuro: Alert and oriented X 3. Moves all extremities spontaneously. Psych:  Responds to questions appropriately with a normal affect.   Intake/Output Summary (Last 24 hours) at 04/28/15 0752 Last data filed at 04/28/15 0500  Gross per 24 hour  Intake    480 ml  Output   2825 ml  Net  -2345 ml    Inpatient Medications:  . aspirin  81 mg Oral Daily  . carvedilol  6.25 mg Oral BID WC  . doxepin  25 mg Oral Daily  . doxepin  50 mg Oral QHS  . gabapentin  100 mg Oral QID  . isosorbide mononitrate  60 mg Oral Daily  . lisinopril  5 mg Oral Daily  . loratadine  10 mg Oral Daily  . multivitamin with minerals  1 tablet Oral Daily  . multivitamin-lutein  1 capsule Oral BID  . pantoprazole  40 mg Oral Daily  . polyethylene glycol  17 g Oral Daily  . rosuvastatin  10 mg Oral q1800   Infusions:  . heparin      Labs:  Recent Labs  04/26/15 0657 04/28/15 0537  NA 133* 136  K 4.6 4.5  CL 97* 99*  CO2 31 31  GLUCOSE 132*  102*  BUN 15 22*  CREATININE 1.11 1.08  CALCIUM 8.9 9.1   No results for input(s): AST, ALT, ALKPHOS, BILITOT, PROT, ALBUMIN in the last 72 hours.  Recent Labs  04/25/15 2305  WBC 6.1  HGB 14.5  HCT 43.5  MCV 91.6  PLT 153    Recent Labs  04/26/15 0657 04/26/15 0807 04/26/15 1224  TROPONINI <0.03 <0.03 <0.03   Invalid input(s): POCBNP No results for input(s): HGBA1C in the last 72 hours.   Radiology/Studies:  Dg Chest 2 View  04/25/2015  CLINICAL DATA:  Acute onset of generalized chest pain. Initial encounter. EXAM: CHEST  2 VIEW COMPARISON:  Chest radiograph performed 11/27/2014 FINDINGS: The lungs are well-aerated. Mild peribronchial thickening is noted. There is no evidence of focal opacification, pleural effusion or pneumothorax. The heart is borderline normal in size. The patient is status post median sternotomy, with evidence of prior CABG. A pacemaker is noted at the left chest wall, with leads ending at the right atrium and right ventricle. No acute osseous abnormalities are seen. Clips are noted within the right upper quadrant, reflecting prior cholecystectomy. IMPRESSION: 1. No acute cardiopulmonary process seen. 2. Mild peribronchial thickening noted. Electronically Signed   By: Garald Balding M.D.   On: 04/25/2015 23:59     Assessment and Plan  74M with CAD s/p CABGx4 in 1991 with redo 2007, ICM EF 30%,  HTN, HLD, atrial flutter s/p unsuccessful RFCA in 2012 c/b CHB s/p St. Jude PPM, chronic atrial fib on chronic Coumadin presented with chest pain somewhat concerning for angina. Scheduled for upcoming eval at [facility] for esophageal stricture. 2D Echo 04/27/15: mild LVH, EF 30-35%, grade 3 DD, mildly calcified leaflets, mod calcified MV annulus with mild MR, mod LA/RA.   1. Chest pain/possible [place] 2. CAD s/p CABG with redo as above 3. ICM/chronic systolic chf - EF remains near baseline 4. Chronic atrial fib, on Coumadin at home (prior intolerance to Xarelto) 5. HTN 6.  HLD (recent LDL 73 & patient then switched to Crestor)  Plan cath today - is on add-on board. INR 1.63. Risks and benefits of cardiac catheterization have been discussed with the patient.  These include bleeding, infection, kidney damage, stroke, heart attack, death. The patient understands these risks and is willing to proceed. Discussed pre-cath hydration with Dr. Mare Ferrari - plan [facility] given low EF. Patient does not need LV gram since he had EF by echo this admission (cath lab aware).  Signed, Melina Copa PA-C Pager: [phone]   Patient seen and examined and history reviewed. Agree with above findings and plan. Very pleasant 74 yo WM s/p Redo CABG in 2007. Last cath in October 2012 showed patent grafts. Now presents with increased chest pain. He has ruled out for MI. Coumadin on hold for cardiac cath today and INR down to 1.63. Echo is stable. Will proceed with cardiac cath today. If negative will proceed with assessment of esophagus planned at [facility]. He is also planning to have inguinal hernia surgery in February.  [name], [place] 04/28/2015 9:48 AM

## 2015-04-28 NOTE — Discharge Instructions (Signed)
Groin Surgical Site Care °Refer to this sheet in the next few weeks. These instructions provide you with information about caring for yourself after your procedure. Your health care provider may also give you more specific instructions. Your treatment has been planned according to current medical practices, but problems sometimes occur. Call your health care provider if you have any problems or questions after your procedure. °WHAT TO EXPECT AFTER THE PROCEDURE °After your procedure, it is typical to have the following: °· Bruising at the groin site that usually fades within 1-2 weeks. °· Blood collecting in the tissue (hematoma) that may be painful to the touch. It should usually decrease in size and tenderness within 1-2 weeks. °HOME CARE INSTRUCTIONS °· Take medicines only as directed by your health care provider. °· You may shower 24-48 hours after the procedure or as directed by your health care provider. Remove the bandage (dressing) and gently wash the site with plain soap and water. Pat the area dry with a clean towel. Do not rub the site, because this may cause bleeding. °· Do not take baths, swim, or use a hot tub until your health care provider approves. °· Check your insertion site every day for redness, swelling, or drainage. °· Do not apply powder or lotion to the site. °· Limit use of stairs to twice a day for the first 2-3 days or as directed by your health care provider. °· Do not squat for the first 2-3 days or as directed by your health care provider. °· Do not lift over 10 lb (4.5 kg) for 5 days after your procedure or as directed by your health care provider. °· Ask your health care provider when it is okay to: °¨ Return to work or school. °¨ Resume usual physical activities or sports. °¨ Resume sexual activity. °· Do not drive home if you are discharged the same day as the procedure. Have someone else drive you. °· You may drive 24 hours after the procedure unless otherwise instructed by your  health care provider. °· Do not operate machinery or power tools for 24 hours after the procedure or as directed by your health care provider. °· If your procedure was done as an outpatient procedure, which means that you went home the same day as your procedure, a responsible adult should be with you for the first 24 hours after you arrive home. °· Keep all follow-up visits as directed by your health care provider. This is important. °SEEK MEDICAL CARE IF: °· You have a fever. °· You have chills. °· You have increased bleeding from the groin site. Hold pressure on the site. °SEEK IMMEDIATE MEDICAL CARE IF: °· You have unusual pain at the groin site. °· You have redness, warmth, or swelling at the groin site. °· You have drainage (other than a small amount of blood on the dressing) from the groin site. °· The groin site is bleeding, and the bleeding does not stop after 30 minutes of holding steady pressure on the site. °· Your leg or foot becomes pale, cool, tingly, or numb. °  °This information is not intended to replace advice given to you by your health care provider. Make sure you discuss any questions you have with your health care provider. °  °Document Released: 12/20/2013 Document Reviewed: 12/20/2013 °Elsevier Interactive Patient Education ©2016 [employer] ° °

## 2015-04-28 NOTE — H&P (View-Only) (Signed)
Patient: [name] / Admit Date: 04/26/2015 / Date of Encounter: 04/28/2015, 7:52 AM   Subjective: Feeling well. No CP or SOB. INR acceptable for cath.   Objective Telemetry: atrial fib, v pacing Physical Exam: Blood pressure 131/70, pulse 64, temperature 97.6 F (36.4 C), temperature source Oral, resp. rate 20, height 5\' 11"  (1.803 m), weight 181 lb 6.4 oz (82.283 kg), SpO2 99 %. General: Well developed, well nourished WM in no acute distress. Head: Normocephalic, atraumatic, sclera non-icteric, no xanthomas, nares are without discharge. Neck: Negative for carotid bruits. JVP not elevated. Lungs: Clear bilaterally to auscultation without wheezes, rales, or rhonchi. Breathing is unlabored. Heart: Irregularly irregular S1 S2, soft STEM without rubs or gallops.  Abdomen: Soft, non-tender, non-distended with normoactive bowel sounds. No rebound/guarding. Extremities: No clubbing or cyanosis. No edema. Distal pedal pulses are 2+ and equal bilaterally. Neuro: Alert and oriented X 3. Moves all extremities spontaneously. Psych:  Responds to questions appropriately with a normal affect.   Intake/Output Summary (Last 24 hours) at 04/28/15 0752 Last data filed at 04/28/15 0500  Gross per 24 hour  Intake    480 ml  Output   2825 ml  Net  -2345 ml    Inpatient Medications:  . aspirin  81 mg Oral Daily  . carvedilol  6.25 mg Oral BID WC  . doxepin  25 mg Oral Daily  . doxepin  50 mg Oral QHS  . gabapentin  100 mg Oral QID  . isosorbide mononitrate  60 mg Oral Daily  . lisinopril  5 mg Oral Daily  . loratadine  10 mg Oral Daily  . multivitamin with minerals  1 tablet Oral Daily  . multivitamin-lutein  1 capsule Oral BID  . pantoprazole  40 mg Oral Daily  . polyethylene glycol  17 g Oral Daily  . rosuvastatin  10 mg Oral q1800   Infusions:  . heparin      Labs:  Recent Labs  04/26/15 0657 04/28/15 0537  NA 133* 136  K 4.6 4.5  CL 97* 99*  CO2 31 31  GLUCOSE 132*  102*  BUN 15 22*  CREATININE 1.11 1.08  CALCIUM 8.9 9.1   No results for input(s): AST, ALT, ALKPHOS, BILITOT, PROT, ALBUMIN in the last 72 hours.  Recent Labs  04/25/15 2305  WBC 6.1  HGB 14.5  HCT 43.5  MCV 91.6  PLT 153    Recent Labs  04/26/15 0657 04/26/15 0807 04/26/15 1224  TROPONINI <0.03 <0.03 <0.03   Invalid input(s): POCBNP No results for input(s): HGBA1C in the last 72 hours.   Radiology/Studies:  Dg Chest 2 View  04/25/2015  CLINICAL DATA:  Acute onset of generalized chest pain. Initial encounter. EXAM: CHEST  2 VIEW COMPARISON:  Chest radiograph performed 11/27/2014 FINDINGS: The lungs are well-aerated. Mild peribronchial thickening is noted. There is no evidence of focal opacification, pleural effusion or pneumothorax. The heart is borderline normal in size. The patient is status post median sternotomy, with evidence of prior CABG. A pacemaker is noted at the left chest wall, with leads ending at the right atrium and right ventricle. No acute osseous abnormalities are seen. Clips are noted within the right upper quadrant, reflecting prior cholecystectomy. IMPRESSION: 1. No acute cardiopulmonary process seen. 2. Mild peribronchial thickening noted. Electronically Signed   By: Garald Balding M.D.   On: 04/25/2015 23:59     Assessment and Plan  29M with CAD s/p CABGx4 in 1991 with redo 2007, ICM EF 30%,  HTN, HLD, atrial flutter s/p unsuccessful RFCA in 2012 c/b CHB s/p St. Jude PPM, chronic atrial fib on chronic Coumadin presented with chest pain somewhat concerning for angina. Scheduled for upcoming eval at [facility] for esophageal stricture. 2D Echo 04/27/15: mild LVH, EF 30-35%, grade 3 DD, mildly calcified leaflets, mod calcified MV annulus with mild MR, mod LA/RA.   1. Chest pain/possible [place] 2. CAD s/p CABG with redo as above 3. ICM/chronic systolic chf - EF remains near baseline 4. Chronic atrial fib, on Coumadin at home (prior intolerance to Xarelto) 5. HTN 6.  HLD (recent LDL 73 & patient then switched to Crestor)  Plan cath today - is on add-on board. INR 1.63. Risks and benefits of cardiac catheterization have been discussed with the patient.  These include bleeding, infection, kidney damage, stroke, heart attack, death. The patient understands these risks and is willing to proceed. Discussed pre-cath hydration with Dr. Mare Ferrari - plan [facility] given low EF. Patient does not need LV gram since he had EF by echo this admission (cath lab aware).  Signed, Melina Copa PA-C Pager: [phone]   Patient seen and examined and history reviewed. Agree with above findings and plan. Very pleasant 74 yo WM s/p Redo CABG in 2007. Last cath in October 2012 showed patent grafts. Now presents with increased chest pain. He has ruled out for MI. Coumadin on hold for cardiac cath today and INR down to 1.63. Echo is stable. Will proceed with cardiac cath today. If negative will proceed with assessment of esophagus planned at [facility]. He is also planning to have inguinal hernia surgery in February.  [name], [place] 04/28/2015 9:48 AM

## 2015-04-29 ENCOUNTER — Telehealth: Payer: Self-pay | Admitting: Pharmacist

## 2015-04-29 ENCOUNTER — Other Ambulatory Visit: Payer: Self-pay | Admitting: Cardiology

## 2015-04-29 ENCOUNTER — Encounter (HOSPITAL_COMMUNITY): Payer: Self-pay | Admitting: Interventional Cardiology

## 2015-04-29 NOTE — Telephone Encounter (Signed)
Patient missed 2 doses while he was in hospital.  Prior to hospitalization he has taking 5mg  1 tablet daily (although records show 1/2 tablet on saturdays and 1 tablet all other days) Recommended take 10mg  today and then restart usual dose of warfarin 5mg  1 tablet daily. Recheck INR at visit next week 05/06/14

## 2015-04-30 ENCOUNTER — Ambulatory Visit (INDEPENDENT_AMBULATORY_CARE_PROVIDER_SITE_OTHER): Payer: Medicare Other | Admitting: Family

## 2015-04-30 ENCOUNTER — Telehealth: Payer: Self-pay | Admitting: *Deleted

## 2015-04-30 VITALS — BP 127/70 | HR 63 | Temp 97.4°F | Ht 71.0 in | Wt 186.4 lb

## 2015-04-30 DIAGNOSIS — I4891 Unspecified atrial fibrillation: Secondary | ICD-10-CM | POA: Diagnosis not present

## 2015-04-30 DIAGNOSIS — J309 Allergic rhinitis, unspecified: Secondary | ICD-10-CM

## 2015-04-30 DIAGNOSIS — I482 Chronic atrial fibrillation, unspecified: Secondary | ICD-10-CM

## 2015-04-30 LAB — POCT INR: INR: 1.6

## 2015-04-30 MED ORDER — FLUTICASONE PROPIONATE 50 MCG/ACT NA SUSP: 2.0000 | Freq: Every day | NASAL | Status: AC

## 2015-04-30 NOTE — Patient Instructions (Addendum)
Anticoagulation Dose Instructions as of 04/30/2015      Dorene Grebe Tue Wed Thu Fri Sat   04/30/2015 thru 05/02/2015     10 mg 2 tablets 5 mg 1 tablet  2.5 mg 1/2 tablet   05/03/2015 thru 05/09/2015 5 mg 1 tablet 5 mg 1 tablet 5 mg 1 tablet 5 mg 1 tablet 5 mg 1 tablet 5 mg 1 tablet 2.5 mg 1/2 tablet    Description        Take 2 tablets today- Thursday, December 29th, then resume usual dose of 1 tablet Sunday through Friday and 1/2 tablet on saturdays only     Allergic Rhinitis Allergic rhinitis is when the mucous membranes in the nose respond to allergens. Allergens are particles in the air that cause your body to have an allergic reaction. This causes you to release allergic antibodies. Through a chain of events, these eventually cause you to release histamine into the blood stream. Although meant to protect the body, it is this release of histamine that causes your discomfort, such as frequent sneezing, congestion, and an itchy, runny nose.  CAUSES Seasonal allergic rhinitis (hay fever) is caused by pollen allergens that may come from grasses, trees, and weeds. Year-round allergic rhinitis (perennial allergic rhinitis) is caused by allergens such as house dust mites, pet dander, and mold spores. SYMPTOMS  Nasal stuffiness (congestion).  Itchy, runny nose with sneezing and tearing of the eyes. DIAGNOSIS Your health care provider can help you determine the allergen or allergens that trigger your symptoms. If you and your health care provider are unable to determine the allergen, skin or blood testing may be used. Your health care provider will diagnose your condition after taking your health history and performing a physical exam. Your health care provider may assess you for other related conditions, such as asthma, pink eye, or an ear infection. TREATMENT Allergic rhinitis does not have a cure, but it can be controlled by:  Medicines that block allergy symptoms. These may include  allergy shots, nasal sprays, and oral antihistamines.  Avoiding the allergen. Hay fever may often be treated with antihistamines in pill or nasal spray forms. Antihistamines block the effects of histamine. There are over-the-counter medicines that may help with nasal congestion and swelling around the eyes. Check with your health care provider before taking or giving this medicine. If avoiding the allergen or the medicine prescribed do not work, there are many new medicines your health care provider can prescribe. Stronger medicine may be used if initial measures are ineffective. Desensitizing injections can be used if medicine and avoidance does not work. Desensitization is when a patient is given ongoing shots until the body becomes less sensitive to the allergen. Make sure you follow up with your health care provider if problems continue. HOME CARE INSTRUCTIONS It is not possible to completely avoid allergens, but you can reduce your symptoms by taking steps to limit your exposure to them. It helps to know exactly what you are allergic to so that you can avoid your specific triggers. SEEK MEDICAL CARE IF:  You have a fever.  You develop a cough that does not stop easily (persistent).  You have shortness of breath.  You start wheezing.  Symptoms interfere with normal daily activities.   This information is not intended to replace advice given to you by your health care provider. Make sure you discuss any questions you have with your health care provider.   Document Released: 01/11/2001 Document Revised: 05/09/2014 Document Reviewed:  12/24/2012 Elsevier Interactive Patient Education 2016 [place] meds as prescribed - Use a cool mist humidifier  -Use saline nose sprays frequently -Saline irrigations of the nose can be very helpful if done frequently.  * 4X daily for 1 week*  * Use of a nettie pot can be helpful with this. Follow directions with this* -Force fluids -For  any cough or congestion  Use plain Mucinex- regular strength or max strength is fine   * Children- consult with Pharmacist for dosing -For fever or aces or pains- take tylenol or ibuprofen appropriate for age and weight.  * for fevers greater than 101 orally you may alternate ibuprofen and tylenol every  3 hours. -Throat lozenges if help  Evelina Dun, FNP

## 2015-04-30 NOTE — Telephone Encounter (Signed)
Call Completed and Appointment Scheduled: Yes, Date: 05/06/15 Dr Evette Doffing   DISCHARGE INFORMATION Date of Discharge:04/28/15  [facility]  Principal Discharge Diagnosis: chest pain  Patient and/or caregiver is knowledgeable of his/her condition(s) and treatment: Yes   MEDICATION RECONCILIATION Medication list reviewed with patient: Yes  Patient is able to obtain needed medications: Yes   ACTIVITIES OF DAILY LIVING  Is the patient able to perform his/her own ADLs: Yes  Patient is receiving home health services: no   PATIENT EDUCATION Questions/Concerns Discussed: No concerns at this time.

## 2015-04-30 NOTE — Progress Notes (Signed)
Subjective:    Patient ID: [name], male    DOB: [birth year], 74 y.o.   MRN: [id number]  PT presents to the office today with "stuffy head, headache, and just not feeling good". PT states he has a heart catheterization on Tuesday and is worried he is having complications. Pt denies any SOB, chest pain, or edema. PT states for the last hour he has just felt "weird" and feels like his head and nose is "stopped up". Pt states he can not breath out of his nose because he is so "congestanted".  Headache  This is a new problem. The current episode started today. The problem has been unchanged. The pain is located in the frontal region. The pain quality is similar to prior headaches. Associated symptoms include sinus pressure and a sore throat. Pertinent negatives include no blurred vision, coughing, visual change or vomiting. He has tried nothing for the symptoms. The treatment provided no relief.      Review of Systems  Constitutional: Negative.   HENT: Positive for sinus pressure and sore throat.   Eyes: Negative for blurred vision.  Respiratory: Negative.  Negative for cough.   Cardiovascular: Negative.   Gastrointestinal: Negative.  Negative for vomiting.  Endocrine: Negative.   Genitourinary: Negative.   Musculoskeletal: Negative.   Neurological: Positive for headaches.  Hematological: Negative.   Psychiatric/Behavioral: Negative.   All other systems reviewed and are negative.      Objective:   Physical Exam  Constitutional: He is oriented to person, place, and time. He appears well-developed and well-nourished. No distress.  HENT:  Head: Normocephalic.  Right Ear: External ear normal.  Left Ear: External ear normal.  Mouth/Throat: Oropharynx is clear and moist.  Nasal passage erythemas with mild swelling    Eyes: Pupils are equal, round, and reactive to light. Right eye exhibits no discharge. Left eye exhibits no discharge.  Neck: Normal range of motion. Neck supple.  No thyromegaly present.  Cardiovascular: Normal rate, regular rhythm and intact distal pulses.   Murmur heard. Pulmonary/Chest: Effort normal and breath sounds normal. No respiratory distress. He has no wheezes.  Abdominal: Soft. Bowel sounds are normal. He exhibits no distension. There is no tenderness.  Musculoskeletal: Normal range of motion. He exhibits no edema or tenderness.  Neurological: He is alert and oriented to person, place, and time. He has normal reflexes. He displays normal reflexes. No cranial nerve deficit. He exhibits normal muscle tone. Coordination normal.  Skin: Skin is warm and dry. No rash noted. No erythema.  Psychiatric: He has a normal mood and affect. His behavior is normal. Judgment and thought content normal.  Vitals reviewed.     BP 127/70 mmHg  Pulse 63  Temp(Src) 97.4 F (36.3 C) (Oral)  Ht 5\' 11"  (1.803 m)  Wt 186 lb 6.4 oz (84.55 kg)  BMI 26.01 kg/m2     Assessment & Plan:  1. Chronic atrial fibrillation (HCC) - POCT INR  2. Atrial fibrillation, unspecified type ([place])  3. Allergic rhinitis, unspecified allergic rhinitis type -Avoid allergen when possible  - Take meds as prescribed - Use a cool mist humidifier  -Use saline nose sprays frequently -Saline irrigations of the nose can be very helpful if done frequently.  * 4X daily for 1 week*  * Use of a nettie pot can be helpful with this. Follow directions with this* -Force fluids -For any cough or congestion  Use plain Mucinex- regular strength or max strength is fine   * Children- consult  with Pharmacist for dosing -For fever or aces or pains- take tylenol or ibuprofen appropriate for age and weight.  * for fevers greater than 101 orally you may alternate ibuprofen and tylenol every  3 hours. -Throat lozenges if help - fluticasone (FLONASE) 50 MCG/ACT nasal spray; Place 2 sprays into both nostrils daily.  Dispense: 16 g; Refill: [place], FNP

## 2015-05-01 ENCOUNTER — Encounter: Payer: Self-pay | Admitting: Pediatrics

## 2015-05-01 ENCOUNTER — Ambulatory Visit (INDEPENDENT_AMBULATORY_CARE_PROVIDER_SITE_OTHER): Payer: Medicare Other | Admitting: Pediatrics

## 2015-05-01 VITALS — BP 121/70 | HR 63 | Temp 97.0°F | Ht 71.0 in | Wt 186.6 lb

## 2015-05-01 DIAGNOSIS — J101 Influenza due to other identified influenza virus with other respiratory manifestations: Secondary | ICD-10-CM | POA: Diagnosis not present

## 2015-05-01 DIAGNOSIS — R6889 Other general symptoms and signs: Secondary | ICD-10-CM | POA: Diagnosis not present

## 2015-05-01 LAB — POCT INFLUENZA A/B
INFLUENZA A, POC: NEGATIVE
INFLUENZA B, POC: POSITIVE — AB

## 2015-05-01 MED ORDER — OSELTAMIVIR PHOSPHATE 75 MG PO CAPS: 75.0000 mg | ORAL_CAPSULE | Freq: Two times a day (BID) | ORAL | Status: DC

## 2015-05-01 NOTE — Progress Notes (Signed)
Subjective:    Patient ID: [name], male    DOB: [birth year], 74 y.o.   MRN: [id number]  CC: flu-like symptoms  HPI: [name] is a 74 y.o. male presenting for flu-like symptoms Started feeling sick yesterday, hot and cold, subjective fevers Some sore throat, some cough +muscle aches Appetite today has been ok so far Today still feeling tired, not hsi normal self Was discharged form the hospital three days ago for CP evaluation, had a heart catheter with no intervention done, no new blockages found. I have reviewed the medical records. Pt with significant cardiac history. His son does have the flu, was visiting him several days ago Says he has enough of all of his meds at home for now No further chest pain Going to follow up with GI for esophageal dilation and with his surgeon for likely inguinal hernia repair.   Depression screen [facility] 2/9 05/01/2015 03/20/2015 03/11/2015 02/12/2015 01/28/2015  Decreased Interest 0 0 0 0 0  Down, Depressed, Hopeless 0 0 0 0 0  PHQ - 2 Score 0 0 0 0 0     Relevant past medical, surgical, family and social history reviewed and updated as indicated. Interim medical history since our last visit reviewed. Allergies and medications reviewed and updated.    ROS: Per HPI unless specifically indicated above  History  Smoking status  . Former Smoker -- 0.00 packs/day for 0 years  . Types: Cigarettes  . Quit date: 09/21/1973  Smokeless tobacco  . Never Used    Past Medical History Patient Active Problem List   Diagnosis Date Noted  . Chronic systolic CHF (congestive heart failure) ([place]) 04/28/2015  . Chest pain 04/26/2015  . Bilateral calf pain 03/11/2015  . Neck pain 03/11/2015  . Right groin pain 01/19/2015  . Unstable angina ([place]) 11/28/2014  . Claudication of gluteal region ([place]) 01/10/2014  . Chest pain with moderate risk for cardiac etiology 04/12/2013  . Midsternal chest pain 03/27/2013  . Ventral hernia 01/23/2013  .  Angina pectoris ([place]) 08/30/2012  . Chronic systolic CHF (congestive heart failure), NYHA class 2 ([place])   . Chronic anticoagulation   . Atypical chest pain 03/11/2012  . Umbilical hernia [id number]  . CAD (coronary artery disease) 10/20/2011  . Atrial fibrillation-Permanent 05/31/2011  . 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 Pacemaker-stJudes 05/31/2011  . Chest pain at rest 04/11/2011  . Cough, persistent 11/15/2010  . Hx of CABG 09/23/2010  . Ischemic cardiomyopathy   . Hypertension   . Hyperlipidemia   . GERD (gastroesophageal reflux disease)   . Anxiety   . Fibromyalgia       Objective:    BP 121/70 mmHg  Pulse 63  Temp(Src) 97 F (36.1 C) (Oral)  Ht 5\' 11"  (1.803 m)  Wt 186 lb 9.6 oz (84.641 kg)  BMI 26.04 kg/m2  Wt Readings from Last 3 Encounters:  05/01/15 186 lb 9.6 oz (84.641 kg)  04/30/15 186 lb 6.4 oz (84.55 kg)  04/28/15 181 lb 6.4 oz (82.283 kg)     Gen: NAD, alert, cooperative with exam, NCAT EYES: EOMI, no scleral injection or icterus ENT:  TMs dull gray b/l, OP without erythema LYMPH: no cervical LAD CV: NRRR, normal S1/S2, no murmur, distal pulses 2+ b/l Resp: CTABL, no wheezes, normal WOB Abd: +BS, soft, NTND. no guarding or organomegaly Ext: No edema, warm Neuro: Alert and oriented     Assessment & Plan:   [name] is a 74yo with multiple med problems,  recent discharge for CP, had cardiac cath that was negative, continues to have inguinal hernia that is soft today, likely to get repaired soon. I have reviewed his hospital records. Here today with muscle ache, fatigue, fever, found to have Flu B. Discussed risks/benefits of treatment, will go ahead and do tamiflu. Return precautions given. Symptomatic care discussed.  Diagnoses and all orders for this visit:  Flu-like symptoms -     POCT Influenza A/B  Influenza B -     oseltamivir (TAMIFLU) 75 MG capsule; Take 1 capsule (75 mg total) by mouth 2 (two) times daily.    Follow  up plan: As needed  Assunta Found, MD [facility] 05/01/2015, 12:50 PM

## 2015-05-05 ENCOUNTER — Telehealth: Payer: Self-pay | Admitting: Pediatrics

## 2015-05-05 ENCOUNTER — Telehealth: Payer: Self-pay | Admitting: Cardiology

## 2015-05-05 MED ORDER — NITROSTAT 0.4 MG SL SUBL: SUBLINGUAL_TABLET | SUBLINGUAL | Status: DC

## 2015-05-05 NOTE — Telephone Encounter (Signed)
New message       *STAT* If patient is at the pharmacy, call can be transferred to refill team.   1. Which medications need to be refilled? (please list name of each medication and dose if known)  Nitrostat 0.4mg  2. Which pharmacy/location (including street and city if local pharmacy) is medication to be sent to? [email or web address]   3. Do they need a 30 day or 90 day supply? 4 bottles----25 pills per bottle-----pt has a presc for 1 bottle of 25 pills.  (The cost is the same for 4 bottles of 25 pills each)

## 2015-05-05 NOTE — Telephone Encounter (Signed)
Patient taking benadryl and doing fine.  He will try taking another dose but if it makes him feel bad, will discontinue.

## 2015-05-06 ENCOUNTER — Ambulatory Visit (INDEPENDENT_AMBULATORY_CARE_PROVIDER_SITE_OTHER): Payer: Medicare Other | Admitting: Internal Medicine

## 2015-05-06 ENCOUNTER — Encounter: Payer: Self-pay | Admitting: Internal Medicine

## 2015-05-06 ENCOUNTER — Ambulatory Visit (INDEPENDENT_AMBULATORY_CARE_PROVIDER_SITE_OTHER): Payer: Medicare Other | Admitting: Pediatrics

## 2015-05-06 VITALS — BP 124/70 | HR 68 | Ht 71.0 in | Wt 184.6 lb

## 2015-05-06 DIAGNOSIS — I481 Persistent atrial fibrillation: Secondary | ICD-10-CM | POA: Diagnosis not present

## 2015-05-06 DIAGNOSIS — Z950 Presence of cardiac pacemaker: Secondary | ICD-10-CM

## 2015-05-06 DIAGNOSIS — I4819 Other persistent atrial fibrillation: Secondary | ICD-10-CM

## 2015-05-06 DIAGNOSIS — I482 Chronic atrial fibrillation, unspecified: Secondary | ICD-10-CM

## 2015-05-06 DIAGNOSIS — I442 Atrioventricular block, complete: Secondary | ICD-10-CM

## 2015-05-06 LAB — POCT INR: INR: 2.1

## 2015-05-06 NOTE — Progress Notes (Signed)
Patient Care Team: Eustaquio Maize, MD as PCP - General (Pediatrics)   HPI  [name] is a 75 y.o. male Seen in followup for a pacemaker implanted following intermittent complete heart block associated with ablation of atrial flutter. He received a single RF lesion. No recovery of conduction and he underwent pacing the following day.     The patient denies chest pain, he has occasional peripheral edema. He does not have nocturnal dyspnea. He notes some impairment in exercise tolerance. He takes his time walking. He is short of breath climbing stairs..    He has  a history of ischemic heart disease with prior bypass surgery and redo bypass 2007. He underwent Myoview scanning 11/13 which demonstrated ejection fraction 32% inferolateral scar but no ischemia. He was admitted to hospital 12/16; these records were reviewed. He underwent catheterization demonstrated no obstructive disease. Ejection fraction remains low.  Coumadin was held at the time of this procedure.and is resumed  at his last visit we had discussed NOACs. On 2 occasions he tried Rivaroxaban but was unable to tolerate.   Past Medical History  Diagnosis Date  . Ischemic cardiomyopathy     a. 06/2012 EF 25% by echo.  . Hypertension   . Hyperlipidemia   . GERD (gastroesophageal reflux disease)   . CAD (coronary artery disease)     a. 1991 s/p CABGx4;  b. redo CABG in 2007 (SVG->OM, VG->PDA->PLV);  c. 01/2011 Cath: LM 80ost, LAD 100p, LCX 100ost, OM1 100, RCA 100, LIMA->LAD ok, VG->PDA ok, VG->OM ok;  d. 03/2012 MV: EF 32% inf & inflat scar w/o ischemia; e. Lexi MV: EF 33%, inf infarct, no ischemia. f. lexiscan 06/12/2014 RCA scar, no ischemia  . Atrial flutter ([place])     a. 01/2011 s/p unsuccessful RFCA complicated by CHB req PPM.  . CHB (complete heart block) ([place])     a. 01/2011: in setting of RFCA, s/p SJM 2110 Accent DC PPM, ser # [id number].  [name] Kitchen Kidney cysts     a. bilateral  . Hiatal hernia     a. 03/01/2013 EGD  and esoph dil.  [name] Kitchen BPH (benign prostatic hyperplasia)     a. elevated PSA  . A-fib (HCC)     a. chronic coumadin  . Elevated PSA, less than 10 ng/ml   . Fibromyalgia   . Skin cancer     'above left eyebrow; tip of my nose" (08/29/2012)  . Depression     "since 1986-1987" (08/29/2012)  . Anxiety   . Panic attacks   . Chronic systolic CHF (congestive heart failure), NYHA class 2 ([place])     a. 06/2012 Echo: EF 25%, distal sept and apical AK, sev dil LV, mild LVH, mod dil LA.  [name] Kitchen Pacemaker     Past Surgical History  Procedure Laterality Date  . Cardiac catheterization  10/30/2009    Grafts patent. Mild to moderate LV dysfunction. EF 45%. Managed medically.   . Cardiac catheterization      multiple  . Tonsillectomy  1960  . Cholecystectomy  2007  . Insert / replace / remove pacemaker  01/2011    initial placement  . Inguinal hernia repair  1960's    "left I think"  . Cataract extraction w/ intraocular lens  implant, bilateral  2012  . Cardioversion  05/25/2012    Procedure: CARDIOVERSION;  Surgeon: Darlin Coco, MD;  Location: [facility] ENDOSCOPY;  Service: Cardiovascular;  Laterality: N/A;  . Humerus surgery Left 1960    "  growth on the bone taken off; not cancer" (08/29/2012)  . Excisional hemorrhoidectomy  ~ 1980  . Skin cancer excision      'above left eyebrow; tip of my nose" (08/29/2012)  . Atrial flutter ablation  01/2011    "didn't work" (08/29/2012)  . Coronary artery bypass graft  08/27/1989    CABG X 5  . Coronary artery bypass graft  06/2005    CABG X 3  . Cardiac catheterization N/A 04/28/2015    Procedure: Left Heart Cath and Cors/Grafts Angiography;  Surgeon: Belva Crome, MD;  Location: [place] CV LAB;  Service: Cardiovascular;  Laterality: N/A;    Current Outpatient Prescriptions  Medication Sig Dispense Refill  . acetaminophen (TYLENOL) 500 MG tablet Take 500 mg by mouth every 8 (eight) hours as needed for mild pain.     [name] Kitchen ALPRAZolam (XANAX) 1 MG tablet Take 1 tablet  (1 mg total) by mouth 3 (three) times daily as needed. For anxiety 270 tablet 1  . carvedilol (COREG) 6.25 MG tablet Take 1 tablet by mouth 2 (two) times daily.  2  . doxepin (SINEQUAN) 25 MG capsule Take 25-50 mg by mouth 2 (two) times daily. Take 25 mg (1 capsule) by mouth in the morning and 50 mg (2 capsules) by mouth at night.    . fluticasone (FLONASE) 50 MCG/ACT nasal spray Place 2 sprays into both nostrils daily. 16 g 6  . furosemide (LASIX) 20 MG tablet One tablet by mouth daily as needed for swelling 30 tablet 5  . gabapentin (NEURONTIN) 100 MG capsule Take 100 mg by mouth 4 (four) times daily.    . isosorbide mononitrate (IMDUR) 60 MG 24 hr tablet Take 60 mg by mouth daily.    [name] Kitchen JANTOVEN 5 MG tablet TAKE 1 TABLET (5 MG TOTAL) BY MOUTH DAILY. 90 tablet 0  . lisinopril (PRINIVIL,ZESTRIL) 5 MG tablet 1 tablet by mouth every other day or as directed 90 tablet 3  . loratadine (CLARITIN) 10 MG tablet Take 10 mg by mouth daily.    . Multiple Vitamin (MULTIVITAMIN) tablet Take 1 tablet by mouth daily.      . Multiple Vitamins-Minerals (PRESERVISION AREDS 2) CAPS Take 1 capsule by mouth 2 (two) times daily.    [name] Kitchen NITROSTAT 0.4 MG SL tablet DISSOLVE ONE TABLET UNDER THE TONGUE EVERY 5 MINUTES AS NEEDED FOR CHEST PAIN.  DO NOT EXCEED A TOTAL OF 3 DOSES IN 15 MINUTES 25 tablet 1  . omeprazole (PRILOSEC) 20 MG capsule Take 1 capsule (20 mg total) by mouth 2 (two) times daily. 180 capsule 3  . polyethylene glycol (MIRALAX / GLYCOLAX) packet Take 17 g by mouth daily.     . rosuvastatin (CRESTOR) 10 MG tablet Take 1 tablet (10 mg total) by mouth daily. 90 tablet 3   No current facility-administered medications for this visit.    Allergies  Allergen Reactions  . Sulfa Drugs Cross Reactors Other (See Comments)    unknown  . Ultram [Tramadol Hcl] Itching    "don't remember how bad"  . Xarelto [Rivaroxaban] Other (See Comments)    Itching     Review of Systems negative except from HPI and  PMH  Physical Exam BP 124/70 mmHg  Pulse 68  Ht 5\' 11"  (1.803 m)  Wt 184 lb 9.6 oz (83.734 kg)  BMI 25.76 kg/m2 Well developed and well nourished in no acute distress HENT normal E scleral and icterus clear Neck Supple Carotids brisk Clear to ausculation Device pocket  well healed; without hematoma or erythema.  There is no tethering   Regular rate and rhythm  2/6 m S2 split Soft with active bowel sounds No clubbing cyanosis none Edema Alert and oriented, grossly normal motor and sensory function Skin Warm and Dry  ECG dated this to see demonstrates atrial fibrillation with ventricular pacing with infrequent PVCs  Assessment and  Plan  Atrial fibrillation  permanent   PVCs  Ischemic heart disease with cardiomyopathy and prior bypass surgery  Complete heart block  Pacemaker St Jude The patient's device was interrogated.  The information was reviewed. No changes were made in the programming.       The patient has persistent left ventricular dysfunction he has class 2-3 congestive heart failure. I wonder whether it is reasonable in this relatively young man to consider CRT D upgrade. I will ask Dr. TB to weigh in on this. For now, we will keep him on guideline directed medical therapy.  He is averse to trying an alternative NOAC.  His PVC burden based on his device is less than 1% not withstanding the to PVCs noted on his ECG . They're to infrequent pursue therapy.

## 2015-05-06 NOTE — Progress Notes (Signed)
Pt seen by pharmacist for INR check, seen by me last week for hosp f/u, also diagnosed with flu. If feeling better told him he did not need to keep appt today for hosp f/u with me. Pt decided not to stay for appt after INR check.

## 2015-05-06 NOTE — Patient Instructions (Signed)
Medication Instructions: - no changes  Labwork: - none  Procedures/Testing: - none  Follow-Up: - Remote monitoring is used to monitor your Pacemaker of ICD from home. This monitoring reduces the number of office visits required to check your device to one time per year. It allows [place] to keep an eye on the functioning of your device to ensure it is working properly. You are scheduled for a device check from home on 08/05/15. You may send your transmission at any time that day. If you have a wireless device, the transmission will be sent automatically. After your physician reviews your transmission, you will receive a postcard with your next transmission date.  - Your physician wants you to follow-up in: 1 year with Dr. Caryl Comes. You will receive a reminder letter in the mail two months in advance. If you don't receive a letter, please call our office to schedule the follow-up appointment.  Any Additional Special Instructions Will Be Listed Below (If Applicable).

## 2015-05-06 NOTE — Patient Instructions (Signed)
Anticoagulation Dose Instructions as of 05/06/2015      Dorene Grebe Tue Wed Thu Fri Sat   New Dose 5 mg 5 mg 5 mg 5 mg 5 mg 5 mg 2.5 mg    Description        Continue same warfarin 5mg  dose of 1 tablet Sundays through Fridays and 1/2 tablet on saturdays only     INR was 2.1 today

## 2015-05-08 ENCOUNTER — Telehealth: Payer: Self-pay | Admitting: Internal Medicine

## 2015-05-08 DIAGNOSIS — I4821 Permanent atrial fibrillation: Secondary | ICD-10-CM

## 2015-05-08 DIAGNOSIS — Z01812 Encounter for preprocedural laboratory examination: Secondary | ICD-10-CM

## 2015-05-08 DIAGNOSIS — I255 Ischemic cardiomyopathy: Secondary | ICD-10-CM

## 2015-05-08 NOTE — Telephone Encounter (Signed)
I spoke with the patient. He states Dr. Caryl Comes discussed with him about upgrading his device to a CRT-D at his last office visit on 04/04/16, but he was going to speak with Dr. Mare Ferrari. He feels like he might need the upgrade as he has had some SOB walking. I advised I would review with Dr Caryl Comes at the beginning of next week to see if we can proceed with his upgrade. He is agreeable and I will call him next week to follow up.

## 2015-05-08 NOTE — Telephone Encounter (Signed)
NEw Message  Pt requested to speak w/ RN concerning his appt from 05/06/15. Please call back and discuss.

## 2015-05-08 NOTE — Telephone Encounter (Signed)
Attempted to call the patient- no answer & no voice mail. Will call back.

## 2015-05-08 NOTE — Telephone Encounter (Signed)
Follow up      Talk to the nurse about upgrading his pacemaker.  Please call today

## 2015-05-11 ENCOUNTER — Telehealth: Payer: Self-pay | Admitting: Cardiology

## 2015-05-11 ENCOUNTER — Other Ambulatory Visit: Payer: Self-pay | Admitting: *Deleted

## 2015-05-11 MED ORDER — NITROGLYCERIN 0.4 MG SL SUBL: SUBLINGUAL_TABLET | SUBLINGUAL | Status: DC

## 2015-05-11 NOTE — Telephone Encounter (Signed)
Yes okay for him to use electric blanket.

## 2015-05-11 NOTE — Telephone Encounter (Signed)
Advised patient

## 2015-05-11 NOTE — Telephone Encounter (Signed)
Patient did get an electric blanket but instructions said not to use if you have poor circulation  Is this ok for him to use? Will forward to  Dr. Mare Ferrari for review

## 2015-05-11 NOTE — Telephone Encounter (Signed)
New message   Patient did not disclose any information only wants to speak with nurse.

## 2015-05-12 ENCOUNTER — Encounter: Payer: Self-pay | Admitting: Pharmacist Clinician (PhC)/ Clinical Pharmacy Specialist

## 2015-05-12 ENCOUNTER — Telehealth: Payer: Self-pay | Admitting: Pediatrics

## 2015-05-12 NOTE — Telephone Encounter (Signed)
Appointment given for Thursday with Evette Doffing.

## 2015-05-14 ENCOUNTER — Encounter: Payer: Self-pay | Admitting: Pediatrics

## 2015-05-14 ENCOUNTER — Ambulatory Visit (INDEPENDENT_AMBULATORY_CARE_PROVIDER_SITE_OTHER): Payer: Medicare Other | Admitting: Pediatrics

## 2015-05-14 VITALS — BP 122/72 | HR 96 | Temp 96.7°F | Ht 71.0 in | Wt 183.4 lb

## 2015-05-14 DIAGNOSIS — B372 Candidiasis of skin and nail: Secondary | ICD-10-CM | POA: Diagnosis not present

## 2015-05-14 MED ORDER — NYSTATIN 100000 UNIT/GM EX OINT: 1.0000 "application " | TOPICAL_OINTMENT | Freq: Two times a day (BID) | CUTANEOUS | Status: DC

## 2015-05-14 NOTE — Progress Notes (Signed)
Subjective:    Patient ID: [name], male    DOB: 1940-09-24, 75 y.o.   MRN: [id number]  CC: Rash   HPI: [name] is a 75 y.o. male presenting for Rash  At least 6 days ago strated getting red rash in groin Started two days after starting tamiflu and cipro Minimal itching at the sites Woke up last night itching like crazy everywhere, not so much the rash areas Took two benadryl helped some Tried triamcinolone/nystatin  Depression screen [facility] 2/9 05/14/2015 05/01/2015 03/20/2015 03/11/2015 02/12/2015  Decreased Interest 0 0 0 0 0  Down, Depressed, Hopeless 0 0 0 0 0  PHQ - 2 Score 0 0 0 0 0     Relevant past medical, surgical, family and social history reviewed and updated as indicated. Interim medical history since our last visit reviewed. Allergies and medications reviewed and updated.    ROS: Per HPI unless specifically indicated above  History  Smoking status  . Former Smoker -- 0.00 packs/day for 0 years  . Types: Cigarettes  . Quit date: 09/21/1973  Smokeless tobacco  . Never Used    Past Medical History Patient Active Problem List   Diagnosis Date Noted  . Chronic systolic CHF (congestive heart failure) ([place]) 04/28/2015  . Chest pain 04/26/2015  . Bilateral calf pain 03/11/2015  . Neck pain 03/11/2015  . Right groin pain 01/19/2015  . Unstable angina ([place]) 11/28/2014  . Claudication of gluteal region ([place]) 01/10/2014  . Chest pain with moderate risk for cardiac etiology 04/12/2013  . Midsternal chest pain 03/27/2013  . Ventral hernia 01/23/2013  . Angina pectoris ([place]) 08/30/2012  . Chronic systolic CHF (congestive heart failure), NYHA class 2 ([place])   . Chronic anticoagulation   . Atypical chest pain 03/11/2012  . Umbilical hernia [id number]  . CAD (coronary artery disease) 10/20/2011  . Atrial fibrillation-Permanent 05/31/2011  . 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 Pacemaker-stJudes 05/31/2011  . Chest pain at rest  04/11/2011  . Cough, persistent 11/15/2010  . Hx of CABG 09/23/2010  . Ischemic cardiomyopathy   . Hypertension   . Hyperlipidemia   . GERD (gastroesophageal reflux disease)   . Anxiety   . Fibromyalgia         Objective:    BP 122/72 mmHg  Pulse 96  Temp(Src) 96.7 F (35.9 C) (Oral)  Ht 5\' 11"  (1.803 m)  Wt 183 lb 6.4 oz (83.19 kg)  BMI 25.59 kg/m2  Wt Readings from Last 3 Encounters:  05/14/15 183 lb 6.4 oz (83.19 kg)  05/06/15 184 lb 9.6 oz (83.734 kg)  05/01/15 186 lb 9.6 oz (84.641 kg)    Gen: NAD, alert, cooperative with exam, NCAT EYES: EOMI, no scleral injection or icterus ENT: MMM CV: WWP Resp: normal WOB Abd: +BS, soft, NTND.  Ext: No edema, warm Neuro: Alert and oriented Skin: b/l upper inner thighs with several 7-76mm red raised flat top areas, some scale, some crusting present. A few smaller 3-23mm scabbed areas on R scrotum, some scale.  Skin scraping showed yeast under microscopy     Assessment & Plan:    [name] was seen today for rash in groin with skin scraping positive for yeast. Will treat with nystatin. Let me know if not improving. Still feels tired after diagnosis of flu 2 weeks ago. Encouraged symptomatic care, rest. RTC if rash not improving in 2 weeks.  Diagnoses and all orders for this visit:  Skin yeast infection -     nystatin  ointment (MYCOSTATIN); Apply 1 application topically 2 (two) times daily.   Follow up plan: PRN  Assunta Found, MD [facility] 05/14/2015, 11:30 AM

## 2015-05-18 ENCOUNTER — Telehealth: Payer: Self-pay | Admitting: Pediatrics

## 2015-05-18 NOTE — Telephone Encounter (Signed)
Pt given appt with Dr. Evette Doffing 1/25 at 11:15.

## 2015-05-21 ENCOUNTER — Encounter: Payer: Self-pay | Admitting: *Deleted

## 2015-05-21 NOTE — Telephone Encounter (Signed)
I spoke with the patient today and made him aware that Dr. Mare Ferrari and Dr. Caryl Comes are both in agreement with upgrading his device to a Bi-V ICD. The patient is agreeable with this.  He will come 06/09/15 for labs and have his procedure on 06/15/15.

## 2015-05-25 ENCOUNTER — Telehealth: Payer: Self-pay | Admitting: Internal Medicine

## 2015-05-25 NOTE — Telephone Encounter (Signed)
Calling stating he is scheduled to have upgrade his pacemaker to [place] on 2/13. States he also needs to have a (R) groin hernia repair.  He is scheduled to see Dr. Barkley Bruns on 2/7 to discuss.  He was inquiring whether he should have pacemaker upgrade prior to having hernia repair. Advised Dr. Caryl Comes and his nurse Nira Conn are out of office today but will forward message to them and they will call him with recommendations.  He verbalizes understanding.

## 2015-05-25 NOTE — Telephone Encounter (Signed)
New message     Patient has upcoming procedure with Dr. Caryl Comes on  Feb 13 .  Patient wants to discuss which surgery should come first - hernia / new pacemaker.

## 2015-05-26 ENCOUNTER — Ambulatory Visit (INDEPENDENT_AMBULATORY_CARE_PROVIDER_SITE_OTHER): Payer: Medicare Other | Admitting: Pharmacist Clinician (PhC)/ Clinical Pharmacy Specialist

## 2015-05-26 ENCOUNTER — Encounter: Payer: Self-pay | Admitting: Pharmacist Clinician (PhC)/ Clinical Pharmacy Specialist

## 2015-05-26 DIAGNOSIS — I482 Chronic atrial fibrillation, unspecified: Secondary | ICD-10-CM

## 2015-05-26 LAB — CUP PACEART INCLINIC DEVICE CHECK
Brady Statistic RV Percent Paced: 99.11 %
Implantable Lead Implant Date: 20120927
Implantable Lead Location: 753860
Lead Channel Impedance Value: 487.5 Ohm
Lead Channel Pacing Threshold Pulse Width: 0.4 ms
Lead Channel Sensing Intrinsic Amplitude: 12 mV
Lead Channel Setting Pacing Pulse Width: 0.4 ms
MDC IDC LEAD IMPLANT DT: 20120927
MDC IDC LEAD LOCATION: 753859
MDC IDC MSMT BATTERY REMAINING LONGEVITY: 117.6
MDC IDC MSMT BATTERY VOLTAGE: 2.95 V
MDC IDC MSMT LEADCHNL RV PACING THRESHOLD AMPLITUDE: 0.75 V
MDC IDC SESS DTM: 20170104162120
MDC IDC SET LEADCHNL RV PACING AMPLITUDE: 2.5 V
MDC IDC SET LEADCHNL RV SENSING SENSITIVITY: 2 mV
MDC IDC STAT BRADY RA PERCENT PACED: 0 %
Pulse Gen Serial Number: 7282780

## 2015-05-26 LAB — POCT INR: INR: 2.2

## 2015-05-26 NOTE — Telephone Encounter (Signed)
Discussed with Dr. Caryl Comes- both procedures are sterile, so it doesn't matter which is done first. I called the patient and made him aware of this. I advised him to see Dr. Zella Richer on 06/09/15 and then he can let me know what he wants to do. He is agreeable. I also advised him that per the [facility]- he may use an electric blanket as long as it is well grounded and in good working order. He verbalizes understanding of the above.

## 2015-05-27 ENCOUNTER — Ambulatory Visit: Payer: Medicare Other | Admitting: Pediatrics

## 2015-05-29 ENCOUNTER — Encounter (INDEPENDENT_AMBULATORY_CARE_PROVIDER_SITE_OTHER): Payer: Medicare Other | Admitting: Ophthalmology

## 2015-05-29 DIAGNOSIS — H353131 Nonexudative age-related macular degeneration, bilateral, early dry stage: Secondary | ICD-10-CM

## 2015-05-29 DIAGNOSIS — H33303 Unspecified retinal break, bilateral: Secondary | ICD-10-CM

## 2015-05-29 DIAGNOSIS — H43813 Vitreous degeneration, bilateral: Secondary | ICD-10-CM

## 2015-05-29 DIAGNOSIS — H35033 Hypertensive retinopathy, bilateral: Secondary | ICD-10-CM | POA: Diagnosis not present

## 2015-05-29 DIAGNOSIS — I10 Essential (primary) hypertension: Secondary | ICD-10-CM

## 2015-06-03 ENCOUNTER — Other Ambulatory Visit: Payer: Self-pay | Admitting: Internal Medicine

## 2015-06-03 DIAGNOSIS — I5022 Chronic systolic (congestive) heart failure: Secondary | ICD-10-CM

## 2015-06-05 ENCOUNTER — Encounter: Payer: Self-pay | Admitting: Pediatrics

## 2015-06-05 ENCOUNTER — Ambulatory Visit (INDEPENDENT_AMBULATORY_CARE_PROVIDER_SITE_OTHER): Payer: Medicare Other | Admitting: Pediatrics

## 2015-06-05 VITALS — BP 138/78 | HR 64 | Temp 97.2°F | Ht 71.0 in | Wt 186.2 lb

## 2015-06-05 DIAGNOSIS — J069 Acute upper respiratory infection, unspecified: Secondary | ICD-10-CM | POA: Diagnosis not present

## 2015-06-05 DIAGNOSIS — R6889 Other general symptoms and signs: Secondary | ICD-10-CM

## 2015-06-05 DIAGNOSIS — B354 Tinea corporis: Secondary | ICD-10-CM | POA: Diagnosis not present

## 2015-06-05 LAB — POCT INFLUENZA A/B
Influenza A, POC: NEGATIVE
Influenza B, POC: NEGATIVE

## 2015-06-05 NOTE — Progress Notes (Signed)
Subjective:    Patient ID: [name], male    DOB: 28-Oct-1940, 75 y.o.   MRN: [id number]  CC: Sore Throat; Generalized Body Aches; and Nasal Congestion   HPI: [name] is a 75 y.o. male presenting for Sore Throat; Generalized Body Aches; and Nasal Congestion  Started yesterday having a runny nose Feels more congested today Appetite is normal Doing normal activities No fevers Dry cough Scratchy throat   Depression screen [facility] 2/9 06/05/2015 05/14/2015 05/01/2015 03/20/2015 03/11/2015  Decreased Interest 0 0 0 0 0  Down, Depressed, Hopeless 0 0 0 0 0  PHQ - 2 Score 0 0 0 0 0     Relevant past medical, surgical, family and social history reviewed and updated as indicated. Interim medical history since our last visit reviewed. Allergies and medications reviewed and updated.    ROS: Per HPI unless specifically indicated above  History  Smoking status  . Former Smoker -- 0.00 packs/day for 0 years  . Types: Cigarettes  . Quit date: 09/21/1973  Smokeless tobacco  . Never Used    Past Medical History Patient Active Problem List   Diagnosis Date Noted  . Chronic systolic CHF (congestive heart failure) ([place]) 04/28/2015  . Chest pain 04/26/2015  . Bilateral calf pain 03/11/2015  . Neck pain 03/11/2015  . Right groin pain 01/19/2015  . Unstable angina ([place]) 11/28/2014  . Claudication of gluteal region ([place]) 01/10/2014  . Chest pain with moderate risk for cardiac etiology 04/12/2013  . Midsternal chest pain 03/27/2013  . Ventral hernia 01/23/2013  . Angina pectoris ([place]) 08/30/2012  . Chronic systolic CHF (congestive heart failure), NYHA class 2 ([place])   . Chronic anticoagulation   . Atypical chest pain 03/11/2012  . Umbilical hernia [id number]  . CAD (coronary artery disease) 10/20/2011  . Atrial fibrillation-Permanent 05/31/2011  . 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 Pacemaker-stJudes 05/31/2011  . Chest pain at rest 04/11/2011  . Cough,  persistent 11/15/2010  . Hx of CABG 09/23/2010  . Ischemic cardiomyopathy   . Hypertension   . Hyperlipidemia   . GERD (gastroesophageal reflux disease)   . Anxiety   . Fibromyalgia     Current Outpatient Prescriptions  Medication Sig Dispense Refill  . acetaminophen (TYLENOL) 500 MG tablet Take 500 mg by mouth every 8 (eight) hours as needed for mild pain.     [name] Kitchen ALPRAZolam (XANAX) 1 MG tablet Take 1 tablet (1 mg total) by mouth 3 (three) times daily as needed. For anxiety 270 tablet 1  . carvedilol (COREG) 6.25 MG tablet Take 1 tablet by mouth 2 (two) times daily.  2  . ciprofloxacin (CILOXAN) 0.3 % ophthalmic solution   0  . doxepin (SINEQUAN) 25 MG capsule Take 25-50 mg by mouth 2 (two) times daily. Take 25 mg (1 capsule) by mouth in the morning and 50 mg (2 capsules) by mouth at night.    . fluticasone (FLONASE) 50 MCG/ACT nasal spray Place 2 sprays into both nostrils daily. 16 g 6  . furosemide (LASIX) 20 MG tablet One tablet by mouth daily as needed for swelling 30 tablet 5  . gabapentin (NEURONTIN) 100 MG capsule Take 100 mg by mouth 4 (four) times daily.    . isosorbide mononitrate (IMDUR) 60 MG 24 hr tablet Take 60 mg by mouth daily.    [name] Kitchen JANTOVEN 5 MG tablet TAKE 1 TABLET (5 MG TOTAL) BY MOUTH DAILY. 90 tablet 0  . lisinopril (PRINIVIL,ZESTRIL) 5 MG tablet 1 tablet by  mouth every other day or as directed 90 tablet 3  . loratadine (CLARITIN) 10 MG tablet Take 10 mg by mouth daily.    . Multiple Vitamin (MULTIVITAMIN) tablet Take 1 tablet by mouth daily.      . Multiple Vitamins-Minerals (PRESERVISION AREDS 2) CAPS Take 1 capsule by mouth 2 (two) times daily.    . nitroGLYCERIN (NITROSTAT) 0.4 MG SL tablet DISSOLVE ONE TABLET UNDER THE TONGUE EVERY 5 MINUTES AS NEEDED FOR CHEST PAIN.  DO NOT EXCEED A TOTAL OF 3 DOSES IN 15 MINUTES 100 tablet 3  . nystatin ointment (MYCOSTATIN) Apply 1 application topically 2 (two) times daily. 30 g 1  . omeprazole (PRILOSEC) 20 MG capsule Take 1  capsule (20 mg total) by mouth 2 (two) times daily. 180 capsule 3  . polyethylene glycol (MIRALAX / GLYCOLAX) packet Take 17 g by mouth daily.     . rosuvastatin (CRESTOR) 10 MG tablet Take 1 tablet (10 mg total) by mouth daily. 90 tablet 3   No current facility-administered medications for this visit.       Objective:    BP 150/82 mmHg-->recheck 138/64  Pulse 61  Temp(Src) 97.2 F (36.2 C) (Oral)  Ht 5\' 11"  (1.803 m)  Wt 186 lb 3.2 oz (84.46 kg)  BMI 25.98 kg/m2  Wt Readings from Last 3 Encounters:  06/05/15 186 lb 3.2 oz (84.46 kg)  05/14/15 183 lb 6.4 oz (83.19 kg)  05/06/15 184 lb 9.6 oz (83.734 kg)     Gen: NAD, alert, cooperative with exam, NCAT EYES: EOMI, no scleral injection or icterus ENT:  L TM dull, R TM normal. OP without erythema LYMPH: no cervical LAD CV: NRRR, normal S1/S2, no murmur, distal pulses 2+ b/l Resp: CTABL, no wheezes, normal WOB Abd: +BS, soft, NTND.  Ext: No edema, warm Neuro: Alert and oriented Skin: slightly purple patches consistent with post-inflam hyperpigmentation present inner thighs b/l, improved  ,   Assessment & Plan:    Layth was seen today for URI symptoms, flu test was negative. No fevers. Discussed symptomatic care. Fungal skin infection is healing, discussed post-inflam changes may take months to fade. No more itching.  Diagnoses and all orders for this visit:  Acute URI  Flu-like symptoms -     POCT Influenza A/B  Tinea corporis  improving  Follow up plan: No Follow-up on file.  Assunta Found, MD [facility] 06/05/2015, 2:57 PM

## 2015-06-09 ENCOUNTER — Ambulatory Visit: Payer: Self-pay | Admitting: General Surgery

## 2015-06-09 ENCOUNTER — Other Ambulatory Visit (INDEPENDENT_AMBULATORY_CARE_PROVIDER_SITE_OTHER): Payer: Medicare Other | Admitting: *Deleted

## 2015-06-09 DIAGNOSIS — I255 Ischemic cardiomyopathy: Secondary | ICD-10-CM

## 2015-06-09 DIAGNOSIS — I4821 Permanent atrial fibrillation: Secondary | ICD-10-CM

## 2015-06-09 DIAGNOSIS — Z01812 Encounter for preprocedural laboratory examination: Secondary | ICD-10-CM

## 2015-06-09 LAB — CBC WITH DIFFERENTIAL/PLATELET
BASOS ABS: 0 10*3/uL (ref 0.0–0.1)
Basophils Relative: 0 % (ref 0–1)
EOS PCT: 2 % (ref 0–5)
Eosinophils Absolute: 0.1 10*3/uL (ref 0.0–0.7)
HEMATOCRIT: 42.2 % (ref 39.0–52.0)
Hemoglobin: 13.9 g/dL (ref 13.0–17.0)
LYMPHS ABS: 1.9 10*3/uL (ref 0.7–4.0)
LYMPHS PCT: 32 % (ref 12–46)
MCH: 30.1 pg (ref 26.0–34.0)
MCHC: 32.9 g/dL (ref 30.0–36.0)
MCV: 91.3 fL (ref 78.0–100.0)
MPV: 10.7 fL (ref 8.6–12.4)
Monocytes Absolute: 0.5 10*3/uL (ref 0.1–1.0)
Monocytes Relative: 9 % (ref 3–12)
NEUTROS PCT: 57 % (ref 43–77)
Neutro Abs: 3.3 10*3/uL (ref 1.7–7.7)
Platelets: 178 10*3/uL (ref 150–400)
RBC: 4.62 MIL/uL (ref 4.22–5.81)
RDW: 14.5 % (ref 11.5–15.5)
WBC: 5.8 10*3/uL (ref 4.0–10.5)

## 2015-06-09 LAB — BASIC METABOLIC PANEL
BUN: 12 mg/dL (ref 7–25)
CO2: 27 mmol/L (ref 20–31)
CREATININE: 0.98 mg/dL (ref 0.70–1.18)
Calcium: 8.7 mg/dL (ref 8.6–10.3)
Chloride: 98 mmol/L (ref 98–110)
Glucose, Bld: 82 mg/dL (ref 65–99)
Potassium: 5.4 mmol/L — ABNORMAL HIGH (ref 3.5–5.3)
SODIUM: 133 mmol/L — AB (ref 135–146)

## 2015-06-09 NOTE — H&P (Signed)
[name] 06/09/2015 10:50 AM Location: [facility] Patient #: [id number] DOB: Apr 18, 1941 Widowed / Language: [name] / Race: White Male   History of Present Illness Brett Hollingshead MD; 06/09/2015 11:23 AM) The patient is a 75 year old male.  Note:He [name] in for preoperative visit for open right inguinal hernia repair with mesh. He needs to have his pacemaker upgraded and this is scheduled for 6 days from now. Dr. Caryl [name] is going to be doing this. He was told to hold his Coumadin for 2 days prior to the procedure. He still having some stinging pain from the right inguinal hernia at times.  Allergies ([name], CMA; 06/09/2015 10:50 AM) Sulfa Antibiotics Ultram *ANALGESICS - OPIOID* Xarelto *ANTICOAGULANTS*  Medication History ([name], CMA; 06/09/2015 10:50 AM) ALPRAZolam (1MG  Tablet, Oral tid) Active. Doxepin HCl (25MG  Capsule, Oral tid) Active. Furosemide (20MG  Tablet, Oral prn) Active. Warfarin Sodium (5MG  Tablet, Oral daily) Active. Loratadine (10MG  Tablet, Oral daily) Active. Isosorbide Mononitrate ER (60MG  Tablet ER 24HR, Oral daily) Active. Omeprazole (20MG  Capsule DR, Oral bid) Active. PreserVision AREDS (2 capsules Oral daily) Active. Nitrostat (0.3MG  Tab Sublingual, Sublingual prn) Active. Lisinopril (5MG  Tablet, Oral daily) Active. (1 tablet eod) Multivitamin (Oral daily) Active. Acetaminophen (500MG  Tablet, Oral tid) Active. Medications Reconciled  Vitals ([name] CMA; 06/09/2015 10:50 AM) 06/09/2015 10:50 AM Weight: 182 lb Height: 69in Body Surface Area: 1.98 m Body Mass Index: 26.88 kg/m  Pulse: 76 (Regular)  BP: 128/78 (Sitting, Left Arm, Standard)       Physical Exam Brett Hollingshead MD; 06/09/2015 11:24 AM) The physical exam findings are as follows: Note:General-well-developed, well-nourished elderly male in no acute distress.  Abdomen-small umbilical hernia present.  GU-left groin scar present.  Right groin bulge that is reducible.    Assessment & Plan Brett Hollingshead MD; 06/09/2015 11:25 AM) RIGHT INGUINAL HERNIA (K40.90) Impression: Hernias symptomatic and moderate size. Due for pacemaker upgrade in 6 days.  Plan: Schedule his open right inguinal hernia repair with mesh for some time in mid March. Stop Coumadin 5 days prior to procedure.  Brett Confer, MD

## 2015-06-10 ENCOUNTER — Telehealth: Payer: Self-pay | Admitting: Internal Medicine

## 2015-06-10 LAB — PROTIME-INR
INR: 2.47 — ABNORMAL HIGH (ref <1.50)
Prothrombin Time: 27.1 seconds — ABNORMAL HIGH (ref 11.6–15.2)

## 2015-06-10 NOTE — Telephone Encounter (Signed)
[name] is calling because he is having a procedure on Monday and wants to know should he take a half of pill of his Ervin Knack) instead of a whole pill on Friday and dont take any on Saturday and Sunday .    Thanks

## 2015-06-10 NOTE — Telephone Encounter (Signed)
Pt is scheduled for a defibrillator upgrade to BI-V/CRT on 2/13. Pt called to see if he needs to take Jantoven medication 1/2 dose on Friday and 1/2 dose on Saturday so his blood wont be too thin. Pt was made aware that on the pre procedure instructions, pt is to take the last dose of Jantoven on Friday 06/12/15. Pt verbalized understanding.

## 2015-06-12 ENCOUNTER — Other Ambulatory Visit: Payer: Self-pay | Admitting: Internal Medicine

## 2015-06-14 MED ORDER — CEFAZOLIN SODIUM-DEXTROSE 2-3 GM-% IV SOLR: 2.0000 g | INTRAVENOUS | Status: DC

## 2015-06-14 MED ORDER — SODIUM CHLORIDE 0.9 % IR SOLN
80.0000 mg | Status: AC
  Administered 2015-06-15: 80 mg
  Filled 2015-06-14: qty 2

## 2015-06-14 MED ORDER — SODIUM CHLORIDE 0.9 % IR SOLN
80.0000 mg | Status: DC
  Filled 2015-06-14: qty 2

## 2015-06-14 MED ORDER — CEFAZOLIN SODIUM-DEXTROSE 2-3 GM-% IV SOLR
2.0000 g | INTRAVENOUS | Status: AC
  Administered 2015-06-15: 2 g via INTRAVENOUS

## 2015-06-15 ENCOUNTER — Encounter (HOSPITAL_COMMUNITY): Payer: Self-pay | Admitting: Internal Medicine

## 2015-06-15 ENCOUNTER — Encounter (HOSPITAL_COMMUNITY)
Admission: RE | Disposition: A | Discharge status: Home / Self Care | Payer: Self-pay | Source: Ambulatory Visit | Attending: Internal Medicine

## 2015-06-15 ENCOUNTER — Ambulatory Visit (HOSPITAL_COMMUNITY)
Admission: RE | Admit: 2015-06-15 | Discharge: 2015-06-16 | Disposition: A | Discharge status: Home / Self Care | Payer: Medicare Other | Source: Ambulatory Visit | Attending: Internal Medicine | Admitting: Internal Medicine

## 2015-06-15 DIAGNOSIS — I5022 Chronic systolic (congestive) heart failure: Secondary | ICD-10-CM | POA: Diagnosis not present

## 2015-06-15 DIAGNOSIS — I252 Old myocardial infarction: Secondary | ICD-10-CM | POA: Insufficient documentation

## 2015-06-15 DIAGNOSIS — M797 Fibromyalgia: Secondary | ICD-10-CM | POA: Diagnosis not present

## 2015-06-15 DIAGNOSIS — Z79899 Other long term (current) drug therapy: Secondary | ICD-10-CM | POA: Insufficient documentation

## 2015-06-15 DIAGNOSIS — Z951 Presence of aortocoronary bypass graft: Secondary | ICD-10-CM | POA: Insufficient documentation

## 2015-06-15 DIAGNOSIS — I110 Hypertensive heart disease with heart failure: Secondary | ICD-10-CM | POA: Insufficient documentation

## 2015-06-15 DIAGNOSIS — Z7901 Long term (current) use of anticoagulants: Secondary | ICD-10-CM | POA: Diagnosis not present

## 2015-06-15 DIAGNOSIS — I428 Other cardiomyopathies: Secondary | ICD-10-CM | POA: Diagnosis not present

## 2015-06-15 DIAGNOSIS — N400 Enlarged prostate without lower urinary tract symptoms: Secondary | ICD-10-CM | POA: Diagnosis not present

## 2015-06-15 DIAGNOSIS — Z959 Presence of cardiac and vascular implant and graft, unspecified: Secondary | ICD-10-CM

## 2015-06-15 DIAGNOSIS — I4892 Unspecified atrial flutter: Secondary | ICD-10-CM | POA: Insufficient documentation

## 2015-06-15 DIAGNOSIS — I482 Chronic atrial fibrillation: Secondary | ICD-10-CM | POA: Insufficient documentation

## 2015-06-15 DIAGNOSIS — I442 Atrioventricular block, complete: Secondary | ICD-10-CM | POA: Diagnosis present

## 2015-06-15 DIAGNOSIS — Z7951 Long term (current) use of inhaled steroids: Secondary | ICD-10-CM | POA: Diagnosis not present

## 2015-06-15 DIAGNOSIS — I255 Ischemic cardiomyopathy: Secondary | ICD-10-CM | POA: Diagnosis not present

## 2015-06-15 DIAGNOSIS — E785 Hyperlipidemia, unspecified: Secondary | ICD-10-CM | POA: Insufficient documentation

## 2015-06-15 DIAGNOSIS — Z95810 Presence of automatic (implantable) cardiac defibrillator: Secondary | ICD-10-CM

## 2015-06-15 DIAGNOSIS — Z87891 Personal history of nicotine dependence: Secondary | ICD-10-CM | POA: Insufficient documentation

## 2015-06-15 DIAGNOSIS — F329 Major depressive disorder, single episode, unspecified: Secondary | ICD-10-CM | POA: Insufficient documentation

## 2015-06-15 DIAGNOSIS — K219 Gastro-esophageal reflux disease without esophagitis: Secondary | ICD-10-CM | POA: Diagnosis not present

## 2015-06-15 DIAGNOSIS — I509 Heart failure, unspecified: Secondary | ICD-10-CM | POA: Diagnosis present

## 2015-06-15 DIAGNOSIS — I2510 Atherosclerotic heart disease of native coronary artery without angina pectoris: Secondary | ICD-10-CM | POA: Diagnosis not present

## 2015-06-15 HISTORY — PX: EP IMPLANTABLE DEVICE: SHX172B

## 2015-06-15 LAB — BASIC METABOLIC PANEL
ANION GAP: 5 (ref 5–15)
BUN: 12 mg/dL (ref 6–20)
CHLORIDE: 98 mmol/L — AB (ref 101–111)
CO2: 29 mmol/L (ref 22–32)
Calcium: 8.8 mg/dL — ABNORMAL LOW (ref 8.9–10.3)
Creatinine, Ser: 1.17 mg/dL (ref 0.61–1.24)
GFR calc Af Amer: >60 mL/min (ref >60)
GFR, EST NON AFRICAN AMERICAN: 60 mL/min — AB (ref >60)
GLUCOSE: 93 mg/dL (ref 65–99)
POTASSIUM: 4.5 mmol/L (ref 3.5–5.1)
Sodium: 132 mmol/L — ABNORMAL LOW (ref 135–145)

## 2015-06-15 LAB — PROTIME-INR
INR: 2.03 — ABNORMAL HIGH (ref 0.00–1.49)
Prothrombin Time: 22.9 seconds — ABNORMAL HIGH (ref 11.6–15.2)

## 2015-06-15 LAB — SURGICAL PCR SCREEN
MRSA, PCR: NEGATIVE
STAPHYLOCOCCUS AUREUS: POSITIVE — AB

## 2015-06-15 SURGERY — BIV UPGRADE

## 2015-06-15 MED ORDER — ISOSORBIDE MONONITRATE ER 60 MG PO TB24
60.0000 mg | ORAL_TABLET | Freq: Every day | ORAL | Status: DC
  Administered 2015-06-16: 60 mg via ORAL
  Filled 2015-06-15: qty 1

## 2015-06-15 MED ORDER — WARFARIN - PHYSICIAN DOSING INPATIENT
Freq: Every day | Status: DC
  Administered 2015-06-15: 18:00:00

## 2015-06-15 MED ORDER — ACETAMINOPHEN 500 MG PO TABS
500.0000 mg | ORAL_TABLET | Freq: Three times a day (TID) | ORAL | Status: DC | PRN
  Administered 2015-06-16: 500 mg via ORAL
  Filled 2015-06-15: qty 1

## 2015-06-15 MED ORDER — CEFAZOLIN SODIUM 1-5 GM-% IV SOLN
1.0000 g | Freq: Four times a day (QID) | INTRAVENOUS | Status: AC
  Administered 2015-06-15 – 2015-06-16 (×3): 1 g via INTRAVENOUS
  Filled 2015-06-15 (×4): qty 50

## 2015-06-15 MED ORDER — MIDAZOLAM HCL 5 MG/5ML IJ SOLN
INTRAMUSCULAR | Status: AC
  Filled 2015-06-15: qty 5

## 2015-06-15 MED ORDER — WARFARIN SODIUM 5 MG PO TABS
5.0000 mg | ORAL_TABLET | ORAL | Status: AC
  Administered 2015-06-15: 5 mg via ORAL
  Filled 2015-06-15: qty 1

## 2015-06-15 MED ORDER — GABAPENTIN 100 MG PO CAPS
100.0000 mg | ORAL_CAPSULE | Freq: Four times a day (QID) | ORAL | Status: DC
  Administered 2015-06-15 – 2015-06-16 (×4): 100 mg via ORAL
  Filled 2015-06-15 (×4): qty 1

## 2015-06-15 MED ORDER — DOXEPIN HCL 25 MG PO CAPS
25.0000 mg | ORAL_CAPSULE | Freq: Every morning | ORAL | Status: DC
  Administered 2015-06-16: 25 mg via ORAL
  Filled 2015-06-15 (×2): qty 1

## 2015-06-15 MED ORDER — POLYETHYLENE GLYCOL 3350 17 G PO PACK
17.0000 g | PACK | Freq: Every day | ORAL | Status: DC
  Administered 2015-06-15 – 2015-06-16 (×2): 17 g via ORAL
  Filled 2015-06-15 (×2): qty 1

## 2015-06-15 MED ORDER — FENTANYL CITRATE (PF) 100 MCG/2ML IJ SOLN
INTRAMUSCULAR | Status: AC
  Filled 2015-06-15: qty 2

## 2015-06-15 MED ORDER — PROSIGHT PO TABS
1.0000 | ORAL_TABLET | Freq: Every day | ORAL | Status: DC
  Filled 2015-06-15: qty 1

## 2015-06-15 MED ORDER — HYPROMELLOSE (GONIOSCOPIC) 2.5 % OP SOLN: 1.0000 [drp] | Freq: Three times a day (TID) | OPHTHALMIC | Status: DC | PRN

## 2015-06-15 MED ORDER — MIDAZOLAM HCL 5 MG/5ML IJ SOLN
INTRAMUSCULAR | Status: DC | PRN
  Administered 2015-06-15: 1 mg via INTRAVENOUS
  Administered 2015-06-15: 2 mg via INTRAVENOUS
  Administered 2015-06-15 (×2): 1 mg via INTRAVENOUS
  Administered 2015-06-15: 2 mg via INTRAVENOUS
  Administered 2015-06-15: 1 mg via INTRAVENOUS

## 2015-06-15 MED ORDER — MUPIROCIN 2 % EX OINT
TOPICAL_OINTMENT | CUTANEOUS | Status: AC
  Filled 2015-06-15: qty 22

## 2015-06-15 MED ORDER — FUROSEMIDE 20 MG PO TABS: 20.0000 mg | ORAL_TABLET | Freq: Every day | ORAL | Status: DC | PRN

## 2015-06-15 MED ORDER — LORATADINE 10 MG PO TABS
10.0000 mg | ORAL_TABLET | Freq: Every day | ORAL | Status: DC
  Administered 2015-06-16: 10 mg via ORAL
  Filled 2015-06-15: qty 1

## 2015-06-15 MED ORDER — ROSUVASTATIN CALCIUM 10 MG PO TABS
10.0000 mg | ORAL_TABLET | Freq: Every day | ORAL | Status: DC
  Administered 2015-06-16: 10 mg via ORAL
  Filled 2015-06-15: qty 1

## 2015-06-15 MED ORDER — CEFAZOLIN SODIUM-DEXTROSE 2-3 GM-% IV SOLR
INTRAVENOUS | Status: AC
  Filled 2015-06-15: qty 50

## 2015-06-15 MED ORDER — SODIUM CHLORIDE 0.9 % IR SOLN
Status: AC
  Filled 2015-06-15: qty 2

## 2015-06-15 MED ORDER — FLUTICASONE PROPIONATE 50 MCG/ACT NA SUSP
2.0000 | Freq: Every day | NASAL | Status: DC
  Filled 2015-06-15: qty 16

## 2015-06-15 MED ORDER — ONDANSETRON HCL 4 MG/2ML IJ SOLN: 4.0000 mg | Freq: Four times a day (QID) | INTRAMUSCULAR | Status: DC | PRN

## 2015-06-15 MED ORDER — DOXEPIN HCL 10 MG PO CAPS
50.0000 mg | ORAL_CAPSULE | Freq: Every day | ORAL | Status: DC
  Administered 2015-06-15: 50 mg via ORAL
  Filled 2015-06-15 (×4): qty 5

## 2015-06-15 MED ORDER — HEPARIN (PORCINE) IN NACL 2-0.9 UNIT/ML-% IJ SOLN
INTRAMUSCULAR | Status: AC
  Filled 2015-06-15: qty 500

## 2015-06-15 MED ORDER — SODIUM CHLORIDE 0.9 % IV SOLN: INTRAVENOUS | Status: AC

## 2015-06-15 MED ORDER — LIDOCAINE HCL (PF) 1 % IJ SOLN
INTRAMUSCULAR | Status: DC | PRN
  Administered 2015-06-15: 31 mL

## 2015-06-15 MED ORDER — ALPRAZOLAM 0.5 MG PO TABS
1.0000 mg | ORAL_TABLET | Freq: Three times a day (TID) | ORAL | Status: DC | PRN
Start: 2015-06-15 — End: 2015-06-16
  Administered 2015-06-15 – 2015-06-16 (×2): 1 mg via ORAL
  Filled 2015-06-15 (×2): qty 2

## 2015-06-15 MED ORDER — ADULT MULTIVITAMIN W/MINERALS CH
1.0000 | ORAL_TABLET | Freq: Every day | ORAL | Status: DC
  Administered 2015-06-16: 1 via ORAL
  Filled 2015-06-15 (×2): qty 1

## 2015-06-15 MED ORDER — CARVEDILOL 6.25 MG PO TABS
6.2500 mg | ORAL_TABLET | Freq: Two times a day (BID) | ORAL | Status: DC
  Administered 2015-06-15 – 2015-06-16 (×2): 6.25 mg via ORAL
  Filled 2015-06-15 (×2): qty 1

## 2015-06-15 MED ORDER — IOHEXOL 350 MG/ML SOLN
INTRAVENOUS | Status: DC | PRN
  Administered 2015-06-15: 20 mL via INTRAVENOUS
  Administered 2015-06-15: 10 mL via INTRAVENOUS
  Administered 2015-06-15: 25 mL via INTRACARDIAC

## 2015-06-15 MED ORDER — LISINOPRIL 5 MG PO TABS: 5.0000 mg | ORAL_TABLET | ORAL | Status: DC

## 2015-06-15 MED ORDER — MUPIROCIN 2 % EX OINT
TOPICAL_OINTMENT | Freq: Two times a day (BID) | CUTANEOUS | Status: DC
  Administered 2015-06-15: 22:00:00 via NASAL
  Filled 2015-06-15 (×2): qty 22

## 2015-06-15 MED ORDER — FENTANYL CITRATE (PF) 100 MCG/2ML IJ SOLN
INTRAMUSCULAR | Status: DC | PRN
  Administered 2015-06-15: 50 ug via INTRAVENOUS
  Administered 2015-06-15 (×4): 25 ug via INTRAVENOUS

## 2015-06-15 MED ORDER — PANTOPRAZOLE SODIUM 40 MG PO TBEC
40.0000 mg | DELAYED_RELEASE_TABLET | Freq: Every day | ORAL | Status: DC
  Administered 2015-06-16: 40 mg via ORAL
  Filled 2015-06-15: qty 1

## 2015-06-15 MED ORDER — HEPARIN (PORCINE) IN NACL 2-0.9 UNIT/ML-% IJ SOLN
INTRAMUSCULAR | Status: DC | PRN
  Administered 2015-06-15: 09:00:00

## 2015-06-15 MED ORDER — SODIUM CHLORIDE 0.9 % IV SOLN
INTRAVENOUS | Status: DC
  Administered 2015-06-15: 06:00:00 via INTRAVENOUS

## 2015-06-15 MED ORDER — LIDOCAINE HCL (PF) 1 % IJ SOLN
INTRAMUSCULAR | Status: AC
  Filled 2015-06-15: qty 30

## 2015-06-15 MED ORDER — PRESERVISION AREDS 2 PO CAPS: 2.0000 | ORAL_CAPSULE | Freq: Every day | ORAL | Status: DC

## 2015-06-15 MED ORDER — WARFARIN SODIUM 2.5 MG PO TABS: 2.5000 mg | ORAL_TABLET | ORAL | Status: DC

## 2015-06-15 MED ORDER — ACETAMINOPHEN 325 MG PO TABS: 325.0000 mg | ORAL_TABLET | ORAL | Status: DC | PRN

## 2015-06-15 SURGICAL SUPPLY — 16 items
ASSURA CRTD CD3369-40C (ICD Generator) ×3 IMPLANT
CABLE SURGICAL S-101-97-12 (CABLE) ×3 IMPLANT
CATH CPS DIRECT 135 DS2C020 (CATHETERS) ×3 IMPLANT
DEFIB ASSURA CRT-D (ICD Generator) ×1 IMPLANT
KIT ESSENTIALS PG (KITS) ×3 IMPLANT
LEAD DURATA 7122-65CM (Lead) ×3 IMPLANT
LEAD QUARTET 1458QL-86 (Lead) ×1 IMPLANT
PAD DEFIB LIFELINK (PAD) ×3 IMPLANT
QUARTET 1458QL-86 (Lead) ×3 IMPLANT
SHEATH CLASSIC 8F (SHEATH) ×3 IMPLANT
SHEATH CLASSIC 9.5F (SHEATH) ×3 IMPLANT
SHEATH CLASSIC 9F 25CM (SHEATH) ×3 IMPLANT
SLITTER UNIVERSAL DS2A003 (MISCELLANEOUS) ×3 IMPLANT
TRAY PACEMAKER INSERTION (PACKS) ×3 IMPLANT
WIRE ACUITY WHISPER EDS 4648 (WIRE) ×3 IMPLANT
WIRE HI TORQ VERSACORE-J 145CM (WIRE) ×3 IMPLANT

## 2015-06-15 NOTE — Discharge Instructions (Signed)
° ° °  Supplemental Discharge Instructions for  Pacemaker/Defibrillator Patients  Activity No heavy lifting or vigorous activity with your left/right arm for 6 to 8 weeks.  Do not raise your left/right arm above your head for one week.  Gradually raise your affected arm as drawn below.             06/20/15                     06/21/15                    06/22/15                    06/23/15 __  NO DRIVING for 1 week    ; you may begin driving on [id number]    .  WOUND CARE - Keep the wound area clean and dry.  Do not get this area wet for one week. No showers for one day; you may shower on  06/16/15   . - The tape/steri-strips on your wound will fall off; do not pull them off.  No bandage is needed on the site.  DO  NOT apply any creams, oils, or ointments to the wound area. - If you notice any drainage or discharge from the wound, any swelling or bruising at the site, or you develop a fever > 101? F after you are discharged home, call the office at once.  Special Instructions - You are still able to use cellular telephones; use the ear opposite the side where you have your pacemaker/defibrillator.  Avoid carrying your cellular phone near your device. - When traveling through airports, show security personnel your identification card to avoid being screened in the metal detectors.  Ask the security personnel to use the hand wand. - Avoid arc welding equipment, MRI testing (magnetic resonance imaging), TENS units (transcutaneous nerve stimulators).  Call the office for questions about other devices. - Avoid electrical appliances that are in poor condition or are not properly grounded. - Microwave ovens are safe to be near or to operate.  Additional information for defibrillator patients should your device go off: - If your device goes off ONCE and you feel fine afterward, notify the device clinic nurses. - If your device goes off ONCE and you do not feel well afterward, call 911. - If your device  goes off TWICE, call 911. - If your device goes off THREE times in one day, call 911.  DO NOT DRIVE YOURSELF OR A FAMILY MEMBER WITH A DEFIBRILLATOR TO THE HOSPITAL--CALL 911.

## 2015-06-15 NOTE — Discharge Summary (Signed)
ELECTROPHYSIOLOGY PROCEDURE DISCHARGE SUMMARY    Patient ID: [name],  MRN: [id number], DOB/AGE: May 24, 1940 75 y.o.  Admit date: 06/15/2015 Discharge date: 06/16/2015  Primary Care Physician: Eustaquio Maize, MD Primary Cardiologist: Dr. Mare Ferrari Electrophysiologist: Dr. Caryl Comes  Primary Discharge Diagnosis:  1. ICM, CHF  Secondary Discharge Diagnosis:  1. CHB  2. PAFib     CHADS2vasc is at least 5 on warfarin 3. CAD 4. HTN  Allergies  Allergen Reactions  . Sulfa Drugs Cross Reactors Other (See Comments)    unknown  . Ultram [Tramadol Hcl] Itching    "don't remember how bad"  . Xarelto [Rivaroxaban] Other (See Comments)    Itching      Procedures This Admission:  1.  Implantation of a STJ CRT-D chamber ICD on 06/15/15 by Dr Caryl Comes.  The patient had explant of his PPM generator with upgrade to received a [place] ICD serial [id number]. With [facility] [id number] single coil defibrillator lead, serial number [id number].and [place] [id number] Q lead, serial number [id number] left ventricular lead.  DFT's were deferred at time of implant.  There were no immediate post procedure complications. 2.  CXR on 2/14 demonstrated no pneumothorax status post device implantation.   Brief HPI: [name] is a 75 y.o. male was referred to electrophysiology in the outpatient setting for consideration of ICD implantation.  Past medical history includes CHB with PPM originally, CAD, ICM with chronic CHF, HTN,  The patient has persistent LV dysfunction despite guideline directed therapy.  Risks, benefits, and alternatives to ICD implantation were reviewed with the patient who wished to proceed.   Hospital Course:  The patient was admitted and underwent PPM generator removal with upgrade to CRT-D  with details as outlined above. He was monitored on telemetry overnight which demonstrated SR, V pacing.  Left chest was without hematoma or ecchymosis.  The device was interrogated and found to  be functioning normally.  CXR was obtained and demonstrated no pneumothorax status post device implantation.  Wound care, arm mobility, and restrictions were reviewed with the patient.  The patient was examined and considered stable for discharge to home.   His next INR is monitored with his PMD, discussed with patient/daughter, they will call the office to arrange an INR Tuesday next week.  No changes to his home regime, medicines.  The patient's discharge medications include an ACE-I (prinivil) and beta blocker (carvedilol).   Physical Exam: Filed Vitals:   06/15/15 1100 06/15/15 1256 06/15/15 2049 06/16/15 0622  BP: 97/62 94/66 125/67 146/71  Pulse: 70 69 70 70  Temp: 97.5 F (36.4 C) 97.5 F (36.4 C)  97.4 F (36.3 C)  TempSrc:  Oral  Oral  Resp: 18 18 18 18   Height:      Weight:      SpO2: 92% 100% 98% 100%    GEN- The patient is well appearing, alert and oriented x 3 today.   HEENT: normocephalic, atraumatic; sclera clear, conjunctiva pink; hearing intact; oropharynx clear Lungs- Clear to ausculation bilaterally, normal work of breathing.  No wheezes, rales, rhonchi Heart- Regular rate and rhythm, no murmurs, rubs or gallops, PMI not laterally displaced GI- soft, non-tender, non-distended, bowel sounds present Extremities- no clubbing, cyanosis, or edema MS- no significant deformity or atrophy Skin- warm and dry, no rash or lesion, left chest without hematoma/ecchymosis Psych- euthymic mood, full affect Neuro- no gross defecits  Labs:   Lab Results  Component Value Date   WBC 5.8 06/09/2015  HGB 13.9 06/09/2015   HCT 42.2 06/09/2015   MCV 91.3 06/09/2015   PLT 178 06/09/2015     Recent Labs Lab 06/15/15 0624  NA 132*  K 4.5  CL 98*  CO2 29  BUN 12  CREATININE 1.17  CALCIUM 8.8*  GLUCOSE 93    Discharge Medications:    Medication List    TAKE these medications        acetaminophen 500 MG tablet  Commonly known as:  TYLENOL  Take 500 mg by mouth  every 8 (eight) hours as needed for mild pain.     ALPRAZolam 1 MG tablet  Commonly known as:  XANAX  Take 1 tablet (1 mg total) by mouth 3 (three) times daily as needed. For anxiety     carvedilol 6.25 MG tablet  Commonly known as:  COREG  Take 1 tablet by mouth 2 (two) times daily.     ciprofloxacin 0.3 % ophthalmic solution  Commonly known as:  CILOXAN     doxepin 25 MG capsule  Commonly known as:  SINEQUAN  Take 25-50 mg by mouth 2 (two) times daily. Take 25 mg (1 capsule) by mouth in the morning and 50 mg (2 capsules) by mouth at night.     fluticasone 50 MCG/ACT nasal spray  Commonly known as:  FLONASE  Place 2 sprays into both nostrils daily.     furosemide 20 MG tablet  Commonly known as:  LASIX  Take 1 tablet (20 mg total) by mouth daily as needed for fluid. One tablet by mouth daily as needed for swelling     gabapentin 100 MG capsule  Commonly known as:  NEURONTIN  Take 100 mg by mouth 4 (four) times daily.     hydroxypropyl methylcellulose / hypromellose 2.5 % ophthalmic solution  Commonly known as:  ISOPTO TEARS / GONIOVISC  Place 1 drop into both eyes 3 (three) times daily as needed for dry eyes.     isosorbide mononitrate 60 MG 24 hr tablet  Commonly known as:  IMDUR  Take 60 mg by mouth daily.     JANTOVEN 5 MG tablet  Generic drug:  warfarin  Take 2.5-5 mg by mouth daily. Take 1 tablet all days except Saturday take 0.5 tablet     JANTOVEN 5 MG tablet  Generic drug:  warfarin  TAKE 1 TABLET (5 MG TOTAL) BY MOUTH DAILY.     lisinopril 5 MG tablet  Commonly known as:  PRINIVIL,ZESTRIL  Take 1 tablet (5 mg total) by mouth every other day. 1 tablet by mouth every other day or as directed     loratadine 10 MG tablet  Commonly known as:  CLARITIN  Take 10 mg by mouth daily.     multivitamin tablet  Take 1 tablet by mouth daily.     nitroGLYCERIN 0.4 MG SL tablet  Commonly known as:  NITROSTAT  DISSOLVE ONE TABLET UNDER THE TONGUE EVERY 5 MINUTES AS  NEEDED FOR CHEST PAIN.  DO NOT EXCEED A TOTAL OF 3 DOSES IN 15 MINUTES     nystatin ointment  Commonly known as:  MYCOSTATIN  Apply 1 application topically 2 (two) times daily.     omeprazole 20 MG capsule  Commonly known as:  PRILOSEC  Take 1 capsule (20 mg total) by mouth 2 (two) times daily.     polyethylene glycol packet  Commonly known as:  MIRALAX / GLYCOLAX  Take 17 g by mouth daily.     PRESERVISION AREDS  2 Caps  Take 2 capsules by mouth daily.     rosuvastatin 10 MG tablet  Commonly known as:  CRESTOR  Take 1 tablet (10 mg total) by mouth daily.        Disposition:   Follow-up Information    Follow up with [facility] On 06/25/2015.   Specialty:  Cardiology   Why:  9:30AM, wound check   Contact information:   [address], [place] [phone]      Follow up with Virl Axe, MD On 09/15/2015.   Specialty:  Cardiology   Why:  3:00PM   Contact information:   [id number] [place] [id number] [phone]       Duration of Discharge Encounter: Greater than 30 minutes including physician time.  Venetia Night, PA-C 06/16/2015 8:04 AM  \EP Attending  Patient seen and examined. Agree with the findings as noted by Tommye Standard, PA-C. The patient's ICD is working normally and CXR looks good. He will be discharged home with usual followup.  Mikle Bosworth.D.

## 2015-06-15 NOTE — Progress Notes (Signed)
06/15/2015 1055 Received pt to 2w16 from EP Lab.  Pt is A&O, no c/o voiced.  Tele monitor applied and CCMD notified.  Oriented to room, call light and bed.  Call bell in reach. [place]

## 2015-06-15 NOTE — H&P (Addendum)
.      Patient Care Team: Eustaquio Maize, MD as PCP - General (Pediatrics)   HPI  [name] is a 75 y.o. male wotj hx of ischemic and nonischemic cardiomyopathyy  Hx of EF 40-45% prior to pacing 2012 following CHB during ablation for AFl  Also hx of CABG and redo CABG 2007 and most recent myoview 2016 inferior infarct without ischemia  Cath 12/16>> patent grafts  Class 3 CHF;  He has permanent Atrial Fibrillation with last cardioversion 1/14  Rx with coumadin  Echo 8/15 EF 30%  Plan is upgrade to CRT-D for persistent LV dysfunction and 100% Vpacing     Past Medical History  Diagnosis Date  . Ischemic cardiomyopathy     a. 06/2012 EF 25% by echo.  . Hypertension   . Hyperlipidemia   . GERD (gastroesophageal reflux disease)   . CAD (coronary artery disease)     a. 1991 s/p CABGx4;  b. redo CABG in 2007 (SVG->OM, VG->PDA->PLV);  c. 01/2011 Cath: LM 80ost, LAD 100p, LCX 100ost, OM1 100, RCA 100, LIMA->LAD ok, VG->PDA ok, VG->OM ok;  d. 03/2012 MV: EF 32% inf & inflat scar w/o ischemia; e. Lexi MV: EF 33%, inf infarct, no ischemia. f. lexiscan 06/12/2014 RCA scar, no ischemia  . Atrial flutter ([place])     a. 01/2011 s/p unsuccessful RFCA complicated by CHB req PPM.  . CHB (complete heart block) ([place])     a. 01/2011: in setting of RFCA, s/p [facility] 2110 Accent DC PPM, ser # [id number].  [name] Kitchen Kidney cysts     a. bilateral  . Hiatal hernia     a. 03/01/2013 EGD and esoph dil.  [name] Kitchen BPH (benign prostatic hyperplasia)     a. elevated PSA  . A-fib (HCC)     a. chronic coumadin  . Elevated PSA, less than 10 ng/ml   . Fibromyalgia   . Skin cancer     'above left eyebrow; tip of my nose" (08/29/2012)  . Depression     "since 1986-1987" (08/29/2012)  . Anxiety   . Panic attacks   . Chronic systolic CHF (congestive heart failure), NYHA class 2 ([place])     a. 06/2012 Echo: EF 25%, distal sept and apical AK, sev dil LV, mild LVH, mod dil LA.  [name] Kitchen Pacemaker     Past Surgical History   Procedure Laterality Date  . Cardiac catheterization  10/30/2009    Grafts patent. Mild to moderate LV dysfunction. EF 45%. Managed medically.   . Cardiac catheterization      multiple  . Tonsillectomy  1960  . Cholecystectomy  2007  . Insert / replace / remove pacemaker  01/2011    initial placement  . Inguinal hernia repair  1960's    "left I think"  . Cataract extraction w/ intraocular lens  implant, bilateral  2012  . Cardioversion  05/25/2012    Procedure: CARDIOVERSION;  Surgeon: Darlin Coco, MD;  Location: [facility] ENDOSCOPY;  Service: Cardiovascular;  Laterality: N/A;  . Humerus surgery Left 1960    "growth on the bone taken off; not cancer" (08/29/2012)  . Excisional hemorrhoidectomy  ~ 1980  . Skin cancer excision      'above left eyebrow; tip of my nose" (08/29/2012)  . Atrial flutter ablation  01/2011    "didn't work" (08/29/2012)  . Coronary artery bypass graft  08/27/1989    CABG X 5  . Coronary artery bypass graft  06/2005    CABG X 3  .  Cardiac catheterization N/A 04/28/2015    Procedure: Left Heart Cath and Cors/Grafts Angiography;  Surgeon: Belva Crome, MD;  Location: [facility];  Service: Cardiovascular;  Laterality: N/A;    Current Facility-Administered Medications  Medication Dose Route Frequency Provider Last Rate Last Dose  . 0.9 %  sodium chloride infusion   Intravenous Continuous Deboraha Sprang, MD 50 mL/hr at 06/15/15 0617    . 0.9 %  sodium chloride infusion   Intravenous Continuous Deboraha Sprang, MD 50 mL/hr at 06/15/15 0617    . ceFAZolin (ANCEF) IVPB 2 g/50 mL premix  2 g Intravenous On Call to OR Jackolyn Confer, MD      . gentamicin (GARAMYCIN) 80 mg in sodium chloride irrigation 0.9 % 500 mL irrigation  80 mg Irrigation On Call Deboraha Sprang, MD      . mupirocin ointment Drue Stager) 2 %   Nasal BID Deboraha Sprang, MD      . mupirocin ointment (BACTROBAN) 2 %             Allergies  Allergen Reactions  . Sulfa Drugs Cross Reactors Other (See  Comments)    unknown  . Ultram [Tramadol Hcl] Itching    "don't remember how bad"  . Xarelto [Rivaroxaban] Other (See Comments)    Itching      Medication List    ASK your doctor about these medications        acetaminophen 500 MG tablet  Commonly known as:  TYLENOL  Take 500 mg by mouth every 8 (eight) hours as needed for mild pain.     ALPRAZolam 1 MG tablet  Commonly known as:  XANAX  Take 1 tablet (1 mg total) by mouth 3 (three) times daily as needed. For anxiety     carvedilol 6.25 MG tablet  Commonly known as:  COREG  Take 1 tablet by mouth 2 (two) times daily.     ciprofloxacin 0.3 % ophthalmic solution  Commonly known as:  CILOXAN     doxepin 25 MG capsule  Commonly known as:  SINEQUAN  Take 25-50 mg by mouth 2 (two) times daily. Take 25 mg (1 capsule) by mouth in the morning and 50 mg (2 capsules) by mouth at night.     fluticasone 50 MCG/ACT nasal spray  Commonly known as:  FLONASE  Place 2 sprays into both nostrils daily.     furosemide 20 MG tablet  Commonly known as:  LASIX  One tablet by mouth daily as needed for swelling     gabapentin 100 MG capsule  Commonly known as:  NEURONTIN  Take 100 mg by mouth 4 (four) times daily.     hydroxypropyl methylcellulose / hypromellose 2.5 % ophthalmic solution  Commonly known as:  ISOPTO TEARS / GONIOVISC  Place 1 drop into both eyes 3 (three) times daily as needed for dry eyes.     isosorbide mononitrate 60 MG 24 hr tablet  Commonly known as:  IMDUR  Take 60 mg by mouth daily.     JANTOVEN 5 MG tablet  Generic drug:  warfarin  Take 2.5-5 mg by mouth daily. Take 1 tablet all days except Saturday take 0.5 tablet     JANTOVEN 5 MG tablet  Generic drug:  warfarin  TAKE 1 TABLET (5 MG TOTAL) BY MOUTH DAILY.     lisinopril 5 MG tablet  Commonly known as:  PRINIVIL,ZESTRIL  1 tablet by mouth every other day or as directed  loratadine 10 MG tablet  Commonly known as:  CLARITIN  Take 10 mg by mouth daily.      multivitamin tablet  Take 1 tablet by mouth daily.     nitroGLYCERIN 0.4 MG SL tablet  Commonly known as:  NITROSTAT  DISSOLVE ONE TABLET UNDER THE TONGUE EVERY 5 MINUTES AS NEEDED FOR CHEST PAIN.  DO NOT EXCEED A TOTAL OF 3 DOSES IN 15 MINUTES     nystatin ointment  Commonly known as:  MYCOSTATIN  Apply 1 application topically 2 (two) times daily.     omeprazole 20 MG capsule  Commonly known as:  PRILOSEC  Take 1 capsule (20 mg total) by mouth 2 (two) times daily.     polyethylene glycol packet  Commonly known as:  MIRALAX / GLYCOLAX  Take 17 g by mouth daily.     PRESERVISION AREDS 2 Caps  Take 2 capsules by mouth daily.     rosuvastatin 10 MG tablet  Commonly known as:  CRESTOR  Take 1 tablet (10 mg total) by mouth daily.       Social History  Substance Use Topics  . Smoking status: Former Smoker -- 0.00 packs/day for 0 years    Types: Cigarettes    Quit date: 09/21/1973  . Smokeless tobacco: Never Used  . Alcohol Use: No   Family History  Problem Relation Age of Onset  . Stroke Mother   . Heart attack Father     died @ 71, mult MI's.  [name] Kitchen Heart disease Father   . Hypertension Father   . Diabetes Sister       Review of Systems negative except from HPI and PMH  Physical Exam BP 116/70 mmHg  Pulse 74  Temp(Src) 97.9 F (36.6 C) (Oral)  Resp 16  Ht 5\' 11"  (1.803 m)  Wt 185 lb (83.915 kg)  BMI 25.81 kg/m2  SpO2 100% Well developed and well nourished in no acute distress HENT normal E scleral and icterus clear Neck Supple JVP flat; carotids brisk and full Clear to ausculation Device pocket well healed; without hematoma or erythema.  There is no tethering  No superficial collateral s Regular rate and rhythm, no murmurs gallops or rub Soft with active bowel sounds No clubbing cyanosis  Edema Alert and oriented, grossly normal motor and sensory function Skin Warm and Dry  ECG AFib Vpacing  Assessment and  Plan Ischemic and non ischemic  cardiomypathy  CHF chronic sys  Vpacing 100%  CHB    Atrial Fib permanent    for device upgrade today for ICD given long standing persistent LV dysfunction and CRT given 100 V [pacing   We have reviewed the benefits and risks of generator replacement.  These include but are not limited to lead fracture and infection.  The patient understands, agrees and is willing to proceed.

## 2015-06-15 NOTE — Op Note (Signed)
NAME:  [name], [name] NO.:  [id number]  MEDICAL RECORD NO.:  [id number]  LOCATION:  [id number]                        FACILITY:  [facility]  PHYSICIAN:  Deboraha Sprang, MD, FACCDATE OF BIRTH:  [birth year]  DATE OF PROCEDURE:  06/15/2015 DATE OF DISCHARGE:                              OPERATIVE REPORT   PREOPERATIVE DIAGNOSES:  Congestive heart failure, complete heart block, ischemic and nonischemic cardiomyopathy.  POSTOPERATIVE DIAGNOSIS:  Congestive heart failure, complete heart block, ischemic and nonischemic cardiomyopathy.  PROCEDURE:  Explantation of a previously implanted device, contrast venogram, implantation of a single-chamber defibrillator, with a left ventricular lead, and high-voltage lead assessment.  DESCRIPTION OF PROCEDURE:  Following obtaining informed consent, the patient was brought to electrophysiology laboratory and placed on the fluoroscopic table in the supine position.  Contrast venogram x2 were undertaken to look at the patency of the extrathoracic left subclavian vein.  The first suggested that it was occluded, the second demonstrated that it was patent.  After routine prep and drape, and lidocaine was infiltrated over the previous pocket.  Incision was made and carried down to layer of device pocket using sharp dissection of electrocautery. The patient was device-dependent. The old system was left in place. Venous access was obtained without difficulty without the aspiration or puncture of the artery.  Two separate venipunctures were accomplished and sequentially an 8-French and 9.5-French sheath were placed.  The 8-French sheath, however, did not allow for passage of the high- voltage lead, past the junction of the innominate and the SVC so it was exchanged out for a long 9-French sheath, through which was then passed a [facility] [id number] single coil defibrillator lead, serial number [id number].  Under fluoroscopic guidance, it was  manipulated the RV apex, where there was a paced R waves of about 5 mV.  The threshold was less than 1 V.  Impedances were in the 800 range (see below).  We then turned our attention to gain access to the coronary sinus, which was accomplished without difficulty.  Sub selection of a high lateral branch occurred serendipitously.  A venogram was obtained.  There was a long vein and a [facility] Q lead, serial number [id number] was manipulated to the distal ramification of this vein, which was at the junction of the distal and mid third with some posterior turning.  In this location, we found diaphragmatic stimulation off pole 4 and 3, but not off pole 2.  Other terminal ramifications were sought, but not successfully cannulated.  V/Q LV in this region was 110 milliseconds.  It is noteworthy that this was a paced LV.  This lead was secured to the prepectoral fascia and then the previously implanted pacemaker was explanted.  The previous RV pacing lead was capped.  The pocket was extended to house a larger pulse generator. Antibiotics copiously irrigated the pocket.  The leads were then attached to a [employer] ICD serial [id number].  Through the device, a bipolar fibrillation wave was 1.7 with an atrial impedance of 410.  The RV amplitudes were absent as there was no intrinsic rhythm.  RV impedance was 710, threshold 0.5 at 0.5.  LV impedance and a 2-coil configuration  was 510 with a threshold of 1 V at 0.5 millisecond.  High-voltage impedance was 72 ohms.  With these acceptable parameters recorded, the device was implanted. The leads were placed in the prepectoral fascia.  The device was secured to the prepectoral fascia and the wound was closed in 2 layers in normal fashion.  The wound was washed, dried, and a Dermabond dressing was applied.  Needle counts, sponge counts, and instrument counts were correct at the end of the procedure according to the staff.  The  patient tolerated the procedure without apparent difficulty.     Deboraha Sprang, MD, [facility]     SCK/MEDQ  D:  06/15/2015  T:  06/15/2015  Job:  [id number]

## 2015-06-16 ENCOUNTER — Ambulatory Visit (HOSPITAL_COMMUNITY): Payer: Medicare Other

## 2015-06-16 DIAGNOSIS — Z95810 Presence of automatic (implantable) cardiac defibrillator: Secondary | ICD-10-CM | POA: Diagnosis not present

## 2015-06-16 DIAGNOSIS — I442 Atrioventricular block, complete: Secondary | ICD-10-CM | POA: Diagnosis not present

## 2015-06-16 DIAGNOSIS — I428 Other cardiomyopathies: Secondary | ICD-10-CM | POA: Diagnosis not present

## 2015-06-16 DIAGNOSIS — I5022 Chronic systolic (congestive) heart failure: Secondary | ICD-10-CM | POA: Diagnosis not present

## 2015-06-16 DIAGNOSIS — I110 Hypertensive heart disease with heart failure: Secondary | ICD-10-CM | POA: Diagnosis not present

## 2015-06-16 MED ORDER — FUROSEMIDE 20 MG PO TABS: 20.0000 mg | ORAL_TABLET | Freq: Every day | ORAL | Status: DC | PRN

## 2015-06-16 MED ORDER — LISINOPRIL 5 MG PO TABS: 5.0000 mg | ORAL_TABLET | ORAL | Status: DC

## 2015-06-16 NOTE — Progress Notes (Signed)
Reviewed discharge instructions with family and patient. No issues at present. Will continue to monitor. IV removed.   Novalynn Branaman, Mervin Kung RN

## 2015-06-17 ENCOUNTER — Telehealth: Payer: Self-pay | Admitting: *Deleted

## 2015-06-17 NOTE — Telephone Encounter (Signed)
[facility] rep, Sandie, aware that patient needs cell adapter.  She will order for patient.

## 2015-06-17 NOTE — Telephone Encounter (Signed)
Patient's daughter called stating that her father came home from the hospital today and has the wrong monitor for his device.  Clarified that patient does not have a landline in his bedroom where his monitor should be set up.  Advised patient's daughter to set up patient's monitor in another room for now and that I would contact [facility] rep to order cell adapter so that patient can set monitor up in his bedroom.  Patient's daughter verbalizes understanding of instructions and denies additional questions at this time.

## 2015-06-18 ENCOUNTER — Encounter (HOSPITAL_COMMUNITY): Payer: Self-pay | Admitting: Internal Medicine

## 2015-06-19 ENCOUNTER — Telehealth: Payer: Self-pay | Admitting: Internal Medicine

## 2015-06-19 NOTE — Telephone Encounter (Signed)
New message     Pt had a device put in on Monday.  This am he swept something off of the floor without thinking that he should still be taking it easy.  Calling to make sure he did not pull the leads out of place or do any damage.  Please call

## 2015-06-19 NOTE — Telephone Encounter (Signed)
Returned patient's call. Patient concerned that he may have over exerted himself after sweeping up some cereal this morning. He says he feels no different now than prior to the sweeping. I assured him that sweeping was unlikely to affect the leads and that we want him to avoid lifting anything heavier than a gallon of milk and to avoid stretching motions across the chest. I mentioned that [employer] was going to send a cell adapter for his monitor because it wasn't in the box at implant. He questioned whether or not he could shower- I advised him to take a bird bath until Monday and then he can shower. He verbalizes understanding. I advised him to call back if he has any more concerns.

## 2015-06-23 ENCOUNTER — Ambulatory Visit (INDEPENDENT_AMBULATORY_CARE_PROVIDER_SITE_OTHER): Payer: Medicare Other | Admitting: Pharmacist Clinician (PhC)/ Clinical Pharmacy Specialist

## 2015-06-23 DIAGNOSIS — I482 Chronic atrial fibrillation, unspecified: Secondary | ICD-10-CM

## 2015-06-23 LAB — POCT INR: INR: 1.8

## 2015-06-23 MED ORDER — WARFARIN SODIUM 5 MG PO TABS: 2.5000 mg | ORAL_TABLET | Freq: Every day | ORAL | Status: DC

## 2015-06-23 NOTE — Addendum Note (Signed)
Addended by: Memory Argue on: 06/23/2015 11:23 AM   Modules accepted: Orders, Medications

## 2015-06-23 NOTE — Patient Instructions (Signed)
Anticoagulation Dose Instructions as of 06/23/2015      [name] Tue Wed Thu Fri Sat   New Dose 5 mg 5 mg 5 mg 5 mg 5 mg 5 mg 2.5 mg    Description        Take 1 1/2 tablets today and then tomorrow resume your regular schedule  INR was 1.8 today  Slightly thick  Goal is 2-3

## 2015-06-25 ENCOUNTER — Ambulatory Visit (INDEPENDENT_AMBULATORY_CARE_PROVIDER_SITE_OTHER): Payer: Medicare Other | Admitting: *Deleted

## 2015-06-25 ENCOUNTER — Encounter: Payer: Self-pay | Admitting: Internal Medicine

## 2015-06-25 ENCOUNTER — Ambulatory Visit (HOSPITAL_COMMUNITY)
Admission: RE | Admit: 2015-06-25 | Discharge: 2015-06-25 | Disposition: A | Discharge status: Home / Self Care | Payer: Medicare Other | Source: Ambulatory Visit | Attending: Internal Medicine | Admitting: Internal Medicine

## 2015-06-25 DIAGNOSIS — Z959 Presence of cardiac and vascular implant and graft, unspecified: Secondary | ICD-10-CM | POA: Diagnosis not present

## 2015-06-25 DIAGNOSIS — I517 Cardiomegaly: Secondary | ICD-10-CM | POA: Diagnosis not present

## 2015-06-25 DIAGNOSIS — I5022 Chronic systolic (congestive) heart failure: Secondary | ICD-10-CM | POA: Diagnosis not present

## 2015-06-25 DIAGNOSIS — Z95810 Presence of automatic (implantable) cardiac defibrillator: Secondary | ICD-10-CM | POA: Diagnosis not present

## 2015-06-25 DIAGNOSIS — Z951 Presence of aortocoronary bypass graft: Secondary | ICD-10-CM | POA: Diagnosis not present

## 2015-06-25 LAB — CUP PACEART INCLINIC DEVICE CHECK
Brady Statistic RA Percent Paced: 0 %
HighPow Impedance: 63 Ohm
Implantable Lead Implant Date: 20170213
Implantable Lead Implant Date: 20170213
Implantable Lead Location: 753859
Implantable Lead Location: 753860
Implantable Lead Model: 7122
Lead Channel Impedance Value: 375 Ohm
Lead Channel Pacing Threshold Amplitude: 1 V
Lead Channel Pacing Threshold Pulse Width: 0.5 ms
Lead Channel Setting Pacing Amplitude: 3.5 V
Lead Channel Setting Pacing Pulse Width: 0.8 ms
Lead Channel Setting Sensing Sensitivity: 2 mV
MDC IDC LEAD IMPLANT DT: 20120927
MDC IDC LEAD LOCATION: 753858
MDC IDC MSMT BATTERY REMAINING LONGEVITY: 44.4
MDC IDC MSMT LEADCHNL LV PACING THRESHOLD AMPLITUDE: 2.5 V
MDC IDC MSMT LEADCHNL LV PACING THRESHOLD PULSEWIDTH: 0.8 ms
MDC IDC MSMT LEADCHNL RA IMPEDANCE VALUE: 400 Ohm
MDC IDC MSMT LEADCHNL RV IMPEDANCE VALUE: 637.5 Ohm
MDC IDC MSMT LEADCHNL RV PACING THRESHOLD AMPLITUDE: 1 V
MDC IDC MSMT LEADCHNL RV PACING THRESHOLD PULSEWIDTH: 0.5 ms
MDC IDC MSMT LEADCHNL RV SENSING INTR AMPL: 11.8 mV
MDC IDC PG SERIAL: 7335682
MDC IDC SESS DTM: 20170223110249
MDC IDC SET LEADCHNL LV PACING AMPLITUDE: 3.375
MDC IDC SET LEADCHNL RV PACING PULSEWIDTH: 0.5 ms
MDC IDC STAT BRADY RV PERCENT PACED: 97 %

## 2015-06-25 NOTE — Progress Notes (Signed)
Wound check appointment s/p BIV upgrade 06/15/15. Dermabond removed. Wound without redness or edema. Incision edges approximated, wound well healed. Normal device function. RV threshold, sensing, and impedances consistent with implant measurements. LV threshold increased from 1.25@0 .5 to 3.25@0 .5- pulse width increased from 0.5 to 0.34ms (I left vector programming alone- multi vector testing performed and results printed). CXR ordered. Patient very upset by this finding and thinks he will have to go back to surgery- I explained that this finding is not uncommon and that the lead may not have moved in position and that there are several options that we have prior to resorting to lead revision. Device programmed with autocapture on for extra safety margin until 3 month visit. Histogram distribution appropriate for patient and level of activity. No ventricular arrhythmias noted. Patient educated about wound care, arm mobility, lifting restrictions, shock plan. ROV with SK 5/16 at 3pm.

## 2015-06-26 ENCOUNTER — Encounter (HOSPITAL_COMMUNITY): Payer: Self-pay | Admitting: *Deleted

## 2015-06-26 ENCOUNTER — Emergency Department (HOSPITAL_COMMUNITY)
Admission: EM | Admit: 2015-06-26 | Discharge: 2015-06-26 | Disposition: A | Discharge status: Home / Self Care | Payer: Medicare Other | Attending: Emergency Medicine | Admitting: Emergency Medicine

## 2015-06-26 DIAGNOSIS — R0789 Other chest pain: Secondary | ICD-10-CM | POA: Diagnosis not present

## 2015-06-26 DIAGNOSIS — F329 Major depressive disorder, single episode, unspecified: Secondary | ICD-10-CM | POA: Insufficient documentation

## 2015-06-26 DIAGNOSIS — K219 Gastro-esophageal reflux disease without esophagitis: Secondary | ICD-10-CM | POA: Insufficient documentation

## 2015-06-26 DIAGNOSIS — Q6100 Congenital renal cyst, unspecified: Secondary | ICD-10-CM | POA: Insufficient documentation

## 2015-06-26 DIAGNOSIS — Z792 Long term (current) use of antibiotics: Secondary | ICD-10-CM | POA: Diagnosis not present

## 2015-06-26 DIAGNOSIS — R079 Chest pain, unspecified: Secondary | ICD-10-CM | POA: Diagnosis not present

## 2015-06-26 DIAGNOSIS — I10 Essential (primary) hypertension: Secondary | ICD-10-CM | POA: Diagnosis not present

## 2015-06-26 DIAGNOSIS — M797 Fibromyalgia: Secondary | ICD-10-CM | POA: Insufficient documentation

## 2015-06-26 DIAGNOSIS — Z79899 Other long term (current) drug therapy: Secondary | ICD-10-CM | POA: Insufficient documentation

## 2015-06-26 DIAGNOSIS — Z85828 Personal history of other malignant neoplasm of skin: Secondary | ICD-10-CM | POA: Diagnosis not present

## 2015-06-26 DIAGNOSIS — Z951 Presence of aortocoronary bypass graft: Secondary | ICD-10-CM | POA: Insufficient documentation

## 2015-06-26 DIAGNOSIS — E785 Hyperlipidemia, unspecified: Secondary | ICD-10-CM | POA: Insufficient documentation

## 2015-06-26 DIAGNOSIS — I2510 Atherosclerotic heart disease of native coronary artery without angina pectoris: Secondary | ICD-10-CM | POA: Insufficient documentation

## 2015-06-26 DIAGNOSIS — F410 Panic disorder [episodic paroxysmal anxiety] without agoraphobia: Secondary | ICD-10-CM | POA: Diagnosis not present

## 2015-06-26 DIAGNOSIS — Z87891 Personal history of nicotine dependence: Secondary | ICD-10-CM | POA: Diagnosis not present

## 2015-06-26 DIAGNOSIS — I5022 Chronic systolic (congestive) heart failure: Secondary | ICD-10-CM | POA: Diagnosis not present

## 2015-06-26 DIAGNOSIS — I4891 Unspecified atrial fibrillation: Secondary | ICD-10-CM | POA: Diagnosis not present

## 2015-06-26 DIAGNOSIS — Z87430 Personal history of prostatic dysplasia: Secondary | ICD-10-CM | POA: Insufficient documentation

## 2015-06-26 DIAGNOSIS — Z9889 Other specified postprocedural states: Secondary | ICD-10-CM | POA: Insufficient documentation

## 2015-06-26 DIAGNOSIS — Z950 Presence of cardiac pacemaker: Secondary | ICD-10-CM | POA: Insufficient documentation

## 2015-06-26 DIAGNOSIS — Z7901 Long term (current) use of anticoagulants: Secondary | ICD-10-CM | POA: Diagnosis not present

## 2015-06-26 LAB — BASIC METABOLIC PANEL
ANION GAP: 9 (ref 5–15)
BUN: 14 mg/dL (ref 6–20)
CHLORIDE: 95 mmol/L — AB (ref 101–111)
CO2: 28 mmol/L (ref 22–32)
CREATININE: 1.02 mg/dL (ref 0.61–1.24)
Calcium: 8.8 mg/dL — ABNORMAL LOW (ref 8.9–10.3)
GFR calc non Af Amer: >60 mL/min (ref >60)
Glucose, Bld: 87 mg/dL (ref 65–99)
POTASSIUM: 4.4 mmol/L (ref 3.5–5.1)
SODIUM: 132 mmol/L — AB (ref 135–145)

## 2015-06-26 LAB — CBC
HEMATOCRIT: 41.2 % (ref 39.0–52.0)
HEMOGLOBIN: 13.9 g/dL (ref 13.0–17.0)
MCH: 31 pg (ref 26.0–34.0)
MCHC: 33.7 g/dL (ref 30.0–36.0)
MCV: 92 fL (ref 78.0–100.0)
PLATELETS: 151 10*3/uL (ref 150–400)
RBC: 4.48 MIL/uL (ref 4.22–5.81)
RDW: 13.8 % (ref 11.5–15.5)
WBC: 6.1 10*3/uL (ref 4.0–10.5)

## 2015-06-26 LAB — I-STAT TROPONIN, ED
Troponin i, poc: 0.01 ng/mL (ref 0.00–0.08)
Troponin i, poc: 0 ng/mL (ref 0.00–0.08)

## 2015-06-26 LAB — PROTIME-INR
INR: 2.62 — AB (ref 0.00–1.49)
Prothrombin Time: 27.6 seconds — ABNORMAL HIGH (ref 11.6–15.2)

## 2015-06-26 MED ORDER — GI COCKTAIL ~~LOC~~
30.0000 mL | Freq: Once | ORAL | Status: AC
  Administered 2015-06-26: 30 mL via ORAL
  Filled 2015-06-26: qty 30

## 2015-06-26 MED ORDER — ALPRAZOLAM 0.25 MG PO TABS
1.0000 mg | ORAL_TABLET | Freq: Once | ORAL | Status: AC
  Administered 2015-06-26: 1 mg via ORAL
  Filled 2015-06-26: qty 4

## 2015-06-26 MED ORDER — NITROGLYCERIN 2 % TD OINT
1.0000 [in_us] | TOPICAL_OINTMENT | Freq: Once | TRANSDERMAL | Status: AC
  Administered 2015-06-26: 1 [in_us] via TOPICAL
  Filled 2015-06-26: qty 1

## 2015-06-26 NOTE — ED Provider Notes (Signed)
CSN: [id number]     Arrival date & time 06/26/15  [id number] History   First MD Initiated Contact with Patient 06/26/15 [phone]     Chief Complaint  Patient presents with  . Chest Pain  . Nausea     (Consider location/radiation/quality/duration/timing/severity/associated sxs/prior Treatment) HPI Comments: 75 year old male with an extensive past medical history including ischemic cardiomyopathy, CHF with an EF of 25% status post bipolar ICD, prior MIs for significant stenosis of multiple vessels status post CABG 3, A. fib/A flutter status post ablation and complete heart block with a pacemaker who recently had a heart cath revealing clean and bypass vessels performed in December. Of note patient also has a history of small hiatal hernia with stricture who is followed by GI and receives esophageal dilatations of which she had one in January. He presents today with substernal chest pain similar to his prior chest pains.   Patient is a 75 y.o. male presenting with chest pain. The history is provided by the patient.  Chest Pain Pain location:  Substernal area Pain quality: dull   Pain radiates to:  Does not radiate Pain severity:  Moderate Onset quality:  Gradual Duration:  3 hours Timing:  Constant Progression:  Unchanged Chronicity:  Recurrent Context: at rest   Relieved by:  Nothing Ineffective treatments:  Nitroglycerin Associated symptoms: no abdominal pain, no back pain, no cough, no dizziness, no fatigue, no fever, no headache, no nausea, no palpitations, no shortness of breath, not vomiting and no weakness   Risk factors: coronary artery disease, high cholesterol, hypertension and male sex   Risk factors: no diabetes mellitus, no prior DVT/PE and no smoking     Past Medical History  Diagnosis Date  . Ischemic cardiomyopathy     a. 06/2012 EF 25% by echo.  . Hypertension   . Hyperlipidemia   . GERD (gastroesophageal reflux disease)   . CAD (coronary artery disease)     a. 1991 s/p  CABGx4;  b. redo CABG in 2007 (SVG->OM, VG->PDA->PLV);  c. 01/2011 Cath: LM 80ost, LAD 100p, LCX 100ost, OM1 100, RCA 100, LIMA->LAD ok, VG->PDA ok, VG->OM ok;  d. 03/2012 MV: EF 32% inf & inflat scar w/o ischemia; e. Lexi MV: EF 33%, inf infarct, no ischemia. f. lexiscan 06/12/2014 RCA scar, no ischemia, EF 26%  . Atrial flutter ([place])     a. 01/2011 s/p unsuccessful RFCA complicated by CHB req PPM.  . CHB (complete heart block) ([place])     a. 01/2011: in setting of RFCA, s/p SJM 2110 [employer] DC PPM, ser # [id number].  [name] Kitchen Kidney cysts     a. bilateral  . Hiatal hernia     a. 03/01/2013 EGD and esoph dil.  [name] Kitchen BPH (benign prostatic hyperplasia)     a. elevated PSA  . A-fib ([facility])     a. chronic coumadin  . Elevated PSA, less than 10 ng/ml   . Fibromyalgia   . Skin cancer     'above left eyebrow; tip of my nose" (08/29/2012)  . Depression     "since 1986-1987" (08/29/2012)  . Anxiety   . Panic attacks   . Chronic systolic CHF (congestive heart failure), NYHA class 2 ([place])     a. 06/2012 Echo: EF 25%, distal sept and apical AK, sev dil LV, mild LVH, mod dil LA.  [name] Kitchen Pacemaker     explanted 06/15/15 >>> CRT-D STJ device, Dr. Caryl Comes   Past Surgical History  Procedure Laterality Date  . Cardiac catheterization  10/30/2009  Grafts patent. Mild to moderate LV dysfunction. EF 45%. Managed medically.   . Cardiac catheterization      multiple  . Tonsillectomy  1960  . Cholecystectomy  2007  . Insert / replace / remove pacemaker  01/2011    initial placement  . Inguinal hernia repair  1960's    "left I think"  . Cataract extraction w/ intraocular lens  implant, bilateral  2012  . Cardioversion  05/25/2012    Procedure: CARDIOVERSION;  Surgeon: Darlin Coco, MD;  Location: [facility] ENDOSCOPY;  Service: Cardiovascular;  Laterality: N/A;  . Humerus surgery Left 1960    "growth on the bone taken off; not cancer" (08/29/2012)  . Excisional hemorrhoidectomy  ~ 1980  . Skin cancer excision      'above left eyebrow;  tip of my nose" (08/29/2012)  . Atrial flutter ablation  01/2011    "didn't work" (08/29/2012)  . Coronary artery bypass graft  08/27/1989    CABG X 5  . Coronary artery bypass graft  06/2005    CABG X 3  . Cardiac catheterization N/A 04/28/2015    Procedure: Left Heart Cath and Cors/Grafts Angiography;  Surgeon: Belva Crome, MD;  Location: [place] [facility] LAB;  Service: Cardiovascular;  Laterality: N/A;  . Ep implantable device N/A 06/15/2015    STJ, CRT-D, Dr. Caryl Comes   Family History  Problem Relation Age of Onset  . Stroke Mother   . Heart attack Father     died @ 36, mult MI's.  [name] Kitchen Heart disease Father   . Hypertension Father   . Diabetes Sister    Social History  Substance Use Topics  . Smoking status: Former Smoker -- 0.00 packs/day for 0 years    Types: Cigarettes    Quit date: 09/21/1973  . Smokeless tobacco: Never Used  . Alcohol Use: No    Review of Systems  Constitutional: Negative for fever, chills, appetite change and fatigue.  HENT: Negative for congestion, ear pain, facial swelling, mouth sores and sore throat.   Eyes: Negative for visual disturbance.  Respiratory: Negative for cough, chest tightness and shortness of breath.   Cardiovascular: Positive for chest pain. Negative for palpitations.  Gastrointestinal: Negative for nausea, vomiting, abdominal pain, diarrhea and blood in stool.  Endocrine: Negative for cold intolerance and heat intolerance.  Genitourinary: Negative for frequency, decreased urine volume and difficulty urinating.  Musculoskeletal: Negative for back pain and neck stiffness.  Skin: Negative for rash.  Neurological: Negative for dizziness, weakness, light-headedness and headaches.      Allergies  Prednisone; Sulfa drugs cross reactors; Ultram; and Xarelto  Home Medications   Prior to Admission medications   Medication Sig Start Date End Date Taking? Authorizing Provider  acetaminophen (TYLENOL) 500 MG tablet Take 500 mg by mouth every  8 (eight) hours as needed for mild pain.    Yes Historical Provider, MD  ALPRAZolam Duanne Moron) 1 MG tablet Take 1 tablet (1 mg total) by mouth 3 (three) times daily as needed. For anxiety 03/06/15  Yes Darlin Coco, MD  carvedilol (COREG) 6.25 MG tablet Take 1 tablet by mouth 2 (two) times daily. 10/15/14  Yes Historical Provider, MD  doxepin (SINEQUAN) 25 MG capsule Take 25-50 mg by mouth 2 (two) times daily. Take 25 mg (1 capsule) by mouth in the morning and 50 mg (2 capsules) by mouth at night.   Yes Historical Provider, MD  furosemide (LASIX) 20 MG tablet Take 1 tablet (20 mg total) by mouth daily as needed  for fluid. One tablet by mouth daily as needed for swelling 06/16/15  Yes Renee Dyane Dustman, PA-C  gabapentin (NEURONTIN) 100 MG capsule Take 100 mg by mouth 4 (four) times daily.   Yes Historical Provider, MD  hydroxypropyl methylcellulose / hypromellose (ISOPTO TEARS / GONIOVISC) 2.5 % ophthalmic solution Place 1 drop into both eyes 3 (three) times daily as needed for dry eyes.   Yes Historical Provider, MD  isosorbide mononitrate (IMDUR) 60 MG 24 hr tablet Take 60 mg by mouth daily.   Yes Historical Provider, MD  lisinopril (PRINIVIL,ZESTRIL) 5 MG tablet Take 1 tablet (5 mg total) by mouth every other day. 1 tablet by mouth every other day or as directed 06/16/15  Yes Renee Dyane Dustman, PA-C  loratadine (CLARITIN) 10 MG tablet Take 10 mg by mouth daily.   Yes Historical Provider, MD  Multiple Vitamin (MULTIVITAMIN) tablet Take 1 tablet by mouth daily.     Yes Historical Provider, MD  nitroGLYCERIN (NITROSTAT) 0.4 MG SL tablet DISSOLVE ONE TABLET UNDER THE TONGUE EVERY 5 MINUTES AS NEEDED FOR CHEST PAIN.  DO NOT EXCEED A TOTAL OF 3 DOSES IN 15 MINUTES 05/11/15  Yes Darlin Coco, MD  nystatin ointment (MYCOSTATIN) Apply 1 application topically 2 (two) times daily. 05/14/15  Yes Eustaquio Maize, MD  omeprazole (PRILOSEC) 20 MG capsule Take 1 capsule (20 mg total) by mouth 2 (two) times daily.  03/25/13  Yes Darlin Coco, MD  polyethylene glycol (MIRALAX / GLYCOLAX) packet Take 17 g by mouth daily.    Yes Historical Provider, MD  rosuvastatin (CRESTOR) 10 MG tablet Take 1 tablet (10 mg total) by mouth daily. 03/25/15  Yes Darlin Coco, MD  warfarin (JANTOVEN) 5 MG tablet Take 0.5-1 tablets (2.5-5 mg total) by mouth daily. Take 1 tablet all days except Saturday take 0.5 tablet 06/23/15  Yes Chipper Herb, MD  ciprofloxacin (CILOXAN) 0.3 % ophthalmic solution Reported on 06/26/2015 05/06/15   Historical Provider, MD  fluticasone (FLONASE) 50 MCG/ACT nasal spray Place 2 sprays into both nostrils daily. Patient not taking: Reported on 06/26/2015 04/30/15   Sharion Balloon, FNP  Multiple Vitamins-Minerals (PRESERVISION AREDS 2) CAPS Take 2 capsules by mouth daily. Reported on 06/26/2015    Historical Provider, MD   BP 111/73 mmHg  Pulse 74  Temp(Src) 97.5 F (36.4 C) (Oral)  Resp 20  Ht 5\' 11"  (1.803 m)  Wt 83.099 kg  BMI 25.56 kg/m2  SpO2 97% Physical Exam  Constitutional: He is oriented to person, place, and time. He appears well-nourished. No distress.  HENT:  Head: Normocephalic and atraumatic.  Right Ear: External ear normal.  Left Ear: External ear normal.  Eyes: Pupils are equal, round, and reactive to light. Right eye exhibits no discharge. Left eye exhibits no discharge. No scleral icterus.  Neck: Normal range of motion. Neck supple.  Cardiovascular: Normal rate.  Exam reveals no gallop and no friction rub.   No murmur heard. Pulmonary/Chest: Effort normal and breath sounds normal. No stridor. No respiratory distress. He has no wheezes. He has no rales. He exhibits no tenderness.  Abdominal: Soft. He exhibits no distension and no mass. There is no tenderness. There is no rebound and no guarding.  Musculoskeletal: He exhibits no edema or tenderness.  Neurological: He is alert and oriented to person, place, and time.  Skin: Skin is warm and dry. No rash noted. He is not  diaphoretic. No erythema.    ED Course  Procedures (including critical care time) Labs  Review Labs Reviewed  BASIC METABOLIC PANEL - Abnormal; Notable for the following:    Sodium 132 (*)    Chloride 95 (*)    Calcium 8.8 (*)    All other components within normal limits  PROTIME-INR - Abnormal; Notable for the following:    Prothrombin Time 27.6 (*)    INR 2.62 (*)    All other components within normal limits  CBC  I-STAT TROPOININ, ED  Randolm Idol, ED    Imaging Review Dg Chest 2 View  06/25/2015  CLINICAL DATA:  Hypertension.  History of CHF. EXAM: CHEST  2 VIEW COMPARISON:  06/16/2015. FINDINGS: AICD in stable position. Prior CABG. Stable cardiomegaly. Low lung volumes with basilar pleural parenchymal scarring. No pleural effusion or pneumothorax. Effusion IMPRESSION: 1. AICD in stable position. Prior CABG. Stable cardiomegaly. No CHF. 2. Basilar pleural-parenchymal scarring. Electronically Signed   By: Marcello Moores  Register   On: 06/25/2015 14:21   I have personally reviewed and evaluated these images and lab results as part of my medical decision-making.   EKG Interpretation   Date/Time:  Friday June 26 2015 07:39:25 EST Ventricular Rate:  70 PR Interval:    QRS Duration: 222 QT Interval:  476 QTC Calculation: 514 R Axis:   -79 Text Interpretation:  Ventricular-paced rhythm Abnormal ECG since last  tracing no significant change Confirmed by MILLER  MD, BRIAN ([id number]) on  06/26/2015 9:22:20 AM      MDM    75 year old male with a extensive past medical history listed above presents with chest pain. History as above  On arrival patients afebrile with stable vital signs. EKG revealing a paced rhythm. Chest x-ray was performed yesterday her cardiology after a follow-up visit after replacement of his ICD. Chest x-ray was unremarkable. Labs this morning were nonspecific. Initial troponin negative. Second troponin negative.  Low concern for pulmonary embolism or  dissection at this time.  Consultation cardiology for evaluation in the emergency department. After the evaluation they did not feel that this was ACS related. They did touch base with Dr. Caryl Comes who had recently replaced ICD who agreed that patient would be appropriate for discharge and close follow-up in clinic next week.   Patient remained stable during his ED visits and extremely pleasant. During his stay the patient was given a meals , which he consumed without complication.   We also feel that the patient is safe for discharge with close follow-up cardiology. Next  Patient was given strict return precautions.  He was seen in conjunction with Dr. Sabra Heck.  Diagnostic studies interpreted by me and use to my clinical decision-making.  Final diagnoses:  Other chest pain        Addison Lank, MD 06/26/15 [place], MD 06/26/15 [phone]

## 2015-06-26 NOTE — ED Notes (Signed)
Placed order for pt. Breakfast tray

## 2015-06-26 NOTE — H&P (Signed)
History and Physical   Patient ID: [name] MRN: [id number], DOB/AGE: Oct 29, 1940 75 y.o. Date of Encounter: 06/26/2015  Primary Physician: Eustaquio Maize, MD Primary Cardiologist: Dr Caryl Comes  Chief Complaint:  Chest pain  HPI: [name] is a 75 y.o. male with a history of mixed ICM/NICM, EF 40-45%, atrial flutter w/ ablation>>CHB w/ St J CRT-D insertion. S/p CABG x 2, HTN, HLD, HH, GERD, Afib on coumadin.  02/13, pt had single chamber defib (St. Jude Quadra ICD serial [id number]) w/ LV lead implanted.  02/23 wound check, LV threshold increased from 1.25@0 .5 to 3.25@0 .5- pulse width increased from 0.5 to 0.1ms (vector programming left alone- multi vector testing performed and results printed). CXR results below, no obvious problem with the leads. Pt noted increased DOE and nausea walking to radiology yesterday.   Today, pt woke with chest pain, dull, 7/10, no radiation. No SOB or diaphoresis. +Nausea, no vomiting. Pt took ASA 81 mg x 4 and SL NTG x 3. Nitro helped the pain, but did not relieve it. He came to the ER and got a GI cocktail which helped the pain. Pt also got NTG paste 1". Currently pain-free.   Pt weight has been doing OK recently, weight about 183 lbs. No LE edema, no orthopnea or PND. Coumadin followed by primary MD. Pt has inguinal hernia and ambulation has been limited due to this.    Past Medical History  Diagnosis Date  . Ischemic cardiomyopathy     a. 06/2012 EF 25% by echo.  . Hypertension   . Hyperlipidemia   . GERD (gastroesophageal reflux disease)   . CAD (coronary artery disease)     a. 1991 s/p CABGx4;  b. redo CABG in 2007 (SVG->OM, VG->PDA->PLV);  c. 01/2011 Cath: LM 80ost, LAD 100p, LCX 100ost, OM1 100, RCA 100, LIMA->LAD ok, VG->PDA ok, VG->OM ok;  d. 03/2012 MV: EF 32% inf & inflat scar w/o ischemia; e. Lexi MV: EF 33%, inf infarct, no ischemia. f. lexiscan 06/12/2014 RCA scar, no ischemia, EF 26%  . Atrial flutter ([place])     a. 01/2011 s/p  unsuccessful RFCA complicated by CHB req PPM.  . CHB (complete heart block) ([place])     a. 01/2011: in setting of RFCA, s/p SJM 2110 Accent DC PPM, ser # [id number].  [name] Kitchen Kidney cysts     a. bilateral  . Hiatal hernia     a. 03/01/2013 EGD and esoph dil.  [name] Kitchen BPH (benign prostatic hyperplasia)     a. elevated PSA  . A-fib ([facility])     a. chronic coumadin  . Elevated PSA, less than 10 ng/ml   . Fibromyalgia   . Skin cancer     'above left eyebrow; tip of my nose" (08/29/2012)  . Depression     "since 1986-1987" (08/29/2012)  . Anxiety   . Panic attacks   . Chronic systolic CHF (congestive heart failure), NYHA class 2 ([place])     a. 06/2012 Echo: EF 25%, distal sept and apical AK, sev dil LV, mild LVH, mod dil LA.  [name] Kitchen Pacemaker     explanted 06/15/15 >>> CRT-D STJ device, Dr. Caryl Comes    Surgical History:  Past Surgical History  Procedure Laterality Date  . Cardiac catheterization  10/30/2009    Grafts patent. Mild to moderate LV dysfunction. EF 45%. Managed medically.   . Cardiac catheterization      multiple  . Tonsillectomy  1960  . Cholecystectomy  2007  . Insert /  replace / remove pacemaker  01/2011    initial placement  . Inguinal hernia repair  1960's    "left I think"  . Cataract extraction w/ intraocular lens  implant, bilateral  2012  . Cardioversion  05/25/2012    Procedure: CARDIOVERSION;  Surgeon: Darlin Coco, MD;  Location: [facility] ENDOSCOPY;  Service: Cardiovascular;  Laterality: N/A;  . Humerus surgery Left 1960    "growth on the bone taken off; not cancer" (08/29/2012)  . Excisional hemorrhoidectomy  ~ 1980  . Skin cancer excision      'above left eyebrow; tip of my nose" (08/29/2012)  . Atrial flutter ablation  01/2011    "didn't work" (08/29/2012)  . Coronary artery bypass graft  08/27/1989    CABG X 5  . Coronary artery bypass graft  06/2005    CABG X 3  . Cardiac catheterization N/A 04/28/2015    Procedure: Left Heart Cath and Cors/Grafts Angiography;  Surgeon: Belva Crome,  MD;  Location: [place] CV LAB;  Service: Cardiovascular;  Laterality: N/A;  . Ep implantable device N/A 06/15/2015    STJ, CRT-D, Dr. Caryl Comes     I have reviewed the patient's current medications. Medication Sig  acetaminophen (TYLENOL) 500 MG tablet Take 500 mg by mouth every 8 (eight) hours as needed for mild pain.   ALPRAZolam (XANAX) 1 MG tablet Take 1 tablet (1 mg total) by mouth 3 (three) times daily as needed. For anxiety  carvedilol (COREG) 6.25 MG tablet Take 1 tablet by mouth 2 (two) times daily.  doxepin (SINEQUAN) 25 MG capsule Take 25-50 mg by mouth 2 (two) times daily. Take 25 mg (1 capsule) by mouth in the morning and 50 mg (2 capsules) by mouth at night.  furosemide (LASIX) 20 MG tablet Take 1 tablet (20 mg total) by mouth daily as needed for fluid. One tablet by mouth daily as needed for swelling  gabapentin (NEURONTIN) 100 MG capsule Take 100 mg by mouth 4 (four) times daily.  hydroxypropyl methylcellulose / hypromellose (ISOPTO TEARS / GONIOVISC) 2.5 % ophthalmic solution Place 1 drop into both eyes 3 (three) times daily as needed for dry eyes.  isosorbide mononitrate (IMDUR) 60 MG 24 hr tablet Take 60 mg by mouth daily.  lisinopril (PRINIVIL,ZESTRIL) 5 MG tablet Take 1 tablet (5 mg total) by mouth every other day. 1 tablet by mouth every other day or as directed  loratadine (CLARITIN) 10 MG tablet Take 10 mg by mouth daily.  Multiple Vitamin (MULTIVITAMIN) tablet Take 1 tablet by mouth daily.    nitroGLYCERIN (NITROSTAT) 0.4 MG SL tablet DISSOLVE ONE TABLET UNDER THE TONGUE EVERY 5 MINUTES AS NEEDED FOR CHEST PAIN.  DO NOT EXCEED A TOTAL OF 3 DOSES IN 15 MINUTES  nystatin ointment (MYCOSTATIN) Apply 1 application topically 2 (two) times daily.  omeprazole (PRILOSEC) 20 MG capsule Take 1 capsule (20 mg total) by mouth 2 (two) times daily.  polyethylene glycol (MIRALAX / GLYCOLAX) packet Take 17 g by mouth daily.   rosuvastatin (CRESTOR) 10 MG tablet Take 1 tablet (10 mg  total) by mouth daily.  warfarin (JANTOVEN) 5 MG tablet Take 0.5-1 tablets (2.5-5 mg total) by mouth daily. Take 1 tablet all days except Saturday take 0.5 tablet  ciprofloxacin (CILOXAN) 0.3 % ophthalmic solution Reported on 06/26/2015  fluticasone (FLONASE) 50 MCG/ACT nasal spray Place 2 sprays into both nostrils daily. Patient not taking: Reported on 06/26/2015  Multiple Vitamins-Minerals (PRESERVISION AREDS 2) CAPS Take 2 capsules by mouth daily. Reported on  06/26/2015   Scheduled Meds: Continuous Infusions: PRN Meds:.  Allergies:  Allergies  Allergen Reactions  . Prednisone Itching  . Sulfa Drugs Cross Reactors Other (See Comments)    unknown  . Ultram [Tramadol Hcl] Itching    "don't remember how bad"  . Xarelto [Rivaroxaban] Other (See Comments)    Itching     Social History   Social History  . Marital Status: Widowed    Spouse Name: N/A  . Number of Children: 2  . Years of Education: 12   Occupational History  . Retired    Social History Main Topics  . Smoking status: Former Smoker -- 0.00 packs/day for 0 years    Types: Cigarettes    Quit date: 09/21/1973  . Smokeless tobacco: Never Used  . Alcohol Use: No  . Drug Use: No  . Sexual Activity: No   Other Topics Concern  . Not on file   Social History Narrative   Wife passed away in Aug 09, 2011. Lives in [place] with his daughter and her family.  Retired from [employer].    Family History  Problem Relation Age of Onset  . Stroke Mother   . Heart attack Father     died @ 72, mult MI's.  [name] Kitchen Heart disease Father   . Hypertension Father   . Diabetes Sister    Family Status  Relation Status Death Age  . Mother Deceased   . Father Deceased   . Maternal Grandmother Deceased   . Maternal Grandfather Deceased   . Paternal Grandmother Deceased   . Paternal Grandfather Deceased     Review of Systems:   Full 14-point review of systems otherwise negative except as noted above.  Physical Exam: Blood  pressure 112/75, pulse 70, temperature 97.5 F (36.4 C), temperature source Oral, resp. rate 13, height 5\' 11"  (1.803 m), weight 183 lb 3.2 oz (83.099 kg), SpO2 100 %. General: Well developed, well nourished,male in no acute distress. Head: Normocephalic, atraumatic, sclera non-icteric, no xanthomas, nares are without discharge. Dentition: poor Neck: No carotid bruits. JVD not elevated. No thyromegally Lungs: Good expansion bilaterally. without wheezes or rhonchi.  Heart: IRRegular rate and rhythm with S1 S2.  No S3 or S4.  No murmur, no rubs, or gallops appreciated. Abdomen: Soft, non-tender, non-distended with normoactive bowel sounds. No hepatomegaly. No rebound/guarding. No obvious abdominal masses. Msk:  Strength and tone appear normal for age. No joint deformities or effusions, no spine or costo-vertebral angle tenderness. Extremities: No clubbing or cyanosis. No edema.  Distal pedal pulses are 2+ in 4 extrem Neuro: Alert and oriented X 3. Moves all extremities spontaneously. No focal deficits noted. Psych:  Responds to questions appropriately with a normal affect. Skin: No rashes or lesions noted  Labs:   Lab Results  Component Value Date   WBC 6.1 06/26/2015   HGB 13.9 06/26/2015   HCT 41.2 06/26/2015   MCV 92.0 06/26/2015   PLT 151 06/26/2015    Lab Results  Component Value Date   INR 1.8 06/23/2015   INR 2.03* 06/15/2015   INR 2.47* 06/09/2015     Recent Labs Lab 06/26/15 0757  NA 132*  K 4.4  CL 95*  CO2 28  BUN 14  CREATININE 1.02  CALCIUM 8.8*  GLUCOSE 87    Recent Labs  06/26/15 0757 06/26/15 1136  TROPIPOC 0.01 0.00   Lab Results  Component Value Date   CHOL 131 03/25/2015   HDL 42 03/25/2015   LDLCALC 73 03/25/2015  TRIG 82 03/25/2015   Radiology/Studies: Dg Chest 2 View 06/25/2015  CLINICAL DATA:  Hypertension.  History of CHF. EXAM: CHEST  2 VIEW COMPARISON:  06/16/2015. FINDINGS: AICD in stable position. Prior CABG. Stable cardiomegaly. Low  lung volumes with basilar pleural parenchymal scarring. No pleural effusion or pneumothorax. Effusion IMPRESSION: 1. AICD in stable position. Prior CABG. Stable cardiomegaly. No CHF. 2. Basilar pleural-parenchymal scarring. Electronically Signed   By: Marcello Moores  Register   On: 06/25/2015 14:21     Cardiac Cath: 04/28/2015 1. Ramus lesion, 100% stenosed. 2. Prox RCA lesion, 100% stenosed. 3. Ost Cx to Prox Cx lesion, 100% stenosed. 4. 1st Diag lesion, 100% stenosed. 5. Mid LAD to Dist LAD lesion, 100% stenosed. 6. SVG is normal in caliber, and is anatomically normal. 7. Mid RCA to Dist RCA lesion, 100% stenosed. 8. was injected is normal in caliber, and is anatomically normal. 9. LIMA was injected is normal in caliber, and is anatomically normal.  Severe native vessel coronary disease with total occlusion of the proximal RCA, proximal circumflex, and proximal LAD.  Widely patent sequential saphenous vein graft to the distal RCA and continuation of the right coronary, widely patent saphenous vein graft to the ramus intermedius, and widely patent LIMA to LAD.  The native ramus intermedius supplies collaterals to an acute marginal branch of the right coronary. The native distal right coronary supplies collaterals to the circumflex/marginal system.  Left ventriculography was not performed. Pressures were recorded. LVEF was assessed by echocardiography on this admission.  Overall, no change in coronary anatomy since the prior coronary angiogram.  Echo: 04/27/2015 - Left ventricle: The cavity size was normal. Wall thickness was increased in a pattern of mild LVH. Systolic function was moderately to severely reduced. The estimated ejection fraction was in the range of 30% to 35%. There is akinesis of the inferoseptal myocardium. Doppler parameters are consistent with a reversible restrictive pattern, indicative of decreased left ventricular diastolic compliance and/or increased left  atrial pressure (grade 3 diastolic dysfunction). - Aortic valve: Trileaflet; mildly thickened, mildly calcified leaflets. - Mitral valve: Moderately calcified annulus. There was mild regurgitation. - Left atrium: The atrium was moderately dilated. - Right ventricle: Pacer wire or catheter noted in right ventricle. - Right atrium: The atrium was moderately dilated. Impressions: - Compared to the prior study, there has been no significant interval change.  ECG: 06/26/2015 Atrial fib, V pacing  ASSESSMENT AND PLAN:  Principal Problem: 1.  Chest pain with moderate risk for cardiac etiology - pt has known CAD, but no recent chest pain w/ exertion - However, exertion level is limited due to R groin hernia - symptoms partly relieved by SL NTG, resolved w/ GI cocktail - started at rest.  - cath 04/2015 w/ med rx recommended - MD advise on checking another troponin and possible d/c if  Trop negative  - continue ASA, Coreg, Imdur, statin  Active Problems: 2.  Hypertension - good control on current rx    Chronic systolic CHF (congestive heart failure), NYHA class 2 (HCC) - weight is at baseline, continue Lasix PRN    Chronic anticoagulation - INR was 1.8, 3 days ago - recheck   Jonetta Speak, PA-C 06/26/2015 1:17 PM Beeper [phone]

## 2015-06-26 NOTE — ED Notes (Signed)
Pt reports having to walk long distance yesterday and then onset of nausea. Went home and onset of mid chest discomfort this morning. Reports mild sob, denies swelling to extremities.

## 2015-06-26 NOTE — Discharge Instructions (Signed)
Nonspecific Chest Pain  °Chest pain can be caused by many different conditions. There is always a chance that your pain could be related to something serious, such as a heart attack or a blood clot in your lungs. Chest pain can also be caused by conditions that are not life-threatening. If you have chest pain, it is very important to follow up with your health care provider. °CAUSES  °Chest pain can be caused by: °· Heartburn. °· Pneumonia or bronchitis. °· Anxiety or stress. °· Inflammation around your heart (pericarditis) or lung (pleuritis or pleurisy). °· A blood clot in your lung. °· A collapsed lung (pneumothorax). It can develop suddenly on its own (spontaneous pneumothorax) or from trauma to the chest. °· Shingles infection (varicella-zoster virus). °· Heart attack. °· Damage to the bones, muscles, and cartilage that make up your chest wall. This can include: °¨ Bruised bones due to injury. °¨ Strained muscles or cartilage due to frequent or repeated coughing or overwork. °¨ Fracture to one or more ribs. °¨ Sore cartilage due to inflammation (costochondritis). °RISK FACTORS  °Risk factors for chest pain may include: °· Activities that increase your risk for trauma or injury to your chest. °· Respiratory infections or conditions that cause frequent coughing. °· Medical conditions or overeating that can cause heartburn. °· Heart disease or family history of heart disease. °· Conditions or health behaviors that increase your risk of developing a blood clot. °· Having had chicken pox (varicella zoster). °SIGNS AND SYMPTOMS °Chest pain can feel like: °· Burning or tingling on the surface of your chest or deep in your chest. °· Crushing, pressure, aching, or squeezing pain. °· Dull or sharp pain that is worse when you move, cough, or take a deep breath. °· Pain that is also felt in your back, neck, shoulder, or arm, or pain that spreads to any of these areas. °Your chest pain may come and go, or it may stay  constant. °DIAGNOSIS °Lab tests or other studies may be needed to find the cause of your pain. Your health care provider may have you take a test called an ambulatory ECG (electrocardiogram). An ECG records your heartbeat patterns at the time the test is performed. You may also have other tests, such as: °· Transthoracic echocardiogram (TTE). During echocardiography, sound waves are used to create a picture of all of the heart structures and to look at how blood flows through your heart. °· Transesophageal echocardiogram (TEE). This is a more advanced imaging test that obtains images from inside your body. It allows your health care provider to see your heart in finer detail. °· Cardiac monitoring. This allows your health care provider to monitor your heart rate and rhythm in real time. °· Holter monitor. This is a portable device that records your heartbeat and can help to diagnose abnormal heartbeats. It allows your health care provider to track your heart activity for several days, if needed. °· Stress tests. These can be done through exercise or by taking medicine that makes your heart beat more quickly. °· Blood tests. °· Imaging tests. °TREATMENT  °Your treatment depends on what is causing your chest pain. Treatment may include: °· Medicines. These may include: °¨ Acid blockers for heartburn. °¨ Anti-inflammatory medicine. °¨ Pain medicine for inflammatory conditions. °¨ Antibiotic medicine, if an infection is present. °¨ Medicines to dissolve blood clots. °¨ Medicines to treat coronary artery disease. °· Supportive care for conditions that do not require medicines. This may include: °¨ Resting. °¨ Applying heat   or cold packs to injured areas. °¨ Limiting activities until pain decreases. °HOME CARE INSTRUCTIONS °· If you were prescribed an antibiotic medicine, finish it all even if you start to feel better. °· Avoid any activities that bring on chest pain. °· Do not use any tobacco products, including  cigarettes, chewing tobacco, or electronic cigarettes. If you need help quitting, ask your health care provider. °· Do not drink alcohol. °· Take medicines only as directed by your health care provider. °· Keep all follow-up visits as directed by your health care provider. This is important. This includes any further testing if your chest pain does not go away. °· If heartburn is the cause for your chest pain, you may be told to keep your head raised (elevated) while sleeping. This reduces the chance that acid will go from your stomach into your esophagus. °· Make lifestyle changes as directed by your health care provider. These may include: °¨ Getting regular exercise. Ask your health care provider to suggest some activities that are safe for you. °¨ Eating a heart-healthy diet. A registered dietitian can help you to learn healthy eating options. °¨ Maintaining a healthy weight. °¨ Managing diabetes, if necessary. °¨ Reducing stress. °SEEK MEDICAL CARE IF: °· Your chest pain does not go away after treatment. °· You have a rash with blisters on your chest. °· You have a fever. °SEEK IMMEDIATE MEDICAL CARE IF:  °· Your chest pain is worse. °· You have an increasing cough, or you cough up blood. °· You have severe abdominal pain. °· You have severe weakness. °· You faint. °· You have chills. °· You have sudden, unexplained chest discomfort. °· You have sudden, unexplained discomfort in your arms, back, neck, or jaw. °· You have shortness of breath at any time. °· You suddenly start to sweat, or your skin gets clammy. °· You feel nauseous or you vomit. °· You suddenly feel light-headed or dizzy. °· Your heart begins to beat quickly, or it feels like it is skipping beats. °These symptoms may represent a serious problem that is an emergency. Do not wait to see if the symptoms will go away. Get medical help right away. Call your local emergency services (911 in the [place] Do not drive yourself to the hospital. °  °This  information is not intended to replace advice given to you by your health care provider. Make sure you discuss any questions you have with your health care provider. °  °Document Released: 01/26/2005 Document Revised: 05/09/2014 Document Reviewed: 11/22/2013 °Elsevier Interactive Patient Education ©2016 [employer] ° °

## 2015-06-26 NOTE — ED Provider Notes (Signed)
The patient is a 75 year old male, he has known multivessel coronary disease status post bypass, status post pacemaker placement, recently had replacement, had chest x-ray yesterday, had walked down a long hallway which made him feel nauseated, this nausea was intermittent over night but this morning he woke up having central chest pain. There is no radiation, no shortness of breath, he still nauseated. EKG shows a paced rhythm, labs have been ordered, chest x-ray was done yesterday, I do not think it is to be repeated today. Cardiology consultation will be requested due to the patient's significant coronary obstructive disease in the past and now ongoing symptoms. Workup locates this is that he had a heart catheterization within the last 3 months which essentially showed patent grafts  Cardiology has seen - Dr. Debara Pickett recommends d/c  I saw and evaluated the patient, reviewed the resident's note and I agree with the findings and plan.   EKG Interpretation  Date/Time:  Friday June 26 2015 07:39:25 EST Ventricular Rate:  70 PR Interval:    QRS Duration: 222 QT Interval:  476 QTC Calculation: 514 R Axis:   -79 Text Interpretation:  Ventricular-paced rhythm Abnormal ECG since last tracing no significant change Confirmed by Tong Pieczynski  MD, Bobbie Virden ([id number]) on 06/26/2015 9:22:20 AM       I personally interpreted the EKG as well as the resident and agree with the interpretation on the resident's chart.  Final diagnoses:  None      Noemi Chapel, MD 06/26/15 [phone]

## 2015-07-01 ENCOUNTER — Telehealth: Payer: Self-pay | Admitting: Pediatrics

## 2015-07-01 MED ORDER — WARFARIN SODIUM 5 MG PO TABS: 2.5000 mg | ORAL_TABLET | Freq: Every day | ORAL | Status: DC

## 2015-07-01 NOTE — Telephone Encounter (Signed)
done

## 2015-07-02 ENCOUNTER — Telehealth: Payer: Self-pay | Admitting: Internal Medicine

## 2015-07-02 NOTE — Telephone Encounter (Signed)
Patient called stating that he got "hung up" on his covers this morning and he is concerned that he "pulled a lead out".  Patient states he is feeling well, but that he is nervous about his device function.  He want to know if he would feel different if the lead moved.  Advised patient that he will not necessarily feel different if his lead moves, but that I am happy to review a manual transmission from his [employer] monitor if he is concerned about device function.  Patient is agreeable to this plan.  He states that his daughter will be home this afternoon and can help him send a manual transmission with his new cellular adapter.  I requested that he call when they have sent the transmission so that I can ensure it has been received.  Patient verbalizes understanding of instructions.  He again denies any symptoms.  Patient denies any additional questions or concerns at this time.

## 2015-07-02 NOTE — Telephone Encounter (Signed)
New message     Pt is worried that he may have "pulled a lead out".  He had a defibulator put in approx 2 weeks ago.  Please call

## 2015-07-02 NOTE — Telephone Encounter (Signed)
Called patient to make him aware that we have not received a manual transmission.  Patient states that his daughter called tech services for assistance with setting up monitor and states that the customer service rep told him the device would automatically look for any alerts overnight.  Advised patient that this is correct.  Patient requests that I call him tomorrow to let him know if his monitor is working and if there are any alerts.  Advised patient I will call him tomorrow.  He denies any questions or concerns at this time and is appreciative of call.

## 2015-07-03 NOTE — Telephone Encounter (Signed)
Pt wants to know if you received his transmission from last night?

## 2015-07-03 NOTE — Telephone Encounter (Signed)
Spoke with patient's daughter as patient was at the grocery store.  Advised that monitor successfully updated yesterday per [employer] and that it did not transmit any alerts.  Patient's daughter verbalizes understanding and states that she will relay the message.  Advised to call with any additional questions or concerns.  Patient's daughter denies any questions at this time.

## 2015-07-06 ENCOUNTER — Telehealth: Payer: Self-pay | Admitting: Pediatrics

## 2015-07-06 NOTE — Telephone Encounter (Signed)
Stp and he states he ran into the door jam of the bathroom yesterday and not hard enough to leave a mark.He now has a headache but no visual disturbance, isn't dizzy. Pt states he has a lot of pressure behind his eyes and is having sinus drainage. Advised pt the headache is most likely due to the sinus problem. Advised pt to monitor and to CB if any of the above mentioned symptoms occur. Pt voiced understanding.

## 2015-07-07 ENCOUNTER — Ambulatory Visit (INDEPENDENT_AMBULATORY_CARE_PROVIDER_SITE_OTHER): Payer: Medicare Other | Admitting: Pharmacist Clinician (PhC)/ Clinical Pharmacy Specialist

## 2015-07-07 DIAGNOSIS — I482 Chronic atrial fibrillation, unspecified: Secondary | ICD-10-CM

## 2015-07-07 LAB — COAGUCHEK XS/INR WAIVED
INR: 2.5 — AB (ref 0.9–1.1)
PROTHROMBIN TIME: 30.3 s

## 2015-07-07 NOTE — Patient Instructions (Signed)
Anticoagulation Dose Instructions as of 07/07/2015      [name] Tue Wed Thu Fri Sat   New Dose 5 mg 5 mg 5 mg 5 mg 5 mg 5 mg 2.5 mg    Description        INR 2.5 Continue taking 1 tablet daily

## 2015-07-15 ENCOUNTER — Telehealth: Payer: Self-pay | Admitting: Internal Medicine

## 2015-07-15 NOTE — Telephone Encounter (Signed)
Will forward this message to device clinic to follow-up with this pt.

## 2015-07-15 NOTE — Telephone Encounter (Signed)
Returned patient's call.  Advised that he should raise his arm above his head to prevent frozen shoulder.  Advised that he can increase his weight lifting with his affected arm by 5lbs each week at this point.  Patient verbalizes understanding and appreciation.  He is aware to call with additional questions and denies any concerns at this time.

## 2015-07-15 NOTE — Telephone Encounter (Signed)
Mr. Kelderman is calling about raising his arm above his head . Just had a defibrillator placed on 06/15/15.

## 2015-07-19 ENCOUNTER — Other Ambulatory Visit: Payer: Self-pay | Admitting: Pediatrics

## 2015-07-28 ENCOUNTER — Encounter: Payer: Self-pay | Admitting: Pharmacist Clinician (PhC)/ Clinical Pharmacy Specialist

## 2015-07-28 ENCOUNTER — Telehealth: Payer: Self-pay | Admitting: Physician Assistant

## 2015-07-28 MED ORDER — CEPHALEXIN 500 MG PO CAPS: 500.0000 mg | ORAL_CAPSULE | Freq: Three times a day (TID) | ORAL | Status: DC

## 2015-07-28 NOTE — Telephone Encounter (Signed)
Paged by answering service. The patient had a BiV upgrade 06/15/15. Last night after getting out of shower she noted some oozing blood at incision site. Intermittent he noted some bleeding spot on t-shirt today as well. About quarter size redness with swelling. The patient denies nausea, vomiting, fever, chest pain, palpitations, shortness of breath, orthopnea, PND, dizziness, syncope, cough, congestion, abdominal pain, hematochezia, melena, lower extremity edema. Spoken with [facility]. Will place on Keflex 568mb TID. The patient will start abx tonight. If worsening of bleeding, pain, or fever/chills advised to got to ER for further evaluation tonight. Otherwise go to clinic in @ 9am tomorrow for evaluation. The patient voice understanding and agrees with plan.   [name], [place]

## 2015-07-29 ENCOUNTER — Ambulatory Visit (INDEPENDENT_AMBULATORY_CARE_PROVIDER_SITE_OTHER): Payer: Medicare Other | Admitting: *Deleted

## 2015-07-29 DIAGNOSIS — I255 Ischemic cardiomyopathy: Secondary | ICD-10-CM

## 2015-07-29 DIAGNOSIS — I481 Persistent atrial fibrillation: Secondary | ICD-10-CM

## 2015-07-29 DIAGNOSIS — I4819 Other persistent atrial fibrillation: Secondary | ICD-10-CM

## 2015-07-29 DIAGNOSIS — I5022 Chronic systolic (congestive) heart failure: Secondary | ICD-10-CM

## 2015-07-29 NOTE — Progress Notes (Signed)
Patient presents to the office w/ c/o bleeding from R corner of incision site yesterday. Incision site evaluated and stitch abcess noted. Dr.Taylor recommended that stitch be removed and that patient wash BID with antibacterial soap.   Stitch was removed. Area dressed with antibiotic ointment and bandaid. Patient to continue abx until prescription is completed.   Patient to follow up with the [facility] on 4/4 @ 1400 for another wound recheck when Dr.Klein is in the office.  Patient voiced understanding of all recommendations.

## 2015-07-30 ENCOUNTER — Telehealth: Payer: Self-pay | Admitting: *Deleted

## 2015-07-30 NOTE — Telephone Encounter (Signed)
Received direct call transferred from refill team and spoke with pt. He reports he started Keflex on 07/28/15. After taking fourth dose he noticed his urine was dark yellow. Also complaining of burning in penis area. This is all the time. Occurs with and without urination. He states he called poison control last night and they told him to stop Keflex and drink plenty of water.  After increasing water intake urine is slightly lighter in color this morning.  Pt has been followed by device clinic. Will forward to them. I told pt someone would contact him with instructions.

## 2015-07-30 NOTE — Telephone Encounter (Signed)
Returned patient's call after speaking with Chanetta Marshall, NP.  Advised patient to stop taking the Keflex per AS and to contact his PCP regarding his urinary symptoms and the burning in his penis area.  Advised patient to continue with increased water intake until he contacts his PCP for further instructions.  Reviewed instructions to wash device site with antibacterial soap and water BID.  Advised to use clean hands and towel each time.  Patient states he has Dial soap and clean handkerchiefs that he will use.  Patient is aware of appointment for wound re-check next Tuesday, 08/04/15, and is aware to call in the interim with signs/symptoms of infection at his device site, including fever or chills.  Patient is appreciative of call and verbalizes understanding of all instructions.

## 2015-08-04 ENCOUNTER — Ambulatory Visit (INDEPENDENT_AMBULATORY_CARE_PROVIDER_SITE_OTHER): Payer: Medicare Other | Admitting: *Deleted

## 2015-08-04 DIAGNOSIS — Z95810 Presence of automatic (implantable) cardiac defibrillator: Secondary | ICD-10-CM

## 2015-08-04 NOTE — Progress Notes (Signed)
[name] presents to device clinic for wound re-check. Small scab noted over medial edge of incision. No redness, swelling, drainage, fever or chills. Upon palpation scabbed area is unremarkable without tenderness noted by patient. Patient instructed to continue to clean incision with antibacterial soap twice daily and to call device clinic if he notes redness, swelling or drainage. He verbalizes understanding.

## 2015-08-04 NOTE — Progress Notes (Signed)
Cardiology Office Note   Date:  08/05/2015   ID:  [name], DOB [birth year], MRN [id number]  PCP:  Eustaquio Maize, MD  Cardiologist:   Minus Breeding, MD   No chief complaint on file.     History of Present Illness: [name] is a 75 y.o. male who presents for evaluation of known coronary disease. He was previously seen by Dr. Mare Ferrari.  He has a history of CABG many years ago and a redo in 2007.  He has atrial fibrillation and cardiomyopathy. He has had anxiety. He has an ICD. He was seen last fall and subsequently rehospitalized and had a cardiac cath. He was found to have occlusion of the proximal RCA circumflex and LAD. He had a widely patent vein graft sequential to RCA and right coronary distal SVG to ramus intermediate patent LIMA to the LAD. This was most change since previous and he was managed medically. He was in the emergency room most recently following this with some shortness of breath but actually was doing well and was not admitted.  This is my first appt with him.  I have reviewed extensive hospital records.  He actually says he is doing well. Unfortunately he still limited in his activities because of inguinal hernias that he needs to have repaired. He's also going to have colonoscopy prior to this. From a cardiac standpoint he actually denies any cardiovascular symptoms. The patient denies any new symptoms such as chest discomfort, neck or arm discomfort. There has been no new shortness of breath, PND or orthopnea. There have been no reported palpitations, presyncope or syncope.   Past Medical History  Diagnosis Date  . Ischemic cardiomyopathy     a. 06/2012 EF 25% by echo.  . Hypertension   . Hyperlipidemia   . GERD (gastroesophageal reflux disease)   . CAD (coronary artery disease)     a. 1991 s/p CABGx4;  b. redo CABG in 2007 (SVG->OM, VG->PDA->PLV);  c. 01/2011 Cath: LM 80ost, LAD 100p, LCX 100ost, OM1 100, RCA 100, LIMA->LAD ok, VG->PDA ok, VG->OM ok;   d. 03/2012 MV: EF 32% inf & inflat scar w/o ischemia; e. Lexi MV: EF 33%, inf infarct, no ischemia. f. lexiscan 06/12/2014 RCA scar, no ischemia, EF 26%  . Atrial flutter ([place])     a. 01/2011 s/p unsuccessful RFCA complicated by CHB req PPM.  . CHB (complete heart block) ([place])     a. 01/2011: in setting of RFCA, s/p SJM 2110 Accent DC PPM, ser # [id number].  [name] Kitchen Kidney cysts     a. bilateral  . Hiatal hernia     a. 03/01/2013 EGD and esoph dil.  [name] Kitchen BPH (benign prostatic hyperplasia)     a. elevated PSA  . A-fib (HCC)     a. chronic coumadin  . Elevated PSA, less than 10 ng/ml   . Fibromyalgia   . Skin cancer     'above left eyebrow; tip of my nose" (08/29/2012)  . Depression     "since 1986-1987" (08/29/2012)  . Anxiety   . Panic attacks   . Chronic systolic CHF (congestive heart failure), NYHA class 2 ([place])     a. 06/2012 Echo: EF 25%, distal sept and apical AK, sev dil LV, mild LVH, mod dil LA.  [name] Kitchen Pacemaker     explanted 06/15/15 >>> CRT-D STJ device, Dr. Caryl Comes    Past Surgical History  Procedure Laterality Date  . Cardiac catheterization  10/30/2009    Grafts patent. Mild  to moderate LV dysfunction. EF 45%. Managed medically.   . Cardiac catheterization      multiple  . Tonsillectomy  1960  . Cholecystectomy  2007  . Insert / replace / remove pacemaker  01/2011    initial placement  . Inguinal hernia repair  1960's    "left I think"  . Cataract extraction w/ intraocular lens  implant, bilateral  2012  . Cardioversion  05/25/2012    Procedure: CARDIOVERSION;  Surgeon: Darlin Coco, MD;  Location: [facility] ENDOSCOPY;  Service: Cardiovascular;  Laterality: N/A;  . Humerus surgery Left 1960    "growth on the bone taken off; not cancer" (08/29/2012)  . Excisional hemorrhoidectomy  ~ 1980  . Skin cancer excision      'above left eyebrow; tip of my nose" (08/29/2012)  . Atrial flutter ablation  01/2011    "didn't work" (08/29/2012)  . Coronary artery bypass graft  08/27/1989    CABG X 5  .  Coronary artery bypass graft  06/2005    CABG X 3  . Cardiac catheterization N/A 04/28/2015    Procedure: Left Heart Cath and Cors/Grafts Angiography;  Surgeon: Belva Crome, MD;  Location: [facility] CV LAB;  Service: Cardiovascular;  Laterality: N/A;  . Ep implantable device N/A 06/15/2015    STJ, CRT-D, Dr. Caryl Comes     Current Outpatient Prescriptions  Medication Sig Dispense Refill  . acetaminophen (TYLENOL) 500 MG tablet Take 500 mg by mouth every 8 (eight) hours as needed for mild pain.     [name] Kitchen ALPRAZolam (XANAX) 1 MG tablet Take 1 tablet (1 mg total) by mouth 3 (three) times daily as needed. For anxiety 270 tablet 1  . carvedilol (COREG) 6.25 MG tablet Take 1 tablet by mouth 2 (two) times daily.  2  . doxepin (SINEQUAN) 25 MG capsule Take 25-50 mg by mouth 2 (two) times daily. Take 25 mg (1 capsule) by mouth in the morning and 50 mg (2 capsules) by mouth at night.    . fluticasone (FLONASE) 50 MCG/ACT nasal spray Place 2 sprays into both nostrils daily. 16 g 6  . furosemide (LASIX) 20 MG tablet Take 1 tablet (20 mg total) by mouth daily as needed for fluid. One tablet by mouth daily as needed for swelling 30 tablet 5  . gabapentin (NEURONTIN) 100 MG capsule Take 100 mg by mouth 4 (four) times daily.    . hydroxypropyl methylcellulose / hypromellose (ISOPTO TEARS / GONIOVISC) 2.5 % ophthalmic solution Place 1 drop into both eyes 3 (three) times daily as needed for dry eyes.    . isosorbide mononitrate (IMDUR) 60 MG 24 hr tablet Take 60 mg by mouth daily.    [name] Kitchen lisinopril (PRINIVIL,ZESTRIL) 5 MG tablet Take 1 tablet (5 mg total) by mouth every other day. 1 tablet by mouth every other day or as directed 90 tablet 3  . loratadine (CLARITIN) 10 MG tablet Take 10 mg by mouth daily.    . Multiple Vitamin (MULTIVITAMIN) tablet Take 1 tablet by mouth daily.      . Multiple Vitamins-Minerals (PRESERVISION AREDS 2) CAPS Take 2 capsules by mouth daily. Reported on 06/26/2015    . nitroGLYCERIN  (NITROSTAT) 0.4 MG SL tablet DISSOLVE ONE TABLET UNDER THE TONGUE EVERY 5 MINUTES AS NEEDED FOR CHEST PAIN.  DO NOT EXCEED A TOTAL OF 3 DOSES IN 15 MINUTES 100 tablet 3  . omeprazole (PRILOSEC) 20 MG capsule Take 1 capsule (20 mg total) by mouth 2 (two) times daily. [place]  capsule 3  . polyethylene glycol (MIRALAX / GLYCOLAX) packet Take 17 g by mouth daily.     . rosuvastatin (CRESTOR) 10 MG tablet Take 1 tablet (10 mg total) by mouth daily. 90 tablet 3  . warfarin (JANTOVEN) 5 MG tablet Take 0.5-1 tablets (2.5-5 mg total) by mouth daily. 30 tablet 0  . nystatin ointment (MYCOSTATIN) Apply 1 application topically 2 (two) times daily. (Patient not taking: Reported on 08/05/2015) 30 g 1   No current facility-administered medications for this visit.    Allergies:   Prednisone; Sulfa drugs cross reactors; Ultram; and Xarelto    ROS:  Please see the history of present illness.   Otherwise, review of systems are positive for none.   All other systems are reviewed and negative.    PHYSICAL EXAM: VS:  BP 112/70 mmHg  Pulse 72  Ht 5\' 11"  (1.803 m)  Wt 183 lb (83.008 kg)  BMI 25.53 kg/m2 , BMI Body mass index is 25.53 kg/(m^2). GENERAL:  Well appearing HEENT:  Pupils equal round and reactive, fundi not visualized, oral mucosa unremarkable NECK:  Mild jugular venous distention, waveform within normal limits, carotid upstroke brisk and symmetric, no bruits, no thyromegaly LYMPHATICS:  No cervical, inguinal adenopathy LUNGS:  Clear to auscultation bilaterally BACK:  No CVA tenderness CHEST:  Well healed sternotomy scar, the ICD pocket has a small area that is still healing but there is no drainage or erythema in this medial aspect of the wound HEART:  PMI not displaced or sustained,S1 and S2 within normal limits, no S3, no S4, no clicks, no rubs, no murmurs ABD:  Flat, positive bowel sounds normal in frequency in pitch, no bruits, no rebound, no guarding, no midline pulsatile mass, no hepatomegaly, no  splenomegaly EXT:  2 plus pulses upper, mildly reduced dorsalis pedis and posterior tibialis bilaterally lower, no edema, no cyanosis no clubbing SKIN:  No rashes no nodules NEURO:  Cranial nerves II through XII grossly intact, motor grossly intact throughout PSYCH:  Cognitively intact, oriented to person place and time    EKG:  EKG is not ordered today.   Recent Labs: 11/28/2014: TSH 5.469* 03/25/2015: ALT 17 06/26/2015: BUN 14; Creatinine, Ser 1.02; Hemoglobin 13.9; Platelets 151; Potassium 4.4; Sodium 132*    Lipid Panel    Component Value Date/Time   CHOL 131 03/25/2015 0753   TRIG 82 03/25/2015 0753   HDL 42 03/25/2015 0753   CHOLHDL 3.1 03/25/2015 0753   VLDL 16 03/25/2015 0753   LDLCALC 73 03/25/2015 0753   LDLDIRECT 77.3 12/20/2010 0922   Lab Results  Component Value Date   HGBA1C 5.9* 11/28/2014    Wt Readings from Last 3 Encounters:  08/05/15 183 lb (83.008 kg)  06/26/15 183 lb 3.2 oz (83.099 kg)  06/15/15 185 lb (83.915 kg)      Other studies Reviewed: Additional studies/ records that were reviewed today include: Extensive hospital records and previous clinic records.    (Greater than 40 minutes reviewing all data with greater than 50% face to face with the patient). Review of the above records demonstrates:  Please see elsewhere in the note.     ASSESSMENT AND PLAN:  Ischemic heart disease status post CABG remotely and status post redo CABG in 2007:  Coronary anatomy from late 2016 is as above.  He has no symptoms. No change in therapy is indicated.  Hyperlipidemia:  This is recorded as above. He did have his meds switched to Crestor and I will follow-up with  a lipid and liver today.  Hypertensive heart disease without heart failure: His blood pressures well controlled. He will continue the meds as listed.  Anxiety: This seems to be well controlled.  Permanent atrial fibrillation with functioning ventricular pacemaker, following ablation for atrial  flutter resulting in complete heart block. On Coumadin.  He has not tolerated Xarelto in the past.  Mr. Romero Giunta has a CHA2DS2 - VASc score of 4 with a risk of stroke of 4%.  Of note he can come off of his warfarin for planned procedures including his inguinal hernia repair. Bridging anticoagulation is not required.  Systolic heart murmur at aortic area. Echo shows aortic valve sclerosis and mild mitral regurgitation. I will follow this clinically.   Most recent ejection fraction by echo on 04/2015  is 30%, improved from 25%. Of note I don't think his blood pressure will allow med titration at this point.  PAD. Recently seen by Dr. Gae Gallop. No surgery indicated at this point.  Right inguinal hernia, followed by Dr. Zella Richer:   The patient has no high-risk findings currently. From my standpoint no further testing would be necessary prior to surgical repair.    Based on ACC/AHA guidelines, the patient would be at acceptable risk for the planned procedure without further cardiovascular testing.   Current medicines are reviewed at length with the patient today.  The patient does not have concerns regarding medicines.  The following changes have been made:  no change  Labs/ tests ordered today include:   Orders Placed This Encounter  Procedures  . Hepatic function panel  . Lipid Profile  . HgB A1c     Disposition:   FU with me in four months.     Signed, Minus Breeding, MD  08/05/2015 11:36 AM    [facility]

## 2015-08-05 ENCOUNTER — Ambulatory Visit (INDEPENDENT_AMBULATORY_CARE_PROVIDER_SITE_OTHER): Payer: Medicare Other | Admitting: Cardiology

## 2015-08-05 ENCOUNTER — Other Ambulatory Visit: Payer: Medicare Other

## 2015-08-05 ENCOUNTER — Encounter: Payer: Self-pay | Admitting: Cardiology

## 2015-08-05 VITALS — BP 112/70 | HR 72 | Ht 71.0 in | Wt 183.0 lb

## 2015-08-05 DIAGNOSIS — Z79899 Other long term (current) drug therapy: Secondary | ICD-10-CM

## 2015-08-05 DIAGNOSIS — E785 Hyperlipidemia, unspecified: Secondary | ICD-10-CM

## 2015-08-05 DIAGNOSIS — Z0000 Encounter for general adult medical examination without abnormal findings: Secondary | ICD-10-CM

## 2015-08-05 NOTE — Patient Instructions (Signed)
Medication Instructions:  The current medical regimen is effective;  continue present plan and medications.  Labwork: Please have fasting lab work drawn at [facility] (lipid, liver and HA1c)  Follow-Up: Follow up in 4 months with Dr. Percival Spanish.  You will receive a letter in the mail 2 months before you are due.  Please call us when you receive this letter to schedule your follow up appointment.  If you need a refill on your cardiac medications before your next appointment, please call your pharmacy.  Thank you for choosing [facility]!!

## 2015-08-06 LAB — HEPATIC FUNCTION PANEL
ALBUMIN: 4.1 g/dL (ref 3.5–4.8)
ALT: 17 IU/L (ref 0–44)
AST: 26 IU/L (ref 0–40)
Alkaline Phosphatase: 90 IU/L (ref 39–117)
BILIRUBIN TOTAL: 1.1 mg/dL (ref 0.0–1.2)
Bilirubin, Direct: 0.39 mg/dL (ref 0.00–0.40)
Total Protein: 6.4 g/dL (ref 6.0–8.5)

## 2015-08-06 LAB — LIPID PANEL
CHOL/HDL RATIO: 2.7 ratio (ref 0.0–5.0)
Cholesterol, Total: 111 mg/dL (ref 100–199)
HDL: 41 mg/dL (ref >39)
LDL Calculated: 58 mg/dL (ref 0–99)
TRIGLYCERIDES: 61 mg/dL (ref 0–149)
VLDL CHOLESTEROL CAL: 12 mg/dL (ref 5–40)

## 2015-08-06 LAB — HEMOGLOBIN A1C
Est. average glucose Bld gHb Est-mCnc: 128 mg/dL
Hgb A1c MFr Bld: 6.1 % — ABNORMAL HIGH (ref 4.8–5.6)

## 2015-08-18 ENCOUNTER — Encounter: Payer: Self-pay | Admitting: Family

## 2015-08-18 ENCOUNTER — Ambulatory Visit (INDEPENDENT_AMBULATORY_CARE_PROVIDER_SITE_OTHER): Payer: Medicare Other | Admitting: Family

## 2015-08-18 ENCOUNTER — Ambulatory Visit (INDEPENDENT_AMBULATORY_CARE_PROVIDER_SITE_OTHER): Payer: Medicare Other | Admitting: Pharmacist Clinician (PhC)/ Clinical Pharmacy Specialist

## 2015-08-18 VITALS — BP 132/81 | HR 80 | Temp 96.8°F | Ht 71.0 in | Wt 185.6 lb

## 2015-08-18 DIAGNOSIS — J309 Allergic rhinitis, unspecified: Secondary | ICD-10-CM | POA: Diagnosis not present

## 2015-08-18 DIAGNOSIS — I482 Chronic atrial fibrillation, unspecified: Secondary | ICD-10-CM

## 2015-08-18 LAB — COAGUCHEK XS/INR WAIVED
INR: 2.8 — AB (ref 0.9–1.1)
PROTHROMBIN TIME: 33.9 s

## 2015-08-18 MED ORDER — CETIRIZINE HCL 5 MG PO TABS: 5.0000 mg | ORAL_TABLET | Freq: Every day | ORAL | Status: DC

## 2015-08-18 NOTE — Progress Notes (Signed)
   Subjective:    Patient ID: [name], male    DOB: 1940-11-03, 75 y.o.   MRN: [id number]  PT presents to the office today for SOB and "feeling like he can not get a big breath". PT states he has been taking his flonase and mucinex with relief.  Pt has a cardiologists he see's every 4 months for CHF, A FIB, and CAD.  Shortness of Breath This is a new problem. The current episode started in the past 7 days. The problem occurs intermittently. The problem has been waxing and waning. Associated symptoms include sputum production. Pertinent negatives include no ear pain, fever, leg pain, leg swelling, rhinorrhea, sore throat or wheezing. He has tried rest for the symptoms. The treatment provided mild relief. His past medical history is significant for allergies and a heart failure. There is no history of asthma or COPD.      Review of Systems  Constitutional: Negative.  Negative for fever.  HENT: Negative.  Negative for ear pain, rhinorrhea and sore throat.   Respiratory: Positive for sputum production and shortness of breath. Negative for wheezing.   Cardiovascular: Negative.  Negative for leg swelling.  Gastrointestinal: Negative.   Endocrine: Negative.   Genitourinary: Negative.   Musculoskeletal: Negative.   Neurological: Negative.   Hematological: Negative.   Psychiatric/Behavioral: Negative.   All other systems reviewed and are negative.      Objective:   Physical Exam  Constitutional: He is oriented to person, place, and time. He appears well-developed and well-nourished. No distress.  HENT:  Head: Normocephalic.  Right Ear: External ear normal.  Left Ear: External ear normal.  Nasal passage erythemas with mild swelling  Oropharynx erythemas  Eyes: Pupils are equal, round, and reactive to light. Right eye exhibits no discharge. Left eye exhibits no discharge.  Neck: Normal range of motion. Neck supple. No thyromegaly present.  Cardiovascular: Normal rate, regular rhythm  and intact distal pulses.   Murmur heard. Pulmonary/Chest: Effort normal. No respiratory distress. He has no wheezes.  Diminished breath sounds   Abdominal: Soft. Bowel sounds are normal. He exhibits no distension. There is no tenderness.  Musculoskeletal: Normal range of motion. He exhibits no edema or tenderness.  Neurological: He is alert and oriented to person, place, and time.  Skin: Skin is warm and dry. No rash noted. No erythema.  Psychiatric: He has a normal mood and affect. His behavior is normal. Judgment and thought content normal.  Vitals reviewed.     BP 132/81 mmHg  Pulse 80  Temp(Src) 96.8 F (36 C) (Oral)  Ht 5\' 11"  (1.803 m)  Wt 185 lb 9.6 oz (84.188 kg)  BMI 25.90 kg/m2  SpO2 99%     Assessment & Plan:  1. Allergic rhinitis, unspecified allergic rhinitis type -Continue Flonase and Mucinex -Avoid allergens when possible -Humidifier at night -If does not improve RTO  - cetirizine (ZYRTEC) 5 MG tablet; Take 1 tablet (5 mg total) by mouth daily.  Dispense: 90 tablet; Refill: [place], FNP

## 2015-08-18 NOTE — Patient Instructions (Addendum)
Anticoagulation Dose Instructions as of 08/18/2015      Dorene Grebe Tue Wed Thu Fri Sat   New Dose 5 mg 5 mg 5 mg 5 mg 5 mg 5 mg 2.5 mg    Description        INR 2.8 Continue taking 1 tablet daily      Colonoscopy on Tuesday. Will stop warfarin on Saturday, Sunday, and Monday. Start back on warfarin Tuesday night. Take 1.5 tablets on Tuesday night and Wednesday night after colonoscopy then continue regular schedule.

## 2015-08-18 NOTE — Patient Instructions (Signed)
Allergic Rhinitis Allergic rhinitis is when the mucous membranes in the nose respond to allergens. Allergens are particles in the air that cause your body to have an allergic reaction. This causes you to release allergic antibodies. Through a chain of events, these eventually cause you to release histamine into the blood stream. Although meant to protect the body, it is this release of histamine that causes your discomfort, such as frequent sneezing, congestion, and an itchy, runny nose.  CAUSES Seasonal allergic rhinitis (hay fever) is caused by pollen allergens that may come from grasses, trees, and weeds. Year-round allergic rhinitis (perennial allergic rhinitis) is caused by allergens such as house dust mites, pet dander, and mold spores. SYMPTOMS  Nasal stuffiness (congestion).  Itchy, runny nose with sneezing and tearing of the eyes. DIAGNOSIS Your health care provider can help you determine the allergen or allergens that trigger your symptoms. If you and your health care provider are unable to determine the allergen, skin or blood testing may be used. Your health care provider will diagnose your condition after taking your health history and performing a physical exam. Your health care provider may assess you for other related conditions, such as asthma, pink eye, or an ear infection. TREATMENT Allergic rhinitis does not have a cure, but it can be controlled by:  Medicines that block allergy symptoms. These may include allergy shots, nasal sprays, and oral antihistamines.  Avoiding the allergen. Hay fever may often be treated with antihistamines in pill or nasal spray forms. Antihistamines block the effects of histamine. There are over-the-counter medicines that may help with nasal congestion and swelling around the eyes. Check with your health care provider before taking or giving this medicine. If avoiding the allergen or the medicine prescribed do not work, there are many new medicines  your health care provider can prescribe. Stronger medicine may be used if initial measures are ineffective. Desensitizing injections can be used if medicine and avoidance does not work. Desensitization is when a patient is given ongoing shots until the body becomes less sensitive to the allergen. Make sure you follow up with your health care provider if problems continue. HOME CARE INSTRUCTIONS It is not possible to completely avoid allergens, but you can reduce your symptoms by taking steps to limit your exposure to them. It helps to know exactly what you are allergic to so that you can avoid your specific triggers. SEEK MEDICAL CARE IF:  You have a fever.  You develop a cough that does not stop easily (persistent).  You have shortness of breath.  You start wheezing.  Symptoms interfere with normal daily activities.   This information is not intended to replace advice given to you by your health care provider. Make sure you discuss any questions you have with your health care provider.   Document Released: 01/11/2001 Document Revised: 05/09/2014 Document Reviewed: 12/24/2012 Elsevier Interactive Patient Education 2016 [employer]  

## 2015-08-19 ENCOUNTER — Telehealth: Payer: Self-pay | Admitting: Internal Medicine

## 2015-08-19 NOTE — Telephone Encounter (Signed)
Returned call to patient.He stated he is scheduled to have colonoscopy Tue 08/25/15 was told to hold coumadin 3 days before procedure.Stated he wanted to ask Dr.Klein if ok to have colonoscopy and if ok to hold coumadin.Message sent to [facility] [place].

## 2015-08-19 NOTE — Telephone Encounter (Signed)
Pt having colonoscopy 08-25-15 and needs to know if any problem having it done due to recent implant-pls call

## 2015-08-19 NOTE — Telephone Encounter (Signed)
Returned call to patient Dr.Klein's recommendations given.

## 2015-08-19 NOTE — Telephone Encounter (Signed)
Ok to stop coumadin for colonoscopy.  Typically it is 4 days

## 2015-08-20 ENCOUNTER — Encounter: Payer: Self-pay | Admitting: Family Medicine

## 2015-08-20 ENCOUNTER — Ambulatory Visit (INDEPENDENT_AMBULATORY_CARE_PROVIDER_SITE_OTHER): Payer: Medicare Other | Admitting: Family Medicine

## 2015-08-20 VITALS — BP 139/84 | HR 71 | Temp 97.5°F | Ht 71.0 in | Wt 186.6 lb

## 2015-08-20 DIAGNOSIS — J069 Acute upper respiratory infection, unspecified: Secondary | ICD-10-CM

## 2015-08-20 NOTE — Progress Notes (Signed)
BP 139/84 mmHg  Pulse 71  Temp(Src) 97.5 F (36.4 C) (Oral)  Ht 5\' 11"  (1.803 m)  Wt 186 lb 9.6 oz (84.641 kg)  BMI 26.04 kg/m2   Subjective:    Patient ID: [name], male    DOB: 1940/10/21, 75 y.o.   MRN: [id number]  HPI: [name] is a 75 y.o. male presenting on 08/20/2015 for Nasal Congestion   HPI Nasal congestion and cough Patient has a little bit of a residual nasal congestion and cough but it is greatly improved on the Flonase and the Mucinex and the antihistamine. He is coming in today because he just wants to make sure that he doesn't have pneumonia because he is going for a procedure soon. He denies any fevers or chills or shortness of breath or wheezing.  Relevant past medical, surgical, family and social history reviewed and updated as indicated. Interim medical history since our last visit reviewed. Allergies and medications reviewed and updated.  Review of Systems  Constitutional: Negative for fever and chills.  HENT: Positive for congestion, postnasal drip, sinus pressure and sneezing. Negative for ear discharge, ear pain, rhinorrhea, sore throat and voice change.   Eyes: Negative for pain, discharge, redness and visual disturbance.  Respiratory: Negative for cough, shortness of breath and wheezing.   Cardiovascular: Negative for chest pain and leg swelling.  Gastrointestinal: Negative for abdominal pain, diarrhea and constipation.  Genitourinary: Negative for difficulty urinating.  Musculoskeletal: Negative for back pain and gait problem.  Skin: Negative for rash.  Neurological: Negative for syncope, light-headedness and headaches.  All other systems reviewed and are negative.  Per HPI unless specifically indicated above    Medication List       This list is accurate as of: 08/20/15  3:22 PM.  Always use your most recent med list.               acetaminophen 500 MG tablet  Commonly known as:  TYLENOL  Take 500 mg by mouth every 8 (eight)  hours as needed for mild pain.     ALPRAZolam 1 MG tablet  Commonly known as:  XANAX  Take 1 tablet (1 mg total) by mouth 3 (three) times daily as needed. For anxiety     carvedilol 6.25 MG tablet  Commonly known as:  COREG  Take 1 tablet by mouth 2 (two) times daily.     cetirizine 5 MG tablet  Commonly known as:  ZYRTEC  Take 1 tablet (5 mg total) by mouth daily.     doxepin 25 MG capsule  Commonly known as:  SINEQUAN  Take 25-50 mg by mouth 2 (two) times daily. Take 25 mg (1 capsule) by mouth in the morning and 50 mg (2 capsules) by mouth at night.     fluticasone 50 MCG/ACT nasal spray  Commonly known as:  FLONASE  Place 2 sprays into both nostrils daily.     furosemide 20 MG tablet  Commonly known as:  LASIX  Take 1 tablet (20 mg total) by mouth daily as needed for fluid. One tablet by mouth daily as needed for swelling     gabapentin 100 MG capsule  Commonly known as:  NEURONTIN  Take 100 mg by mouth 4 (four) times daily.     hydroxypropyl methylcellulose / hypromellose 2.5 % ophthalmic solution  Commonly known as:  ISOPTO TEARS / GONIOVISC  Place 1 drop into both eyes 3 (three) times daily as needed for dry eyes.  isosorbide mononitrate 60 MG 24 hr tablet  Commonly known as:  IMDUR  Take 60 mg by mouth daily.     lisinopril 5 MG tablet  Commonly known as:  PRINIVIL,ZESTRIL  Take 1 tablet (5 mg total) by mouth every other day. 1 tablet by mouth every other day or as directed     loratadine 10 MG tablet  Commonly known as:  CLARITIN  Take 10 mg by mouth daily.     MUCINEX DM PO  Take by mouth.     multivitamin tablet  Take 1 tablet by mouth daily.     nitroGLYCERIN 0.4 MG SL tablet  Commonly known as:  NITROSTAT  DISSOLVE ONE TABLET UNDER THE TONGUE EVERY 5 MINUTES AS NEEDED FOR CHEST PAIN.  DO NOT EXCEED A TOTAL OF 3 DOSES IN 15 MINUTES     nystatin ointment  Commonly known as:  MYCOSTATIN  Apply 1 application topically 2 (two) times daily.      omeprazole 20 MG capsule  Commonly known as:  PRILOSEC  Take 1 capsule (20 mg total) by mouth 2 (two) times daily.     polyethylene glycol packet  Commonly known as:  MIRALAX / GLYCOLAX  Take 17 g by mouth daily.     PRESERVISION AREDS 2 Caps  Take 2 capsules by mouth daily. Reported on 06/26/2015     rosuvastatin 10 MG tablet  Commonly known as:  CRESTOR  Take 1 tablet (10 mg total) by mouth daily.     warfarin 5 MG tablet  Commonly known as:  JANTOVEN  Take 0.5-1 tablets (2.5-5 mg total) by mouth daily.           Objective:    BP 139/84 mmHg  Pulse 71  Temp(Src) 97.5 F (36.4 C) (Oral)  Ht 5\' 11"  (1.803 m)  Wt 186 lb 9.6 oz (84.641 kg)  BMI 26.04 kg/m2  Wt Readings from Last 3 Encounters:  08/20/15 186 lb 9.6 oz (84.641 kg)  08/18/15 185 lb 9.6 oz (84.188 kg)  08/05/15 183 lb (83.008 kg)    Physical Exam  Constitutional: He is oriented to person, place, and time. He appears well-developed and well-nourished. No distress.  HENT:  Right Ear: Tympanic membrane, external ear and ear canal normal.  Left Ear: Tympanic membrane, external ear and ear canal normal.  Nose: Mucosal edema and rhinorrhea present. No sinus tenderness. No epistaxis. Right sinus exhibits maxillary sinus tenderness. Right sinus exhibits no frontal sinus tenderness. Left sinus exhibits maxillary sinus tenderness. Left sinus exhibits no frontal sinus tenderness.  Mouth/Throat: Uvula is midline and mucous membranes are normal. Posterior oropharyngeal edema and posterior oropharyngeal erythema present. No oropharyngeal exudate or tonsillar abscesses.  Eyes: Conjunctivae and EOM are normal. Pupils are equal, round, and reactive to light. Right eye exhibits no discharge. No scleral icterus.  Neck: Neck supple. No thyromegaly present.  Cardiovascular: Normal rate, regular rhythm, normal heart sounds and intact distal pulses.   No murmur heard. Pulmonary/Chest: Effort normal and breath sounds normal. No  respiratory distress. He has no wheezes. He has no rales.  Musculoskeletal: Normal range of motion. He exhibits no edema.  Lymphadenopathy:    He has no cervical adenopathy.  Neurological: He is alert and oriented to person, place, and time. Coordination normal.  Skin: Skin is warm and dry. No rash noted. He is not diaphoretic.  Psychiatric: He has a normal mood and affect. His behavior is normal.  Nursing note and vitals reviewed.   Results for  orders placed or performed in visit on 08/18/15  CoaguChek XS/INR Waived  Result Value Ref Range   INR 2.8 (H) 0.9 - 1.1   Prothrombin Time 33.9 sec      Assessment & Plan:       Problem List Items Addressed This Visit    None    Visit Diagnoses    Upper respiratory infection with cough and congestion    -  Primary    Patient was given Flonase and told to take an antihistamine and Mucinex and feels a lot better but just wanted to make sure it was clear before his procedure        Follow up plan: Return if symptoms worsen or fail to improve.  Counseling provided for all of the vaccine components No orders of the defined types were placed in this encounter.    Caryl Pina, MD [facility] 08/20/2015, 3:22 PM

## 2015-08-25 ENCOUNTER — Encounter: Payer: Self-pay | Admitting: Pharmacist Clinician (PhC)/ Clinical Pharmacy Specialist

## 2015-09-04 ENCOUNTER — Telehealth: Payer: Self-pay | Admitting: Internal Medicine

## 2015-09-04 NOTE — Telephone Encounter (Signed)
New Message  Pt wanted to know if home monitor is working. Please call back and discuss.

## 2015-09-04 NOTE — Telephone Encounter (Signed)
Spoke w/ pt and informed him that his home monitor is working and updating like it is suppose to.

## 2015-09-08 ENCOUNTER — Ambulatory Visit (INDEPENDENT_AMBULATORY_CARE_PROVIDER_SITE_OTHER): Payer: Medicare Other | Admitting: Pharmacist Clinician (PhC)/ Clinical Pharmacy Specialist

## 2015-09-08 ENCOUNTER — Encounter (INDEPENDENT_AMBULATORY_CARE_PROVIDER_SITE_OTHER): Payer: Self-pay

## 2015-09-08 DIAGNOSIS — I482 Chronic atrial fibrillation, unspecified: Secondary | ICD-10-CM

## 2015-09-08 LAB — COAGUCHEK XS/INR WAIVED
INR: 2.9 — ABNORMAL HIGH (ref 0.9–1.1)
PROTHROMBIN TIME: 35.1 s

## 2015-09-08 NOTE — Patient Instructions (Signed)
Anticoagulation Dose Instructions as of 09/08/2015      [name] Tue Wed Thu Fri Sat   New Dose 5 mg 5 mg 5 mg 5 mg 5 mg 5 mg 2.5 mg    Description        Continue taking it the same way  INR today 2.9

## 2015-09-15 ENCOUNTER — Ambulatory Visit (INDEPENDENT_AMBULATORY_CARE_PROVIDER_SITE_OTHER): Payer: Medicare Other | Admitting: Internal Medicine

## 2015-09-15 ENCOUNTER — Encounter: Payer: Self-pay | Admitting: Internal Medicine

## 2015-09-15 VITALS — BP 126/74 | HR 71 | Ht 71.0 in | Wt 182.0 lb

## 2015-09-15 DIAGNOSIS — I4819 Other persistent atrial fibrillation: Secondary | ICD-10-CM

## 2015-09-15 DIAGNOSIS — Z95810 Presence of automatic (implantable) cardiac defibrillator: Secondary | ICD-10-CM | POA: Diagnosis not present

## 2015-09-15 DIAGNOSIS — I255 Ischemic cardiomyopathy: Secondary | ICD-10-CM | POA: Diagnosis not present

## 2015-09-15 DIAGNOSIS — I5022 Chronic systolic (congestive) heart failure: Secondary | ICD-10-CM

## 2015-09-15 DIAGNOSIS — I481 Persistent atrial fibrillation: Secondary | ICD-10-CM | POA: Diagnosis not present

## 2015-09-15 DIAGNOSIS — I442 Atrioventricular block, complete: Secondary | ICD-10-CM | POA: Diagnosis not present

## 2015-09-15 DIAGNOSIS — I493 Ventricular premature depolarization: Secondary | ICD-10-CM | POA: Diagnosis not present

## 2015-09-15 LAB — CUP PACEART INCLINIC DEVICE CHECK
Battery Remaining Longevity: 74.4
Brady Statistic RA Percent Paced: 0 %
HighPow Impedance: 67.5 Ohm
Implantable Lead Implant Date: 20120927
Implantable Lead Implant Date: 20170213
Implantable Lead Location: 753859
Implantable Lead Location: 753860
Lead Channel Pacing Threshold Amplitude: 0.75 V
Lead Channel Pacing Threshold Amplitude: 1 V
Lead Channel Pacing Threshold Pulse Width: 0.5 ms
Lead Channel Pacing Threshold Pulse Width: 0.5 ms
Lead Channel Setting Pacing Amplitude: 2.5 V
Lead Channel Setting Pacing Pulse Width: 0.5 ms
MDC IDC LEAD IMPLANT DT: 20170213
MDC IDC LEAD LOCATION: 753858
MDC IDC LEAD MODEL: 7122
MDC IDC MSMT LEADCHNL LV IMPEDANCE VALUE: 512.5 Ohm
MDC IDC MSMT LEADCHNL LV PACING THRESHOLD AMPLITUDE: 1 V
MDC IDC MSMT LEADCHNL LV PACING THRESHOLD PULSEWIDTH: 0.8 ms
MDC IDC MSMT LEADCHNL LV PACING THRESHOLD PULSEWIDTH: 0.8 ms
MDC IDC MSMT LEADCHNL RA IMPEDANCE VALUE: 387.5 Ohm
MDC IDC MSMT LEADCHNL RV IMPEDANCE VALUE: 625 Ohm
MDC IDC MSMT LEADCHNL RV PACING THRESHOLD AMPLITUDE: 0.75 V
MDC IDC MSMT LEADCHNL RV SENSING INTR AMPL: 11.8 mV
MDC IDC SESS DTM: 20170516145943
MDC IDC SET LEADCHNL LV PACING AMPLITUDE: 2 V
MDC IDC SET LEADCHNL LV PACING PULSEWIDTH: 0.8 ms
MDC IDC SET LEADCHNL RV SENSING SENSITIVITY: 2 mV
MDC IDC STAT BRADY RV PERCENT PACED: 99.4 %
Pulse Gen Serial Number: 7335682

## 2015-09-15 NOTE — Patient Instructions (Signed)
Medication Instructions: - Your physician recommends that you continue on your current medications as directed. Please refer to the Current Medication list given to you today.  Labwork: - none  Procedures/Testing: - none  Follow-Up: - Remote monitoring is used to monitor your Pacemaker of ICD from home. This monitoring reduces the number of office visits required to check your device to one time per year. It allows [place] to keep an eye on the functioning of your device to ensure it is working properly. You are scheduled for a device check from home on 12/15/15. You may send your transmission at any time that day. If you have a wireless device, the transmission will be sent automatically. After your physician reviews your transmission, you will receive a postcard with your next transmission date.  - Your physician wants you to follow-up in: 9 months with Dr. Caryl Comes. You will receive a reminder letter in the mail two months in advance. If you don't receive a letter, please call our office to schedule the follow-up appointment.  Any Additional Special Instructions Will Be Listed Below (If Applicable).     If you need a refill on your cardiac medications before your next appointment, please call your pharmacy.

## 2015-09-15 NOTE — Progress Notes (Signed)
Patient Care Team: Eustaquio Maize, MD as PCP - General (Pediatrics)   HPI  [name] is a 75 y.o. male Seen in follow-up for CRT upgrade accomplished 2/17. He has a history of complete heart block with progressive left ventricular dysfunction from 2000 1245--percent. He has coronary artery disease with prior bypass and redo bypass and a nonischemic Myoview 2016; cath to 12/16 demonstrated patent grafts.  Permanent atrial fibrillation. No prior stroke   The patient denies chest pain, much less shortness of breath, no  nocturnal dyspnea, orthopnea or peripheral edema.  There have been no palpitations, lightheadedness or syncope. He is limited as below in the upper had a stroke   he has desire for having her hernia repair. It is during with his ability to walk.      Records and Results Reviewed   Past Medical History  Diagnosis Date  . Ischemic cardiomyopathy     a. 06/2012 EF 25% by echo.  . Hypertension   . Hyperlipidemia   . GERD (gastroesophageal reflux disease)   . CAD (coronary artery disease)     a. 1991 s/p CABGx4;  b. redo CABG in 2007 (SVG->OM, VG->PDA->PLV);  c. 01/2011 Cath: LM 80ost, LAD 100p, LCX 100ost, OM1 100, RCA 100, LIMA->LAD ok, VG->PDA ok, VG->OM ok;  d. 03/2012 MV: EF 32% inf & inflat scar w/o ischemia; e. Lexi MV: EF 33%, inf infarct, no ischemia. f. lexiscan 06/12/2014 RCA scar, no ischemia, EF 26%  . Atrial flutter ([place])     a. 01/2011 s/p unsuccessful RFCA complicated by CHB req PPM.  . CHB (complete heart block) ([place])     a. 01/2011: in setting of RFCA, s/p SJM 2110 Accent DC PPM, ser # [id number].  [name] Kitchen Kidney cysts     a. bilateral  . Hiatal hernia     a. 03/01/2013 EGD and esoph dil.  [name] Kitchen BPH (benign prostatic hyperplasia)     a. elevated PSA  . A-fib (H[facility])     a. chronic coumadin  . Elevated PSA, less than 10 ng/ml   . Fibromyalgia   . Skin cancer     'above left eyebrow; tip of my nose" (08/29/2012)  . Depression     "since 1986-1987"  (08/29/2012)  . Anxiety   . Panic attacks   . Chronic systolic CHF (congestive heart failure), NYHA class 2 ([place])     a. 06/2012 Echo: EF 25%, distal sept and apical AK, sev dil LV, mild LVH, mod dil LA.  [name] Kitchen Pacemaker     explanted 06/15/15 >>> CRT-D STJ device, Dr. Caryl Comes    Past Surgical History  Procedure Laterality Date  . Cardiac catheterization  10/30/2009    Grafts patent. Mild to moderate LV dysfunction. EF 45%. Managed medically.   . Cardiac catheterization      multiple  . Tonsillectomy  1960  . Cholecystectomy  2007  . Insert / replace / remove pacemaker  01/2011    initial placement  . Inguinal hernia repair  1960's    "left I think"  . Cataract extraction w/ intraocular lens  implant, bilateral  2012  . Cardioversion  05/25/2012    Procedure: CARDIOVERSION;  Surgeon: Darlin Coco, MD;  [facility] ENDOSCOPY;  Service: Cardiovascular;  Laterality: N/A;  . Humerus surgery Left 1960    "growth on the bone taken off; not cancer" (08/29/2012)  . Excisional hemorrhoidectomy  ~ 1980  . Skin cancer excision      'above left  eyebrow; tip of my nose" (08/29/2012)  . Atrial flutter ablation  01/2011    "didn't work" (08/29/2012)  . Coronary artery bypass graft  08/27/1989    CABG X 5  . Coronary artery bypass graft  06/2005    CABG X 3  . Cardiac catheterization N/A 04/28/2015    Procedure: Left Heart Cath and Cors/Grafts Angiography;  Surgeon: Belva Crome, MD;  Location: [place] CV LAB;  Service: Cardiovascular;  Laterality: N/A;  . Ep implantable device N/A 06/15/2015    STJ, CRT-D, Dr. Caryl Comes    Current Outpatient Prescriptions  Medication Sig Dispense Refill  . acetaminophen (TYLENOL) 500 MG tablet Take 500 mg by mouth every 8 (eight) hours as needed for mild pain.     [name] Kitchen ALPRAZolam (XANAX) 1 MG tablet Take 1 tablet (1 mg total) by mouth 3 (three) times daily as needed. For anxiety 270 tablet 1  . carvedilol (COREG) 6.25 MG tablet Take 1 tablet by mouth 2 (two) times  daily.  2  . Dextromethorphan-Guaifenesin (MUCINEX DM PO) Take by mouth.    . doxepin (SINEQUAN) 25 MG capsule Take 25-50 mg by mouth 2 (two) times daily. Take 25 mg (1 capsule) by mouth in the morning and 50 mg (2 capsules) by mouth at night.    . fluticasone (FLONASE) 50 MCG/ACT nasal spray Place 2 sprays into both nostrils daily. 16 g 6  . furosemide (LASIX) 20 MG tablet Take 1 tablet (20 mg total) by mouth daily as needed for fluid. One tablet by mouth daily as needed for swelling 30 tablet 5  . gabapentin (NEURONTIN) 100 MG capsule Take 100 mg by mouth 4 (four) times daily.    . hydroxypropyl methylcellulose / hypromellose (ISOPTO TEARS / GONIOVISC) 2.5 % ophthalmic solution Place 1 drop into both eyes 3 (three) times daily as needed for dry eyes.    . isosorbide mononitrate (IMDUR) 60 MG 24 hr tablet Take 60 mg by mouth daily.    [name] Kitchen lisinopril (PRINIVIL,ZESTRIL) 5 MG tablet Take 1 tablet (5 mg total) by mouth every other day. 1 tablet by mouth every other day or as directed 90 tablet 3  . loratadine (CLARITIN) 10 MG tablet Take 10 mg by mouth daily.    . Multiple Vitamin (MULTIVITAMIN) tablet Take 1 tablet by mouth daily.      . Multiple Vitamins-Minerals (PRESERVISION AREDS 2) CAPS Take 2 capsules by mouth daily. Reported on 06/26/2015    . nitroGLYCERIN (NITROSTAT) 0.4 MG SL tablet DISSOLVE ONE TABLET UNDER THE TONGUE EVERY 5 MINUTES AS NEEDED FOR CHEST PAIN.  DO NOT EXCEED A TOTAL OF 3 DOSES IN 15 MINUTES 100 tablet 3  . nystatin ointment (MYCOSTATIN) Apply 1 application topically 2 (two) times daily. 30 g 1  . omeprazole (PRILOSEC) 20 MG capsule Take 1 capsule (20 mg total) by mouth 2 (two) times daily. 180 capsule 3  . polyethylene glycol (MIRALAX / GLYCOLAX) packet Take 17 g by mouth daily.     . rosuvastatin (CRESTOR) 10 MG tablet Take 1 tablet (10 mg total) by mouth daily. 90 tablet 3  . warfarin (JANTOVEN) 5 MG tablet Take 0.5-1 tablets (2.5-5 mg total) by mouth daily. 30 tablet 0    No current facility-administered medications for this visit.    Allergies  Allergen Reactions  . Prednisone Itching  . Sulfa Drugs Cross Reactors Other (See Comments)    unknown  . Ultram [Tramadol Hcl] Itching    "don't remember how bad"  .  Xarelto [Rivaroxaban] Other (See Comments)    Itching       Review of Systems negative except from HPI and PMH  Physical Exam BP 126/74 mmHg  Pulse 71  Ht 5\' 11"  (1.803 m)  Wt 182 lb (82.555 kg)  BMI 25.40 kg/m2 Well developed and well nourished in no acute distress HENT normal E scleral and icterus clear Neck Supple JVP flat; carotids brisk and full Clear to ausculation  Gross*Regular rate and rhythm, no murmurs gallops or rub Soft with active bowel sounds No clubbing cyanosis  Edema Alert and oriented, grossly normal motor and sensory function Skin Warm and Dry  ECG demonstrated biventricular pacing at 71   Assessment and  Plan  ICM s/-p CABG  CRT-D  Atrial fibrillation   preop eval  He is tolerating his medications well.   I see no problems with him going to his hernia surgery; this was also addressed by Dr. JH/17  He could also have his toenail removed. Holding his warfarin as necessary is okay  I will defer to Dr. [facility] repeat assessment of LV function following CRT  We will see him in 9 months

## 2015-09-21 ENCOUNTER — Ambulatory Visit: Payer: Self-pay | Admitting: General Surgery

## 2015-09-23 ENCOUNTER — Ambulatory Visit: Payer: Self-pay | Admitting: General Surgery

## 2015-09-23 ENCOUNTER — Encounter: Payer: Self-pay | Admitting: Internal Medicine

## 2015-09-23 NOTE — H&P (Signed)
ames D. Meng 09/23/2015 8:33 AM Location: [facility] Patient #: [id number] DOB: [birth year] Widowed / Language: [name] / Race: White Male  History of Present Illness Brett Hollingshead MD; 09/23/2015 9:20 AM) The patient is a 75 year old male.   Note:Mr. Kamradt returns for preoperative visit. He has seen both Dr. Caryl Comes and Dr. Percival Spanish. They feel it is okay for him to proceed with the hernia repair. He states the hernia is a little larger and its limiting his ability to walk because of discomfort.  Allergies (Sonya Bynum, CMA; 09/23/2015 8:33 AM) Sulfa Antibiotics Ultram *ANALGESICS - OPIOID* Xarelto *ANTICOAGULANTS*  Medication History (Sonya Bynum, CMA; 09/23/2015 8:33 AM) ALPRAZolam (1MG  Tablet, Oral tid) Active. Doxepin HCl (25MG  Capsule, Oral tid) Active. Furosemide (20MG  Tablet, Oral prn) Active. Warfarin Sodium (5MG  Tablet, Oral daily) Active. Loratadine (10MG  Tablet, Oral daily) Active. Isosorbide Mononitrate ER (60MG  Tablet ER 24HR, Oral daily) Active. Omeprazole (20MG  Capsule DR, Oral bid) Active. PreserVision AREDS (2 capsules Oral daily) Active. Nitrostat (0.3MG  Tab Sublingual, Sublingual prn) Active. Lisinopril (5MG  Tablet, Oral daily) Active. (1 tablet eod) Multivitamin (Oral daily) Active. Acetaminophen (500MG  Tablet, Oral tid) Active. Medications Reconciled    Vitals (Sonya Bynum CMA; 09/23/2015 8:33 AM) 09/23/2015 8:33 AM Weight: 184 lb Height: 69in Body Surface Area: 1.99 m Body Mass Index: 27.17 kg/m  Temp.: 67F(Temporal)  Pulse: 79 (Regular)  BP: 130/80 (Sitting, Left Arm, Standard)      Physical Exam Brett Hollingshead MD; 09/23/2015 9:22 AM)  The physical exam findings are as follows: Note:Endo-well-developed, well-nourished elderly male in no acute distress.  Eyes-no icterus  Cardiac regular rate and rhythm  Lungs clear to auscultation.  Abdomen soft with small reducible umbilical  hernia  GU-left inguinal scar. Moderate size right inguinal bulge that is reducible in the supine position.    Assessment & Plan Brett Hollingshead MD; 09/23/2015 9:18 AM)  RIGHT INGUINAL HERNIA (K40.90) Impression: A little larger. He has seen Dr. Percival Spanish and Dr. Caryl Comes who feel it is ok to proceed with the hernia repair.  Plan: Right inguinal hernia repair with mesh. Stop Coumadin 5 days before surgery. I have explained the procedure, risks, and aftercare of inguinal hernia repair. Risks include but are not limited to bleeding, infection, wound problems, anesthesia, recurrence, bladder or intestine injury, urinary retention, testicular dysfunction, chronic pain, mesh problems. He seems to understand and wants to proceed.  Jackolyn Confer, MD

## 2015-09-23 NOTE — Progress Notes (Signed)
The patient walked in to the office this morning to discuss a "smothering" feeling he has been having over the last 2-3 days. He recently saw Dr. Caryl Comes and has had no other change in symptoms. He states he walked up a flight of stairs this morning and felt fine with his heart. I did palpate his pulse this morning and he feels regular and rates feel well controlled. The patient states he typically has nasal congestion, but this feels worse for him over the last few days. He is not visibly short of breath in the office today and shows no visible signs of distress. I advised the patient it seems as though with his worsening nasal congestion, this may be the source of this problems. He has flonase at home that he has not used. He will try this to see if symptoms will improve. I have advised him to all back if symptoms worsen or if his heart starts to feel any different. He is agreeable and very appreciative.

## 2015-09-29 ENCOUNTER — Telehealth: Payer: Self-pay | Admitting: Pharmacist Clinician (PhC)/ Clinical Pharmacy Specialist

## 2015-09-29 NOTE — Telephone Encounter (Signed)
Patient called office of Dr. Percival Spanish. Requesting information on what to do for coumadin and whether the lovenox would be needed. Should have this followed by his regular coumadin clinic.  Per Dr. Rosezella [place] notes 08/05/15, pt cleared for the upcoming surgery. "Right inguinal hernia, followed by Dr. Zella Richer: The patient has no high-risk findings currently. From my standpoint no further testing would be necessary prior to surgical repair. Based on ACC/AHA guidelines, the patient would be at acceptable risk for the planned procedure without further cardiovascular testing."

## 2015-09-29 NOTE — Telephone Encounter (Signed)
Surgery on June 12th.  His surgeon told him to stop 5 days.  He was instructed that he does not need lovenox by surgeon.

## 2015-10-05 ENCOUNTER — Telehealth: Payer: Self-pay | Admitting: Cardiology

## 2015-10-05 NOTE — Telephone Encounter (Signed)
Spoke to patient Patient wanted to confirm that he did not needed LOVENOX injection for upcoming surgery.  Per last office note 08/05/15 , Dr Hochrein's note - patient does not need bridging- may stop warfarin as prescribed by surgeon.  Information given to patient.  Patient verbalized understanding.

## 2015-10-05 NOTE — Telephone Encounter (Signed)
New message     The pt is calling about the blood thinners   Pt c/o medication issue:  1. Name of Medication: Lobenex the pt states it is a blood thinner  2. How are you currently taking this medication (dosage and times per day)? uncertain  3. Are you having a reaction (difficulty breathing--STAT)? no  4. What is your medication issue? The pt states he was suppose to come by the office and pick-up samples    Patient calling the office for samples of medication:   1.  What medication and dosage are you requesting samples for? Lobenex the pt states it is a shot  2.  Are you currently out of this medication? Not yet

## 2015-10-06 ENCOUNTER — Encounter: Payer: Self-pay | Admitting: Family

## 2015-10-06 ENCOUNTER — Ambulatory Visit (INDEPENDENT_AMBULATORY_CARE_PROVIDER_SITE_OTHER): Payer: Medicare Other | Admitting: Family

## 2015-10-06 VITALS — BP 138/86 | HR 75 | Temp 96.8°F | Ht 71.0 in | Wt 182.4 lb

## 2015-10-06 DIAGNOSIS — M545 Low back pain: Secondary | ICD-10-CM | POA: Diagnosis not present

## 2015-10-06 DIAGNOSIS — S134XXS Sprain of ligaments of cervical spine, sequela: Secondary | ICD-10-CM

## 2015-10-06 DIAGNOSIS — S139XXS Sprain of joints and ligaments of unspecified parts of neck, sequela: Secondary | ICD-10-CM

## 2015-10-06 MED ORDER — CYCLOBENZAPRINE HCL 5 MG PO TABS: 5.0000 mg | ORAL_TABLET | Freq: Three times a day (TID) | ORAL | Status: DC | PRN

## 2015-10-06 NOTE — Patient Instructions (Addendum)
Cervical Sprain A cervical sprain is an injury in the neck in which the strong, fibrous tissues (ligaments) that connect your neck bones stretch or tear. Cervical sprains can range from mild to severe. Severe cervical sprains can cause the neck vertebrae to be unstable. This can lead to damage of the spinal cord and can result in serious nervous system problems. The amount of time it takes for a cervical sprain to get better depends on the cause and extent of the injury. Most cervical sprains heal in 1 to 3 weeks. CAUSES  Severe cervical sprains may be caused by:   Contact sport injuries (such as from football, rugby, wrestling, hockey, auto racing, gymnastics, diving, martial arts, or boxing).   Motor vehicle collisions.   Whiplash injuries. This is an injury from a sudden forward and backward whipping movement of the head and neck.  Falls.  Mild cervical sprains may be caused by:   Being in an awkward position, such as while cradling a telephone between your ear and shoulder.   Sitting in a chair that does not offer proper support.   Working at a poorly Landscape architect station.   Looking up or down for long periods of time.  SYMPTOMS   Pain, soreness, stiffness, or a burning sensation in the front, back, or sides of the neck. This discomfort may develop immediately after the injury or slowly, 24 hours or more after the injury.   Pain or tenderness directly in the middle of the back of the neck.   Shoulder or upper back pain.   Limited ability to move the neck.   Headache.   Dizziness.   Weakness, numbness, or tingling in the hands or arms.   Muscle spasms.   Difficulty swallowing or chewing.   Tenderness and swelling of the neck.  DIAGNOSIS  Most of the time your health care provider can diagnose a cervical sprain by taking your history and doing a physical exam. Your health care provider will ask about previous neck injuries and any known neck  problems, such as arthritis in the neck. X-rays may be taken to find out if there are any other problems, such as with the bones of the neck. Other tests, such as a CT scan or MRI, may also be needed.  TREATMENT  Treatment depends on the severity of the cervical sprain. Mild sprains can be treated with rest, keeping the neck in place (immobilization), and pain medicines. Severe cervical sprains are immediately immobilized. Further treatment is done to help with pain, muscle spasms, and other symptoms and may include:  Medicines, such as pain relievers, numbing medicines, or muscle relaxants.   Physical therapy. This may involve stretching exercises, strengthening exercises, and posture training. Exercises and improved posture can help stabilize the neck, strengthen muscles, and help stop symptoms from returning.  HOME CARE INSTRUCTIONS   Put ice on the injured area.   Put ice in a plastic bag.   Place a towel between your skin and the bag.   Leave the ice on for 15-20 minutes, 3-4 times a day.   If your injury was severe, you may have been given a cervical collar to wear. A cervical collar is a two-piece collar designed to keep your neck from moving while it heals.  Do not remove the collar unless instructed by your health care provider.  If you have long hair, keep it outside of the collar.  Ask your health care provider before making any adjustments to your collar. Minor  adjustments may be required over time to improve comfort and reduce pressure on your chin or on the back of your head.  Ifyou are allowed to remove the collar for cleaning or bathing, follow your health care provider's instructions on how to do so safely.  Keep your collar clean by wiping it with mild soap and water and drying it completely. If the collar you have been given includes removable pads, remove them every 1-2 days and hand wash them with soap and water. Allow them to air dry. They should be completely  dry before you wear them in the collar.  If you are allowed to remove the collar for cleaning and bathing, wash and dry the skin of your neck. Check your skin for irritation or sores. If you see any, tell your health care provider.  Do not drive while wearing the collar.   Only take over-the-counter or prescription medicines for pain, discomfort, or fever as directed by your health care provider.   Keep all follow-up appointments as directed by your health care provider.   Keep all physical therapy appointments as directed by your health care provider.   Make any needed adjustments to your workstation to promote good posture.   Avoid positions and activities that make your symptoms worse.   Warm up and stretch before being active to help prevent problems.  SEEK MEDICAL CARE IF:   Your pain is not controlled with medicine.   You are unable to decrease your pain medicine over time as planned.   Your activity level is not improving as expected.  SEEK IMMEDIATE MEDICAL CARE IF:   You develop any bleeding.  You develop stomach upset.  You have signs of an allergic reaction to your medicine.   Your symptoms get worse.   You develop new, unexplained symptoms.   You have numbness, tingling, weakness, or paralysis in any part of your body.  MAKE SURE YOU:   Understand these instructions.  Will watch your condition.  Will get help right away if you are not doing well or get worse.   This information is not intended to replace advice given to you by your health care provider. Make sure you discuss any questions you have with your health care provider.   Document Released: 02/13/2007 Document Revised: 04/23/2013 Document Reviewed: 10/24/2012 Elsevier Interactive Patient Education 2016 Elsevier Inc. Back Pain, Adult Back pain is very common in adults.The cause of back pain is rarely dangerous and the pain often gets better over time.The cause of your back pain  may not be known. Some common causes of back pain include:  Strain of the muscles or ligaments supporting the spine.  Wear and tear (degeneration) of the spinal disks.  Arthritis.  Direct injury to the back. For many people, back pain may return. Since back pain is rarely dangerous, most people can learn to manage this condition on their own. HOME CARE INSTRUCTIONS Watch your back pain for any changes. The following actions may help to lessen any discomfort you are feeling:  Remain active. It is stressful on your back to sit or stand in one place for long periods of time. Do not sit, drive, or stand in one place for more than 30 minutes at a time. Take short walks on even surfaces as soon as you are able.Try to increase the length of time you walk each day.  Exercise regularly as directed by your health care provider. Exercise helps your back heal faster. It also helps avoid  future injury by keeping your muscles strong and flexible.  Do not stay in bed.Resting more than 1-2 days can delay your recovery.  Pay attention to your body when you bend and lift. The most comfortable positions are those that put less stress on your recovering back. Always use proper lifting techniques, including:  Bending your knees.  Keeping the load close to your body.  Avoiding twisting.  Find a comfortable position to sleep. Use a firm mattress and lie on your side with your knees slightly bent. If you lie on your back, put a pillow under your knees.  Avoid feeling anxious or stressed.Stress increases muscle tension and can worsen back pain.It is important to recognize when you are anxious or stressed and learn ways to manage it, such as with exercise.  Take medicines only as directed by your health care provider. Over-the-counter medicines to reduce pain and inflammation are often the most helpful.Your health care provider may prescribe muscle relaxant drugs.These medicines help dull your pain so you  can more quickly return to your normal activities and healthy exercise.  Apply ice to the injured area:  Put ice in a plastic bag.  Place a towel between your skin and the bag.  Leave the ice on for 20 minutes, 2-3 times a day for the first 2-3 days. After that, ice and heat may be alternated to reduce pain and spasms.  Maintain a healthy weight. Excess weight puts extra stress on your back and makes it difficult to maintain good posture. SEEK MEDICAL CARE IF:  You have pain that is not relieved with rest or medicine.  You have increasing pain going down into the legs or buttocks.  You have pain that does not improve in one week.  You have night pain.  You lose weight.  You have a fever or chills. SEEK IMMEDIATE MEDICAL CARE IF:   You develop new bowel or bladder control problems.  You have unusual weakness or numbness in your arms or legs.  You develop nausea or vomiting.  You develop abdominal pain.  You feel faint.   This information is not intended to replace advice given to you by your health care provider. Make sure you discuss any questions you have with your health care provider.   Document Released: 04/18/2005 Document Revised: 05/09/2014 Document Reviewed: 08/20/2013 Elsevier Interactive Patient Education [employer].

## 2015-10-06 NOTE — Addendum Note (Signed)
Addended by: Evelina Dun A on: 10/06/2015 12:56 PM   Modules accepted: Orders
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
## 2015-10-07 ENCOUNTER — Encounter (HOSPITAL_COMMUNITY): Payer: Self-pay

## 2015-10-07 ENCOUNTER — Encounter (HOSPITAL_COMMUNITY)
Admission: RE | Admit: 2015-10-07 | Discharge: 2015-10-07 | Disposition: A | Discharge status: Home / Self Care | Payer: Medicare Other | Source: Ambulatory Visit | Attending: General Surgery | Admitting: General Surgery

## 2015-10-07 ENCOUNTER — Other Ambulatory Visit (HOSPITAL_COMMUNITY): Payer: Self-pay | Admitting: *Deleted

## 2015-10-07 DIAGNOSIS — Z79899 Other long term (current) drug therapy: Secondary | ICD-10-CM | POA: Insufficient documentation

## 2015-10-07 DIAGNOSIS — I255 Ischemic cardiomyopathy: Secondary | ICD-10-CM | POA: Insufficient documentation

## 2015-10-07 DIAGNOSIS — I442 Atrioventricular block, complete: Secondary | ICD-10-CM | POA: Diagnosis not present

## 2015-10-07 DIAGNOSIS — K4090 Unilateral inguinal hernia, without obstruction or gangrene, not specified as recurrent: Secondary | ICD-10-CM | POA: Insufficient documentation

## 2015-10-07 DIAGNOSIS — Z01818 Encounter for other preprocedural examination: Secondary | ICD-10-CM | POA: Diagnosis not present

## 2015-10-07 DIAGNOSIS — Z95810 Presence of automatic (implantable) cardiac defibrillator: Secondary | ICD-10-CM | POA: Insufficient documentation

## 2015-10-07 DIAGNOSIS — Z7901 Long term (current) use of anticoagulants: Secondary | ICD-10-CM | POA: Diagnosis not present

## 2015-10-07 DIAGNOSIS — Z01812 Encounter for preprocedural laboratory examination: Secondary | ICD-10-CM | POA: Diagnosis not present

## 2015-10-07 DIAGNOSIS — I482 Chronic atrial fibrillation: Secondary | ICD-10-CM | POA: Insufficient documentation

## 2015-10-07 DIAGNOSIS — I10 Essential (primary) hypertension: Secondary | ICD-10-CM | POA: Insufficient documentation

## 2015-10-07 DIAGNOSIS — E785 Hyperlipidemia, unspecified: Secondary | ICD-10-CM | POA: Insufficient documentation

## 2015-10-07 DIAGNOSIS — Z87891 Personal history of nicotine dependence: Secondary | ICD-10-CM | POA: Insufficient documentation

## 2015-10-07 DIAGNOSIS — I2510 Atherosclerotic heart disease of native coronary artery without angina pectoris: Secondary | ICD-10-CM | POA: Diagnosis not present

## 2015-10-07 DIAGNOSIS — Z951 Presence of aortocoronary bypass graft: Secondary | ICD-10-CM | POA: Diagnosis not present

## 2015-10-07 LAB — COMPREHENSIVE METABOLIC PANEL
ALBUMIN: 3.9 g/dL (ref 3.5–5.0)
ALK PHOS: 90 U/L (ref 38–126)
ALT: 21 U/L (ref 17–63)
AST: 28 U/L (ref 15–41)
Anion gap: 6 (ref 5–15)
BILIRUBIN TOTAL: 1.4 mg/dL — AB (ref 0.3–1.2)
BUN: 12 mg/dL (ref 6–20)
CALCIUM: 9.2 mg/dL (ref 8.9–10.3)
CO2: 29 mmol/L (ref 22–32)
Chloride: 96 mmol/L — ABNORMAL LOW (ref 101–111)
Creatinine, Ser: 1.05 mg/dL (ref 0.61–1.24)
GFR calc Af Amer: >60 mL/min (ref >60)
GFR calc non Af Amer: >60 mL/min (ref >60)
GLUCOSE: 90 mg/dL (ref 65–99)
POTASSIUM: 5 mmol/L (ref 3.5–5.1)
SODIUM: 131 mmol/L — AB (ref 135–145)
TOTAL PROTEIN: 6.8 g/dL (ref 6.5–8.1)

## 2015-10-07 LAB — CBC
HCT: 41.6 % (ref 39.0–52.0)
HEMOGLOBIN: 13.6 g/dL (ref 13.0–17.0)
MCH: 29.2 pg (ref 26.0–34.0)
MCHC: 32.7 g/dL (ref 30.0–36.0)
MCV: 89.3 fL (ref 78.0–100.0)
Platelets: 145 10*3/uL — ABNORMAL LOW (ref 150–400)
RBC: 4.66 MIL/uL (ref 4.22–5.81)
RDW: 14.1 % (ref 11.5–15.5)
WBC: 5.1 10*3/uL (ref 4.0–10.5)

## 2015-10-07 LAB — PROTIME-INR
INR: 2.6 — ABNORMAL HIGH (ref 0.00–1.49)
Prothrombin Time: 27.4 seconds — ABNORMAL HIGH (ref 11.6–15.2)

## 2015-10-07 NOTE — Telephone Encounter (Signed)
Patient called today to verify plan to hold warfarin prior to his surgery.  He is to stop warfarin starting with today's dose and hold until after surgery which is scheduled for 10/13/2015.

## 2015-10-07 NOTE — Pre-Procedure Instructions (Signed)
    [name]  10/07/2015      [facility] - [place], [place] - [place] [address] Phone: [phone] Fax: [phone] [place], [place] [address] [place] [address] Phone: [phone] Fax: [phone]  [facility] ([place]) [place], [place] [address] Phone: [phone] Fax: [phone]  [id number] - [place], [place] [address] Phone: [phone] Fax: [place], [place] [address] Phone: [phone] Fax: [place], [place] [address] Phone: [phone] Fax: [phone]     Your procedure is scheduled on Monday June 12th  Report to [facility] Admitting at 9:45 am  Call this number if you have problems the morning of surgery:  [phone]  If questions prior to surgery you may call [phone] M-F between 8 and 4pm   Remember:  Do not eat food or drink liquids after midnight.  Take these medicines the morning of surgery with A SIP OF WATER: Tylenol if needed, Xanax if needed, carvedilol, flexeril if needed, flonase, gabapentin, isosorbide, claritin, omeprazole Stop taking your warfarin as directed by your doctor. Stop taking all aspirin or aspirin containing products, Nsaids (such as aleve, motrin, ibuprofen, naproxen, advil), vitamins and herbal supplements   Do not wear jewelry.  Do not wear lotions, powders, or cologne.  You may wear deodorant.  Men may shave face and neck.  Do not bring valuables to the hospital.  [facility] is not responsible for any belongings or valuables.  Contacts, dentures or bridgework  may not be worn into surgery.    Patients discharged the day of surgery will not be allowed to drive home.   Special instructions:  Shower with CHG the night before and morning of surgery as instructed  Please read over the following fact sheets that you were given. Coughing and Deep Breathing and Surgical Site Infection Prevention, Showering instructions

## 2015-10-07 NOTE — Progress Notes (Addendum)
Pt has Warehouse manager.  Spoke with [place] and informed her of surgery, time and date.  She states they will have someone available if they are needed.  OR desk also notified. Device programming order from faxed to the device clinic with Dr Caryl Comes.  Cardiologist Dr Percival Spanish. Last seen 08/04/15  Pt was instructed to stop his Coumadin 5 days prior to surgery. He stopped taking it last night. PCP Dr [facility])  Stress test 06/12/14 Echo 04/28/15 Heart cath 04/28/15 EKG 09/15/15 CXR 06/25/15

## 2015-10-08 ENCOUNTER — Telehealth: Payer: Self-pay

## 2015-10-08 NOTE — Progress Notes (Signed)
Anesthesia Chart Review:  Pt is a 75 year old male scheduled for R inguinal hernia repair with mesh on 10/12/2015 with Dr. Zella Richer.   EP cardiologist is Dr. Virl Axe who has cleared pt for surgery. Cardiologist is Dr. Minus Breeding who has cleared pt for surgery.   PMH includes:  CAD (s/p CABG x4 1991; redo CABG 2007), ischemic cardiomyopathy, complete heart block (due to ablation for atrial flutter), CRT-D implanted 06/2015, CHF, permanent atrial fibrillation, atrial flutter, HTN, hyperlipidemia. Former smoker. BMI 26  Medications include: carvedilol, lasix, imdur, lisinopril, prilosec, crestor, coumadin. Last dose coumadin 10/06/15.   Preoperative labs reviewed.  PT 27.4. Will recheck DOS.   Chest x-ray 06/25/15 reviewed.  1. AICD in stable position. Prior CABG. Stable cardiomegaly. No CHF. 2. Basilar pleural-parenchymal scarring.  EKG 09/15/15: ventricular paced rhythm.   Cardiac cath 04/28/15:  1. Ramus lesion, 100% stenosed. 2. Prox RCA lesion, 100% stenosed. 3. Ost Cx to Prox Cx lesion, 100% stenosed. 4. 1st Diag lesion, 100% stenosed. 5. Mid LAD to Dist LAD lesion, 100% stenosed. 6. SVG is normal in caliber, and is anatomically normal. 7. Mid RCA to Dist RCA lesion, 100% stenosed. 8. was injected is normal in caliber, and is anatomically normal. 9. LIMA was injected is normal in caliber, and is anatomically normal.   Severe native vessel coronary disease with total occlusion of the proximal RCA, proximal circumflex, and proximal LAD.  Widely patent sequential saphenous vein graft to the distal RCA and continuation of the right coronary, widely patent saphenous vein graft to the ramus intermedius, and widely patent LIMA to LAD.  The native ramus intermedius supplies collaterals to an acute marginal branch of the right coronary. The native distal right coronary supplies collaterals to the circumflex/marginal system.  Left ventriculography was not performed. Pressures were  recorded. LVEF was assessed by echocardiography on this admission.  Overall, no change in coronary anatomy since the prior coronary angiogram 2012.  Echo 04/27/15:  - Left ventricle: The cavity size was normal. Wall thickness was increased in a pattern of mild LVH. Systolic function was moderately to severely reduced. The estimated ejection fraction was in the range of 30% to 35%. There is akinesis of the inferoseptal myocardium. Doppler parameters are consistent with a reversible restrictive pattern, indicative of decreased left ventricular diastolic compliance and/or increased left atrial pressure (grade 3 diastolic dysfunction). - Aortic valve: Trileaflet; mildly thickened, mildly calcified leaflets. - Mitral valve: Moderately calcified annulus. There was mild regurgitation. - Left atrium: The atrium was moderately dilated. - Right ventricle: Pacer wire or catheter noted in right ventricle. - Right atrium: The atrium was moderately dilated. - Impressions: Compared to the prior study, there has been no significant interval change.  If PT acceptable DOS, I anticipate pt can proceed with surgery as scheduled.   Willeen Cass, FNP-BC [facility]iology Phone: [phone] 10/08/2015 3:01 PM

## 2015-10-08 NOTE — Telephone Encounter (Signed)
Insurance authorized Cyclobenzaprine HCL

## 2015-10-09 NOTE — Progress Notes (Signed)
Left message at  [facility] requesting ICD form be filled out by Dr. Caryl Comes and faxed to [phone].

## 2015-10-11 MED ORDER — CEFAZOLIN SODIUM-DEXTROSE 2-4 GM/100ML-% IV SOLN
2.0000 g | INTRAVENOUS | Status: AC
  Filled 2015-10-11: qty 100

## 2015-10-11 NOTE — Anesthesia Preprocedure Evaluation (Addendum)
Anesthesia Evaluation  Patient identified by MRN, date of birth, ID band Patient awake    Reviewed: Allergy & Precautions, NPO status , Patient's Chart, lab work & pertinent test results, reviewed documented beta blocker date and time   Airway Mallampati: II  TM Distance: >3 FB Neck ROM: Full    Dental  (+) Dental Advisory Given   Pulmonary former smoker,    breath sounds clear to auscultation       Cardiovascular hypertension, Pt. on medications and Pt. on home beta blockers + CAD, + CABG and +CHF  + dysrhythmias + Cardiac Defibrillator  Rhythm:Regular Rate:Normal  Echo 04/27/15:  - Left ventricle: The cavity size was normal. Wall thickness was increased in a pattern of mild LVH. Systolic function was moderately to severely reduced. The estimated ejection fraction was in the range of 30% to 35%. There is akinesis of the inferoseptal myocardium. Doppler parameters are consistent with a reversible restrictive pattern, indicative of decreased left ventricular diastolic compliance and/or increased left atrial pressure (grade 3 diastolic dysfunction). - Aortic valve: Trileaflet; mildly thickened, mildly calcified leaflets. - Mitral valve: Moderately calcified annulus. There was mild regurgitation. - Left atrium: The atrium was moderately dilated. - Right ventricle: Pacer wire or catheter noted in right ventricle. - Right atrium: The atrium was moderately dilated. - Impressions: Compared to the prior study, there has been no significant interval change.   Neuro/Psych Anxiety Depression negative neurological ROS     GI/Hepatic Neg liver ROS, hiatal hernia, GERD  ,  Endo/Other  negative endocrine ROS  Renal/GU negative Renal ROS     Musculoskeletal  (+) Fibromyalgia -  Abdominal   Peds  Hematology negative hematology ROS (+)   Anesthesia Other Findings   Reproductive/Obstetrics                             Lab Results  Component Value Date   WBC 5.1 10/07/2015   HGB 13.6 10/07/2015   HCT 41.6 10/07/2015   MCV 89.3 10/07/2015   PLT 145* 10/07/2015   Lab Results  Component Value Date   CREATININE 1.05 10/07/2015   BUN 12 10/07/2015   NA 131* 10/07/2015   K 5.0 10/07/2015   CL 96* 10/07/2015   CO2 29 10/07/2015   Lab Results  Component Value Date   INR 1.34 10/12/2015   INR 2.60* 10/07/2015   INR 2.9* 09/08/2015    Anesthesia Physical Anesthesia Plan  ASA: III  Anesthesia Plan: General and Regional   Post-op Pain Management: GA combined w/ Regional for post-op pain   Induction: Intravenous  Airway Management Planned: LMA and Oral ETT  Additional Equipment:   Intra-op Plan:   Post-operative Plan: Extubation in OR  Informed Consent: I have reviewed the patients History and Physical, chart, labs and discussed the procedure including the risks, benefits and alternatives for the proposed anesthesia with the patient or authorized representative who has indicated his/her understanding and acceptance.   Dental advisory given  Plan Discussed with: CRNA  Anesthesia Plan Comments:        Anesthesia Quick Evaluation

## 2015-10-12 ENCOUNTER — Ambulatory Visit (HOSPITAL_COMMUNITY)
Admission: RE | Admit: 2015-10-12 | Discharge: 2015-10-12 | Disposition: A | Discharge status: Home / Self Care | Payer: Medicare Other | Source: Ambulatory Visit | Attending: General Surgery | Admitting: General Surgery

## 2015-10-12 ENCOUNTER — Encounter (HOSPITAL_COMMUNITY): Payer: Self-pay | Admitting: Anesthesiology

## 2015-10-12 ENCOUNTER — Ambulatory Visit (HOSPITAL_COMMUNITY): Payer: Medicare Other | Admitting: Emergency Medicine

## 2015-10-12 ENCOUNTER — Encounter (HOSPITAL_COMMUNITY)
Admission: RE | Disposition: A | Discharge status: Home / Self Care | Payer: Self-pay | Source: Ambulatory Visit | Attending: General Surgery

## 2015-10-12 DIAGNOSIS — Z87891 Personal history of nicotine dependence: Secondary | ICD-10-CM | POA: Insufficient documentation

## 2015-10-12 DIAGNOSIS — I110 Hypertensive heart disease with heart failure: Secondary | ICD-10-CM | POA: Insufficient documentation

## 2015-10-12 DIAGNOSIS — K219 Gastro-esophageal reflux disease without esophagitis: Secondary | ICD-10-CM | POA: Diagnosis not present

## 2015-10-12 DIAGNOSIS — Z95810 Presence of automatic (implantable) cardiac defibrillator: Secondary | ICD-10-CM | POA: Diagnosis not present

## 2015-10-12 DIAGNOSIS — Z951 Presence of aortocoronary bypass graft: Secondary | ICD-10-CM | POA: Insufficient documentation

## 2015-10-12 DIAGNOSIS — I2510 Atherosclerotic heart disease of native coronary artery without angina pectoris: Secondary | ICD-10-CM | POA: Diagnosis not present

## 2015-10-12 DIAGNOSIS — I509 Heart failure, unspecified: Secondary | ICD-10-CM | POA: Diagnosis not present

## 2015-10-12 DIAGNOSIS — Z7951 Long term (current) use of inhaled steroids: Secondary | ICD-10-CM | POA: Diagnosis not present

## 2015-10-12 DIAGNOSIS — Z79899 Other long term (current) drug therapy: Secondary | ICD-10-CM | POA: Insufficient documentation

## 2015-10-12 DIAGNOSIS — K4090 Unilateral inguinal hernia, without obstruction or gangrene, not specified as recurrent: Secondary | ICD-10-CM | POA: Diagnosis present

## 2015-10-12 DIAGNOSIS — M797 Fibromyalgia: Secondary | ICD-10-CM | POA: Diagnosis not present

## 2015-10-12 DIAGNOSIS — F418 Other specified anxiety disorders: Secondary | ICD-10-CM | POA: Diagnosis not present

## 2015-10-12 HISTORY — PX: INGUINAL HERNIA REPAIR: SHX194

## 2015-10-12 HISTORY — PX: INSERTION OF MESH: SHX5868

## 2015-10-12 LAB — PROTIME-INR
INR: 1.34 (ref 0.00–1.49)
Prothrombin Time: 16.7 seconds — ABNORMAL HIGH (ref 11.6–15.2)

## 2015-10-12 SURGERY — REPAIR, HERNIA, INGUINAL, ADULT
Anesthesia: Regional | Site: Groin | Laterality: Right

## 2015-10-12 MED ORDER — LACTATED RINGERS IV SOLN
INTRAVENOUS | Status: DC | PRN
  Administered 2015-10-12: 07:00:00 via INTRAVENOUS

## 2015-10-12 MED ORDER — SUCCINYLCHOLINE CHLORIDE 200 MG/10ML IV SOSY
PREFILLED_SYRINGE | INTRAVENOUS | Status: AC
  Filled 2015-10-12: qty 10

## 2015-10-12 MED ORDER — FENTANYL CITRATE (PF) 100 MCG/2ML IJ SOLN
25.0000 ug | INTRAMUSCULAR | Status: DC | PRN
  Administered 2015-10-12: 25 ug via INTRAVENOUS

## 2015-10-12 MED ORDER — ACETAMINOPHEN 325 MG PO TABS
650.0000 mg | ORAL_TABLET | ORAL | Status: DC | PRN
Start: 2015-10-12 — End: 2015-10-12

## 2015-10-12 MED ORDER — ONDANSETRON HCL 4 MG/2ML IJ SOLN
INTRAMUSCULAR | Status: AC
  Filled 2015-10-12: qty 2

## 2015-10-12 MED ORDER — FENTANYL CITRATE (PF) 100 MCG/2ML IJ SOLN
INTRAMUSCULAR | Status: DC | PRN
  Administered 2015-10-12: 50 ug via INTRAVENOUS

## 2015-10-12 MED ORDER — CEFAZOLIN SODIUM-DEXTROSE 2-3 GM-% IV SOLR
INTRAVENOUS | Status: DC | PRN
  Administered 2015-10-12: 2 g via INTRAVENOUS

## 2015-10-12 MED ORDER — MIDAZOLAM HCL 2 MG/2ML IJ SOLN
INTRAMUSCULAR | Status: AC
  Filled 2015-10-12: qty 2

## 2015-10-12 MED ORDER — MIDAZOLAM HCL 5 MG/5ML IJ SOLN
INTRAMUSCULAR | Status: DC | PRN
  Administered 2015-10-12 (×2): 1 mg via INTRAVENOUS

## 2015-10-12 MED ORDER — ACETAMINOPHEN 650 MG RE SUPP: 650.0000 mg | RECTAL | Status: DC | PRN

## 2015-10-12 MED ORDER — LIDOCAINE 2% (20 MG/ML) 5 ML SYRINGE
INTRAMUSCULAR | Status: AC
  Filled 2015-10-12: qty 5

## 2015-10-12 MED ORDER — PHENYLEPHRINE HCL 10 MG/ML IJ SOLN
INTRAMUSCULAR | Status: DC | PRN
  Administered 2015-10-12 (×5): 80 ug via INTRAVENOUS

## 2015-10-12 MED ORDER — 0.9 % SODIUM CHLORIDE (POUR BTL) OPTIME
TOPICAL | Status: DC | PRN
Start: 2015-10-12 — End: 2015-10-12
  Administered 2015-10-12: 1000 mL

## 2015-10-12 MED ORDER — BUPIVACAINE-EPINEPHRINE (PF) 0.5% -1:200000 IJ SOLN
INTRAMUSCULAR | Status: DC | PRN
  Administered 2015-10-12: 25 mL

## 2015-10-12 MED ORDER — OXYCODONE HCL 5 MG PO TABS: 5.0000 mg | ORAL_TABLET | ORAL | Status: DC | PRN

## 2015-10-12 MED ORDER — PHENYLEPHRINE 40 MCG/ML (10ML) SYRINGE FOR IV PUSH (FOR BLOOD PRESSURE SUPPORT)
PREFILLED_SYRINGE | INTRAVENOUS | Status: AC
  Filled 2015-10-12: qty 10

## 2015-10-12 MED ORDER — SODIUM CHLORIDE 0.9% FLUSH: 3.0000 mL | INTRAVENOUS | Status: DC | PRN

## 2015-10-12 MED ORDER — FENTANYL CITRATE (PF) 100 MCG/2ML IJ SOLN
INTRAMUSCULAR | Status: AC
  Filled 2015-10-12: qty 2

## 2015-10-12 MED ORDER — MORPHINE SULFATE (PF) 2 MG/ML IV SOLN: 2.0000 mg | INTRAVENOUS | Status: DC | PRN

## 2015-10-12 MED ORDER — ETOMIDATE 2 MG/ML IV SOLN
INTRAVENOUS | Status: DC | PRN
  Administered 2015-10-12: 14 mg via INTRAVENOUS

## 2015-10-12 MED ORDER — BUPIVACAINE-EPINEPHRINE 0.5% -1:200000 IJ SOLN
INTRAMUSCULAR | Status: DC | PRN
  Administered 2015-10-12: 30 mL

## 2015-10-12 MED ORDER — ONDANSETRON HCL 4 MG/2ML IJ SOLN
INTRAMUSCULAR | Status: DC | PRN
  Administered 2015-10-12: 4 mg via INTRAVENOUS

## 2015-10-12 MED ORDER — OXYCODONE HCL 5 MG PO TABS
ORAL_TABLET | ORAL | Status: DC
Start: 2015-10-12 — End: 2015-10-12
  Filled 2015-10-12: qty 2

## 2015-10-12 MED ORDER — ETOMIDATE 2 MG/ML IV SOLN
INTRAVENOUS | Status: AC
  Filled 2015-10-12: qty 10

## 2015-10-12 MED ORDER — LIDOCAINE HCL (CARDIAC) 20 MG/ML IV SOLN
INTRAVENOUS | Status: DC | PRN
  Administered 2015-10-12: 20 mg via INTRAVENOUS

## 2015-10-12 MED ORDER — FENTANYL CITRATE (PF) 250 MCG/5ML IJ SOLN
INTRAMUSCULAR | Status: AC
  Filled 2015-10-12: qty 5

## 2015-10-12 MED ORDER — EPHEDRINE 5 MG/ML INJ
INTRAVENOUS | Status: AC
  Filled 2015-10-12: qty 10

## 2015-10-12 MED ORDER — PROMETHAZINE HCL 25 MG/ML IJ SOLN
6.2500 mg | INTRAMUSCULAR | Status: DC | PRN
Start: 2015-10-12 — End: 2015-10-12

## 2015-10-12 MED ORDER — PHENYLEPHRINE HCL 10 MG/ML IJ SOLN
10.0000 mg | INTRAVENOUS | Status: DC | PRN
  Administered 2015-10-12: 30 ug/min via INTRAVENOUS

## 2015-10-12 MED ORDER — OXYCODONE HCL 5 MG PO TABS
5.0000 mg | ORAL_TABLET | ORAL | Status: DC | PRN
  Administered 2015-10-12: 10 mg via ORAL

## 2015-10-12 SURGICAL SUPPLY — 47 items
APL SKNCLS STERI-STRIP NONHPOA (GAUZE/BANDAGES/DRESSINGS) ×1
BENZOIN TINCTURE PRP APPL 2/3 (GAUZE/BANDAGES/DRESSINGS) ×3 IMPLANT
BLADE SURG 10 STRL SS (BLADE) ×3 IMPLANT
BLADE SURG 15 STRL LF DISP TIS (BLADE) ×1 IMPLANT
BLADE SURG 15 STRL SS (BLADE) ×3
BLADE SURG ROTATE 9660 (MISCELLANEOUS) ×3 IMPLANT
CHLORAPREP W/TINT 26ML (MISCELLANEOUS) ×3 IMPLANT
CLOSURE WOUND 1/2 X4 (GAUZE/BANDAGES/DRESSINGS) ×1
COVER SURGICAL LIGHT HANDLE (MISCELLANEOUS) ×3 IMPLANT
DRAIN PENROSE 1/2X12 LTX STRL (WOUND CARE) ×3 IMPLANT
DRAPE INCISE IOBAN 66X45 STRL (DRAPES) ×3 IMPLANT
DRAPE LAPAROTOMY TRNSV 102X78 (DRAPE) ×3 IMPLANT
DRAPE UTILITY XL STRL (DRAPES) ×3 IMPLANT
DRSG TEGADERM 4X4.75 (GAUZE/BANDAGES/DRESSINGS) ×3 IMPLANT
DRSG TELFA 3X8 NADH (GAUZE/BANDAGES/DRESSINGS) ×3 IMPLANT
ELECT CAUTERY BLADE 6.4 (BLADE) ×3 IMPLANT
ELECT REM PT RETURN 9FT ADLT (ELECTROSURGICAL) ×3
ELECTRODE REM PT RTRN 9FT ADLT (ELECTROSURGICAL) ×1 IMPLANT
GAUZE SPONGE 4X4 16PLY XRAY LF (GAUZE/BANDAGES/DRESSINGS) ×3 IMPLANT
GLOVE BIOGEL PI IND STRL 7.0 (GLOVE) ×1 IMPLANT
GLOVE BIOGEL PI IND STRL 8 (GLOVE) ×1 IMPLANT
GLOVE BIOGEL PI INDICATOR 7.0 (GLOVE) ×2
GLOVE BIOGEL PI INDICATOR 8 (GLOVE) ×2
GLOVE ECLIPSE 8.0 STRL XLNG CF (GLOVE) ×3 IMPLANT
GOWN STRL REUS W/ TWL LRG LVL3 (GOWN DISPOSABLE) ×1 IMPLANT
GOWN STRL REUS W/TWL LRG LVL3 (GOWN DISPOSABLE) ×3
KIT BASIN OR (CUSTOM PROCEDURE TRAY) ×3 IMPLANT
KIT ROOM TURNOVER OR (KITS) ×3 IMPLANT
MESH HERNIA 3X6 (Mesh General) ×3 IMPLANT
NEEDLE HYPO 25GX1X1/2 BEV (NEEDLE) ×3 IMPLANT
NS IRRIG 1000ML POUR BTL (IV SOLUTION) ×3 IMPLANT
PACK SURGICAL SETUP 50X90 (CUSTOM PROCEDURE TRAY) ×3 IMPLANT
PAD ARMBOARD 7.5X6 YLW CONV (MISCELLANEOUS) ×3 IMPLANT
PENCIL BUTTON HOLSTER BLD 10FT (ELECTRODE) ×3 IMPLANT
SPECIMEN JAR SMALL (MISCELLANEOUS) IMPLANT
SPONGE LAP 18X18 X RAY DECT (DISPOSABLE) ×3 IMPLANT
STRIP CLOSURE SKIN 1/2X4 (GAUZE/BANDAGES/DRESSINGS) ×2 IMPLANT
SUT MON AB 4-0 PC3 18 (SUTURE) ×3 IMPLANT
SUT PROLENE 2 0 CT2 30 (SUTURE) ×6 IMPLANT
SUT SILK 2 0 SH (SUTURE) IMPLANT
SUT VIC AB 2-0 SH 18 (SUTURE) ×3 IMPLANT
SUT VIC AB 3-0 SH 27 (SUTURE) ×6
SUT VIC AB 3-0 SH 27XBRD (SUTURE) ×2 IMPLANT
SUT VICRYL AB 3 0 TIES (SUTURE) ×3 IMPLANT
SYR CONTROL 10ML LL (SYRINGE) ×3 IMPLANT
TOWEL OR 17X24 6PK STRL BLUE (TOWEL DISPOSABLE) IMPLANT
TOWEL OR 17X26 10 PK STRL BLUE (TOWEL DISPOSABLE) ×3 IMPLANT

## 2015-10-12 NOTE — H&P (View-Only) (Signed)
ames D. Kovaleski 09/23/2015 8:33 AM Location: [facility] Patient #: [id number] DOB: Jan 29, 1941 Widowed / Language: Cleophus Molt / Race: White Male  History of Present Illness [name] MD; 09/23/2015 9:20 AM) The patient is a 75 year old male.   Note:Mr. Bucholz returns for preoperative visit. He has seen both Dr. Caryl Comes and Dr. Percival Spanish. They feel it is okay for him to proceed with the hernia repair. He states the hernia is a little larger and its limiting his ability to walk because of discomfort.  Allergies (Sonya Bynum, CMA; 09/23/2015 8:33 AM) Sulfa Antibiotics Ultram *ANALGESICS - OPIOID* Xarelto *ANTICOAGULANTS*  Medication History (Sonya Bynum, CMA; 09/23/2015 8:33 AM) ALPRAZolam (1MG  Tablet, Oral tid) Active. Doxepin HCl (25MG  Capsule, Oral tid) Active. Furosemide (20MG  Tablet, Oral prn) Active. Warfarin Sodium (5MG  Tablet, Oral daily) Active. Loratadine (10MG  Tablet, Oral daily) Active. Isosorbide Mononitrate ER (60MG  Tablet ER 24HR, Oral daily) Active. Omeprazole (20MG  Capsule DR, Oral bid) Active. PreserVision AREDS (2 capsules Oral daily) Active. Nitrostat (0.3MG  Tab Sublingual, Sublingual prn) Active. Lisinopril (5MG  Tablet, Oral daily) Active. (1 tablet eod) Multivitamin (Oral daily) Active. Acetaminophen (500MG  Tablet, Oral tid) Active. Medications Reconciled    Vitals (Sonya Bynum CMA; 09/23/2015 8:33 AM) 09/23/2015 8:33 AM Weight: 184 lb Height: 69in Body Surface Area: 1.99 m Body Mass Index: 27.17 kg/m  Temp.: 61F(Temporal)  Pulse: 79 (Regular)  BP: 130/80 (Sitting, Left Arm, Standard)      Physical Exam [name] MD; 09/23/2015 9:22 AM)  The physical exam findings are as follows: Note:Endo-well-developed, well-nourished elderly male in no acute distress.  Eyes-no icterus  Cardiac regular rate and rhythm  Lungs clear to auscultation.  Abdomen soft with small reducible umbilical  hernia  GU-left inguinal scar. Moderate size right inguinal bulge that is reducible in the supine position.    Assessment & Plan [name] MD; 09/23/2015 9:18 AM)  RIGHT INGUINAL HERNIA (K40.90) Impression: A little larger. He has seen Dr. Percival Spanish and Dr. Caryl Comes who feel it is ok to proceed with the hernia repair.  Plan: Right inguinal hernia repair with mesh. Stop Coumadin 5 days before surgery. I have explained the procedure, risks, and aftercare of inguinal hernia repair. Risks include but are not limited to bleeding, infection, wound problems, anesthesia, recurrence, bladder or intestine injury, urinary retention, testicular dysfunction, chronic pain, mesh problems. He seems to understand and wants to proceed.  Jackolyn Confer, MD

## 2015-10-12 NOTE — Discharge Instructions (Signed)
[facility] _______[facility]   INGUINAL HERNIA REPAIR: POST OP INSTRUCTIONS  Always review your discharge instruction sheet given to you by the facility where your surgery was performed. IF YOU HAVE DISABILITY OR FAMILY LEAVE FORMS, YOU MUST BRING THEM TO THE OFFICE FOR PROCESSING.   DO NOT GIVE THEM TO YOUR DOCTOR.  1. A  prescription for pain medication may be given to you upon discharge.  Take your pain medication as prescribed, if needed.  If narcotic pain medicine is not needed, then you may take acetaminophen (Tylenol) or ibuprofen (Advil) as needed. 2. Take your usually prescribed medications unless otherwise directed. 3. If you need a refill on your pain medication, please contact your pharmacy.  They will contact our office to request authorization. Prescriptions will not be filled after 5 pm or on week-ends. 4. You should follow a light diet the first 24 hours after arrival home, such as soup and crackers, etc.  Be sure to include lots of fluids daily.  Resume your normal diet the day after surgery. 5. Most patients will experience some swelling and bruising around the umbilicus or in the groin and scrotum.  Ice packs and reclining will help.  Swelling and bruising can take several days to resolve.  6. It is common to experience some constipation if taking pain medication after surgery.  Increasing fluid intake and taking a stool softener (such as Colace) will usually help or prevent this problem from occurring.  A mild laxative (Milk of Magnesia or Miralax) should be taken according to package directions if there are no bowel movements after 48 hours. 7. Unless discharge instructions indicate otherwise, you may remove your bandages 72 hours after surgery.  You may shower the day after surgery.  You may have steri-strips (small skin tapes) in place directly over the incision.  These strips should be left on the skin until they fall off.  If your surgeon used skin glue on the  incision, you may shower in 24 hours.  The glue will flake off over the next 2-3 weeks.  Any sutures or staples will be removed at the office during your follow-up visit. 8. ACTIVITIES:  You may resume light daily activities beginning the next day--such as daily self-care, walking, climbing stairs--gradually increasing activities as tolerated. Do not lie flat for the first 2-3 days. You may have sexual intercourse when it is comfortable.  Refrain from any heavy lifting or straining-nothing over 10 pounds for 6 weeks.   a. You may drive when you are no longer taking prescription pain medication, you can comfortably wear a seatbelt, and you can safely maneuver your car and apply brakes. b. RETURN TO WORK:  _Desk type work in one week, full duty in 6 weeks._________________________________________________________ 9. You should see your doctor in the office for a follow-up appointment approximately 2-3 weeks after your surgery.  Make sure that you call for this appointment within a day or two after you arrive home to insure a convenient appointment time. 10. OTHER INSTRUCTIONS:  _____Restart Warfarin 10/15/15.  Have INR checked 10/22/15.____________________________________________________________________________________________________________________________________________________________________________________  WHEN TO CALL YOUR DOCTOR: 1. Fever over 101.0 2. Inability to urinate 3. Nausea and/or vomiting 4. Extreme swelling or bruising 5. Continued bleeding from incision. 6. Increased pain, redness, or drainage from the incision  The clinic staff is available to answer your questions during regular business hours.  Please dont hesitate to call and ask to speak to one of the nurses for clinical concerns.  If you have  a medical emergency, go to the nearest emergency room or call 911.  A surgeon from [facility] is always on call at the hospital   [address] [place],  [place], [place]  [address] ?  [address], [place], [place]   [address] [phone] ? [phone] ? FAX [phone] Web site: [email or web address]

## 2015-10-12 NOTE — Progress Notes (Addendum)
Surgery time was changed and [employer] rep had not been notified.  Paged rep this morning at 0615.  Emergency orders place on chart.  Spoke with Sandi with [employer] at [employer].  She will not be to the hospital until 0730, but states that he should not need any intervention pre-op as is so low.  She will interrogate afterward if needed.

## 2015-10-12 NOTE — Anesthesia Postprocedure Evaluation (Signed)
Anesthesia Post Note  Patient: [name]  Procedure(s) Performed: Procedure(s) (LRB): RIGHT INGUINAL HERNIA REPAIR WITH MESH (Right) INSERTION OF MESH, RIGHT INGUINAL HERNIA (Right)  Patient location during evaluation: PACU Anesthesia Type: General and Regional Level of consciousness: awake and alert Pain management: pain level controlled Vital Signs Assessment: post-procedure vital signs reviewed and stable Respiratory status: spontaneous breathing, nonlabored ventilation, respiratory function stable and patient connected to nasal cannula oxygen Cardiovascular status: blood pressure returned to baseline and stable Postop Assessment: no signs of nausea or vomiting Anesthetic complications: no    Last Vitals:  Filed Vitals:   10/12/15 0900 10/12/15 0915  BP: 116/76 116/81  Pulse: 72 70  Temp: 36.3 C   Resp: 15 13    Last Pain:  Filed Vitals:   10/12/15 0921  PainSc: 5                  Tiajuana Amass

## 2015-10-12 NOTE — Progress Notes (Signed)
Upper dentures returned to patient. Pain free. VSS. Tolerating po fluids well.

## 2015-10-12 NOTE — Op Note (Signed)
OPERATIVE NOTE-INGUINAL HERNIA  Preoperative diagnosis:  Right inguinal hernia.  Postoperative diagnosis:  Same  Procedure:  Right inguinal hernia repair with mesh.  Surgeon:  Jackolyn Confer, M.D.  Anesthesia:  General/LMA, TAP block, local (Marcaine).  Indication:  This is a 75 year old male with a symptomatic right inguinal hernia who presents for repair.  Technique:  He was seen in the holding room and the right groin was marked with my initials. He was brought to the operating, placed supine on the operating table, and the anesthetic was administered by the anesthesiologist. The hair in the groin area was clipped as was felt to be necessary. This area was then sterilely prepped and draped.  Local anesthetic was infiltrated in the superficial and deep tissues in the right groin.  An incision was made through the skin and subcutaneous tissue until the external oblique aponeurosis was identified.  Local anesthetic was infiltrated deep to the external oblique aponeurosis. The external oblique aponeurosis was divided through the external ring medially and back toward the anterior superior iliac spine laterally. Using blunt dissection, the shelving edge of the inguinal ligament was identified inferiorly and the internal oblique aponeurosis and muscle were identified superiorly. The ilioinguinal nerve was identified and preserved.  The spermatic cord was isolated and a posterior window was made around it. An indirect hernia sac was identified and separated from the spermatic cord using blunt dissection. The hernia sac and its contents were reduced through an indirect hernia defect.   A piece of 3" x 6" polypropylene mesh was brought into the field and anchored 1-2 cm medial to the pubic tubercle with 2-0 Prolene suture. The inferior aspect of the mesh was anchored to the shelving edge of the inguinal ligament with running 2-0 Prolene suture to a level 1-2 cm lateral to the internal ring. A slit was  cut in the mesh creating 2 tails. These were wrapped around the spermatic cord. The superior aspect of the mesh was anchored to the internal oblique aponeurosis and muscle with interrupted 2-0 Vicryl sutures. The 2 tails of the mesh were then crossed creating a new internal ring and were anchored to the shelving edge of the inguinal ligament with 2-0 Prolene suture. The tip of a hemostat could be placed through the new aperture. The lateral aspect of the mesh was then tucked deep to the external oblique aponeurosis.  The wound was inspected and hemostasis was adequate. The external oblique aponeurosis was then closed over the mesh and cord with running 3-0 Vicryl suture. The subcutaneous tissue was closed with running 3-0 Vicryl suture. The skin closed with a running 4-0 Monocryl subcuticular stitch.  Steri-Strips and a sterile dressing were applied.  The procedure was well-tolerated without any apparent complications and he was taken to the recovery room in satisfactory condition.

## 2015-10-12 NOTE — Transfer of Care (Signed)
Immediate Anesthesia Transfer of Care Note  Patient: [name]  Procedure(s) Performed: Procedure(s): RIGHT INGUINAL HERNIA REPAIR WITH MESH (Right) INSERTION OF MESH, RIGHT INGUINAL HERNIA (Right)  Patient Location: PACU  Anesthesia Type:General  Level of Consciousness: awake, alert  and oriented  Airway & Oxygen Therapy: Patient Spontanous Breathing and Patient connected to nasal cannula oxygen  Post-op Assessment: Report given to RN and Post -op Vital signs reviewed and stable  Post vital signs: Reviewed and stable  Last Vitals:  Filed Vitals:   10/12/15 0558 10/12/15 0853  BP: 127/77   Pulse: 73 69  Temp: 36.6 C   Resp: 20 9    Last Pain:  Filed Vitals:   10/12/15 0859  PainSc: 2       Patients Stated Pain Goal: 3 ([id number])  Complications: No apparent anesthesia complications

## 2015-10-12 NOTE — Anesthesia Procedure Notes (Addendum)
Anesthesia Regional Block:  TAP block  Pre-Anesthetic Checklist: ,, timeout performed, Correct Patient, Correct Site, Correct Laterality, Correct Procedure, Correct Position, site marked, Risks and benefits discussed,  Surgical consent,  Pre-op evaluation,  At surgeon's request and post-op pain management  Laterality: Right  Prep: chloraprep       Needles:  Injection technique: Single-shot  Needle Type: Echogenic Needle     Needle Length: 9cm 9 cm Needle Gauge: 21 and 21 G    Additional Needles:  Procedures: ultrasound guided (picture in chart) TAP block Narrative:  Start time: 10/12/2015 7:00 AM End time: 10/12/2015 7:10 AM Injection made incrementally with aspirations every 5 mL.  Performed by: Personally  Anesthesiologist: Suzette Battiest   Procedure Name: LMA Insertion Date/Time: 10/12/2015 7:44 AM Performed by: Susa Loffler Pre-anesthesia Checklist: Patient identified, Timeout performed, Emergency Drugs available, Suction available and Patient being monitored Patient Re-evaluated:Patient Re-evaluated prior to inductionOxygen Delivery Method: Circle system utilized Preoxygenation: Pre-oxygenation with 100% oxygen Intubation Type: IV induction Ventilation: Mask ventilation without difficulty LMA: LMA inserted LMA Size: 5.0 Number of attempts: 2 Placement Confirmation: positive ETCO2 and breath sounds checked- equal and bilateral Tube secured with: Tape Dental Injury: Teeth and Oropharynx as per pre-operative assessment  Comments: LMA #4 inserted with large leak despite repositioning; LMA #5 inserted without issue, no leak noted, adequate Vt's. Airway manipulation atraumatic, VSS throughout. KHO

## 2015-10-12 NOTE — Progress Notes (Signed)
Pt up to bathroom & able to void.

## 2015-10-12 NOTE — Interval H&P Note (Signed)
History and Physical Interval Note:  10/12/2015 7:20 AM  [name]  has presented today for surgery, with the diagnosis of Right inguinal hernia   The various methods of treatment have been discussed with the patient and family. After consideration of risks, benefits and other options for treatment, the patient has consented to  Procedure(s): RIGHT INGUINAL HERNIA REPAIR WITH MESH (Right) INSERTION OF MESH (Right) as a surgical intervention .  The patient's history has been reviewed, patient examined, no change in status, stable for surgery.  I have reviewed the patient's chart and labs.  Questions were answered to the patient's satisfaction.     Marte Celani Lenna Sciara

## 2015-10-13 ENCOUNTER — Telehealth: Payer: Self-pay | Admitting: Pharmacist

## 2015-10-13 ENCOUNTER — Encounter (HOSPITAL_COMMUNITY): Payer: Self-pay | Admitting: General Surgery

## 2015-10-13 NOTE — Telephone Encounter (Signed)
Patient to restart warfarin Thursdays, June 15th.  He has hernia surgery today.  Recommended take 7.5mg  for first 2 days, then start usual dose of 5mg  qd except 2.5mg  on saturdays.

## 2015-10-16 ENCOUNTER — Encounter: Payer: Self-pay | Admitting: Pharmacist Clinician (PhC)/ Clinical Pharmacy Specialist

## 2015-10-20 ENCOUNTER — Ambulatory Visit (INDEPENDENT_AMBULATORY_CARE_PROVIDER_SITE_OTHER): Payer: Medicare Other

## 2015-10-20 ENCOUNTER — Ambulatory Visit (INDEPENDENT_AMBULATORY_CARE_PROVIDER_SITE_OTHER): Payer: Medicare Other | Admitting: Family

## 2015-10-20 ENCOUNTER — Encounter: Payer: Self-pay | Admitting: Family

## 2015-10-20 VITALS — BP 136/79 | HR 69 | Temp 96.8°F | Ht 71.0 in | Wt 181.0 lb

## 2015-10-20 DIAGNOSIS — R1031 Right lower quadrant pain: Secondary | ICD-10-CM

## 2015-10-20 DIAGNOSIS — I481 Persistent atrial fibrillation: Secondary | ICD-10-CM

## 2015-10-20 DIAGNOSIS — Z9889 Other specified postprocedural states: Secondary | ICD-10-CM | POA: Diagnosis not present

## 2015-10-20 DIAGNOSIS — I482 Chronic atrial fibrillation, unspecified: Secondary | ICD-10-CM

## 2015-10-20 DIAGNOSIS — Z8719 Personal history of other diseases of the digestive system: Secondary | ICD-10-CM | POA: Diagnosis not present

## 2015-10-20 DIAGNOSIS — I4819 Other persistent atrial fibrillation: Secondary | ICD-10-CM

## 2015-10-20 LAB — COAGUCHEK XS/INR WAIVED
INR: 1.6 — AB (ref 0.9–1.1)
PROTHROMBIN TIME: 18.8 s

## 2015-10-20 NOTE — Progress Notes (Signed)
   Subjective:    Patient ID: [name], male    DOB: 09/27/1940, 75 y.o.   MRN: [id number]  PT presents to the office today with right abd pain tenderness and swelling. Pt has hernia surgery one week ago and was told follow up with his PCP.  Abdominal Pain This is a new problem. The current episode started in the past 7 days. The onset quality is gradual. The problem occurs constantly. The problem has been unchanged. The pain is located in the RUQ. The pain is at a severity of 5/10. The pain is mild. The abdominal pain does not radiate. Associated symptoms include flatus. Pertinent negatives include no belching, constipation, diarrhea, dysuria, frequency, headaches, hematuria, nausea or vomiting. The pain is aggravated by movement. Relieved by: rest. He has tried acetaminophen (pt states the oxycodone makes him constipated) for the symptoms. The treatment provided mild relief.      Review of Systems  Gastrointestinal: Positive for abdominal pain and flatus. Negative for nausea, vomiting, diarrhea and constipation.  Genitourinary: Negative for dysuria, frequency and hematuria.  Neurological: Negative for headaches.  All other systems reviewed and are negative.      Objective:   Physical Exam  Constitutional: He is oriented to person, place, and time. He appears well-developed and well-nourished. No distress.  HENT:  Head: Normocephalic.  Eyes: Pupils are equal, round, and reactive to light. Right eye exhibits no discharge. Left eye exhibits no discharge.  Neck: Normal range of motion. Neck supple. No thyromegaly present.  Cardiovascular: Normal rate, regular rhythm, normal heart sounds and intact distal pulses.   No murmur heard. Pulmonary/Chest: Effort normal and breath sounds normal. No respiratory distress. He has no wheezes.  Abdominal: Soft. Bowel sounds are normal. He exhibits no distension. There is no tenderness.  Musculoskeletal: Normal range of motion. He exhibits no  edema or tenderness.  Neurological: He is alert and oriented to person, place, and time. He has normal reflexes. No cranial nerve deficit.  Skin: Skin is warm and dry. No rash noted. No erythema.  Incision in right groin with no redness, swelling, warmth, or discharge   Psychiatric: He has a normal mood and affect. His behavior is normal. Judgment and thought content normal.  Vitals reviewed.   BP 136/79 mmHg  Pulse 69  Temp(Src) 96.8 F (36 C) (Oral)  Ht 5\' 11"  (1.803 m)  Wt 181 lb (82.101 kg)  BMI 25.26 kg/m2  KUB- WNL Preliminary reading by Evelina Dun, FNP [facility]      Assessment & Plan:  1. Persistent atrial fibrillation ([facility]) -Stable- Pt saw Tammy today Anticoagulation Dose Instructions as of 10/20/2015      Dorene Grebe Tue Wed Thu Fri Sat   New Dose 5 mg 5 mg 5 mg 5 mg 5 mg 5 mg 2.5 mg    Description        Take 1 and 1/2 tablet today (Tuesday, June 20th).  Then restart usual dose of warfarin 5mg  - 1/2 tablet on Saturdays and 1 tablet all other days.       2. Chronic atrial fibrillation ([facility]) -Stable - CoaguChek XS/INR Waived  3. Acute right lower quadrant pain - DG Abd 1 View; Future  4. History of hernia repair   PT had hernia repair 8 days ago, no redness, warmth, discharge noted. Pt to continue with tylenol for pain. Oxycodone makes pt constipated. RTO prn   Evelina Dun, FNP

## 2015-10-20 NOTE — Patient Instructions (Addendum)
Anticoagulation Dose Instructions as of 10/20/2015      [name] Tue Wed Thu Fri Sat   New Dose 5 mg 5 mg 5 mg 5 mg 5 mg 5 mg 2.5 mg    Description        Take 1 and 1/2 tablet today (Tuesday, June 20th).  Then restart usual dose of warfarin 5mg  - 1/2 tablet on Saturdays and 1 tablet all other days.     INR was 1.6 today Laparoscopic Ventral Hernia Repair, Care After Refer to this sheet in the next few weeks. These instructions provide you with information on caring for yourself after your procedure. Your caregiver may also give you more specific instructions. Your treatment has been planned according to current medical practices, but problems sometimes occur. Call your caregiver if you have any problems or questions after your procedure.  HOME CARE INSTRUCTIONS   Only take over-the-counter or prescription medicines as directed by your caregiver. If antibiotic medicines are prescribed, take them as directed. Finish them even if you start to feel better.  Always wash your hands before touching your abdomen.  Take your bandages (dressings) off after 48 hours or as directed by your caregiver. You may have skin adhesive strips or skin glue over the surgical cuts (incisions). Do not take the strips off or peel the glue off. These will fall off on their own.  Take showers once your caregiver approves. Until then, only take sponge baths. Do not take tub baths or go swimming until your caregiver approves.  Check your incision area every day for swelling, redness, warmth, and blood or fluid leaking from the incision. These are signs of infection. Wash your hands before you check.  Hold a pillow over your abdomen when you cough or sneeze to help ease pain.  Eat foods that are high in fiber, such as whole grains, fruits, and vegetables. Fiber helps prevent difficult bowel movements (constipation).  Drink enough fluids to keep your urine clear or pale yellow.  Rest and lessen activity for 4-5 days  after the surgery. You may take short walks if your caregiver approves. Do not drive until approved by your caregiver.  Do not lift anything heavy, participate in sports, or have sexual intercourse for 6-8 weeks or until your caregiver approves.   Ask your caregiver when you can return to work.  Keep all follow-up appointments. SEEK MEDICAL CARE IF:   You have pain even after taking pain medicine.  You have not had a bowel movement in 3 days.  You have cramps or are nauseous. SEEK IMMEDIATE MEDICAL CARE IF:   You have pain or swelling that is getting worse.  You have redness around your incisions.  Your incision is pulling apart.  You have blood or fluid leaking from your incisions.  You are vomiting.  You cannot pass urine. MAKE SURE YOU:   Understand these instructions.  Will watch your condition.  Will get help right away if you are not doing well or get worse.   This information is not intended to replace advice given to you by your health care provider. Make sure you discuss any questions you have with your health care provider.   Document Released: 04/04/2012 Document Revised: 05/09/2014 Document Reviewed: 04/04/2012 Elsevier Interactive Patient Education [employer].

## 2015-10-27 ENCOUNTER — Telehealth: Payer: Self-pay | Admitting: Cardiology

## 2015-10-27 ENCOUNTER — Telehealth: Payer: Self-pay | Admitting: *Deleted

## 2015-10-27 NOTE — Telephone Encounter (Signed)
error 

## 2015-10-27 NOTE — Telephone Encounter (Signed)
Spoke to patient regarding "jerking" he is experiencing in his body. It was not completely clear as to where he was experiencing the jerking, but he states that it began yesterday and he's had it on and off since then. Appointment made with the device clinic for tomorrow @ 1200 to r/o diaphragmatic stimulation.  Patient voiced understanding.

## 2015-10-28 ENCOUNTER — Ambulatory Visit (INDEPENDENT_AMBULATORY_CARE_PROVIDER_SITE_OTHER): Payer: Medicare Other | Admitting: Ophthalmology

## 2015-10-28 ENCOUNTER — Ambulatory Visit (INDEPENDENT_AMBULATORY_CARE_PROVIDER_SITE_OTHER): Payer: Medicare Other | Admitting: *Deleted

## 2015-10-28 DIAGNOSIS — Z959 Presence of cardiac and vascular implant and graft, unspecified: Secondary | ICD-10-CM

## 2015-10-28 LAB — CUP PACEART INCLINIC DEVICE CHECK
Implantable Lead Implant Date: 20170213
Implantable Lead Location: 753858
MDC IDC LEAD IMPLANT DT: 20120927
MDC IDC LEAD IMPLANT DT: 20170213
MDC IDC LEAD LOCATION: 753859
MDC IDC LEAD LOCATION: 753860
MDC IDC LEAD MODEL: 7122
MDC IDC PG SERIAL: 7335682
MDC IDC SESS DTM: 20170628124339

## 2015-10-28 NOTE — Progress Notes (Signed)
CRT-D device check in office. Pt reports feeling intermittent stimulation in left lateral chest wall. RV threshold increased to 5 V, LV threshold increased to 7.5V and positional maneuvers recreated without reproduction of stimulation. Remote check 8/15.

## 2015-10-30 ENCOUNTER — Encounter: Payer: Self-pay | Admitting: Pharmacist Clinician (PhC)/ Clinical Pharmacy Specialist

## 2015-11-02 ENCOUNTER — Ambulatory Visit (INDEPENDENT_AMBULATORY_CARE_PROVIDER_SITE_OTHER): Payer: Medicare Other | Admitting: Pharmacist

## 2015-11-02 ENCOUNTER — Encounter: Payer: Self-pay | Admitting: Pediatrics

## 2015-11-02 DIAGNOSIS — I4819 Other persistent atrial fibrillation: Secondary | ICD-10-CM

## 2015-11-02 DIAGNOSIS — I482 Chronic atrial fibrillation, unspecified: Secondary | ICD-10-CM

## 2015-11-02 DIAGNOSIS — I481 Persistent atrial fibrillation: Secondary | ICD-10-CM

## 2015-11-02 LAB — COAGUCHEK XS/INR WAIVED
INR: 2.1 — AB (ref 0.9–1.1)
PROTHROMBIN TIME: 25 s

## 2015-11-02 NOTE — Patient Instructions (Signed)
Anticoagulation Dose Instructions as of 11/02/2015      [name] Tue Wed Thu Fri Sat   New Dose 5 mg 5 mg 5 mg 5 mg 5 mg 5 mg 2.5 mg    Description        Continue usual dose of warfarin 5mg  - 1/2 tablet on Saturdays and 1 tablet all other days.     INR was 2.1 today

## 2015-11-04 ENCOUNTER — Ambulatory Visit (INDEPENDENT_AMBULATORY_CARE_PROVIDER_SITE_OTHER): Payer: Medicare Other | Admitting: Nurse Practitioner

## 2015-11-04 ENCOUNTER — Encounter: Payer: Self-pay | Admitting: Nurse Practitioner

## 2015-11-04 ENCOUNTER — Ambulatory Visit (INDEPENDENT_AMBULATORY_CARE_PROVIDER_SITE_OTHER): Payer: Medicare Other

## 2015-11-04 VITALS — BP 121/74 | HR 77 | Temp 97.0°F | Ht 71.0 in | Wt 183.0 lb

## 2015-11-04 DIAGNOSIS — M79672 Pain in left foot: Secondary | ICD-10-CM

## 2015-11-04 NOTE — Progress Notes (Signed)
   Subjective:    Patient ID: [name], male    DOB: 1941/04/11, 75 y.o.   MRN: [id number]  HPI Patient in today c/o left heel pain- started 3 weeks ago- said that he had a rock in his shoe and every since then it has been hurting. Pain is when he is walking.   Review of Systems  Constitutional: Negative.   HENT: Negative.   Respiratory: Negative.   Cardiovascular: Negative.   Genitourinary: Negative.   Neurological: Negative.   Psychiatric/Behavioral: Negative.   All other systems reviewed and are negative.      Objective:   Physical Exam  Constitutional: He is oriented to person, place, and time. He appears well-developed and well-nourished.  Cardiovascular: Normal rate, regular rhythm and normal heart sounds.   Pulmonary/Chest: Effort normal and breath sounds normal.  Musculoskeletal:  Pain on palpation of bottom of heel and back  Of heel  Neurological: He is alert and oriented to person, place, and time.  Skin: Skin is warm.  Psychiatric: He has a normal mood and affect. His behavior is normal. Judgment and thought content normal.    BP 121/74 mmHg  Pulse 77  Temp(Src) 97 F (36.1 C) (Oral)  Ht 5\' 11"  (1.803 m)  Wt 183 lb (83.008 kg)  BMI 25.53 kg/m2       Assessment & Plan:   1. Heel pain, left    Tylenol otc Ice RTO prn  Mary-Margaret Hassell Done, FNP

## 2015-11-06 ENCOUNTER — Other Ambulatory Visit: Payer: Self-pay | Admitting: *Deleted

## 2015-11-06 ENCOUNTER — Ambulatory Visit: Payer: Medicare Other | Admitting: Pediatrics

## 2015-11-06 MED ORDER — CARVEDILOL 6.25 MG PO TABS: 6.2500 mg | ORAL_TABLET | Freq: Two times a day (BID) | ORAL | Status: DC

## 2015-11-06 MED ORDER — ISOSORBIDE MONONITRATE ER 60 MG PO TB24: 60.0000 mg | ORAL_TABLET | Freq: Every day | ORAL | Status: DC

## 2015-11-06 NOTE — Telephone Encounter (Signed)
lisinopril (PRINIVIL,ZESTRIL) 5 MG tablet  Medication   Date: 06/16/2015  Department: [facility] 2W CARDIAC UNIT  Ordering/Authorizing: Baldwin [place], PA-C      Order Providers    Prescribing Provider Encounter Provider   Renee Dyane Dustman, PA-C None    Medication Detail      Disp Refills Start End     lisinopril (PRINIVIL,ZESTRIL) 5 MG tablet 90 tablet 3 06/16/2015     Sig - Route: Take 1 tablet (5 mg total) by mouth every other day. 1 tablet by mouth every other day or as directed - Oral    E-Prescribing Status: Receipt confirmed by pharmacy (06/16/2015 8:00 AM EST)     Pharmacy    [facility], [place]

## 2015-11-12 ENCOUNTER — Ambulatory Visit (INDEPENDENT_AMBULATORY_CARE_PROVIDER_SITE_OTHER): Payer: Medicare Other | Admitting: Pediatrics

## 2015-11-12 ENCOUNTER — Encounter: Payer: Self-pay | Admitting: Pediatrics

## 2015-11-12 VITALS — BP 127/78 | HR 73 | Temp 97.4°F | Ht 71.0 in | Wt 185.0 lb

## 2015-11-12 DIAGNOSIS — F419 Anxiety disorder, unspecified: Secondary | ICD-10-CM

## 2015-11-12 DIAGNOSIS — I25119 Atherosclerotic heart disease of native coronary artery with unspecified angina pectoris: Secondary | ICD-10-CM

## 2015-11-12 DIAGNOSIS — I255 Ischemic cardiomyopathy: Secondary | ICD-10-CM | POA: Diagnosis not present

## 2015-11-12 DIAGNOSIS — I5022 Chronic systolic (congestive) heart failure: Secondary | ICD-10-CM | POA: Diagnosis not present

## 2015-11-12 DIAGNOSIS — I442 Atrioventricular block, complete: Secondary | ICD-10-CM

## 2015-11-12 DIAGNOSIS — Z951 Presence of aortocoronary bypass graft: Secondary | ICD-10-CM

## 2015-11-12 DIAGNOSIS — I4819 Other persistent atrial fibrillation: Secondary | ICD-10-CM

## 2015-11-12 DIAGNOSIS — M542 Cervicalgia: Secondary | ICD-10-CM

## 2015-11-12 DIAGNOSIS — R609 Edema, unspecified: Secondary | ICD-10-CM

## 2015-11-12 DIAGNOSIS — I10 Essential (primary) hypertension: Secondary | ICD-10-CM

## 2015-11-12 DIAGNOSIS — I481 Persistent atrial fibrillation: Secondary | ICD-10-CM | POA: Diagnosis not present

## 2015-11-12 DIAGNOSIS — E871 Hypo-osmolality and hyponatremia: Secondary | ICD-10-CM | POA: Diagnosis not present

## 2015-11-12 MED ORDER — FUROSEMIDE 20 MG PO TABS: 20.0000 mg | ORAL_TABLET | Freq: Every day | ORAL | Status: DC | PRN

## 2015-11-12 MED ORDER — DOXEPIN HCL 25 MG PO CAPS: ORAL_CAPSULE | ORAL | Status: DC

## 2015-11-12 MED ORDER — ALPRAZOLAM 1 MG PO TABS: 1.0000 mg | ORAL_TABLET | Freq: Three times a day (TID) | ORAL | Status: DC | PRN
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
## 2015-11-13 ENCOUNTER — Encounter: Payer: Self-pay | Admitting: Family Medicine

## 2015-11-13 ENCOUNTER — Other Ambulatory Visit: Payer: Self-pay | Admitting: *Deleted

## 2015-11-13 ENCOUNTER — Ambulatory Visit (INDEPENDENT_AMBULATORY_CARE_PROVIDER_SITE_OTHER): Payer: Medicare Other | Admitting: Family Medicine

## 2015-11-13 VITALS — BP 134/77 | HR 87 | Temp 97.0°F | Ht 71.0 in | Wt 185.0 lb

## 2015-11-13 DIAGNOSIS — N3000 Acute cystitis without hematuria: Secondary | ICD-10-CM | POA: Diagnosis not present

## 2015-11-13 DIAGNOSIS — R399 Unspecified symptoms and signs involving the genitourinary system: Secondary | ICD-10-CM

## 2015-11-13 LAB — BMP8+EGFR
BUN/Creatinine Ratio: 11 (ref 10–24)
BUN: 10 mg/dL (ref 8–27)
CALCIUM: 8.9 mg/dL (ref 8.6–10.2)
CO2: 27 mmol/L (ref 18–29)
CREATININE: 0.91 mg/dL (ref 0.76–1.27)
Chloride: 91 mmol/L — ABNORMAL LOW (ref 96–106)
GFR, EST AFRICAN AMERICAN: 95 mL/min/{1.73_m2} (ref >59)
GFR, EST NON AFRICAN AMERICAN: 82 mL/min/{1.73_m2} (ref >59)
GLUCOSE: 88 mg/dL (ref 65–99)
POTASSIUM: 4.5 mmol/L (ref 3.5–5.2)
SODIUM: 133 mmol/L — AB (ref 134–144)

## 2015-11-13 LAB — URINALYSIS, COMPLETE
Bilirubin, UA: NEGATIVE
Glucose, UA: NEGATIVE
Ketones, UA: NEGATIVE
Nitrite, UA: NEGATIVE
PH UA: 7 (ref 5.0–7.5)
Specific Gravity, UA: 1.02 (ref 1.005–1.030)
UUROB: 1 mg/dL (ref 0.2–1.0)

## 2015-11-13 LAB — MICROSCOPIC EXAMINATION: EPITHELIAL CELLS (NON RENAL): NONE SEEN /HPF (ref 0–10)

## 2015-11-13 MED ORDER — CEPHALEXIN 500 MG PO CAPS: 500.0000 mg | ORAL_CAPSULE | Freq: Four times a day (QID) | ORAL | Status: DC

## 2015-11-13 MED ORDER — LISINOPRIL 5 MG PO TABS: 5.0000 mg | ORAL_TABLET | ORAL | Status: AC

## 2015-11-13 NOTE — Telephone Encounter (Signed)
Rx has been sent to the pharmacy electronically. ° °

## 2015-11-13 NOTE — Addendum Note (Signed)
Addended by: Caryl Pina on: 11/13/2015 01:03 PM   Modules accepted: Orders, SmartSet

## 2015-11-13 NOTE — Progress Notes (Signed)
BP 134/77 mmHg  Pulse 87  Temp(Src) 97 F (36.1 C) (Oral)  Ht 5\' 11"  (1.803 m)  Wt 185 lb (83.915 kg)  BMI 25.81 kg/m2   Subjective:    Patient ID: [name], male    DOB: 10/01/1940, 75 y.o.   MRN: [id number]  HPI: [name] is a 75 y.o. male presenting on 11/13/2015 for Urinary Frequency and Burning with urination   HPI Dysuria and suprapubic abdominal pain Patient comes in with dysuria and suprapubic abdominal pain is been going on for the past day. He did have a catheter in a week ago for 11 days after a procedure. He denies any fevers or chills or flank pain. He denies any hematuria. He does have increased frequency  Relevant past medical, surgical, family and social history reviewed and updated as indicated. Interim medical history since our last visit reviewed. Allergies and medications reviewed and updated.  Review of Systems  Constitutional: Negative for fever.  HENT: Negative for ear discharge and ear pain.   Eyes: Negative for discharge and visual disturbance.  Respiratory: Negative for shortness of breath and wheezing.   Cardiovascular: Negative for chest pain and leg swelling.  Gastrointestinal: Positive for abdominal pain. Negative for diarrhea and constipation.  Genitourinary: Positive for dysuria and frequency. Negative for hematuria, flank pain and difficulty urinating.  Musculoskeletal: Negative for back pain and gait problem.  Skin: Negative for rash.  Neurological: Negative for syncope, light-headedness and headaches.  All other systems reviewed and are negative.   Per HPI unless specifically indicated above     Medication List       This list is accurate as of: 11/13/15 12:03 PM.  Always use your most recent med list.               acetaminophen 500 MG tablet  Commonly known as:  TYLENOL  Take 500 mg by mouth every 8 (eight) hours as needed for mild pain.     ALPRAZolam 1 MG tablet  Commonly known as:  XANAX  Take 1 tablet (1  mg total) by mouth 3 (three) times daily as needed. For anxiety     carvedilol 6.25 MG tablet  Commonly known as:  COREG  Take 1 tablet (6.25 mg total) by mouth 2 (two) times daily.     cephALEXin 500 MG capsule  Commonly known as:  KEFLEX  Take 1 capsule (500 mg total) by mouth 4 (four) times daily.     cyclobenzaprine 5 MG tablet  Commonly known as:  FLEXERIL  Take 1 tablet (5 mg total) by mouth 3 (three) times daily as needed for muscle spasms.     doxepin 25 MG capsule  Commonly known as:  SINEQUAN  Take 25 mg (1 capsule) by mouth in the morning and 50 mg (2 capsules) by mouth at night.     fluticasone 50 MCG/ACT nasal spray  Commonly known as:  FLONASE  Place 2 sprays into both nostrils daily.     furosemide 20 MG tablet  Commonly known as:  LASIX  Take 1 tablet (20 mg total) by mouth daily as needed for fluid. One tablet by mouth daily as needed for swelling     gabapentin 100 MG capsule  Commonly known as:  NEURONTIN  Take 100 mg by mouth 4 (four) times daily.     guaiFENesin 600 MG 12 hr tablet  Commonly known as:  MUCINEX  Take 600 mg by mouth 2 (two) times daily. Reported  on 11/12/2015     hydroxypropyl methylcellulose / hypromellose 2.5 % ophthalmic solution  Commonly known as:  ISOPTO TEARS / GONIOVISC  Place 1 drop into both eyes 3 (three) times daily as needed for dry eyes.     isosorbide mononitrate 60 MG 24 hr tablet  Commonly known as:  IMDUR  Take 1 tablet (60 mg total) by mouth daily.     lisinopril 5 MG tablet  Commonly known as:  PRINIVIL,ZESTRIL  Take 1 tablet (5 mg total) by mouth every other day. 1 tablet by mouth every other day or as directed     loratadine 10 MG tablet  Commonly known as:  CLARITIN  Take 10 mg by mouth daily.     nitroGLYCERIN 0.4 MG SL tablet  Commonly known as:  NITROSTAT  DISSOLVE ONE TABLET UNDER THE TONGUE EVERY 5 MINUTES AS NEEDED FOR CHEST PAIN.  DO NOT EXCEED A TOTAL OF 3 DOSES IN 15 MINUTES     omeprazole 20 MG  capsule  Commonly known as:  PRILOSEC  Take 1 capsule (20 mg total) by mouth 2 (two) times daily.     polyethylene glycol packet  Commonly known as:  MIRALAX / GLYCOLAX  Take 17 g by mouth daily.     PRESERVISION AREDS 2 Caps  Take 2 capsules by mouth daily. Reported on 06/26/2015     rosuvastatin 10 MG tablet  Commonly known as:  CRESTOR  Take 1 tablet (10 mg total) by mouth daily.           Objective:    BP 134/77 mmHg  Pulse 87  Temp(Src) 97 F (36.1 C) (Oral)  Ht 5\' 11"  (1.803 m)  Wt 185 lb (83.915 kg)  BMI 25.81 kg/m2  Wt Readings from Last 3 Encounters:  11/13/15 185 lb (83.915 kg)  11/12/15 185 lb (83.915 kg)  11/04/15 183 lb (83.008 kg)    Physical Exam  Constitutional: He is oriented to person, place, and time. He appears well-developed and well-nourished. No distress.  Eyes: Conjunctivae and EOM are normal. Pupils are equal, round, and reactive to light. Right eye exhibits no discharge. No scleral icterus.  Cardiovascular: Normal rate, regular rhythm, normal heart sounds and intact distal pulses.   No murmur heard. Pulmonary/Chest: Effort normal and breath sounds normal. No respiratory distress. He has no wheezes.  Abdominal: Soft. Bowel sounds are normal. He exhibits no distension. There is tenderness (Mild suprapubic tenderness, no CVA tenderness). There is no rebound and no guarding.  Musculoskeletal: Normal range of motion. He exhibits no edema.  Neurological: He is alert and oriented to person, place, and time. Coordination normal.  Skin: Skin is warm and dry. No rash noted. He is not diaphoretic.  Psychiatric: He has a normal mood and affect. His behavior is normal.  Nursing note and vitals reviewed.   Urinalysis: 3+ leukocytes and 2+ blood    Assessment & Plan:   Problem List Items Addressed This Visit    None    Visit Diagnoses    UTI symptoms    -  Primary    Relevant Medications    cephALEXin (KEFLEX) 500 MG capsule    Other Relevant Orders     Urinalysis, Complete    Urine culture    Acute cystitis without hematuria        Relevant Medications    cephALEXin (KEFLEX) 500 MG capsule    Other Relevant Orders    Urine culture  Follow up plan: No Follow-up on file.  Counseling provided for all of the vaccine components Orders Placed This Encounter  Procedures  . Urinalysis, Complete    Caryl Pina, MD [facility] 11/13/2015, 12:03 PM

## 2015-11-16 ENCOUNTER — Ambulatory Visit (INDEPENDENT_AMBULATORY_CARE_PROVIDER_SITE_OTHER): Payer: Medicare Other | Admitting: Pediatrics

## 2015-11-16 ENCOUNTER — Encounter: Payer: Self-pay | Admitting: Pediatrics

## 2015-11-16 ENCOUNTER — Emergency Department (HOSPITAL_COMMUNITY): Payer: Medicare Other

## 2015-11-16 ENCOUNTER — Inpatient Hospital Stay (HOSPITAL_COMMUNITY)
Admission: EM | Admit: 2015-11-16 | Discharge: 2015-11-20 | DRG: 644 | Disposition: A | Discharge status: Home / Self Care | Payer: Medicare Other | Attending: Internal Medicine | Admitting: Internal Medicine

## 2015-11-16 ENCOUNTER — Encounter (HOSPITAL_COMMUNITY): Payer: Self-pay | Admitting: Emergency Medicine

## 2015-11-16 VITALS — BP 115/66 | HR 73 | Temp 98.3°F | Ht 71.0 in | Wt 188.6 lb

## 2015-11-16 DIAGNOSIS — R39198 Other difficulties with micturition: Secondary | ICD-10-CM | POA: Diagnosis not present

## 2015-11-16 DIAGNOSIS — R339 Retention of urine, unspecified: Secondary | ICD-10-CM | POA: Diagnosis present

## 2015-11-16 DIAGNOSIS — E861 Hypovolemia: Secondary | ICD-10-CM | POA: Diagnosis present

## 2015-11-16 DIAGNOSIS — M797 Fibromyalgia: Secondary | ICD-10-CM | POA: Diagnosis present

## 2015-11-16 DIAGNOSIS — Z9049 Acquired absence of other specified parts of digestive tract: Secondary | ICD-10-CM

## 2015-11-16 DIAGNOSIS — I255 Ischemic cardiomyopathy: Secondary | ICD-10-CM | POA: Diagnosis present

## 2015-11-16 DIAGNOSIS — I2510 Atherosclerotic heart disease of native coronary artery without angina pectoris: Secondary | ICD-10-CM | POA: Diagnosis present

## 2015-11-16 DIAGNOSIS — K219 Gastro-esophageal reflux disease without esophagitis: Secondary | ICD-10-CM | POA: Diagnosis present

## 2015-11-16 DIAGNOSIS — I110 Hypertensive heart disease with heart failure: Secondary | ICD-10-CM | POA: Diagnosis present

## 2015-11-16 DIAGNOSIS — N3001 Acute cystitis with hematuria: Secondary | ICD-10-CM | POA: Diagnosis not present

## 2015-11-16 DIAGNOSIS — Z833 Family history of diabetes mellitus: Secondary | ICD-10-CM

## 2015-11-16 DIAGNOSIS — Z7951 Long term (current) use of inhaled steroids: Secondary | ICD-10-CM

## 2015-11-16 DIAGNOSIS — R609 Edema, unspecified: Secondary | ICD-10-CM

## 2015-11-16 DIAGNOSIS — Z85828 Personal history of other malignant neoplasm of skin: Secondary | ICD-10-CM

## 2015-11-16 DIAGNOSIS — E8779 Other fluid overload: Secondary | ICD-10-CM | POA: Diagnosis present

## 2015-11-16 DIAGNOSIS — F410 Panic disorder [episodic paroxysmal anxiety] without agoraphobia: Secondary | ICD-10-CM | POA: Diagnosis present

## 2015-11-16 DIAGNOSIS — N390 Urinary tract infection, site not specified: Secondary | ICD-10-CM | POA: Diagnosis present

## 2015-11-16 DIAGNOSIS — Z7901 Long term (current) use of anticoagulants: Secondary | ICD-10-CM

## 2015-11-16 DIAGNOSIS — N4289 Other specified disorders of prostate: Secondary | ICD-10-CM

## 2015-11-16 DIAGNOSIS — E222 Syndrome of inappropriate secretion of antidiuretic hormone: Secondary | ICD-10-CM | POA: Diagnosis not present

## 2015-11-16 DIAGNOSIS — R509 Fever, unspecified: Secondary | ICD-10-CM | POA: Diagnosis not present

## 2015-11-16 DIAGNOSIS — Z87891 Personal history of nicotine dependence: Secondary | ICD-10-CM

## 2015-11-16 DIAGNOSIS — I5022 Chronic systolic (congestive) heart failure: Secondary | ICD-10-CM | POA: Diagnosis present

## 2015-11-16 DIAGNOSIS — Z961 Presence of intraocular lens: Secondary | ICD-10-CM | POA: Diagnosis present

## 2015-11-16 DIAGNOSIS — G4700 Insomnia, unspecified: Secondary | ICD-10-CM | POA: Diagnosis present

## 2015-11-16 DIAGNOSIS — B952 Enterococcus as the cause of diseases classified elsewhere: Secondary | ICD-10-CM | POA: Diagnosis present

## 2015-11-16 DIAGNOSIS — Z950 Presence of cardiac pacemaker: Secondary | ICD-10-CM

## 2015-11-16 DIAGNOSIS — I482 Chronic atrial fibrillation: Secondary | ICD-10-CM | POA: Diagnosis present

## 2015-11-16 DIAGNOSIS — E785 Hyperlipidemia, unspecified: Secondary | ICD-10-CM | POA: Diagnosis present

## 2015-11-16 DIAGNOSIS — N3289 Other specified disorders of bladder: Secondary | ICD-10-CM

## 2015-11-16 DIAGNOSIS — N400 Enlarged prostate without lower urinary tract symptoms: Secondary | ICD-10-CM | POA: Diagnosis present

## 2015-11-16 DIAGNOSIS — E871 Hypo-osmolality and hyponatremia: Secondary | ICD-10-CM | POA: Diagnosis not present

## 2015-11-16 DIAGNOSIS — R319 Hematuria, unspecified: Secondary | ICD-10-CM | POA: Diagnosis not present

## 2015-11-16 DIAGNOSIS — I4891 Unspecified atrial fibrillation: Secondary | ICD-10-CM | POA: Diagnosis present

## 2015-11-16 DIAGNOSIS — I10 Essential (primary) hypertension: Secondary | ICD-10-CM | POA: Diagnosis present

## 2015-11-16 DIAGNOSIS — Z951 Presence of aortocoronary bypass graft: Secondary | ICD-10-CM

## 2015-11-16 DIAGNOSIS — D696 Thrombocytopenia, unspecified: Secondary | ICD-10-CM | POA: Diagnosis present

## 2015-11-16 DIAGNOSIS — Z823 Family history of stroke: Secondary | ICD-10-CM

## 2015-11-16 DIAGNOSIS — C679 Malignant neoplasm of bladder, unspecified: Secondary | ICD-10-CM | POA: Diagnosis present

## 2015-11-16 DIAGNOSIS — Z66 Do not resuscitate: Secondary | ICD-10-CM | POA: Diagnosis present

## 2015-11-16 DIAGNOSIS — R972 Elevated prostate specific antigen [PSA]: Secondary | ICD-10-CM | POA: Diagnosis present

## 2015-11-16 DIAGNOSIS — Z8249 Family history of ischemic heart disease and other diseases of the circulatory system: Secondary | ICD-10-CM

## 2015-11-16 HISTORY — DX: Hypo-osmolality and hyponatremia: E87.1

## 2015-11-16 HISTORY — DX: Other specified disorders of bladder: N32.89

## 2015-11-16 LAB — CBC WITH DIFFERENTIAL/PLATELET
BASOS ABS: 0 10*3/uL (ref 0.0–0.1)
Basophils Relative: 0 %
EOS PCT: 1 %
Eosinophils Absolute: 0.1 10*3/uL (ref 0.0–0.7)
HEMATOCRIT: 36.2 % — AB (ref 39.0–52.0)
Hemoglobin: 12.2 g/dL — ABNORMAL LOW (ref 13.0–17.0)
LYMPHS ABS: 1.4 10*3/uL (ref 0.7–4.0)
LYMPHS PCT: 13 %
MCH: 30 pg (ref 26.0–34.0)
MCHC: 33.7 g/dL (ref 30.0–36.0)
MCV: 89.2 fL (ref 78.0–100.0)
MONO ABS: 1.1 10*3/uL — AB (ref 0.1–1.0)
MONOS PCT: 10 %
NEUTROS ABS: 7.8 10*3/uL — AB (ref 1.7–7.7)
Neutrophils Relative %: 76 %
Platelets: 125 10*3/uL — ABNORMAL LOW (ref 150–400)
RBC: 4.06 MIL/uL — ABNORMAL LOW (ref 4.22–5.81)
RDW: 14.1 % (ref 11.5–15.5)
WBC: 10.3 10*3/uL (ref 4.0–10.5)

## 2015-11-16 LAB — URINALYSIS, ROUTINE W REFLEX MICROSCOPIC
BILIRUBIN URINE: NEGATIVE
GLUCOSE, UA: NEGATIVE mg/dL
Nitrite: NEGATIVE
PROTEIN: 100 mg/dL — AB
Specific Gravity, Urine: 1.02 (ref 1.005–1.030)
pH: 6 (ref 5.0–8.0)

## 2015-11-16 LAB — URINE CULTURE

## 2015-11-16 LAB — COMPREHENSIVE METABOLIC PANEL
ALT: 17 U/L (ref 17–63)
AST: 21 U/L (ref 15–41)
Albumin: 3.6 g/dL (ref 3.5–5.0)
Alkaline Phosphatase: 92 U/L (ref 38–126)
Anion gap: 4 — ABNORMAL LOW (ref 5–15)
BILIRUBIN TOTAL: 1.7 mg/dL — AB (ref 0.3–1.2)
BUN: 17 mg/dL (ref 6–20)
CO2: 26 mmol/L (ref 22–32)
CREATININE: 0.93 mg/dL (ref 0.61–1.24)
Calcium: 7.9 mg/dL — ABNORMAL LOW (ref 8.9–10.3)
Chloride: 88 mmol/L — ABNORMAL LOW (ref 101–111)
Glucose, Bld: 110 mg/dL — ABNORMAL HIGH (ref 65–99)
POTASSIUM: 4.3 mmol/L (ref 3.5–5.1)
Sodium: 118 mmol/L — CL (ref 135–145)
TOTAL PROTEIN: 6.4 g/dL — AB (ref 6.5–8.1)

## 2015-11-16 LAB — URINE MICROSCOPIC-ADD ON

## 2015-11-16 MED ORDER — SODIUM CHLORIDE 0.9 % IV BOLUS (SEPSIS)
500.0000 mL | Freq: Once | INTRAVENOUS | Status: AC
  Administered 2015-11-16: 500 mL via INTRAVENOUS

## 2015-11-16 MED ORDER — DEXTROSE 5 % IV SOLN
1.0000 g | Freq: Once | INTRAVENOUS | Status: AC
  Administered 2015-11-16: 1 g via INTRAVENOUS
  Filled 2015-11-16: qty 10

## 2015-11-16 MED ORDER — IOPAMIDOL (ISOVUE-300) INJECTION 61%
100.0000 mL | Freq: Once | INTRAVENOUS | Status: AC | PRN
  Administered 2015-11-16: 100 mL via INTRAVENOUS

## 2015-11-16 NOTE — H&P (Signed)
History and Physical    [name] [id number] DOB: [birth year] DOA: 11/16/2015  PCP: Eustaquio Maize, MD Consultants:  GI - [place]; Tresa Endo - kidney specialist, [facility] Patient coming from:  Home, lives with daughter  Chief Complaint: couldn't pee  HPI: [name] is a 75 y.o. male with medical history significant of CAD, cardiomyopathy,  afib on anticoagulation, and recent hernia surgery presents with inability to void.  Patient reports having had a  catheter that had been in for 11 days last Tuesday.  It was removed in his urology office (in [place]) and he was able to void without difficulty when it was removed.  He continued to pee well but then Thursday started having dysuria.  Started on antibiotics Friday by PCP (appears to be Keflex).  Still with some burning today.  No hematuria.  Unable to void today, significant urinary retention tonight in ER.    Hernia surgery 5 weeks ago and unable to void following surgery, had indwelling foley placed.  Uncircumcised penis, ?phimosis.  Denies feeling weak, confused, or dizzy.  Does report inability to sleep last night and so tired today, but not out of proportion to expected.   ED Course:  Hyponatremia, likely water intoxication.  CT with prostate and bladder masses concerning for carcinoma.  IV Rocephin and foley.  Review of Systems: As per HPI; otherwise 10 point review of systems reviewed and negative.   Ambulatory Status: walks independently  Past Medical History  Diagnosis Date  . Ischemic cardiomyopathy     a. 06/2012 EF 25% by echo.  . Hypertension   . Hyperlipidemia   . GERD (gastroesophageal reflux disease)   . Atrial flutter ([place])     a. 01/2011 s/p unsuccessful RFCA complicated by CHB req PPM.  . CHB (complete heart block) ([place])     a. 01/2011: in setting of RFCA, s/p SJM 2110 Accent DC PPM, ser # [id number].  [name] Kitchen Kidney cysts     a. bilateral  . Hiatal hernia     a. 03/01/2013 EGD and  esoph dil.  [name] Kitchen BPH (benign prostatic hyperplasia)     a. elevated PSA  . A-fib (HCC)     a. chronic coumadin  . Elevated PSA, less than 10 ng/ml   . Fibromyalgia   . Skin cancer     'above left eyebrow; tip of my nose" (08/29/2012)  . Depression     "since 1986-1987" (08/29/2012)  . Anxiety   . Panic attacks   . Chronic systolic CHF (congestive heart failure), NYHA class 2 ([place])     a. 06/2012 Echo: EF 25%, distal sept and apical AK, sev dil LV, mild LVH, mod dil LA.  [name] Kitchen Pacemaker     explanted 06/15/15 >>> CRT-D STJ device, Dr. Caryl Comes  . CAD (coronary artery disease)     a. 1991 s/p CABGx4;  b. redo CABG in 2007 (SVG->OM, VG->PDA->PLV);  c. 01/2011 Cath: LM 80ost, LAD 100p, LCX 100ost, OM1 100, RCA 100, LIMA->LAD ok, VG->PDA ok, VG->OM ok;  d. 03/2012 MV: EF 32% inf & inflat scar w/o ischemia; e. Lexi MV: EF 33%, inf infarct, no ischemia. f. lexiscan 06/12/2014 RCA scar, no ischemia, EF 26%    Past Surgical History  Procedure Laterality Date  . Cardiac catheterization  10/30/2009    Grafts patent. Mild to moderate LV dysfunction. EF 45%. Managed medically.   . Cardiac catheterization      multiple  . Tonsillectomy  1960  . Cholecystectomy  2007  .  Insert / replace / remove pacemaker  01/2011    initial placement  . Inguinal hernia repair  1960's    "left I think"  . Cataract extraction w/ intraocular lens  implant, bilateral  08-05-2010  . Cardioversion  05/25/2012    Procedure: CARDIOVERSION;  Surgeon: Darlin Coco, MD;  Location: [facility] ENDOSCOPY;  Service: Cardiovascular;  Laterality: N/A;  . Humerus surgery Left 1960    "growth on the bone taken off; not cancer" (08/29/2012)  . Excisional hemorrhoidectomy  ~ 1980  . Skin cancer excision      'above left eyebrow; tip of my nose" (08/29/2012)  . Atrial flutter ablation  01/2011    "didn't work" (08/29/2012)  . Coronary artery bypass graft  08/27/1989    CABG X 5  . Coronary artery bypass graft  06/2005    CABG X 3  . Cardiac  catheterization N/A 04/28/2015    Procedure: Left Heart Cath and Cors/Grafts Angiography;  Surgeon: Belva Crome, MD;  Location: [place] CV LAB;  Service: Cardiovascular;  Laterality: N/A;  . Ep implantable device N/A 06/15/2015    STJ, CRT-D, Dr. Caryl Comes  . Inguinal hernia repair Right 10/12/2015    Procedure: RIGHT INGUINAL HERNIA REPAIR WITH MESH;  Surgeon: Jackolyn Confer, MD;  Location: [facility];  Service: General;  Laterality: Right;  . Insertion of mesh Right 10/12/2015    Procedure: INSERTION OF MESH, RIGHT INGUINAL HERNIA;  Surgeon: Jackolyn Confer, MD;  Location: [place];  Service: General;  Laterality: Right;    Social History   Social History  . Marital Status: Widowed    Spouse Name: N/A  . Number of Children: 2  . Years of Education: 12   Occupational History  . Retired    Social History Main Topics  . Smoking status: Former Smoker -- 0.00 packs/day for 0 years    Types: Cigarettes    Quit date: 09/21/1973  . Smokeless tobacco: Never Used  . Alcohol Use: No  . Drug Use: No  . Sexual Activity: No   Other Topics Concern  . Not on file   Social History Narrative   Wife passed away in 2011-08-05. Lives in [place] with his daughter and her family.  Retired from [employer].    Allergies  Allergen Reactions  . Hydrocodone-Acetaminophen Itching  . Prednisone Itching  . Sulfa Drugs Cross Reactors Other (See Comments)    unknown  . Ultram [Tramadol Hcl] Itching    "don't remember how bad"  . Xarelto [Rivaroxaban] Other (See Comments)    Itching   . Zithromax [Azithromycin] Itching    Itching     Family History  Problem Relation Age of Onset  . Stroke Mother   . Heart attack Father     died @ 43, mult MI's.  [name] Kitchen Heart disease Father   . Hypertension Father   . Diabetes Sister     Prior to Admission medications   Medication Sig Start Date End Date Taking? Authorizing Provider  acetaminophen (TYLENOL) 500 MG tablet Take 500 mg by mouth every 8 (eight)  hours as needed for mild pain.    Yes Historical Provider, MD  ALPRAZolam Duanne Moron) 1 MG tablet Take 1 tablet (1 mg total) by mouth 3 (three) times daily as needed. For anxiety 11/12/15  Yes Eustaquio Maize, MD  carvedilol (COREG) 6.25 MG tablet Take 1 tablet (6.25 mg total) by mouth 2 (two) times daily. 11/06/15  Yes Minus Breeding, MD  cephALEXin (KEFLEX) 500 MG capsule  Take 1 capsule (500 mg total) by mouth 4 (four) times daily. 11/13/15  Yes Fransisca Kaufmann Dettinger, MD  doxepin (SINEQUAN) 25 MG capsule Take 25 mg (1 capsule) by mouth in the morning and 50 mg (2 capsules) by mouth at night. Patient taking differently: Take 25-50 mg by mouth 2 (two) times daily. Take 25 mg (1 capsule) by mouth in the morning and 50 mg (2 capsules) by mouth at night. 11/12/15  Yes Eustaquio Maize, MD  fluticasone (FLONASE) 50 MCG/ACT nasal spray Place 2 sprays into both nostrils daily. 04/30/15  Yes Sharion Balloon, FNP  furosemide (LASIX) 20 MG tablet Take 1 tablet (20 mg total) by mouth daily as needed for fluid. One tablet by mouth daily as needed for swelling 11/12/15  Yes Eustaquio Maize, MD  gabapentin (NEURONTIN) 100 MG capsule Take 100 mg by mouth 4 (four) times daily.   Yes Historical Provider, MD  guaiFENesin (MUCINEX) 600 MG 12 hr tablet Take 600 mg by mouth 2 (two) times daily. Reported on 11/12/2015   Yes Historical Provider, MD  hydroxypropyl methylcellulose / hypromellose (ISOPTO TEARS / GONIOVISC) 2.5 % ophthalmic solution Place 1 drop into both eyes 3 (three) times daily as needed for dry eyes.   Yes Historical Provider, MD  isosorbide mononitrate (IMDUR) 60 MG 24 hr tablet Take 1 tablet (60 mg total) by mouth daily. 11/06/15  Yes Minus Breeding, MD  lisinopril (PRINIVIL,ZESTRIL) 5 MG tablet Take 1 tablet (5 mg total) by mouth every other day. 11/13/15  Yes Minus Breeding, MD  loratadine (CLARITIN) 10 MG tablet Take 10 mg by mouth daily.   Yes Historical Provider, MD  Multiple Vitamins-Minerals (PRESERVISION AREDS 2)  CAPS Take 2 capsules by mouth daily. Reported on 06/26/2015   Yes Historical Provider, MD  nitroGLYCERIN (NITROSTAT) 0.4 MG SL tablet DISSOLVE ONE TABLET UNDER THE TONGUE EVERY 5 MINUTES AS NEEDED FOR CHEST PAIN.  DO NOT EXCEED A TOTAL OF 3 DOSES IN 15 MINUTES 05/11/15  Yes Darlin Coco, MD  omeprazole (PRILOSEC) 20 MG capsule Take 1 capsule (20 mg total) by mouth 2 (two) times daily. 03/25/13  Yes Darlin Coco, MD  polyethylene glycol (MIRALAX / GLYCOLAX) packet Take 17 g by mouth daily.    Yes Historical Provider, MD  rosuvastatin (CRESTOR) 10 MG tablet Take 1 tablet (10 mg total) by mouth daily. 03/25/15  Yes Darlin Coco, MD  warfarin (JANTOVEN) 5 MG tablet Take 5 mg by mouth every evening.   Yes Historical Provider, MD    Physical Exam: Filed Vitals:   11/16/15 1735 11/16/15 2046 11/16/15 2100 11/16/15 2322  BP: 129/67 121/78 115/69 134/78  Pulse: 73 65 70 78  Temp: 98.2 F (36.8 C)     TempSrc: Oral     Resp: 16 18  17   Height: 5\' 11"  (1.803 m)     Weight: 85.276 kg (188 lb)     SpO2: 100% 100% 98% 99%     General: Appears calm and comfortable and is NAD Eyes:  PERRL, EOMI, normal lids, iris ENT:  grossly normal hearing, lips & tongue, mmm Neck:  no LAD, masses or thyromegaly Cardiovascular:  RRR, no m/r/g. No LE edema.  Respiratory:  CTA bilaterally, no w/r/r. Normal respiratory effort. Abdomen:  soft, ntnd, NABS Skin:  no rash or induration seen on limited exam; surgical incision along RLQ/groin which is healing appropriately Musculoskeletal:  grossly normal tone BUE/BLE, good ROM, no bony abnormality, no LE edema Psychiatric:  grossly normal mood and  affect, speech fluent and appropriate, AOx3; mildly anxious Neurologic:  CN 2-12 grossly intact, moves all extremities in coordinated fashion, sensation intact  Labs on Admission: I have personally reviewed following labs and imaging studies  CBC:  Recent Labs Lab 11/16/15 2209  WBC 10.3  NEUTROABS 7.8*  HGB  12.2*  HCT 36.2*  MCV 89.2  PLT 0000000*   Basic Metabolic Panel:  Recent Labs Lab 11/12/15 0835 11/16/15 2209  NA 133* 118*  K 4.5 4.3  CL 91* 88*  CO2 27 26  GLUCOSE 88 110*  BUN 10 17  CREATININE 0.91 0.93  CALCIUM 8.9 7.9*   GFR: Estimated Creatinine Clearance: 73.1 mL/min (by C-G formula based on Cr of 0.93). Liver Function Tests:  Recent Labs Lab 11/16/15 2209  AST 21  ALT 17  ALKPHOS 92  BILITOT 1.7*  PROT 6.4*  ALBUMIN 3.6   No results for input(s): LIPASE, AMYLASE in the last 168 hours. No results for input(s): AMMONIA in the last 168 hours. Coagulation Profile: No results for input(s): INR, PROTIME in the last 168 hours. Cardiac Enzymes: No results for input(s): CKTOTAL, CKMB, CKMBINDEX, TROPONINI in the last 168 hours. BNP (last 3 results) No results for input(s): PROBNP in the last 8760 hours. HbA1C: No results for input(s): HGBA1C in the last 72 hours. CBG: No results for input(s): GLUCAP in the last 168 hours. Lipid Profile: No results for input(s): CHOL, HDL, LDLCALC, TRIG, CHOLHDL, LDLDIRECT in the last 72 hours. Thyroid Function Tests: No results for input(s): TSH, T4TOTAL, FREET4, T3FREE, THYROIDAB in the last 72 hours. Anemia Panel: No results for input(s): VITAMINB12, FOLATE, FERRITIN, TIBC, IRON, RETICCTPCT in the last 72 hours. Urine analysis:    Component Value Date/Time   COLORURINE YELLOW 11/16/2015 1815   APPEARANCEUR CLEAR 11/16/2015 1815   APPEARANCEUR Cloudy* 11/13/2015 1146   LABSPEC 1.020 11/16/2015 1815   PHURINE 6.0 11/16/2015 1815   GLUCOSEU NEGATIVE 11/16/2015 1815   HGBUR LARGE* 11/16/2015 1815   BILIRUBINUR NEGATIVE 11/16/2015 1815   BILIRUBINUR Negative 11/13/2015 1146   BILIRUBINUR neg 04/01/2014 1244   KETONESUR TRACE* 11/16/2015 1815   PROTEINUR 100* 11/16/2015 1815   PROTEINUR 2+* 11/13/2015 1146   PROTEINUR neg 04/01/2014 1244   UROBILINOGEN negative 04/01/2014 1244   NITRITE NEGATIVE 11/16/2015 1815    NITRITE Negative 11/13/2015 1146   NITRITE neg 04/01/2014 1244   LEUKOCYTESUR TRACE* 11/16/2015 1815   LEUKOCYTESUR 3+* 11/13/2015 1146    Creatinine Clearance: Estimated Creatinine Clearance: 73.1 mL/min (by C-G formula based on Cr of 0.93).  Sepsis Labs: @LABRCNTIP (procalcitonin:4,lacticidven:4) ) Recent Results (from the past 240 hour(s))  Microscopic Examination     Status: Abnormal   Collection Time: 11/13/15 11:46 AM  Result Value Ref Range Status   WBC, UA >30 (A) 0 -  5 /hpf Final   RBC, UA 3-10 (A) 0 -  2 /hpf Final   Epithelial Cells (non renal) None seen 0 - 10 /hpf Final   Bacteria, UA Few None seen/Few Final  Urine culture     Status: Abnormal   Collection Time: 11/13/15  3:16 PM  Result Value Ref Range Status   Urine Culture, Routine Final report (A)  Final   Urine Culture result 1 Enterococcus faecalis (A)  Final    Comment: Greater than 100,000 colony forming units per mL Note: this isolate is vancomycin-susceptible. This information is provided for epidemiologic purposes only: vancomycin is not among the antibiotics recommended for therapy of urinary tract infections caused by Enterococcus.  RESULT 2 Comment (A)  Final    Comment: Beta hemolytic Streptococcus, group B Greater than 100,000 colony forming units per mL Penicillin and ampicillin are drugs of choice for treatment of beta-hemolytic streptococcal infections. Susceptibility testing of penicillins and other beta-lactam agents approved by the FDA for treatment of beta-hemolytic streptococcal infections need not be performed routinely because nonsusceptible isolates are extremely rare in any beta-hemolytic streptococcus and have not been reported for Streptococcus pyogenes (group A). (CLSI 2011)    ANTIMICROBIAL SUSCEPTIBILITY Comment  Final    Comment:       ** S = Susceptible; I = Intermediate; R = Resistant **                    P = Positive; N = Negative             MICS are expressed in  micrograms per mL    Antibiotic                 RSLT#1    RSLT#2    RSLT#3    RSLT#4 Ciprofloxacin                  R Levofloxacin                   R Nitrofurantoin                 S Penicillin                     S Tetracycline                   R Vancomycin                     S      Radiological Exams on Admission: Ct Abdomen Pelvis W Contrast  11/16/2015  CLINICAL DATA:  Difficulty urinating.  Left flank pain. EXAM: CT ABDOMEN AND PELVIS WITH CONTRAST TECHNIQUE: Multidetector CT imaging of the abdomen and pelvis was performed using the standard protocol following bolus administration of intravenous contrast. CONTRAST:  124mL ISOVUE-300 IOPAMIDOL (ISOVUE-300) INJECTION 61% COMPARISON:  04/04/2014 FINDINGS: Scan was performed in the late arterial phase; advantageous for evaluating bladder mass. Lower chest and abdominal wall: Probable prior right inguinal hernia repair. Biventricular ICD/ pacer. Left ventricular thinning and subendocardial low density from previous infarction. Hepatobiliary: No focal liver abnormality.Cholecystectomy. Negative common bile duct. Pancreas: Unremarkable. Spleen: Unremarkable. Adrenals/Urinary Tract: Negative adrenals. No hydronephrosis or stone. Bilateral renal cortical cysts, measuring up to 69 mm from the interpolar left kidney. This cyst has a thin benign-appearing septation. High-density mass from the left aspect of the bladder fundus which appears centered in the wall, but also exophytic into the lumen, measuring approximately 4 cm. High-density mainly attributed to avid enhancement, although there could also be superimposed mineralization. No concerning pelvic lymph nodes. The remainder of the bladder wall is thickened, nonspecific. Small volume pneumaturia, with presumed recent sampling. Stomach/Bowel: Moderate volume of stool without obstruction or rectal impaction. Clip seen in the sigmoid colon. No appendicitis. Reproductive:There is asymmetric heterogeneous  enhancement fullness of the right prostate gland. Vascular/Lymphatic: Extensive atherosclerosis of the aorta and branch vessels. Replaced right hepatic artery. No acute vascular finding. There is retroperitoneal edema that is likely from volume overload. Other: No ascites or pneumoperitoneum. Musculoskeletal: No acute abnormalities. No sclerotic or lytic bone lesion. IMPRESSION: 1. 4 cm bladder mass consistent with urothelial carcinoma. The mass has a  transmural appearance. No evidence of metastatic disease. 2. Asymmetric enhancement and fullness of the right prostate concerning for carcinoma. Please correlate with PSA. 3. History of urinary retention. Currently there is physiologic distension of the bladder. Nonspecific bladder wall thickening, correlate with urinalysis. 4. Retroperitoneal edema, likely volume overload. Electronically Signed   By: Monte Fantasia M.D.   On: 11/16/2015 23:05    EKG: Independently reviewed.  Ventricularly paced rhythm with rate 70  Assessment/Plan Principal Problem:   Hyponatremia Active Problems:   Hypertension   GERD (gastroesophageal reflux disease)   Atrial fibrillation-Permanent   Chronic anticoagulation   Congestive heart failure, NYHA class III (HCC)   UTI (urinary tract infection) due to Enterococcus   Elevated PSA   Bladder mass   Hyponatremia  -Etiology appears to be euvolemic hyponatremia; CT was read as possible volume overload but there is no physical examination evidence of this at this time -This likely evolved as a result of excessive oral fluid intake followed by instructions from his doctor to drink at least 64 ounces of water per day (water intoxication) -He may also have had a transient increase in ADH secretion post-operatively -SIADH is also a consideration, particularly in light of the abnormalities seen on CT -Will check urinary and serum Osm as well as urinary sodium and potassium levels -Because the patient is asymptomatic, it is  unlikely that this extent of hyponatremia developed overnight; instead this is likely chronic in nature -Will treat with fluid restriction of 1L day -This should be adjusted based on UOP with goal fluid intake of 500 cc less per day than his total urinary output -Will monitor sodium q8h to attempt to avoid overcorrection (>22mEq/L/day) so as to avoid central pontine myelinolysis -If fluid restriction is unsuccessful, would need to consider vaptan therapy  UTI -Recent UTI was Enterococcus which was sensitive to Vanc, PCN, and Nitrofurantoin.   -Given Rocephin in ER  -Will continue Rocephin for now; would suggest asking the lab in the AM to test sensitivity for this medication -Will reculture tonight's urine (ordered by ER)  HTN -Continue home Lisinopril and Coreg -Continue Imdur  GERD -Continue home Prilosec (protonix substituted)  Afib -On Jantoven for anticoagulation -Pharmacy to dose  CHF -On ACE/BB -Hold diuretic for now -EF 30-35% with grade 3 diastolic dysfunction  Elevated PSA -patient with h/o elevated PSA -Now with prostate mass concerning for malignancy on CT -Will recheck PSA -Patient prefers to see his urology at [facility], Dr. Tresa Endo, either as inpatient or outpatient  Bladder mass -As above, patient likely would prefer to see his urologist.  -Suggest that daytime doctor attempt to contact Dr. Rosana Hoes to determine if inpatient transfer is warranted or if outpatient f/u is appropriate   DVT prophylaxis: Full anticoagulation Code Status: DNR - confirmed with patient Family Communication: None  (Daughter: Faith nance [id number]) Disposition Plan:  Home once clinically improved vs. Transfer to [facility] Consults called: None Admission status: Admit, telemetry   Karmen Bongo MD [facility]  If 7PM-7AM, please contact night-coverage [email or web address] Password [facility]  11/17/2015, 12:53 AM

## 2015-11-16 NOTE — ED Notes (Signed)
CRITICAL VALUE ALERT  Critical value received:  Sodium 118  Date of notification:  11/16/2015  Time of notification:  2303  Critical value read back:Yes.    Nurse who received alert:  Fabio Neighbors RN  MD notified (1st page):  Dr. Dorthula Matas  Time of first page:  2304

## 2015-11-16 NOTE — ED Notes (Signed)
Dr. Lorin Mercy in with pt at this time

## 2015-11-16 NOTE — ED Notes (Addendum)
Patient states he was put on antibiotics Friday for UTI. States he saw his PCP today and was told if he didn't void in 5 hours to go to the ER for ultrasound of his bladder. Complaining of pain to lower abdomen at triage. Patient also states he tripped over a throw rug at 1630 and fell against "a bar." Patient states he hit his back and is complaining of back pain since fall.

## 2015-11-16 NOTE — ED Provider Notes (Signed)
CSN: [id number]     Arrival date & time 11/16/15  1731 History  By signing my name below, I, Georgette Shell, attest that this documentation has been prepared under the direction and in the presence of Julianne Rice, MD. Electronically Signed: Georgette Shell, ED Scribe. 11/16/2015. 9:23 PM.   Chief Complaint  Patient presents with  . Dysuria  . Back Pain  . Fall   The history is provided by the patient. No language interpreter was used.    HPI Comments: [name] is a 75 y.o. male who presents to the Emergency Department complaining of Difficulty urinating. Pt was started on antibiotics on Friday 11/13/15 for a UTI. Pt went to see his PCP today and was told if he didn't void in 5 hours he should go to the ED. Pt also has associated fever and mild abdominal pain. Pt complains of left-sided flank pain s/p fall at 1630 today. Pt states he fell against "a bar".  No focal weakness or numbness. Denies any head or neck injury. No loss of consciousness.  Past Medical History  Diagnosis Date  . Ischemic cardiomyopathy     a. 06/2012 EF 25% by echo.  . Hypertension   . Hyperlipidemia   . GERD (gastroesophageal reflux disease)   . Atrial flutter ([place])     a. 01/2011 s/p unsuccessful RFCA complicated by CHB req PPM.  . CHB (complete heart block) ([place])     a. 01/2011: in setting of RFCA, s/p SJM 2110 [employer] DC PPM, ser # [id number].  [name] Kitchen Kidney cysts     a. bilateral  . Hiatal hernia     a. 03/01/2013 EGD and esoph dil.  [name] Kitchen BPH (benign prostatic hyperplasia)     a. elevated PSA  . A-fib (H[facility])     a. chronic coumadin  . Elevated PSA, less than 10 ng/ml   . Fibromyalgia   . Skin cancer     'above left eyebrow; tip of my nose" (08/29/2012)  . Depression     "since 1986-1987" (08/29/2012)  . Anxiety   . Panic attacks   . Chronic systolic CHF (congestive heart failure), NYHA class 2 ([place])     a. 06/2012 Echo: EF 25%, distal sept and apical AK, sev dil LV, mild LVH, mod dil LA.  [name] Kitchen Pacemaker     explanted  06/15/15 >>> CRT-D STJ device, Dr. Caryl Comes  . CAD (coronary artery disease)     a. 1991 s/p CABGx4;  b. redo CABG in 2007 (SVG->OM, VG->PDA->PLV);  c. 01/2011 Cath: LM 80ost, LAD 100p, LCX 100ost, OM1 100, RCA 100, LIMA->LAD ok, VG->PDA ok, VG->OM ok;  d. 03/2012 MV: EF 32% inf & inflat scar w/o ischemia; e. Lexi MV: EF 33%, inf infarct, no ischemia. f. lexiscan 06/12/2014 RCA scar, no ischemia, EF 26%   Past Surgical History  Procedure Laterality Date  . Cardiac catheterization  10/30/2009    Grafts patent. Mild to moderate LV dysfunction. EF 45%. Managed medically.   . Cardiac catheterization      multiple  . Tonsillectomy  1960  . Cholecystectomy  2007  . Insert / replace / remove pacemaker  01/2011    initial placement  . Inguinal hernia repair  1960's    "left I think"  . Cataract extraction w/ intraocular lens  implant, bilateral  2012  . Cardioversion  05/25/2012    Procedure: CARDIOVERSION;  Surgeon: Darlin Coco, MD;  Location: [facility] ENDOSCOPY;  Service: Cardiovascular;  Laterality: N/A;  . Humerus surgery  Left 1960    "growth on the bone taken off; not cancer" (08/29/2012)  . Excisional hemorrhoidectomy  ~ 1980  . Skin cancer excision      'above left eyebrow; tip of my nose" (08/29/2012)  . Atrial flutter ablation  01/2011    "didn't work" (08/29/2012)  . Coronary artery bypass graft  08/27/1989    CABG X 5  . Coronary artery bypass graft  06/2005    CABG X 3  . Cardiac catheterization N/A 04/28/2015    Procedure: Left Heart Cath and Cors/Grafts Angiography;  Surgeon: Belva Crome, MD;  Location: [place] [facility];  Service: Cardiovascular;  Laterality: N/A;  . Ep implantable device N/A 06/15/2015    STJ, CRT-D, Dr. Caryl Comes  . Inguinal hernia repair Right 10/12/2015    Procedure: RIGHT INGUINAL HERNIA REPAIR WITH MESH;  Surgeon: Jackolyn Confer, MD;  Location: [place];  Service: General;  Laterality: Right;  . Insertion of mesh Right 10/12/2015    Procedure: INSERTION OF MESH, RIGHT  INGUINAL HERNIA;  Surgeon: Jackolyn Confer, MD;  Location: [place];  Service: General;  Laterality: Right;   Family History  Problem Relation Age of Onset  . Stroke Mother   . Heart attack Father     died @ 93, mult MI's.  [name] Kitchen Heart disease Father   . Hypertension Father   . Diabetes Sister    Social History  Substance Use Topics  . Smoking status: Former Smoker -- 0.00 packs/day for 0 years    Types: Cigarettes    Quit date: 09/21/1973  . Smokeless tobacco: Never Used  . Alcohol Use: No    Review of Systems  Constitutional: Positive for fever, chills and fatigue.  Respiratory: Negative for cough and shortness of breath.   Cardiovascular: Negative for chest pain and leg swelling.  Gastrointestinal: Positive for abdominal pain. Negative for nausea and diarrhea.  Genitourinary: Positive for dysuria, frequency, hematuria, flank pain and difficulty urinating. Negative for penile swelling and scrotal swelling.  Musculoskeletal: Positive for myalgias and back pain. Negative for neck pain and neck stiffness.  Skin: Negative for rash and wound.  Neurological: Negative for dizziness, syncope, weakness, light-headedness, numbness and headaches.  All other systems reviewed and are negative.     Allergies  Hydrocodone-acetaminophen; Prednisone; Sulfa drugs cross reactors; Ultram; Xarelto; and Zithromax  Home Medications   Prior to Admission medications   Medication Sig Start Date End Date Taking? Authorizing Provider  acetaminophen (TYLENOL) 500 MG tablet Take 500 mg by mouth every 8 (eight) hours as needed for mild pain.    Yes Historical Provider, MD  ALPRAZolam Duanne Moron) 1 MG tablet Take 1 tablet (1 mg total) by mouth 3 (three) times daily as needed. For anxiety 11/12/15  Yes Eustaquio Maize, MD  carvedilol (COREG) 6.25 MG tablet Take 1 tablet (6.25 mg total) by mouth 2 (two) times daily. 11/06/15  Yes Minus Breeding, MD  cephALEXin (KEFLEX) 500 MG capsule Take 1 capsule (500 mg total) by  mouth 4 (four) times daily. 11/13/15  Yes Fransisca Kaufmann Dettinger, MD  doxepin (SINEQUAN) 25 MG capsule Take 25 mg (1 capsule) by mouth in the morning and 50 mg (2 capsules) by mouth at night. Patient taking differently: Take 25-50 mg by mouth 2 (two) times daily. Take 25 mg (1 capsule) by mouth in the morning and 50 mg (2 capsules) by mouth at night. 11/12/15  Yes Eustaquio Maize, MD  fluticasone (FLONASE) 50 MCG/ACT nasal spray Place 2 sprays into  both nostrils daily. 04/30/15  Yes Sharion Balloon, FNP  furosemide (LASIX) 20 MG tablet Take 1 tablet (20 mg total) by mouth daily as needed for fluid. One tablet by mouth daily as needed for swelling 11/12/15  Yes Eustaquio Maize, MD  gabapentin (NEURONTIN) 100 MG capsule Take 100 mg by mouth 4 (four) times daily.   Yes Historical Provider, MD  guaiFENesin (MUCINEX) 600 MG 12 hr tablet Take 600 mg by mouth 2 (two) times daily. Reported on 11/12/2015   Yes Historical Provider, MD  hydroxypropyl methylcellulose / hypromellose (ISOPTO TEARS / GONIOVISC) 2.5 % ophthalmic solution Place 1 drop into both eyes 3 (three) times daily as needed for dry eyes.   Yes Historical Provider, MD  isosorbide mononitrate (IMDUR) 60 MG 24 hr tablet Take 1 tablet (60 mg total) by mouth daily. 11/06/15  Yes Minus Breeding, MD  lisinopril (PRINIVIL,ZESTRIL) 5 MG tablet Take 1 tablet (5 mg total) by mouth every other day. 11/13/15  Yes Minus Breeding, MD  loratadine (CLARITIN) 10 MG tablet Take 10 mg by mouth daily.   Yes Historical Provider, MD  Multiple Vitamins-Minerals (PRESERVISION AREDS 2) CAPS Take 2 capsules by mouth daily. Reported on 06/26/2015   Yes Historical Provider, MD  nitroGLYCERIN (NITROSTAT) 0.4 MG SL tablet DISSOLVE ONE TABLET UNDER THE TONGUE EVERY 5 MINUTES AS NEEDED FOR CHEST PAIN.  DO NOT EXCEED A TOTAL OF 3 DOSES IN 15 MINUTES 05/11/15  Yes Darlin Coco, MD  omeprazole (PRILOSEC) 20 MG capsule Take 1 capsule (20 mg total) by mouth 2 (two) times daily. 03/25/13  Yes  Darlin Coco, MD  polyethylene glycol (MIRALAX / GLYCOLAX) packet Take 17 g by mouth daily.    Yes Historical Provider, MD  rosuvastatin (CRESTOR) 10 MG tablet Take 1 tablet (10 mg total) by mouth daily. 03/25/15  Yes Darlin Coco, MD  warfarin (JANTOVEN) 5 MG tablet Take 5 mg by mouth every evening.   Yes Historical Provider, MD   BP 134/78 mmHg  Pulse 78  Temp(Src) 98.2 F (36.8 C) (Oral)  Resp 17  Ht 5\' 11"  (1.803 m)  Wt 188 lb (85.276 kg)  BMI 26.23 kg/m2  SpO2 99% Physical Exam  Constitutional: He is oriented to person, place, and time. He appears well-developed and well-nourished. No distress.  HENT:  Head: Normocephalic and atraumatic.  Mouth/Throat: Oropharynx is clear and moist. No oropharyngeal exudate.  Eyes: EOM are normal. Pupils are equal, round, and reactive to light.  Neck: Normal range of motion. Neck supple.  No posterior midline cervical tenderness to palpation.  Cardiovascular: Normal rate and regular rhythm.  Exam reveals no gallop and no friction rub.   No murmur heard. Pulmonary/Chest: Effort normal and breath sounds normal. No respiratory distress. He has no wheezes. He has no rales. He exhibits no tenderness.  Abdominal: Soft. Bowel sounds are normal. He exhibits no distension and no mass. There is no tenderness. There is no rebound and no guarding.  Musculoskeletal: Normal range of motion. He exhibits tenderness. He exhibits no edema.  Left flank tenderness with percussion. No midline thoracic or lumbar tenderness.  Neurological: He is alert and oriented to person, place, and time.  Skin: Skin is warm and dry. No rash noted. No erythema.  Psychiatric: He has a normal mood and affect. His behavior is normal.  Nursing note and vitals reviewed.   ED Course  Procedures  DIAGNOSTIC STUDIES: Oxygen Saturation is 100% on RA, normal by my interpretation.    COORDINATION OF CARE:  8:51 PM Discussed treatment plan with pt at bedside which includes  abdomen CT and pt agreed to plan.  Labs Review Labs Reviewed  URINALYSIS, ROUTINE W REFLEX MICROSCOPIC (NOT AT [facility]) - Abnormal; Notable for the following:    Hgb urine dipstick LARGE (*)    Ketones, ur TRACE (*)    Protein, ur 100 (*)    Leukocytes, UA TRACE (*)    All other components within normal limits  URINE MICROSCOPIC-ADD ON - Abnormal; Notable for the following:    Squamous Epithelial / LPF 0-5 (*)    Bacteria, UA MANY (*)    All other components within normal limits  CBC WITH DIFFERENTIAL/PLATELET - Abnormal; Notable for the following:    RBC 4.06 (*)    Hemoglobin 12.2 (*)    HCT 36.2 (*)    Platelets 125 (*)    Neutro Abs 7.8 (*)    Monocytes Absolute 1.1 (*)    All other components within normal limits  COMPREHENSIVE METABOLIC PANEL - Abnormal; Notable for the following:    Sodium 118 (*)    Chloride 88 (*)    Glucose, Bld 110 (*)    Calcium 7.9 (*)    Total Protein 6.4 (*)    Total Bilirubin 1.7 (*)    Anion gap 4 (*)    All other components within normal limits  URINE CULTURE    Imaging Review Ct Abdomen Pelvis W Contrast  11/16/2015  CLINICAL DATA:  Difficulty urinating.  Left flank pain. EXAM: CT ABDOMEN AND PELVIS WITH CONTRAST TECHNIQUE: Multidetector CT imaging of the abdomen and pelvis was performed using the standard protocol following bolus administration of intravenous contrast. CONTRAST:  152mL ISOVUE-300 IOPAMIDOL (ISOVUE-300) INJECTION 61% COMPARISON:  04/04/2014 FINDINGS: Scan was performed in the late arterial phase; advantageous for evaluating bladder mass. Lower chest and abdominal wall: Probable prior right inguinal hernia repair. Biventricular ICD/ pacer. Left ventricular thinning and subendocardial low density from previous infarction. Hepatobiliary: No focal liver abnormality.Cholecystectomy. Negative common bile duct. Pancreas: Unremarkable. Spleen: Unremarkable. Adrenals/Urinary Tract: Negative adrenals. No hydronephrosis or stone. Bilateral  renal cortical cysts, measuring up to 69 mm from the interpolar left kidney. This cyst has a thin benign-appearing septation. High-density mass from the left aspect of the bladder fundus which appears centered in the wall, but also exophytic into the lumen, measuring approximately 4 cm. High-density mainly attributed to avid enhancement, although there could also be superimposed mineralization. No concerning pelvic lymph nodes. The remainder of the bladder wall is thickened, nonspecific. Small volume pneumaturia, with presumed recent sampling. Stomach/Bowel: Moderate volume of stool without obstruction or rectal impaction. Clip seen in the sigmoid colon. No appendicitis. Reproductive:There is asymmetric heterogeneous enhancement fullness of the right prostate gland. Vascular/Lymphatic: Extensive atherosclerosis of the aorta and branch vessels. Replaced right hepatic artery. No acute vascular finding. There is retroperitoneal edema that is likely from volume overload. Other: No ascites or pneumoperitoneum. Musculoskeletal: No acute abnormalities. No sclerotic or lytic bone lesion. IMPRESSION: 1. 4 cm bladder mass consistent with urothelial carcinoma. The mass has a transmural appearance. No evidence of metastatic disease. 2. Asymmetric enhancement and fullness of the right prostate concerning for carcinoma. Please correlate with PSA. 3. History of urinary retention. Currently there is physiologic distension of the bladder. Nonspecific bladder wall thickening, correlate with urinalysis. 4. Retroperitoneal edema, likely volume overload. Electronically Signed   By: Monte Fantasia M.D.   On: 11/16/2015 23:05   I have personally reviewed and evaluated these images and lab results  as part of my medical decision-making.   EKG Interpretation None      MDM   Final diagnoses:  UTI (lower urinary tract infection)  Urinary retention  Hyponatremia  Prostate mass  Bladder mass    I personally performed the  services described in this documentation, which was scribed in my presence. The recorded information has been reviewed and is accurate.   Patient admits to drinking greater than 64 ounces of water daily. Hyponatremic. Also CT significant for prostate and bladder masses. Concern for carcinoma. Will start on IV Rocephin. Will place foley catheter in the emergency department. Discussed with hospitalist and will admit.  Julianne Rice, MD 11/16/15 [phone]

## 2015-11-16 NOTE — Progress Notes (Signed)
    Subjective:    Patient ID: [name], male    DOB: 02/21/1941, 75 y.o.   MRN: [id number]  CC: Fever   HPI: [name] is a 75 y.o. male presenting for Fever  Up to 101 yesterday No fevers today Having some muscle fatigue as well Seen in clinic 7/14 for dysuria Started on keflex Had some muscle aches over the weekend Feeling better today Says he has been drinking large amounts of water Is not sure he is urinating enough out [place] he urinated "100ccs" this morning about two hours ago Was not able to leave a urine sample as he came in today Does feel some pressure Recently with urinary retention requiring foley cath at discharge for hernia surgery Still with some dysuria   Depression screen [facility] 2/9 11/16/2015 11/13/2015 11/12/2015 11/04/2015 10/20/2015  Decreased Interest 0 0 0 0 0  Down, Depressed, Hopeless 0 0 0 0 0  PHQ - 2 Score 0 0 0 0 0     Relevant past medical, surgical, family and social history reviewed and updated as indicated.  Interim medical history since our last visit reviewed. Allergies and medications reviewed and updated.  ROS: Per HPI unless specifically indicated above  History  Smoking status  . Former Smoker -- 0.00 packs/day for 0 years  . Types: Cigarettes  . Quit date: 09/21/1973  Smokeless tobacco  . Never Used       Objective:    BP 115/66 mmHg  Pulse 73  Temp(Src) 98.3 F (36.8 C) (Oral)  Ht 5\' 11"  (1.803 m)  Wt 188 lb 9.6 oz (85.548 kg)  BMI 26.32 kg/m2  Wt Readings from Last 3 Encounters:  11/17/15 188 lb 1.6 oz (85.322 kg)  11/16/15 188 lb 9.6 oz (85.548 kg)  11/13/15 185 lb (83.915 kg)    Gen: NAD, alert, cooperative with exam, NCAT EYES: EOMI, no scleral injection or icterus CV: NRRR, normal S1/S2, no murmur, distal pulses 2+ b/l Resp: CTABL, no wheezes, normal WOB Abd: +BS, soft, lower abdomen with some firmness, pt with hard time relaxing abd muscles in all positions, no guarding, non-tender.  Ext: No  edema, warm Neuro: Alert and oriented     Assessment & Plan:    Gilles was seen today for follow up recent UTI diagnosis.  Diagnoses and all orders for this visit:  Difficulty urinating Was able to get a 4-5 sec stream of urine in clinic when he tried again Problems with urinating following recent surgery If continues to have difficulty urinating needs to go to ED Not able to in and out cath or bladder scan here  Acute cystitis with hematuria Started on keflex last week, fever curve improved Will wait culture data  Fever, unspecified   Follow up plan: As needed  Assunta Found, MD [place] 11/17/2015, 8:51 AM

## 2015-11-17 ENCOUNTER — Encounter (HOSPITAL_COMMUNITY): Payer: Self-pay

## 2015-11-17 DIAGNOSIS — C679 Malignant neoplasm of bladder, unspecified: Secondary | ICD-10-CM | POA: Diagnosis present

## 2015-11-17 DIAGNOSIS — N390 Urinary tract infection, site not specified: Secondary | ICD-10-CM | POA: Diagnosis present

## 2015-11-17 DIAGNOSIS — N3289 Other specified disorders of bladder: Secondary | ICD-10-CM

## 2015-11-17 DIAGNOSIS — F410 Panic disorder [episodic paroxysmal anxiety] without agoraphobia: Secondary | ICD-10-CM | POA: Diagnosis present

## 2015-11-17 DIAGNOSIS — M797 Fibromyalgia: Secondary | ICD-10-CM | POA: Diagnosis present

## 2015-11-17 DIAGNOSIS — Z8249 Family history of ischemic heart disease and other diseases of the circulatory system: Secondary | ICD-10-CM | POA: Diagnosis not present

## 2015-11-17 DIAGNOSIS — Z9049 Acquired absence of other specified parts of digestive tract: Secondary | ICD-10-CM | POA: Diagnosis not present

## 2015-11-17 DIAGNOSIS — D696 Thrombocytopenia, unspecified: Secondary | ICD-10-CM | POA: Diagnosis present

## 2015-11-17 DIAGNOSIS — E861 Hypovolemia: Secondary | ICD-10-CM | POA: Diagnosis present

## 2015-11-17 DIAGNOSIS — Z961 Presence of intraocular lens: Secondary | ICD-10-CM | POA: Diagnosis present

## 2015-11-17 DIAGNOSIS — I482 Chronic atrial fibrillation: Secondary | ICD-10-CM | POA: Diagnosis present

## 2015-11-17 DIAGNOSIS — B952 Enterococcus as the cause of diseases classified elsewhere: Secondary | ICD-10-CM | POA: Diagnosis present

## 2015-11-17 DIAGNOSIS — E785 Hyperlipidemia, unspecified: Secondary | ICD-10-CM | POA: Diagnosis present

## 2015-11-17 DIAGNOSIS — I255 Ischemic cardiomyopathy: Secondary | ICD-10-CM | POA: Diagnosis present

## 2015-11-17 DIAGNOSIS — Z7901 Long term (current) use of anticoagulants: Secondary | ICD-10-CM | POA: Diagnosis not present

## 2015-11-17 DIAGNOSIS — E8779 Other fluid overload: Secondary | ICD-10-CM | POA: Diagnosis present

## 2015-11-17 DIAGNOSIS — K219 Gastro-esophageal reflux disease without esophagitis: Secondary | ICD-10-CM | POA: Diagnosis present

## 2015-11-17 DIAGNOSIS — E871 Hypo-osmolality and hyponatremia: Secondary | ICD-10-CM | POA: Diagnosis not present

## 2015-11-17 DIAGNOSIS — Z823 Family history of stroke: Secondary | ICD-10-CM | POA: Diagnosis not present

## 2015-11-17 DIAGNOSIS — I2510 Atherosclerotic heart disease of native coronary artery without angina pectoris: Secondary | ICD-10-CM | POA: Diagnosis present

## 2015-11-17 DIAGNOSIS — I110 Hypertensive heart disease with heart failure: Secondary | ICD-10-CM | POA: Diagnosis present

## 2015-11-17 DIAGNOSIS — R972 Elevated prostate specific antigen [PSA]: Secondary | ICD-10-CM | POA: Diagnosis present

## 2015-11-17 DIAGNOSIS — R339 Retention of urine, unspecified: Secondary | ICD-10-CM | POA: Diagnosis present

## 2015-11-17 DIAGNOSIS — I5022 Chronic systolic (congestive) heart failure: Secondary | ICD-10-CM | POA: Diagnosis present

## 2015-11-17 DIAGNOSIS — Z85828 Personal history of other malignant neoplasm of skin: Secondary | ICD-10-CM | POA: Diagnosis not present

## 2015-11-17 DIAGNOSIS — E222 Syndrome of inappropriate secretion of antidiuretic hormone: Secondary | ICD-10-CM | POA: Diagnosis present

## 2015-11-17 DIAGNOSIS — Z833 Family history of diabetes mellitus: Secondary | ICD-10-CM | POA: Diagnosis not present

## 2015-11-17 DIAGNOSIS — G4700 Insomnia, unspecified: Secondary | ICD-10-CM | POA: Diagnosis present

## 2015-11-17 DIAGNOSIS — Z951 Presence of aortocoronary bypass graft: Secondary | ICD-10-CM | POA: Diagnosis not present

## 2015-11-17 HISTORY — DX: Hypo-osmolality and hyponatremia: E87.1

## 2015-11-17 HISTORY — DX: Other specified disorders of bladder: N32.89

## 2015-11-17 LAB — PROTIME-INR
INR: 2.24 — ABNORMAL HIGH (ref 0.00–1.49)
PROTHROMBIN TIME: 24.6 s — AB (ref 11.6–15.2)

## 2015-11-17 LAB — BASIC METABOLIC PANEL
Anion gap: 7 (ref 5–15)
Anion gap: 4 — ABNORMAL LOW (ref 5–15)
Anion gap: 2 — ABNORMAL LOW (ref 5–15)
BUN: 16 mg/dL (ref 6–20)
BUN: 15 mg/dL (ref 6–20)
BUN: 19 mg/dL (ref 6–20)
CALCIUM: 8.1 mg/dL — AB (ref 8.9–10.3)
CALCIUM: 8 mg/dL — AB (ref 8.9–10.3)
CO2: 27 mmol/L (ref 22–32)
CO2: 25 mmol/L (ref 22–32)
CO2: 27 mmol/L (ref 22–32)
CREATININE: 0.91 mg/dL (ref 0.61–1.24)
CREATININE: 0.75 mg/dL (ref 0.61–1.24)
CREATININE: 0.94 mg/dL (ref 0.61–1.24)
Calcium: 7.8 mg/dL — ABNORMAL LOW (ref 8.9–10.3)
Chloride: 89 mmol/L — ABNORMAL LOW (ref 101–111)
Chloride: 93 mmol/L — ABNORMAL LOW (ref 101–111)
Chloride: 93 mmol/L — ABNORMAL LOW (ref 101–111)
GFR calc Af Amer: >60 mL/min (ref >60)
GFR calc Af Amer: >60 mL/min (ref >60)
GFR calc Af Amer: >60 mL/min (ref >60)
Glucose, Bld: 94 mg/dL (ref 65–99)
Glucose, Bld: 178 mg/dL — ABNORMAL HIGH (ref 65–99)
Glucose, Bld: 139 mg/dL — ABNORMAL HIGH (ref 65–99)
POTASSIUM: 4.5 mmol/L (ref 3.5–5.1)
POTASSIUM: 4.4 mmol/L (ref 3.5–5.1)
Potassium: 4.7 mmol/L (ref 3.5–5.1)
SODIUM: 123 mmol/L — AB (ref 135–145)
SODIUM: 122 mmol/L — AB (ref 135–145)
SODIUM: 122 mmol/L — AB (ref 135–145)

## 2015-11-17 LAB — CBC
HCT: 34.8 % — ABNORMAL LOW (ref 39.0–52.0)
Hemoglobin: 11.9 g/dL — ABNORMAL LOW (ref 13.0–17.0)
MCH: 30.4 pg (ref 26.0–34.0)
MCHC: 34.2 g/dL (ref 30.0–36.0)
MCV: 89 fL (ref 78.0–100.0)
PLATELETS: 112 10*3/uL — AB (ref 150–400)
RBC: 3.91 MIL/uL — ABNORMAL LOW (ref 4.22–5.81)
RDW: 14.1 % (ref 11.5–15.5)
WBC: 10.1 10*3/uL (ref 4.0–10.5)

## 2015-11-17 LAB — NA AND K (SODIUM & POTASSIUM), RAND UR
Potassium Urine: 23 mmol/L
Sodium, Ur: 13 mmol/L

## 2015-11-17 LAB — OSMOLALITY: OSMOLALITY: 266 mosm/kg — AB (ref 275–295)

## 2015-11-17 LAB — PSA: PSA: 15.27 ng/mL — AB (ref 0.00–4.00)

## 2015-11-17 LAB — OSMOLALITY, URINE: Osmolality, Ur: 255 mOsm/kg — ABNORMAL LOW (ref 300–900)

## 2015-11-17 MED ORDER — GABAPENTIN 100 MG PO CAPS
100.0000 mg | ORAL_CAPSULE | Freq: Four times a day (QID) | ORAL | Status: DC
  Administered 2015-11-17 – 2015-11-20 (×12): 100 mg via ORAL
  Filled 2015-11-17 (×12): qty 1

## 2015-11-17 MED ORDER — SODIUM CHLORIDE 0.9% FLUSH
3.0000 mL | Freq: Two times a day (BID) | INTRAVENOUS | Status: DC
  Administered 2015-11-17 – 2015-11-19 (×5): 3 mL via INTRAVENOUS

## 2015-11-17 MED ORDER — ACETAMINOPHEN 650 MG RE SUPP: 650.0000 mg | Freq: Four times a day (QID) | RECTAL | Status: DC | PRN

## 2015-11-17 MED ORDER — DOXEPIN HCL 25 MG PO CAPS
25.0000 mg | ORAL_CAPSULE | Freq: Every morning | ORAL | Status: DC
  Administered 2015-11-17 – 2015-11-20 (×4): 25 mg via ORAL
  Filled 2015-11-17 (×4): qty 1

## 2015-11-17 MED ORDER — ISOSORBIDE MONONITRATE ER 60 MG PO TB24
60.0000 mg | ORAL_TABLET | Freq: Every day | ORAL | Status: DC
  Administered 2015-11-17 – 2015-11-20 (×4): 60 mg via ORAL
  Filled 2015-11-17 (×4): qty 1

## 2015-11-17 MED ORDER — POLYVINYL ALCOHOL 1.4 % OP SOLN: 1.0000 [drp] | Freq: Three times a day (TID) | OPHTHALMIC | Status: DC | PRN

## 2015-11-17 MED ORDER — WARFARIN - PHARMACIST DOSING INPATIENT
Freq: Every day | Status: DC
  Administered 2015-11-17 – 2015-11-18 (×2)

## 2015-11-17 MED ORDER — ONDANSETRON HCL 4 MG PO TABS: 4.0000 mg | ORAL_TABLET | Freq: Four times a day (QID) | ORAL | Status: DC | PRN

## 2015-11-17 MED ORDER — GI COCKTAIL ~~LOC~~
30.0000 mL | Freq: Once | ORAL | Status: AC
  Administered 2015-11-17: 30 mL via ORAL
  Filled 2015-11-17: qty 30

## 2015-11-17 MED ORDER — SODIUM CHLORIDE 0.9 % IV SOLN: 250.0000 mL | INTRAVENOUS | Status: DC | PRN

## 2015-11-17 MED ORDER — ROSUVASTATIN CALCIUM 10 MG PO TABS
10.0000 mg | ORAL_TABLET | Freq: Every day | ORAL | Status: DC
  Administered 2015-11-17 – 2015-11-20 (×4): 10 mg via ORAL
  Filled 2015-11-17 (×4): qty 1

## 2015-11-17 MED ORDER — LISINOPRIL 5 MG PO TABS
5.0000 mg | ORAL_TABLET | ORAL | Status: DC
  Administered 2015-11-18 – 2015-11-20 (×2): 5 mg via ORAL
  Filled 2015-11-17 (×2): qty 1

## 2015-11-17 MED ORDER — DOCUSATE SODIUM 100 MG PO CAPS
100.0000 mg | ORAL_CAPSULE | Freq: Two times a day (BID) | ORAL | Status: DC
  Administered 2015-11-17 – 2015-11-19 (×6): 100 mg via ORAL
  Filled 2015-11-17 (×6): qty 1

## 2015-11-17 MED ORDER — CARVEDILOL 3.125 MG PO TABS
6.2500 mg | ORAL_TABLET | Freq: Two times a day (BID) | ORAL | Status: DC
  Administered 2015-11-17 – 2015-11-19 (×6): 6.25 mg via ORAL
  Filled 2015-11-17 (×7): qty 2

## 2015-11-17 MED ORDER — SODIUM CHLORIDE 0.9 % IV SOLN
INTRAVENOUS | Status: DC
  Administered 2015-11-17: 16:00:00 via INTRAVENOUS

## 2015-11-17 MED ORDER — NON FORMULARY: 5.0000 mg | Status: DC

## 2015-11-17 MED ORDER — POLYETHYLENE GLYCOL 3350 17 G PO PACK
17.0000 g | PACK | Freq: Every day | ORAL | Status: DC
  Administered 2015-11-17 – 2015-11-20 (×4): 17 g via ORAL
  Filled 2015-11-17 (×4): qty 1

## 2015-11-17 MED ORDER — WARFARIN SODIUM 5 MG PO TABS: 5.0000 mg | ORAL_TABLET | Freq: Every evening | ORAL | Status: DC

## 2015-11-17 MED ORDER — DEXTROSE 5 % IV SOLN
1.0000 g | INTRAVENOUS | Status: DC
  Administered 2015-11-17: 1 g via INTRAVENOUS
  Filled 2015-11-17 (×2): qty 10

## 2015-11-17 MED ORDER — DOXEPIN HCL 25 MG PO CAPS: 25.0000 mg | ORAL_CAPSULE | Freq: Two times a day (BID) | ORAL | Status: DC

## 2015-11-17 MED ORDER — ONDANSETRON HCL 4 MG/2ML IJ SOLN: 4.0000 mg | Freq: Four times a day (QID) | INTRAMUSCULAR | Status: DC | PRN

## 2015-11-17 MED ORDER — WARFARIN SODIUM 5 MG PO TABS
5.0000 mg | ORAL_TABLET | Freq: Every evening | ORAL | Status: DC
  Administered 2015-11-17 – 2015-11-19 (×3): 5 mg via ORAL
  Filled 2015-11-17: qty 1

## 2015-11-17 MED ORDER — SODIUM CHLORIDE 0.9% FLUSH: 3.0000 mL | INTRAVENOUS | Status: DC | PRN

## 2015-11-17 MED ORDER — SENNA 8.6 MG PO TABS
1.0000 | ORAL_TABLET | Freq: Two times a day (BID) | ORAL | Status: DC
  Administered 2015-11-17 – 2015-11-20 (×8): 8.6 mg via ORAL
  Filled 2015-11-17 (×8): qty 1

## 2015-11-17 MED ORDER — DOXEPIN HCL 25 MG PO CAPS
50.0000 mg | ORAL_CAPSULE | Freq: Every day | ORAL | Status: DC
  Administered 2015-11-17 – 2015-11-19 (×3): 50 mg via ORAL
  Filled 2015-11-17 (×3): qty 2

## 2015-11-17 MED ORDER — ACETAMINOPHEN 325 MG PO TABS
650.0000 mg | ORAL_TABLET | Freq: Four times a day (QID) | ORAL | Status: DC | PRN
  Administered 2015-11-17 – 2015-11-20 (×3): 650 mg via ORAL
  Filled 2015-11-17 (×3): qty 2

## 2015-11-17 MED ORDER — PANTOPRAZOLE SODIUM 40 MG PO TBEC
40.0000 mg | DELAYED_RELEASE_TABLET | Freq: Every day | ORAL | Status: DC
Start: 2015-11-17 — End: 2015-11-20
  Administered 2015-11-17 – 2015-11-20 (×4): 40 mg via ORAL
  Filled 2015-11-17 (×4): qty 1

## 2015-11-17 MED ORDER — SODIUM CHLORIDE 0.9% FLUSH
3.0000 mL | Freq: Two times a day (BID) | INTRAVENOUS | Status: DC
  Administered 2015-11-17 – 2015-11-20 (×6): 3 mL via INTRAVENOUS

## 2015-11-17 MED ORDER — HYPROMELLOSE (GONIOSCOPIC) 2.5 % OP SOLN
1.0000 [drp] | Freq: Three times a day (TID) | OPHTHALMIC | Status: DC | PRN
  Filled 2015-11-17: qty 15

## 2015-11-17 MED ORDER — ALPRAZOLAM 1 MG PO TABS
1.0000 mg | ORAL_TABLET | Freq: Three times a day (TID) | ORAL | Status: DC | PRN
  Administered 2015-11-17 – 2015-11-20 (×7): 1 mg via ORAL
  Filled 2015-11-17 (×7): qty 1

## 2015-11-17 MED ORDER — OCUVITE-LUTEIN PO CAPS
2.0000 | ORAL_CAPSULE | Freq: Every day | ORAL | Status: DC
  Administered 2015-11-17 – 2015-11-20 (×4): 2 via ORAL
  Filled 2015-11-17 (×9): qty 2

## 2015-11-17 NOTE — Progress Notes (Signed)
ANTICOAGULATION CONSULT NOTE - Initial Consult  Pharmacy Consult for Warfarin (chronic Rx PTA).  Pt states only Ervin Knack works for him  Indication: atrial fibrillation  Allergies  Allergen Reactions  . Hydrocodone-Acetaminophen Itching  . Prednisone Itching  . Sulfa Drugs Cross Reactors Other (See Comments)    unknown  . Ultram [Tramadol Hcl] Itching    "don't remember how bad"  . Xarelto [Rivaroxaban] Other (See Comments)    Itching   . Zithromax [Azithromycin] Itching    Itching     Patient Measurements: Height: 5\' 11"  (180.3 cm) Weight: 188 lb 1.6 oz (85.322 kg) IBW/kg (Calculated) : 75.3  Vital Signs: Temp: 98.2 F (36.8 C) (07/18 0645) Temp Source: Oral (07/18 0645) BP: 95/61 mmHg (07/18 0645) Pulse Rate: 70 (07/18 0645)  Labs:  Recent Labs  11/16/15 2209 11/17/15 0215  HGB 12.2* 11.9*  HCT 36.2* 34.8*  PLT 125* 112*  LABPROT  --  24.6*  INR  --  2.24*  CREATININE 0.93 0.91   Estimated Creatinine Clearance: 74.7 mL/min (by C-G formula based on Cr of 0.91).  Medical History: Past Medical History  Diagnosis Date  . Ischemic cardiomyopathy     a. 06/2012 EF 25% by echo.  . Hypertension   . Hyperlipidemia   . GERD (gastroesophageal reflux disease)   . Atrial flutter ([place])     a. 01/2011 s/p unsuccessful RFCA complicated by CHB req PPM.  . CHB (complete heart block) ([place])     a. 01/2011: in setting of RFCA, s/p SJM 2110 [employer] DC PPM, ser # [id number].  [name] Kitchen Kidney cysts     a. bilateral  . Hiatal hernia     a. 03/01/2013 EGD and esoph dil.  [name] Kitchen BPH (benign prostatic hyperplasia)     a. elevated PSA  . A-fib ([facility])     a. chronic coumadin  . Elevated PSA, less than 10 ng/ml   . Fibromyalgia   . Skin cancer     'above left eyebrow; tip of my nose" (08/29/2012)  . Depression     "since 1986-1987" (08/29/2012)  . Anxiety   . Panic attacks   . Chronic systolic CHF (congestive heart failure), NYHA class 2 ([place])     a. 06/2012 Echo: EF 25%, distal sept and  apical AK, sev dil LV, mild LVH, mod dil LA.  [name] Kitchen Pacemaker     explanted 06/15/15 >>> CRT-D STJ device, Dr. Caryl Comes  . CAD (coronary artery disease)     a. 1991 s/p CABGx4;  b. redo CABG in 2007 (SVG->OM, VG->PDA->PLV);  c. 01/2011 Cath: LM 80ost, LAD 100p, LCX 100ost, OM1 100, RCA 100, LIMA->LAD ok, VG->PDA ok, VG->OM ok;  d. 03/2012 MV: EF 32% inf & inflat scar w/o ischemia; e. Lexi MV: EF 33%, inf infarct, no ischemia. f. lexiscan 06/12/2014 RCA scar, no ischemia, EF 26%   Medications:  Prescriptions prior to admission  Medication Sig Dispense Refill Last Dose  . acetaminophen (TYLENOL) 500 MG tablet Take 500 mg by mouth every 8 (eight) hours as needed for mild pain.    11/16/2015 at 1630  . ALPRAZolam (XANAX) 1 MG tablet Take 1 tablet (1 mg total) by mouth 3 (three) times daily as needed. For anxiety 270 tablet 1 11/16/2015 at Unknown time  . carvedilol (COREG) 6.25 MG tablet Take 1 tablet (6.25 mg total) by mouth 2 (two) times daily. 180 tablet 2 11/16/2015 at 1700  . cephALEXin (KEFLEX) 500 MG capsule Take 1 capsule (500 mg total) by mouth 4 (  four) times daily. 28 capsule 0 11/16/2015 at Unknown time  . doxepin (SINEQUAN) 25 MG capsule Take 25 mg (1 capsule) by mouth in the morning and 50 mg (2 capsules) by mouth at night. (Patient taking differently: Take 25-50 mg by mouth 2 (two) times daily. Take 25 mg (1 capsule) by mouth in the morning and 50 mg (2 capsules) by mouth at night.) 270 capsule 3 11/16/2015 at Unknown time  . fluticasone (FLONASE) 50 MCG/ACT nasal spray Place 2 sprays into both nostrils daily. 16 g 6 11/16/2015 at Unknown time  . furosemide (LASIX) 20 MG tablet Take 1 tablet (20 mg total) by mouth daily as needed for fluid. One tablet by mouth daily as needed for swelling 90 tablet 1 11/13/2015 at Unknown time  . gabapentin (NEURONTIN) 100 MG capsule Take 100 mg by mouth 4 (four) times daily.   11/16/2015 at Unknown time  . guaiFENesin (MUCINEX) 600 MG 12 hr tablet Take 600 mg by mouth 2  (two) times daily. Reported on 11/12/2015   11/16/2015 at Unknown time  . hydroxypropyl methylcellulose / hypromellose (ISOPTO TEARS / GONIOVISC) 2.5 % ophthalmic solution Place 1 drop into both eyes 3 (three) times daily as needed for dry eyes.   Past Week at --------------------------------------------------------------------------------------------------------------------------------  . isosorbide mononitrate (IMDUR) 60 MG 24 hr tablet Take 1 tablet (60 mg total) by mouth daily. 90 tablet 2 11/16/2015 at Unknown time  . lisinopril (PRINIVIL,ZESTRIL) 5 MG tablet Take 1 tablet (5 mg total) by mouth every other day. 90 tablet 3 11/16/2015 at Unknown time  . loratadine (CLARITIN) 10 MG tablet Take 10 mg by mouth daily.   11/16/2015 at Unknown time  . Multiple Vitamins-Minerals (PRESERVISION AREDS 2) CAPS Take 2 capsules by mouth daily. Reported on 06/26/2015   11/16/2015 at Unknown time  . nitroGLYCERIN (NITROSTAT) 0.4 MG SL tablet DISSOLVE ONE TABLET UNDER THE TONGUE EVERY 5 MINUTES AS NEEDED FOR CHEST PAIN.  DO NOT EXCEED A TOTAL OF 3 DOSES IN 15 MINUTES 100 tablet 3 unknown  . omeprazole (PRILOSEC) 20 MG capsule Take 1 capsule (20 mg total) by mouth 2 (two) times daily. 180 capsule 3 11/16/2015 at Unknown time  . polyethylene glycol (MIRALAX / GLYCOLAX) packet Take 17 g by mouth daily.    11/16/2015 at Unknown time  . rosuvastatin (CRESTOR) 10 MG tablet Take 1 tablet (10 mg total) by mouth daily. 90 tablet 3 11/16/2015 at Unknown time  . warfarin (JANTOVEN) 5 MG tablet Take 5 mg by mouth every evening.   11/15/2015 at 2100    Assessment: 75yo male on chronic Warfarin for h/o afib.  Pt states that only [place] works for him.  Pt will use own supply from home as Jantoven not stocked at [facility].  INR is therapeutic on admission.  Goal of Therapy:  INR 2-3 Monitor platelets by anticoagulation protocol: Yes   Plan:  Warfarin 5mg  po today x 1 (pt's home dose) Monitor INR and CBC  Sharda Keddy A 11/17/2015,7:37  AM

## 2015-11-17 NOTE — Care Management Important Message (Signed)
Important Message  Patient Details  Name: JORGEN MERRITT MRN: [id number] Date of Birth: 1940-10-10   Medicare Important Message Given:  Yes    Briant Sites, RN 11/17/2015, 12:44 PM

## 2015-11-17 NOTE — ED Notes (Signed)
Report given to RN on 300 

## 2015-11-17 NOTE — Progress Notes (Signed)
Pharmacy Antibiotic Note  [name] is a 75 y.o. male admitted on 11/16/2015 with UTI.  Pharmacy has been consulted for ROCEPHIN dosing.  Plan: Rocephin 1gm IV q24hrs Monitor labs, progress, c/s  Height: 5\' 11"  (180.3 cm) Weight: 188 lb 1.6 oz (85.322 kg) IBW/kg (Calculated) : 75.3  Temp (24hrs), Avg:98.2 F (36.8 C), Min:98.2 F (36.8 C), Max:98.3 F (36.8 C)   Recent Labs Lab 11/12/15 0835 11/16/15 2209 11/17/15 0215  WBC  --  10.3 10.1  CREATININE 0.91 0.93 0.91    Estimated Creatinine Clearance: 74.7 mL/min (by C-G formula based on Cr of 0.91).    Allergies  Allergen Reactions  . Hydrocodone-Acetaminophen Itching  . Prednisone Itching  . Sulfa Drugs Cross Reactors Other (See Comments)    unknown  . Ultram [Tramadol Hcl] Itching    "don't remember how bad"  . Xarelto [Rivaroxaban] Other (See Comments)    Itching   . Zithromax [Azithromycin] Itching    Itching    Anti-infectives    Start     Dose/Rate Route Frequency Ordered Stop   11/17/15 2100  cefTRIAXone (ROCEPHIN) 1 g in dextrose 5 % 50 mL IVPB     1 g 100 mL/hr over 30 Minutes Intravenous Every 24 hours 11/17/15 0734     11/16/15 2115  cefTRIAXone (ROCEPHIN) 1 g in dextrose 5 % 50 mL IVPB     1 g 100 mL/hr over 30 Minutes Intravenous  Once 11/16/15 2101 11/16/15 2246     Recent Results (from the past 240 hour(s))  Microscopic Examination     Status: Abnormal   Collection Time: 11/13/15 11:46 AM  Result Value Ref Range Status   WBC, UA >30 (A) 0 -  5 /hpf Final   RBC, UA 3-10 (A) 0 -  2 /hpf Final   Epithelial Cells (non renal) None seen 0 - 10 /hpf Final   Bacteria, UA Few None seen/Few Final  Urine culture     Status: Abnormal   Collection Time: 11/13/15  3:16 PM  Result Value Ref Range Status   Urine Culture, Routine Final report (A)  Final   Urine Culture result 1 Enterococcus faecalis (A)  Final    Comment: Greater than 100,000 colony forming units per mL Note: this isolate is  vancomycin-susceptible. This information is provided for epidemiologic purposes only: vancomycin is not among the antibiotics recommended for therapy of urinary tract infections caused by Enterococcus.    RESULT 2 Comment (A)  Final    Comment: Beta hemolytic Streptococcus, group B Greater than 100,000 colony forming units per mL Penicillin and ampicillin are drugs of choice for treatment of beta-hemolytic streptococcal infections. Susceptibility testing of penicillins and other beta-lactam agents approved by the FDA for treatment of beta-hemolytic streptococcal infections need not be performed routinely because nonsusceptible isolates are extremely rare in any beta-hemolytic streptococcus and have not been reported for Streptococcus pyogenes (group A). (CLSI 2011)    ANTIMICROBIAL SUSCEPTIBILITY Comment  Final    Comment:       ** S = Susceptible; I = Intermediate; R = Resistant **                    P = Positive; N = Negative             MICS are expressed in micrograms per mL    Antibiotic                 RSLT#1    RSLT#2  RSLT#3    RSLT#4 Ciprofloxacin                  R Levofloxacin                   R Nitrofurantoin                 S Penicillin                     S Tetracycline                   R Vancomycin                     S    Thank you for allowing pharmacy to be a part of this patient's care.  Hart Robinsons A 11/17/2015 7:35 AM

## 2015-11-17 NOTE — Progress Notes (Signed)
PROGRESS NOTE    [name]  [id number] DOB: 22-Mar-1941 DOA: 11/16/2015 PCP: Eustaquio Maize, MD     Brief Narrative:  75 year old man with a baseline creatinine of complaints of urinary retention. He developed acute urinary retention following inguinal hernia repair 5 weeks ago, just visited the urology office last week at which point his catheter was discontinued. He did well over the weekend and yesterday noticed that he was unable to urinate. On CT scan he was found to have a bladder mass, lab work shows hyponatremia. Has been admitted for further evaluation and management will stop   Assessment & Plan:   Principal Problem:   Hyponatremia Active Problems:   Hypertension   GERD (gastroesophageal reflux disease)   Atrial fibrillation-Permanent   Chronic anticoagulation   Congestive heart failure, NYHA class III (HCC)   UTI (urinary tract infection) due to Enterococcus   Elevated PSA   Bladder mass   Hyponatremia -Not improved with fluid restriction -He does appear a little hypovolemic to me. -We'll discontinue fluid restriction and start saline at 75 mL an hour. -We'll recheck sodium levels in a.m.  Acute urinary retention -Keep Foley in place until follow-up with urologist.  Bladder mass -Follow-up with urology as an outpatient.  UTI -Continue Rocephin pending culture data   DVT prophylaxis: Coumadin Code Status: DO NOT RESUSCITATE Family Communication: Son and daughter at bedside updated on plan of care and all questions answered Disposition Plan: To be determined  Consultants:   None  Procedures:   None  Antimicrobials:   Rocephin    Subjective: Feels well, no complaints  Objective: Filed Vitals:   11/16/15 2100 11/16/15 2322 11/17/15 0645 11/17/15 1300  BP: 115/69 134/78 95/61 101/49  Pulse: 70 78 70 74  Temp:   98.2 F (36.8 C) 98.7 F (37.1 C)  TempSrc:   Oral Oral  Resp:  17 20 20   Height:      Weight:   85.322 kg (188 lb  1.6 oz)   SpO2: 98% 99% 99% 100%    Intake/Output Summary (Last 24 hours) at 11/17/15 1602 Last data filed at 11/17/15 1200  Gross per 24 hour  Intake    480 ml  Output    700 ml  Net   -220 ml   Filed Weights   11/16/15 1735 11/17/15 0645  Weight: 85.276 kg (188 lb) 85.322 kg (188 lb 1.6 oz)    Examination:  General exam: Alert, awake, oriented x 3 Respiratory system: Clear to auscultation. Respiratory effort normal. Cardiovascular system:RRR. No murmurs, rubs, gallops. Gastrointestinal system: Abdomen is nondistended, soft and nontender. No organomegaly or masses felt. Normal bowel sounds heard. Central nervous system: Alert and oriented. No focal neurological deficits. Extremities: No C/C/E, +pedal pulses Skin: No rashes, lesions or ulcers Psychiatry: Judgement and insight appear normal. Mood & affect appropriate.     Data Reviewed: I have personally reviewed following labs and imaging studies  CBC:  Recent Labs Lab 11/16/15 2209 11/17/15 0215  WBC 10.3 10.1  NEUTROABS 7.8*  --   HGB 12.2* 11.9*  HCT 36.2* 34.8*  MCV 89.2 89.0  PLT 125* [id number]*   Basic Metabolic Panel:  Recent Labs Lab 11/12/15 0835 11/16/15 2209 11/17/15 0215 11/17/15 1001  NA 133* 118* 123* 122*  K 4.5 4.3 4.5 4.4  CL 91* 88* 89* 93*  CO2 27 26 27 25   GLUCOSE 88 110* 94 178*  BUN 10 17 16 15   CREATININE 0.91 0.93 0.91 0.75  CALCIUM  8.9 7.9* 8.1* 8.0*   GFR: Estimated Creatinine Clearance: 85 mL/min (by C-G formula based on Cr of 0.75). Liver Function Tests:  Recent Labs Lab 11/16/15 2209  AST 21  ALT 17  ALKPHOS 92  BILITOT 1.7*  PROT 6.4*  ALBUMIN 3.6   No results for input(s): LIPASE, AMYLASE in the last 168 hours. No results for input(s): AMMONIA in the last 168 hours. Coagulation Profile:  Recent Labs Lab 11/17/15 0215  INR 2.24*   Cardiac Enzymes: No results for input(s): CKTOTAL, CKMB, CKMBINDEX, TROPONINI in the last 168 hours. BNP (last 3 results) No  results for input(s): PROBNP in the last 8760 hours. HbA1C: No results for input(s): HGBA1C in the last 72 hours. CBG: No results for input(s): GLUCAP in the last 168 hours. Lipid Profile: No results for input(s): CHOL, HDL, LDLCALC, TRIG, CHOLHDL, LDLDIRECT in the last 72 hours. Thyroid Function Tests: No results for input(s): TSH, T4TOTAL, FREET4, T3FREE, THYROIDAB in the last 72 hours. Anemia Panel: No results for input(s): VITAMINB12, FOLATE, FERRITIN, TIBC, IRON, RETICCTPCT in the last 72 hours. Urine analysis:    Component Value Date/Time   COLORURINE YELLOW 11/16/2015 1815   APPEARANCEUR CLEAR 11/16/2015 1815   APPEARANCEUR Cloudy* 11/13/2015 1146   LABSPEC 1.020 11/16/2015 1815   PHURINE 6.0 11/16/2015 1815   GLUCOSEU NEGATIVE 11/16/2015 1815   HGBUR LARGE* 11/16/2015 1815   BILIRUBINUR NEGATIVE 11/16/2015 1815   BILIRUBINUR Negative 11/13/2015 1146   BILIRUBINUR neg 04/01/2014 [place]* 11/16/2015 1815   PROTEINUR 100* 11/16/2015 1815   PROTEINUR 2+* 11/13/2015 1146   PROTEINUR neg 04/01/2014 1244   UROBILINOGEN negative 04/01/2014 1244   NITRITE NEGATIVE 11/16/2015 1815   NITRITE Negative 11/13/2015 1146   NITRITE neg 04/01/2014 1244   LEUKOCYTESUR TRACE* 11/16/2015 1815   LEUKOCYTESUR 3+* 11/13/2015 1146   Sepsis Labs: @LABRCNTIP (procalcitonin:4,lacticidven:4)  ) Recent Results (from the past 240 hour(s))  Microscopic Examination     Status: Abnormal   Collection Time: 11/13/15 11:46 AM  Result Value Ref Range Status   WBC, UA >30 (A) 0 -  5 /hpf Final   RBC, UA 3-10 (A) 0 -  2 /hpf Final   Epithelial Cells (non renal) None seen 0 - 10 /hpf Final   Bacteria, UA Few None seen/Few Final  Urine culture     Status: Abnormal   Collection Time: 11/13/15  3:16 PM  Result Value Ref Range Status   Urine Culture, Routine Final report (A)  Final   Urine Culture result 1 Enterococcus faecalis (A)  Final    Comment: Greater than 100,000 colony forming  units per mL Note: this isolate is vancomycin-susceptible. This information is provided for epidemiologic purposes only: vancomycin is not among the antibiotics recommended for therapy of urinary tract infections caused by Enterococcus.    RESULT 2 Comment (A)  Final    Comment: Beta hemolytic Streptococcus, group B Greater than 100,000 colony forming units per mL Penicillin and ampicillin are drugs of choice for treatment of beta-hemolytic streptococcal infections. Susceptibility testing of penicillins and other beta-lactam agents approved by the FDA for treatment of beta-hemolytic streptococcal infections need not be performed routinely because nonsusceptible isolates are extremely rare in any beta-hemolytic streptococcus and have not been reported for Streptococcus pyogenes (group A). (CLSI 2011)    ANTIMICROBIAL SUSCEPTIBILITY Comment  Final    Comment:       ** S = Susceptible; I = Intermediate; R = Resistant **  P = Positive; N = Negative             MICS are expressed in micrograms per mL    Antibiotic                 RSLT#1    RSLT#2    RSLT#3    RSLT#4 Ciprofloxacin                  R Levofloxacin                   R Nitrofurantoin                 S Penicillin                     S Tetracycline                   R Vancomycin                     S          Radiology Studies: Ct Abdomen Pelvis W Contrast  11/16/2015  CLINICAL DATA:  Difficulty urinating.  Left flank pain. EXAM: CT ABDOMEN AND PELVIS WITH CONTRAST TECHNIQUE: Multidetector CT imaging of the abdomen and pelvis was performed using the standard protocol following bolus administration of intravenous contrast. CONTRAST:  159mL ISOVUE-300 IOPAMIDOL (ISOVUE-300) INJECTION 61% COMPARISON:  04/04/2014 FINDINGS: Scan was performed in the late arterial phase; advantageous for evaluating bladder mass. Lower chest and abdominal wall: Probable prior right inguinal hernia repair. Biventricular ICD/ pacer.  Left ventricular thinning and subendocardial low density from previous infarction. Hepatobiliary: No focal liver abnormality.Cholecystectomy. Negative common bile duct. Pancreas: Unremarkable. Spleen: Unremarkable. Adrenals/Urinary Tract: Negative adrenals. No hydronephrosis or stone. Bilateral renal cortical cysts, measuring up to 69 mm from the interpolar left kidney. This cyst has a thin benign-appearing septation. High-density mass from the left aspect of the bladder fundus which appears centered in the wall, but also exophytic into the lumen, measuring approximately 4 cm. High-density mainly attributed to avid enhancement, although there could also be superimposed mineralization. No concerning pelvic lymph nodes. The remainder of the bladder wall is thickened, nonspecific. Small volume pneumaturia, with presumed recent sampling. Stomach/Bowel: Moderate volume of stool without obstruction or rectal impaction. Clip seen in the sigmoid colon. No appendicitis. Reproductive:There is asymmetric heterogeneous enhancement fullness of the right prostate gland. Vascular/Lymphatic: Extensive atherosclerosis of the aorta and branch vessels. Replaced right hepatic artery. No acute vascular finding. There is retroperitoneal edema that is likely from volume overload. Other: No ascites or pneumoperitoneum. Musculoskeletal: No acute abnormalities. No sclerotic or lytic bone lesion. IMPRESSION: 1. 4 cm bladder mass consistent with urothelial carcinoma. The mass has a transmural appearance. No evidence of metastatic disease. 2. Asymmetric enhancement and fullness of the right prostate concerning for carcinoma. Please correlate with PSA. 3. History of urinary retention. Currently there is physiologic distension of the bladder. Nonspecific bladder wall thickening, correlate with urinalysis. 4. Retroperitoneal edema, likely volume overload. Electronically Signed   By: Monte Fantasia M.D.   On: 11/16/2015 23:05         Scheduled Meds: . carvedilol  6.25 mg Oral BID  . cefTRIAXone (ROCEPHIN)  IV  1 g Intravenous Q24H  . docusate sodium  100 mg Oral BID  . doxepin  25 mg Oral q morning - 10a   And  . doxepin  50 mg Oral QHS  . gabapentin  100 mg  Oral QID  . gi cocktail  30 mL Oral Once  . isosorbide mononitrate  60 mg Oral Daily  . [START ON 11/18/2015] lisinopril  5 mg Oral QODAY  . multivitamin-lutein  2 capsule Oral Daily  . pantoprazole  40 mg Oral Daily  . polyethylene glycol  17 g Oral Daily  . rosuvastatin  10 mg Oral Daily  . senna  1 tablet Oral BID  . sodium chloride flush  3 mL Intravenous Q12H  . sodium chloride flush  3 mL Intravenous Q12H  . warfarin  5 mg Oral QPM  . Warfarin - Pharmacist Dosing Inpatient   Does not apply q1800   Continuous Infusions: . sodium chloride 75 mL/hr at 11/17/15 1559     LOS: 0 days    Time spent: 25 minutes. Greater than 50% of this time was spent in direct contact with the patient coordinating care.     Lelon Frohlich, MD [facility] Pager [phone]  If 7PM-7AM, please contact night-coverage [email or web address] Password [email or web address] 11/17/2015, 4:02 PM

## 2015-11-18 ENCOUNTER — Telehealth: Payer: Self-pay | Admitting: *Deleted

## 2015-11-18 DIAGNOSIS — Z7901 Long term (current) use of anticoagulants: Secondary | ICD-10-CM

## 2015-11-18 DIAGNOSIS — I5022 Chronic systolic (congestive) heart failure: Secondary | ICD-10-CM

## 2015-11-18 DIAGNOSIS — D696 Thrombocytopenia, unspecified: Secondary | ICD-10-CM | POA: Diagnosis present

## 2015-11-18 DIAGNOSIS — C689 Malignant neoplasm of urinary organ, unspecified: Secondary | ICD-10-CM

## 2015-11-18 DIAGNOSIS — R972 Elevated prostate specific antigen [PSA]: Secondary | ICD-10-CM

## 2015-11-18 DIAGNOSIS — I482 Chronic atrial fibrillation: Secondary | ICD-10-CM

## 2015-11-18 DIAGNOSIS — B952 Enterococcus as the cause of diseases classified elsewhere: Secondary | ICD-10-CM

## 2015-11-18 DIAGNOSIS — R339 Retention of urine, unspecified: Secondary | ICD-10-CM

## 2015-11-18 DIAGNOSIS — N390 Urinary tract infection, site not specified: Secondary | ICD-10-CM

## 2015-11-18 LAB — CBC
HEMATOCRIT: 33.3 % — AB (ref 39.0–52.0)
HEMOGLOBIN: 11.4 g/dL — AB (ref 13.0–17.0)
MCH: 30.2 pg (ref 26.0–34.0)
MCHC: 34.2 g/dL (ref 30.0–36.0)
MCV: 88.3 fL (ref 78.0–100.0)
Platelets: 110 10*3/uL — ABNORMAL LOW (ref 150–400)
RBC: 3.77 MIL/uL — AB (ref 4.22–5.81)
RDW: 14 % (ref 11.5–15.5)
WBC: 7.4 10*3/uL (ref 4.0–10.5)

## 2015-11-18 LAB — PROTIME-INR
INR: 1.91 — AB (ref 0.00–1.49)
PROTHROMBIN TIME: 21.8 s — AB (ref 11.6–15.2)

## 2015-11-18 LAB — TSH: TSH: 1.702 u[IU]/mL (ref 0.350–4.500)

## 2015-11-18 LAB — URIC ACID: URIC ACID, SERUM: 3.3 mg/dL — AB (ref 4.4–7.6)

## 2015-11-18 LAB — BASIC METABOLIC PANEL
Anion gap: 2 — ABNORMAL LOW (ref 5–15)
BUN: 21 mg/dL — ABNORMAL HIGH (ref 6–20)
CHLORIDE: 94 mmol/L — AB (ref 101–111)
CO2: 25 mmol/L (ref 22–32)
CREATININE: 0.88 mg/dL (ref 0.61–1.24)
Calcium: 7.5 mg/dL — ABNORMAL LOW (ref 8.9–10.3)
GFR calc non Af Amer: >60 mL/min (ref >60)
Glucose, Bld: 92 mg/dL (ref 65–99)
POTASSIUM: 4.3 mmol/L (ref 3.5–5.1)
SODIUM: 121 mmol/L — AB (ref 135–145)

## 2015-11-18 LAB — URINE CULTURE: Culture: NO GROWTH

## 2015-11-18 LAB — VITAMIN B12: VITAMIN B 12: 590 pg/mL (ref 180–914)

## 2015-11-18 MED ORDER — WARFARIN SODIUM 5 MG PO TABS: 5.0000 mg | ORAL_TABLET | Freq: Once | ORAL | Status: DC

## 2015-11-18 MED ORDER — FUROSEMIDE 10 MG/ML IJ SOLN
20.0000 mg | Freq: Every day | INTRAMUSCULAR | Status: DC
  Administered 2015-11-18 – 2015-11-19 (×2): 20 mg via INTRAVENOUS
  Filled 2015-11-18 (×2): qty 2

## 2015-11-18 MED ORDER — SODIUM CHLORIDE 0.9 % IV SOLN
1.0000 g | Freq: Four times a day (QID) | INTRAVENOUS | Status: DC
  Administered 2015-11-18 – 2015-11-20 (×9): 1 g via INTRAVENOUS
  Filled 2015-11-18 (×22): qty 1000

## 2015-11-18 NOTE — Progress Notes (Signed)
Pharmacy Antibiotic Note  [name] is a 75 y.o. male admitted on 11/16/2015 with UTI.  Pharmacy has been consulted for Ampicillin dosing.  Plan: Ampicillin 1gm IV q6h Deescalate to PO antibiotic as indicated    Height: 5\' 11"  (180.3 cm) Weight: 188 lb 1.6 oz (85.322 kg) IBW/kg (Calculated) : 75.3  Temp (24hrs), Avg:98.4 F (36.9 C), Min:97.7 F (36.5 C), Max:98.7 F (37.1 C)   Recent Labs Lab 11/16/15 2209 11/17/15 0215 11/17/15 1001 11/17/15 1755 11/18/15 0230  WBC 10.3 10.1  --   --  7.4  CREATININE 0.93 0.91 0.75 0.94 0.88    Estimated Creatinine Clearance: 77.2 mL/min (by C-G formula based on Cr of 0.88).    Allergies  Allergen Reactions  . Hydrocodone-Acetaminophen Itching  . Prednisone Itching  . Sulfa Drugs Cross Reactors Other (See Comments)    unknown  . Ultram [Tramadol Hcl] Itching    "don't remember how bad"  . Xarelto [Rivaroxaban] Other (See Comments)    Itching   . Zithromax [Azithromycin] Itching    Itching     Antimicrobials this admission: Ceftriaxone 7/18 >>7/19 Ampicillin 7/19 >>   Dose adjustments this admission:   Microbiology results: 7/14 UCx: E.Faecalis 100,000 CFU/ml S-PCN, Vanc  Also Grp B Strep present  7/17 Urine cx: no growth  Thank you for allowing pharmacy to be a part of this patient's care.  Isac Sarna, BS [place], [place] Clinical Pharmacist Pager [phone] 11/18/2015 12:20 PM

## 2015-11-18 NOTE — Telephone Encounter (Signed)
Patient is in [facility] and requesting lab results from blood work done by the doctors there.  He was advised to get those results from his providers at hospital.

## 2015-11-18 NOTE — Progress Notes (Signed)
ANTICOAGULATION CONSULT NOTE -   Pharmacy Consult for Warfarin (chronic Rx PTA).  Pt states only [name] works for him  Indication: atrial fibrillation  Allergies  Allergen Reactions  . Hydrocodone-Acetaminophen Itching  . Prednisone Itching  . Sulfa Drugs Cross Reactors Other (See Comments)    unknown  . Ultram [Tramadol Hcl] Itching    "don't remember how bad"  . Xarelto [Rivaroxaban] Other (See Comments)    Itching   . Zithromax [Azithromycin] Itching    Itching     Patient Measurements: Height: 5\' 11"  (180.3 cm) Weight: 188 lb 1.6 oz (85.322 kg) IBW/kg (Calculated) : 75.3  Vital Signs: Temp: 97.7 F (36.5 C) (07/19 0531) Temp Source: Oral (07/19 0531) BP: 112/72 mmHg (07/19 0531) Pulse Rate: 73 (07/19 0531)  Labs:  Recent Labs  11/16/15 2209 11/17/15 0215 11/17/15 1001 11/17/15 1755 11/18/15 0230  HGB 12.2* 11.9*  --   --  11.4*  HCT 36.2* 34.8*  --   --  33.3*  PLT 125* 112*  --   --  110*  LABPROT  --  24.6*  --   --  21.8*  INR  --  2.24*  --   --  1.91*  CREATININE 0.93 0.91 0.75 0.94 0.88   Estimated Creatinine Clearance: 77.2 mL/min (by C-G formula based on Cr of 0.88).  Medical History: Past Medical History  Diagnosis Date  . Ischemic cardiomyopathy     a. 06/2012 EF 25% by echo.  . Hypertension   . Hyperlipidemia   . GERD (gastroesophageal reflux disease)   . Atrial flutter ([place])     a. 01/2011 s/p unsuccessful RFCA complicated by CHB req PPM.  . CHB (complete heart block) ([place])     a. 01/2011: in setting of RFCA, s/p SJM 2110 [employer] DC PPM, ser # [id number].  [name] Kitchen Kidney cysts     a. bilateral  . Hiatal hernia     a. 03/01/2013 EGD and esoph dil.  [name] Kitchen BPH (benign prostatic hyperplasia)     a. elevated PSA  . A-fib (HCC)     a. chronic coumadin  . Elevated PSA, less than 10 ng/ml   . Fibromyalgia   . Skin cancer     'above left eyebrow; tip of my nose" (08/29/2012)  . Depression     "since 1986-1987" (08/29/2012)  . Anxiety   . Panic  attacks   . Chronic systolic CHF (congestive heart failure), NYHA class 2 ([place])     a. 06/2012 Echo: EF 25%, distal sept and apical AK, sev dil LV, mild LVH, mod dil LA.  [name] Kitchen Pacemaker     explanted 06/15/15 >>> CRT-D STJ device, Dr. Caryl Comes  . CAD (coronary artery disease)     a. 1991 s/p CABGx4;  b. redo CABG in 2007 (SVG->OM, VG->PDA->PLV);  c. 01/2011 Cath: LM 80ost, LAD 100p, LCX 100ost, OM1 100, RCA 100, LIMA->LAD ok, VG->PDA ok, VG->OM ok;  d. 03/2012 MV: EF 32% inf & inflat scar w/o ischemia; e. Lexi MV: EF 33%, inf infarct, no ischemia. f. lexiscan 06/12/2014 RCA scar, no ischemia, EF 26%   Medications:  Prescriptions prior to admission  Medication Sig Dispense Refill Last Dose  . acetaminophen (TYLENOL) 500 MG tablet Take 500 mg by mouth every 8 (eight) hours as needed for mild pain.    11/16/2015 at 1630  . ALPRAZolam (XANAX) 1 MG tablet Take 1 tablet (1 mg total) by mouth 3 (three) times daily as needed. For anxiety 270 tablet 1 11/16/2015 at  Unknown time  . carvedilol (COREG) 6.25 MG tablet Take 1 tablet (6.25 mg total) by mouth 2 (two) times daily. 180 tablet 2 11/16/2015 at 1700  . cephALEXin (KEFLEX) 500 MG capsule Take 1 capsule (500 mg total) by mouth 4 (four) times daily. 28 capsule 0 11/16/2015 at Unknown time  . doxepin (SINEQUAN) 25 MG capsule Take 25 mg (1 capsule) by mouth in the morning and 50 mg (2 capsules) by mouth at night. (Patient taking differently: Take 25-50 mg by mouth 2 (two) times daily. Take 25 mg (1 capsule) by mouth in the morning and 50 mg (2 capsules) by mouth at night.) 270 capsule 3 11/16/2015 at Unknown time  . fluticasone (FLONASE) 50 MCG/ACT nasal spray Place 2 sprays into both nostrils daily. 16 g 6 11/16/2015 at Unknown time  . furosemide (LASIX) 20 MG tablet Take 1 tablet (20 mg total) by mouth daily as needed for fluid. One tablet by mouth daily as needed for swelling 90 tablet 1 11/13/2015 at Unknown time  . gabapentin (NEURONTIN) 100 MG capsule Take 100 mg by  mouth 4 (four) times daily.   11/16/2015 at Unknown time  . guaiFENesin (MUCINEX) 600 MG 12 hr tablet Take 600 mg by mouth 2 (two) times daily. Reported on 11/12/2015   11/16/2015 at Unknown time  . hydroxypropyl methylcellulose / hypromellose (ISOPTO TEARS / GONIOVISC) 2.5 % ophthalmic solution Place 1 drop into both eyes 3 (three) times daily as needed for dry eyes.   Past Week at --------------------------------------------------------------------------------------------------------------------------------  . isosorbide mononitrate (IMDUR) 60 MG 24 hr tablet Take 1 tablet (60 mg total) by mouth daily. 90 tablet 2 11/16/2015 at Unknown time  . lisinopril (PRINIVIL,ZESTRIL) 5 MG tablet Take 1 tablet (5 mg total) by mouth every other day. 90 tablet 3 11/16/2015 at Unknown time  . loratadine (CLARITIN) 10 MG tablet Take 10 mg by mouth daily.   11/16/2015 at Unknown time  . Multiple Vitamins-Minerals (PRESERVISION AREDS 2) CAPS Take 2 capsules by mouth daily. Reported on 06/26/2015   11/16/2015 at Unknown time  . nitroGLYCERIN (NITROSTAT) 0.4 MG SL tablet DISSOLVE ONE TABLET UNDER THE TONGUE EVERY 5 MINUTES AS NEEDED FOR CHEST PAIN.  DO NOT EXCEED A TOTAL OF 3 DOSES IN 15 MINUTES 100 tablet 3 unknown  . omeprazole (PRILOSEC) 20 MG capsule Take 1 capsule (20 mg total) by mouth 2 (two) times daily. 180 capsule 3 11/16/2015 at Unknown time  . polyethylene glycol (MIRALAX / GLYCOLAX) packet Take 17 g by mouth daily.    11/16/2015 at Unknown time  . rosuvastatin (CRESTOR) 10 MG tablet Take 1 tablet (10 mg total) by mouth daily. 90 tablet 3 11/16/2015 at Unknown time  . warfarin (JANTOVEN) 5 MG tablet Take 5 mg by mouth every evening.   11/15/2015 at 2100    Assessment: 75yo male on chronic Warfarin for h/o afib.  Pt states that only [place] works for him.  Pt will use own supply from home as Jantoven not stocked at [facility].  INR is therapeutic on admission but a little lower today at 1.91. Continue home dose.  Goal of  Therapy:  INR 2-3 Monitor platelets by anticoagulation protocol: Yes   Plan:  Warfarin 5mg  po today x 1 (pt's home dose) Daily PT/INR Monitor for s/s of bleeding and CBC  Isac Sarna, BS [place], BCPS Clinical Pharmacist Pager [phone] 11/18/2015,10:46 AM

## 2015-11-18 NOTE — Progress Notes (Signed)
PROGRESS NOTE    [name]  [id number] DOB: Sep 20, 1940 DOA: 11/16/2015 PCP: Eustaquio Maize, MD  And Urologist Tresa Endo in [address].   Brief Narrative:  Patient is a 75 year old man with a history of chronic systolic congestive heart failure with an EF of 30-35%, CAD, atrial fibrillation on Coumadin, status post pacemaker, and recent hernia surgery 5 weeks ago. He was also started on Keflex by his PCP for an UTI. He developed urinary retention following the surgery and had an outpatient indwelling Foley catheter. The Foley catheter was discontinued last week. The patient presented to the ED on 11/16/15 with the inability to void. CT of the abdomen and pelvis revealed a 4 cm bladder mass consistent with urothelial carcinoma and asymmetric enhancement and fullness of the right prostate concerning for carcinoma. He was admitted for further evaluation and management.   Assessment & Plan:   Principal Problem:   Urinary retention Active Problems:   Hyponatremia   Bladder mass   Urothelial carcinoma (HCC)   UTI (urinary tract infection) due to Enterococcus   Hypertension   GERD (gastroesophageal reflux disease)   Atrial fibrillation-Permanent   Chronic anticoagulation   Chronic systolic congestive heart failure (HCC)   Elevated PSA   Thrombocytopenia (HCC)   1. Urinary retention. Patient is followed by urologist Dr. Tresa Endo for recent history of urinary retention. The Foley catheter was discontinued last week, but the patient subsequently developed recurrent urinary retention. Indwelling Foley catheter was reinserted in the ED and it is draining well. -The etiology is likely to the bladder mass seen on the CT, query prostate enlargement or malignancy. -The plan is to discuss the findings further with Dr. Rosana Hoes via phone call.  4 cm bladder mass and right-sided prostate fullness. Per the radiologist, the findings are consistent with urothelial carcinoma and  suspicious for right prostate carcinoma. PSA was ordered and was 15.27. These findings are new to the patient. -Will discuss the findings further with Dr. Rosana Hoes via phone call.  Enterococcus urinary tract infection. The patient had been started on Keflex for a UTI. Urine culture from 11/13/15 was positive for enterococcus, not covered by cephalosporins. Rocephin was started, but it will be discontinued pending the urine culture. -We'll start vancomycin. Await urine culture results ordered on admission.  Hyponatremia. The patient's serum sodium was 118 on admission. Etiology is uncertain, but it appears that he is not volume overloaded and is euvolemic. Given his recent history of increase water intake, is possible that he has hyponatremia from water intoxication. -Patient was started on fluid restriction, but it was discontinued in favor of normal saline infusion. -His serum sodium has improved a little. -We will discontinue normal saline infusion and start IV Lasix for possible SIADH. Will order a uric acid level for assessment.  Hypertension and CAD. Patient is treated chronically with lisinopril, isosorbide mononitrate, and carvedilol. Currently stable. Continue medications.  Chronic systolic congestive heart failure. Echo December 2016 revealed an EF of 30-35%. He is followed by cardiology. His CHF appears to be compensated; he does not appear to be volume overloaded. -Lasix was on hold, but will be restarted in light of the hyponatremia.  Chronic atrial fibrillation and permanent pacemaker due to history of heart block. He is treated chronically with warfarin and carvedilol for rate control. His INR was therapeutic on admission. We'll continue Coumadin per pharmacy.  Thrombocytopenia. Patient's platelet count was 125 on admission. It was 145 one month ago and within normal limits 4 months ago.  Etiology is unknown, but will order TSH and vitamin B12 level for further evaluation.    DVT  prophylaxis: Warfarin Code Status: Full code Family Communication: Family not available Disposition Plan: Discharged home in clinically appropriate   Consultants:   None  Procedures:   None  Antimicrobials:   Vancomycin 11/18/15  Rocephin 11/16/15>> 11/18/15    Subjective: Patient has some "mild" suprapubic tenderness. Otherwise he denies chest pain or shortness of breath, nausea vomiting.  Objective: Filed Vitals:   11/17/15 0645 11/17/15 1300 11/17/15 2112 11/18/15 0531  BP: 95/61 101/49 117/63 112/72  Pulse: 70 74 77 73  Temp: 98.2 F (36.8 C) 98.7 F (37.1 C) 98.6 F (37 C) 97.7 F (36.5 C)  TempSrc: Oral Oral Oral Oral  Resp: 20 20  17   Height:      Weight: 85.322 kg (188 lb 1.6 oz)     SpO2: 99% 100% 98% 99%    Intake/Output Summary (Last 24 hours) at 11/18/15 1041 Last data filed at 11/18/15 0532  Gross per 24 hour  Intake    240 ml  Output   1550 ml  Net  -1310 ml   Filed Weights   11/16/15 1735 11/17/15 0645  Weight: 85.276 kg (188 lb) 85.322 kg (188 lb 1.6 oz)    Examination:  General exam: Appears calm and comfortable; Pleasant 75 year old man in no acute distress.  Respiratory system: Clear to auscultation. Respiratory effort normal. Cardiovascular system: Irregular, irregular, with a soft systolic murmur. No pedal edema. Gastrointestinal system: Abdomen is nondistended, soft and mild suprapubic tenderness. No organomegaly or masses felt. Normal bowel sounds heard. GU: Indwelling Foley catheter draining clear yellow urine. Central nervous system: Alert and oriented. No focal neurological deficits. Extremities: Symmetric 5 x 5 power. Skin: No rashes, lesions or ulcers Psychiatry: Judgement and insight appear normal. Mood & affect appropriate.     Data Reviewed: I have personally reviewed following labs and imaging studies  CBC:  Recent Labs Lab 11/16/15 2209 11/17/15 0215 11/18/15 0230  WBC 10.3 10.1 7.4  NEUTROABS 7.8*  --   --     HGB 12.2* 11.9* 11.4*  HCT 36.2* 34.8* 33.3*  MCV 89.2 89.0 88.3  PLT 125* 112* [id number]*   Basic Metabolic Panel:  Recent Labs Lab 11/16/15 2209 11/17/15 0215 11/17/15 1001 11/17/15 1755 11/18/15 0230  NA 118* 123* 122* 122* 121*  K 4.3 4.5 4.4 4.7 4.3  CL 88* 89* 93* 93* 94*  CO2 26 27 25 27 25   GLUCOSE 110* 94 178* 139* 92  BUN 17 16 15 19  21*  CREATININE 0.93 0.91 0.75 0.94 0.88  CALCIUM 7.9* 8.1* 8.0* 7.8* 7.5*   GFR: Estimated Creatinine Clearance: 77.2 mL/min (by C-G formula based on Cr of 0.88). Liver Function Tests:  Recent Labs Lab 11/16/15 2209  AST 21  ALT 17  ALKPHOS 92  BILITOT 1.7*  PROT 6.4*  ALBUMIN 3.6   No results for input(s): LIPASE, AMYLASE in the last 168 hours. No results for input(s): AMMONIA in the last 168 hours. Coagulation Profile:  Recent Labs Lab 11/17/15 0215 11/18/15 0230  INR 2.24* 1.91*   Cardiac Enzymes: No results for input(s): CKTOTAL, CKMB, CKMBINDEX, TROPONINI in the last 168 hours. BNP (last 3 results) No results for input(s): PROBNP in the last 8760 hours. HbA1C: No results for input(s): HGBA1C in the last 72 hours. CBG: No results for input(s): GLUCAP in the last 168 hours. Lipid Profile: No results for input(s): CHOL, HDL, LDLCALC, TRIG, CHOLHDL,  LDLDIRECT in the last 72 hours. Thyroid Function Tests: No results for input(s): TSH, T4TOTAL, FREET4, T3FREE, THYROIDAB in the last 72 hours. Anemia Panel: No results for input(s): VITAMINB12, FOLATE, FERRITIN, TIBC, IRON, RETICCTPCT in the last 72 hours. Sepsis Labs: No results for input(s): PROCALCITON, LATICACIDVEN in the last 168 hours.  Recent Results (from the past 240 hour(s))  Microscopic Examination     Status: Abnormal   Collection Time: 11/13/15 11:46 AM  Result Value Ref Range Status   WBC, UA >30 (A) 0 -  5 /hpf Final   RBC, UA 3-10 (A) 0 -  2 /hpf Final   Epithelial Cells (non renal) None seen 0 - 10 /hpf Final   Bacteria, UA Few None seen/Few Final   Urine culture     Status: Abnormal   Collection Time: 11/13/15  3:16 PM  Result Value Ref Range Status   Urine Culture, Routine Final report (A)  Final   Urine Culture result 1 Enterococcus faecalis (A)  Final    Comment: Greater than 100,000 colony forming units per mL Note: this isolate is vancomycin-susceptible. This information is provided for epidemiologic purposes only: vancomycin is not among the antibiotics recommended for therapy of urinary tract infections caused by Enterococcus.    RESULT 2 Comment (A)  Final    Comment: Beta hemolytic Streptococcus, group B Greater than 100,000 colony forming units per mL Penicillin and ampicillin are drugs of choice for treatment of beta-hemolytic streptococcal infections. Susceptibility testing of penicillins and other beta-lactam agents approved by the FDA for treatment of beta-hemolytic streptococcal infections need not be performed routinely because nonsusceptible isolates are extremely rare in any beta-hemolytic streptococcus and have not been reported for Streptococcus pyogenes (group A). (CLSI 2011)    ANTIMICROBIAL SUSCEPTIBILITY Comment  Final    Comment:       ** S = Susceptible; I = Intermediate; R = Resistant **                    P = Positive; N = Negative             MICS are expressed in micrograms per mL    Antibiotic                 RSLT#1    RSLT#2    RSLT#3    RSLT#4 Ciprofloxacin                  R Levofloxacin                   R Nitrofurantoin                 S Penicillin                     S Tetracycline                   R Vancomycin                     S          Radiology Studies: Ct Abdomen Pelvis W Contrast  11/16/2015  CLINICAL DATA:  Difficulty urinating.  Left flank pain. EXAM: CT ABDOMEN AND PELVIS WITH CONTRAST TECHNIQUE: Multidetector CT imaging of the abdomen and pelvis was performed using the standard protocol following bolus administration of intravenous contrast. CONTRAST:  192mL  ISOVUE-300 IOPAMIDOL (ISOVUE-300) INJECTION 61% COMPARISON:  04/04/2014 FINDINGS: Scan was performed in the late  arterial phase; advantageous for evaluating bladder mass. Lower chest and abdominal wall: Probable prior right inguinal hernia repair. Biventricular ICD/ pacer. Left ventricular thinning and subendocardial low density from previous infarction. Hepatobiliary: No focal liver abnormality.Cholecystectomy. Negative common bile duct. Pancreas: Unremarkable. Spleen: Unremarkable. Adrenals/Urinary Tract: Negative adrenals. No hydronephrosis or stone. Bilateral renal cortical cysts, measuring up to 69 mm from the interpolar left kidney. This cyst has a thin benign-appearing septation. High-density mass from the left aspect of the bladder fundus which appears centered in the wall, but also exophytic into the lumen, measuring approximately 4 cm. High-density mainly attributed to avid enhancement, although there could also be superimposed mineralization. No concerning pelvic lymph nodes. The remainder of the bladder wall is thickened, nonspecific. Small volume pneumaturia, with presumed recent sampling. Stomach/Bowel: Moderate volume of stool without obstruction or rectal impaction. Clip seen in the sigmoid colon. No appendicitis. Reproductive:There is asymmetric heterogeneous enhancement fullness of the right prostate gland. Vascular/Lymphatic: Extensive atherosclerosis of the aorta and branch vessels. Replaced right hepatic artery. No acute vascular finding. There is retroperitoneal edema that is likely from volume overload. Other: No ascites or pneumoperitoneum. Musculoskeletal: No acute abnormalities. No sclerotic or lytic bone lesion. IMPRESSION: 1. 4 cm bladder mass consistent with urothelial carcinoma. The mass has a transmural appearance. No evidence of metastatic disease. 2. Asymmetric enhancement and fullness of the right prostate concerning for carcinoma. Please correlate with PSA. 3. History of urinary  retention. Currently there is physiologic distension of the bladder. Nonspecific bladder wall thickening, correlate with urinalysis. 4. Retroperitoneal edema, likely volume overload. Electronically Signed   By: Monte Fantasia M.D.   On: 11/16/2015 23:05        Scheduled Meds: . carvedilol  6.25 mg Oral BID  . cefTRIAXone (ROCEPHIN)  IV  1 g Intravenous Q24H  . docusate sodium  100 mg Oral BID  . doxepin  25 mg Oral q morning - 10a   And  . doxepin  50 mg Oral QHS  . gabapentin  100 mg Oral QID  . isosorbide mononitrate  60 mg Oral Daily  . lisinopril  5 mg Oral QODAY  . multivitamin-lutein  2 capsule Oral Daily  . pantoprazole  40 mg Oral Daily  . polyethylene glycol  17 g Oral Daily  . rosuvastatin  10 mg Oral Daily  . senna  1 tablet Oral BID  . sodium chloride flush  3 mL Intravenous Q12H  . sodium chloride flush  3 mL Intravenous Q12H  . warfarin  5 mg Oral QPM  . Warfarin - Pharmacist Dosing Inpatient   Does not apply q1800   Continuous Infusions: . sodium chloride 75 mL/hr at 11/17/15 1559     LOS: 1 day    Time spent: 25 minutes    Rexene Alberts, MD [facility] Pager [phone]  If 7PM-7AM, please contact night-coverage [email or web address] Password [email or web address] 11/18/2015, 10:41 AM

## 2015-11-19 LAB — BASIC METABOLIC PANEL
ANION GAP: 7 (ref 5–15)
BUN: 18 mg/dL (ref 6–20)
CHLORIDE: 97 mmol/L — AB (ref 101–111)
CO2: 27 mmol/L (ref 22–32)
Calcium: 8.1 mg/dL — ABNORMAL LOW (ref 8.9–10.3)
Creatinine, Ser: 0.8 mg/dL (ref 0.61–1.24)
GFR calc non Af Amer: >60 mL/min (ref >60)
Glucose, Bld: 87 mg/dL (ref 65–99)
POTASSIUM: 4.3 mmol/L (ref 3.5–5.1)
SODIUM: 131 mmol/L — AB (ref 135–145)

## 2015-11-19 LAB — CBC
HCT: 35.5 % — ABNORMAL LOW (ref 39.0–52.0)
HEMOGLOBIN: 11.6 g/dL — AB (ref 13.0–17.0)
MCH: 29.6 pg (ref 26.0–34.0)
MCHC: 32.7 g/dL (ref 30.0–36.0)
MCV: 90.6 fL (ref 78.0–100.0)
Platelets: 155 10*3/uL (ref 150–400)
RBC: 3.92 MIL/uL — AB (ref 4.22–5.81)
RDW: 14.3 % (ref 11.5–15.5)
WBC: 6.5 10*3/uL (ref 4.0–10.5)

## 2015-11-19 LAB — PROTIME-INR
INR: 1.79 — ABNORMAL HIGH (ref 0.00–1.49)
Prothrombin Time: 20.8 seconds — ABNORMAL HIGH (ref 11.6–15.2)

## 2015-11-19 MED ORDER — SENNOSIDES-DOCUSATE SODIUM 8.6-50 MG PO TABS: 1.0000 | ORAL_TABLET | Freq: Every day | ORAL | Status: DC

## 2015-11-19 MED ORDER — BISACODYL 10 MG RE SUPP: 10.0000 mg | Freq: Every day | RECTAL | Status: DC | PRN

## 2015-11-19 MED ORDER — MAGNESIUM HYDROXIDE 400 MG/5ML PO SUSP
30.0000 mL | Freq: Once | ORAL | Status: AC
  Administered 2015-11-19: 30 mL via ORAL
  Filled 2015-11-19: qty 30

## 2015-11-19 NOTE — Progress Notes (Signed)
Patient ambulated halls this evening with stand by assist.  Tolerated well without difficulty.

## 2015-11-19 NOTE — Progress Notes (Signed)
ANTICOAGULATION CONSULT NOTE -   Pharmacy Consult for Warfarin (chronic Rx PTA).  Pt states only Ervin Knack works for him  Indication: atrial fibrillation  Allergies  Allergen Reactions  . Hydrocodone-Acetaminophen Itching  . Prednisone Itching  . Sulfa Drugs Cross Reactors Other (See Comments)    unknown  . Ultram [Tramadol Hcl] Itching    "don't remember how bad"  . Xarelto [Rivaroxaban] Other (See Comments)    Itching   . Zithromax [Azithromycin] Itching    Itching     Patient Measurements: Height: 5\' 11"  (180.3 cm) Weight: 188 lb 1.6 oz (85.322 kg) IBW/kg (Calculated) : 75.3  Vital Signs: Temp: 98.2 F (36.8 C) (07/20 0527) Temp Source: Oral (07/20 0527) BP: 100/61 mmHg (07/20 0527) Pulse Rate: 77 (07/20 0527)  Labs:  Recent Labs  11/17/15 0215  11/17/15 1755 11/18/15 0230 11/19/15 0416  HGB 11.9*  --   --  11.4* 11.6*  HCT 34.8*  --   --  33.3* 35.5*  PLT 112*  --   --  110* 155  LABPROT 24.6*  --   --  21.8* 20.8*  INR 2.24*  --   --  1.91* 1.79*  CREATININE 0.91  < > 0.94 0.88 0.80  < > = values in this interval not displayed. Estimated Creatinine Clearance: 85 mL/min (by C-G formula based on Cr of 0.8).  Medical History: Past Medical History  Diagnosis Date  . Ischemic cardiomyopathy     a. 06/2012 EF 25% by echo.  . Hypertension   . Hyperlipidemia   . GERD (gastroesophageal reflux disease)   . Atrial flutter ([place])     a. 01/2011 s/p unsuccessful RFCA complicated by CHB req PPM.  . CHB (complete heart block) ([place])     a. 01/2011: in setting of RFCA, s/p SJM 2110 [facility] DC PPM, ser # [id number].  [name] Kitchen Kidney cysts     a. bilateral  . Hiatal hernia     a. 03/01/2013 EGD and esoph dil.  [name] Kitchen BPH (benign prostatic hyperplasia)     a. elevated PSA  . A-fib (HCC)     a. chronic coumadin  . Elevated PSA, less than 10 ng/ml   . Fibromyalgia   . Skin cancer     'above left eyebrow; tip of my nose" (08/29/2012)  . Depression     "since 1986-1987" (08/29/2012)   . Anxiety   . Panic attacks   . Chronic systolic CHF (congestive heart failure), NYHA class 2 ([place])     a. 06/2012 Echo: EF 25%, distal sept and apical AK, sev dil LV, mild LVH, mod dil LA.  [name] Kitchen Pacemaker     explanted 06/15/15 >>> CRT-D STJ device, Dr. Caryl Comes  . CAD (coronary artery disease)     a. 1991 s/p CABGx4;  b. redo CABG in 2007 (SVG->OM, VG->PDA->PLV);  c. 01/2011 Cath: LM 80ost, LAD 100p, LCX 100ost, OM1 100, RCA 100, LIMA->LAD ok, VG->PDA ok, VG->OM ok;  d. 03/2012 MV: EF 32% inf & inflat scar w/o ischemia; e. Lexi MV: EF 33%, inf infarct, no ischemia. f. lexiscan 06/12/2014 RCA scar, no ischemia, EF 26%   Medications:  Prescriptions prior to admission  Medication Sig Dispense Refill Last Dose  . acetaminophen (TYLENOL) 500 MG tablet Take 500 mg by mouth every 8 (eight) hours as needed for mild pain.    11/16/2015 at 1630  . ALPRAZolam (XANAX) 1 MG tablet Take 1 tablet (1 mg total) by mouth 3 (three) times daily as needed. For anxiety  270 tablet 1 11/16/2015 at Unknown time  . carvedilol (COREG) 6.25 MG tablet Take 1 tablet (6.25 mg total) by mouth 2 (two) times daily. 180 tablet 2 11/16/2015 at 1700  . cephALEXin (KEFLEX) 500 MG capsule Take 1 capsule (500 mg total) by mouth 4 (four) times daily. 28 capsule 0 11/16/2015 at Unknown time  . doxepin (SINEQUAN) 25 MG capsule Take 25 mg (1 capsule) by mouth in the morning and 50 mg (2 capsules) by mouth at night. (Patient taking differently: Take 25-50 mg by mouth 2 (two) times daily. Take 25 mg (1 capsule) by mouth in the morning and 50 mg (2 capsules) by mouth at night.) 270 capsule 3 11/16/2015 at Unknown time  . fluticasone (FLONASE) 50 MCG/ACT nasal spray Place 2 sprays into both nostrils daily. 16 g 6 11/16/2015 at Unknown time  . furosemide (LASIX) 20 MG tablet Take 1 tablet (20 mg total) by mouth daily as needed for fluid. One tablet by mouth daily as needed for swelling 90 tablet 1 11/13/2015 at Unknown time  . gabapentin (NEURONTIN) 100 MG  capsule Take 100 mg by mouth 4 (four) times daily.   11/16/2015 at Unknown time  . guaiFENesin (MUCINEX) 600 MG 12 hr tablet Take 600 mg by mouth 2 (two) times daily. Reported on 11/12/2015   11/16/2015 at Unknown time  . hydroxypropyl methylcellulose / hypromellose (ISOPTO TEARS / GONIOVISC) 2.5 % ophthalmic solution Place 1 drop into both eyes 3 (three) times daily as needed for dry eyes.   Past Week at --------------------------------------------------------------------------------------------------------------------------------  . isosorbide mononitrate (IMDUR) 60 MG 24 hr tablet Take 1 tablet (60 mg total) by mouth daily. 90 tablet 2 11/16/2015 at Unknown time  . lisinopril (PRINIVIL,ZESTRIL) 5 MG tablet Take 1 tablet (5 mg total) by mouth every other day. 90 tablet 3 11/16/2015 at Unknown time  . loratadine (CLARITIN) 10 MG tablet Take 10 mg by mouth daily.   11/16/2015 at Unknown time  . Multiple Vitamins-Minerals (PRESERVISION AREDS 2) CAPS Take 2 capsules by mouth daily. Reported on 06/26/2015   11/16/2015 at Unknown time  . nitroGLYCERIN (NITROSTAT) 0.4 MG SL tablet DISSOLVE ONE TABLET UNDER THE TONGUE EVERY 5 MINUTES AS NEEDED FOR CHEST PAIN.  DO NOT EXCEED A TOTAL OF 3 DOSES IN 15 MINUTES 100 tablet 3 unknown  . omeprazole (PRILOSEC) 20 MG capsule Take 1 capsule (20 mg total) by mouth 2 (two) times daily. 180 capsule 3 11/16/2015 at Unknown time  . polyethylene glycol (MIRALAX / GLYCOLAX) packet Take 17 g by mouth daily.    11/16/2015 at Unknown time  . rosuvastatin (CRESTOR) 10 MG tablet Take 1 tablet (10 mg total) by mouth daily. 90 tablet 3 11/16/2015 at Unknown time  . warfarin (JANTOVEN) 5 MG tablet Take 5 mg by mouth every evening.   11/15/2015 at 2100    Assessment: 75yo male on chronic Warfarin for h/o afib.  Pt states that only [place] works for him.  Pt will use own supply from home as Jantoven not stocked at [facility].  INR is therapeutic on admission but a little lower today at 1.79. Continue  home dose. May require slightly higher dose if continues to trend down.  Goal of Therapy:  INR 2-3 Monitor platelets by anticoagulation protocol: Yes   Plan:  Warfarin 5mg  po today x 1 (pt's home dose) Daily PT/INR Monitor for s/s of bleeding and CBC  Isac Sarna, BS [place], BCPS Clinical Pharmacist Pager [phone] 11/19/2015,10:15 AM

## 2015-11-19 NOTE — Progress Notes (Addendum)
Urine in patients catheter is now more pink tinged than this AM.  One small clot noted in bag.  MD made aware.

## 2015-11-19 NOTE — Progress Notes (Signed)
PROGRESS NOTE    [name]  [id number] DOB: [birth year] DOA: 11/16/2015 PCP: Eustaquio Maize, MD  And Urologist Tresa Endo in [place].   Brief Narrative:  Patient is a 75 year old man with a history of chronic systolic congestive heart failure with an EF of 30-35%, CAD, atrial fibrillation on Coumadin, status post pacemaker, and recent hernia surgery 5 weeks ago. He was also started on Keflex by his PCP for an UTI. He developed urinary retention following the surgery and had an outpatient indwelling Foley catheter. The Foley catheter was discontinued last week. The patient presented to the ED on 11/16/15 with the inability to void. CT of the abdomen and pelvis revealed a 4 cm bladder mass consistent with urothelial carcinoma and asymmetric enhancement and fullness of the right prostate concerning for carcinoma. He was admitted for further evaluation and management.   Assessment & Plan:   Principal Problem:   Urinary retention Active Problems:   Hyponatremia   Bladder mass   Urothelial carcinoma (HCC)   UTI (urinary tract infection) due to Enterococcus   Hypertension   GERD (gastroesophageal reflux disease)   Atrial fibrillation-Permanent   Chronic anticoagulation   Chronic systolic congestive heart failure (HCC)   Elevated PSA   Thrombocytopenia (HCC)   1. Urinary retention. Patient is followed by urologist Dr. Tresa Endo for recent history of urinary retention. The Foley catheter was discontinued last week, but the patient subsequently developed recurrent urinary retention. Indwelling Foley catheter was reinserted in the ED and it is draining well. -The etiology is likely to the bladder mass seen on the CT, query prostate enlargement or malignancy. -The plan is to discuss the findings further with Dr. Rosana Hoes via phone call. I called Dr. Rosana Hoes on 11/19/15 and left a message with his staff. I am awaiting his phone call.  4 cm bladder mass and right-sided prostate  fullness. Per the radiologist, the findings are consistent with urothelial carcinoma and suspicious for right prostate carcinoma. PSA was ordered and was 15.27. These findings are new to the patient. -Will discuss the findings further with Dr. Rosana Hoes via phone call. He was called on 11/19/15 and a message was left.  Enterococcus urinary tract infection. The patient had been started on Keflex for a UTI. Urine culture from 11/13/15 was positive for enterococcus, not covered by cephalosporins. Rocephin was started, but it will be discontinued pending the urine culture. -Vancomycin was started on 11/19/15, but was discontinued following discussion with pharmacy who recommended ampicillin. -Ampicillin was started. Would favor continuing antibiotic for full 7 days not counting treatment with Keflex. -Urine culture on admission is negative to date.  Hematuria. The patient developed mild hematuria. His urine had been clear, but has been intermittently blood-tinged per nursing. He is anticoagulated with Coumadin. We'll continue Coumadin, but will monitor hematuria closely.  Hyponatremia. The patient's serum sodium was 118 on admission. Etiology is uncertain, but it appears that he is not volume overloaded and is euvolemic. Given his recent history of increase water intake, is possible that he has hyponatremia from water intoxication. -Patient was started on fluid restriction, but it was discontinued in favor of normal saline infusion. -His serum sodium did not improve, so normal saline was discontinued. -His serum osmolality was low at 266. 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 IV Lasix was started. His serum sodium has improved. We'll continue current management.  Hypertension and CAD. Patient is treated chronically with lisinopril, isosorbide mononitrate, and carvedilol. Currently stable. Continue medications.  Chronic systolic congestive heart failure. Echo December 2016  revealed an EF of 30-35%. He is followed by cardiology. His CHF appears to be compensated; he does not appear to be volume overloaded. -Lasix was on hold, but it was restarted in light of SIADH to decrease the free water.  Chronic atrial fibrillation and permanent pacemaker due to history of heart block. He is treated chronically with warfarin and carvedilol for rate control. His INR was therapeutic on admission. It has decreased to below therapeutic range. We'll continue Coumadin per pharmacy with adjustments needed.  Thrombocytopenia. Patient's platelet count was 125 on admission. It was 145 one month ago and within normal limits 4 months ago. Etiology is unknown, TSH and free T4 were ordered and were both within normal limits. -His platelet count has improved. The decrease may have been secondary to the infection.    DVT prophylaxis: Warfarin Code Status: Full code Family Communication: Family not available Disposition Plan: Discharged home in clinically appropriate   Consultants:   None  Procedures:   None  Antimicrobials:   Vancomycin 11/18/15  Rocephin 11/16/15>> 11/18/15    Subjective: Patient is concerned about a little blood seen in his urine. Otherwise he denies chest pain or shortness of breath, nausea vomiting.  Objective: Filed Vitals:   11/18/15 1604 11/18/15 2114 11/19/15 0527 11/19/15 1338  BP: 113/64 117/60 100/61 121/73  Pulse: 78 71 77 73  Temp: 98.9 F (37.2 C) 98.7 F (37.1 C) 98.2 F (36.8 C) 97.9 F (36.6 C)  TempSrc: Oral Oral Oral Oral  Resp: 20 18 17 19   Height:      Weight:      SpO2: 100% 100% 98% 100%    Intake/Output Summary (Last 24 hours) at 11/19/15 1506 Last data filed at 11/19/15 1438  Gross per 24 hour  Intake    153 ml  Output   5150 ml  Net  -4997 ml   Filed Weights   11/16/15 1735 11/17/15 0645  Weight: 85.276 kg (188 lb) 85.322 kg (188 lb 1.6 oz)    Examination:  General exam: Appears calm and comfortable;  Pleasant 75 year old man in no acute distress.  Respiratory system: Clear to auscultation. Respiratory effort normal. Cardiovascular system: Irregular, irregular, with a soft systolic murmur. No pedal edema. Gastrointestinal system: Abdomen is nondistended, soft and mild suprapubic tenderness. No organomegaly or masses felt. Normal bowel sounds heard. GU: Indwelling Foley catheter draining Slightly pink urine. Central nervous system: Alert and oriented. No focal neurological deficits. Extremities: Symmetric 5 x 5 power. Skin: No rashes, lesions or ulcers Psychiatry: Judgement and insight appear normal. Mood & affect appropriate.     Data Reviewed: I have personally reviewed following labs and imaging studies  CBC:  Recent Labs Lab 11/16/15 2209 11/17/15 0215 11/18/15 0230 11/19/15 0416  WBC 10.3 10.1 7.4 6.5  NEUTROABS 7.8*  --   --   --   HGB 12.2* 11.9* 11.4* 11.6*  HCT 36.2* 34.8* 33.3* 35.5*  MCV 89.2 89.0 88.3 90.6  PLT 125* 112* 110* [id number]   Basic Metabolic Panel:  Recent Labs Lab 11/17/15 0215 11/17/15 1001 11/17/15 1755 11/18/15 0230 11/19/15 0416  NA 123* 122* 122* 121* 131*  K 4.5 4.4 4.7 4.3 4.3  CL 89* 93* 93* 94* 97*  CO2 27 25 27 25 27   GLUCOSE 94 178* 139* 92 87  BUN 16 15 19  21* 18  CREATININE 0.91 0.75 0.94 0.88 0.80  CALCIUM 8.1* 8.0* 7.8* 7.5* 8.1*   GFR: Estimated  Creatinine Clearance: 85 mL/min (by C-G formula based on Cr of 0.8). Liver Function Tests:  Recent Labs Lab 11/16/15 2209  AST 21  ALT 17  ALKPHOS 92  BILITOT 1.7*  PROT 6.4*  ALBUMIN 3.6   No results for input(s): LIPASE, AMYLASE in the last 168 hours. No results for input(s): AMMONIA in the last 168 hours. Coagulation Profile:  Recent Labs Lab 11/17/15 0215 11/18/15 0230 11/19/15 0416  INR 2.24* 1.91* 1.79*   Cardiac Enzymes: No results for input(s): CKTOTAL, CKMB, CKMBINDEX, TROPONINI in the last 168 hours. BNP (last 3 results) No results for input(s): PROBNP in  the last 8760 hours. HbA1C: No results for input(s): HGBA1C in the last 72 hours. CBG: No results for input(s): GLUCAP in the last 168 hours. Lipid Profile: No results for input(s): CHOL, HDL, LDLCALC, TRIG, CHOLHDL, LDLDIRECT in the last 72 hours. Thyroid Function Tests:  Recent Labs  11/18/15 0959  TSH 1.702   Anemia Panel:  Recent Labs  11/18/15 0959  VITAMINB12 590   Sepsis Labs: No results for input(s): PROCALCITON, LATICACIDVEN in the last 168 hours.  Recent Results (from the past 240 hour(s))  Microscopic Examination     Status: Abnormal   Collection Time: 11/13/15 11:46 AM  Result Value Ref Range Status   WBC, UA >30 (A) 0 -  5 /hpf Final   RBC, UA 3-10 (A) 0 -  2 /hpf Final   Epithelial Cells (non renal) None seen 0 - 10 /hpf Final   Bacteria, UA Few None seen/Few Final  Urine culture     Status: Abnormal   Collection Time: 11/13/15  3:16 PM  Result Value Ref Range Status   Urine Culture, Routine Final report (A)  Final   Urine Culture result 1 Enterococcus faecalis (A)  Final    Comment: Greater than 100,000 colony forming units per mL Note: this isolate is vancomycin-susceptible. This information is provided for epidemiologic purposes only: vancomycin is not among the antibiotics recommended for therapy of urinary tract infections caused by Enterococcus.    RESULT 2 Comment (A)  Final    Comment: Beta hemolytic Streptococcus, group B Greater than 100,000 colony forming units per mL Penicillin and ampicillin are drugs of choice for treatment of beta-hemolytic streptococcal infections. Susceptibility testing of penicillins and other beta-lactam agents approved by the FDA for treatment of beta-hemolytic streptococcal infections need not be performed routinely because nonsusceptible isolates are extremely rare in any beta-hemolytic streptococcus and have not been reported for Streptococcus pyogenes (group A). (CLSI 2011)    ANTIMICROBIAL SUSCEPTIBILITY  Comment  Final    Comment:       ** S = Susceptible; I = Intermediate; R = Resistant **                    P = Positive; N = Negative             MICS are expressed in micrograms per mL    Antibiotic                 RSLT#1    RSLT#2    RSLT#3    RSLT#4 Ciprofloxacin                  R Levofloxacin                   R Nitrofurantoin                 S Penicillin  S Tetracycline                   R Vancomycin                     S   Urine culture     Status: None   Collection Time: 11/16/15  6:15 PM  Result Value Ref Range Status   Specimen Description URINE, CLEAN CATCH  Final   Special Requests NONE  Final   Culture NO GROWTH Performed at [facility]   Final   Report Status 11/18/2015 FINAL  Final         Radiology Studies: No results found.      Scheduled Meds: . ampicillin (OMNIPEN) IV  1 g Intravenous Q6H  . carvedilol  6.25 mg Oral BID  . doxepin  25 mg Oral q morning - 10a   And  . doxepin  50 mg Oral QHS  . furosemide  20 mg Intravenous Daily  . gabapentin  100 mg Oral QID  . isosorbide mononitrate  60 mg Oral Daily  . lisinopril  5 mg Oral QODAY  . multivitamin-lutein  2 capsule Oral Daily  . pantoprazole  40 mg Oral Daily  . polyethylene glycol  17 g Oral Daily  . rosuvastatin  10 mg Oral Daily  . senna  1 tablet Oral BID  . sodium chloride flush  3 mL Intravenous Q12H  . sodium chloride flush  3 mL Intravenous Q12H  . warfarin  5 mg Oral QPM  . Warfarin - Pharmacist Dosing Inpatient   Does not apply q1800   Continuous Infusions:     LOS: 2 days    Time spent: 48 minutes    Rexene Alberts, MD [facility] Pager [phone]  If 7PM-7AM, please contact night-coverage [email or web address] Password [email or web address] 11/19/2015, 3:06 PM

## 2015-11-20 ENCOUNTER — Encounter (HOSPITAL_COMMUNITY): Payer: Self-pay | Admitting: Internal Medicine

## 2015-11-20 LAB — CBC
HEMATOCRIT: 36.4 % — AB (ref 39.0–52.0)
HEMOGLOBIN: 11.8 g/dL — AB (ref 13.0–17.0)
MCH: 29.4 pg (ref 26.0–34.0)
MCHC: 32.4 g/dL (ref 30.0–36.0)
MCV: 90.8 fL (ref 78.0–100.0)
Platelets: 156 10*3/uL (ref 150–400)
RBC: 4.01 MIL/uL — AB (ref 4.22–5.81)
RDW: 14.3 % (ref 11.5–15.5)
WBC: 5.1 10*3/uL (ref 4.0–10.5)

## 2015-11-20 LAB — BASIC METABOLIC PANEL
Anion gap: 8 (ref 5–15)
BUN: 18 mg/dL (ref 6–20)
CALCIUM: 8.4 mg/dL — AB (ref 8.9–10.3)
CO2: 28 mmol/L (ref 22–32)
Chloride: 97 mmol/L — ABNORMAL LOW (ref 101–111)
Creatinine, Ser: 0.73 mg/dL (ref 0.61–1.24)
GFR calc Af Amer: >60 mL/min (ref >60)
GLUCOSE: 84 mg/dL (ref 65–99)
POTASSIUM: 4.1 mmol/L (ref 3.5–5.1)
Sodium: 133 mmol/L — ABNORMAL LOW (ref 135–145)

## 2015-11-20 LAB — PROTIME-INR
INR: 1.81 — AB (ref 0.00–1.49)
PROTHROMBIN TIME: 20.9 s — AB (ref 11.6–15.2)

## 2015-11-20 MED ORDER — SENNA 8.6 MG PO TABS: 1.0000 | ORAL_TABLET | Freq: Two times a day (BID) | ORAL | Status: DC

## 2015-11-20 MED ORDER — FUROSEMIDE 20 MG PO TABS
20.0000 mg | ORAL_TABLET | Freq: Every day | ORAL | Status: DC
  Administered 2015-11-20: 20 mg via ORAL
  Filled 2015-11-20: qty 1

## 2015-11-20 MED ORDER — CARVEDILOL 3.125 MG PO TABS
3.1250 mg | ORAL_TABLET | Freq: Two times a day (BID) | ORAL | Status: DC
  Administered 2015-11-20: 3.125 mg via ORAL
  Filled 2015-11-20: qty 1

## 2015-11-20 MED ORDER — FUROSEMIDE 20 MG PO TABS: 20.0000 mg | ORAL_TABLET | Freq: Every day | ORAL | Status: DC

## 2015-11-20 MED ORDER — AMOXICILLIN-POT CLAVULANATE 500-125 MG PO TABS: 1.0000 | ORAL_TABLET | Freq: Two times a day (BID) | ORAL | Status: DC

## 2015-11-20 NOTE — Progress Notes (Signed)
ANTICOAGULATION CONSULT NOTE -   Pharmacy Consult for Warfarin (chronic Rx PTA).  Pt states only Ervin Knack works for him  Indication: atrial fibrillation  Allergies  Allergen Reactions  . Hydrocodone-Acetaminophen Itching  . Prednisone Itching  . Sulfa Drugs Cross Reactors Other (See Comments)    unknown  . Ultram [Tramadol Hcl] Itching    "don't remember how bad"  . Xarelto [Rivaroxaban] Other (See Comments)    Itching   . Zithromax [Azithromycin] Itching    Itching     Patient Measurements: Height: 5\' 11"  (180.3 cm) Weight: 188 lb 1.6 oz (85.322 kg) IBW/kg (Calculated) : 75.3  Vital Signs: Temp: 97.7 F (36.5 C) (07/21 0429) Temp Source: Oral (07/21 0429) BP: 97/57 mmHg (07/21 0429) Pulse Rate: 73 (07/21 0429)  Labs:  Recent Labs  11/18/15 0230 11/19/15 0416 11/20/15 0420  HGB 11.4* 11.6* 11.8*  HCT 33.3* 35.5* 36.4*  PLT 110* 155 156  LABPROT 21.8* 20.8* 20.9*  INR 1.91* 1.79* 1.81*  CREATININE 0.88 0.80 0.73   Estimated Creatinine Clearance: 85 mL/min (by C-G formula based on Cr of 0.73).  Medical History: Past Medical History  Diagnosis Date  . Ischemic cardiomyopathy     a. 06/2012 EF 25% by echo.  . Hypertension   . Hyperlipidemia   . GERD (gastroesophageal reflux disease)   . Atrial flutter ([place])     a. 01/2011 s/p unsuccessful RFCA complicated by CHB req PPM.  . CHB (complete heart block) ([place])     a. 01/2011: in setting of RFCA, s/p SJM 2110 [employer] DC PPM, ser # [id number].  [name] Kitchen Kidney cysts     a. bilateral  . Hiatal hernia     a. 03/01/2013 EGD and esoph dil.  [name] Kitchen BPH (benign prostatic hyperplasia)     a. elevated PSA  . A-fib (HCC)     a. chronic coumadin  . Elevated PSA, less than 10 ng/ml   . Fibromyalgia   . Skin cancer     'above left eyebrow; tip of my nose" (08/29/2012)  . Depression     "since 1986-1987" (08/29/2012)  . Anxiety   . Panic attacks   . Chronic systolic CHF (congestive heart failure), NYHA class 2 ([place])     a. 06/2012  Echo: EF 25%, distal sept and apical AK, sev dil LV, mild LVH, mod dil LA.  [name] Kitchen Pacemaker     explanted 06/15/15 >>> CRT-D STJ device, Dr. Caryl Comes  . CAD (coronary artery disease)     a. 1991 s/p CABGx4;  b. redo CABG in 2007 (SVG->OM, VG->PDA->PLV);  c. 01/2011 Cath: LM 80ost, LAD 100p, LCX 100ost, OM1 100, RCA 100, LIMA->LAD ok, VG->PDA ok, VG->OM ok;  d. 03/2012 MV: EF 32% inf & inflat scar w/o ischemia; e. Lexi MV: EF 33%, inf infarct, no ischemia. f. lexiscan 06/12/2014 RCA scar, no ischemia, EF 26%   Medications:  Prescriptions prior to admission  Medication Sig Dispense Refill Last Dose  . acetaminophen (TYLENOL) 500 MG tablet Take 500 mg by mouth every 8 (eight) hours as needed for mild pain.    11/16/2015 at 1630  . ALPRAZolam (XANAX) 1 MG tablet Take 1 tablet (1 mg total) by mouth 3 (three) times daily as needed. For anxiety 270 tablet 1 11/16/2015 at Unknown time  . carvedilol (COREG) 6.25 MG tablet Take 1 tablet (6.25 mg total) by mouth 2 (two) times daily. 180 tablet 2 11/16/2015 at 1700  . cephALEXin (KEFLEX) 500 MG capsule Take 1 capsule (500 mg total)  by mouth 4 (four) times daily. 28 capsule 0 11/16/2015 at Unknown time  . doxepin (SINEQUAN) 25 MG capsule Take 25 mg (1 capsule) by mouth in the morning and 50 mg (2 capsules) by mouth at night. (Patient taking differently: Take 25-50 mg by mouth 2 (two) times daily. Take 25 mg (1 capsule) by mouth in the morning and 50 mg (2 capsules) by mouth at night.) 270 capsule 3 11/16/2015 at Unknown time  . fluticasone (FLONASE) 50 MCG/ACT nasal spray Place 2 sprays into both nostrils daily. 16 g 6 11/16/2015 at Unknown time  . furosemide (LASIX) 20 MG tablet Take 1 tablet (20 mg total) by mouth daily as needed for fluid. One tablet by mouth daily as needed for swelling 90 tablet 1 11/13/2015 at Unknown time  . gabapentin (NEURONTIN) 100 MG capsule Take 100 mg by mouth 4 (four) times daily.   11/16/2015 at Unknown time  . guaiFENesin (MUCINEX) 600 MG 12 hr  tablet Take 600 mg by mouth 2 (two) times daily. Reported on 11/12/2015   11/16/2015 at Unknown time  . hydroxypropyl methylcellulose / hypromellose (ISOPTO TEARS / GONIOVISC) 2.5 % ophthalmic solution Place 1 drop into both eyes 3 (three) times daily as needed for dry eyes.   Past Week at --------------------------------------------------------------------------------------------------------------------------------  . isosorbide mononitrate (IMDUR) 60 MG 24 hr tablet Take 1 tablet (60 mg total) by mouth daily. 90 tablet 2 11/16/2015 at Unknown time  . lisinopril (PRINIVIL,ZESTRIL) 5 MG tablet Take 1 tablet (5 mg total) by mouth every other day. 90 tablet 3 11/16/2015 at Unknown time  . loratadine (CLARITIN) 10 MG tablet Take 10 mg by mouth daily.   11/16/2015 at Unknown time  . Multiple Vitamins-Minerals (PRESERVISION AREDS 2) CAPS Take 2 capsules by mouth daily. Reported on 06/26/2015   11/16/2015 at Unknown time  . nitroGLYCERIN (NITROSTAT) 0.4 MG SL tablet DISSOLVE ONE TABLET UNDER THE TONGUE EVERY 5 MINUTES AS NEEDED FOR CHEST PAIN.  DO NOT EXCEED A TOTAL OF 3 DOSES IN 15 MINUTES 100 tablet 3 unknown  . omeprazole (PRILOSEC) 20 MG capsule Take 1 capsule (20 mg total) by mouth 2 (two) times daily. 180 capsule 3 11/16/2015 at Unknown time  . polyethylene glycol (MIRALAX / GLYCOLAX) packet Take 17 g by mouth daily.    11/16/2015 at Unknown time  . rosuvastatin (CRESTOR) 10 MG tablet Take 1 tablet (10 mg total) by mouth daily. 90 tablet 3 11/16/2015 at Unknown time  . warfarin (JANTOVEN) 5 MG tablet Take 5 mg by mouth every evening.   11/15/2015 at 2100    Assessment: 75yo male on chronic Warfarin for h/o afib.  Pt states that only [place] works for him.  Pt will use own supply from home as Jantoven not stocked at [facility]. INR today is 1.81.  He did miss a dose of coumadin on 7/17.   Goal of Therapy:  INR 2-3 Monitor platelets by anticoagulation protocol: Yes   Plan:  Warfarin 5mg  po today x 1 (pt's home  dose) Daily PT/INR Monitor for s/s of bleeding and CBC  Thanks for allowing pharmacy to be a part of this patient's care.  Excell Seltzer, PharmD Clinical Pharmacist 11/20/2015,8:41 AM

## 2015-11-20 NOTE — Progress Notes (Signed)
Dc home via wc with son

## 2015-11-20 NOTE — Care Management Note (Signed)
Case Management Note  Patient Details  Name: [name] MRN: [id number] Date of Birth: [birth year]  Subjective/Objective:  Patient from home, states he lives with daughter. He has a walker and cane at home if needed. Will go home with foley catheter, states he has had one before and feels he can handle "just fine".                 He has a PCP and insurance, reports no issues.   Action/Plan: DC home with self care. No CM needs.    Expected Discharge Date:                 11/20/2015 Expected Discharge Plan:  Home/Self Care  In-House Referral:  NA  Discharge planning Services  CM Consult  Post Acute Care Choice:  NA Choice offered to:  NA  DME Arranged:    DME Agency:     HH Arranged:    HH Agency:     Status of Service:  Completed, signed off  If discussed at [employer] of Stay Meetings, dates discussed:    Additional Comments:  [name], Chauncey Reading, RN 11/20/2015, 1:08 PM

## 2015-11-20 NOTE — Care Management Important Message (Signed)
Important Message  Patient Details  Name: [name] MRN: [id number] Date of Birth: Dec 21, 1940   Medicare Important Message Given:  Yes    Devinne Epstein, Chauncey Reading, RN 11/20/2015, 10:18 AM

## 2015-11-20 NOTE — Discharge Summary (Signed)
Physician Discharge Summary  [name] [id number] DOB: 02/18/1941 DOA: 11/16/2015  PCP: Eustaquio Maize, MD  Admit date: 11/16/2015 Discharge date: 11/20/2015  Time spent: Greater than 30 minutes  Recommendations for Outpatient Follow-up:  1. Patient was discharged with the Foley catheter in place. He will follow-up with Dr. Rosana Hoes or his PA next week. Apparently, Dr. Shann Medal office called the patient in his hospital room to schedule the appointment. 2. Patient will need to have his serum sodium rechecked at the hospital follow-up appointment.   Discharge Diagnoses:  1. Urinary retention. 2. 4 cm bladder mass and right-sided prostate fullness. 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 3. Enterococcus urinary tract infection. 4. Hyponatremia. 5. Hypertension. Remains stable. 6. CAD remained stable. 7. Chronic systolic congestive heart failure. Remained compensated. 8. Chronic atrial fibrillation on anticoagulation with warfarin. 9. 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 10. Thrombocytopenia.  Discharge Condition: Improved.  Diet recommendation: Heart healthy.  Filed Weights   11/16/15 1735 11/17/15 0645  Weight: 85.276 kg (188 lb) 85.322 kg (188 lb 1.6 oz)    History of present illness:  Patient is a 75 year old man with a history of chronic systolic congestive heart failure with an EF of 30-35%, CAD, atrial fibrillation on Coumadin, status post pacemaker, and recent hernia surgery 5 weeks ago. He was also started on Keflex by his PCP for an UTI. He developed urinary retention following the surgery and had an outpatient indwelling Foley catheter. The Foley catheter was discontinued last week. The patient presented to the ED on 11/16/15 with the inability to void. CT of the abdomen and pelvis revealed a 4 cm bladder mass consistent with urothelial carcinoma and asymmetric enhancement and fullness of the right prostate concerning for carcinoma. He  was admitted for further evaluation and management.  Hospital Course:  1. Urinary retention. Patient is followed by urologist Dr. Tresa Endo for recent history of urinary retention. The Foley catheter was discontinued last week, but the patient subsequently developed recurrent urinary retention. Indwelling Foley catheter was reinserted in the ED and it drained well. -The etiology was likely to the bladder mass or prostate enlargement seen on the CT. - I called Dr. Rosana Hoes on 11/19/15 and left a message with his staff. The patient will be scheduled to see Dr. Shann Medal PA next week. Patient was discharged with a Foley catheter in place.  4 cm bladder mass and right-sided prostate fullness. Per the radiologist, the findings were consistent with urothelial carcinoma and suspicious for right prostate carcinoma. PSA was ordered and was 15.27. He will follow-up at Dr. Shann Medal office next week as scheduled.  Enterococcus urinary tract infection. The patient was started on Keflex for a UTI. Urine culture from 11/13/15 was positive for enterococcus, not covered by cephalosporins. Rocephin was started, but it was discontinued. -Vancomycin was started on 11/19/15, but was discontinued following discussion with pharmacy who recommended ampicillin. -Ampicillin was started. The follow-up urine culture was negative, but because the patient had incomplete treatment, he was continued on Augmentin twice a day for 7 more days at time of discharge. This is in the setting of a bladder mass and indwelling Foley catheter.  Hematuria. The patient developed mild hematuria. His urine had been clear, but was intermittently blood-tinged per nursing. He is anticoagulated with Coumadin. The hematuria resolved.  Hyponatremia. The patient's serum sodium was 118 on admission. Etiology is uncertain, but it appears that he is not volume overloaded and is euvolemic. Given his recent history of  increase water intake, is possible that he  has hyponatremia from water intoxication. -Patient was started on fluid restriction. His serum osmolality was low at 266. 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 IV Lasix was started. His serum sodium improved to 133. He was discharged on daily Lasix although he had been taken it intermittently at home. He was instructed to drink no more than a liter and a half of fluids daily.  Hypertension and CAD. Patient is treated chronically with lisinopril, isosorbide mononitrate, and carvedilol. They were continued. He remained stable.  Chronic systolic congestive heart failure. Echo December 2016 revealed an EF of 30-35%. He is followed by cardiology. His CHF appears to be compensated; he did not appear to be volume overloaded. He takes Lasix as needed. -Lasix was initially hold, but it was restarted to decrease the free water in hyponatremia.  Chronic atrial fibrillation and permanent pacemaker due to history of heart block. He is treated chronically with warfarin and carvedilol for rate control. His INR was therapeutic on admission. It has decreased to below therapeutic range before discharge, but he was discharged on his normal home dose. His INR was 1.81 at the time of discharge.  Thrombocytopenia. Patient's platelet count was 125 on admission. It was 145 one month ago and within normal limits 4 months ago. Etiology is unknown, TSH and free T4 were ordered and were both within normal limits. -His platelet count improved. The decrease may have been secondary to the infection.  Procedures:  None  Consultations:  None  Discharge Exam: Filed Vitals:   11/20/15 1031 11/20/15 1422  BP: 120/70 118/70  Pulse:  70  Temp:  98.5 F (36.9 C)  Resp:  20   General exam: Appears calm and comfortable; Pleasant 75 year old man in no acute distress.  Respiratory system: Clear to auscultation. Respiratory effort normal. Cardiovascular system: Irregular, irregular, with a soft  systolic murmur. No pedal edema. Gastrointestinal system: Abdomen is nondistended, soft and mild suprapubic tenderness. No organomegaly or masses felt. Normal bowel sounds heard. GU: Indwelling Foley catheter draining clear urine. Central nervous system: Alert and oriented. No focal neurological deficits. Extremities: Symmetric 5 x 5 power. Skin: No rashes, lesions or ulcers Psychiatry: Judgement and insight appear normal. Mood & affect appropriate.  Discharge Instructions   Discharge Instructions    Diet general    Complete by:  As directed      Discharge instructions    Complete by:  As directed   [place] SEE DR. DAVIS OR HIS PA. TAKE MEDICATIONS AS PRESCRIBED.     Increase activity slowly    Complete by:  As directed           Current Discharge Medication List    START taking these medications   Details  amoxicillin-clavulanate (AUGMENTIN) 500-125 MG tablet Take 1 tablet (500 mg total) by mouth 2 (two) times daily. Qty: 14 tablet, Refills: 0    senna (SENOKOT) 8.6 MG TABS tablet Take 1 tablet (8.6 mg total) by mouth 2 (two) times daily.      CONTINUE these medications which have CHANGED   Details  furosemide (LASIX) 20 MG tablet Take 1 tablet (20 mg total) by mouth daily. Qty: 30 tablet, Refills: 1   Associated Diagnoses: Edema, unspecified type      CONTINUE these medications which have NOT CHANGED   Details  acetaminophen (TYLENOL) 500 MG tablet Take 500 mg by mouth every 8 (eight) hours as needed  for mild pain.     ALPRAZolam (XANAX) 1 MG tablet Take 1 tablet (1 mg total) by mouth 3 (three) times daily as needed. For anxiety Qty: 270 tablet, Refills: 1   Associated Diagnoses: Anxiety    carvedilol (COREG) 6.25 MG tablet Take 1 tablet (6.25 mg total) by mouth 2 (two) times daily. Qty: 180 tablet, Refills: 2    doxepin (SINEQUAN) 25 MG capsule Take 25 mg (1 capsule) by mouth in the morning and 50 mg (2 capsules) by mouth at  night. Qty: 270 capsule, Refills: 3   Associated Diagnoses: Anxiety    fluticasone (FLONASE) 50 MCG/ACT nasal spray Place 2 sprays into both nostrils daily. Qty: 16 g, Refills: 6   Associated Diagnoses: Allergic rhinitis, unspecified allergic rhinitis type    gabapentin (NEURONTIN) 100 MG capsule Take 100 mg by mouth 4 (four) times daily.    guaiFENesin (MUCINEX) 600 MG 12 hr tablet Take 600 mg by mouth 2 (two) times daily. Reported on 11/12/2015    hydroxypropyl methylcellulose / hypromellose (ISOPTO TEARS / GONIOVISC) 2.5 % ophthalmic solution Place 1 drop into both eyes 3 (three) times daily as needed for dry eyes.    isosorbide mononitrate (IMDUR) 60 MG 24 hr tablet Take 1 tablet (60 mg total) by mouth daily. Qty: 90 tablet, Refills: 2    lisinopril (PRINIVIL,ZESTRIL) 5 MG tablet Take 1 tablet (5 mg total) by mouth every other day. Qty: 90 tablet, Refills: 3    loratadine (CLARITIN) 10 MG tablet Take 10 mg by mouth daily.    Multiple Vitamins-Minerals (PRESERVISION AREDS 2) CAPS Take 2 capsules by mouth daily. Reported on 06/26/2015    nitroGLYCERIN (NITROSTAT) 0.4 MG SL tablet DISSOLVE ONE TABLET UNDER THE TONGUE EVERY 5 MINUTES AS NEEDED FOR CHEST PAIN.  DO NOT EXCEED A TOTAL OF 3 DOSES IN 15 MINUTES Qty: 100 tablet, Refills: 3    omeprazole (PRILOSEC) 20 MG capsule Take 1 capsule (20 mg total) by mouth 2 (two) times daily. Qty: 180 capsule, Refills: 3    polyethylene glycol (MIRALAX / GLYCOLAX) packet Take 17 g by mouth daily.     rosuvastatin (CRESTOR) 10 MG tablet Take 1 tablet (10 mg total) by mouth daily. Qty: 90 tablet, Refills: 3    warfarin (JANTOVEN) 5 MG tablet Take 5 mg by mouth every evening.      STOP taking these medications     cephALEXin (KEFLEX) 500 MG capsule        Allergies  Allergen Reactions  . Hydrocodone-Acetaminophen Itching  . Prednisone Itching  . Sulfa Drugs Cross Reactors Other (See Comments)    unknown  . Ultram [Tramadol Hcl]  Itching    "don't remember how bad"  . Xarelto [Rivaroxaban] Other (See Comments)    Itching   . Zithromax [Azithromycin] Itching    Itching    Follow-up Information    Follow up with DAVIS III, RONALD L, MD.   Specialty:  Urology   Why:  FOLLOW-UP WITH HIS P.A. AS SCHEDULED NEXT WEEK.   Contact information:   [place] [id number] [phone]        The results of significant diagnostics from this hospitalization (including imaging, microbiology, ancillary and laboratory) are listed below for reference.    Significant Diagnostic Studies: Ct Abdomen Pelvis W Contrast  11/16/2015  CLINICAL DATA:  Difficulty urinating.  Left flank pain. EXAM: CT ABDOMEN AND PELVIS WITH CONTRAST TECHNIQUE: Multidetector CT imaging of the abdomen and pelvis was performed using  the standard protocol following bolus administration of intravenous contrast. CONTRAST:  135mL ISOVUE-300 IOPAMIDOL (ISOVUE-300) INJECTION 61% COMPARISON:  04/04/2014 FINDINGS: Scan was performed in the late arterial phase; advantageous for evaluating bladder mass. Lower chest and abdominal wall: Probable prior right inguinal hernia repair. Biventricular ICD/ pacer. Left ventricular thinning and subendocardial low density from previous infarction. Hepatobiliary: No focal liver abnormality.Cholecystectomy. Negative common bile duct. Pancreas: Unremarkable. Spleen: Unremarkable. Adrenals/Urinary Tract: Negative adrenals. No hydronephrosis or stone. Bilateral renal cortical cysts, measuring up to 69 mm from the interpolar left kidney. This cyst has a thin benign-appearing septation. High-density mass from the left aspect of the bladder fundus which appears centered in the wall, but also exophytic into the lumen, measuring approximately 4 cm. High-density mainly attributed to avid enhancement, although there could also be superimposed mineralization. No concerning pelvic lymph nodes. The remainder of the bladder wall is  thickened, nonspecific. Small volume pneumaturia, with presumed recent sampling. Stomach/Bowel: Moderate volume of stool without obstruction or rectal impaction. Clip seen in the sigmoid colon. No appendicitis. Reproductive:There is asymmetric heterogeneous enhancement fullness of the right prostate gland. Vascular/Lymphatic: Extensive atherosclerosis of the aorta and branch vessels. Replaced right hepatic artery. No acute vascular finding. There is retroperitoneal edema that is likely from volume overload. Other: No ascites or pneumoperitoneum. Musculoskeletal: No acute abnormalities. No sclerotic or lytic bone lesion. IMPRESSION: 1. 4 cm bladder mass consistent with urothelial carcinoma. The mass has a transmural appearance. No evidence of metastatic disease. 2. Asymmetric enhancement and fullness of the right prostate concerning for carcinoma. Please correlate with PSA. 3. History of urinary retention. Currently there is physiologic distension of the bladder. Nonspecific bladder wall thickening, correlate with urinalysis. 4. Retroperitoneal edema, likely volume overload. Electronically Signed   By: Monte Fantasia M.D.   On: 11/16/2015 23:05   Dg Foot Complete Left  11/04/2015  CLINICAL DATA:  Heel pain for 3 weeks EXAM: LEFT FOOT - COMPLETE 3+ VIEW COMPARISON:  None. FINDINGS: There is no evidence of fracture or dislocation. There is mild osteoarthritis of the first MTP joint. Soft tissues are unremarkable. IMPRESSION: No acute osseous injury of the left foot. Electronically Signed   By: Kathreen Devoid   On: 11/04/2015 12:29    Microbiology: Recent Results (from the past 240 hour(s))  Microscopic Examination     Status: Abnormal   Collection Time: 11/13/15 11:46 AM  Result Value Ref Range Status   WBC, UA >30 (A) 0 -  5 /hpf Final   RBC, UA 3-10 (A) 0 -  2 /hpf Final   Epithelial Cells (non renal) None seen 0 - 10 /hpf Final   Bacteria, UA Few None seen/Few Final  Urine culture     Status: Abnormal    Collection Time: 11/13/15  3:16 PM  Result Value Ref Range Status   Urine Culture, Routine Final report (A)  Final   Urine Culture result 1 Enterococcus faecalis (A)  Final    Comment: Greater than 100,000 colony forming units per mL Note: this isolate is vancomycin-susceptible. This information is provided for epidemiologic purposes only: vancomycin is not among the antibiotics recommended for therapy of urinary tract infections caused by Enterococcus.    RESULT 2 Comment (A)  Final    Comment: Beta hemolytic Streptococcus, group B Greater than 100,000 colony forming units per mL Penicillin and ampicillin are drugs of choice for treatment of beta-hemolytic streptococcal infections. Susceptibility testing of penicillins and other beta-lactam agents approved by the FDA for treatment of beta-hemolytic streptococcal  infections need not be performed routinely because nonsusceptible isolates are extremely rare in any beta-hemolytic streptococcus and have not been reported for Streptococcus pyogenes (group A). (CLSI 2011)    ANTIMICROBIAL SUSCEPTIBILITY Comment  Final    Comment:       ** S = Susceptible; I = Intermediate; R = Resistant **                    P = Positive; N = Negative             MICS are expressed in micrograms per mL    Antibiotic                 RSLT#1    RSLT#2    RSLT#3    RSLT#4 Ciprofloxacin                  R Levofloxacin                   R Nitrofurantoin                 S Penicillin                     S Tetracycline                   R Vancomycin                     S   Urine culture     Status: None   Collection Time: 11/16/15  6:15 PM  Result Value Ref Range Status   Specimen Description URINE, CLEAN CATCH  Final   Special Requests NONE  Final   Culture NO GROWTH Performed at [facility]   Final   Report Status 11/18/2015 FINAL  Final     Labs: Basic Metabolic Panel:  Recent Labs Lab 11/17/15 1001 11/17/15 1755 11/18/15 0230  11/19/15 0416 11/20/15 0420  NA 122* 122* 121* 131* 133*  K 4.4 4.7 4.3 4.3 4.1  CL 93* 93* 94* 97* 97*  CO2 25 27 25 27 28   GLUCOSE 178* 139* 92 87 84  BUN 15 19 21* 18 18  CREATININE 0.75 0.94 0.88 0.80 0.73  CALCIUM 8.0* 7.8* 7.5* 8.1* 8.4*   Liver Function Tests:  Recent Labs Lab 11/16/15 2209  AST 21  ALT 17  ALKPHOS 92  BILITOT 1.7*  PROT 6.4*  ALBUMIN 3.6   No results for input(s): LIPASE, AMYLASE in the last 168 hours. No results for input(s): AMMONIA in the last 168 hours. CBC:  Recent Labs Lab 11/16/15 2209 11/17/15 0215 11/18/15 0230 11/19/15 0416 11/20/15 0420  WBC 10.3 10.1 7.4 6.5 5.1  NEUTROABS 7.8*  --   --   --   --   HGB 12.2* 11.9* 11.4* 11.6* 11.8*  HCT 36.2* 34.8* 33.3* 35.5* 36.4*  MCV 89.2 89.0 88.3 90.6 90.8  PLT 125* 112* 110* 155 156   Cardiac Enzymes: No results for input(s): CKTOTAL, CKMB, CKMBINDEX, TROPONINI in the last 168 hours. BNP: BNP (last 3 results) No results for input(s): BNP in the last 8760 hours.  ProBNP (last 3 results) No results for input(s): PROBNP in the last 8760 hours.  CBG: No results for input(s): GLUCAP in the last 168 hours.     Signed:  Keyanna Sandefer MD.  [facility] 11/20/2015, 2:24 PM

## 2015-11-20 NOTE — Progress Notes (Signed)
LEg bag education done and pt switched to leg bag.  Foley education done. Dc instructions done. meds and RX gone over. VSS. A&O. Dc home

## 2015-11-23 ENCOUNTER — Ambulatory Visit (INDEPENDENT_AMBULATORY_CARE_PROVIDER_SITE_OTHER): Payer: Medicare Other | Admitting: Pharmacist

## 2015-11-23 ENCOUNTER — Telehealth: Payer: Self-pay | Admitting: Pediatrics

## 2015-11-23 DIAGNOSIS — I482 Chronic atrial fibrillation, unspecified: Secondary | ICD-10-CM

## 2015-11-23 LAB — COAGUCHEK XS/INR WAIVED
INR: 1.6 — AB (ref 0.9–1.1)
PROTHROMBIN TIME: 19.2 s

## 2015-11-23 NOTE — Telephone Encounter (Signed)
Patient missed 1 dose of warfarin while in hospital last week.  He is due to have protime recheck in 4 days. His INR was 1.81 (11/20/2015) and is taking augmentin   I recommended that he continue with his current warfarin dose and recheck INR earlier since he is taking Augmentin which can increase INR.  Appt for INR moved to tomorrow 11/24/15

## 2015-11-24 ENCOUNTER — Encounter: Payer: Self-pay | Admitting: Pharmacist

## 2015-11-25 ENCOUNTER — Telehealth: Payer: Self-pay | Admitting: Cardiology

## 2015-11-25 NOTE — Telephone Encounter (Signed)
New message    Pt asked to speak with the nurse. Would not give any details. Please call.

## 2015-11-25 NOTE — Telephone Encounter (Signed)
Returned call. Goes to full VM box, unable to leave message.

## 2015-11-26 ENCOUNTER — Telehealth: Payer: Self-pay | Admitting: Pediatrics

## 2015-11-26 ENCOUNTER — Telehealth: Payer: Self-pay | Admitting: Internal Medicine

## 2015-11-26 NOTE — Telephone Encounter (Signed)
Spoke with pt's daughter Sherilyn Banker, she states pt was  diagnosed with bladder Cancer seven weeks ago. Pt is to  have the tumor removed. When it comes to his ICD, pt would like to know if Dr. Caryl Comes think that it will be okay to have the procedure done.

## 2015-11-26 NOTE — Telephone Encounter (Signed)
New Message  Pt call requesting to speak with RN. Pt states he was recently diagnosed with bladder cancer and would like to speak with Rn to see if it would affect his monitor

## 2015-11-26 NOTE — Telephone Encounter (Signed)
Patient wanted me to know that he is not sure when they are going to do biopsy.  I told him to just notify us when it is scheduled so we can discuss holding warfarin.

## 2015-11-27 ENCOUNTER — Encounter: Payer: Self-pay | Admitting: Pharmacist Clinician (PhC)/ Clinical Pharmacy Specialist

## 2015-11-30 NOTE — Telephone Encounter (Signed)
Unable to reach pt or leave a message  

## 2015-11-30 NOTE — Telephone Encounter (Signed)
Yes  There will be a protocol at the hospital for temporary inactivation of the ICD depending on the surgery

## 2015-12-01 ENCOUNTER — Ambulatory Visit: Payer: Medicare Other | Admitting: Pediatrics

## 2015-12-02 ENCOUNTER — Ambulatory Visit: Payer: Medicare Other | Admitting: Pediatrics

## 2015-12-02 NOTE — Telephone Encounter (Signed)
Spoke with pt and daughter Sherilyn Banker. Pt and daughter are aware that Dr. Caryl Comes said yes to the procedure, and that there  will be a protocol at the hospital for temporary inactivation on the ICD depending on the surgery. Pt and daughter verbalized understanding.

## 2015-12-07 ENCOUNTER — Ambulatory Visit (INDEPENDENT_AMBULATORY_CARE_PROVIDER_SITE_OTHER): Payer: Medicare Other | Admitting: Family Medicine

## 2015-12-07 ENCOUNTER — Encounter: Payer: Self-pay | Admitting: Family Medicine

## 2015-12-07 ENCOUNTER — Telehealth: Payer: Self-pay | Admitting: Pediatrics

## 2015-12-07 VITALS — BP 126/76 | HR 68 | Temp 97.3°F | Ht 71.0 in | Wt 183.0 lb

## 2015-12-07 DIAGNOSIS — M545 Low back pain, unspecified: Secondary | ICD-10-CM

## 2015-12-07 NOTE — Progress Notes (Signed)
   HPI  Patient presents today here with back pain  Pt missed his bottom step at home today and caused bilateral low back pain. It happened about 1 PM today. He's had a recent diagnosis of bladder and prostate cancer.  Patient states he has bilateral low back pain described as soreness worse with any movement. He has soreness in the legs but no discrete radiation like sciatica. No weakness, numbness, or tingling.  He did not fall.  PMH: Smoking status noted ROS: Per HPI  Objective: BP 126/76   Pulse 68   Temp 97.3 F (36.3 C) (Oral)   Ht 5\' 11"  (1.803 m)   Wt 183 lb (83 kg)   BMI 25.52 kg/m  Gen: NAD, alert, cooperative with exam HEENT: NCAT CV: RRR, good S1/S2, no murmur Resp: CTABL, no wheezes, non-labored Ext: No edema, warm Neuro: Alert and oriented, No gross deficits  MSK:  Tenderness to palpation of bony landmarks a lumbar thoracic spine, mild tenderness to palpation of bilateral lumbar paraspinal muscles.  Assessment and plan:  # Back pain After missing a step and a jolt to the back. With concurrent bladder and prostate cancer, maintain low threshold for CT of the back,. Plain film ordered, patient will return tomorrow for that. Recommended 2 Tylenol 3 times a day for this week, heat and gentle massage. Low threshold for return and/or stronger pain medications. Tramadol allergy. He had a bad reaction to muscle relaxers previously. Will avoid these for now.      Orders Placed This Encounter  Procedures  . DG Lumbar Spine 2-3 Views    Standing Status:   Future    Standing Expiration Date:   02/05/2017    Order Specific Question:   Reason for Exam (SYMPTOM  OR DIAGNOSIS REQUIRED)    Answer:   back pain    Order Specific Question:   Preferred imaging location?    Answer:   Internal     Laroy Apple, MD [facility] 12/07/2015, 5:30 PM

## 2015-12-07 NOTE — Telephone Encounter (Signed)
Pt slipped on step and pulled muscles in back Wants appt for back pain appt scheduled

## 2015-12-08 ENCOUNTER — Ambulatory Visit (INDEPENDENT_AMBULATORY_CARE_PROVIDER_SITE_OTHER): Payer: Medicare Other

## 2015-12-08 DIAGNOSIS — M545 Low back pain, unspecified: Secondary | ICD-10-CM

## 2015-12-10 ENCOUNTER — Telehealth: Payer: Self-pay | Admitting: Internal Medicine

## 2015-12-10 ENCOUNTER — Telehealth: Payer: Self-pay | Admitting: Pharmacist

## 2015-12-10 NOTE — Telephone Encounter (Signed)
Recommendations for holding warfarin 5 days prior to surgery have been faxed to Dr Tresa Endo' office.  Patient is also aware of recommendations.  His next scheduled protime is next week.  Hopefully will know surgery date and can give specific instructions of dates.

## 2015-12-10 NOTE — Telephone Encounter (Addendum)
Per note from less than an hour prior to this call, pt was already informed to hold his Coumadin for 5 days prior to surgery and restart the day after. Patient reported that he is already aware.  States surgery date has not been scheduled yet because he needs to be cleared by Dr. Percival Spanish. Looks like this was already done. Will route note to Cherre Robins with family medicine as well as pharmacists in Dr. Rosezella [place] office to confirm that the clearance form was faxed back to Dr. Rosana Hoes with [facility] since we did not receive clearance form here.

## 2015-12-10 NOTE — Telephone Encounter (Signed)
Called patient with instructions for holding warfarin prior to up coming bladder cancer tumor removal.  No date has been set for surgery.  After consulting with Dr Jenkins Rouge the recommendation is to hold warfarin 5 days prior to surgery with no lovenox bridging.  This is communicated to patient and patient voices understanding.   [name] also changed appt for protime for tomorrow because he cannot make it to appt. New appt for next week made.  He also asks about repeat x-ray that was recommended by Dr Wendi Snipes and wanted to make appt with Dr Wendi Snipes.  I was unsure if patient needs just xray appt or appt with provider.  Will forward to Dr Alen Bleacher nurse to make appt if needed.

## 2015-12-10 NOTE — Telephone Encounter (Signed)
Call regarding holding Coumadin for surgery.  Received notice from office to hold for 10 days.  Per Dr. Rosana Hoes @ [facility] he only need to hold for 5 day and restart med next day after surgery.

## 2015-12-10 NOTE — Telephone Encounter (Signed)
Appointment made 8/15 with Dr.Bradshaw.

## 2015-12-10 NOTE — Progress Notes (Signed)
Cardiology Office Note   Date:  12/11/2015   ID:  [name], DOB [birth year], MRN [id number]  PCP:  Eustaquio Maize, MD  Cardiologist:   Minus Breeding, MD   No chief complaint on file.     History of Present Illness: [name] is a 75 y.o. male who presents for evaluation of known coronary disease. He was previously seen by Dr. Mare Ferrari.  He has a history of CABG many years ago and a redo in 2007.  He has atrial fibrillation and cardiomyopathy. He has had anxiety. He has an ICD. He was seen last fall and subsequently rehospitalized and had a cardiac cath. He was found to have occlusion of the proximal RCA circumflex and LAD. He had a widely patent vein graft sequential to RCA and right coronary distal SVG to ramus intermediate and patent LIMA to the LAD. This was not changed since previous and he was managed medically. He had a hernia surgery. He subsequently developed some urinary retention and had a Foley catheter. After this came out he presented to the ED on 11/16/15 with the inability to void. CT of the abdomen and pelvis revealed a 4 cm bladder mass consistent with urothelial carcinoma and asymmetric enhancement and fullness of the right prostate concerning for carcinoma. He was admitted for further evaluation and management.  The patient has a Foley catheter again and he is going to see Dr. Rosana Hoes to have a transurethral procedure to manage his bladder cancer.  He has been given instructions to hold his warfarin  From a cardiac standpoint he has done OK but is fatigued.. He has had limited activities because of all of his medical problems this year.  However, he is upbeat and he actually denies any cardiovascular symptoms. The patient denies any new symptoms such as chest discomfort, neck or arm discomfort. There has been no new shortness of breath, PND or orthopnea. There have been no reported palpitations, presyncope or syncope.   Past Medical History:  Diagnosis Date  .  A-fib (HCC)    a. chronic coumadin  . Anxiety   . Atrial flutter ([place])    a. 01/2011 s/p unsuccessful RFCA complicated by CHB req PPM.  . Bladder mass 11/17/2015   Urothelial carcinoma until proven otherwise.  [name] Kitchen BPH (benign prostatic hyperplasia)    a. elevated PSA  . CAD (coronary artery disease)    a. 1991 s/p CABGx4;  b. redo CABG in 2007 (SVG->OM, VG->PDA->PLV);  c. 01/2011 Cath: LM 80ost, LAD 100p, LCX 100ost, OM1 100, RCA 100, LIMA->LAD ok, VG->PDA ok, VG->OM ok;  d. 03/2012 MV: EF 32% inf & inflat scar w/o ischemia; e. Lexi MV: EF 33%, inf infarct, no ischemia. f. lexiscan 06/12/2014 RCA scar, no ischemia, EF 26%  . CHB (complete heart block) ([place])    a. 01/2011: in setting of RFCA, s/p SJM 2110 Acc[facility] DC PPM, ser # [id number].  [name] Kitchen Chronic systolic CHF (congestive heart failure), NYHA class 2 ([place])    a. 06/2012 Echo: EF 25%, distal sept and apical AK, sev dil LV, mild LVH, mod dil LA.  . Depression    "since [id number]" (08/29/2012)  . Elevated PSA, less than 10 ng/ml   . Fibromyalgia   . GERD (gastroesophageal reflux disease)   . Hiatal hernia    a. 03/01/2013 EGD and esoph dil.  [name] Kitchen Hyperlipidemia   . Hypertension   . Hyponatremia 11/17/2015   Water intoxication versus SIADH.  . Ischemic cardiomyopathy  a. 06/2012 EF 25% by echo.  . Kidney cysts    a. bilateral  . Pacemaker    explanted 06/15/15 >>> CRT-D STJ device, Dr. Caryl Comes  . Panic attacks   . Skin cancer    'above left eyebrow; tip of my nose" (08/29/2012)    Past Surgical History:  Procedure Laterality Date  . ATRIAL FLUTTER ABLATION  01/2011   "didn't work" (08/29/2012)  . CARDIAC CATHETERIZATION  10/30/2009   Grafts patent. Mild to moderate LV dysfunction. EF 45%. Managed medically.   [name] Kitchen CARDIAC CATHETERIZATION     multiple  . CARDIAC CATHETERIZATION N/A 04/28/2015   Procedure: Left Heart Cath and Cors/Grafts Angiography;  Surgeon: Belva Crome, MD;  Location: [facility];  Service: Cardiovascular;  Laterality:  N/A;  . CARDIOVERSION  05/25/2012   Procedure: CARDIOVERSION;  Surgeon: Darlin Coco, MD;  Location: [facility];  Service: Cardiovascular;  Laterality: N/A;  . CATARACT EXTRACTION W/ INTRAOCULAR LENS  IMPLANT, BILATERAL  2012  . CHOLECYSTECTOMY  2007  . CORONARY ARTERY BYPASS GRAFT  08/27/1989   CABG X 5  . CORONARY ARTERY BYPASS GRAFT  06/2005   CABG X 3  . EP IMPLANTABLE DEVICE N/A 06/15/2015   STJ, CRT-D, Dr. Caryl Comes  . EXCISIONAL HEMORRHOIDECTOMY  ~ 1980  . [place]   "growth on the bone taken off; not cancer" (08/29/2012)  . INGUINAL HERNIA REPAIR  1960's   "left I think"  . INGUINAL HERNIA REPAIR Right 10/12/2015   Procedure: RIGHT INGUINAL HERNIA REPAIR WITH MESH;  Surgeon: Jackolyn Confer, MD;  Location: [facility];  Service: General;  Laterality: Right;  . INSERT / REPLACE / REMOVE PACEMAKER  01/2011   initial placement  . INSERTION OF MESH Right 10/12/2015   Procedure: INSERTION OF MESH, RIGHT INGUINAL HERNIA;  Surgeon: Jackolyn Confer, MD;  Location: [facility];  Service: General;  Laterality: Right;  . SKIN CANCER EXCISION     'above left eyebrow; tip of my nose" (08/29/2012)  . TONSILLECTOMY  1960     Current Outpatient Prescriptions  Medication Sig Dispense Refill  . acetaminophen (TYLENOL) 500 MG tablet Take 500 mg by mouth every 8 (eight) hours as needed for mild pain.     [name] Kitchen ALPRAZolam (XANAX) 1 MG tablet Take 1 tablet (1 mg total) by mouth 3 (three) times daily as needed. For anxiety 270 tablet 1  . carvedilol (COREG) 6.25 MG tablet Take 1 tablet (6.25 mg total) by mouth 2 (two) times daily. 180 tablet 2  . doxepin (SINEQUAN) 25 MG capsule Take 25 mg (1 capsule) by mouth in the morning and 50 mg (2 capsules) by mouth at night. (Patient taking differently: Take 25-50 mg by mouth 2 (two) times daily. Take 25 mg (1 capsule) by mouth in the morning and 50 mg (2 capsules) by mouth at night.) 270 capsule 3  . fluticasone (FLONASE) 50 MCG/ACT nasal spray Place 2 sprays  into [name] nostrils daily. 16 g 6  . furosemide (LASIX) 20 MG tablet Take 1 tablet (20 mg total) by mouth daily. 30 tablet 1  . gabapentin (NEURONTIN) 100 MG capsule Take 100 mg by mouth 4 (four) times daily.    [name] Kitchen guaiFENesin (MUCINEX) 600 MG 12 hr tablet Take 600 mg by mouth 2 (two) times daily. Reported on 11/12/2015    . hydroxypropyl methylcellulose / hypromellose (ISOPTO TEARS / GONIOVISC) 2.5 % ophthalmic solution Place 1 drop into [name] eyes 3 (three) times daily as needed for dry eyes.    [name] Kitchen  isosorbide mononitrate (IMDUR) 60 MG 24 hr tablet Take 1 tablet (60 mg total) by mouth daily. 90 tablet 2  . lisinopril (PRINIVIL,ZESTRIL) 5 MG tablet Take 1 tablet (5 mg total) by mouth every other day. 90 tablet 3  . loratadine (CLARITIN) 10 MG tablet Take 10 mg by mouth daily.    . Multiple Vitamins-Minerals (PRESERVISION AREDS 2) CAPS Take 2 capsules by mouth daily. Reported on 06/26/2015    . nitroGLYCERIN (NITROSTAT) 0.4 MG SL tablet DISSOLVE ONE TABLET UNDER THE TONGUE EVERY 5 MINUTES AS NEEDED FOR CHEST PAIN.  DO NOT EXCEED A TOTAL OF 3 DOSES IN 15 MINUTES 100 tablet 3  . omeprazole (PRILOSEC) 20 MG capsule Take 1 capsule (20 mg total) by mouth 2 (two) times daily. 180 capsule 3  . polyethylene glycol (MIRALAX / GLYCOLAX) packet Take 17 g by mouth daily.     . rosuvastatin (CRESTOR) 10 MG tablet Take 1 tablet (10 mg total) by mouth daily. 90 tablet 3  . warfarin (JANTOVEN) 5 MG tablet Take 5 mg by mouth every evening.     No current facility-administered medications for this visit.     Allergies:   Hydrocodone-acetaminophen; Prednisone; Sulfa drugs cross reactors; Ultram [tramadol hcl]; Xarelto [rivaroxaban]; and Zithromax [azithromycin]    ROS:  Please see the history of present illness.   Otherwise, review of systems are positive for stopped up nose.   All other systems are reviewed and negative.    PHYSICAL EXAM: VS:  BP 100/64   Pulse 68   Ht 5\' 11"  (1.803 m)   Wt 182 lb (82.6 kg)    SpO2 99%   BMI 25.38 kg/m  , BMI Body mass index is 25.38 kg/m. GENERAL:  Well appearing NECK:  Mild jugular venous distention, waveform within normal limits, carotid upstroke brisk and symmetric, no bruits, no thyromegaly LYMPHATICS:  No cervical, inguinal adenopathy LUNGS:  Clear to auscultation bilaterally BACK:  No CVA tenderness CHEST:  Well healed sternotomy scar, the ICD pocket has a small area that is still healing but there is no drainage or erythema in this medial aspect of the wound HEART:  PMI not displaced or sustained,S1 and S2 within normal limits, no S3, no S4, no clicks, no rubs, no murmurs ABD:  Flat, positive bowel sounds normal in frequency in pitch, no bruits, no rebound, no guarding, no midline pulsatile mass, no hepatomegaly, no splenomegaly EXT:  2 plus pulses upper, mildly reduced dorsalis pedis and posterior tibialis bilaterally lower, no edema, no cyanosis no clubbing   EKG:  EKG is not  ordered today.  Lab Results  Component Value Date   HGBA1C 6.1 (H) 08/05/2015    Recent Labs: 11/16/2015: ALT 17 11/18/2015: TSH 1.702 11/20/2015: BUN 18; Creatinine, Ser 0.73; Hemoglobin 11.8; Platelets 156; Potassium 4.1; Sodium 133    Lipid Panel    Component Value Date/Time   CHOL 111 08/05/2015 0000   TRIG 61 08/05/2015 0000   HDL 41 08/05/2015 0000   CHOLHDL 2.7 08/05/2015 0000   CHOLHDL 3.1 03/25/2015 0753   VLDL 16 03/25/2015 0753   LDLCALC 58 08/05/2015 0000   LDLDIRECT 77.3 12/20/2010 0922   Lab Results  Component Value Date   HGBA1C 6.1 (H) 08/05/2015    Wt Readings from Last 3 Encounters:  12/11/15 182 lb (82.6 kg)  12/07/15 183 lb (83 kg)  11/17/15 188 lb 1.6 oz (85.3 kg)      Other studies Reviewed: Additional studies/ records that were reviewed today include:  Hospital records including his recent admission.   Review of the above records demonstrates:  Please see elsewhere in the note.     ASSESSMENT AND PLAN:  Ischemic heart disease  status post CABG remotely and status post redo CABG in 2007:  Coronary anatomy from late 2016 is as above.  He has no symptoms. No change in therapy is indicated.  Hyperlipidemia:  This is recorded as above. He did have his meds switched to Crestor and his lipid profile is good as above.    Hypertensive heart disease without heart failure: His blood pressures well controlled. He will continue the meds as listed.  Permanent atrial fibrillation with functioning ventricular pacemaker, following ablation for atrial flutter resulting in complete heart block. On Coumadin.  He has not tolerated Xarelto in the past.  Mr. AYDYN LIZZA has a CHA2DS2 - VASc score of 4 with a risk of stroke of 4%.  Of note he can come off of his warfarin for planned procedure including bladder surgery. Bridging anticoagulation is not required.  Ischemic cardiomyopathy:  . Echo shows aortic valve sclerosis and mild mitral regurgitation. I will follow this clinically.   Most recent ejection fraction by echo on 04/2015  is 30%, improved from 25%. No change in therapy is indicated.  Blood pressure will not allow med titration.     Current medicines are reviewed at length with the patient today.  The patient does not have concerns regarding medicines.  The following changes have been made:  no change  Labs/ tests ordered today include:   No orders of the defined types were placed in this encounter.    Disposition:   FU with me in 6 months.     Signed, Minus Breeding, MD  12/11/2015 2:33 PM    [facility]

## 2015-12-10 NOTE — Telephone Encounter (Signed)
can you check to see if this was done.  Looks like he was given clearance for hernia surgery back in April.

## 2015-12-11 ENCOUNTER — Ambulatory Visit (INDEPENDENT_AMBULATORY_CARE_PROVIDER_SITE_OTHER): Payer: Medicare Other | Admitting: Pharmacist Clinician (PhC)/ Clinical Pharmacy Specialist

## 2015-12-11 ENCOUNTER — Ambulatory Visit (INDEPENDENT_AMBULATORY_CARE_PROVIDER_SITE_OTHER): Payer: Medicare Other | Admitting: Cardiology

## 2015-12-11 ENCOUNTER — Encounter: Payer: Self-pay | Admitting: Cardiology

## 2015-12-11 ENCOUNTER — Encounter: Payer: Self-pay | Admitting: Pharmacist Clinician (PhC)/ Clinical Pharmacy Specialist

## 2015-12-11 VITALS — BP 100/64 | HR 68 | Ht 71.0 in | Wt 182.0 lb

## 2015-12-11 DIAGNOSIS — I255 Ischemic cardiomyopathy: Secondary | ICD-10-CM | POA: Diagnosis not present

## 2015-12-11 DIAGNOSIS — I4891 Unspecified atrial fibrillation: Secondary | ICD-10-CM

## 2015-12-11 DIAGNOSIS — Z01810 Encounter for preprocedural cardiovascular examination: Secondary | ICD-10-CM

## 2015-12-11 DIAGNOSIS — I481 Persistent atrial fibrillation: Secondary | ICD-10-CM

## 2015-12-11 DIAGNOSIS — I482 Chronic atrial fibrillation, unspecified: Secondary | ICD-10-CM

## 2015-12-11 DIAGNOSIS — I4819 Other persistent atrial fibrillation: Secondary | ICD-10-CM

## 2015-12-11 LAB — COAGUCHEK XS/INR WAIVED
INR: 2.6 — AB (ref 0.9–1.1)
PROTHROMBIN TIME: 30.9 s

## 2015-12-11 NOTE — Progress Notes (Signed)
Patient ID: [name], male   DOB: March 02, 1941, 75 y.o.   MRN: [id number]   Patient was seen for coag visit today see anticoagulation note

## 2015-12-11 NOTE — Patient Instructions (Signed)
Anticoagulation Dose Instructions as of 12/11/2015      [name] Tue Wed Thu Fri Sat   New Dose 5 mg 5 mg 5 mg 5 mg 5 mg 5 mg 2.5 mg    Description   Continue usual dose of warfarin 5mg  - 1/2 tablet on Saturdays and 1 tablet all other days.  INR today 2.6

## 2015-12-11 NOTE — Patient Instructions (Addendum)

## 2015-12-12 ENCOUNTER — Encounter: Payer: Self-pay | Admitting: Cardiology

## 2015-12-15 ENCOUNTER — Ambulatory Visit (INDEPENDENT_AMBULATORY_CARE_PROVIDER_SITE_OTHER): Payer: Medicare Other | Admitting: *Deleted

## 2015-12-15 ENCOUNTER — Encounter: Payer: Self-pay | Admitting: Family Medicine

## 2015-12-15 ENCOUNTER — Encounter: Payer: Self-pay | Admitting: Pharmacist

## 2015-12-15 ENCOUNTER — Other Ambulatory Visit: Payer: Self-pay | Admitting: Family Medicine

## 2015-12-15 ENCOUNTER — Ambulatory Visit (INDEPENDENT_AMBULATORY_CARE_PROVIDER_SITE_OTHER): Payer: Medicare Other | Admitting: Family Medicine

## 2015-12-15 ENCOUNTER — Ambulatory Visit: Payer: Medicare Other | Admitting: Family

## 2015-12-15 ENCOUNTER — Telehealth: Payer: Self-pay | Admitting: Cardiology

## 2015-12-15 ENCOUNTER — Ambulatory Visit (INDEPENDENT_AMBULATORY_CARE_PROVIDER_SITE_OTHER): Payer: Medicare Other

## 2015-12-15 VITALS — BP 119/74 | HR 72 | Temp 98.1°F | Ht 71.0 in | Wt 183.0 lb

## 2015-12-15 DIAGNOSIS — T189XXA Foreign body of alimentary tract, part unspecified, initial encounter: Secondary | ICD-10-CM | POA: Diagnosis not present

## 2015-12-15 DIAGNOSIS — Z95810 Presence of automatic (implantable) cardiac defibrillator: Secondary | ICD-10-CM

## 2015-12-15 DIAGNOSIS — I255 Ischemic cardiomyopathy: Secondary | ICD-10-CM

## 2015-12-15 DIAGNOSIS — M795 Residual foreign body in soft tissue: Secondary | ICD-10-CM

## 2015-12-15 DIAGNOSIS — I5022 Chronic systolic (congestive) heart failure: Secondary | ICD-10-CM

## 2015-12-15 NOTE — Telephone Encounter (Signed)
Spoke with pt and reminded pt of remote transmission that is due today. Pt verbalized understanding.   

## 2015-12-15 NOTE — Progress Notes (Signed)
   HPI  Patient presents today after incidental foreign body finding.  Patient denies any abdominal pain, he has had diarrhea lately. He denies any history or concern for ingested foreign body. He has had a recent bladder cancer and prostate cancer diagnosis, however he has not had any instrumentation except for a Foley catheter.  He feels well. He is eating and drinking normally. He has bladder surgery scheduled for 2 weeks from now.   PMH: Smoking status noted ROS: Per HPI  Objective: There were no vitals taken for this visit. Gen: NAD, alert, cooperative with exam HEENT: NCAT CV: RRR, good S1/S2, no murmur Resp: CTABL, no wheezes, non-labored Abd: SNTND, BS present, no guarding or organomegaly Neuro: Alert and oriented, No gross deficits  Plain film today shows foreign body that has moved leftward, likely along the path of the colon.  Assessment and plan:  # Foreign body of the alimentary tract Given shape of the foreign body I have recommended GI follow-up as soon his can be arranged, nursing has arrange this for tomorrow. I recommended watchful waiting and examining the stool for the foreign body. I've also given him a low threshold for return here or returning to the emergency room if he should develop fever chills, or severe abdominal pain. The foreign body initially appeared to be a surgical clip, today however it's longer and appears to have sharper ends than previously seen. If it is in the sigmoid colon and may be able to be reached by flexible sigmoidoscopy, appreciate GIs management and evaluation.    Laroy Apple, MD [facility] 12/15/2015, 2:21 PM

## 2015-12-15 NOTE — Patient Instructions (Addendum)
Great to see you!  We will help you get an appointment with your GI doctor to discuss this.

## 2015-12-16 ENCOUNTER — Telehealth: Payer: Self-pay | Admitting: Family Medicine

## 2015-12-16 ENCOUNTER — Encounter: Payer: Self-pay | Admitting: Cardiology

## 2015-12-16 NOTE — Progress Notes (Signed)
Remote ICD transmission.   

## 2015-12-17 ENCOUNTER — Encounter: Payer: Self-pay | Admitting: Pharmacist

## 2015-12-18 LAB — CUP PACEART REMOTE DEVICE CHECK
Date Time Interrogation Session: 20170815071525
HIGH POWER IMPEDANCE MEASURED VALUE: 70 Ohm
HighPow Impedance: 70 Ohm
Implantable Lead Implant Date: 20120927
Implantable Lead Implant Date: 20170213
Implantable Lead Location: 753858
Implantable Lead Location: 753859
Implantable Lead Location: 753860
Implantable Lead Model: 7122
Lead Channel Impedance Value: 550 Ohm
Lead Channel Pacing Threshold Amplitude: 0.75 V
Lead Channel Pacing Threshold Pulse Width: 0.8 ms
Lead Channel Setting Pacing Pulse Width: 0.8 ms
Lead Channel Setting Sensing Sensitivity: 2 mV
MDC IDC LEAD IMPLANT DT: 20170213
MDC IDC MSMT BATTERY REMAINING LONGEVITY: 74 mo
MDC IDC MSMT BATTERY REMAINING PERCENTAGE: 90 %
MDC IDC MSMT BATTERY VOLTAGE: 3.1 V
MDC IDC MSMT LEADCHNL LV PACING THRESHOLD AMPLITUDE: 1.25 V
MDC IDC MSMT LEADCHNL RV IMPEDANCE VALUE: 540 Ohm
MDC IDC MSMT LEADCHNL RV PACING THRESHOLD PULSEWIDTH: 0.5 ms
MDC IDC MSMT LEADCHNL RV SENSING INTR AMPL: 12 mV
MDC IDC SET LEADCHNL LV PACING AMPLITUDE: 2.25 V
MDC IDC SET LEADCHNL RV PACING AMPLITUDE: 2.5 V
MDC IDC SET LEADCHNL RV PACING PULSEWIDTH: 0.5 ms
Pulse Gen Serial Number: 7335682

## 2015-12-21 ENCOUNTER — Ambulatory Visit (INDEPENDENT_AMBULATORY_CARE_PROVIDER_SITE_OTHER): Payer: Medicare Other

## 2015-12-21 ENCOUNTER — Encounter: Payer: Self-pay | Admitting: Pediatrics

## 2015-12-21 ENCOUNTER — Ambulatory Visit (INDEPENDENT_AMBULATORY_CARE_PROVIDER_SITE_OTHER): Payer: Medicare Other | Admitting: Pediatrics

## 2015-12-21 VITALS — BP 105/65 | HR 75 | Temp 98.0°F | Ht 71.0 in | Wt 183.6 lb

## 2015-12-21 DIAGNOSIS — M795 Residual foreign body in soft tissue: Secondary | ICD-10-CM

## 2015-12-21 DIAGNOSIS — I5022 Chronic systolic (congestive) heart failure: Secondary | ICD-10-CM | POA: Diagnosis not present

## 2015-12-21 DIAGNOSIS — K5900 Constipation, unspecified: Secondary | ICD-10-CM

## 2015-12-21 DIAGNOSIS — I4891 Unspecified atrial fibrillation: Secondary | ICD-10-CM

## 2015-12-21 DIAGNOSIS — I25119 Atherosclerotic heart disease of native coronary artery with unspecified angina pectoris: Secondary | ICD-10-CM | POA: Diagnosis not present

## 2015-12-21 DIAGNOSIS — F419 Anxiety disorder, unspecified: Secondary | ICD-10-CM | POA: Diagnosis not present

## 2015-12-21 MED ORDER — ALPRAZOLAM 1 MG PO TABS: 1.0000 mg | ORAL_TABLET | Freq: Three times a day (TID) | ORAL | 1 refills | Status: DC | PRN

## 2015-12-21 NOTE — Patient Instructions (Signed)
Come back for INR recheck 2 weeks after procedure

## 2015-12-21 NOTE — Progress Notes (Signed)
  Subjective:   Patient ID: [name], male    DOB: Apr 27, 1941, 75 y.o.   MRN: [id number] CC: Followup metal in colon  HPI: [name] is a 75 y.o. male presenting for Followup metal in colon  Was not sure how it got there Was seen by GI Told to follow up with [place] for repeat xray Does not remember seeing it pass in stool  Has upcoming surgery to remove bladder mass Will not be bridging with lovenox shots Will follow up here for INR check  No chest pain or pressure, no palpitations  Increasing miralax at home to help with constipation Going every 2 days now, goal is daily  Continues to feel anxious at times Takes xanax 3 times a day, helps with anxiety Has been tried on many other medicines in past, only the xanax has helped  Relevant past medical, surgical, family and social history reviewed. Allergies and medications reviewed and updated.  History  Smoking Status  . Former Smoker  . Packs/day: 0.00  . Years: 0.00  . Types: Cigarettes  . Quit date: 09/21/1973  Smokeless Tobacco  . Never Used   ROS: Per HPI   Objective:    BP 105/65 (BP Location: Left Arm, Patient Position: Sitting, Cuff Size: Normal)   Pulse 75   Temp 98 F (36.7 C) (Oral)   Ht 5\' 11"  (1.803 m)   Wt 183 lb 9.6 oz (83.3 kg)   BMI 25.61 kg/m   Wt Readings from Last 3 Encounters:  12/21/15 183 lb 9.6 oz (83.3 kg)  12/15/15 183 lb (83 kg)  12/11/15 182 lb (82.6 kg)    Gen: NAD, alert, cooperative with exam, NCAT EYES: EOMI, no conjunctival injection, or no icterus ENT:  TMs pearly gray b/l, OP without erythema LYMPH: no cervical LAD CV: NRRR, normal [id number], systolic ejection murmur LSB, distal pulses 2+ b/l Resp: CTABL, no wheezes, normal WOB Abd: +BS, soft, NTND.  Ext: No edema, warm Neuro: Alert and oriented, strength equal b/l UE and LE, coordination grossly normal MSK: normal muscle bulk  Assessment & Plan:  [name] was seen today for followup metal in colon.  Diagnoses and  all orders for this visit:  Residual foreign body in soft tissue Gone from xray -     DG Abd 1 View; Future  Anxiety Will call in Rx to pharmacy -     ALPRAZolam Duanne Moron) 1 MG tablet; Take 1 tablet (1 mg total) by mouth 3 (three) times daily as needed. For anxiety  Constipation, unspecified constipation type Continue to increase miralax at home  Coronary artery disease involving native coronary artery of native heart with angina pectoris ([place]) Asymptomatic, follows with caridology  Atrial fibrillation, unspecified type ([place]) No palpitations On coumadin  Chronic systolic congestive heart failure ([place]) No SOB  Follow up plan: Return in about 3 months (around 03/22/2016). Assunta Found, MD [place]

## 2015-12-23 ENCOUNTER — Ambulatory Visit (INDEPENDENT_AMBULATORY_CARE_PROVIDER_SITE_OTHER): Payer: Medicare Other

## 2015-12-23 ENCOUNTER — Encounter: Payer: Self-pay | Admitting: Family Medicine

## 2015-12-23 ENCOUNTER — Ambulatory Visit (INDEPENDENT_AMBULATORY_CARE_PROVIDER_SITE_OTHER): Payer: Medicare Other | Admitting: Family Medicine

## 2015-12-23 VITALS — BP 123/69 | HR 76 | Temp 97.3°F | Ht 71.0 in | Wt 184.6 lb

## 2015-12-23 DIAGNOSIS — M542 Cervicalgia: Secondary | ICD-10-CM

## 2015-12-23 DIAGNOSIS — M545 Low back pain, unspecified: Secondary | ICD-10-CM

## 2015-12-23 NOTE — Progress Notes (Signed)
BP 123/69 (BP Location: Left Arm, Patient Position: Sitting, Cuff Size: Normal)   Pulse 76   Temp 97.3 F (36.3 C) (Oral)   Ht 5\' 11"  (1.803 m)   Wt 184 lb 9.6 oz (83.7 kg)   BMI 25.75 kg/m    Subjective:    Patient ID: [name], male    DOB: [birth year], 75 y.o.   MRN: [id number]  HPI: [name] is a 75 y.o. male presenting on 12/23/2015 for Back Pain (hurting from neck down)   HPI Back and neck pain Patient comes in today because he has been having a day and a half of back and neck pain that's worse than what he normally has. He was in his wife's hospital bed and was pushing the button up and felt like it pinched him forward to the point where he he heard something pop in his Acker neck. Since then is been having bilateral back and neck pain going all the way from the paraspinal musculature in the neck all the way down to the lower back. He is mainly concerned that there is something shifted or damaged in there. He was also concerned about whether or not he would still be able to have his surgery tomorrow for his cancer. He denies any tingling or numbness going down into his legs or his feet. Patient has tried one of oxycodone that he has previously but always constipates him to use it sparingly. He tried some muscle relaxer that he had previously but it does not help. He cannot take anti-inflammatories because of currently being on Coumadin.  Relevant past medical, surgical, family and social history reviewed and updated as indicated. Interim medical history since our last visit reviewed. Allergies and medications reviewed and updated.  Review of Systems  Constitutional: Negative for fever.  HENT: Negative for ear discharge and ear pain.   Eyes: Negative for discharge and visual disturbance.  Respiratory: Negative for shortness of breath and wheezing.   Cardiovascular: Negative for chest pain and leg swelling.  Gastrointestinal: Negative for abdominal pain,  constipation and diarrhea.  Genitourinary: Negative for difficulty urinating.  Musculoskeletal: Positive for arthralgias, back pain and neck pain. Negative for gait problem.  Skin: Negative for rash.  Neurological: Negative for syncope, light-headedness and headaches.  All other systems reviewed and are negative.   Per HPI unless specifically indicated above     Medication List       Accurate as of 12/23/15 10:23 AM. Always use your most recent med list.          acetaminophen 500 MG tablet Commonly known as:  TYLENOL Take 500 mg by mouth every 8 (eight) hours as needed for mild pain.   ALPRAZolam 1 MG tablet Commonly known as:  XANAX Take 1 tablet (1 mg total) by mouth 3 (three) times daily as needed. For anxiety   carvedilol 6.25 MG tablet Commonly known as:  COREG Take 1 tablet (6.25 mg total) by mouth 2 (two) times daily.   doxepin 25 MG capsule Commonly known as:  SINEQUAN Take 25 mg (1 capsule) by mouth in the morning and 50 mg (2 capsules) by mouth at night.   fluticasone 50 MCG/ACT nasal spray Commonly known as:  FLONASE Place 2 sprays into both nostrils daily.   furosemide 20 MG tablet Commonly known as:  LASIX Take 1 tablet (20 mg total) by mouth daily.   gabapentin 100 MG capsule Commonly known as:  NEURONTIN Take 100 mg by mouth  4 (four) times daily.   guaiFENesin 600 MG 12 hr tablet Commonly known as:  MUCINEX Take 600 mg by mouth 2 (two) times daily. Reported on 11/12/2015   hydroxypropyl methylcellulose / hypromellose 2.5 % ophthalmic solution Commonly known as:  ISOPTO TEARS / GONIOVISC Place 1 drop into both eyes 3 (three) times daily as needed for dry eyes.   isosorbide mononitrate 60 MG 24 hr tablet Commonly known as:  IMDUR Take 1 tablet (60 mg total) by mouth daily.   JANTOVEN 5 MG tablet Generic drug:  warfarin Take 5 mg by mouth every evening.   lisinopril 5 MG tablet Commonly known as:  PRINIVIL,ZESTRIL Take 1 tablet (5 mg total)  by mouth every other day.   loratadine 10 MG tablet Commonly known as:  CLARITIN Take 10 mg by mouth daily.   nitroGLYCERIN 0.4 MG SL tablet Commonly known as:  NITROSTAT DISSOLVE ONE TABLET UNDER THE TONGUE EVERY 5 MINUTES AS NEEDED FOR CHEST PAIN.  DO NOT EXCEED A TOTAL OF 3 DOSES IN 15 MINUTES   omeprazole 20 MG capsule Commonly known as:  PRILOSEC Take 1 capsule (20 mg total) by mouth 2 (two) times daily.   polyethylene glycol packet Commonly known as:  MIRALAX / GLYCOLAX Take 17 g by mouth daily.   PRESERVISION AREDS 2 Caps Take 2 capsules by mouth daily. Reported on 06/26/2015   rosuvastatin 10 MG tablet Commonly known as:  CRESTOR Take 1 tablet (10 mg total) by mouth daily.          Objective:    BP 123/69 (BP Location: Left Arm, Patient Position: Sitting, Cuff Size: Normal)   Pulse 76   Temp 97.3 F (36.3 C) (Oral)   Ht 5\' 11"  (1.803 m)   Wt 184 lb 9.6 oz (83.7 kg)   BMI 25.75 kg/m   Wt Readings from Last 3 Encounters:  12/23/15 184 lb 9.6 oz (83.7 kg)  12/21/15 183 lb 9.6 oz (83.3 kg)  12/15/15 183 lb (83 kg)    Physical Exam  Constitutional: He is oriented to person, place, and time. He appears well-developed and well-nourished. No distress.  Eyes: Conjunctivae and EOM are normal. Pupils are equal, round, and reactive to light. Right eye exhibits no discharge. No scleral icterus.  Neck: Neck supple. No thyromegaly present.  Cardiovascular: Normal rate, regular rhythm, normal heart sounds and intact distal pulses.   No murmur heard. Pulmonary/Chest: Effort normal and breath sounds normal. No respiratory distress. He has no wheezes.  Musculoskeletal: Normal range of motion. He exhibits no edema.       Cervical back: He exhibits tenderness (Bilateral paraspinal tenderness). He exhibits normal range of motion, no bony tenderness, no swelling, no edema, no deformity and normal pulse.       Thoracic back: He exhibits tenderness (Bilateral paraspinal  tenderness). He exhibits normal range of motion, no bony tenderness, no swelling, no edema and no deformity.       Lumbar back: He exhibits tenderness (Bilateral paraspinal tenderness, negative straight leg raise). He exhibits normal range of motion, no bony tenderness, no swelling, no edema, no deformity and normal pulse.  Lymphadenopathy:    He has no cervical adenopathy.  Neurological: He is alert and oriented to person, place, and time. Coordination normal.  Skin: Skin is warm and dry. No rash noted. He is not diaphoretic.  Psychiatric: He has a normal mood and affect. His behavior is normal.  Nursing note and vitals reviewed.  Lumbar, thoracic, cervical spinal x-rays:  Await final read by radiologist    Assessment & Plan:   Problem List Items Addressed This Visit      Other   Neck pain - Primary   Relevant Orders   DG Cervical Spine Complete    Other Visit Diagnoses    Bilateral low back pain without sciatica       Relevant Orders   DG Lumbar Spine 2-3 Views   DG Cervical Spine Complete   DG Thoracic Spine 2 View       Follow up plan: Return if symptoms worsen or fail to improve.  Counseling provided for all of the vaccine components Orders Placed This Encounter  Procedures  . DG Lumbar Spine 2-3 Views  . DG Cervical Spine Complete  . DG Thoracic Spine Whitney Dettinger, MD [facility] 12/23/2015, 10:23 AM

## 2015-12-25 ENCOUNTER — Other Ambulatory Visit: Payer: Self-pay | Admitting: Pediatrics

## 2015-12-25 NOTE — Telephone Encounter (Signed)
Pt aware that is at [facility] in [place]

## 2016-01-01 ENCOUNTER — Telehealth: Payer: Self-pay | Admitting: Pharmacist Clinician (PhC)/ Clinical Pharmacy Specialist

## 2016-01-01 DIAGNOSIS — C671 Malignant neoplasm of dome of bladder: Secondary | ICD-10-CM | POA: Insufficient documentation

## 2016-01-01 NOTE — Telephone Encounter (Signed)
I called patient back and left a message since he was not at home.  I am unsure if he had surgery and if so when it was or if surgery is planned for a future date.  I asked Mr. [name] to call be back with more specifics so I can give him detailed directions on how to take his warfarin.

## 2016-01-05 NOTE — Telephone Encounter (Signed)
Patient called.  He restarted warfarin 5mg  and took 1 tablet daily for the last 4 days.   Recommended restart his usual dose of 5mg  qd and 2.5mg  on Saturdays.  Recheck INR this week - appt made at patient request for Thursday afternoon after appt with urologist.

## 2016-01-06 ENCOUNTER — Ambulatory Visit: Payer: Medicare Other | Admitting: Cardiology

## 2016-01-07 ENCOUNTER — Ambulatory Visit (INDEPENDENT_AMBULATORY_CARE_PROVIDER_SITE_OTHER): Payer: Medicare Other | Admitting: Pharmacist

## 2016-01-07 DIAGNOSIS — I4891 Unspecified atrial fibrillation: Secondary | ICD-10-CM

## 2016-01-07 LAB — COAGUCHEK XS/INR WAIVED
INR: 1.6 — AB (ref 0.9–1.1)
PROTHROMBIN TIME: 18.7 s

## 2016-01-15 ENCOUNTER — Ambulatory Visit (INDEPENDENT_AMBULATORY_CARE_PROVIDER_SITE_OTHER): Payer: Medicare Other | Admitting: Pharmacist Clinician (PhC)/ Clinical Pharmacy Specialist

## 2016-01-15 DIAGNOSIS — I482 Chronic atrial fibrillation, unspecified: Secondary | ICD-10-CM

## 2016-01-15 DIAGNOSIS — I4891 Unspecified atrial fibrillation: Secondary | ICD-10-CM

## 2016-01-15 LAB — COAGUCHEK XS/INR WAIVED
INR: 2.5 — AB (ref 0.9–1.1)
PROTHROMBIN TIME: 30.5 s

## 2016-01-15 NOTE — Patient Instructions (Signed)
Anticoagulation Dose Instructions as of 01/15/2016      [name] Tue Wed Thu Fri Sat   New Dose 5 mg 5 mg 5 mg 5 mg 5 mg 5 mg 2.5 mg    Description   Continue taking warfarin as listed above.  INR today is 2.5

## 2016-01-22 ENCOUNTER — Encounter: Payer: Self-pay | Admitting: Pharmacist Clinician (PhC)/ Clinical Pharmacy Specialist

## 2016-01-22 ENCOUNTER — Ambulatory Visit: Payer: Medicare Other

## 2016-01-28 ENCOUNTER — Ambulatory Visit (INDEPENDENT_AMBULATORY_CARE_PROVIDER_SITE_OTHER): Payer: Medicare Other

## 2016-01-28 DIAGNOSIS — Z23 Encounter for immunization: Secondary | ICD-10-CM | POA: Diagnosis not present

## 2016-02-03 ENCOUNTER — Encounter: Payer: Self-pay | Admitting: Pediatrics

## 2016-02-03 ENCOUNTER — Ambulatory Visit (INDEPENDENT_AMBULATORY_CARE_PROVIDER_SITE_OTHER): Payer: Medicare Other | Admitting: Pediatrics

## 2016-02-03 VITALS — BP 114/67 | HR 70 | Temp 97.6°F | Ht 71.0 in | Wt 188.2 lb

## 2016-02-03 DIAGNOSIS — C673 Malignant neoplasm of anterior wall of bladder: Secondary | ICD-10-CM | POA: Diagnosis not present

## 2016-02-03 DIAGNOSIS — I10 Essential (primary) hypertension: Secondary | ICD-10-CM

## 2016-02-03 DIAGNOSIS — I4891 Unspecified atrial fibrillation: Secondary | ICD-10-CM | POA: Diagnosis not present

## 2016-02-03 DIAGNOSIS — R208 Other disturbances of skin sensation: Secondary | ICD-10-CM

## 2016-02-03 DIAGNOSIS — C7A1 Malignant poorly differentiated neuroendocrine tumors: Secondary | ICD-10-CM | POA: Insufficient documentation

## 2016-02-03 DIAGNOSIS — F419 Anxiety disorder, unspecified: Secondary | ICD-10-CM | POA: Diagnosis not present

## 2016-02-03 LAB — BMP8+EGFR
BUN/Creatinine Ratio: 11 (ref 10–24)
BUN: 11 mg/dL (ref 8–27)
CALCIUM: 8.9 mg/dL (ref 8.6–10.2)
CHLORIDE: 96 mmol/L (ref 96–106)
CO2: 27 mmol/L (ref 18–29)
Creatinine, Ser: 0.98 mg/dL (ref 0.76–1.27)
GFR calc non Af Amer: 75 mL/min/{1.73_m2} (ref >59)
GFR, EST AFRICAN AMERICAN: 87 mL/min/{1.73_m2} (ref >59)
GLUCOSE: 78 mg/dL (ref 65–99)
Potassium: 5 mmol/L (ref 3.5–5.2)
Sodium: 137 mmol/L (ref 134–144)

## 2016-02-03 LAB — COAGUCHEK XS/INR WAIVED
INR: 2.3 — ABNORMAL HIGH (ref 0.9–1.1)
PROTHROMBIN TIME: 28 s

## 2016-02-03 NOTE — Progress Notes (Signed)
  Subjective:   Patient ID: [name], male    DOB: 1941/04/17, 75 y.o.   MRN: [id number] CC: Facial swelling  HPI: [name] is a 75 y.o. male presenting for Facial swelling  Told he has aggressive cancer Has opted for symptom management and not undergo radiation and chemotherapy He is comfortable with this decision  This morning noticed his Face is sore and puffy Didn't know until he started shaving No itching in mouth or face No other swelling No other skin tenderness or rashes Flu shot 7 days ago, no other new exposures recently drove to [place] No more sun exposure than usual Has been eating more food recently, feeling well overall Anxiety well controlled on current regiment No bleeding No heart palpitations  Relevant past medical, surgical, family and social history reviewed. Allergies and medications reviewed and updated. History  Smoking Status  . Former Smoker  . Packs/day: 0.00  . Years: 0.00  . Types: Cigarettes  . Quit date: 09/21/1973  Smokeless Tobacco  . Never Used   ROS: Per HPI   Objective:    BP 114/67   Pulse 70   Temp 97.6 F (36.4 C) (Oral)   Ht '5\' 11"'$  (1.803 m)   Wt 188 lb 3.2 oz (85.4 kg)   BMI 26.25 kg/m   Wt Readings from Last 3 Encounters:  02/03/16 188 lb 3.2 oz (85.4 kg)  12/23/15 184 lb 9.6 oz (83.7 kg)  12/21/15 183 lb 9.6 oz (83.3 kg)    Gen: NAD, alert, cooperative with exam, NCAT EYES: EOMI, no conjunctival injection, or no icterus ENT: OP without erythema LYMPH: no cervical LAD CV: NRRR, normal S1/S2, distal pulses 2+ b/l Resp: CTABL, no wheezes, normal WOB Abd: +BS, soft, mildly tender with palpation, ND. no guarding or organomegaly Ext: No edema, warm Neuro: Alert and oriented, strength equal b/l UE and LE, coordination grossly normal MSK: normal muscle bulk Skin: slight tenderness with palpation over skin below eyes b/l, no swelling, no rash, no redness  Assessment & Plan:  [name] was seen today for facial  swelling.  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 Assunta Found, MD [place]

## 2016-02-05 ENCOUNTER — Encounter: Payer: Self-pay | Admitting: Pharmacist Clinician (PhC)/ Clinical Pharmacy Specialist

## 2016-02-08 ENCOUNTER — Telehealth: Payer: Self-pay | Admitting: Internal Medicine

## 2016-02-08 NOTE — Telephone Encounter (Signed)
Pt calling to let Dr. Caryl Comes know he has terminal cancer, very rare, can do aggressive therapy, but was told it will come back. He would like to discuss this with Caryl Comes or his nurse to get their opinion, but feels he shouldn't have treatment. Pls call to advise

## 2016-02-09 NOTE — Telephone Encounter (Signed)
Dr. Caryl Comes called and spoke with the patient this evening about his bladder cancer and talked through the patient's concerns.

## 2016-02-12 ENCOUNTER — Ambulatory Visit: Payer: Medicare Other | Admitting: Pediatrics

## 2016-02-18 ENCOUNTER — Encounter: Payer: Self-pay | Admitting: Pediatrics

## 2016-02-18 ENCOUNTER — Ambulatory Visit (INDEPENDENT_AMBULATORY_CARE_PROVIDER_SITE_OTHER): Payer: Medicare Other | Admitting: Pediatrics

## 2016-02-18 VITALS — BP 125/79 | HR 76 | Temp 96.9°F | Ht 71.0 in | Wt 188.0 lb

## 2016-02-18 DIAGNOSIS — C673 Malignant neoplasm of anterior wall of bladder: Secondary | ICD-10-CM

## 2016-02-18 DIAGNOSIS — M542 Cervicalgia: Secondary | ICD-10-CM | POA: Diagnosis not present

## 2016-02-18 DIAGNOSIS — I4891 Unspecified atrial fibrillation: Secondary | ICD-10-CM

## 2016-02-18 DIAGNOSIS — I10 Essential (primary) hypertension: Secondary | ICD-10-CM | POA: Diagnosis not present

## 2016-02-18 DIAGNOSIS — I5022 Chronic systolic (congestive) heart failure: Secondary | ICD-10-CM

## 2016-02-18 DIAGNOSIS — F419 Anxiety disorder, unspecified: Secondary | ICD-10-CM
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
## 2016-02-26 ENCOUNTER — Telehealth: Payer: Self-pay | Admitting: Pediatrics

## 2016-02-26 ENCOUNTER — Telehealth (HOSPITAL_COMMUNITY): Payer: Self-pay

## 2016-02-26 NOTE — Telephone Encounter (Signed)
Patient called stating he was having an increase in pain and bleeding when voiding. He is concerned because he is on a blood thinner and is afraid he is "going to bleed to death". After reviewing with MD and PA-C, it was decided that since patient has not been seen at our cancer center yet, he needs to try to contact his former cancer center or ongologist, his PCP, or go to the ER. Patient states that his former oncologist has "turned him loose". He has already tried to contact his PCP before calling here. Patient states he will try to tough it out and if the bleeding continues to worsen he will go to the ER. I mentioned Hospice to him and he seemed to be okay with going that route. He is on coumadin for A. Fib. Explained to patient that the oncologist will talk about Hospice at his visit.

## 2016-02-26 NOTE — Telephone Encounter (Signed)
Left message for patient.   He was told by nurse at [place] center that the doctor ,(oncologist), could discuss hospice at his first visit if he was interested.  He could call back to his last oncologist if he wanted their advise about his current pain and issues.   She also suggested he should go to the emergency room if his symptoms are getting worse. I advised going to emergency room for pain issues also.

## 2016-03-02 ENCOUNTER — Encounter (HOSPITAL_COMMUNITY): Payer: Self-pay

## 2016-03-02 ENCOUNTER — Encounter (HOSPITAL_COMMUNITY): Payer: Medicare Other

## 2016-03-02 ENCOUNTER — Encounter (HOSPITAL_COMMUNITY): Payer: Medicare Other | Attending: Hematology | Admitting: Hematology

## 2016-03-02 VITALS — BP 109/81 | HR 79 | Temp 97.8°F | Resp 18 | Ht 71.0 in | Wt 186.7 lb

## 2016-03-02 DIAGNOSIS — I110 Hypertensive heart disease with heart failure: Secondary | ICD-10-CM | POA: Diagnosis not present

## 2016-03-02 DIAGNOSIS — D500 Iron deficiency anemia secondary to blood loss (chronic): Secondary | ICD-10-CM

## 2016-03-02 DIAGNOSIS — C679 Malignant neoplasm of bladder, unspecified: Secondary | ICD-10-CM

## 2016-03-02 DIAGNOSIS — C7A1 Malignant poorly differentiated neuroendocrine tumors: Secondary | ICD-10-CM | POA: Diagnosis not present

## 2016-03-02 DIAGNOSIS — F329 Major depressive disorder, single episode, unspecified: Secondary | ICD-10-CM | POA: Diagnosis not present

## 2016-03-02 DIAGNOSIS — Z79899 Other long term (current) drug therapy: Secondary | ICD-10-CM | POA: Diagnosis not present

## 2016-03-02 DIAGNOSIS — R319 Hematuria, unspecified: Secondary | ICD-10-CM | POA: Diagnosis not present

## 2016-03-02 DIAGNOSIS — Z85528 Personal history of other malignant neoplasm of kidney: Secondary | ICD-10-CM | POA: Diagnosis not present

## 2016-03-02 DIAGNOSIS — F419 Anxiety disorder, unspecified: Secondary | ICD-10-CM | POA: Diagnosis not present

## 2016-03-02 DIAGNOSIS — Z7901 Long term (current) use of anticoagulants: Secondary | ICD-10-CM | POA: Insufficient documentation

## 2016-03-02 DIAGNOSIS — I5022 Chronic systolic (congestive) heart failure: Secondary | ICD-10-CM | POA: Insufficient documentation

## 2016-03-02 DIAGNOSIS — I442 Atrioventricular block, complete: Secondary | ICD-10-CM | POA: Insufficient documentation

## 2016-03-02 DIAGNOSIS — C673 Malignant neoplasm of anterior wall of bladder: Secondary | ICD-10-CM | POA: Insufficient documentation

## 2016-03-02 DIAGNOSIS — I4891 Unspecified atrial fibrillation: Secondary | ICD-10-CM | POA: Insufficient documentation

## 2016-03-02 DIAGNOSIS — Z833 Family history of diabetes mellitus: Secondary | ICD-10-CM | POA: Diagnosis not present

## 2016-03-02 DIAGNOSIS — Z951 Presence of aortocoronary bypass graft: Secondary | ICD-10-CM | POA: Insufficient documentation

## 2016-03-02 DIAGNOSIS — Z8249 Family history of ischemic heart disease and other diseases of the circulatory system: Secondary | ICD-10-CM | POA: Diagnosis not present

## 2016-03-02 DIAGNOSIS — K219 Gastro-esophageal reflux disease without esophagitis: Secondary | ICD-10-CM | POA: Diagnosis not present

## 2016-03-02 DIAGNOSIS — N400 Enlarged prostate without lower urinary tract symptoms: Secondary | ICD-10-CM | POA: Insufficient documentation

## 2016-03-02 DIAGNOSIS — M797 Fibromyalgia: Secondary | ICD-10-CM | POA: Insufficient documentation

## 2016-03-02 DIAGNOSIS — E785 Hyperlipidemia, unspecified: Secondary | ICD-10-CM | POA: Diagnosis not present

## 2016-03-02 DIAGNOSIS — I2510 Atherosclerotic heart disease of native coronary artery without angina pectoris: Secondary | ICD-10-CM | POA: Diagnosis not present

## 2016-03-02 DIAGNOSIS — Z823 Family history of stroke: Secondary | ICD-10-CM | POA: Insufficient documentation

## 2016-03-02 LAB — COMPREHENSIVE METABOLIC PANEL
ALT: 19 U/L (ref 17–63)
ANION GAP: 4 — AB (ref 5–15)
AST: 23 U/L (ref 15–41)
Albumin: 4 g/dL (ref 3.5–5.0)
Alkaline Phosphatase: 99 U/L (ref 38–126)
BUN: 13 mg/dL (ref 6–20)
CHLORIDE: 97 mmol/L — AB (ref 101–111)
CO2: 30 mmol/L (ref 22–32)
CREATININE: 1.07 mg/dL (ref 0.61–1.24)
Calcium: 9 mg/dL (ref 8.9–10.3)
Glucose, Bld: 100 mg/dL — ABNORMAL HIGH (ref 65–99)
Potassium: 5 mmol/L (ref 3.5–5.1)
Sodium: 131 mmol/L — ABNORMAL LOW (ref 135–145)
Total Bilirubin: 1.2 mg/dL (ref 0.3–1.2)
Total Protein: 7.3 g/dL (ref 6.5–8.1)

## 2016-03-02 LAB — CBC WITH DIFFERENTIAL/PLATELET
Basophils Absolute: 0 10*3/uL (ref 0.0–0.1)
Basophils Relative: 0 %
EOS PCT: 6 %
Eosinophils Absolute: 0.4 10*3/uL (ref 0.0–0.7)
HCT: 42.9 % (ref 39.0–52.0)
Hemoglobin: 14.2 g/dL (ref 13.0–17.0)
LYMPHS ABS: 1.9 10*3/uL (ref 0.7–4.0)
LYMPHS PCT: 28 %
MCH: 30.4 pg (ref 26.0–34.0)
MCHC: 33.1 g/dL (ref 30.0–36.0)
MCV: 91.9 fL (ref 78.0–100.0)
MONO ABS: 0.6 10*3/uL (ref 0.1–1.0)
MONOS PCT: 9 %
Neutro Abs: 3.8 10*3/uL (ref 1.7–7.7)
Neutrophils Relative %: 57 %
PLATELETS: 154 10*3/uL (ref 150–400)
RBC: 4.67 MIL/uL (ref 4.22–5.81)
RDW: 14.3 % (ref 11.5–15.5)
WBC: 6.8 10*3/uL (ref 4.0–10.5)

## 2016-03-02 LAB — LACTATE DEHYDROGENASE: LDH: 144 U/L (ref 98–192)

## 2016-03-02 NOTE — Patient Instructions (Addendum)
[place] at [facility]  Discharge Instructions:  Seen by MD Irene Limbo today.  Labs today.  PET Scan within 2 weeks.  Follow up with MD [place] in 2 weeks.   _______________________________________________________________  Thank you for choosing [place] at [facility] to provide your oncology and hematology care.  To afford each patient quality time with our providers, please arrive at least 15 minutes before your scheduled appointment.  You need to re-schedule your appointment if you arrive 10 or more minutes late.  We strive to give you quality time with our providers, and arriving late affects you and other patients whose appointments are after yours.  Also, if you no show three or more times for appointments you may be dismissed from the clinic.  Again, thank you for choosing [place] at [facility] hope is that these requests will allow you access to exceptional care and in a timely manner. _______________________________________________________________  If you have questions after your visit, please contact our office at [phone] between the hours of 8:30 a.m. and 5:00 p.m. Voicemails left after 4:30 p.m. will not be returned until the following business day. _______________________________________________________________  For prescription refill requests, have your pharmacy contact our office. _______________________________________________________________  Recommendations made by the consultant and any test results will be sent to your referring physician. _______________________________________________________________

## 2016-03-02 NOTE — Progress Notes (Signed)
[name] Kitchen    HEMATOLOGY/ONCOLOGY CONSULTATION NOTE  Date of Service: 03/02/2016  Patient Care Team: Eustaquio Maize, MD as PCP - General (Pediatrics) Dr. Tresa Endo urology Dr. 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 male who has been referred to [place] by Dr .Eustaquio Maize, MD And Dr Nena Alexander for evaluation and continued management of newly diagnosed stage II poorly differentiated pT2NxMx, Grade 3, neuroendocrine bladder cancer.  Patient had presented with urinary retention on 11/16/2015 to the emergency room and had a CT of the abdomen and pelvis that showed a 4 cm bladder mass. He subsequently had a TURBT on 12/29/2015 that showed a poorly differentiated neuroendocrine bladder tumor invasive into the muscularis propria suggesting at least T2 status. He reports having intermittent hematuria from this accentuated by the fact that he is on Coumadin for atrial fibrillation.  He had staging CT of the chest abdomen pelvis on 01/21/2016 that did not show any overt evidence of metastatic disease.   Patient is accompanied by his daughter who helps speak for him. Patient lives with his daughter and she is his primary caregiver. He has multiple medical comorbidities including atrial fibrillation on chronic Coumadin, chronic systolic CHF with an ejection fraction of 30-35%, history of complete heart block with BiV ICD, BPH with chronically elevated PSA, coronary artery disease status post 4 vessel CABG in 1991 and redo CABG in 2007, depression with significant anxiety issues and panic attacks, GERD, fibromyalgia, hypertension, dyslipidemia.  Patient was offered possible treatment with carboplatin plus etoposide followed by radiation therapy for his representation of the bladder poorly differentiated neuroendocrine tumor. As  per outside notes and his daughter's account a decision was made hold off on any potentially curative treatment including chemotherapy and to focus on supportive cares given patient's medical comorbidities and personal preference.  I discussed in detail the pros and cons of treatment with chemotherapy followed by radiation versus best supportive cares and the fact that there is a small but possible chance of treatment with curative intent. The patient today is quite unclear about his goals of care and his daughter predominantly speaks for him. She reports that she saw a friend progressively declined with liver cancer and that colors her view of what cancer treatment might look like.   Patient notes that he needs some more time to think about his final decision and wonders if his cancer is already metastatic at this time. He notes no overt hematuria at this time. No overt headaches. No focal neurological deficits. Notes some mild nausea which could be from anxiety. No abdominal pain. No overt change in bowel habits.   MEDICAL HISTORY:  Past Medical History:  Diagnosis Date  . A-fib (HCC)    a. chronic coumadin  . Anxiety   . Atrial flutter ([place])    a. 01/2011 s/p unsuccessful RFCA complicated by CHB req PPM.  . Bladder mass 11/17/2015   Urothelial carcinoma until proven otherwise.  [name] Kitchen BPH (benign prostatic hyperplasia)    a. elevated PSA  . CAD (coronary artery disease)    a. 1991 s/p CABGx4;  b. redo CABG in 2007 (SVG->OM, VG->PDA->PLV);  c. 01/2011 Cath: LM 80ost, LAD 100p, LCX 100ost, OM1 100, RCA 100, LIMA->LAD ok, VG->PDA ok, VG->OM ok;  d. 03/2012 MV: EF 32% inf & inflat scar w/o ischemia; e. Lexi MV: EF 33%,  inf infarct, no ischemia. f. lexiscan 06/12/2014 RCA scar, no ischemia, EF 26%  . CHB (complete heart block) ([place])    a. 01/2011: in setting of RFCA, s/p SJM 2110 Accent DC PPM, ser # [id number].  [name] Kitchen Chronic systolic CHF (congestive heart failure), NYHA class 2 ([place])    a. 06/2012 Echo:  EF 25%, distal sept and apical AK, sev dil LV, mild LVH, mod dil LA.  . Depression    "since [id number]" (08/29/2012)  . Elevated PSA, less than 10 ng/ml   . Fibromyalgia   . GERD (gastroesophageal reflux disease)   . Hiatal hernia    a. 03/01/2013 EGD and esoph dil.  [name] Kitchen Hyperlipidemia   . Hypertension   . Hyponatremia 11/17/2015   Water intoxication versus SIADH.  . Ischemic cardiomyopathy    a. 06/2012 EF 25% by echo.  . Kidney cysts    a. bilateral  . Pacemaker    explanted 06/15/15 >>> CRT-D STJ device, Dr. Caryl Comes  . Panic attacks   . Skin cancer    'above left eyebrow; tip of my nose" (08/29/2012)    SURGICAL HISTORY: Past Surgical History:  Procedure Laterality Date  . ATRIAL FLUTTER ABLATION  01/2011   "didn't work" (08/29/2012)  . CARDIAC CATHETERIZATION  10/30/2009   Grafts patent. Mild to moderate LV dysfunction. EF 45%. Managed medically.   [name] Kitchen CARDIAC CATHETERIZATION     multiple  . CARDIAC CATHETERIZATION N/A 04/28/2015   Procedure: Left Heart Cath and Cors/Grafts Angiography;  Surgeon: Belva Crome, MD;  Location: [facility];  Service: Cardiovascular;  Laterality: N/A;  . CARDIOVERSION  05/25/2012   Procedure: CARDIOVERSION;  Surgeon: Darlin Coco, MD;  Location: [facility];  Service: Cardiovascular;  Laterality: N/A;  . CATARACT EXTRACTION W/ INTRAOCULAR LENS  IMPLANT, BILATERAL  2012  . CHOLECYSTECTOMY  2007  . CORONARY ARTERY BYPASS GRAFT  08/27/1989   CABG X 5  . CORONARY ARTERY BYPASS GRAFT  06/2005   CABG X 3  . EP IMPLANTABLE DEVICE N/A 06/15/2015   STJ, CRT-D, Dr. Caryl Comes  . EXCISIONAL HEMORRHOIDECTOMY  ~ 1980  . [place]   "growth on the bone taken off; not cancer" (08/29/2012)  . INGUINAL HERNIA REPAIR  1960's   "left I think"  . INGUINAL HERNIA REPAIR Right 10/12/2015   Procedure: RIGHT INGUINAL HERNIA REPAIR WITH MESH;  Surgeon: Jackolyn Confer, MD;  Location: [facility];  Service: General;  Laterality: Right;  . INSERT / REPLACE /  REMOVE PACEMAKER  01/2011   initial placement  . INSERTION OF MESH Right 10/12/2015   Procedure: INSERTION OF MESH, RIGHT INGUINAL HERNIA;  Surgeon: Jackolyn Confer, MD;  Location: [facility];  Service: General;  Laterality: Right;  . SKIN CANCER EXCISION     'above left eyebrow; tip of my nose" (08/29/2012)  . TONSILLECTOMY  1960    SOCIAL HISTORY: Social History   Social History  . Marital status: Widowed    Spouse name: N/A  . Number of children: 2  . Years of education: 12   Occupational History  . Retired    Social History Main Topics  . Smoking status: Former Smoker    Packs/day: 0.00    Years: 0.00    Types: Cigarettes    Quit date: 09/21/1973  . Smokeless tobacco: Never Used  . Alcohol use No  . Drug use: No  . Sexual activity: No   Other Topics Concern  . Not on file   Social History Narrative  Wife passed away in 08/2011. Lives in [place] with his daughter and her family.  Retired from [employer].    FAMILY HISTORY: Family History  Problem Relation Age of Onset  . Stroke Mother   . Heart attack Father     died @ 88, mult MI's.  [name] Kitchen Heart disease Father   . Hypertension Father   . Diabetes Sister     ALLERGIES:  is allergic to hydrocodone-acetaminophen; prednisone; sulfa drugs cross reactors; ultram [tramadol hcl]; xarelto [rivaroxaban]; and zithromax [azithromycin].  MEDICATIONS:  Current Outpatient Prescriptions  Medication Sig Dispense Refill  . acetaminophen (TYLENOL) 500 MG tablet Take 500 mg by mouth every 8 (eight) hours as needed for mild pain.     [name] Kitchen ALPRAZolam (XANAX) 1 MG tablet Take 1 tablet (1 mg total) by mouth 3 (three) times daily as needed. For anxiety 270 tablet 1  . carvedilol (COREG) 6.25 MG tablet Take 1 tablet (6.25 mg total) by mouth 2 (two) times daily. 180 tablet 2  . doxepin (SINEQUAN) 25 MG capsule Take 25 mg (1 capsule) by mouth in the morning and 50 mg (2 capsules) by mouth at night. (Patient taking differently: Take 25-50  mg by mouth 2 (two) times daily. Take 25 mg (1 capsule) by mouth in the morning and 50 mg (2 capsules) by mouth at night.) 270 capsule 3  . fluticasone (FLONASE) 50 MCG/ACT nasal spray Place 2 sprays into both nostrils daily. 16 g 6  . furosemide (LASIX) 20 MG tablet Take 1 tablet (20 mg total) by mouth daily. 30 tablet 1  . gabapentin (NEURONTIN) 100 MG capsule Take 100 mg by mouth 4 (four) times daily.    [name] Kitchen guaiFENesin (MUCINEX) 600 MG 12 hr tablet Take 600 mg by mouth 2 (two) times daily. Reported on 11/12/2015    . hydroxypropyl methylcellulose / hypromellose (ISOPTO TEARS / GONIOVISC) 2.5 % ophthalmic solution Place 1 drop into both eyes 3 (three) times daily as needed for dry eyes.    . isosorbide mononitrate (IMDUR) 60 MG 24 hr tablet Take 1 tablet (60 mg total) by mouth daily. 90 tablet 2  . lisinopril (PRINIVIL,ZESTRIL) 5 MG tablet Take 1 tablet (5 mg total) by mouth every other day. 90 tablet 3  . loratadine (CLARITIN) 10 MG tablet Take 10 mg by mouth daily.    . Multiple Vitamins-Minerals (PRESERVISION AREDS 2) CAPS Take 2 capsules by mouth daily. Reported on 06/26/2015    . nitroGLYCERIN (NITROSTAT) 0.4 MG SL tablet DISSOLVE ONE TABLET UNDER THE TONGUE EVERY 5 MINUTES AS NEEDED FOR CHEST PAIN.  DO NOT EXCEED A TOTAL OF 3 DOSES IN 15 MINUTES 100 tablet 3  . omeprazole (PRILOSEC) 20 MG capsule Take 1 capsule (20 mg total) by mouth 2 (two) times daily. 180 capsule 3  . polyethylene glycol (MIRALAX / GLYCOLAX) packet Take 17 g by mouth daily.     . rosuvastatin (CRESTOR) 10 MG tablet Take 1 tablet (10 mg total) by mouth daily. 90 tablet 3  . warfarin (JANTOVEN) 5 MG tablet Take 5 mg by mouth every evening.     No current facility-administered medications for this visit.     REVIEW OF SYSTEMS:    10 Point review of Systems was done is negative except as noted above.  PHYSICAL EXAMINATION: ECOG PERFORMANCE STATUS: 2 - Symptomatic, <50% confined to bed  . Vitals:   03/02/16 0851  BP:  109/81  Pulse: 79  Resp: 18  Temp: 97.8 F (36.6 C)  Filed Weights   03/02/16 0851  Weight: 186 lb 11.2 oz (84.7 kg)   .Body mass index is 26.04 kg/m.  GENERAL:alert, in no acute distress and comfortable SKIN: skin color, texture, turgor are normal, no rashes or significant lesions EYES: normal, conjunctiva are pink and non-injected, sclera clear OROPHARYNX:no exudate, no erythema and lips, buccal mucosa, and tongue normal  NECK: supple, no JVD, thyroid normal size, non-tender, without nodularity LYMPH:  no palpable lymphadenopathy in the cervical, axillary or inguinal LUNGS: clear to auscultation with normal respiratory effort HEART: regular rate & rhythm,  no murmurs and no lower extremity edema ABDOMEN: abdomen soft, non-tender, normoactive bowel sounds  Musculoskeletal: no cyanosis of digits and no clubbing  PSYCH: alert & oriented x 3 with fluent speech NEURO: no focal motor/sensory deficits  LABORATORY DATA:  I have reviewed the data as listed  . CBC Latest Ref Rng & Units 03/02/2016 11/20/2015 11/19/2015  WBC 4.0 - 10.5 K/uL 6.8 5.1 6.5  Hemoglobin 13.0 - 17.0 g/dL 14.2 11.8(L) 11.6(L)  Hematocrit 39.0 - 52.0 % 42.9 36.4(L) 35.5(L)  Platelets 150 - 400 K/uL 154 156 155    . CMP Latest Ref Rng & Units 03/02/2016 02/03/2016 11/20/2015  Glucose 65 - 99 mg/dL 100(H) 78 84  BUN 6 - 20 mg/dL 13 11 18   Creatinine 0.61 - 1.24 mg/dL 1.07 0.98 0.73  Sodium 135 - 145 mmol/L 131(L) 137 133(L)  Potassium 3.5 - 5.1 mmol/L 5.0 5.0 4.1  Chloride 101 - 111 mmol/L 97(L) 96 97(L)  CO2 22 - 32 mmol/L 30 27 28   Calcium 8.9 - 10.3 mg/dL 9.0 8.9 8.4(L)  Total Protein 6.5 - 8.1 g/dL 7.3 - -  Total Bilirubin 0.3 - 1.2 mg/dL 1.2 - -  Alkaline Phos 38 - 126 U/L 99 - -  AST 15 - 41 U/L 23 - -  ALT 17 - 63 U/L 19 - -   Surgical Pathology Tissue Exam8/29/2017 [facility] Specimen  Tissue - Bladder  Result Narrative    ACCESSION NUMBER:[id number] RECEIVED:  12/29/2015 ORDERING PHYSICIAN:RONALD LEE DAVIS III , MD PATIENT NAME:Churilla, [name] SURGICAL PATHOLOGY REPORT  FINAL PATHOLOGIC DIAGNOSIS MICROSCOPIC EXAMINATION AND DIAGNOSIS  TUMOR, BLADDER, EXCISION: Poorly-differentiated carcinoma with neuroendocrine features. Muscularis propria present and involved by carcinoma. See comment.  Comment: Immunohistochemical stains performed on block A3 show that the malignant cells are positive for synaptophysin and chromogranin and are negative for cytokeratin903 and p63. The staining pattern confirms the neuroendocrine nature of this lesion.   I have personally reviewed the slides and/or other related materials referenced, and have edited the report as part of my pathologic assessment and final interpretation.  Electronically Signed Out By: Santa Lighter , MD 01/05/2016 14:39:38  zts/avm  Specimen(s) Received  Tumor, bladder, excision     RADIOGRAPHIC STUDIES: I have personally reviewed the radiological images as listed and agreed with the findings in the report. CT CHEST ABDOMEN PELVIS W CONTRAST9/21/2017 [facility] Result Impression   1.Diffusely thickened bladder wall in this patient with known history of neuroendocrine bladder cancer. There is indistinctness of the interface between the bladder and perivesicular fat with associated haziness of retrovesicular fat. Small amount of pelvic intraperitoneal free fluid. 2.Bilateral hypodense nonenhancing lesions of the kidneys which are similar to prior. 3.Multiple bilateral pulmonary nodules which according to the 2017 Fleischner criteria to require no follow-up. 4.Other incidental findings as above.    ASSESSMENT & PLAN:   75 year old Caucasian male with multiple  medical comorbidities with ECOG performance status of 2 with  #1 stage II pT2NxMx, Grade 3, poorly differentiated neuroendocrine bladder cancer. Patient  was previously evaluated at [facility] by Dr. Dorthula Matas and was offered carboplatin plus etoposide followed by radiation therapy. At that time he and his daughter had chosen to proceed with care closer to home and consider primarily a supportive cares and possible hospice involvement. Plan -Patient currently notes some mild nausea but is eating well and has no other significant focal symptoms. -He appears to be on the fence with regards to his goals of care and whether he would want to pursue potentially curative chemotherapy and radiation vs focusing on best supportive cares. -He is keen to know the status of his current disease prior to making a final decision. -We shall send him up for a PET/CT scan and a CT of the head with and without contrast to complete his pretreatment staging. -He will return with Dr. Whitney Muse in 2 weeks with his scans to define his final goals of care and determine if he would want to consider carboplatin and etoposide chemotherapy followed by radiation. -If he decides not to go with chemotherapy and radiation that will need to be a discussion regarding more details of his goals of care including possibly discontinuing his nonessential medications, consideration of hospice enrollment and whether or not he would want palliative radiation therapy if his symptoms got worse. -Currently does not appear to have a significant symptom burden.  #2 . Patient Active Problem List   Diagnosis Date Noted  . High grade neuroendocrine carcinoma ([place]) 02/03/2016  . Malignant neoplasm of anterior wall of bladder ([place]) 02/03/2016  . Constipation 12/21/2015  . Elevated PSA 11/17/2015  . Chronic systolic congestive heart failure ([place]) 06/15/2015  . Bilateral calf pain 03/11/2015  . Neck pain 03/11/2015  . Claudication of gluteal region ([place]) 01/10/2014  . Ventral hernia 01/23/2013  . Angina pectoris ([place]) 08/30/2012  . Chronic anticoagulation   . Umbilical  hernia [id number]  . CAD (coronary artery disease) 10/20/2011  . Atrial fibrillation-Permanent 05/31/2011  . Complete heart block ([place]) 05/31/2011  . Biventricular   defibrillator St Jude  05/31/2011  . Hx of CABG 09/23/2010  . Ischemic cardiomyopathy   . Hypertension   . Hyperlipidemia   . GERD (gastroesophageal reflux disease)   . Anxiety   . Fibromyalgia   - Continue follow-up with primary care physician for management of other medical comorbidities.   All of the patients and his daughters questions were answered to their apparent satisfaction. The patient knows to call the clinic with any problems, questions or concerns.  I spent 50 minutes counseling the patient face to face. The total time spent in the appointment was 60 minutes and more than 50% was on counseling and direct patient cares.    Sullivan Lone MD [place] AAHIVMS [facility] Hematology/Oncology Physician [facility]  (Office):       [phone] (Work cell):  [phone] (Fax):           [phone]  03/02/2016 9:03 AM

## 2016-03-03 ENCOUNTER — Encounter (HOSPITAL_COMMUNITY): Payer: Self-pay | Admitting: Emergency Medicine

## 2016-03-03 ENCOUNTER — Emergency Department (HOSPITAL_COMMUNITY)
Admission: EM | Admit: 2016-03-03 | Discharge: 2016-03-04 | Disposition: A | Discharge status: Home / Self Care | Payer: Medicare Other | Attending: Emergency Medicine | Admitting: Emergency Medicine

## 2016-03-03 DIAGNOSIS — I2510 Atherosclerotic heart disease of native coronary artery without angina pectoris: Secondary | ICD-10-CM | POA: Insufficient documentation

## 2016-03-03 DIAGNOSIS — R339 Retention of urine, unspecified: Secondary | ICD-10-CM

## 2016-03-03 DIAGNOSIS — I5022 Chronic systolic (congestive) heart failure: Secondary | ICD-10-CM | POA: Diagnosis not present

## 2016-03-03 DIAGNOSIS — N3001 Acute cystitis with hematuria: Secondary | ICD-10-CM | POA: Insufficient documentation

## 2016-03-03 DIAGNOSIS — Z87891 Personal history of nicotine dependence: Secondary | ICD-10-CM | POA: Insufficient documentation

## 2016-03-03 DIAGNOSIS — I110 Hypertensive heart disease with heart failure: Secondary | ICD-10-CM | POA: Diagnosis not present

## 2016-03-03 DIAGNOSIS — Z79899 Other long term (current) drug therapy: Secondary | ICD-10-CM | POA: Insufficient documentation

## 2016-03-03 LAB — CBC WITH DIFFERENTIAL/PLATELET
BASOS ABS: 0 10*3/uL (ref 0.0–0.1)
BASOS PCT: 0 %
EOS ABS: 0.4 10*3/uL (ref 0.0–0.7)
Eosinophils Relative: 7 %
HCT: 37.7 % — ABNORMAL LOW (ref 39.0–52.0)
HEMOGLOBIN: 12.3 g/dL — AB (ref 13.0–17.0)
Lymphocytes Relative: 38 %
Lymphs Abs: 2 10*3/uL (ref 0.7–4.0)
MCH: 29.8 pg (ref 26.0–34.0)
MCHC: 32.6 g/dL (ref 30.0–36.0)
MCV: 91.3 fL (ref 78.0–100.0)
MONOS PCT: 9 %
Monocytes Absolute: 0.5 10*3/uL (ref 0.1–1.0)
NEUTROS PCT: 46 %
Neutro Abs: 2.5 10*3/uL (ref 1.7–7.7)
Platelets: 131 10*3/uL — ABNORMAL LOW (ref 150–400)
RBC: 4.13 MIL/uL — ABNORMAL LOW (ref 4.22–5.81)
RDW: 14.3 % (ref 11.5–15.5)
WBC: 5.3 10*3/uL (ref 4.0–10.5)

## 2016-03-03 LAB — BASIC METABOLIC PANEL
ANION GAP: 6 (ref 5–15)
BUN: 19 mg/dL (ref 6–20)
CO2: 27 mmol/L (ref 22–32)
Calcium: 8 mg/dL — ABNORMAL LOW (ref 8.9–10.3)
Chloride: 95 mmol/L — ABNORMAL LOW (ref 101–111)
Creatinine, Ser: 1.21 mg/dL (ref 0.61–1.24)
GFR calc Af Amer: >60 mL/min (ref >60)
GFR, EST NON AFRICAN AMERICAN: 57 mL/min — AB (ref >60)
GLUCOSE: 95 mg/dL (ref 65–99)
POTASSIUM: 4 mmol/L (ref 3.5–5.1)
SODIUM: 128 mmol/L — AB (ref 135–145)

## 2016-03-03 LAB — PROTIME-INR
INR: 2.5
Prothrombin Time: 27.5 seconds — ABNORMAL HIGH (ref 11.4–15.2)

## 2016-03-03 NOTE — ED Provider Notes (Signed)
[facility] DEPT Provider Note   CSN: [id number] Arrival date & time: 03/03/16  2251  By signing my name below, I, [name], attest that this documentation has been prepared under the direction and in the presence of physician practitioner, Merrily Pew, MD. Electronically Signed: Dora [name], Scribe. 03/03/2016. 11:01 PM.  History   Chief Complaint Chief Complaint  Patient presents with  . Urinary Retention    The history is provided by the patient. No language interpreter was used.     HPI Comments: [name] is a 75 y.o. male with PMHx significant for BPH and bladder cancer who presents to the Emergency Department complaining of sudden onset, constant, difficulty urinating beginning around 1 PM this afternoon. Pt states he has seen several drops of blood in his urine when he is able to urinate. Pt reports he feels like he has to urinate and notes he has hydrated adequately today. He reports a h/o bladder cancer and is unsure if he will attend chemotherapy due to his cardiac history. Pt is on Coumadin. He denies back pain, weakness/numbness in his lower extremities, neuro deficits, saddle anesthesia, or any other associated symptoms.  Past Medical History:  Diagnosis Date  . A-fib (HCC)    a. chronic coumadin  . Anxiety   . Atrial flutter ([place])    a. 01/2011 s/p unsuccessful RFCA complicated by CHB req PPM.  . Bladder mass 11/17/2015   Urothelial carcinoma until proven otherwise.  [name] Kitchen BPH (benign prostatic hyperplasia)    a. elevated PSA  . CAD (coronary artery disease)    a. 1991 s/p CABGx4;  b. redo CABG in 2007 (SVG->OM, VG->PDA->PLV);  c. 01/2011 Cath: LM 80ost, LAD 100p, LCX 100ost, OM1 100, RCA 100, LIMA->LAD ok, VG->PDA ok, VG->OM ok;  d. 03/2012 MV: EF 32% inf & inflat scar w/o ischemia; e. Lexi MV: EF 33%, inf infarct, no ischemia. f. lexiscan 06/12/2014 RCA scar, no ischemia, EF 26%  . CHB (complete heart block) ([place])    a. 01/2011: in setting of RFCA, s/p  SJM 2110 Accent DC PPM, ser # [id number].  [name] Kitchen Chronic systolic CHF (congestive heart failure), NYHA class 2 ([place])    a. 06/2012 Echo: EF 25%, distal sept and apical AK, sev dil LV, mild LVH, mod dil LA.  . Depression    "since [id number]" (08/29/2012)  . Elevated PSA, less than 10 ng/ml   . Fibromyalgia   . GERD (gastroesophageal reflux disease)   . Hiatal hernia    a. 03/01/2013 EGD and esoph dil.  [name] Kitchen Hyperlipidemia   . Hypertension   . Hyponatremia 11/17/2015   Water intoxication versus SIADH.  . Ischemic cardiomyopathy    a. 06/2012 EF 25% by echo.  . Kidney cysts    a. bilateral  . Pacemaker    explanted 06/15/15 >>> CRT-D STJ device, Dr. Caryl Comes  . Panic attacks   . Skin cancer    'above left eyebrow; tip of my nose" (08/29/2012)    Patient Active Problem List   Diagnosis Date Noted  . 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 Constipation 12/21/2015  . Elevated PSA 11/17/2015  . Chronic systolic congestive heart failure ([place]) 06/15/2015  . Bilateral calf pain 03/11/2015  . Neck pain 03/11/2015  . Claudication of gluteal region ([place]) 01/10/2014  . Ventral hernia 01/23/2013  . Angina pectoris ([employer]) 08/30/2012  . Chronic anticoagulation   . Umbilical hernia [id number]  . CAD (  coronary artery disease) 10/20/2011  . Atrial fibrillation-Permanent 05/31/2011  . Complete heart block ([place]) 05/31/2011  . Biventricular   defibrillator St Jude  05/31/2011  . Hx of CABG 09/23/2010  . Ischemic cardiomyopathy   . Hypertension   . Hyperlipidemia   . GERD (gastroesophageal reflux disease)   . Anxiety   . Fibromyalgia     Past Surgical History:  Procedure Laterality Date  . ATRIAL FLUTTER ABLATION  01/2011   "didn't work" (08/29/2012)  . CARDIAC CATHETERIZATION  10/30/2009   Grafts patent. Mild to moderate LV dysfunction. EF 45%. Managed medically.   [name] Kitchen CARDIAC CATHETERIZATION     multiple  . CARDIAC  CATHETERIZATION N/A 04/28/2015   Procedure: Left Heart Cath and Cors/Grafts Angiography;  Surgeon: Belva Crome, MD;  Location: [facility];  Service: Cardiovascular;  Laterality: N/A;  . CARDIOVERSION  05/25/2012   Procedure: CARDIOVERSION;  Surgeon: Darlin Coco, MD;  Location: [facility];  Service: Cardiovascular;  Laterality: N/A;  . CATARACT EXTRACTION W/ INTRAOCULAR LENS  IMPLANT, BILATERAL  2012  . CHOLECYSTECTOMY  2007  . CORONARY ARTERY BYPASS GRAFT  08/27/1989   CABG X 5  . CORONARY ARTERY BYPASS GRAFT  06/2005   CABG X 3  . EP IMPLANTABLE DEVICE N/A 06/15/2015   STJ, CRT-D, Dr. Caryl Comes  . EXCISIONAL HEMORRHOIDECTOMY  ~ 1980  . [place]   "growth on the bone taken off; not cancer" (08/29/2012)  . INGUINAL HERNIA REPAIR  1960's   "left I think"  . INGUINAL HERNIA REPAIR Right 10/12/2015   Procedure: RIGHT INGUINAL HERNIA REPAIR WITH MESH;  Surgeon: Jackolyn Confer, MD;  Location: [facility];  Service: General;  Laterality: Right;  . INSERT / REPLACE / REMOVE PACEMAKER  01/2011   initial placement  . INSERTION OF MESH Right 10/12/2015   Procedure: INSERTION OF MESH, RIGHT INGUINAL HERNIA;  Surgeon: Jackolyn Confer, MD;  Location: [facility];  Service: General;  Laterality: Right;  . SKIN CANCER EXCISION     'above left eyebrow; tip of my nose" (08/29/2012)  . TONSILLECTOMY  1960       Home Medications    Prior to Admission medications   Medication Sig Start Date End Date Taking? Authorizing Provider  acetaminophen (TYLENOL) 500 MG tablet Take 500 mg by mouth every 8 (eight) hours as needed for mild pain.    Yes Historical Provider, MD  ALPRAZolam Duanne Moron) 1 MG tablet Take 1 tablet (1 mg total) by mouth 3 (three) times daily as needed. For anxiety 12/21/15  Yes Eustaquio Maize, MD  carvedilol (COREG) 6.25 MG tablet Take 1 tablet (6.25 mg total) by mouth 2 (two) times daily. 11/06/15  Yes Minus Breeding, MD  doxepin (SINEQUAN) 25 MG capsule Take 25 mg (1 capsule) by  mouth in the morning and 50 mg (2 capsules) by mouth at night. Patient taking differently: Take 25-50 mg by mouth 2 (two) times daily. Take 25 mg (1 capsule) by mouth in the morning and 50 mg (2 capsules) by mouth at night. 11/12/15  Yes Eustaquio Maize, MD  fluticasone (FLONASE) 50 MCG/ACT nasal spray Place 2 sprays into both nostrils daily. 04/30/15  Yes Sharion Balloon, FNP  furosemide (LASIX) 20 MG tablet Take 1 tablet (20 mg total) by mouth daily. 11/20/15  Yes Rexene Alberts, MD  gabapentin (NEURONTIN) 100 MG capsule Take 100 mg by mouth 4 (four) times daily.   Yes Historical Provider, MD  guaiFENesin (MUCINEX) 600 MG 12 hr tablet  Take 600 mg by mouth 2 (two) times daily as needed for cough or to loosen phlegm. Reported on 11/12/2015   Yes Historical Provider, MD  hydroxypropyl methylcellulose / hypromellose (ISOPTO TEARS / GONIOVISC) 2.5 % ophthalmic solution Place 1 drop into both eyes 3 (three) times daily as needed for dry eyes.   Yes Historical Provider, MD  isosorbide mononitrate (IMDUR) 60 MG 24 hr tablet Take 1 tablet (60 mg total) by mouth daily. 11/06/15  Yes Minus Breeding, MD  lisinopril (PRINIVIL,ZESTRIL) 5 MG tablet Take 1 tablet (5 mg total) by mouth every other day. 11/13/15  Yes Minus Breeding, MD  loratadine (CLARITIN) 10 MG tablet Take 10 mg by mouth daily.   Yes Historical Provider, MD  Multiple Vitamins-Minerals (PRESERVISION AREDS 2) CAPS Take 2 capsules by mouth daily. Reported on 06/26/2015   Yes Historical Provider, MD  nitroGLYCERIN (NITROSTAT) 0.4 MG SL tablet DISSOLVE ONE TABLET UNDER THE TONGUE EVERY 5 MINUTES AS NEEDED FOR CHEST PAIN.  DO NOT EXCEED A TOTAL OF 3 DOSES IN 15 MINUTES 05/11/15  Yes Darlin Coco, MD  omeprazole (PRILOSEC) 20 MG capsule Take 1 capsule (20 mg total) by mouth 2 (two) times daily. 03/25/13  Yes Darlin Coco, MD  polyethylene glycol (MIRALAX / GLYCOLAX) packet Take 17 g by mouth daily.    Yes Historical Provider, MD  rosuvastatin (CRESTOR) 10  MG tablet Take 1 tablet (10 mg total) by mouth daily. 03/25/15  Yes Darlin Coco, MD  warfarin (JANTOVEN) 5 MG tablet Take 5 mg by mouth every evening.   Yes Historical Provider, MD  cephALEXin (KEFLEX) 500 MG capsule Take 1 capsule (500 mg total) by mouth 4 (four) times daily. 03/04/16   Merrily Pew, MD    Family History Family History  Problem Relation Age of Onset  . Stroke Mother   . Heart attack Father     died @ 29, mult MI's.  [name] Kitchen Heart disease Father   . Hypertension Father   . Diabetes Sister     Social History Social History  Substance Use Topics  . Smoking status: Former Smoker    Packs/day: 0.00    Years: 0.00    Types: Cigarettes    Quit date: 09/21/1973  . Smokeless tobacco: Never Used  . Alcohol use No     Allergies   Hydrocodone-acetaminophen; Prednisone; Sulfa drugs cross reactors; Ultram [tramadol hcl]; Xarelto [rivaroxaban]; and Zithromax [azithromycin]   Review of Systems Review of Systems  Genitourinary: Positive for difficulty urinating and hematuria.  Musculoskeletal: Negative for back pain.  Neurological: Negative for weakness and numbness.  All other systems reviewed and are negative.   Physical Exam Updated Vital Signs BP 131/76 (BP Location: Right Arm)   Pulse 72   Temp 97.7 F (36.5 C) (Oral)   Resp 18   Ht 5\' 11"  (1.803 m)   Wt 186 lb (84.4 kg)   SpO2 100%   BMI 25.94 kg/m   Physical Exam  Constitutional: He is oriented to person, place, and time. He appears well-developed and well-nourished. No distress.  HENT:  Head: Normocephalic and atraumatic.  Eyes: Conjunctivae and EOM are normal.  Neck: Neck supple. No tracheal deviation present.  Cardiovascular: Normal rate.   Pulmonary/Chest: Effort normal. No respiratory distress.  Abdominal: There is tenderness (suprapubic).  No CVA tenderness.  Musculoskeletal: Normal range of motion.  No midline back tenderness.  Neurological: He is alert and oriented to person, place, and  time.  No saddle anesthesia. Symmetric DTR's in bilateral patellas.  Normal lower extremity strength and sensation  Skin: Skin is warm and dry.  Psychiatric: He has a normal mood and affect. His behavior is normal.  Nursing note and vitals reviewed.   ED Treatments / Results  Labs (all labs ordered are listed, but only abnormal results are displayed) Labs Reviewed  CBC WITH DIFFERENTIAL/PLATELET - Abnormal; Notable for the following:       Result Value   RBC 4.13 (*)    Hemoglobin 12.3 (*)    HCT 37.7 (*)    Platelets 131 (*)    All other components within normal limits  BASIC METABOLIC PANEL - Abnormal; Notable for the following:    Sodium 128 (*)    Chloride 95 (*)    Calcium 8.0 (*)    GFR calc non Af Amer 57 (*)    All other components within normal limits  PROTIME-INR - Abnormal; Notable for the following:    Prothrombin Time 27.5 (*)    All other components within normal limits  URINALYSIS, ROUTINE W REFLEX MICROSCOPIC (NOT AT [facility]) - Abnormal; Notable for the following:    APPearance HAZY (*)    Hgb urine dipstick LARGE (*)    Protein, ur >300 (*)    Leukocytes, UA SMALL (*)    All other components within normal limits  URINE MICROSCOPIC-ADD ON - Abnormal; Notable for the following:    Squamous Epithelial / LPF 6-30 (*)    Bacteria, UA MANY (*)    All other components within normal limits  URINE CULTURE    EKG  EKG Interpretation None       Radiology No results found.  Procedures Procedures (including critical care time)  DIAGNOSTIC STUDIES: Oxygen Saturation is 100% on RA, normal by my interpretation.    COORDINATION OF CARE: 11:06 PM Discussed treatment plan with pt at bedside and pt agreed to plan.  Medications Ordered in ED Medications  cephALEXin (KEFLEX) capsule 500 mg (not administered)     Initial Impression / Assessment and Plan / ED Course  I have reviewed the triage vital signs and the nursing notes.  Pertinent labs & imaging  results that were available during my care of the patient were reviewed by me and considered in my medical decision making (see chart for details).  Clinical Course    Possibly BPH as cause of retention. Considered metastatic cancer however has no back pain, motor or sensory deficits or other e/o cauda equina at this time. Also considered obstruction 2/2 clot however there isn't any blood in his urine or large clots on foley to suggest it. UTI, started on Keflex, culture sent.  Will leave foley in, encourage urology follow up.   I personally performed the services described in this documentation, which was scribed in my presence. The recorded information has been reviewed and is accurate.   Final Clinical Impressions(s) / ED Diagnoses   Final diagnoses:  Urinary retention  Acute cystitis with hematuria    New Prescriptions New Prescriptions   CEPHALEXIN (KEFLEX) 500 MG CAPSULE    Take 1 capsule (500 mg total) by mouth 4 (four) times daily.     Merrily Pew, MD 03/04/16 [phone]

## 2016-03-03 NOTE — ED Triage Notes (Signed)
Pt reports he has bladder cancer, has been unable to void since 1300 today. Pt states he feels the need to urinate but is unable to void.

## 2016-03-04 LAB — URINE MICROSCOPIC-ADD ON

## 2016-03-04 LAB — URINALYSIS, ROUTINE W REFLEX MICROSCOPIC
BILIRUBIN URINE: NEGATIVE
Glucose, UA: NEGATIVE mg/dL
KETONES UR: NEGATIVE mg/dL
NITRITE: NEGATIVE
Protein, ur: >300 mg/dL — AB
SPECIFIC GRAVITY, URINE: 1.02 (ref 1.005–1.030)
pH: 6 (ref 5.0–8.0)

## 2016-03-04 MED ORDER — CEPHALEXIN 500 MG PO CAPS: 500.0000 mg | ORAL_CAPSULE | Freq: Four times a day (QID) | ORAL | 0 refills | Status: DC

## 2016-03-04 MED ORDER — NITROFURANTOIN MONOHYD MACRO 100 MG PO CAPS: 100.0000 mg | ORAL_CAPSULE | Freq: Two times a day (BID) | ORAL | Status: DC

## 2016-03-04 MED ORDER — CEPHALEXIN 500 MG PO CAPS
500.0000 mg | ORAL_CAPSULE | Freq: Once | ORAL | Status: AC
  Administered 2016-03-04: 500 mg via ORAL
  Filled 2016-03-04: qty 1

## 2016-03-04 NOTE — ED Notes (Signed)
Pt ambulatory to waiting room. Pt verbalized understanding of discharge information.

## 2016-03-05 LAB — URINE CULTURE: CULTURE: NO GROWTH

## 2016-03-10 ENCOUNTER — Emergency Department (HOSPITAL_COMMUNITY): Payer: Medicare Other

## 2016-03-10 ENCOUNTER — Emergency Department (HOSPITAL_COMMUNITY)
Admission: EM | Admit: 2016-03-10 | Discharge: 2016-03-10 | Disposition: A | Discharge status: Home / Self Care | Payer: Medicare Other | Attending: Emergency Medicine | Admitting: Emergency Medicine

## 2016-03-10 ENCOUNTER — Encounter (HOSPITAL_COMMUNITY): Payer: Self-pay

## 2016-03-10 DIAGNOSIS — W182XXA Fall in (into) shower or empty bathtub, initial encounter: Secondary | ICD-10-CM | POA: Diagnosis not present

## 2016-03-10 DIAGNOSIS — S80212A Abrasion, left knee, initial encounter: Secondary | ICD-10-CM | POA: Insufficient documentation

## 2016-03-10 DIAGNOSIS — Z79899 Other long term (current) drug therapy: Secondary | ICD-10-CM | POA: Insufficient documentation

## 2016-03-10 DIAGNOSIS — S50312A Abrasion of left elbow, initial encounter: Secondary | ICD-10-CM | POA: Diagnosis not present

## 2016-03-10 DIAGNOSIS — Z87891 Personal history of nicotine dependence: Secondary | ICD-10-CM | POA: Insufficient documentation

## 2016-03-10 DIAGNOSIS — I10 Essential (primary) hypertension: Secondary | ICD-10-CM | POA: Diagnosis not present

## 2016-03-10 DIAGNOSIS — S0990XA Unspecified injury of head, initial encounter: Secondary | ICD-10-CM | POA: Insufficient documentation

## 2016-03-10 DIAGNOSIS — Y999 Unspecified external cause status: Secondary | ICD-10-CM | POA: Insufficient documentation

## 2016-03-10 DIAGNOSIS — Y929 Unspecified place or not applicable: Secondary | ICD-10-CM | POA: Insufficient documentation

## 2016-03-10 DIAGNOSIS — I2510 Atherosclerotic heart disease of native coronary artery without angina pectoris: Secondary | ICD-10-CM | POA: Diagnosis not present

## 2016-03-10 DIAGNOSIS — Z7901 Long term (current) use of anticoagulants: Secondary | ICD-10-CM | POA: Insufficient documentation

## 2016-03-10 DIAGNOSIS — Y939 Activity, unspecified: Secondary | ICD-10-CM | POA: Diagnosis not present

## 2016-03-10 DIAGNOSIS — W19XXXA Unspecified fall, initial encounter: Secondary | ICD-10-CM

## 2016-03-10 DIAGNOSIS — T07XXXA Unspecified multiple injuries, initial encounter: Secondary | ICD-10-CM

## 2016-03-10 DIAGNOSIS — S59902A Unspecified injury of left elbow, initial encounter: Secondary | ICD-10-CM | POA: Diagnosis present

## 2016-03-10 MED ORDER — ACETAMINOPHEN 500 MG PO TABS
500.0000 mg | ORAL_TABLET | Freq: Once | ORAL | Status: AC
  Administered 2016-03-10: 500 mg via ORAL
  Filled 2016-03-10: qty 1

## 2016-03-10 NOTE — ED Provider Notes (Signed)
TIME SEEN: 6:25 AM  CHIEF COMPLAINT: Fall, head injury  HPI: Pt is a 75 y.o. male with history of atrial fibrillation on Coumadin, bladder cancer not currently receiving treatment, coronary artery disease status post CABG, cardiomyopathy status post defibrillated/pacemaker placement who presents to the emergency department with complaints of a fall that occurred just prior to arrival. Reports that he was in the bathroom trying to empty his Foley catheter bag when he lost his footing and fell. He states he hit his head on the floor but did not lose consciousness. Also complaining of left elbow pain, left knee pain, right hip pain and diffuse lower back pain. Denies any palpitations, shortness of breath, chest pain or dizziness that led to his fall. No recent fevers, cough, vomiting or diarrhea. Has plans to follow-up with his urologist this week.  ROS: See HPI Constitutional: no fever  Eyes: no drainage  ENT: no runny nose   Cardiovascular:  no chest pain  Resp: no SOB  GI: no vomiting GU: no dysuria Integumentary: no rash  Allergy: no hives  Musculoskeletal: no leg swelling  Neurological: no slurred speech ROS otherwise negative  PAST MEDICAL HISTORY/PAST SURGICAL HISTORY:  Past Medical History:  Diagnosis Date  . A-fib (HCC)    a. chronic coumadin  . Anxiety   . Atrial flutter ([place])    a. 01/2011 s/p unsuccessful RFCA complicated by CHB req PPM.  . Bladder mass 11/17/2015   Urothelial carcinoma until proven otherwise.  [name] Kitchen BPH (benign prostatic hyperplasia)    a. elevated PSA  . CAD (coronary artery disease)    a. 1991 s/p CABGx4;  b. redo CABG in 2007 (SVG->OM, VG->PDA->PLV);  c. 01/2011 Cath: LM 80ost, LAD 100p, LCX 100ost, OM1 100, RCA 100, LIMA->LAD ok, VG->PDA ok, VG->OM ok;  d. 03/2012 MV: EF 32% inf & inflat scar w/o ischemia; e. Lexi MV: EF 33%, inf infarct, no ischemia. f. lexiscan 06/12/2014 RCA scar, no ischemia, EF 26%  . CHB (complete heart block) ([place])    a. 01/2011: in  setting of RFCA, s/p SJM 2110 Accent DC PPM, ser # [id number].  [name] Kitchen Chronic systolic CHF (congestive heart failure), NYHA class 2 ([place])    a. 06/2012 Echo: EF 25%, distal sept and apical AK, sev dil LV, mild LVH, mod dil LA.  . Depression    "since [id number]" (08/29/2012)  . Elevated PSA, less than 10 ng/ml   . Fibromyalgia   . GERD (gastroesophageal reflux disease)   . Hiatal hernia    a. 03/01/2013 EGD and esoph dil.  [name] Kitchen Hyperlipidemia   . Hypertension   . Hyponatremia 11/17/2015   Water intoxication versus SIADH.  . Ischemic cardiomyopathy    a. 06/2012 EF 25% by echo.  . Kidney cysts    a. bilateral  . Pacemaker    explanted 06/15/15 >>> CRT-D STJ device, Dr. Caryl Comes  . Panic attacks   . Skin cancer    'above left eyebrow; tip of my nose" (08/29/2012)    MEDICATIONS:  Prior to Admission medications   Medication Sig Start Date End Date Taking? Authorizing Provider  acetaminophen (TYLENOL) 500 MG tablet Take 500 mg by mouth every 8 (eight) hours as needed for mild pain.     Historical Provider, MD  ALPRAZolam Duanne Moron) 1 MG tablet Take 1 tablet (1 mg total) by mouth 3 (three) times daily as needed. For anxiety 12/21/15   Eustaquio Maize, MD  carvedilol (COREG) 6.25 MG tablet Take 1 tablet (6.25 mg total) by  mouth 2 (two) times daily. 11/06/15   Minus Breeding, MD  cephALEXin (KEFLEX) 500 MG capsule Take 1 capsule (500 mg total) by mouth 4 (four) times daily. 03/04/16   Merrily Pew, MD  doxepin (SINEQUAN) 25 MG capsule Take 25 mg (1 capsule) by mouth in the morning and 50 mg (2 capsules) by mouth at night. Patient taking differently: Take 25-50 mg by mouth 2 (two) times daily. Take 25 mg (1 capsule) by mouth in the morning and 50 mg (2 capsules) by mouth at night. 11/12/15   Eustaquio Maize, MD  fluticasone (FLONASE) 50 MCG/ACT nasal spray Place 2 sprays into both nostrils daily. 04/30/15   Sharion Balloon, FNP  furosemide (LASIX) 20 MG tablet Take 1 tablet (20 mg total) by mouth daily. 11/20/15    Rexene Alberts, MD  gabapentin (NEURONTIN) 100 MG capsule Take 100 mg by mouth 4 (four) times daily.    Historical Provider, MD  guaiFENesin (MUCINEX) 600 MG 12 hr tablet Take 600 mg by mouth 2 (two) times daily as needed for cough or to loosen phlegm. Reported on 11/12/2015    Historical Provider, MD  hydroxypropyl methylcellulose / hypromellose (ISOPTO TEARS / GONIOVISC) 2.5 % ophthalmic solution Place 1 drop into both eyes 3 (three) times daily as needed for dry eyes.    Historical Provider, MD  isosorbide mononitrate (IMDUR) 60 MG 24 hr tablet Take 1 tablet (60 mg total) by mouth daily. 11/06/15   Minus Breeding, MD  lisinopril (PRINIVIL,ZESTRIL) 5 MG tablet Take 1 tablet (5 mg total) by mouth every other day. 11/13/15   Minus Breeding, MD  loratadine (CLARITIN) 10 MG tablet Take 10 mg by mouth daily.    Historical Provider, MD  Multiple Vitamins-Minerals (PRESERVISION AREDS 2) CAPS Take 2 capsules by mouth daily. Reported on 06/26/2015    Historical Provider, MD  nitroGLYCERIN (NITROSTAT) 0.4 MG SL tablet DISSOLVE ONE TABLET UNDER THE TONGUE EVERY 5 MINUTES AS NEEDED FOR CHEST PAIN.  DO NOT EXCEED A TOTAL OF 3 DOSES IN 15 MINUTES 05/11/15   Darlin Coco, MD  omeprazole (PRILOSEC) 20 MG capsule Take 1 capsule (20 mg total) by mouth 2 (two) times daily. 03/25/13   Darlin Coco, MD  polyethylene glycol [facility] / GLYCOLAX) packet Take 17 g by mouth daily.     Historical Provider, MD  rosuvastatin (CRESTOR) 10 MG tablet Take 1 tablet (10 mg total) by mouth daily. 03/25/15   Darlin Coco, MD  warfarin (JANTOVEN) 5 MG tablet Take 5 mg by mouth every evening.    Historical Provider, MD    ALLERGIES:  Allergies  Allergen Reactions  . Hydrocodone-Acetaminophen Itching  . Prednisone Itching  . Sulfa Drugs Cross Reactors Other (See Comments)    unknown  . Ultram [Tramadol Hcl] Itching    "don't remember how bad"  . Xarelto [Rivaroxaban] Other (See Comments)    Itching   . Zithromax  [Azithromycin] Itching    Itching     SOCIAL HISTORY:  Social History  Substance Use Topics  . Smoking status: Former Smoker    Packs/day: 0.00    Years: 0.00    Types: Cigarettes    Quit date: 09/21/1973  . Smokeless tobacco: Never Used  . Alcohol use No    FAMILY HISTORY: Family History  Problem Relation Age of Onset  . Stroke Mother   . Heart attack Father     died @ 25, mult MI's.  [name] Kitchen Heart disease Father   . Hypertension Father   .  Diabetes Sister     EXAM: BP 113/70   Pulse 71   Temp 98.2 F (36.8 C)   Resp 18   Ht 5\' 11"  (1.803 m)   Wt 190 lb (86.2 kg)   SpO2 97%   BMI 26.50 kg/m  CONSTITUTIONAL: Alert and oriented and responds appropriately to questions. Well-appearing; well-nourished; GCS 15 HEAD: Normocephalic; atraumatic EYES: Conjunctivae clear, PERRL, EOMI ENT: normal nose; no rhinorrhea; moist mucous membranes; pharynx without lesions noted; no dental injury; no septal hematoma NECK: Supple, no meningismus, no LAD; no midline spinal tenderness, step-off or deformity; trachea midline CARD: RRR; S1 and S2 appreciated; no murmurs, no clicks, no rubs, no gallops RESP: Normal chest excursion without splinting or tachypnea; breath sounds clear and equal bilaterally; no wheezes, no rhonchi, no rales; no hypoxia or respiratory distress CHEST:  chest wall stable, no crepitus or ecchymosis or deformity, nontender to palpation; no flail chest ABD/GI: Normal bowel sounds; non-distended; soft, non-tender, no rebound, no guarding; no ecchymosis or other lesions noted PELVIS:  stable, nontender to palpation BACK:  The back appears normal and is only tender over the lumbar spine, there is no CVA tenderness; no midline step-off or deformity EXT: Tender over the left elbow and left knee with associated abrasions. Normal ROM in all joints; otherwise extreme bizarre non-tender to palpation; no edema; normal capillary refill; no cyanosis, no bony tenderness or bony deformity  of patient's extremities, no joint effusion, compartments are soft, extremities are warm and well-perfused, no ecchymosis or lacerations    SKIN: Normal color for age and race; warm NEURO: Moves all extremities equally, sensation to light touch intact diffusely, cranial nerves II through XII intact PSYCH: The patient's mood and manner are appropriate. Grooming and personal hygiene are appropriate.  MEDICAL DECISION MAKING: Patient here with a fall that appears to have been mechanical in nature. Will obtain imaging. No lacerations. Hemodynamically stable and neurologically intact. No recent infectious symptoms. No symptoms that led to his fall.  ED PROGRESS: Imaging shows no acute abnormality. Patient reports feeling better after Tylenol. He is a mandatory. Discussed head injury return precautions with patient and daughter at bedside. They verbalize understanding and are comfortable with this plan.   At this time, I do not feel there is any life-threatening condition present. I have reviewed and discussed all results (EKG, imaging, lab, urine as appropriate) and exam findings with patient/family. I have reviewed nursing notes and appropriate previous records.  I feel the patient is safe to be discharged home without further emergent workup and can continue workup as an outpatient as needed. Discussed usual and customary return precautions. Patient/family verbalize understanding and are comfortable with this plan.  Outpatient follow-up has been provided. All questions have been answered.      [place], DO 03/10/16 [phone]

## 2016-03-10 NOTE — ED Triage Notes (Signed)
Pt states he was getting up to go into the bathroom when he fell, states the back of his head hit the floor.  Pt denies loc, having pain to back of head and left elbow.  Pt also c/o pain to right lower back.

## 2016-03-11 ENCOUNTER — Ambulatory Visit (INDEPENDENT_AMBULATORY_CARE_PROVIDER_SITE_OTHER): Payer: Medicare Other | Admitting: Pharmacist Clinician (PhC)/ Clinical Pharmacy Specialist

## 2016-03-11 DIAGNOSIS — I4891 Unspecified atrial fibrillation: Secondary | ICD-10-CM

## 2016-03-11 DIAGNOSIS — I482 Chronic atrial fibrillation, unspecified: Secondary | ICD-10-CM

## 2016-03-11 LAB — COAGUCHEK XS/INR WAIVED
INR: 2.1 — ABNORMAL HIGH (ref 0.9–1.1)
Prothrombin Time: 25.7 s

## 2016-03-11 NOTE — Patient Instructions (Signed)
Anticoagulation Dose Instructions as of 03/11/2016      [name] Tue Wed Thu Fri Sat   New Dose 5 mg 5 mg 5 mg 5 mg 5 mg 5 mg 2.5 mg    Description   Continue taking warfarin as listed above.  INR today is 2.1

## 2016-03-15 ENCOUNTER — Ambulatory Visit (INDEPENDENT_AMBULATORY_CARE_PROVIDER_SITE_OTHER): Payer: Medicare Other | Admitting: *Deleted

## 2016-03-15 DIAGNOSIS — I255 Ischemic cardiomyopathy: Secondary | ICD-10-CM

## 2016-03-15 DIAGNOSIS — I5022 Chronic systolic (congestive) heart failure: Secondary | ICD-10-CM

## 2016-03-16 ENCOUNTER — Ambulatory Visit (INDEPENDENT_AMBULATORY_CARE_PROVIDER_SITE_OTHER): Payer: Medicare Other | Admitting: Pediatrics

## 2016-03-16 ENCOUNTER — Ambulatory Visit (HOSPITAL_COMMUNITY): Payer: Medicare Other

## 2016-03-16 ENCOUNTER — Encounter: Payer: Self-pay | Admitting: Pediatrics

## 2016-03-16 ENCOUNTER — Encounter (INDEPENDENT_AMBULATORY_CARE_PROVIDER_SITE_OTHER): Payer: Self-pay

## 2016-03-16 ENCOUNTER — Telehealth: Payer: Self-pay | Admitting: Cardiology

## 2016-03-16 VITALS — BP 117/76 | HR 71 | Temp 97.0°F | Ht 71.0 in | Wt 188.0 lb

## 2016-03-16 DIAGNOSIS — F419 Anxiety disorder, unspecified: Secondary | ICD-10-CM

## 2016-03-16 DIAGNOSIS — M25552 Pain in left hip: Secondary | ICD-10-CM

## 2016-03-16 DIAGNOSIS — C673 Malignant neoplasm of anterior wall of bladder: Secondary | ICD-10-CM | POA: Diagnosis not present

## 2016-03-16 DIAGNOSIS — M25551 Pain in right hip: Secondary | ICD-10-CM

## 2016-03-16 NOTE — Telephone Encounter (Signed)
Confirmed remote transmission w/ pt daughter.   

## 2016-03-16 NOTE — Progress Notes (Signed)
  Subjective:   Patient ID: [name], male    DOB: Nov 14, 1940, 75 y.o.   MRN: [id number] CC: Nasal Congestion and Headache  HPI: [name] is a 75 y.o. male presenting for Nasal Congestion and Headache  Started a couple of days ago No fevers Normal appetite  Has had ongoing hip pain from recent fall Was seen in ED, neg CT neck, head Normal plain films Still feels banged up Using cane with walking due to some lingering L hip pain Can weight bear  Anxiety remains fairly well controlled on doxepin and xanax Has tried multiple other agents in the past with side effects and poor control Has been on this regimen for years  Seeing new oncologist for bladder cancer  Relevant past medical, surgical, family and social history reviewed. Allergies and medications reviewed and updated. History  Smoking Status  . Former Smoker  . Packs/day: 0.00  . Years: 0.00  . Types: Cigarettes  . Quit date: 09/21/1973  Smokeless Tobacco  . Never Used   ROS: Per HPI   Objective:    BP 117/76   Pulse 71   Temp 97 F (36.1 C) (Oral)   Ht 5\' 11"  (1.803 m)   Wt 188 lb (85.3 kg)   BMI 26.22 kg/m   Wt Readings from Last 3 Encounters:  03/16/16 188 lb (85.3 kg)  03/10/16 190 lb (86.2 kg)  03/03/16 186 lb (84.4 kg)    Gen: NAD, alert, cooperative with exam, NCAT EYES: EOMI, no conjunctival injection, or no icterus CV: NRRR, normal S1/S2, no murmur, distal pulses 2+ b/l Resp: CTABL, no wheezes, normal WOB Abd: +BS, soft, NTND. no guarding or organomegaly Ext: No edema, warm Neuro: Alert and oriented MSK: no pain with ROM b/l hips, stable pelvis, no point tenderness over spine  Assessment & Plan:  [name] was seen today for nasal congestion and headache.  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) Pt  brings up that he is still comfortable with decision not to treat his bladder cancer He says he worries about the side effects of chemotherapy and feeling poorly Per chart review at last oncology appt he was again undecided about treatment Validated pt concerns Encouraged him to continue to talk with his family and oncologist if it helps with his decision No symptoms of urinary retention today  Anxiety Well controlled with xanax and doxepin Has been tried on multiple agents in past, has side effects with them and fairly good control of anxiety with current meds Feeling steady on his feet now Will continue to readdress need and dose of xanax in future as needed if gait stability is in question   Follow up plan: 3 mo Assunta Found, MD [place]

## 2016-03-18 NOTE — Progress Notes (Signed)
Remote ICD transmission.   

## 2016-03-21 ENCOUNTER — Encounter (HOSPITAL_COMMUNITY): Payer: Self-pay | Admitting: Hematology & Oncology

## 2016-03-21 ENCOUNTER — Encounter (HOSPITAL_COMMUNITY): Payer: Self-pay | Admitting: Lab

## 2016-03-21 ENCOUNTER — Encounter (HOSPITAL_COMMUNITY): Payer: Medicare Other | Attending: Hematology & Oncology | Admitting: Hematology & Oncology

## 2016-03-21 ENCOUNTER — Telehealth: Payer: Self-pay | Admitting: Pediatrics

## 2016-03-21 VITALS — BP 123/60 | HR 74 | Temp 97.7°F | Resp 16 | Wt 186.9 lb

## 2016-03-21 DIAGNOSIS — C7A1 Malignant poorly differentiated neuroendocrine tumors: Secondary | ICD-10-CM

## 2016-03-21 DIAGNOSIS — C679 Malignant neoplasm of bladder, unspecified: Secondary | ICD-10-CM | POA: Diagnosis not present

## 2016-03-21 DIAGNOSIS — I5022 Chronic systolic (congestive) heart failure: Secondary | ICD-10-CM

## 2016-03-21 DIAGNOSIS — Z7189 Other specified counseling: Secondary | ICD-10-CM

## 2016-03-21 DIAGNOSIS — I255 Ischemic cardiomyopathy: Secondary | ICD-10-CM

## 2016-03-21 NOTE — Telephone Encounter (Signed)
Patient reports he is still having headaches and soreness when he touches his head.  He is continuing to take Tylenol.  Wants to know what we recommend.  I advised patient that he would probably experience some headache and continued soreness for several days possibly into next week.  Patient would like to know if you think he needs anything else or any scans.  Please advise.

## 2016-03-21 NOTE — Progress Notes (Signed)
Met with pt face to face.  Introduced myself and explain a little bit about my role as the patient navigator.  Pt given a card with all my information on it.  Told pt to call if they had any questions or concerns.  Pt verbalized understanding.   Pt is being referred to hospice but I gave him my information in case they needed anything or someone to talk to.

## 2016-03-21 NOTE — Patient Instructions (Signed)
[place] at [facility] Discharge Instructions  RECOMMENDATIONS MADE BY THE CONSULTANT AND ANY TEST RESULTS WILL BE SENT TO YOUR REFERRING PHYSICIAN.  Exam with Dr. Pranay Hilbun Muse today.   We have referred you to [facility], they will contact you to come out to the house.   Please call us with any further concerns or questions.    Thank you for choosing [place] at [facility] to provide your oncology and hematology care.  To afford each patient quality time with our provider, please arrive at least 15 minutes before your scheduled appointment time.   Beginning January 23rd 2017 lab work for the [facility] will be done in the  Main lab at [facility] on 1st floor. If you have a lab appointment with the [facility] please come in thru the  Main Entrance and check in at the main information desk  You need to re-schedule your appointment should you arrive 10 or more minutes late.  We strive to give you quality time with our providers, and arriving late affects you and other patients whose appointments are after yours.  Also, if you no show three or more times for appointments you may be dismissed from the clinic at the providers discretion.     Again, thank you for choosing [facility].  Our hope is that these requests will decrease the amount of time that you wait before being seen by our physicians.       _____________________________________________________________  Should you have questions after your visit to [facility] please contact our office at [phone] between the hours of 8:30 a.m. and 4:30 p.m.  Voicemails left after 4:30 p.m. will not be returned until the following business day.  For prescription refill requests, have your pharmacy contact our office.         Resources For Cancer Patients and their Caregivers ? American Cancer Society: Can assist with transportation, wigs, general  needs, runs Look Good Feel Better.        856-816-1061 ? Cancer Care: Provides financial assistance, online support groups, medication/co-pay assistance.  1-800-813-HOPE (706)201-6182) ? Day Assists [place] cancer patients and their families through emotional , educational and financial support.  (702)856-0815 ? [place] DSS Where to apply for food stamps, Medicaid and utility assistance. 973-091-7158 ? [facility]: Transportation to medical appointments. (850) 639-4691 ? Social Security Administration: May apply for disability if have a Stage IV cancer. 512-615-5383 (201)262-6993 ? [employer], Disability and Transit Services: Assists with nutrition, care and transit needs. [place] Support Programs: @10RELATIVEDAYS @ > Cancer Support Group  2nd Tuesday of the month 1pm-2pm, Journey Room  > Creative Journey  3rd Tuesday of the month 1130am-1pm, Journey Room  > Look Good Feel Better  1st Wednesday of the month 10am-12 noon, Journey Room (Call [place] to register [phone])

## 2016-03-21 NOTE — Progress Notes (Unsigned)
Referral sent to Hospice. Records faxed on 11/20

## 2016-03-21 NOTE — Progress Notes (Signed)
[name] Kitchen    HEMATOLOGY/ONCOLOGY PROGRESS NOTE  Date of Service: 03/21/2016  Patient Care Team: Brett Maize, MD as PCP - General (Pediatrics) Dr. Tresa [name] urology Dr. 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 male who has been referred to [place] by Dr .Brett Maize, MD And Dr [name] for evaluation and continued management of newly diagnosed stage II poorly differentiated pT2NxMx, Grade 3, neuroendocrine bladder cancer.  Patient had presented with urinary retention on 11/16/2015 to the emergency room and had a CT of the abdomen and pelvis that showed a 4 cm bladder mass. He subsequently had a TURBT on 12/29/2015 that showed a poorly differentiated neuroendocrine bladder tumor invasive into the muscularis propria suggesting at least T2 status. He reports having intermittent hematuria from this accentuated by the fact that he is on Coumadin for atrial fibrillation.  He had staging CT of the chest abdomen pelvis on 01/21/2016 that did not show any overt evidence of metastatic disease.   Patient is accompanied by his daughter. He says that he is feeling good today.   Dr. Irene [name] ordered him a PET scan, but the patient says he cancelled it. He doesn't want the scan because he opted against any therapy.   He would like to go with Hospice. His wife's cousin was just in hospice, so she says that she knows what to expect. They would like a referral for Hospice.  He says that he has been very tired. If he goes out to the grocery store, he is wiped out for the rest of the day and the next day.  He says after his tumor was removed from his bladder, he felt great. He didn't even have to take a tylenol.   The patient is feeling good and is aiming for quality over quantity. He has been going and doing whatever he wants  to do before he dies. He said "If I wanna go to [place], I go to [place]". He says if it's warm, he gets out and goes.   He says that he has been experiencing headaches from a concussion he got from falling. He was seen in the ED on 11/9.  MEDICAL HISTORY:  Past Medical History:  Diagnosis Date  . A-fib (HCC)    a. chronic coumadin  . Anxiety   . Atrial flutter ([place])    a. 01/2011 s/p unsuccessful RFCA complicated by CHB req PPM.  . Bladder cancer ([place])   . Bladder mass 11/17/2015   Urothelial carcinoma until proven otherwise.  [name] Kitchen BPH (benign prostatic hyperplasia)    a. elevated PSA  . CAD (coronary artery disease)    a. 1991 s/p CABGx4;  b. redo CABG in 2007 (SVG->OM, VG->PDA->PLV);  c. 01/2011 Cath: LM 80ost, LAD 100p, LCX 100ost, OM1 100, RCA 100, LIMA->LAD ok, VG->PDA ok, VG->OM ok;  d. 03/2012 MV: EF 32% inf & inflat scar w/o ischemia; e. Lexi MV: EF 33%, inf infarct, no ischemia. f. lexiscan 06/12/2014 RCA scar, no ischemia, EF 26%  . CHB (complete heart block) ([place])    a. 01/2011: in setting of RFCA, s/p SJM 2110 Accent DC PPM, ser # [id number].  [name] Kitchen Chronic systolic CHF (congestive heart failure), NYHA class 2 ([place])    a. 06/2012 Echo: EF 25%, distal sept and apical AK, sev dil LV, mild LVH, mod dil LA.  [name] Kitchen  Depression    "since 1986-1987" (Sep 16, 2012)  . Elevated PSA, less than 10 ng/ml   . Fibromyalgia   . GERD (gastroesophageal reflux disease)   . Hiatal hernia    a. 03/01/2013 EGD and esoph dil.  [name] Kitchen Hyperlipidemia   . Hypertension   . Hyponatremia 11/17/2015   Water intoxication versus SIADH.  . Ischemic cardiomyopathy    a. 06/2012 EF 25% by echo.  . Kidney cysts    a. bilateral  . neuroendocrine   . Pacemaker    explanted 06/15/15 >>> CRT-D STJ device, Dr. Caryl Comes  . Panic attacks   . Skin cancer    'above left eyebrow; tip of my nose" (09/16/2012)    SURGICAL HISTORY: Past Surgical History:  Procedure Laterality Date  . ATRIAL FLUTTER ABLATION  01/2011   "didn't work"  (09/16/12)  . CARDIAC CATHETERIZATION  10/30/2009   Grafts patent. Mild to moderate LV dysfunction. EF 45%. Managed medically.   [name] Kitchen CARDIAC CATHETERIZATION     multiple  . CARDIAC CATHETERIZATION N/A 04/28/2015   Procedure: Left Heart Cath and Cors/Grafts Angiography;  Surgeon: Belva Crome, MD;  Location: [facility];  Service: Cardiovascular;  Laterality: N/A;  . CARDIOVERSION  05/25/2012   Procedure: CARDIOVERSION;  Surgeon: Darlin Coco, MD;  Location: [facility];  Service: Cardiovascular;  Laterality: N/A;  . CATARACT EXTRACTION W/ INTRAOCULAR LENS  IMPLANT, BILATERAL  2012  . CHOLECYSTECTOMY  2007  . CORONARY ARTERY BYPASS GRAFT  08/27/1989   CABG X 5  . CORONARY ARTERY BYPASS GRAFT  06/2005   CABG X 3  . EP IMPLANTABLE DEVICE N/A 06/15/2015   STJ, CRT-D, Dr. Caryl Comes  . EXCISIONAL HEMORRHOIDECTOMY  ~ 1980  . [place]   "growth on the bone taken off; not cancer" (09-16-12)  . INGUINAL HERNIA REPAIR  1960's   "left I think"  . INGUINAL HERNIA REPAIR Right 10/12/2015   Procedure: RIGHT INGUINAL HERNIA REPAIR WITH MESH;  Surgeon: Jackolyn Confer, MD;  Location: [facility];  Service: General;  Laterality: Right;  . INSERT / REPLACE / REMOVE PACEMAKER  01/2011   initial placement  . INSERTION OF MESH Right 10/12/2015   Procedure: INSERTION OF MESH, RIGHT INGUINAL HERNIA;  Surgeon: Jackolyn Confer, MD;  Location: [facility];  Service: General;  Laterality: Right;  . SKIN CANCER EXCISION     'above left eyebrow; tip of my nose" (Sep 16, 2012)  . TONSILLECTOMY  1960    SOCIAL HISTORY: Social History   Social History  . Marital status: Widowed    Spouse name: N/A  . Number of children: 2  . Years of education: 12   Occupational History  . Retired    Social History Main Topics  . Smoking status: Former Smoker    Packs/day: 0.00    Years: 0.00    Types: Cigarettes    Quit date: 09/21/1973  . Smokeless tobacco: Never Used  . Alcohol use No  . Drug use: No  .  Sexual activity: No   Other Topics Concern  . Not on file   Social History Narrative   Wife passed away in Sep 17, 2011. Lives in [place] with his daughter and her family.  Retired from [employer].    FAMILY HISTORY: Family History  Problem Relation Age of Onset  . Stroke Mother   . Heart attack Father     died @ 52, mult MI's.  [name] Kitchen Heart disease Father   . Hypertension Father   . Diabetes Sister  ALLERGIES:  is allergic to hydrocodone-acetaminophen; prednisone; sulfa drugs cross reactors; ultram [tramadol hcl]; xarelto [rivaroxaban]; and zithromax [azithromycin].  MEDICATIONS:  Current Outpatient Prescriptions  Medication Sig Dispense Refill  . acetaminophen (TYLENOL) 500 MG tablet Take 500 mg by mouth every 8 (eight) hours as needed for mild pain.     [name] Kitchen ALPRAZolam (XANAX) 1 MG tablet Take 1 tablet (1 mg total) by mouth 3 (three) times daily as needed. For anxiety 270 tablet 1  . carvedilol (COREG) 6.25 MG tablet Take 1 tablet (6.25 mg total) by mouth 2 (two) times daily. 180 tablet 2  . cephALEXin (KEFLEX) 500 MG capsule Take 1 capsule (500 mg total) by mouth 4 (four) times daily. 20 capsule 0  . doxepin (SINEQUAN) 25 MG capsule Take 25 mg (1 capsule) by mouth in the morning and 50 mg (2 capsules) by mouth at night. (Patient taking differently: Take 25-50 mg by mouth 2 (two) times daily. Take 25 mg (1 capsule) by mouth in the morning and 50 mg (2 capsules) by mouth at night.) 270 capsule 3  . fluticasone (FLONASE) 50 MCG/ACT nasal spray Place 2 sprays into both nostrils daily. 16 g 6  . furosemide (LASIX) 20 MG tablet Take 1 tablet (20 mg total) by mouth daily. 30 tablet 1  . gabapentin (NEURONTIN) 100 MG capsule Take 100 mg by mouth 4 (four) times daily.    [name] Kitchen guaiFENesin (MUCINEX) 600 MG 12 hr tablet Take 600 mg by mouth 2 (two) times daily as needed for cough or to loosen phlegm. Reported on 11/12/2015    . hydroxypropyl methylcellulose / hypromellose (ISOPTO TEARS /  GONIOVISC) 2.5 % ophthalmic solution Place 1 drop into both eyes 3 (three) times daily as needed for dry eyes.    . isosorbide mononitrate (IMDUR) 60 MG 24 hr tablet Take 1 tablet (60 mg total) by mouth daily. 90 tablet 2  . lisinopril (PRINIVIL,ZESTRIL) 5 MG tablet Take 1 tablet (5 mg total) by mouth every other day. 90 tablet 3  . loratadine (CLARITIN) 10 MG tablet Take 10 mg by mouth daily.    . Multiple Vitamins-Minerals (PRESERVISION AREDS 2) CAPS Take 2 capsules by mouth daily. Reported on 06/26/2015    . nitroGLYCERIN (NITROSTAT) 0.4 MG SL tablet DISSOLVE ONE TABLET UNDER THE TONGUE EVERY 5 MINUTES AS NEEDED FOR CHEST PAIN.  DO NOT EXCEED A TOTAL OF 3 DOSES IN 15 MINUTES 100 tablet 3  . omeprazole (PRILOSEC) 20 MG capsule Take 1 capsule (20 mg total) by mouth 2 (two) times daily. 180 capsule 3  . polyethylene glycol (MIRALAX / GLYCOLAX) packet Take 17 g by mouth daily.     . rosuvastatin (CRESTOR) 10 MG tablet Take 1 tablet (10 mg total) by mouth daily. 90 tablet 3  . warfarin (JANTOVEN) 5 MG tablet Take 5 mg by mouth every evening.     No current facility-administered medications for this visit.     REVIEW OF SYSTEMS:   Review of Systems  Constitutional: Positive for malaise/fatigue.  HENT: Negative.   Eyes: Negative.   Respiratory: Negative.   Cardiovascular: Negative.   Gastrointestinal: Negative.   Genitourinary: Negative.   Musculoskeletal: Negative.   Skin: Negative.   Neurological: Positive for headaches (from concussion).  [name]/Heme/Allergies: Negative.   Psychiatric/Behavioral: Negative.   All other systems reviewed and are negative.  10 Point review of Systems was done is negative except as noted above.  PHYSICAL EXAMINATION: ECOG PERFORMANCE STATUS: 2 - Symptomatic, <50% confined to bed  .  Vitals:   03/21/16 0858  BP: 123/60  Pulse: 74  Resp: 16  Temp: 97.7 F (36.5 C)   Filed Weights   03/21/16 0858  Weight: 186 lb 14.4 oz (84.8 kg)   .Body mass index  is 26.07 kg/m.   Physical Exam  Constitutional: He is oriented to person, place, and time and well-developed, well-nourished, and in no distress.  Patient was able to get on exam table without assistance.   HENT:  Head: Normocephalic and atraumatic.  Mouth/Throat: Oropharynx is clear and moist.  Eyes: Conjunctivae and EOM are normal. Pupils are equal, round, and reactive to light.  Neck: Normal range of motion. Neck supple.  Cardiovascular: Normal rate, regular rhythm and normal heart sounds.   Pulmonary/Chest: Effort normal and breath sounds normal.  Abdominal: Soft. Bowel sounds are normal.  Musculoskeletal: Normal range of motion.  Neurological: He is alert and oriented to person, place, and time. Gait normal.  Skin: Skin is warm and dry.  Nursing note and vitals reviewed.   LABORATORY DATA:  I have reviewed the data as listed  . CBC Latest Ref Rng & Units 03/03/2016 03/02/2016 11/20/2015  WBC 4.0 - 10.5 K/uL 5.3 6.8 5.1  Hemoglobin 13.0 - 17.0 g/dL 12.3(L) 14.2 11.8(L)  Hematocrit 39.0 - 52.0 % 37.7(L) 42.9 36.4(L)  Platelets 150 - 400 K/uL 131(L) 154 156    . CMP Latest Ref Rng & Units 03/03/2016 03/02/2016 02/03/2016  Glucose 65 - 99 mg/dL 95 100(H) 78  BUN 6 - 20 mg/dL 19 13 11   Creatinine 0.61 - 1.24 mg/dL 1.21 1.07 0.98  Sodium 135 - 145 mmol/L 128(L) 131(L) 137  Potassium 3.5 - 5.1 mmol/L 4.0 5.0 5.0  Chloride 101 - 111 mmol/L 95(L) 97(L) 96  CO2 22 - 32 mmol/L 27 30 27   Calcium 8.9 - 10.3 mg/dL 8.0(L) 9.0 8.9  Total Protein 6.5 - 8.1 g/dL - 7.3 -  Total Bilirubin 0.3 - 1.2 mg/dL - 1.2 -  Alkaline Phos 38 - 126 U/L - 99 -  AST 15 - 41 U/L - 23 -  ALT 17 - 63 U/L - 19 -   Surgical Pathology Tissue Exam8/29/2017 [facility] Specimen  Tissue - Bladder  Result Narrative    ACCESSION NUMBER:[id number] RECEIVED: 12/29/2015 ORDERING PHYSICIAN:RONALD LEE DAVIS III , MD PATIENT NAME:Callicott, Gaylen [name] SURGICAL PATHOLOGY  REPORT  FINAL PATHOLOGIC DIAGNOSIS MICROSCOPIC EXAMINATION AND DIAGNOSIS  TUMOR, BLADDER, EXCISION: Poorly-differentiated carcinoma with neuroendocrine features. Muscularis propria present and involved by carcinoma. See comment.  Comment: Immunohistochemical stains performed on block A3 show that the malignant cells are positive for synaptophysin and chromogranin and are negative for cytokeratin903 and p63. The staining pattern confirms the neuroendocrine nature of this lesion.   I have personally reviewed the slides and/or other related materials referenced, and have edited the report as part of my pathologic assessment and final interpretation.  Electronically Signed Out By: Santa Lighter , MD 01/05/2016 14:39:38  zts/avm  Specimen(s) Received  Tumor, bladder, excision     RADIOGRAPHIC STUDIES: I have personally reviewed the radiological images as listed and agreed with the findings in the report. Study Result   CLINICAL DATA:  [place] backwards this morning striking the head. Headache and right-sided neck pain.  EXAM: CT HEAD WITHOUT CONTRAST  CT CERVICAL SPINE WITHOUT CONTRAST  TECHNIQUE: Multidetector CT imaging of the head and cervical spine was performed following the standard protocol without intravenous contrast. Multiplanar CT image reconstructions of the cervical spine were  also generated.  COMPARISON:  01/22/2015  FINDINGS: CT HEAD FINDINGS  Brain: The brain does not show accelerated atrophy. No evidence of old or acute infarction, mass lesion, hemorrhage, hydrocephalus or extra-axial collection.  Vascular: There is atherosclerotic calcification of the major vessels at the base of the brain.  Skull: Normal  Sinuses/Orbits: Clear/normal  Other: None significant  CT CERVICAL SPINE FINDINGS  Alignment: Normal  Skull base and vertebrae: No fracture. Small chronic os odontoid EM which is not of any acute  relevance. Chronic facet osteoarthritis on the right at C2-3 and C4-5 and on the left at C3-4. No canal compromise. Foraminal encroachment by osteophytes on the left at C3-4.  Soft tissues and spinal canal: Negative  Upper chest: Negative  Other: None significant  IMPRESSION: Head CT:  Normal study for age.  No acute or traumatic finding.  Cervical spine CT: No acute or traumatic finding. Ordinary degenerative changes, particularly affecting the left facet at C3-4.   Electronically Signed   By: Nelson Chimes M.D.   On: 03/10/2016 07:34    Study Result   CLINICAL DATA:  Fall.  EXAM: LEFT ELBOW - COMPLETE 3+ VIEW  COMPARISON:  No recent prior .  FINDINGS: No acute bony or joint abnormality identified. No evidence of fracture or dislocation.  IMPRESSION: No acute abnormality.   Electronically Signed   By: Marcello Moores  Register   On: 03/10/2016 07:44    Study Result   CLINICAL DATA:  Fall.  EXAM: LEFT KNEE - COMPLETE 4+ VIEW  COMPARISON:  No recent .  FINDINGS: No acute bony or joint abnormality identified. No evidence of fracture or dislocation. 5 fill artery atherosclerotic vascular calcification. Surgical clips noted over the left knee.  IMPRESSION: 1.  Degenerative changes left knee.  No acute bony abnormality.  2. Popliteal artery atherosclerotic vascular disease.   Electronically Signed   By: Marcello Moores  Register   On: 03/10/2016 07:57   Study Result   CLINICAL DATA:  Fall.  EXAM: LUMBAR SPINE - COMPLETE 4+ VIEW  COMPARISON:  12/23/2015.  FINDINGS: 3 mm anterolisthesis L4-L5. Diffuse mild degenerative change. No acute abnormality. Aortoiliac atherosclerotic vascular calcification.  IMPRESSION: 1. Diffuse multilevel degenerative change with 3 mm anterolisthesis L4-L5. No acute bony abnormality.  2. Aortoiliac atherosclerotic vascular disease.   Electronically Signed   By: Marcello Moores  Register   On: 03/10/2016  07:46      ASSESSMENT & PLAN:   75 year old Caucasian male with multiple medical comorbidities with ECOG performance status of 2 with  #1 stage II pT2NxMx, Grade 3, poorly differentiated neuroendocrine bladder cancer. Patient was previously evaluated at [facility] by Dr. Dorthula [name] and was offered carboplatin plus etoposide followed by radiation therapy. At that time he and his daughter had chosen to proceed with care closer to home and consider primarily a supportive cares and possible hospice involvement.  Plan He cancelled his PET/CT and has opted for no therapy for his neuroendocrine bladder cancer  He feels that his underlying cardiac disease would make him unable to tolerate any therapy. He has discussed this with family and his primary urologist.  End of life discussion/goals of care discussion was pursued today.  They would like a hospice consultation. I have referred them to [facility] per their request. I did have them meet with Anderson Malta in case they have questions moving forward.  NO refills were needed today.   #2 . Patient Active Problem List   Diagnosis Date Noted  .  High grade neuroendocrine carcinoma ([place]) 02/03/2016  . Malignant neoplasm of anterior wall of bladder ([place]) 02/03/2016  . Constipation 12/21/2015  . Elevated PSA 11/17/2015  . Chronic systolic congestive heart failure ([place]) 06/15/2015  . Bilateral calf pain 03/11/2015  . Neck pain 03/11/2015  . Claudication of gluteal region ([place]) 01/10/2014  . Ventral hernia 01/23/2013  . Angina pectoris ([place]) 08/30/2012  . Chronic anticoagulation   . Umbilical hernia [id number]  . CAD (coronary artery disease) 10/20/2011  . Atrial fibrillation-Permanent 05/31/2011  . Complete heart block ([place]) 05/31/2011  . Biventricular   defibrillator St Jude  05/31/2011  . Hx of CABG 09/23/2010  . Ischemic cardiomyopathy   . Hypertension   . Hyperlipidemia   . GERD (gastroesophageal  reflux disease)   . Anxiety   . Fibromyalgia   - Continue follow-up with primary care physician for management of other medical comorbidities.  All of the patients and his daughters questions were answered to their apparent satisfaction. The patient knows to call the clinic with any problems, questions or concerns.  This document serves as a record of services personally performed by Ancil Linsey, MD. It was created on her behalf by Martinique Casey, a trained medical scribe. The creation of this record is based on the scribe's personal observations and the provider's statements to them. This document has been checked and approved by the attending provider.  I have reviewed the above documentation for accuracy and completeness and I agree with the above.   This note was electronically signed.  Molli Hazard, MD  03/21/2016 9:16 AM

## 2016-03-23 NOTE — Telephone Encounter (Signed)
Called back to check on symptoms, left message, no answer.

## 2016-03-26 LAB — CUP PACEART REMOTE DEVICE CHECK
Battery Remaining Longevity: 72 mo
Battery Remaining Percentage: 86 %
Battery Voltage: 3.02 V
Date Time Interrogation Session: 20171114070018
HIGH POWER IMPEDANCE MEASURED VALUE: 65 Ohm
HIGH POWER IMPEDANCE MEASURED VALUE: 65 Ohm
Implantable Lead Implant Date: 20170213
Lead Channel Impedance Value: 530 Ohm
Lead Channel Pacing Threshold Amplitude: 1.125 V
Lead Channel Pacing Threshold Pulse Width: 0.5 ms
Lead Channel Sensing Intrinsic Amplitude: 12 mV
Lead Channel Setting Pacing Amplitude: 2.125
Lead Channel Setting Pacing Amplitude: 2.5 V
Lead Channel Setting Pacing Pulse Width: 0.5 ms
MDC IDC LEAD IMPLANT DT: 20120927
MDC IDC LEAD IMPLANT DT: 20170213
MDC IDC LEAD LOCATION: 753858
MDC IDC LEAD LOCATION: 753859
MDC IDC LEAD LOCATION: 753860
MDC IDC LEAD MODEL: 7122
MDC IDC MSMT LEADCHNL LV IMPEDANCE VALUE: 610 Ohm
MDC IDC MSMT LEADCHNL LV PACING THRESHOLD PULSEWIDTH: 0.8 ms
MDC IDC MSMT LEADCHNL RV PACING THRESHOLD AMPLITUDE: 0.75 V
MDC IDC PG IMPLANT DT: 20170213
MDC IDC PG SERIAL: 7335682
MDC IDC SET LEADCHNL LV PACING PULSEWIDTH: 0.8 ms
MDC IDC SET LEADCHNL RV SENSING SENSITIVITY: 2 mV

## 2016-03-29 ENCOUNTER — Ambulatory Visit (INDEPENDENT_AMBULATORY_CARE_PROVIDER_SITE_OTHER): Payer: Medicare Other | Admitting: Pharmacist

## 2016-03-29 DIAGNOSIS — I482 Chronic atrial fibrillation, unspecified: Secondary | ICD-10-CM

## 2016-03-29 DIAGNOSIS — I4891 Unspecified atrial fibrillation: Secondary | ICD-10-CM

## 2016-03-29 DIAGNOSIS — R319 Hematuria, unspecified: Secondary | ICD-10-CM

## 2016-03-29 LAB — COAGUCHEK XS/INR WAIVED
INR: 2.4 — ABNORMAL HIGH (ref 0.9–1.1)
Prothrombin Time: 28.7 s

## 2016-03-29 LAB — FINGERSTICK HEMOGLOBIN: Hemoglobin: 13.8 g/dL (ref 12.6–17.7)

## 2016-04-04 ENCOUNTER — Ambulatory Visit (INDEPENDENT_AMBULATORY_CARE_PROVIDER_SITE_OTHER): Payer: Medicare Other | Admitting: Pediatrics

## 2016-04-04 ENCOUNTER — Encounter: Payer: Self-pay | Admitting: Pediatrics

## 2016-04-04 VITALS — BP 119/75 | HR 74 | Temp 97.3°F | Ht 71.0 in | Wt 187.2 lb

## 2016-04-04 DIAGNOSIS — C7A1 Malignant poorly differentiated neuroendocrine tumors: Secondary | ICD-10-CM

## 2016-04-04 DIAGNOSIS — R51 Headache: Secondary | ICD-10-CM

## 2016-04-04 DIAGNOSIS — J069 Acute upper respiratory infection, unspecified: Secondary | ICD-10-CM

## 2016-04-04 DIAGNOSIS — Z7901 Long term (current) use of anticoagulants: Secondary | ICD-10-CM | POA: Diagnosis not present

## 2016-04-04 DIAGNOSIS — R519 Headache, unspecified: Secondary | ICD-10-CM

## 2016-04-04 NOTE — Progress Notes (Signed)
  Subjective:   Patient ID: [name], male    DOB: 22-Aug-1940, 75 y.o.   MRN: [id number] CC: Follow-up  HPI: [name] is a 75 y.o. male presenting for Follow-up  Headache: improved, continues to have headaches at times  URI symptoms: has some congestion Otherwise feeling well No fevers  Bladder cancer: Hospice has been out to house asymptomatic now  Afib: on warfarin A few days of hematuria last week, stopped No further symptoms  Relevant past medical, surgical, family and social history reviewed. Allergies and medications reviewed and updated. History  Smoking Status  . Former Smoker  . Packs/day: 0.00  . Years: 0.00  . Types: Cigarettes  . Quit date: 09/21/1973  Smokeless Tobacco  . Never Used   ROS: Per HPI   Objective:    BP 119/75   Pulse 74   Temp 97.3 F (36.3 C) (Oral)   Ht _0  (1.803 m)   Wt 187 lb 3.2 oz (84.9 kg)   BMI 26.11 kg/m   Wt Readings from Last 3 Encounters:  04/04/16 187 lb 3.2 oz (84.9 kg)  03/21/16 186 lb 14.4 oz (84.8 kg)  03/16/16 188 lb (85.3 kg)    Gen: NAD, alert, cooperative with exam, NCAT EYES: EOMI, no conjunctival injection, or no icterus ENT:  TMs pearly gray b/l, OP without erythema LYMPH: no cervical LAD CV: nl rate, normal S1/S2 Resp: CTABL, no wheezes, normal WOB Abd: +BS, soft, NTND. no guarding or organomegaly Ext: No edema, warm Neuro: Alert and oriented, strength equal b/l UE and LE, coordination grossly normal MSK: normal muscle bulk  Assessment & Plan:  Shahiem was seen today for follow-up multiple med problems  Diagnoses and all orders for this visit:  High grade neuroendocrine carcinoma [facility]) He has been pleased with the oncologist he was seeing Happy to have met the hospice team Hopeful that it will be some more months before he needs them  Acute URI Discussed symptomatic care Let me know if no improvement  Nonintractable headache, unspecified chronicity pattern, unspecified headache  type Improving Started after he hit his head a couple weeks ago Had CT Was normal  Chronic anticoagulation Discussed decreasing warfarin or coming off of warfarin  Pt wants to continue for stroke ppx with afib for now  Follow up plan: 2 mo Assunta Found, MD [place]

## 2016-04-06 ENCOUNTER — Other Ambulatory Visit: Payer: Self-pay

## 2016-04-06 ENCOUNTER — Ambulatory Visit: Payer: Medicare Other | Admitting: Pediatrics

## 2016-04-06 MED ORDER — ROSUVASTATIN CALCIUM 10 MG PO TABS: 10.0000 mg | ORAL_TABLET | Freq: Every day | ORAL | 2 refills | Status: DC

## 2016-04-06 NOTE — Telephone Encounter (Signed)
Rx(s) sent to pharmacy electronically.  

## 2016-04-17 ENCOUNTER — Encounter (HOSPITAL_COMMUNITY): Payer: Self-pay | Admitting: Emergency Medicine

## 2016-04-17 ENCOUNTER — Emergency Department (HOSPITAL_COMMUNITY)
Admission: EM | Admit: 2016-04-17 | Discharge: 2016-04-17 | Disposition: A | Discharge status: Home / Self Care | Payer: Medicare Other | Attending: Emergency Medicine | Admitting: Emergency Medicine

## 2016-04-17 ENCOUNTER — Emergency Department (HOSPITAL_COMMUNITY): Payer: Medicare Other

## 2016-04-17 DIAGNOSIS — X501XXA Overexertion from prolonged static or awkward postures, initial encounter: Secondary | ICD-10-CM | POA: Diagnosis not present

## 2016-04-17 DIAGNOSIS — Z87891 Personal history of nicotine dependence: Secondary | ICD-10-CM | POA: Diagnosis not present

## 2016-04-17 DIAGNOSIS — Z7901 Long term (current) use of anticoagulants: Secondary | ICD-10-CM | POA: Diagnosis not present

## 2016-04-17 DIAGNOSIS — I2510 Atherosclerotic heart disease of native coronary artery without angina pectoris: Secondary | ICD-10-CM | POA: Diagnosis not present

## 2016-04-17 DIAGNOSIS — Z8551 Personal history of malignant neoplasm of bladder: Secondary | ICD-10-CM | POA: Diagnosis not present

## 2016-04-17 DIAGNOSIS — I5022 Chronic systolic (congestive) heart failure: Secondary | ICD-10-CM | POA: Insufficient documentation

## 2016-04-17 DIAGNOSIS — S3992XA Unspecified injury of lower back, initial encounter: Secondary | ICD-10-CM | POA: Diagnosis present

## 2016-04-17 DIAGNOSIS — Z79899 Other long term (current) drug therapy: Secondary | ICD-10-CM | POA: Insufficient documentation

## 2016-04-17 DIAGNOSIS — Y929 Unspecified place or not applicable: Secondary | ICD-10-CM | POA: Diagnosis not present

## 2016-04-17 DIAGNOSIS — Z951 Presence of aortocoronary bypass graft: Secondary | ICD-10-CM | POA: Insufficient documentation

## 2016-04-17 DIAGNOSIS — Z950 Presence of cardiac pacemaker: Secondary | ICD-10-CM | POA: Insufficient documentation

## 2016-04-17 DIAGNOSIS — Y999 Unspecified external cause status: Secondary | ICD-10-CM | POA: Diagnosis not present

## 2016-04-17 DIAGNOSIS — Z85828 Personal history of other malignant neoplasm of skin: Secondary | ICD-10-CM | POA: Diagnosis not present

## 2016-04-17 DIAGNOSIS — M545 Low back pain, unspecified: Secondary | ICD-10-CM

## 2016-04-17 DIAGNOSIS — I110 Hypertensive heart disease with heart failure: Secondary | ICD-10-CM | POA: Insufficient documentation

## 2016-04-17 DIAGNOSIS — Y9389 Activity, other specified: Secondary | ICD-10-CM | POA: Diagnosis not present

## 2016-04-17 NOTE — ED Provider Notes (Signed)
[facility] DEPT Provider Note   CSN: [id number] Arrival date & time: 04/17/16  1055   By signing my name below, I, Brett [name], attest that this documentation has been prepared under the direction and in the presence of Jony Ladnier K. Satish Hammers, PA-C. Electronically Signed: Hansel [name], ED Scribe. 04/17/16. 11:33 AM.    History   Chief Complaint Chief Complaint  Patient presents with  . Back Pain    HPI Brett [name] is a 75 y.o. male with h/o bladder cancer (no current tx) who presents to the Emergency Department complaining of moderate lower back pain with radiation up the back that began last night immediately after a twisting motion. Pt denies recent trauma, heavy lifting, falls. Pt is ambulatory with minimal difficulty. Pt states his pain is worsened with movement. He states he took Tylenol PTA with some relief. He denies abdominal pain.   The history is provided by the patient. No language interpreter was used.    Past Medical History:  Diagnosis Date  . A-fib (HCC)    a. chronic coumadin  . Anxiety   . Atrial flutter ([place])    a. 01/2011 s/p unsuccessful RFCA complicated by CHB req PPM.  . Bladder cancer ([place])   . Bladder mass 11/17/2015   Urothelial carcinoma until proven otherwise.  Brett [name] BPH (benign prostatic hyperplasia)    a. elevated PSA  . CAD (coronary artery disease)    a. 1991 s/p CABGx4;  b. redo CABG in 2007 (SVG->OM, VG->PDA->PLV);  c. 01/2011 Cath: LM 80ost, LAD 100p, LCX 100ost, OM1 100, RCA 100, LIMA->LAD ok, VG->PDA ok, VG->OM ok;  d. 03/2012 MV: EF 32% inf & inflat scar w/o ischemia; e. Lexi MV: EF 33%, inf infarct, no ischemia. f. lexiscan 06/12/2014 RCA scar, no ischemia, EF 26%  . CHB (complete heart block) ([place])    a. 01/2011: in setting of RFCA, s/p SJM 2110 Accent DC PPM, ser # [id number].  Brett [name] Chronic systolic CHF (congestive heart failure), NYHA class 2 ([place])    a. 06/2012 Echo: EF 25%, distal sept and apical AK, sev dil LV, mild LVH, mod dil LA.  . Depression     "since [id number]" (08/29/2012)  . Elevated PSA, less than 10 ng/ml   . Fibromyalgia   . GERD (gastroesophageal reflux disease)   . Hiatal hernia    a. 03/01/2013 EGD and esoph dil.  Brett [name] Hyperlipidemia   . Hypertension   . Hyponatremia 11/17/2015   Water intoxication versus SIADH.  . Ischemic cardiomyopathy    a. 06/2012 EF 25% by echo.  . Kidney cysts    a. bilateral  . neuroendocrine   . Pacemaker    explanted 06/15/15 >>> CRT-D STJ device, Dr. Caryl Comes  . Panic attacks   . Skin cancer    'above left eyebrow; tip of my nose" (08/29/2012)    Patient Active Problem List   Diagnosis Date Noted  . 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 Malignant neoplasm of anterior wall of bladder ([place]) 02/03/2016  . 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 Constipation 12/21/2015  . Elevated PSA 11/17/2015  . Chronic systolic congestive heart failure ([place]) 06/15/2015  . Bilateral calf pain 03/11/2015  . Neck pain 03/11/2015  . Benign non-nodular prostatic hyperplasia with lower urinary tract symptoms 05/12/2014  . Claudication of gluteal region ([place]) 01/10/2014  . Ventral hernia 01/23/2013  . Angina pectoris ([place]) 08/30/2012  . Chronic anticoagulation   . Umbilical hernia [id number]  . CAD (coronary  artery disease) 10/20/2011  . Atrial fibrillation-Permanent 05/31/2011  . Complete heart block ([place]) 05/31/2011  . Biventricular   defibrillator St Jude  05/31/2011  . Hx of CABG 09/23/2010  . Ischemic cardiomyopathy   . Hypertension   . Hyperlipidemia   . GERD (gastroesophageal reflux disease)   . Anxiety   . Fibromyalgia     Past Surgical History:  Procedure Laterality Date  . ATRIAL FLUTTER ABLATION  01/2011   "didn't work" (08/29/2012)  . CARDIAC CATHETERIZATION  10/30/2009   Grafts patent. Mild to moderate LV dysfunction. EF 45%. Managed medically.   Brett [name] CARDIAC CATHETERIZATION     multiple  . CARDIAC CATHETERIZATION N/A 04/28/2015   Procedure: Left  Heart Cath and Cors/Grafts Angiography;  Surgeon: Belva Crome, MD;  Location: [facility];  Service: Cardiovascular;  Laterality: N/A;  . CARDIOVERSION  05/25/2012   Procedure: CARDIOVERSION;  Surgeon: Darlin Coco, MD;  Location: [facility];  Service: Cardiovascular;  Laterality: N/A;  . CATARACT EXTRACTION W/ INTRAOCULAR LENS  IMPLANT, BILATERAL  2012  . CHOLECYSTECTOMY  2007  . CORONARY ARTERY BYPASS GRAFT  08/27/1989   CABG X 5  . CORONARY ARTERY BYPASS GRAFT  06/2005   CABG X 3  . EP IMPLANTABLE DEVICE N/A 06/15/2015   STJ, CRT-D, Dr. Caryl Comes  . EXCISIONAL HEMORRHOIDECTOMY  ~ 1980  . [place]   "growth on the bone taken off; not cancer" (08/29/2012)  . INGUINAL HERNIA REPAIR  1960's   "left I think"  . INGUINAL HERNIA REPAIR Right 10/12/2015   Procedure: RIGHT INGUINAL HERNIA REPAIR WITH MESH;  Surgeon: Jackolyn Confer, MD;  Location: [facility];  Service: General;  Laterality: Right;  . INSERT / REPLACE / REMOVE PACEMAKER  01/2011   initial placement  . INSERTION OF MESH Right 10/12/2015   Procedure: INSERTION OF MESH, RIGHT INGUINAL HERNIA;  Surgeon: Jackolyn Confer, MD;  Location: [facility];  Service: General;  Laterality: Right;  . SKIN CANCER EXCISION     'above left eyebrow; tip of my nose" (08/29/2012)  . TONSILLECTOMY  1960       Home Medications    Prior to Admission medications   Medication Sig Start Date End Date Taking? Authorizing Provider  acetaminophen (TYLENOL) 500 MG tablet Take 500 mg by mouth every 8 (eight) hours as needed for mild pain.     Historical Provider, MD  ALPRAZolam Duanne Moron) 1 MG tablet Take 1 tablet (1 mg total) by mouth 3 (three) times daily as needed. For anxiety 12/21/15   Eustaquio Maize, MD  carvedilol (COREG) 6.25 MG tablet Take 1 tablet (6.25 mg total) by mouth 2 (two) times daily. 11/06/15   Minus Breeding, MD  doxepin (SINEQUAN) 25 MG capsule Take 25 mg (1 capsule) by mouth in the morning and 50 mg (2 capsules) by mouth at  night. Patient taking differently: Take 25-50 mg by mouth 2 (two) times daily. Take 25 mg (1 capsule) by mouth in the morning and 50 mg (2 capsules) by mouth at night. 11/12/15   Eustaquio Maize, MD  fluticasone (FLONASE) 50 MCG/ACT nasal spray Place 2 sprays into both nostrils daily. 04/30/15   Sharion Balloon, FNP  furosemide (LASIX) 20 MG tablet Take 1 tablet (20 mg total) by mouth daily. 11/20/15   Rexene Alberts, MD  gabapentin (NEURONTIN) 100 MG capsule Take 100 mg by mouth 4 (four) times daily.    Historical Provider, MD  guaiFENesin (MUCINEX) 600 MG 12 hr tablet Take  600 mg by mouth 2 (two) times daily as needed for cough or to loosen phlegm. Reported on 11/12/2015    Historical Provider, MD  hydroxypropyl methylcellulose / hypromellose (ISOPTO TEARS / GONIOVISC) 2.5 % ophthalmic solution Place 1 drop into both eyes 3 (three) times daily as needed for dry eyes.    Historical Provider, MD  isosorbide mononitrate (IMDUR) 60 MG 24 hr tablet Take 1 tablet (60 mg total) by mouth daily. 11/06/15   Minus Breeding, MD  lisinopril (PRINIVIL,ZESTRIL) 5 MG tablet Take 1 tablet (5 mg total) by mouth every other day. 11/13/15   Minus Breeding, MD  loratadine (CLARITIN) 10 MG tablet Take 10 mg by mouth daily.    Historical Provider, MD  Multiple Vitamins-Minerals (PRESERVISION AREDS 2) CAPS Take 2 capsules by mouth daily. Reported on 06/26/2015    Historical Provider, MD  nitroGLYCERIN (NITROSTAT) 0.4 MG SL tablet DISSOLVE ONE TABLET UNDER THE TONGUE EVERY 5 MINUTES AS NEEDED FOR CHEST PAIN.  DO NOT EXCEED A TOTAL OF 3 DOSES IN 15 MINUTES 05/11/15   Darlin Coco, MD  omeprazole (PRILOSEC) 20 MG capsule Take 1 capsule (20 mg total) by mouth 2 (two) times daily. 03/25/13   Darlin Coco, MD  polyethylene glycol [facility] / GLYCOLAX) packet Take 17 g by mouth daily.     Historical Provider, MD  rosuvastatin (CRESTOR) 10 MG tablet Take 1 tablet (10 mg total) by mouth daily. 04/06/16   Minus Breeding, MD  warfarin  (JANTOVEN) 5 MG tablet Take 5 mg by mouth every evening.    Historical Provider, MD    Family History Family History  Problem Relation Age of Onset  . Stroke Mother   . Heart attack Father     died @ 23, mult MI's.  Brett [name] Heart disease Father   . Hypertension Father   . Diabetes Sister     Social History Social History  Substance Use Topics  . Smoking status: Former Smoker    Packs/day: 0.00    Years: 0.00    Types: Cigarettes    Quit date: 09/21/1973  . Smokeless tobacco: Never Used  . Alcohol use No     Allergies   Hydrocodone-acetaminophen; Prednisone; Sulfa drugs cross reactors; Ultram [tramadol hcl]; Xarelto [rivaroxaban]; and Zithromax [azithromycin]   Review of Systems Review of Systems  Gastrointestinal: Negative for abdominal pain.  Musculoskeletal: Positive for back pain.  All other systems reviewed and are negative.    Physical Exam Updated Vital Signs BP 123/68 (BP Location: Left Arm)   Pulse 71   Temp 98 F (36.7 C) (Oral)   Resp 18   Ht 5\' 11"  (1.803 m)   Wt 187 lb (84.8 kg)   SpO2 100%   BMI 26.08 kg/m   Physical Exam  Constitutional: He appears well-developed and well-nourished.  HENT:  Head: Normocephalic.  Eyes: Conjunctivae are normal.  Cardiovascular: Normal rate.   Pulmonary/Chest: Effort normal. No respiratory distress.  Abdominal: He exhibits no distension.  Musculoskeletal: Normal range of motion. He exhibits tenderness.  Diffusely tender along the lumbar spine.   Neurological: He is alert.  Skin: Skin is warm and dry.  Psychiatric: He has a normal mood and affect. His behavior is normal.  Nursing note and vitals reviewed.    ED Treatments / Results   DIAGNOSTIC STUDIES: Oxygen Saturation is 100% on RA, normal by my interpretation.    COORDINATION OF CARE: 11:31 AM Discussed treatment plan with pt at bedside which includes XR and pt agreed to plan.  Labs (all labs ordered are listed, but only abnormal results are  displayed) Labs Reviewed - No data to display  EKG  EKG Interpretation None       Radiology Dg Lumbar Spine Complete  Result Date: 04/17/2016 CLINICAL DATA:  Low back and left leg pain.  Twisting injury. EXAM: LUMBAR SPINE - COMPLETE 4+ VIEW COMPARISON:  03/10/2016. FINDINGS: Mild anterior and lateral spur formation at multiple levels. No fractures, pars defects or subluxations. Extensive arterial calcifications, including the abdominal aorta. Cholecystectomy clips. Median sternotomy wires. IMPRESSION: 1. No acute abnormality. 2. Mild degenerative changes. 3. Aortic atherosclerosis. Electronically Signed   By: Claudie Revering M.D.   On: 04/17/2016 12:03    Procedures Procedures (including critical care time)  Medications Ordered in ED Medications - No data to display   Initial Impression / Assessment and Plan / ED Course  I have reviewed the triage vital signs and the nursing notes.  Pertinent labs & imaging results that were available during my care of the patient were reviewed by me and considered in my medical decision making (see chart for details).  Clinical Course     Pt counseled on results.  He has pain medication at home if needed.  Pt thinks tylenol will be enough.  Pt to follow up with his primary Md for recheck  Final Clinical Impressions(s) / ED Diagnoses   Final diagnoses:  Acute low back pain without sciatica, unspecified back pain laterality    New Prescriptions New Prescriptions   No medications on file    An After Visit Summary was printed and given to the patient.   Hollace Kinnier Nikolski, PA-C 04/17/16 [place], MD 04/17/16 [phone]

## 2016-04-17 NOTE — ED Triage Notes (Signed)
Pt reports he "twisted or injured my back somehow." Pt states he hurts from his neck all the way down. Pt c/o radiation down his L leg.

## 2016-04-29 ENCOUNTER — Ambulatory Visit (INDEPENDENT_AMBULATORY_CARE_PROVIDER_SITE_OTHER): Payer: Medicare Other | Admitting: Pediatrics

## 2016-04-29 ENCOUNTER — Encounter: Payer: Self-pay | Admitting: Pediatrics

## 2016-04-29 VITALS — BP 114/68 | HR 69 | Temp 96.9°F | Ht 71.0 in | Wt 193.0 lb

## 2016-04-29 DIAGNOSIS — J329 Chronic sinusitis, unspecified: Secondary | ICD-10-CM

## 2016-04-29 DIAGNOSIS — R6889 Other general symptoms and signs: Secondary | ICD-10-CM

## 2016-04-29 DIAGNOSIS — Z7901 Long term (current) use of anticoagulants: Secondary | ICD-10-CM | POA: Diagnosis not present

## 2016-04-29 DIAGNOSIS — I4891 Unspecified atrial fibrillation: Secondary | ICD-10-CM

## 2016-04-29 LAB — COAGUCHEK XS/INR WAIVED
INR: 2.4 — ABNORMAL HIGH (ref 0.9–1.1)
Prothrombin Time: 29.1 s

## 2016-04-29 LAB — VERITOR FLU A/B WAIVED
INFLUENZA A: NEGATIVE
INFLUENZA B: NEGATIVE

## 2016-04-29 MED ORDER — DOXYCYCLINE HYCLATE 100 MG PO TABS: 100.0000 mg | ORAL_TABLET | Freq: Two times a day (BID) | ORAL | 0 refills | Status: DC

## 2016-04-29 NOTE — Progress Notes (Signed)
  Subjective:   Patient ID: [name], male    DOB: October 16, 1940, 75 y.o.   MRN: [id number] CC: Hoarse; Sore Throat; Generalized Body Aches; and Fever  HPI: [name] is a 75 y.o. male presenting for Hoarse; Sore Throat; Generalized Body Aches; and Fever  No trouble with urination, emptying bladder No hematuria Feeling tired Hospice has been coming out about once a week Started getting sick two days ago Using flonase Taking mucinex Coughing a lot  Relevant past medical, surgical, family and social history reviewed. Allergies and medications reviewed and updated. History  Smoking Status  . Former Smoker  . Packs/day: 0.00  . Years: 0.00  . Types: Cigarettes  . Quit date: 09/21/1973  Smokeless Tobacco  . Never Used   ROS: Per HPI   Objective:    BP 114/68   Pulse 69   Temp (!) 96.9 F (36.1 C) (Oral)   Ht 5\' 11"  (1.803 m)   Wt 193 lb (87.5 kg)   BMI 26.92 kg/m   Wt Readings from Last 3 Encounters:  04/29/16 193 lb (87.5 kg)  04/17/16 187 lb (84.8 kg)  04/04/16 187 lb 3.2 oz (84.9 kg)    Gen: NAD, alert, cooperative with exam, NCAT EYES: EOMI, no conjunctival injection, or no icterus ENT:  TMs dull, effusion b/l, minimal redness, OP without erythema LYMPH: no cervical LAD CV: NRRR, normal S1/S2, no murmur, distal pulses 2+ b/l Resp: CTABL, no wheezes, normal WOB Abd: +BS, soft, NTND. no guarding or organomegaly Neuro: Alert and oriented MSK: normal muscle bulk  Assessment & Plan:  [name] was seen today for hoarse, sore throat, generalized body aches and fever.  Diagnoses and all orders for this visit:  Flu-like symptoms Flu negative -     Veritor Flu A/B Waived  Sinusitis, unspecified chronicity, unspecified location Discussed symptomatic care. Likely viral OK to wait to start abx If he does start should hold coumadin first two days then every other day while taking abx Pt able to repeat back plan, written down as well, will call with  questions Restart nl dose afterwards -     doxycycline (VIBRA-TABS) 100 MG tablet; Take 1 tablet (100 mg total) by mouth 2 (two) times daily.  Chronic anticoagulation On for afib -     CoaguChek XS/INR Waived  Follow up plan: 3 mo, sooner if needed Assunta Found, MD [place]

## 2016-05-03 ENCOUNTER — Encounter: Payer: Self-pay | Admitting: Family Medicine

## 2016-05-03 ENCOUNTER — Ambulatory Visit (INDEPENDENT_AMBULATORY_CARE_PROVIDER_SITE_OTHER): Payer: Medicare Other | Admitting: Family Medicine

## 2016-05-03 VITALS — BP 137/79 | HR 81 | Temp 96.7°F | Ht 71.0 in | Wt 192.4 lb

## 2016-05-03 DIAGNOSIS — R05 Cough: Secondary | ICD-10-CM

## 2016-05-03 DIAGNOSIS — R059 Cough, unspecified: Secondary | ICD-10-CM

## 2016-05-03 MED ORDER — BENZONATATE 100 MG PO CAPS: 100.0000 mg | ORAL_CAPSULE | Freq: Two times a day (BID) | ORAL | 0 refills | Status: DC | PRN

## 2016-05-03 NOTE — Progress Notes (Signed)
   HPI  Patient presents today with cough.  Patient states that he's been confused about his Coumadin dosing. He started doxycycline 3 days ago. He skips the first 2 days of Coumadin as directed but cannot remember if he took the pill yesterday.  States the cough is most severe at night. He requests additional cough medications if they're possible to take.  He denies any shortness of breath. He is tolerating foods and fluids normally. He has a normal appetite at this point.  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  PMH: Smoking status noted ROS: Per HPI  Objective: BP 137/79   Pulse 81   Temp (!) 96.7 F (35.9 C) (Oral)   Ht 5\' 11"  (1.803 m)   Wt 192 lb 6.4 oz (87.3 kg)   BMI 26.83 kg/m  Gen: NAD, alert, cooperative with exam HEENT: NCAT  CV: RRR, good S1/S2, no murmur Resp: CTABL, no wheezes, non-labored Ext: No edema, warm Neuro: Alert and oriented, No gross deficits  Assessment and plan:  # Cough Patient started antibiotics 3 days ago for sinusitis which appears to be improving, I have discussed with him in detail Coumadin dosing. He will take a dose today and Thursday, he'll return to his normal regimen on Saturday. Return in 1-2 weeks for repeat INR. Cough is limited to night-time, well covered by doxy Discussed Mucinex DM, Tessalon Perles as options for cough suppression. Patient has itching reaction to hydrocodone, avoiding Hycodan   Meds ordered this encounter  Medications  . benzonatate (TESSALON) 100 MG capsule    Sig: Take 1 capsule (100 mg total) by mouth 2 (two) times daily as needed for cough.    Dispense:  20 capsule    Refill:  0    Laroy Apple, MD [place] 05/03/2016, 10:43 AM

## 2016-05-03 NOTE — Patient Instructions (Addendum)
Great to see you!  From here, take coumadin 1 pill today, and Thursday. Return to your normal coumadin dosing Saturday, Come back in 1-2 weeks for a recheck of the INR.

## 2016-05-10 ENCOUNTER — Telehealth: Payer: Self-pay | Admitting: Pediatrics

## 2016-05-10 NOTE — Telephone Encounter (Signed)
done

## 2016-05-17 ENCOUNTER — Encounter: Payer: Self-pay | Admitting: Family Medicine

## 2016-05-17 ENCOUNTER — Ambulatory Visit: Admitting: Family Medicine

## 2016-05-17 ENCOUNTER — Encounter: Admitting: Pharmacist

## 2016-05-17 ENCOUNTER — Ambulatory Visit (INDEPENDENT_AMBULATORY_CARE_PROVIDER_SITE_OTHER): Payer: Medicare Other | Admitting: Family Medicine

## 2016-05-17 VITALS — BP 98/62 | HR 71 | Temp 97.3°F | Ht 71.0 in | Wt 186.0 lb

## 2016-05-17 DIAGNOSIS — Z7901 Long term (current) use of anticoagulants: Secondary | ICD-10-CM

## 2016-05-17 DIAGNOSIS — I482 Chronic atrial fibrillation, unspecified: Secondary | ICD-10-CM

## 2016-05-17 DIAGNOSIS — R05 Cough: Secondary | ICD-10-CM

## 2016-05-17 DIAGNOSIS — R059 Cough, unspecified: Secondary | ICD-10-CM

## 2016-05-17 LAB — COAGUCHEK XS/INR WAIVED
INR: 2 — ABNORMAL HIGH (ref 0.9–1.1)
Prothrombin Time: 24.5 s

## 2016-05-17 NOTE — Patient Instructions (Signed)
Great to see you!  Try nasocort ( may not bother your eyes), also consider a daily plain zyrtec. Your symptoms are most likely caused by post nasal drip.

## 2016-05-17 NOTE — Progress Notes (Signed)
   HPI  Patient presents today here with cough and cold symptoms.  Patient's lines over the last 3 days he's had nasal congestion, chest congestion and cough, and sore throat. He started using Flonase but had burning of the eyes so he stopped using it. He also needs an INR check.  Patient is tolerating food and fluids normally. He is on hospice for high-grade neuroendocrine carcinoma. He has history of CAD status post CABG.  Patient just finished a course of doxycycline with resolution of symptoms, this cough is milder than previous cough.  PMH: Smoking status noted ROS: Per HPI  Objective: BP 98/62   Pulse 71   Temp 97.3 F (36.3 C) (Oral)   Ht 5\' 11"  (1.803 m)   Wt 186 lb (84.4 kg)   BMI 25.94 kg/m  Gen: NAD, alert, cooperative with exam HEENT: NCAT oropharynx clear with a small amount of mucus in the oropharynx, nares clear, TMs normal but difficult to visualize with heavy cerumen burden CV: RRR, good S1/S2, no murmur Resp: CTABL, no wheezes, non-labored Ext: No edema, warm Neuro: Alert and oriented, No gross deficits  Assessment and plan:  # Cough Resolution with previous antibiotics, now with 3 days of mild cough most predominant symptom is sore throat and he has evidence of postnasal drip on exam. Recommended over-the-counter Zyrtec to help dry secretions Trial of Nasacort was not tolerating Flonase. Return to clinic as needed for any worsening symptoms.   Anticoagulation for A. fib INR 2.0 today, no changes in Coumadin dose Refill ajnttoven  Consider stopping anticoag with hospice level care and bladder cancer, Monitor carefully. Patient would like to continue   A fib Rate controlled on coreg No changes    Laroy Apple, MD [facility] 05/17/2016, 10:13 AM

## 2016-05-27 ENCOUNTER — Encounter: Payer: Self-pay | Admitting: Pediatrics

## 2016-05-27 ENCOUNTER — Ambulatory Visit (INDEPENDENT_AMBULATORY_CARE_PROVIDER_SITE_OTHER): Payer: Medicare Other | Admitting: Pediatrics

## 2016-05-27 VITALS — BP 115/71 | HR 70 | Temp 97.8°F | Ht 71.0 in | Wt 188.0 lb

## 2016-05-27 DIAGNOSIS — J189 Pneumonia, unspecified organism: Secondary | ICD-10-CM

## 2016-05-27 DIAGNOSIS — R52 Pain, unspecified: Secondary | ICD-10-CM | POA: Diagnosis not present

## 2016-05-27 DIAGNOSIS — J181 Lobar pneumonia, unspecified organism: Secondary | ICD-10-CM

## 2016-05-27 DIAGNOSIS — I4891 Unspecified atrial fibrillation: Secondary | ICD-10-CM

## 2016-05-27 LAB — VERITOR FLU A/B WAIVED
INFLUENZA A: NEGATIVE
INFLUENZA B: NEGATIVE

## 2016-05-27 MED ORDER — LEVOFLOXACIN 500 MG PO TABS: 500.0000 mg | ORAL_TABLET | Freq: Every day | ORAL | 0 refills | Status: DC

## 2016-05-27 NOTE — Progress Notes (Signed)
  Subjective:   Patient ID: [name], male    DOB: [birth year], 76 y.o.   MRN: [id number] CC: Generalized Body Aches and Cough  HPI: [name] is a 76 y.o. male presenting for Generalized Body Aches and Cough  Has been sick since last week with URI symptoms Seen here two weeks ago for URI symptoms, started on antihistamine and nasal steroid Pt thinks he might have gotten better, then has definitely had worsening symptoms last two days, ostly with cough No SOB Non-productive cough Chills at home Not sure if having fevers  Relevant past medical, surgical, family and social history reviewed. Allergies and medications reviewed and updated. History  Smoking Status  . Former Smoker  . Packs/day: 0.00  . Years: 0.00  . Types: Cigarettes  . Quit date: 09/21/1973  Smokeless Tobacco  . Never Used   ROS: Per HPI   Objective:    BP 115/71   Pulse 70   Temp 97.8 F (36.6 C) (Oral)   Ht 5\' 11"  (1.803 m)   Wt 188 lb (85.3 kg)   BMI 26.22 kg/m   Wt Readings from Last 3 Encounters:  05/27/16 188 lb (85.3 kg)  05/17/16 186 lb (84.4 kg)  05/03/16 192 lb 6.4 oz (87.3 kg)    Gen: NAD, alert, cooperative with exam, NCAT EYES: EOMI, no conjunctival injection, or no icterus ENT:  TMs pearly gray b/l, OP without erythema LYMPH: no cervical LAD CV: NRRR, normal S1/S2 Resp: rales L base, no wheezes, normal WOB Abd: +BS, soft, NTND. no guarding or organomegaly Ext: No edema, warm Neuro: Alert and oriented MSK: normal muscle bulk  Assessment & Plan:  [place] was seen today for generalized body aches and cough.  Diagnoses and all orders for this visit:  Community acquired pneumonia of left lower lobe of lung ([place]) Flu negative Given rales will treat for CAP Started on levofloxacin, had skin crawling so switched to doxycycline Not able to get CXR in after hours clini -     doxycycline (VIBRA-TABS) 100 MG tablet; Take 1 tablet (100 mg total) by mouth 2 (two) times  daily.  Body aches -     Veritor Flu A/B Waived  Afib On chronic anticoagulation, pt has wanted to continue, is on hospice for bladder cancer Stop warfarin for next 5 days with antibiotics, then restart  Follow up plan: Return if symptoms worsen or fail to improve. Assunta Found, MD [place]

## 2016-05-27 NOTE — Patient Instructions (Signed)
Stop warfarin until Wednesday Come back for recheck 06/03/2016

## 2016-05-28 MED ORDER — DOXYCYCLINE HYCLATE 100 MG PO TABS: 100.0000 mg | ORAL_TABLET | Freq: Two times a day (BID) | ORAL | 0 refills | Status: DC

## 2016-05-30 ENCOUNTER — Ambulatory Visit (INDEPENDENT_AMBULATORY_CARE_PROVIDER_SITE_OTHER): Payer: Medicare Other | Admitting: Ophthalmology

## 2016-05-30 DIAGNOSIS — H33303 Unspecified retinal break, bilateral: Secondary | ICD-10-CM | POA: Diagnosis not present

## 2016-05-30 DIAGNOSIS — H35033 Hypertensive retinopathy, bilateral: Secondary | ICD-10-CM | POA: Diagnosis not present

## 2016-05-30 DIAGNOSIS — I10 Essential (primary) hypertension: Secondary | ICD-10-CM | POA: Diagnosis not present

## 2016-05-30 DIAGNOSIS — H43813 Vitreous degeneration, bilateral: Secondary | ICD-10-CM | POA: Diagnosis not present

## 2016-05-30 DIAGNOSIS — H353131 Nonexudative age-related macular degeneration, bilateral, early dry stage: Secondary | ICD-10-CM | POA: Diagnosis not present

## 2016-05-31 ENCOUNTER — Telehealth: Payer: Self-pay | Admitting: Pediatrics

## 2016-05-31 NOTE — Telephone Encounter (Signed)
Pt called in to inform Doxycycline is causing nausea and urinary retention Pt incouraged to increase H2O intake Call back if sxs persist or worsen Pt verbalizes understanding

## 2016-06-02 ENCOUNTER — Ambulatory Visit: Admitting: Family Medicine

## 2016-06-03 ENCOUNTER — Ambulatory Visit: Admitting: Pediatrics

## 2016-06-03 ENCOUNTER — Ambulatory Visit (INDEPENDENT_AMBULATORY_CARE_PROVIDER_SITE_OTHER): Payer: Medicare Other | Admitting: Family Medicine

## 2016-06-03 ENCOUNTER — Encounter: Payer: Self-pay | Admitting: Family Medicine

## 2016-06-03 VITALS — BP 107/60 | HR 75 | Temp 98.1°F | Ht 71.0 in | Wt 190.6 lb

## 2016-06-03 DIAGNOSIS — Z7901 Long term (current) use of anticoagulants: Secondary | ICD-10-CM

## 2016-06-03 DIAGNOSIS — I4891 Unspecified atrial fibrillation: Secondary | ICD-10-CM

## 2016-06-03 DIAGNOSIS — I482 Chronic atrial fibrillation, unspecified: Secondary | ICD-10-CM

## 2016-06-03 DIAGNOSIS — J189 Pneumonia, unspecified organism: Secondary | ICD-10-CM

## 2016-06-03 LAB — COAGUCHEK XS/INR WAIVED
INR: 1.1 (ref 0.9–1.1)
PROTHROMBIN TIME: 12.8 s

## 2016-06-03 NOTE — Patient Instructions (Signed)
Great to see you!   

## 2016-06-03 NOTE — Progress Notes (Signed)
   HPI  Patient presents today here for follow-up pneumonia.  Patient states that he feels some better. He tolerated doxycycline easily. His appetite is normal. He has mild cough but is breathing normally.  He stopped Coumadin while he was on doxycycline  PMH: Smoking status noted ROS: Per HPI  Objective: BP 107/60   Pulse 75   Temp 98.1 F (36.7 C) (Oral)   Ht 5\' 11"  (1.803 m)   Wt 190 lb 9.6 oz (86.5 kg)   BMI 26.58 kg/m  Gen: NAD, alert, cooperative with exam HEENT: NCAT CV: RRR, good S1/S2, no murmur Resp: CTABL, no wheezes, non-labored Ext: No edema, warm Neuro: Alert and oriented, No gross deficits  Assessment and plan:  # CAP IMproved, Recommended finishing Doxy  # Chronic anticoag For Afib Restart coumadin, INR 1.1, restart 5 mg daily Despite Bladder cancer and hospice care, pt would like to continue, F/u Clinical pharmacist on Wed next weeks, 4-5 days.    Brett Apple, MD [facility] 06/03/2016, 1:23 PM

## 2016-06-06 NOTE — Progress Notes (Signed)
Cardiology Office Note   Date:  06/08/2016   ID:  [name], [name] 1940/10/14, MRN [id number]  PCP:  Eustaquio Maize, MD  Cardiologist:   Minus Breeding, MD   Chief Complaint  Patient presents with  . Coronary Artery Disease      History of Present Illness: [name] is a 76 y.o. male who presents for evaluation of known coronary disease. He was previously seen by Dr. Mare Ferrari.  He has a history of CABG many years ago and a redo in 2007.  He has atrial fibrillation and cardiomyopathy. He has had anxiety. He has an ICD. Cardiac cath 2016 demonstrated an occlusion of the proximal RCA circumflex and LAD. He had a widely patent vein graft sequential to RCA and right coronary distal SVG to ramus intermediate and patent LIMA to the LAD. This was not changed since previous and he was managed medically. He had hernia surgery with then inability to void and was found to have a bladder masss with urothelial carcinoma.  He had surgery on this.  However, he chose not to have any other therapy.  The patient denies any new symptoms such as chest discomfort, neck or arm discomfort. There has been no new shortness of breath, PND or orthopnea. There have been no reported palpitations, presyncope or syncope. He has an excellent attitude.   Past Medical History:  Diagnosis Date  . A-fib (HCC)    a. chronic coumadin  . Anxiety   . Atrial flutter ([place])    a. 01/2011 s/p unsuccessful RFCA complicated by CHB req PPM.  . Bladder cancer ([place])   . Bladder mass 11/17/2015   Urothelial carcinoma until proven otherwise.  [name] Kitchen BPH (benign prostatic hyperplasia)    a. elevated PSA  . CAD (coronary artery disease)    a. 1991 s/p CABGx4;  b. redo CABG in 2007 (SVG->OM, VG->PDA->PLV);  c. 01/2011 Cath: LM 80ost, LAD 100p, LCX 100ost, OM1 100, RCA 100, LIMA->LAD ok, VG->PDA ok, VG->OM ok;  d. 03/2012 MV: EF 32% inf & inflat scar w/o ischemia; e. Lexi MV: EF 33%, inf infarct, no ischemia. f. lexiscan  06/12/2014 RCA scar, no ischemia, EF 26%  . CHB (complete heart block) ([place])    a. 01/2011: in setting of RFCA, s/p SJM 2110 Accent DC PPM, ser # [id number].  [name] Kitchen Chronic systolic CHF (congestive heart failure), NYHA class 2 ([place])    a. 06/2012 Echo: EF 25%, distal sept and apical AK, sev dil LV, mild LVH, mod dil LA.  . Depression    "since [id number]" (08/29/2012)  . Elevated PSA, less than 10 ng/ml   . Fibromyalgia   . GERD (gastroesophageal reflux disease)   . Hiatal hernia    a. 03/01/2013 EGD and esoph dil.  [name] Kitchen Hyperlipidemia   . Hypertension   . Hyponatremia 11/17/2015   Water intoxication versus SIADH.  . Ischemic cardiomyopathy    a. 06/2012 EF 25% by echo.  30% 2016.   [name] Kitchen Kidney cysts    a. bilateral  . neuroendocrine   . Pacemaker    explanted 06/15/15 >>> CRT-D STJ device, Dr. Caryl Comes  . Panic attacks   . Skin cancer    'above left eyebrow; tip of my nose" (08/29/2012)    Past Surgical History:  Procedure Laterality Date  . ATRIAL FLUTTER ABLATION  01/2011   "didn't work" (08/29/2012)  . CARDIAC CATHETERIZATION  10/30/2009   Grafts patent. Mild to moderate LV dysfunction. EF 45%. Managed medically.   [name] Kitchen  CARDIAC CATHETERIZATION     multiple  . CARDIAC CATHETERIZATION N/A 04/28/2015   Procedure: Left Heart Cath and Cors/Grafts Angiography;  Surgeon: Belva Crome, MD;  Location: [place] [facility];  Service: Cardiovascular;  Laterality: N/A;  . CARDIOVERSION  05/25/2012   Procedure: CARDIOVERSION;  Surgeon: Darlin Coco, MD;  Location: [place];  Service: Cardiovascular;  Laterality: N/A;  . CATARACT EXTRACTION W/ INTRAOCULAR LENS  IMPLANT, BILATERAL  2012  . CHOLECYSTECTOMY  2007  . CORONARY ARTERY BYPASS GRAFT  08/27/1989   CABG X 5  . CORONARY ARTERY BYPASS GRAFT  06/2005   CABG X 3  . EP IMPLANTABLE DEVICE N/A 06/15/2015   STJ, CRT-D, Dr. Caryl Comes  . EXCISIONAL HEMORRHOIDECTOMY  ~ 1980  . [place]   "growth on the bone taken off; not cancer" (08/29/2012)  .  INGUINAL HERNIA REPAIR  1960's   "left I think"  . INGUINAL HERNIA REPAIR Right 10/12/2015   Procedure: RIGHT INGUINAL HERNIA REPAIR WITH MESH;  Surgeon: Jackolyn Confer, MD;  Location: [place];  Service: General;  Laterality: Right;  . INSERT / REPLACE / REMOVE PACEMAKER  01/2011   initial placement  . INSERTION OF MESH Right 10/12/2015   Procedure: INSERTION OF MESH, RIGHT INGUINAL HERNIA;  Surgeon: Jackolyn Confer, MD;  Location: [place];  Service: General;  Laterality: Right;  . SKIN CANCER EXCISION     'above left eyebrow; tip of my nose" (08/29/2012)  . TONSILLECTOMY  1960     Current Outpatient Prescriptions  Medication Sig Dispense Refill  . acetaminophen (TYLENOL) 500 MG tablet Take 500 mg by mouth every 8 (eight) hours as needed for mild pain.     [name] Kitchen ALPRAZolam (XANAX) 1 MG tablet Take 1 tablet (1 mg total) by mouth 3 (three) times daily as needed. For anxiety 270 tablet 1  . carvedilol (COREG) 6.25 MG tablet Take 1 tablet (6.25 mg total) by mouth 2 (two) times daily. 180 tablet 2  . doxepin (SINEQUAN) 25 MG capsule Take 25 mg (1 capsule) by mouth in the morning and 50 mg (2 capsules) by mouth at night. (Patient taking differently: Take 25-50 mg by mouth 2 (two) times daily. Take 25 mg (1 capsule) by mouth in the morning and 50 mg (2 capsules) by mouth at night.) 270 capsule 3  . fluticasone (FLONASE) 50 MCG/ACT nasal spray Place 2 sprays into both nostrils daily. 16 g 6  . furosemide (LASIX) 20 MG tablet Take 1 tablet (20 mg total) by mouth daily. 30 tablet 1  . gabapentin (NEURONTIN) 100 MG capsule Take 100 mg by mouth 4 (four) times daily.    [name] Kitchen guaiFENesin (MUCINEX) 600 MG 12 hr tablet Take 600 mg by mouth 2 (two) times daily as needed for cough or to loosen phlegm. Reported on 11/12/2015    . hydroxypropyl methylcellulose / hypromellose (ISOPTO TEARS / GONIOVISC) 2.5 % ophthalmic solution Place 1 drop into both eyes 3 (three) times daily as needed for dry eyes.    . isosorbide  mononitrate (IMDUR) 60 MG 24 hr tablet Take 1 tablet (60 mg total) by mouth daily. 90 tablet 2  . lisinopril (PRINIVIL,ZESTRIL) 5 MG tablet Take 1 tablet (5 mg total) by mouth every other day. 90 tablet 3  . loratadine (CLARITIN) 10 MG tablet Take 10 mg by mouth daily.    . nitroGLYCERIN (NITROSTAT) 0.4 MG SL tablet DISSOLVE ONE TABLET UNDER THE TONGUE EVERY 5 MINUTES AS NEEDED FOR CHEST PAIN.  DO NOT  EXCEED A TOTAL OF 3 DOSES IN 15 MINUTES 100 tablet 3  . omeprazole (PRILOSEC) 20 MG capsule Take 1 capsule (20 mg total) by mouth 2 (two) times daily. 180 capsule 3  . polyethylene glycol (MIRALAX / GLYCOLAX) packet Take 17 g by mouth daily.     . rosuvastatin (CRESTOR) 10 MG tablet Take 1 tablet (10 mg total) by mouth daily. 90 tablet 2  . warfarin (JANTOVEN) 5 MG tablet Take 5 mg by mouth every evening.     No current facility-administered medications for this visit.     Allergies:   Hydrocodone-acetaminophen; Levofloxacin; Prednisone; Sulfa drugs cross reactors; Ultram [tramadol hcl]; Xarelto [rivaroxaban]; and Zithromax [azithromycin]    ROS:  Please see the history of present illness.   Otherwise, review of systems are positive for none.   All other systems are reviewed and negative.    PHYSICAL EXAM: VS:  BP 91/64   Pulse 70   Ht 5\' 11"  (1.803 m)   Wt 185 lb (83.9 kg)   BMI 25.80 kg/m  , BMI Body mass index is 25.8 kg/m. GENERAL:  Well appearing NECK:  Mild jugular venous distention, waveform within normal limits, carotid upstroke brisk and symmetric, no bruits, no thyromegaly LYMPHATICS:  No cervical, inguinal adenopathy LUNGS:  Clear to auscultation bilaterally BACK:  No CVA tenderness CHEST:  Well healed sternotomy scar, the ICD pocket has a small area that is still healing but there is no drainage or erythema in this medial aspect of the wound HEART:  PMI not displaced or sustained,S1 and S2 within normal limits, no S3, no S4, no clicks, no rubs, no murmurs ABD:  Flat,  positive bowel sounds normal in frequency in pitch, no bruits, no rebound, no guarding, no midline pulsatile mass, no hepatomegaly, no splenomegaly EXT:  2 plus pulses upper, mildly reduced dorsalis pedis and posterior tibialis bilaterally lower, no edema, no cyanosis no clubbing   EKG:  EKG was ordered today. Ventricular paced rhythm 100% capture rate 71  Lab Results  Component Value Date   HGBA1C 6.1 (H) 08/05/2015    Recent Labs: 11/18/2015: TSH 1.702 03/02/2016: ALT 19 03/03/2016: BUN 19; Creatinine, Ser 1.21; Hemoglobin 12.3; Platelets 131; Potassium 4.0; Sodium 128    Lipid Panel    Component Value Date/Time   CHOL 111 08/05/2015 0000   TRIG 61 08/05/2015 0000   HDL 41 08/05/2015 0000   CHOLHDL 2.7 08/05/2015 0000   CHOLHDL 3.1 03/25/2015 0753   VLDL 16 03/25/2015 0753   LDLCALC 58 08/05/2015 0000   LDLDIRECT 77.3 12/20/2010 0922   Lab Results  Component Value Date   HGBA1C 6.1 (H) 08/05/2015    Wt Readings from Last 3 Encounters:  06/08/16 185 lb (83.9 kg)  06/03/16 190 lb 9.6 oz (86.5 kg)  05/27/16 188 lb (85.3 kg)      Other studies Reviewed: Additional studies/ records that were reviewed today include:   Hospital records  Review of the above records demonstrates:      ASSESSMENT AND PLAN:  Ischemic heart disease status post CABG remotely and status post redo CABG in 2007:  Coronary anatomy from late 2016 is as above.  He has no symptoms. No change in therapy is indicated.    Hyperlipidemia:  This is recorded as above. He did have his meds switched to Crestor.  I will check a lipid profile.   Hypertensive heart disease without heart failure: His blood pressures well controlled. He will continue the meds as listed.  Permanent atrial fibrillation with functioning ventricular pacemaker, following ablation for atrial flutter resulting in complete heart block. On Coumadin.  He has not tolerated Xarelto in the past.  Mr. ESTANISLAO TORSTENSON has a CHA2DS2 - VASc  score of 4 with a risk of stroke of 4%.  He tolerates warfarin and will continue this.    Ischemic cardiomyopathy:  . Echo shows aortic valve sclerosis and mild mitral regurgitation. I will follow this clinically.   Most recent ejection fraction by echo on 04/2015  is 30%, improved from 25%. No change in therapy is indicated.  Blood pressure will not allow med titration.     Current medicines are reviewed at length with the patient today.  The patient does not have concerns regarding medicines.  The following changes have been made:  None Labs/ tests ordered today include:   Orders Placed This Encounter  Procedures  . Lipid panel  . EKG 12-Lead     Disposition:   FU with me in 6 months.     Signed, Minus Breeding, MD  06/08/2016 6:55 PM    [facility]

## 2016-06-07 ENCOUNTER — Other Ambulatory Visit: Payer: Self-pay | Admitting: Internal Medicine

## 2016-06-07 ENCOUNTER — Other Ambulatory Visit: Payer: Self-pay | Admitting: Pediatrics

## 2016-06-07 DIAGNOSIS — F419 Anxiety disorder, unspecified: Secondary | ICD-10-CM

## 2016-06-08 ENCOUNTER — Ambulatory Visit (INDEPENDENT_AMBULATORY_CARE_PROVIDER_SITE_OTHER): Payer: Medicare Other | Admitting: Pharmacist

## 2016-06-08 ENCOUNTER — Ambulatory Visit (INDEPENDENT_AMBULATORY_CARE_PROVIDER_SITE_OTHER): Payer: Medicare Other | Admitting: Cardiology

## 2016-06-08 ENCOUNTER — Encounter: Payer: Self-pay | Admitting: Cardiology

## 2016-06-08 ENCOUNTER — Telehealth: Payer: Self-pay | Admitting: Pediatrics

## 2016-06-08 ENCOUNTER — Other Ambulatory Visit: Payer: Self-pay | Admitting: Pharmacist

## 2016-06-08 VITALS — BP 91/64 | HR 70 | Ht 71.0 in | Wt 185.0 lb

## 2016-06-08 DIAGNOSIS — E7800 Pure hypercholesterolemia, unspecified: Secondary | ICD-10-CM

## 2016-06-08 DIAGNOSIS — I4891 Unspecified atrial fibrillation: Secondary | ICD-10-CM

## 2016-06-08 DIAGNOSIS — I10 Essential (primary) hypertension: Secondary | ICD-10-CM

## 2016-06-08 DIAGNOSIS — Z79899 Other long term (current) drug therapy: Secondary | ICD-10-CM

## 2016-06-08 LAB — LIPID PANEL
CHOLESTEROL TOTAL: 140 mg/dL (ref 100–199)
Chol/HDL Ratio: 3 ratio units (ref 0.0–5.0)
HDL: 47 mg/dL (ref >39)
LDL Calculated: 77 mg/dL (ref 0–99)
TRIGLYCERIDES: 82 mg/dL (ref 0–149)
VLDL CHOLESTEROL CAL: 16 mg/dL (ref 5–40)

## 2016-06-08 LAB — COAGUCHEK XS/INR WAIVED
INR: 1.2 — ABNORMAL HIGH (ref 0.9–1.1)
PROTHROMBIN TIME: 14.2 s

## 2016-06-08 MED ORDER — CARVEDILOL 6.25 MG PO TABS: 6.2500 mg | ORAL_TABLET | Freq: Two times a day (BID) | ORAL | 2 refills | Status: AC

## 2016-06-08 MED ORDER — ROSUVASTATIN CALCIUM 10 MG PO TABS: 10.0000 mg | ORAL_TABLET | Freq: Every day | ORAL | 2 refills | Status: AC

## 2016-06-08 NOTE — Telephone Encounter (Signed)
OK to call in

## 2016-06-08 NOTE — Telephone Encounter (Signed)
Patient states that while he was taking meds he had light colored stools which is listed as a possible side effect to the med he was taking. Wants to know if you think he should have his liver checked? Please advise and route to [facility]

## 2016-06-08 NOTE — Patient Instructions (Signed)
Medication Instructions:  The current medical regimen is effective;  continue present plan and medications.  Labwork: Please have blood work today at [facility]  (Lipid).  Follow-Up: Follow up in 6 months with Dr. Percival Spanish.  You will receive a letter in the mail 2 months before you are due.  Please call us when you receive this letter to schedule your follow up appointment.  If you need a refill on your cardiac medications before your next appointment, please call your pharmacy.  Thank you for choosing [facility]!!

## 2016-06-08 NOTE — Patient Instructions (Signed)
Sunday Monday Tuesday Wednesday Thursday Friday Saturday      2/7 1 tablet 2/8 No warfarin 2/9 1 tablet  (take after procedure 2/10 1 tablet  2/11 1 tablet 2/12 1/2 tablet 2/13 1 tablet 2/14 1/2 tablet  2/15 1 tablet 2/16 1/2 tablet 2/17 1 tablet   2/18 1 tablet 2/19 1/2 tablet 2/20 1 tablet 2/21 1/2 tablet 2/22 1 tablet 2/23 1/2 tablet 2/24 1 tablet

## 2016-06-08 NOTE — Telephone Encounter (Signed)
Pt reports that stool is back to normal  If sxs return, pt will schedule appt

## 2016-06-08 NOTE — Telephone Encounter (Signed)
Called to [facility] 

## 2016-06-08 NOTE — Telephone Encounter (Signed)
If all of his stools have been white chalky colored, he should come in and we will run blood tests. If they are light brown/yellow, could be from either what he is eating or recent viral illness, should get better over next few weeks, let me know if not.

## 2016-06-13 ENCOUNTER — Telehealth: Payer: Self-pay | Admitting: Pediatrics

## 2016-06-13 NOTE — Telephone Encounter (Signed)
appt changed to 06/20/16 at 12:30

## 2016-06-16 ENCOUNTER — Encounter: Payer: Self-pay | Admitting: Pediatrics

## 2016-06-16 ENCOUNTER — Encounter: Payer: Self-pay | Admitting: Internal Medicine

## 2016-06-16 ENCOUNTER — Ambulatory Visit (INDEPENDENT_AMBULATORY_CARE_PROVIDER_SITE_OTHER): Payer: Medicare Other | Admitting: Pediatrics

## 2016-06-16 ENCOUNTER — Ambulatory Visit (INDEPENDENT_AMBULATORY_CARE_PROVIDER_SITE_OTHER): Payer: Medicare Other | Admitting: Internal Medicine

## 2016-06-16 VITALS — BP 110/66 | HR 73 | Ht 71.0 in | Wt 189.4 lb

## 2016-06-16 VITALS — BP 111/62 | HR 74 | Temp 97.0°F | Ht 71.0 in | Wt 189.0 lb

## 2016-06-16 DIAGNOSIS — J069 Acute upper respiratory infection, unspecified: Secondary | ICD-10-CM

## 2016-06-16 DIAGNOSIS — I4819 Other persistent atrial fibrillation: Secondary | ICD-10-CM

## 2016-06-16 DIAGNOSIS — I255 Ischemic cardiomyopathy: Secondary | ICD-10-CM | POA: Diagnosis not present

## 2016-06-16 DIAGNOSIS — I442 Atrioventricular block, complete: Secondary | ICD-10-CM

## 2016-06-16 DIAGNOSIS — I481 Persistent atrial fibrillation: Secondary | ICD-10-CM

## 2016-06-16 DIAGNOSIS — Z95810 Presence of automatic (implantable) cardiac defibrillator: Secondary | ICD-10-CM

## 2016-06-16 DIAGNOSIS — R52 Pain, unspecified: Secondary | ICD-10-CM

## 2016-06-16 LAB — CUP PACEART INCLINIC DEVICE CHECK
HighPow Impedance: 72 Ohm
Implantable Lead Implant Date: 20170213
Implantable Lead Location: 753859
Implantable Lead Location: 753860
Implantable Lead Model: 7122
Lead Channel Impedance Value: 387.5 Ohm
Lead Channel Pacing Threshold Amplitude: 1 V
Lead Channel Pacing Threshold Amplitude: 1 V
Lead Channel Pacing Threshold Pulse Width: 0.5 ms
Lead Channel Pacing Threshold Pulse Width: 0.5 ms
Lead Channel Pacing Threshold Pulse Width: 0.8 ms
Lead Channel Setting Pacing Amplitude: 2.5 V
Lead Channel Setting Pacing Pulse Width: 0.5 ms
Lead Channel Setting Pacing Pulse Width: 0.8 ms
Lead Channel Setting Sensing Sensitivity: 2 mV
MDC IDC LEAD IMPLANT DT: 20120927
MDC IDC LEAD IMPLANT DT: 20170213
MDC IDC LEAD LOCATION: 753858
MDC IDC MSMT LEADCHNL LV IMPEDANCE VALUE: 662.5 Ohm
MDC IDC MSMT LEADCHNL LV PACING THRESHOLD AMPLITUDE: 1 V
MDC IDC MSMT LEADCHNL RV IMPEDANCE VALUE: 587.5 Ohm
MDC IDC MSMT LEADCHNL RV SENSING INTR AMPL: 11.8 mV
MDC IDC PG IMPLANT DT: 20170213
MDC IDC PG SERIAL: 7335682
MDC IDC SESS DTM: 20180215172839
MDC IDC SET LEADCHNL LV PACING AMPLITUDE: 2 V
MDC IDC STAT BRADY RA PERCENT PACED: 0 %
MDC IDC STAT BRADY RV PERCENT PACED: 99.42 %

## 2016-06-16 LAB — VERITOR FLU A/B WAIVED
INFLUENZA A: NEGATIVE
INFLUENZA B: NEGATIVE

## 2016-06-16 NOTE — Patient Instructions (Signed)
Medication Instructions:  Your physician recommends that you continue on your current medications as directed. Please refer to the Current Medication list given to you today.   Labwork: None Ordered   Testing/Procedures: None Ordered   Follow-Up: Follow-up with Dr. Klein as needed  Any Other Special Instructions Will Be Listed Below (If Applicable).     If you need a refill on your cardiac medications before your next appointment, please call your pharmacy.   

## 2016-06-16 NOTE — Progress Notes (Signed)
  Subjective:   Patient ID: [name], male    DOB: 08/18/1940, 76 y.o.   MRN: [id number] CC: Generalized Body Aches (pt here today c/o body aches and cough)  HPI: [name] is a 76 y.o. male presenting for Generalized Body Aches (pt here today c/o body aches and cough)  Started yesterday No fevers Feels like he is coming down with something Appetite slightly off Otherwise has been feeling well Minimal sore throat Upcoming urology procedure per pt for eval tumor  Relevant past medical, surgical, family and social history reviewed. Allergies and medications reviewed and updated. History  Smoking Status  . Former Smoker  . Packs/day: 0.00  . Years: 0.00  . Types: Cigarettes  . Quit date: 09/21/1973  Smokeless Tobacco  . Never Used   ROS: Per HPI   Objective:    BP 111/62   Pulse 74   Temp 97 F (36.1 C) (Oral)   Ht 5\' 11"  (1.803 m)   Wt 189 lb (85.7 kg)   BMI 26.36 kg/m   Wt Readings from Last 3 Encounters:  06/16/16 189 lb (85.7 kg)  06/16/16 189 lb 6.4 oz (85.9 kg)  06/08/16 185 lb (83.9 kg)    Gen: NAD, alert, cooperative with exam, NCAT EYES: EOMI, no conjunctival injection, or no icterus ENT:  TMs pearly gray b/l, OP without erythema LYMPH: no cervical LAD CV: NRRR, normal S1/S2, no murmur Resp: crackles at bases that clear with deep breaths, no wheezes, normal WOB Ext: No edema, warm Neuro: Alert and oriented  Assessment & Plan:  [name] was seen today for generalized body aches.  Diagnoses and all orders for this visit:  Acute URI Flu negative No fevers Feeling well other than the achy feeling Discussed symptom care, return precautions  Body aches -     Veritor Flu A/B Waived   Follow up plan: prn Assunta Found, MD [place]

## 2016-06-16 NOTE — Progress Notes (Signed)
Patient Care Team: Eustaquio Maize, MD as PCP - General (Pediatrics)   HPI  [name] is a 76 y.o. male Seen in follow-up for CRT upgrade accomplished 2/17. He has a history of complete heart block with progressive left ventricular dysfunction from 2000 1245--percent. He has coronary artery disease with prior bypass and redo bypass and a nonischemic Myoview 2016; cath to 12/16 demonstrated patent grafts.  Permanent atrial fibrillation. No prior stroke   The patient denies chest pain, much less shortness of breath, no  nocturnal dyspnea, orthopnea or peripheral edema.  There have been no palpitations, lightheadedness or syncope. He is limited as below in the upper had a stroke  Has been diagnosed with bladder cancer, stage IV  He has asked [place] to turn off high-voltage therapy.      Records and Results Reviewed   Past Medical History:  Diagnosis Date  . A-fib (HCC)    a. chronic coumadin  . Anxiety   . Atrial flutter ([place])    a. 01/2011 s/p unsuccessful RFCA complicated by CHB req PPM.  . Bladder cancer ([place])   . Bladder mass 11/17/2015   Urothelial carcinoma until proven otherwise.  [name] Kitchen BPH (benign prostatic hyperplasia)    a. elevated PSA  . CAD (coronary artery disease)    a. 1991 s/p CABGx4;  b. redo CABG in 2007 (SVG->OM, VG->PDA->PLV);  c. 01/2011 Cath: LM 80ost, LAD 100p, LCX 100ost, OM1 100, RCA 100, LIMA->LAD ok, VG->PDA ok, VG->OM ok;  d. 03/2012 MV: EF 32% inf & inflat scar w/o ischemia; e. Lexi MV: EF 33%, inf infarct, no ischemia. f. lexiscan 06/12/2014 RCA scar, no ischemia, EF 26%  . CHB (complete heart block) ([place])    a. 01/2011: in setting of RFCA, s/p SJM 2110 Accent DC PPM, ser # [id number].  [name] Kitchen Chronic systolic CHF (congestive heart failure), NYHA class 2 ([place])    a. 06/2012 Echo: EF 25%, distal sept and apical AK, sev dil LV, mild LVH, mod dil LA.  . Depression    "since [id number]" (08/29/2012)  . Elevated PSA, less than 10 ng/ml   . Fibromyalgia   .  GERD (gastroesophageal reflux disease)   . Hiatal hernia    a. 03/01/2013 EGD and esoph dil.  [name] Kitchen Hyperlipidemia   . Hypertension   . Hyponatremia 11/17/2015   Water intoxication versus SIADH.  . Ischemic cardiomyopathy    a. 06/2012 EF 25% by echo.  30% 2016.   [name] Kitchen Kidney cysts    a. bilateral  . neuroendocrine   . Pacemaker    explanted 06/15/15 >>> CRT-D STJ device, Dr. Caryl Comes  . Panic attacks   . Skin cancer    'above left eyebrow; tip of my nose" (08/29/2012)    Past Surgical History:  Procedure Laterality Date  . ATRIAL FLUTTER ABLATION  01/2011   "didn't work" (08/29/2012)  . CARDIAC CATHETERIZATION  10/30/2009   Grafts patent. Mild to moderate LV dysfunction. EF 45%. Managed medically.   [name] Kitchen CARDIAC CATHETERIZATION     multiple  . CARDIAC CATHETERIZATION N/A 04/28/2015   Procedure: Left Heart Cath and Cors/Grafts Angiography;  Surgeon: Belva Crome, MD;  Location: [place] [facility];  Service: Cardiovascular;  Laterality: N/A;  . CARDIOVERSION  05/25/2012   Procedure: CARDIOVERSION;  Surgeon: Darlin Coco, MD;  Location: [place];  Service: Cardiovascular;  Laterality: N/A;  . CATARACT EXTRACTION W/ INTRAOCULAR LENS  IMPLANT, BILATERAL  2012  . CHOLECYSTECTOMY  2007  .  CORONARY ARTERY BYPASS GRAFT  08/27/1989   CABG X 5  . CORONARY ARTERY BYPASS GRAFT  06/2005   CABG X 3  . EP IMPLANTABLE DEVICE N/A 06/15/2015   STJ, CRT-D, Dr. Caryl Comes  . EXCISIONAL HEMORRHOIDECTOMY  ~ 1980  . [place]   "growth on the bone taken off; not cancer" (08/29/2012)  . INGUINAL HERNIA REPAIR  1960's   "left I think"  . INGUINAL HERNIA REPAIR Right 10/12/2015   Procedure: RIGHT INGUINAL HERNIA REPAIR WITH MESH;  Surgeon: Jackolyn Confer, MD;  Location: [place];  Service: General;  Laterality: Right;  . INSERT / REPLACE / REMOVE PACEMAKER  01/2011   initial placement  . INSERTION OF MESH Right 10/12/2015   Procedure: INSERTION OF MESH, RIGHT INGUINAL HERNIA;  Surgeon: Jackolyn Confer,  MD;  Location: [place];  Service: General;  Laterality: Right;  . SKIN CANCER EXCISION     'above left eyebrow; tip of my nose" (08/29/2012)  . TONSILLECTOMY  1960    Current Outpatient Prescriptions  Medication Sig Dispense Refill  . acetaminophen (TYLENOL) 500 MG tablet Take 500 mg by mouth every 8 (eight) hours as needed for mild pain.     [name] Kitchen ALPRAZolam (XANAX) 1 MG tablet TAKE ONE TABLET 3 TIMES A DAY AS NEEDED. 270 tablet 1  . carvedilol (COREG) 6.25 MG tablet Take 1 tablet (6.25 mg total) by mouth 2 (two) times daily. 180 tablet 2  . doxepin (SINEQUAN) 25 MG capsule Take 25 mg (1 capsule) by mouth in the morning and 50 mg (2 capsules) by mouth at night. (Patient taking differently: Take 25-50 mg by mouth 2 (two) times daily. Take 25 mg (1 capsule) by mouth in the morning and 50 mg (2 capsules) by mouth at night.) 270 capsule 3  . fluticasone (FLONASE) 50 MCG/ACT nasal spray Place 2 sprays into both nostrils daily. 16 g 6  . furosemide (LASIX) 20 MG tablet Take 1 tablet (20 mg total) by mouth daily. 30 tablet 1  . gabapentin (NEURONTIN) 100 MG capsule Take 100 mg by mouth 4 (four) times daily.    [name] Kitchen guaiFENesin (MUCINEX) 600 MG 12 hr tablet Take 600 mg by mouth 2 (two) times daily as needed for cough or to loosen phlegm. Reported on 11/12/2015    . hydroxypropyl methylcellulose / hypromellose (ISOPTO TEARS / GONIOVISC) 2.5 % ophthalmic solution Place 1 drop into both eyes 3 (three) times daily as needed for dry eyes.    . isosorbide mononitrate (IMDUR) 60 MG 24 hr tablet Take 1 tablet (60 mg total) by mouth daily. 90 tablet 2  . lisinopril (PRINIVIL,ZESTRIL) 5 MG tablet Take 1 tablet (5 mg total) by mouth every other day. 90 tablet 3  . loratadine (CLARITIN) 10 MG tablet Take 10 mg by mouth daily.    . nitroGLYCERIN (NITROSTAT) 0.4 MG SL tablet DISSOLVE ONE TABLET UNDER THE TONGUE EVERY 5 MINUTES AS NEEDED FOR CHEST PAIN.  DO NOT EXCEED A TOTAL OF 3 DOSES IN 15 MINUTES 100 tablet 3  . omeprazole  (PRILOSEC) 20 MG capsule Take 1 capsule (20 mg total) by mouth 2 (two) times daily. 180 capsule 3  . polyethylene glycol (MIRALAX / GLYCOLAX) packet Take 17 g by mouth daily.     . rosuvastatin (CRESTOR) 10 MG tablet Take 1 tablet (10 mg total) by mouth daily. 90 tablet 2  . warfarin (JANTOVEN) 5 MG tablet Take 5 mg by mouth every evening.     No current facility-administered  medications for this visit.     Allergies  Allergen Reactions  . Hydrocodone-Acetaminophen Itching  . Levofloxacin     Skin crawling  . Prednisone Itching  . Sulfa Drugs Cross Reactors Other (See Comments)    unknown  . Ultram [Tramadol Hcl] Itching    "don't remember how bad"  . Xarelto [Rivaroxaban] Other (See Comments)    Itching   . Zithromax [Azithromycin] Itching    Itching       Review of Systems negative except from HPI and PMH  Physical Exam BP 110/66   Pulse 73   Ht 5\' 11"  (1.803 m)   Wt 189 lb 6.4 oz (85.9 kg)   SpO2 98%   BMI 26.42 kg/m  Well developed and well nourished in no acute distress HENT normal E scleral and icterus clear Neck Supple JVP flat; carotids brisk and full Clear to ausculation  Gross*Regular rate and rhythm, no murmurs gallops or rub Soft with active bowel sounds No clubbing cyanosis  Edema Alert and oriented, grossly normal motor and sensory function Skin Warm and Dry  ECG demonstrated biventricular pacing at 71   Assessment and  Plan  ICM s/-p CABG  CRT-D  Atrial fibrillation   Stage IV bbladder cancer  At his request we have turned off high voltage therapy  We will see him in one year if he is still alive. I had a chance to say goodbye.  We spent more than 50% of our >25 min visit in face to face counseling regarding the above

## 2016-06-17 ENCOUNTER — Encounter: Payer: Self-pay | Admitting: Pharmacist Clinician (PhC)/ Clinical Pharmacy Specialist

## 2016-06-20 ENCOUNTER — Ambulatory Visit (INDEPENDENT_AMBULATORY_CARE_PROVIDER_SITE_OTHER): Payer: Medicare Other | Admitting: Pharmacist

## 2016-06-20 DIAGNOSIS — I482 Chronic atrial fibrillation, unspecified: Secondary | ICD-10-CM

## 2016-06-20 DIAGNOSIS — I4891 Unspecified atrial fibrillation: Secondary | ICD-10-CM

## 2016-06-20 LAB — COAGUCHEK XS/INR WAIVED
INR: 2.1 — ABNORMAL HIGH (ref 0.9–1.1)
PROTHROMBIN TIME: 25.4 s

## 2016-06-20 MED ORDER — JANTOVEN 5 MG PO TABS: 5.0000 mg | ORAL_TABLET | Freq: Every evening | ORAL | 0 refills | Status: DC

## 2016-06-20 NOTE — Patient Instructions (Signed)
Anticoagulation Dose Instructions as of 06/20/2016      [name] Tue Wed Thu Fri Sat   New Dose 5 mg 2.5 mg 5 mg 2.5 mg 5 mg 2.5 mg 5 mg    Description   Continue current warfarin 5mg  dose - take 1/2 tablet Mondays, Wednesday and Fridays.  Take 1 tablet all other days.  INR was 2.1 today

## 2016-07-01 ENCOUNTER — Ambulatory Visit (INDEPENDENT_AMBULATORY_CARE_PROVIDER_SITE_OTHER): Payer: Medicare Other | Admitting: Pediatrics

## 2016-07-01 ENCOUNTER — Encounter: Payer: Self-pay | Admitting: Pharmacist

## 2016-07-01 VITALS — BP 121/76 | HR 69 | Temp 96.9°F | Ht 71.0 in | Wt 190.0 lb

## 2016-07-01 DIAGNOSIS — I25119 Atherosclerotic heart disease of native coronary artery with unspecified angina pectoris: Secondary | ICD-10-CM

## 2016-07-01 DIAGNOSIS — F419 Anxiety disorder, unspecified: Secondary | ICD-10-CM

## 2016-07-01 DIAGNOSIS — Z7901 Long term (current) use of anticoagulants: Secondary | ICD-10-CM | POA: Diagnosis not present

## 2016-07-01 DIAGNOSIS — J111 Influenza due to unidentified influenza virus with other respiratory manifestations: Secondary | ICD-10-CM | POA: Diagnosis not present

## 2016-07-01 DIAGNOSIS — I4891 Unspecified atrial fibrillation: Secondary | ICD-10-CM

## 2016-07-01 LAB — COAGUCHEK XS/INR WAIVED
INR: 1.9 — ABNORMAL HIGH (ref 0.9–1.1)
Prothrombin Time: 23 s

## 2016-07-01 MED ORDER — OSELTAMIVIR PHOSPHATE 75 MG PO CAPS: 75.0000 mg | ORAL_CAPSULE | Freq: Two times a day (BID) | ORAL | 0 refills | Status: AC

## 2016-07-01 MED ORDER — NITROGLYCERIN 0.4 MG SL SUBL: SUBLINGUAL_TABLET | SUBLINGUAL | 3 refills | Status: AC

## 2016-07-01 MED ORDER — ALPRAZOLAM 1 MG PO TABS: ORAL_TABLET | ORAL | 1 refills | Status: AC

## 2016-07-01 NOTE — Progress Notes (Signed)
  Subjective:   Patient ID: [name], male    DOB: [birth year], 76 y.o.   MRN: [id number] CC: Headache and Cough  HPI: [name] is a 76 y.o. male presenting for Headache and Cough  Started yesterday Has had cold chills Woke up today feeling very achy No fevers Been around someone with the flu the day before Appetite is ok  Anxiety:  Taking xanax three times a day Has kept anxiety well controlled Tried on other medications in the past, not able to control symptoms  Chronic anticoagulation: Wants to remain on warfarin as long as he is able for stroke prevention If he starts having blood in urine from bladder cancer will need to stop  Relevant past medical, surgical, family and social history reviewed. Allergies and medications reviewed and updated. History  Smoking Status  . Former Smoker  . Packs/day: 0.00  . Years: 0.00  . Types: Cigarettes  . Quit date: 09/21/1973  Smokeless Tobacco  . Never Used   ROS: Per HPI   Objective:    BP 121/76   Pulse 69   Temp (!) 96.9 F (36.1 C) (Oral)   Ht 5\' 11"  (1.803 m)   Wt 190 lb (86.2 kg)   BMI 26.50 kg/m   Wt Readings from Last 3 Encounters:  07/01/16 190 lb (86.2 kg)  06/16/16 189 lb (85.7 kg)  06/16/16 189 lb 6.4 oz (85.9 kg)    Gen: NAD, alert, cooperative with exam, NCAT EYES: EOMI, no conjunctival injection, or no icterus ENT:  TMs dull gray b/l, OP without erythema LYMPH: no cervical LAD CV: normal rate, reg rhythm, normal S1/S2 Resp: crackles b/l bases that clear with deep breaths, no wheezes, normal WOB Ext: No edema, warm Neuro: Alert and oriented, strength equal b/l UE and LE, coordination grossly normal Skin: no rash Psych: full affect  Assessment & Plan:  [name] was seen today for headache and cough.  Diagnoses and all orders for this visit:  Influenza Flu exposure, now with aches, cold chills, will treat for presumed flu as below -     oseltamivir (TAMIFLU) 75 MG capsule; Take 1  capsule (75 mg total) by mouth 2 (two) times daily.  Anxiety Symptoms well controlled with below Have tried other medications in the past Will continue -     ALPRAZolam (XANAX) 1 MG tablet; TAKE ONE TABLET 3 TIMES A DAY AS NEEDED.  Coronary artery disease involving native coronary artery of native heart with angina pectoris (HCC) Stable symptoms, follows with cardiology -     nitroGLYCERIN (NITROSTAT) 0.4 MG SL tablet; DISSOLVE ONE TABLET UNDER THE TONGUE EVERY 5 MINUTES AS NEEDED FOR CHEST PAIN.  DO NOT EXCEED A TOTAL OF 3 DOSES IN 15 MINUTES  Chronic anticoagulation INR 1.9, will continue current dosing -     CoaguChek XS/INR Waived  Atrial fibrillation, unspecified type ([place])   Follow up plan: Return in about 3 months (around 10/01/2016). Assunta Found, MD [place]

## 2016-07-02 ENCOUNTER — Encounter: Payer: Self-pay | Admitting: Pediatrics

## 2016-07-08 ENCOUNTER — Encounter: Payer: Self-pay | Admitting: Pediatrics

## 2016-07-08 ENCOUNTER — Ambulatory Visit (INDEPENDENT_AMBULATORY_CARE_PROVIDER_SITE_OTHER): Payer: Medicare Other | Admitting: Pediatrics

## 2016-07-08 ENCOUNTER — Telehealth: Payer: Self-pay | Admitting: Internal Medicine

## 2016-07-08 ENCOUNTER — Telehealth: Payer: Self-pay | Admitting: *Deleted

## 2016-07-08 VITALS — BP 105/68 | HR 70 | Temp 97.0°F | Resp 18 | Ht 71.0 in | Wt 188.0 lb

## 2016-07-08 DIAGNOSIS — J069 Acute upper respiratory infection, unspecified: Secondary | ICD-10-CM | POA: Diagnosis not present

## 2016-07-08 DIAGNOSIS — I255 Ischemic cardiomyopathy: Secondary | ICD-10-CM

## 2016-07-08 DIAGNOSIS — Z7901 Long term (current) use of anticoagulants: Secondary | ICD-10-CM | POA: Diagnosis not present

## 2016-07-08 LAB — COAGUCHEK XS/INR WAIVED
INR: 2.5 — AB (ref 0.9–1.1)
PROTHROMBIN TIME: 29.7 s

## 2016-07-08 NOTE — Telephone Encounter (Signed)
New message     Pt is having blood in urine w/clots , they want to know what to do with the blood thinner

## 2016-07-08 NOTE — Progress Notes (Signed)
  Subjective:   Patient ID: [name], male    DOB: 1940-06-25, 76 y.o.   MRN: [id number] CC: Shortness of Breath  HPI: [name] is a 76 y.o. male presenting for Shortness of Breath  Treated for flu last week after exposure Felt better until two days ago, having some sore throat in the morning Achy at times No fevers Appetite is fine  Remains very worried about INR, doesn't want to have a stroke with h/o afib. Also continues to express concern about cancer, has been pleased that he has felt so well for so long. Says he was told at original diagnosis that about this time he will have more symptoms and he is worried symptoms will start soon.  Relevant past medical, surgical, family and social history reviewed. Allergies and medications reviewed and updated. History  Smoking Status  . Former Smoker  . Packs/day: 0.00  . Years: 0.00  . Types: Cigarettes  . Quit date: 09/21/1973  Smokeless Tobacco  . Never Used   ROS: Per HPI   Objective:    BP 105/68   Pulse 70   Temp 97 F (36.1 C) (Oral)   Resp 18   Ht 5\' 11"  (1.803 m)   Wt 188 lb (85.3 kg)   SpO2 98%   BMI 26.22 kg/m   Wt Readings from Last 3 Encounters:  07/08/16 188 lb (85.3 kg)  07/01/16 190 lb (86.2 kg)  06/16/16 189 lb (85.7 kg)    Gen: NAD, alert, cooperative with exam, NCAT EYES: EOMI, no conjunctival injection, or no icterus ENT:  TMs slighlty pink b/l, OP without erythema LYMPH: no cervical LAD CV: NRRR, normal S1/S2 Resp: CTABL, no wheezes, normal WOB Abd: +BS, soft, NTND.  Ext: No edema, warm Neuro: Alert and oriented Skin: no rash  Assessment & Plan:  [name] was seen today for uri symptoms.  Diagnoses and all orders for this visit:  Acute URI Treated last week for flu after exposure and symptoms continues to have symptoms off and on Feeling fairly well this morning Appetite has been normal Continue symptom care Questions and concerns addressed, rtc prn  Chronic  anticoagulation INR 2.5, cont current dosing -     CoaguChek XS/INR Waived   Follow up plan: 4 weeks Brett Found, MD [place]

## 2016-07-08 NOTE — Telephone Encounter (Signed)
Received phone call from patient stating that after leaving this office he went to [place] to eat and went to the restroom.  Patient states that he had clots of blood in his urine and would like to know what to do.  Per Dr. Evette Doffing patient is to hold coumadin through out the weekend and contact urologist.  Patient verbalized understanding.

## 2016-07-08 NOTE — Telephone Encounter (Signed)
I spoke with the patient regarding blood clots he is seeing in his urine- this started today. His PCP has already advised him to hold warfarin over the weekend and follow up with urology. Per the patient, urology told him to call cardiology. Patient with bladder cancer. I advised I will review with Dr. Caryl Comes and call him back.  He is agreeable.

## 2016-07-18 IMAGING — DX DG CHEST 2V
2 series · 2 of 2 positions shown · non-contrast
Comparison: Chest radiograph June 11, 2014

CLINICAL DATA: Chest pain beginning at 6404 hours, with nausea.
Elbow pain. History of hypertension, ischemic cardiomyopathy,
coronary artery disease.

EXAM:
CHEST  2 VIEW

[chest pa]
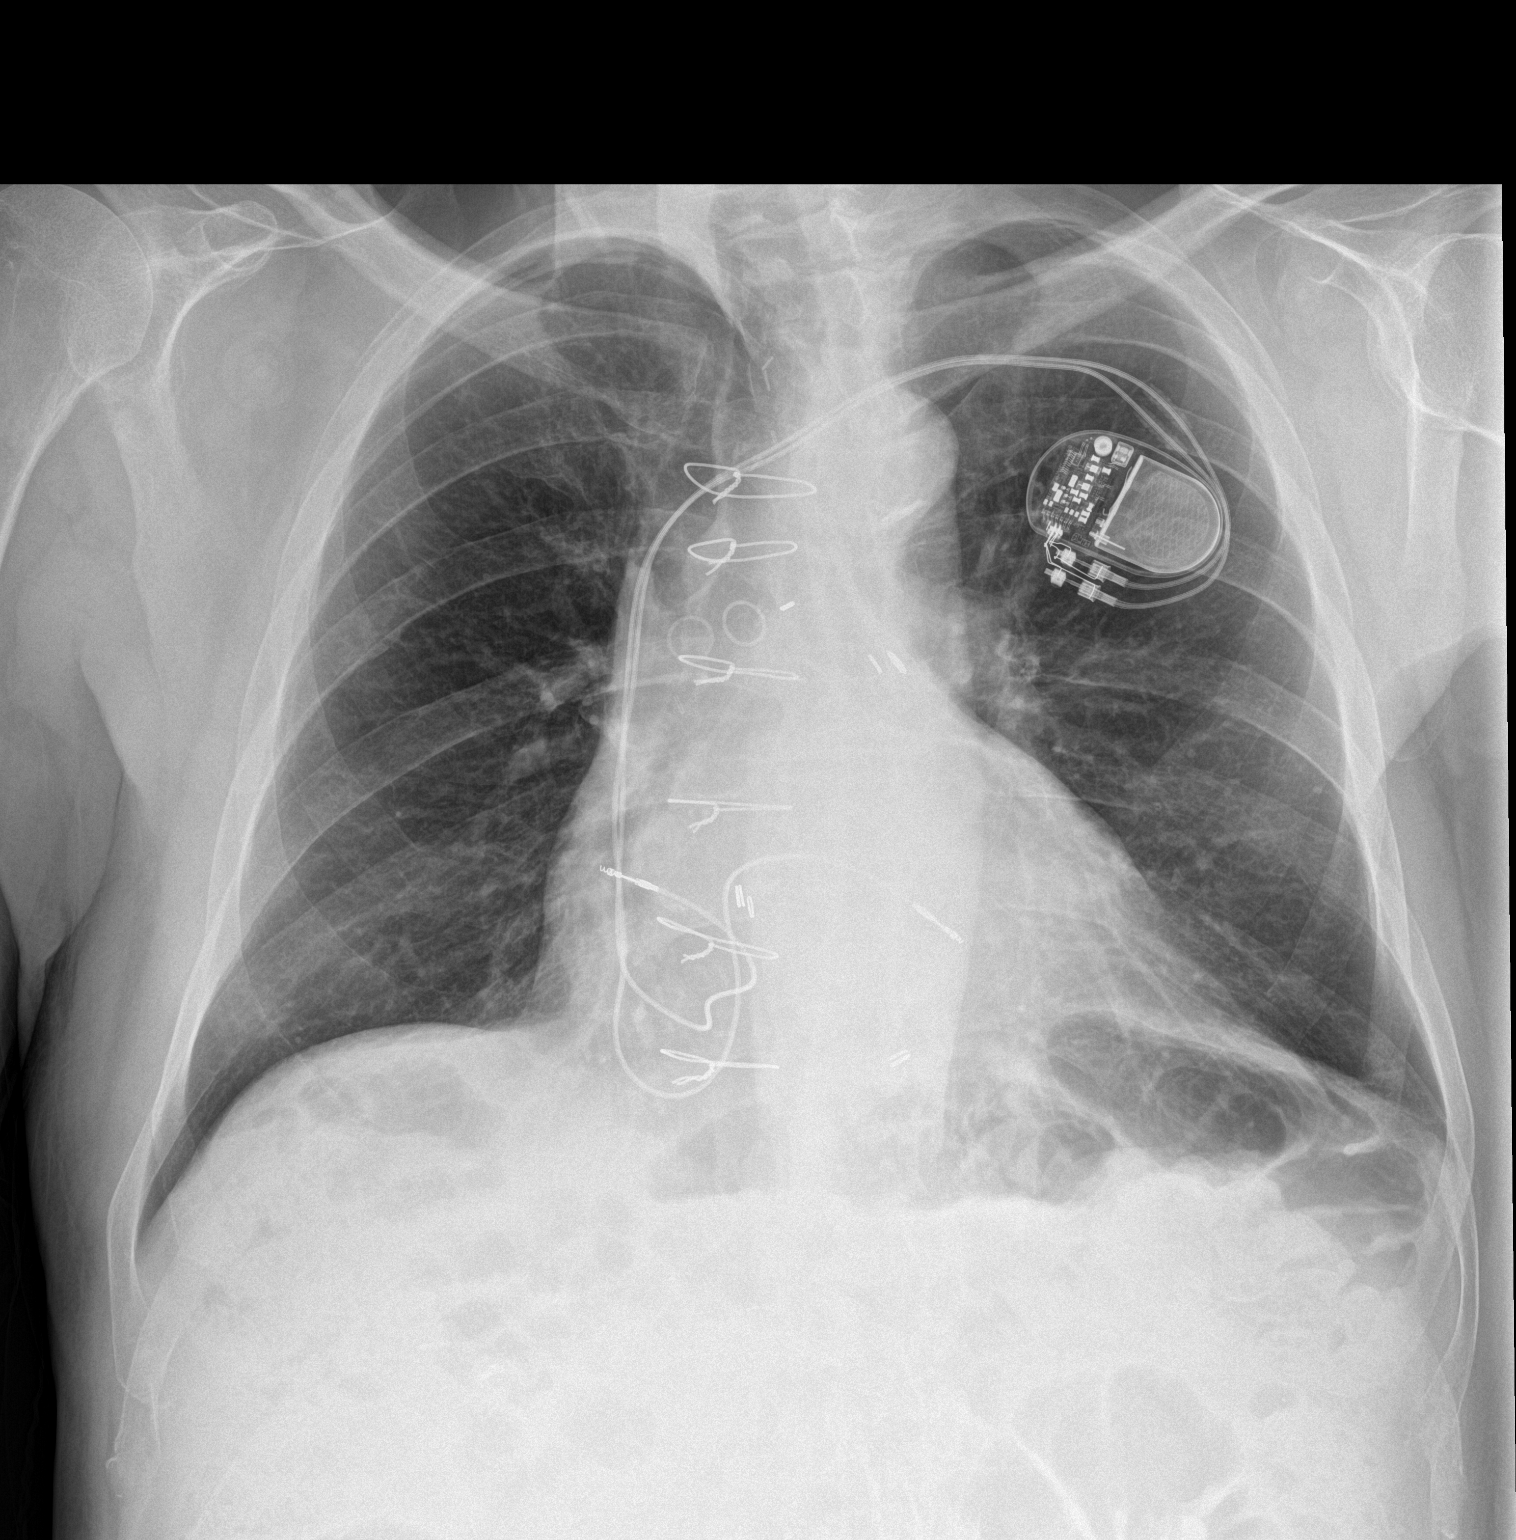

[chest lat]
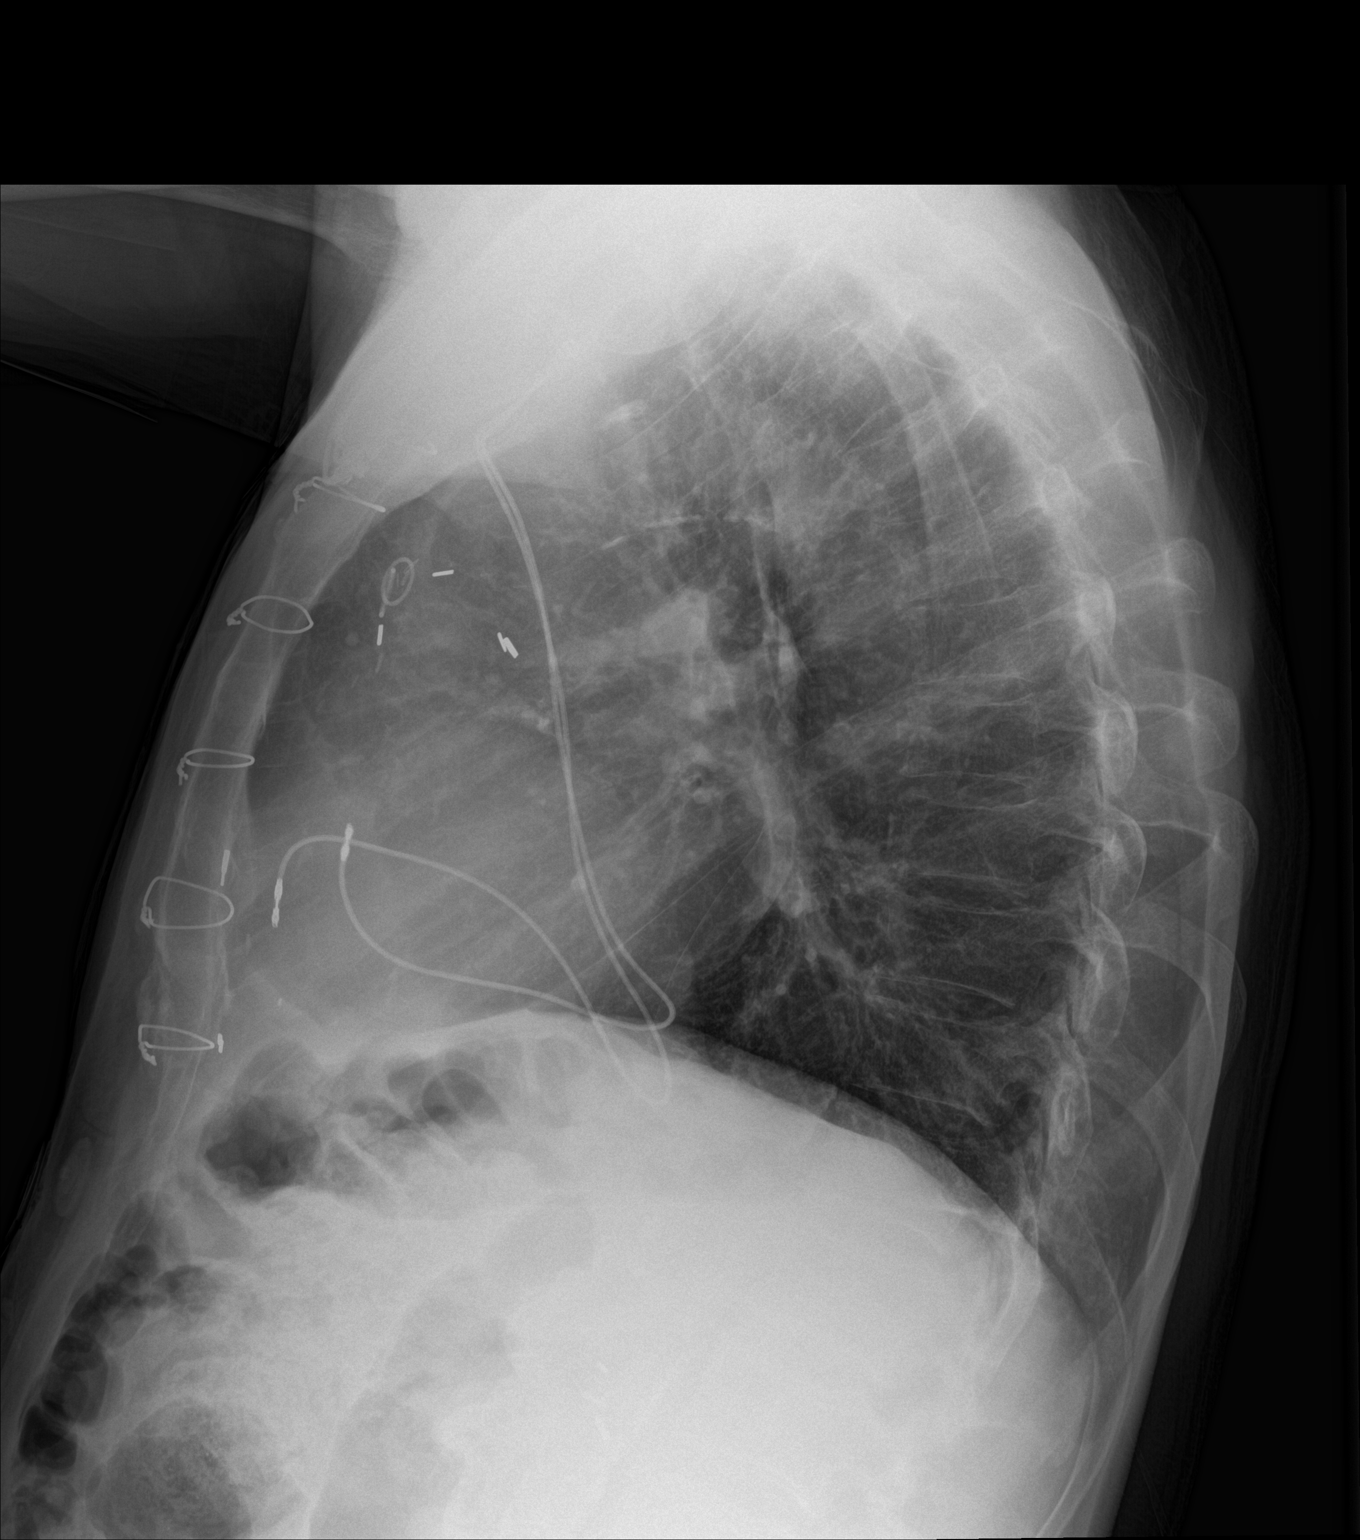

[2 of 2 positions shown; findings below may reference images not displayed]

FINDINGS: The cardiac silhouette is upper limits of normal in size and
unchanged. Status post median sternotomy for CABG. Dual lead LEFT
cardiac pacemaker in situ. Strandy densities LEFT lung base, with
mildly elevated LEFT hemidiaphragm is similar. No pleural effusion
or focal consolidation. No pneumothorax. Surgical clips in the
included right abdomen compatible with cholecystectomy. Patient is
mildly osteopenic.
IMPRESSION: Stable borderline cardiomegaly, LEFT lung base atelectasis/
scarring.

## 2016-07-27 ENCOUNTER — Other Ambulatory Visit: Payer: Self-pay | Admitting: *Deleted

## 2016-07-27 ENCOUNTER — Telehealth: Payer: Self-pay | Admitting: Pediatrics

## 2016-07-27 MED ORDER — OSELTAMIVIR PHOSPHATE 75 MG PO CAPS: 75.0000 mg | ORAL_CAPSULE | Freq: Every day | ORAL | 0 refills | Status: DC

## 2016-07-27 NOTE — Telephone Encounter (Signed)
Exposed to flu, 10 days of ppx tamiflu sent in

## 2016-07-27 NOTE — Telephone Encounter (Signed)
Tamiflu sent to pharmacy

## 2016-07-28 ENCOUNTER — Telehealth: Payer: Self-pay | Admitting: Pediatrics

## 2016-07-28 ENCOUNTER — Encounter: Payer: Self-pay | Admitting: Family Medicine

## 2016-07-28 ENCOUNTER — Ambulatory Visit (INDEPENDENT_AMBULATORY_CARE_PROVIDER_SITE_OTHER): Payer: Medicare Other | Admitting: Family Medicine

## 2016-07-28 ENCOUNTER — Ambulatory Visit (INDEPENDENT_AMBULATORY_CARE_PROVIDER_SITE_OTHER): Payer: Medicare Other

## 2016-07-28 VITALS — BP 120/68 | HR 69 | Temp 97.0°F | Ht 71.0 in | Wt 191.6 lb

## 2016-07-28 DIAGNOSIS — R05 Cough: Secondary | ICD-10-CM

## 2016-07-28 DIAGNOSIS — R059 Cough, unspecified: Secondary | ICD-10-CM

## 2016-07-28 LAB — VERITOR FLU A/B WAIVED
Influenza A: NEGATIVE
Influenza B: NEGATIVE

## 2016-07-28 NOTE — Telephone Encounter (Signed)
Patient given an appointment for 8:55am. Patient states that he took Tamiflu last yesterday and woke up with his fingers and lips tingling. He took 2 benadry. He also has a cough. Patient given an appointment for 8:55am with Wendi Snipes.

## 2016-07-28 NOTE — Progress Notes (Signed)
   HPI  Patient presents today here with cough.  Patient's plans for the last 2-3 days he's had cough, body aches, and congestion. He also has chest tightness. He does not have any shortness of breath.  He does complain of some fatigue, however this has been constant over the last few months.  Patient states that he's been trying off medication with some improvement. His daughter has recently been diagnosed with influenza, he started Tamiflu prophylactically one day ago. 3 hours after his first pill he developed finger tingling as well as lip tingling.  PMH: Smoking status noted ROS: Per HPI  Objective: BP 120/68   Pulse 69   Temp 97 F (36.1 C) (Oral)   Ht 5\' 11"  (1.803 m)   Wt 191 lb 9.6 oz (86.9 kg)   BMI 26.72 kg/m  Gen: NAD, alert, cooperative with exam HEENT: NCAT, oropharynx clear, TMs obscured by cerumen bilaterally, nares clear CV: RRR, good S1/S2, no murmur Resp: CTABL, no wheezes, non-labored Ext: No edema, warm Neuro: Alert and oriented, No gross deficits  Assessment and plan:  # Cough Likely viral URI, Negative for influenza today For now avoid contact with infected relative, No need to take tamiflu with side effects CXR appears clear- No CAP RTC with any concerns      Orders Placed This Encounter  Procedures  . Veritor Flu A/B Waived    Order Specific Question:   Source    Answer:   nasal  . DG Chest 2 View    Standing Status:   Future    Number of Occurrences:   1    Standing Expiration Date:   09/27/2017    Order Specific Question:   Reason for Exam (SYMPTOM  OR DIAGNOSIS REQUIRED)    Answer:   cough    Order Specific Question:   Preferred imaging location?    Answer:   Internal     Laroy Apple, MD [facility] 07/28/2016, 9:49 AM

## 2016-07-28 NOTE — Patient Instructions (Signed)
Great to see you!   

## 2016-07-28 NOTE — Telephone Encounter (Signed)
What symptoms do you have?allergic reaction  How long have you been sick? Since yesterday  Have you been seen for this problem? Not this time  If your provider decides to give you a prescription, which pharmacy would you like for it to be sent to? Wants a call   Patient informed that this information will be sent to the clinical staff for review and that they should receive a follow up call.

## 2016-08-01 ENCOUNTER — Telehealth: Payer: Self-pay | Admitting: Pediatrics

## 2016-08-01 NOTE — Telephone Encounter (Signed)
Patient called stating that he was exposed to the flu. Daughter has finished tamiflu patient is wanting to know if he can be around her now.  Informed patient that he can.  Patient also states that brother was exposed to the flu and has been taking tamiflu.  Patient is wanting to know if he should have symptoms by now.  Informed patient that he should.  Patient states that he feels fine now.

## 2016-08-05 ENCOUNTER — Ambulatory Visit (INDEPENDENT_AMBULATORY_CARE_PROVIDER_SITE_OTHER): Payer: Medicare Other | Admitting: Pharmacist

## 2016-08-05 ENCOUNTER — Encounter: Payer: Self-pay | Admitting: Pharmacist

## 2016-08-05 DIAGNOSIS — I482 Chronic atrial fibrillation, unspecified: Secondary | ICD-10-CM

## 2016-08-05 DIAGNOSIS — I4891 Unspecified atrial fibrillation: Secondary | ICD-10-CM

## 2016-08-05 LAB — COAGUCHEK XS/INR WAIVED
INR: 1.8 — AB (ref 0.9–1.1)
PROTHROMBIN TIME: 21.1 s

## 2016-08-09 ENCOUNTER — Other Ambulatory Visit: Payer: Self-pay | Admitting: *Deleted

## 2016-08-09 MED ORDER — ISOSORBIDE MONONITRATE ER 60 MG PO TB24: 60.0000 mg | ORAL_TABLET | Freq: Every day | ORAL | 3 refills | Status: AC

## 2016-08-09 NOTE — Telephone Encounter (Signed)
SCHEDULE OFFICE VISIT

## 2016-08-25 ENCOUNTER — Encounter: Payer: Self-pay | Admitting: Pediatrics

## 2016-08-25 ENCOUNTER — Ambulatory Visit (INDEPENDENT_AMBULATORY_CARE_PROVIDER_SITE_OTHER): Payer: Medicare Other | Admitting: Pediatrics

## 2016-08-25 VITALS — BP 128/83 | HR 73 | Temp 96.7°F | Ht 71.0 in | Wt 190.0 lb

## 2016-08-25 DIAGNOSIS — M25512 Pain in left shoulder: Secondary | ICD-10-CM

## 2016-08-25 DIAGNOSIS — M25511 Pain in right shoulder: Secondary | ICD-10-CM | POA: Diagnosis not present

## 2016-08-25 MED ORDER — METHOCARBAMOL 500 MG PO TABS: 500.0000 mg | ORAL_TABLET | Freq: Three times a day (TID) | ORAL | 0 refills | Status: AC | PRN

## 2016-08-25 NOTE — Progress Notes (Signed)
  Subjective:   Patient ID: [name], male    DOB: November 03, 1940, 76 y.o.   MRN: [id number] CC: soreness (Upper body) and Back Pain  HPI: [name] is a 76 y.o. male presenting for soreness (Upper body) and Back Pain  Bent over to pick up soap in the shower yesterday, didn't fall, felt like he stretched more than usual Has been sore since then he thinks Also wonders if he might have slept funny No known injury No fevers Normal appetite Otherwise feeling well Normal urination recently Remains on blood thinner  Relevant past medical, surgical, family and social history reviewed. Allergies and medications reviewed and updated. History  Smoking Status  . Former Smoker  . Packs/day: 0.00  . Years: 0.00  . Types: Cigarettes  . Quit date: 09/21/1973  Smokeless Tobacco  . Never Used   ROS: Per HPI   Objective:    BP 128/83   Pulse 73   Temp (!) 96.7 F (35.9 C) (Oral)   Ht 5\' 11"  (1.803 m)   Wt 190 lb (86.2 kg)   BMI 26.50 kg/m   Wt Readings from Last 3 Encounters:  08/25/16 190 lb (86.2 kg)  07/28/16 191 lb 9.6 oz (86.9 kg)  07/08/16 188 lb (85.3 kg)    Gen: NAD, alert, cooperative with exam, NCAT EYES: EOMI, no conjunctival injection, or no icterus ENT:  TMs pearly gray b/l, OP without erythema LYMPH: no cervical LAD CV: NRRR, normal S1/S2 Resp: CTABL, no wheezes, normal WOB Abd: +BS, soft, NTND Ext: No edema, warm Neuro: Alert and oriented MSK: ttp over top of shoulders L>R  Assessment & Plan:  Adalid was seen today for soreness and back pain.  Diagnoses and all orders for this visit:  Acute pain of both shoulders OK to take tylenol Can try below if soreness not improving Discussed may cause drowsiness, dont take before driving or with other medicines that may cause drowsiness -     methocarbamol (ROBAXIN) 500 MG tablet; Take 1 tablet (500 mg total) by mouth every 8 (eight) hours as needed for muscle spasms.   Follow up plan: Return in about 3  months (around 11/24/2016). Brett Found, MD [place]

## 2016-09-02 ENCOUNTER — Encounter: Payer: Self-pay | Admitting: Family

## 2016-09-02 ENCOUNTER — Ambulatory Visit (INDEPENDENT_AMBULATORY_CARE_PROVIDER_SITE_OTHER): Payer: Medicare Other | Admitting: Family

## 2016-09-02 ENCOUNTER — Encounter: Payer: Self-pay | Admitting: Pharmacist Clinician (PhC)/ Clinical Pharmacy Specialist

## 2016-09-02 VITALS — BP 108/59 | HR 75 | Temp 99.0°F | Ht 71.0 in | Wt 193.0 lb

## 2016-09-02 DIAGNOSIS — I482 Chronic atrial fibrillation, unspecified: Secondary | ICD-10-CM

## 2016-09-02 DIAGNOSIS — J209 Acute bronchitis, unspecified: Secondary | ICD-10-CM | POA: Diagnosis not present

## 2016-09-02 DIAGNOSIS — R6889 Other general symptoms and signs: Secondary | ICD-10-CM

## 2016-09-02 LAB — COAGUCHEK XS/INR WAIVED
INR: 1.7 — ABNORMAL HIGH (ref 0.9–1.1)
Prothrombin Time: 20.4 s

## 2016-09-02 LAB — VERITOR FLU A/B WAIVED
INFLUENZA A: NEGATIVE
INFLUENZA B: NEGATIVE

## 2016-09-02 MED ORDER — DOXYCYCLINE HYCLATE 100 MG PO TABS: 100.0000 mg | ORAL_TABLET | Freq: Two times a day (BID) | ORAL | 0 refills | Status: DC

## 2016-09-02 NOTE — Progress Notes (Addendum)
Subjective:    Patient ID: [name], male    DOB: 01-05-1941, 76 y.o.   MRN: [id number]  Sore Throat   Associated symptoms include coughing and ear pain. Pertinent negatives include no shortness of breath.  Cough  The current episode started in the past 7 days. The problem has been waxing and waning. The problem occurs every few minutes. The cough is productive of sputum. Associated symptoms include ear congestion, ear pain, a fever, myalgias, postnasal drip, rhinorrhea, a sore throat and wheezing. Pertinent negatives include no chills, nasal congestion or shortness of breath. The symptoms are aggravated by lying down. He has tried rest and OTC cough suppressant for the symptoms. The treatment provided mild relief.  Fever   Associated symptoms include coughing, ear pain, a sore throat and wheezing.      Review of Systems  Constitutional: Positive for fever. Negative for chills.  HENT: Positive for ear pain, postnasal drip, rhinorrhea and sore throat.   Respiratory: Positive for cough and wheezing. Negative for shortness of breath.   Musculoskeletal: Positive for myalgias.  All other systems reviewed and are negative.      Objective:   Physical Exam  Constitutional: He is oriented to person, place, and time. He appears well-developed and well-nourished. No distress.  HENT:  Head: Normocephalic.  Right Ear: External ear normal.  Left Ear: External ear normal.  Nose: Mucosal edema and rhinorrhea present.  Mouth/Throat: Posterior oropharyngeal erythema present.  Eyes: Pupils are equal, round, and reactive to light. Right eye exhibits no discharge. Left eye exhibits no discharge.  Neck: Normal range of motion. Neck supple. No thyromegaly present.  Cardiovascular: Normal rate, regular rhythm, normal heart sounds and intact distal pulses.   No murmur heard. Pulmonary/Chest: Effort normal. No respiratory distress. He has decreased breath sounds. He has no wheezes.  Dry  intermittent cough   Abdominal: Soft. Bowel sounds are normal. He exhibits no distension. There is no tenderness.  Musculoskeletal: Normal range of motion. He exhibits no edema or tenderness.  Neurological: He is alert and oriented to person, place, and time.  Skin: Skin is warm and dry. No rash noted. No erythema.  Psychiatric: He has a normal mood and affect. His behavior is normal. Judgment and thought content normal.  Vitals reviewed.    BP (!) 108/59   Pulse 75   Temp 99 F (37.2 C) (Oral)   Ht 5\' 11"  (1.803 m)   Wt 193 lb (87.5 kg)   BMI 26.92 kg/m      Assessment & Plan:  1. Chronic atrial fibrillation (HCC) - CoaguChek XS/INR Waived  2. Flu-like symptoms - Veritor Flu A/B Waived  3. Acute bronchitis, unspecified organism - Take meds as prescribed - Use a cool mist humidifier  -Use saline nose sprays frequently -Saline irrigations of the nose can be very helpful if done frequently.  * 4X daily for 1 week*  * Use of a nettie pot can be helpful with this. Follow directions with this* -Force fluids -For any cough or congestion  Use plain Mucinex- regular strength or max strength is fine   * Children- consult with Pharmacist for dosing -For fever or aces or pains- take tylenol or ibuprofen appropriate for age and weight.  * for fevers greater than 101 orally you may alternate ibuprofen and tylenol every  3 hours. -Throat lozenges if help -New toothbrush in 3 days - doxycycline (VIBRA-TABS) 100 MG tablet; Take 1 tablet (100 mg total) by mouth 2 (two)  times daily.  Dispense: 20 tablet; Refill: 0   Evelina Dun, FNP

## 2016-09-02 NOTE — Addendum Note (Signed)
Addended by: Evelina Dun A on: 09/02/2016 09:45 AM   Modules accepted: Orders

## 2016-09-02 NOTE — Patient Instructions (Signed)

## 2016-09-03 ENCOUNTER — Telehealth: Payer: Self-pay | Admitting: Pediatrics

## 2016-09-03 MED ORDER — ALBUTEROL SULFATE HFA 108 (90 BASE) MCG/ACT IN AERS: 2.0000 | INHALATION_SPRAY | Freq: Four times a day (QID) | RESPIRATORY_TRACT | 2 refills | Status: AC | PRN

## 2016-09-03 MED ORDER — BENZONATATE 200 MG PO CAPS: 200.0000 mg | ORAL_CAPSULE | Freq: Three times a day (TID) | ORAL | 1 refills | Status: DC | PRN

## 2016-09-03 MED ORDER — ALBUTEROL SULFATE HFA 108 (90 BASE) MCG/ACT IN AERS: 2.0000 | INHALATION_SPRAY | Freq: Four times a day (QID) | RESPIRATORY_TRACT | 2 refills | Status: DC | PRN

## 2016-09-03 NOTE — Telephone Encounter (Signed)
What symptoms do you have? COUGHING, WHEEZING  How long have you been sick?pt was up all night coughing and wheezing  Have you been seen for this problem? Yes yesterday by Alyse Low  If your provider decides to give you a prescription, which pharmacy would you like for it to be sent to? [facility] [place]   Patient informed that this information will be sent to the clinical staff for review and that they should receive a follow up call.

## 2016-09-03 NOTE — Telephone Encounter (Signed)
Patient aware.

## 2016-09-03 NOTE — Telephone Encounter (Signed)
Tessalon and albuterol Prescription sent to pharmacy, continue doxycyline Prescription sent to pharmacy

## 2016-09-07 ENCOUNTER — Ambulatory Visit (INDEPENDENT_AMBULATORY_CARE_PROVIDER_SITE_OTHER): Payer: Medicare Other

## 2016-09-07 ENCOUNTER — Encounter: Payer: Self-pay | Admitting: Pediatrics

## 2016-09-07 ENCOUNTER — Ambulatory Visit (INDEPENDENT_AMBULATORY_CARE_PROVIDER_SITE_OTHER): Payer: Medicare Other | Admitting: Pediatrics

## 2016-09-07 VITALS — BP 111/73 | HR 74 | Temp 97.0°F | Resp 20 | Ht 71.0 in | Wt 194.0 lb

## 2016-09-07 DIAGNOSIS — R059 Cough, unspecified: Secondary | ICD-10-CM

## 2016-09-07 DIAGNOSIS — R062 Wheezing: Secondary | ICD-10-CM | POA: Diagnosis not present

## 2016-09-07 DIAGNOSIS — Z7901 Long term (current) use of anticoagulants: Secondary | ICD-10-CM | POA: Diagnosis not present

## 2016-09-07 DIAGNOSIS — I4891 Unspecified atrial fibrillation: Secondary | ICD-10-CM

## 2016-09-07 DIAGNOSIS — R05 Cough: Secondary | ICD-10-CM | POA: Diagnosis not present

## 2016-09-07 LAB — COAGUCHEK XS/INR WAIVED
INR: 2.1 — AB (ref 0.9–1.1)
PROTHROMBIN TIME: 25 s

## 2016-09-07 MED ORDER — METHYLPREDNISOLONE 32 MG PO TABS: 32.0000 mg | ORAL_TABLET | Freq: Every day | ORAL | 0 refills | Status: DC

## 2016-09-07 MED ORDER — GUAIFENESIN-CODEINE 100-10 MG/5ML PO SOLN: 5.0000 mL | Freq: Three times a day (TID) | ORAL | 0 refills | Status: DC | PRN

## 2016-09-07 MED ORDER — SPACER/AERO CHAMBER MOUTHPIECE MISC: 1.0000 | Freq: Four times a day (QID) | 0 refills | Status: AC | PRN

## 2016-09-07 NOTE — Progress Notes (Signed)
  Subjective:   Patient ID: [name], male    DOB: 22-Jun-1940, 76 y.o.   MRN: [id number] CC: Cough and Back Pain ([place] )  HPI: [name] is a 76 y.o. male presenting for Cough and Back Pain ([place] )  Seen last week for flu-like symptoms, cough Started on doxycycline and albuterol for bronchitis Thinks breathing has been getting worse Smoked in his 75s, hasnt been a smoker since then Coughing a lot at night Having hard time using albuterol inhaler correctly per pt No fevers Continues to feel achey and bad Cough bothering him the most Eating some, appetite is down congested   Relevant past medical, surgical, family and social history reviewed. Allergies and medications reviewed and updated. History  Smoking Status  . Former Smoker  . Packs/day: 0.00  . Years: 0.00  . Types: Cigarettes  . Quit date: 09/21/1973  Smokeless Tobacco  . Never Used   ROS: Per HPI   Objective:    BP 111/73   Pulse 74   Temp 97 F (36.1 C) (Oral)   Resp 20   Ht 5\' 11"  (1.803 m)   Wt 194 lb (88 kg)   SpO2 100%   BMI 27.06 kg/m   Wt Readings from Last 3 Encounters:  09/07/16 194 lb (88 kg)  09/02/16 193 lb (87.5 kg)  08/25/16 190 lb (86.2 kg)    Gen: NAD, alert, cooperative with exam, NCAT EYES: EOMI, no conjunctival injection, or no icterus ENT:  TMs pearly gray b/l, OP without erythema LYMPH: no cervical LAD CV: RRR, normal S1/S2, no murmur Resp: moving air fair, diffuse wheezing with exhalation, comfortable WOB, speaking in complete sentences Abd: +BS, soft Ext: No edema, warm Neuro: Alert and oriented  Assessment & Plan:  [name] was seen today for cough and back pain.  Diagnoses and all orders for this visit:  Chronic anticoagulation Has been on doxycycline INR 2.1 today Recheck 2 weeks, cont same dosing -     CoaguChek XS/INR Waived  Atrial fibrillation, unspecified type (HCC) -     CoaguChek XS/INR Waived  Cough Bronchitis Wheezing Recent URI Treated for  bronchitis last week Cont doxycycline Use albuteorl QID WITH SPACER h/o itching with prednisone per chart, pt doesn't remember what happened, denies anaphylaxis Will try methylprednisolone PO x 3 days given diffuse wheezing Daughter at home today, needs to be seen for any reaction Wants to try cough medication with codeine. Discussed h/o itching reaction with similar medications. Pt to take first dose at least 4-6 hrs after first dose steroid. Any reaction needs to be seen. -     DG Chest 2 View; Future -     Spacer/Aero Chamber Mouthpiece MISC; 1 each by Does not apply route every 6 (six) hours as needed. -     methylPREDNISolone (MEDROL) 32 MG tablet; Take 1 tablet (32 mg total) by mouth daily. -     guaiFENesin-codeine 100-10 MG/5ML syrup; Take 5 mLs by mouth 3 (three) times daily as needed for cough.  Follow up plan: 4 weeks, sooner if needed Assunta Found, MD [place]

## 2016-09-08 ENCOUNTER — Telehealth: Payer: Self-pay | Admitting: Pediatrics

## 2016-09-08 NOTE — Telephone Encounter (Signed)
Patient states that the tessalon perls are not working so patient is going to take the codeine cough medication with following the directions on the bottle.

## 2016-09-10 ENCOUNTER — Encounter (HOSPITAL_COMMUNITY): Payer: Self-pay

## 2016-09-10 ENCOUNTER — Emergency Department (HOSPITAL_COMMUNITY)
Admission: EM | Admit: 2016-09-10 | Discharge: 2016-09-10 | Disposition: A | Discharge status: Home / Self Care | Payer: Medicare Other | Attending: Emergency Medicine | Admitting: Emergency Medicine

## 2016-09-10 ENCOUNTER — Emergency Department (HOSPITAL_COMMUNITY): Payer: Medicare Other

## 2016-09-10 DIAGNOSIS — Z87891 Personal history of nicotine dependence: Secondary | ICD-10-CM | POA: Diagnosis not present

## 2016-09-10 DIAGNOSIS — W19XXXA Unspecified fall, initial encounter: Secondary | ICD-10-CM

## 2016-09-10 DIAGNOSIS — Y9389 Activity, other specified: Secondary | ICD-10-CM | POA: Insufficient documentation

## 2016-09-10 DIAGNOSIS — I110 Hypertensive heart disease with heart failure: Secondary | ICD-10-CM | POA: Insufficient documentation

## 2016-09-10 DIAGNOSIS — Z79899 Other long term (current) drug therapy: Secondary | ICD-10-CM | POA: Insufficient documentation

## 2016-09-10 DIAGNOSIS — S0990XA Unspecified injury of head, initial encounter: Secondary | ICD-10-CM | POA: Diagnosis present

## 2016-09-10 DIAGNOSIS — I5022 Chronic systolic (congestive) heart failure: Secondary | ICD-10-CM | POA: Diagnosis not present

## 2016-09-10 DIAGNOSIS — I2510 Atherosclerotic heart disease of native coronary artery without angina pectoris: Secondary | ICD-10-CM | POA: Diagnosis not present

## 2016-09-10 DIAGNOSIS — Y999 Unspecified external cause status: Secondary | ICD-10-CM | POA: Insufficient documentation

## 2016-09-10 DIAGNOSIS — W01198A Fall on same level from slipping, tripping and stumbling with subsequent striking against other object, initial encounter: Secondary | ICD-10-CM | POA: Diagnosis not present

## 2016-09-10 DIAGNOSIS — Y929 Unspecified place or not applicable: Secondary | ICD-10-CM | POA: Insufficient documentation

## 2016-09-10 DIAGNOSIS — S0083XA Contusion of other part of head, initial encounter: Secondary | ICD-10-CM | POA: Insufficient documentation

## 2016-09-10 NOTE — ED Triage Notes (Signed)
Pt reports that he was moving from chair to bed and unsure how he fell, possibly due to dizziness. Hit left cheek and left forehead . Thinks hit cheek on edge on bed. Pt does not think is lost consciuosness

## 2016-09-10 NOTE — ED Provider Notes (Signed)
[facility] DEPT Provider Note   CSN: [id number] Arrival date & time: 09/10/16  6295  By signing my name below, I, Higinio Plan, attest that this documentation has been prepared under the direction and in the presence of Aradhya Shellenbarger, Corene Cornea, MD . Electronically Signed: Higinio Plan, Scribe. 09/10/2016. 10:08 AM.  History   Chief Complaint Chief Complaint  Patient presents with  . Fall   The history is provided by the patient. No language interpreter was used.   HPI Comments: [name] is a 76 y.o. male with PMHx of A-Fib on Warfarin, CAD, HTN, and stage IV bladder cancer, who presents to the Emergency Department for an evaluation s/p a fall that occurred ~2 hours PTA. Pt reports he was getting up from his chair to sit on his bed when he suddenly slipped and struck his left cheek on the hardwood floor. He notes associated bruising to his left cheek and an abrasion to his left scalp. He states he visited his PCP 3 days ago and was told his INR was 2.2. Pt denies any loss of consciousness, pain anywhere, dental pain, sensation of loose teeth, and numbness or weakness in his extremities.   Past Medical History:  Diagnosis Date  . A-fib (HCC)    a. chronic coumadin  . Anxiety   . Atrial flutter ([place])    a. 01/2011 s/p unsuccessful RFCA complicated by CHB req PPM.  . Bladder cancer ([place])   . Bladder mass 11/17/2015   Urothelial carcinoma until proven otherwise.  [name] Kitchen BPH (benign prostatic hyperplasia)    a. elevated PSA  . CAD (coronary artery disease)    a. 1991 s/p CABGx4;  b. redo CABG in 2007 (SVG->OM, VG->PDA->PLV);  c. 01/2011 Cath: LM 80ost, LAD 100p, LCX 100ost, OM1 100, RCA 100, LIMA->LAD ok, VG->PDA ok, VG->OM ok;  d. 03/2012 MV: EF 32% inf & inflat scar w/o ischemia; e. Lexi MV: EF 33%, inf infarct, no ischemia. f. lexiscan 06/12/2014 RCA scar, no ischemia, EF 26%  . CHB (complete heart block) ([place])    a. 01/2011: in setting of RFCA, s/p SJM 2110 [employer] DC PPM, ser # [id number].  [name] Kitchen  Chronic systolic CHF (congestive heart failure), NYHA class 2 ([place])    a. 06/2012 Echo: EF 25%, distal sept and apical AK, sev dil LV, mild LVH, mod dil LA.  . Depression    "since 4010-2725" (08/29/2012)  . Elevated PSA, less than 10 ng/ml   . Fibromyalgia   . GERD (gastroesophageal reflux disease)   . Hiatal hernia    a. 03/01/2013 EGD and esoph dil.  [name] Kitchen Hyperlipidemia   . Hypertension   . Hyponatremia 11/17/2015   Water intoxication versus SIADH.  . Ischemic cardiomyopathy    a. 06/2012 EF 25% by echo.  30% 2016.   [name] Kitchen Kidney cysts    a. bilateral  . neuroendocrine   . Pacemaker    explanted 06/15/15 >>> CRT-D STJ device, Dr. Caryl Comes  . Panic attacks   . Skin cancer    'above left eyebrow; tip of my nose" (08/29/2012)    Patient Active Problem List   Diagnosis Date Noted  . 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 Malignant neoplasm of anterior wall of bladder ([place]) 02/03/2016  . Malignant tumor of vault of bladder ([place]) 01/01/2016  . Constipation 12/21/2015  . Elevated PSA 11/17/2015  . Chronic systolic congestive heart failure ([place]) 06/15/2015  . Bilateral calf pain 03/11/2015  . Neck pain 03/11/2015  . Benign non-nodular prostatic  hyperplasia with lower urinary tract symptoms 05/12/2014  . Claudication of gluteal region ([place]) 01/10/2014  . Ventral hernia 01/23/2013  . Angina pectoris ([place]) 08/30/2012  . Chronic anticoagulation   . Umbilical hernia 83/41/9622  . CAD (coronary artery disease) 10/20/2011  . Atrial fibrillation-Permanent 05/31/2011  . Complete heart block ([place]) 05/31/2011  . Biventricular   defibrillator St Jude  05/31/2011  . Hx of CABG 09/23/2010  . Ischemic cardiomyopathy   . Hypertension   . Hyperlipidemia   . GERD (gastroesophageal reflux disease)   . Anxiety   . Fibromyalgia     Past Surgical History:  Procedure Laterality Date  . ATRIAL FLUTTER ABLATION  01/2011   "didn't work" (08/29/2012)  . CARDIAC CATHETERIZATION  10/30/2009    Grafts patent. Mild to moderate LV dysfunction. EF 45%. Managed medically.   [name] Kitchen CARDIAC CATHETERIZATION     multiple  . CARDIAC CATHETERIZATION N/A 04/28/2015   Procedure: Left Heart Cath and Cors/Grafts Angiography;  Surgeon: Belva Crome, MD;  Location: [facility];  Service: Cardiovascular;  Laterality: N/A;  . CARDIOVERSION  05/25/2012   Procedure: CARDIOVERSION;  Surgeon: Darlin Coco, MD;  Location: [facility];  Service: Cardiovascular;  Laterality: N/A;  . CATARACT EXTRACTION W/ INTRAOCULAR LENS  IMPLANT, BILATERAL  2012  . CHOLECYSTECTOMY  2007  . CORONARY ARTERY BYPASS GRAFT  08/27/1989   CABG X 5  . CORONARY ARTERY BYPASS GRAFT  06/2005   CABG X 3  . EP IMPLANTABLE DEVICE N/A 06/15/2015   STJ, CRT-D, Dr. Caryl Comes  . EXCISIONAL HEMORRHOIDECTOMY  ~ 1980  . [place]   "growth on the bone taken off; not cancer" (08/29/2012)  . INGUINAL HERNIA REPAIR  1960's   "left I think"  . INGUINAL HERNIA REPAIR Right 10/12/2015   Procedure: RIGHT INGUINAL HERNIA REPAIR WITH MESH;  Surgeon: Jackolyn Confer, MD;  Location: [facility];  Service: General;  Laterality: Right;  . INSERT / REPLACE / REMOVE PACEMAKER  01/2011   initial placement  . INSERTION OF MESH Right 10/12/2015   Procedure: INSERTION OF MESH, RIGHT INGUINAL HERNIA;  Surgeon: Jackolyn Confer, MD;  Location: [facility];  Service: General;  Laterality: Right;  . SKIN CANCER EXCISION     'above left eyebrow; tip of my nose" (08/29/2012)  . TONSILLECTOMY  1960    Home Medications    Prior to Admission medications   Medication Sig Start Date End Date Taking? Authorizing Provider  acetaminophen (TYLENOL) 500 MG tablet Take 500 mg by mouth every 8 (eight) hours as needed for mild pain.     [provider]  albuterol (PROVENTIL HFA;VENTOLIN HFA) 108 (90 Base) MCG/ACT inhaler Inhale 2 puffs into the lungs every 6 (six) hours as needed for wheezing or shortness of breath. 09/03/16   Sharion Balloon, FNP  ALPRAZolam  Duanne Moron) 1 MG tablet TAKE ONE TABLET 3 TIMES A DAY AS NEEDED. 07/01/16   Eustaquio Maize, MD  benzonatate (TESSALON) 200 MG capsule Take 1 capsule (200 mg total) by mouth 3 (three) times daily as needed. 09/03/16   Evelina Dun A, FNP  carvedilol (COREG) 6.25 MG tablet Take 1 tablet (6.25 mg total) by mouth 2 (two) times daily. 06/08/16   Minus Breeding, MD  doxepin (SINEQUAN) 25 MG capsule Take 25 mg (1 capsule) by mouth in the morning and 50 mg (2 capsules) by mouth at night. Patient taking differently: Take 25-50 mg by mouth 2 (two) times daily. Take 25 mg (1 capsule) by  mouth in the morning and 50 mg (2 capsules) by mouth at night. 11/12/15   Eustaquio Maize, MD  doxycycline (VIBRA-TABS) 100 MG tablet Take 1 tablet (100 mg total) by mouth 2 (two) times daily. 09/02/16   Evelina Dun A, FNP  fluticasone (FLONASE) 50 MCG/ACT nasal spray Place 2 sprays into both nostrils daily. 04/30/15   Sharion Balloon, FNP  furosemide (LASIX) 20 MG tablet Take 1 tablet (20 mg total) by mouth daily. 11/20/15   Rexene Alberts, MD  gabapentin (NEURONTIN) 100 MG capsule Take 100 mg by mouth 4 (four) times daily.    [provider]  guaiFENesin (MUCINEX) 600 MG 12 hr tablet Take 600 mg by mouth 2 (two) times daily as needed for cough or to loosen phlegm. Reported on 11/12/2015    [provider]  guaiFENesin-codeine 100-10 MG/5ML syrup Take 5 mLs by mouth 3 (three) times daily as needed for cough. 09/07/16   Eustaquio Maize, MD  hydroxypropyl methylcellulose / hypromellose (ISOPTO TEARS / GONIOVISC) 2.5 % ophthalmic solution Place 1 drop into both eyes 3 (three) times daily as needed for dry eyes.    [provider]  isosorbide mononitrate (IMDUR) 60 MG 24 hr tablet Take 1 tablet (60 mg total) by mouth daily. SCHEDULE APPT FOR FUTURE REFILLS 08/09/16   Minus Breeding, MD  JANTOVEN 5 MG tablet Take 1 tablet (5 mg total) by mouth every evening. 06/20/16   Eustaquio Maize, MD  lisinopril  (PRINIVIL,ZESTRIL) 5 MG tablet Take 1 tablet (5 mg total) by mouth every other day. 11/13/15   Minus Breeding, MD  loratadine (CLARITIN) 10 MG tablet Take 10 mg by mouth daily.    [provider]  methocarbamol (ROBAXIN) 500 MG tablet Take 1 tablet (500 mg total) by mouth every 8 (eight) hours as needed for muscle spasms. 08/25/16   Eustaquio Maize, MD  methylPREDNISolone (MEDROL) 32 MG tablet Take 1 tablet (32 mg total) by mouth daily. 09/07/16   Eustaquio Maize, MD  nitroGLYCERIN (NITROSTAT) 0.4 MG SL tablet DISSOLVE ONE TABLET UNDER THE TONGUE EVERY 5 MINUTES AS NEEDED FOR CHEST PAIN.  DO NOT EXCEED A TOTAL OF 3 DOSES IN 15 MINUTES 07/01/16   Eustaquio Maize, MD  omeprazole (PRILOSEC) 20 MG capsule Take 1 capsule (20 mg total) by mouth 2 (two) times daily. 03/25/13   Darlin Coco, MD  polyethylene glycol (MIRALAX / GLYCOLAX) packet Take 17 g by mouth daily.     [provider]  rosuvastatin (CRESTOR) 10 MG tablet Take 1 tablet (10 mg total) by mouth daily. 06/08/16   Minus Breeding, MD  Spacer/Aero Chamber Mouthpiece MISC 1 each by Does not apply route every 6 (six) hours as needed. 09/07/16   Eustaquio Maize, MD   Family History Family History  Problem Relation Age of Onset  . Stroke Mother   . Heart attack Father        died @ 24, mult MI's.  [name] Kitchen Heart disease Father   . Hypertension Father   . Diabetes Sister     Social History Social History  Substance Use Topics  . Smoking status: Former Smoker    Packs/day: 0.00    Years: 0.00    Types: Cigarettes    Quit date: 09/21/1973  . Smokeless tobacco: Never Used  . Alcohol use No   Allergies   Hydrocodone-acetaminophen; Levofloxacin; Prednisone; Sulfa drugs cross reactors; Ultram [tramadol hcl]; Xarelto [rivaroxaban]; and Zithromax [azithromycin]  Review of Systems Review  of Systems  HENT: Negative for dental problem.   Musculoskeletal: Negative for arthralgias.  Skin: Positive for wound.  Neurological:  Negative for syncope, weakness and numbness.   Physical Exam Updated Vital Signs BP 131/82 (BP Location: Right Arm)   Pulse 73   Temp 97.7 F (36.5 C) (Oral)   Resp 16   Wt 194 lb (88 kg)   SpO2 98%   BMI 27.06 kg/m   Physical Exam  Constitutional: He is oriented to person, place, and time. He appears well-developed and well-nourished.  HENT:  Head: Normocephalic and atraumatic.  Both TMs are partially impacted by cerumen. No bleeding. Bruising noted to left cheek and small abrasion to his left scalp.   Eyes: EOM are normal.  Neck: Normal range of motion.  Cardiovascular: Normal rate, regular rhythm, normal heart sounds and intact distal pulses.   Pulmonary/Chest: Effort normal and breath sounds normal. No respiratory distress.  Abdominal: Soft. He exhibits no distension. There is no tenderness.  Musculoskeletal: Normal range of motion.  No cervical spine tenderness, thoracic spine tenderness or Lumbar spine tenderness.  No tenderness or pain with palpation and full ROM of all joints in upper and lower extremities.  No ecchymosis or other signs of trauma on back or extremities.  No Pain with AP or lateral compression of ribs.  No Paracervical ttp, paraspinal ttp   Neurological: He is alert and oriented to person, place, and time.  Cranial nerves intact. Upper motor and sensation, lower motor and sensation, and mental status are all intact with no deficits.   Skin: Skin is warm and dry.  Psychiatric: He has a normal mood and affect. Judgment normal.  Nursing note and vitals reviewed.  ED Treatments / Results  DIAGNOSTIC STUDIES:  Oxygen Saturation is 98% on RA, normal by my interpretation.    COORDINATION OF CARE:  9:53 AM Discussed treatment plan with pt at bedside and pt agreed to plan.  Labs (all labs ordered are listed, but only abnormal results are displayed) Labs Reviewed - No data to display  EKG  EKG Interpretation  Date/Time:  Saturday Sep 10 2016 09:45:11  EDT Ventricular Rate:  75 PR Interval:    QRS Duration: 197 QT Interval:  489 QTC Calculation: 547 R Axis:   -137 Text Interpretation:  Ventricular-paced rhythm No further analysis attempted due to paced rhythm No significant change since last tracing Confirmed by [facility] MD, Corene Cornea [phone]) on 09/10/2016 11:33:25 AM       Radiology Ct Head Wo Contrast  Result Date: 09/10/2016 CLINICAL DATA:  Fall this morning at, hip left cheek, left scout laceration, initial encounter. EXAM: CT HEAD WITHOUT CONTRAST CT MAXILLOFACIAL WITHOUT CONTRAST TECHNIQUE: Multidetector CT imaging of the head and maxillofacial structures were performed using the standard protocol without intravenous contrast. Multiplanar CT image reconstructions of the maxillofacial structures were also generated. COMPARISON:  CT head 03/10/2016. FINDINGS: CT HEAD FINDINGS Brain: No evidence of an acute infarct, acute hemorrhage, mass lesion, mass effect or hydrocephalus. Mild atrophy. Vascular: No hyperdense vessel or unexpected calcification. Skull: No fracture. Other: None. CT MAXILLOFACIAL FINDINGS Osseous: No fracture or mandibular dislocation. No destructive process. Orbits: Negative. No traumatic or inflammatory finding. Sinuses: No air-fluid levels. Retention cyst or polyp in the left maxillary sinus. Mastoid air cells are clear. Soft tissues: Negative. IMPRESSION: 1. No evidence of acute intracranial trauma or facial fracture. 2. Mild atrophy. Electronically Signed   By: Lorin Picket M.D.   On: 09/10/2016 10:56   Ct Maxillofacial Wo  Contrast  Result Date: 09/10/2016 CLINICAL DATA:  Fall this morning at, hip left cheek, left scout laceration, initial encounter. EXAM: CT HEAD WITHOUT CONTRAST CT MAXILLOFACIAL WITHOUT CONTRAST TECHNIQUE: Multidetector CT imaging of the head and maxillofacial structures were performed using the standard protocol without intravenous contrast. Multiplanar CT image reconstructions of the maxillofacial  structures were also generated. COMPARISON:  CT head 03/10/2016. FINDINGS: CT HEAD FINDINGS Brain: No evidence of an acute infarct, acute hemorrhage, mass lesion, mass effect or hydrocephalus. Mild atrophy. Vascular: No hyperdense vessel or unexpected calcification. Skull: No fracture. Other: None. CT MAXILLOFACIAL FINDINGS Osseous: No fracture or mandibular dislocation. No destructive process. Orbits: Negative. No traumatic or inflammatory finding. Sinuses: No air-fluid levels. Retention cyst or polyp in the left maxillary sinus. Mastoid air cells are clear. Soft tissues: Negative. IMPRESSION: 1. No evidence of acute intracranial trauma or facial fracture. 2. Mild atrophy. Electronically Signed   By: Lorin Picket M.D.   On: 09/10/2016 10:56    Procedures Procedures (including critical care time)  Medications Ordered in ED Medications - No data to display  Initial Impression / Assessment and Plan / ED Course  I have reviewed the triage vital signs and the nursing notes.  Pertinent labs & imaging results that were available during my care of the patient were reviewed by me and considered in my medical decision making (see chart for details).     Patient with a likely mechanical fall. Hit the front of his head and face without loss of consciousness. Has been acting normally without vomiting since then. His exam is benign as documented above. CT head normal no evidence of facial fractures on CT either. No worrisome findings for intracranial injury. He is stable for discharge.  I personally performed the services described in this documentation, which was scribed in my presence. The recorded information has been reviewed and is accurate.   Final Clinical Impressions(s) / ED Diagnoses   Final diagnoses:  Fall, initial encounter  Contusion of face, initial encounter    New Prescriptions New Prescriptions   No medications on file     Alwyn Cordner, Corene Cornea, MD 09/10/16 1133

## 2016-09-12 ENCOUNTER — Encounter: Payer: Medicare Other | Admitting: Pharmacist

## 2016-09-13 ENCOUNTER — Telehealth: Payer: Self-pay | Admitting: Pediatrics

## 2016-09-14 NOTE — Telephone Encounter (Signed)
Aware, Dr. Evette Doffing will be primary pcp.

## 2016-09-14 NOTE — Telephone Encounter (Signed)
Yes, is he starting back on hospice now? My understanding from the pt was that he had stopped hospice in last few weeks for a procedure.

## 2016-09-15 ENCOUNTER — Encounter: Payer: Medicare Other | Admitting: Pharmacist

## 2016-09-23 ENCOUNTER — Ambulatory Visit: Payer: Medicare Other | Admitting: Family Medicine

## 2016-09-27 ENCOUNTER — Telehealth: Payer: Self-pay | Admitting: Cardiology

## 2016-09-27 ENCOUNTER — Encounter: Payer: Self-pay | Admitting: Pediatrics

## 2016-09-27 NOTE — Telephone Encounter (Signed)
We do not manage patient's Coumadin and never received any clearance request prior to his procedure. Suspect pt should have resumed his Coumadin right after his procedure. Will route to managing provider, Cherre Robins PharmD to follow up with patient. Also called pt and advised him to contact PCP.

## 2016-09-27 NOTE — Telephone Encounter (Signed)
Will route to coumadin clinic.

## 2016-09-27 NOTE — Telephone Encounter (Signed)
New message      Pt c/o medication issue:  1. Name of Medication:  jantoven 2. How are you currently taking this medication (dosage and times per day)? 5mg  daily 3. Are you having a reaction (difficulty breathing--STAT)?  no 4. What is your medication issue? Pt has been off medication since may 13th. He has bladder cancer and had surgery.  Pt want to know if he can restart this medication?  Please advise

## 2016-09-27 NOTE — Telephone Encounter (Signed)
Call patient.  He states that Dr Jens Som would like him to restart warfarin.  Recommend that he restart at low dose - take warfarin 5mg  and take 1/2 tablet daily.

## 2016-09-27 NOTE — Telephone Encounter (Signed)
Per patient's PCP he can remain off warfarin / [name] if he would like.  We have discussed pros and cons of continued anticoagulation and recommended discontinuing anticoagulation in past but patient wanted to continue warfarin.

## 2016-10-05 ENCOUNTER — Telehealth: Payer: Self-pay | Admitting: Internal Medicine

## 2016-10-05 NOTE — Telephone Encounter (Signed)
New message    Pt is calling to speak about his bladder cancer and blood thinners. He states he is having blood and clots in his catheter and urine bags. He wants to know if he should stop taking the blood thinner or what needs to happen.

## 2016-10-05 NOTE — Telephone Encounter (Signed)
Returned call to patient-reports he is having blood and blood clots in his urine and in his catheter bag.  Wondering if he should stop warfarin-reports Dr. Caryl Comes instructed to call him if this occurred again as he may need to stop this medication.   PCP manages INR but reports Dr. Caryl Comes started him on this medication.     Routed to Dr. Caryl Comes for recommendations.  Patient aware and verbalized understanding.

## 2016-10-06 NOTE — Telephone Encounter (Signed)
Per Dr. Caryl Comes, he called and spoke with the patient regarding his blood thinner. Patient having some clots that he is passing in his catheter bag. Per Dr. Caryl Comes, he advised the patient if urology has not advised him to discontinue his warfarin, then he recommends that the patient continue this at this time. He is due for an INR check next week.

## 2016-10-07 ENCOUNTER — Telehealth: Payer: Self-pay | Admitting: Pharmacist

## 2016-10-07 ENCOUNTER — Ambulatory Visit (INDEPENDENT_AMBULATORY_CARE_PROVIDER_SITE_OTHER): Payer: Medicare Other | Admitting: Pharmacist

## 2016-10-07 ENCOUNTER — Encounter: Payer: Self-pay | Admitting: Pharmacist Clinician (PhC)/ Clinical Pharmacy Specialist

## 2016-10-07 DIAGNOSIS — I482 Chronic atrial fibrillation, unspecified: Secondary | ICD-10-CM

## 2016-10-07 DIAGNOSIS — I4891 Unspecified atrial fibrillation: Secondary | ICD-10-CM | POA: Diagnosis not present

## 2016-10-07 LAB — COAGUCHEK XS/INR WAIVED
INR: 1.2 — AB (ref 0.9–1.1)
PROTHROMBIN TIME: 13.9 s

## 2016-10-07 NOTE — Telephone Encounter (Signed)
[facility] if pt should or should not be on warfarin. There was no note in the chart saying to stop the warfarin. They said that decision should be left to his PCP, who last said that he should continue

## 2016-10-07 NOTE — Telephone Encounter (Signed)
Talked to wife. Told her that Urology said that decision to stay on warfarin should be left to PCP and since Dr. Caryl Comes wants him to continue then he will continue to take warfarin to reduce risk of stroke.

## 2016-10-07 NOTE — Patient Instructions (Signed)
Anticoagulation Warfarin Dose Instructions as of 10/07/2016      Dorene Grebe Tue Wed Thu Fri Sat   New Dose 2.5 mg 5 mg 2.5 mg 2.5 mg 2.5 mg 5 mg 2.5 mg    Description   Take a whole tablet today (Friday, 10/07/16). Then take warfarin 5mg  dose - take 1/2 tablet everyday except 1 full tablet on Monday and Friday. Will get Hospice nurse to check INR.  INR was 1.2 today (goal 1.5-2)

## 2016-10-14 ENCOUNTER — Other Ambulatory Visit: Payer: Self-pay | Admitting: Pediatrics

## 2016-10-14 ENCOUNTER — Telehealth: Payer: Self-pay | Admitting: Pediatrics

## 2016-10-14 ENCOUNTER — Telehealth: Payer: Self-pay | Admitting: Pharmacist

## 2016-10-14 DIAGNOSIS — I4891 Unspecified atrial fibrillation: Secondary | ICD-10-CM

## 2016-10-14 DIAGNOSIS — B372 Candidiasis of skin and nail: Secondary | ICD-10-CM

## 2016-10-14 LAB — POCT INR: INR: 1.4

## 2016-10-14 NOTE — Telephone Encounter (Signed)
Patient has bladder cancer with some hematuria.  He has notice only a little over the last week in catheter bag and none currently.  INR has increased from 1.2 to 1.4 over the last week.   Will continue current warfarin dose of 5mg  M and F.  2.5mg  all other days.  INR goal 1.5 to 2.0 Recheck INR 1 week. Notified patient and hospice nurse Cassandra of above results and recommendations.

## 2016-10-14 NOTE — Telephone Encounter (Signed)
Pt was given Cephalexin at [facility] wanted to know if med was listed as allergy Medication not listed as allergy

## 2016-10-21 ENCOUNTER — Telehealth: Payer: Self-pay | Admitting: *Deleted

## 2016-10-21 NOTE — Telephone Encounter (Signed)
INR 1.6. Call hospice and give new orders

## 2016-10-21 NOTE — Telephone Encounter (Signed)
Talked to [place] Loss adjuster, chartered) with Hospice. Instructed her to keep warfarin at same dose of 5mg  M and F, 2.5mg  all other days.  INR reported was 1.6 (up from 1.4 last week)

## 2016-10-27 ENCOUNTER — Ambulatory Visit (INDEPENDENT_AMBULATORY_CARE_PROVIDER_SITE_OTHER): Payer: Medicare Other | Admitting: Pharmacist

## 2016-10-27 ENCOUNTER — Telehealth: Payer: Self-pay | Admitting: *Deleted

## 2016-10-27 DIAGNOSIS — I4891 Unspecified atrial fibrillation: Secondary | ICD-10-CM

## 2016-10-27 LAB — POCT INR: INR: 1.6

## 2016-10-27 NOTE — Telephone Encounter (Signed)
[name] with Hospice called to inform pt's INR results today INR  1.6  Pt is currently taking 2.5mg  on Monday and Friday 5mg  all other days  Please advise

## 2016-10-27 NOTE — Telephone Encounter (Signed)
Spoke with patient and verified warfarin dose - he was taking same as our records indicate in last anticoagulation note 5mg  on Mondays and Fridays.  2.5mg  all other days.  Recheck INR in 2 weeks.   Hospice notified of above and order.

## 2016-11-09 ENCOUNTER — Ambulatory Visit (INDEPENDENT_AMBULATORY_CARE_PROVIDER_SITE_OTHER): Payer: Medicare Other | Admitting: Pharmacist

## 2016-11-09 ENCOUNTER — Telehealth: Payer: Self-pay | Admitting: *Deleted

## 2016-11-09 DIAGNOSIS — I4891 Unspecified atrial fibrillation: Secondary | ICD-10-CM

## 2016-11-09 LAB — POCT INR: INR: 1.8

## 2016-11-09 NOTE — Telephone Encounter (Signed)
Patient's INR goal is 1.5 to 2.0.  Recommend current warfarin dose - warfarin 5mg  tablets - take 1 tablet on Mondays and Fridays.  Take 1/2 tablet on all other days.  Recommend recheck INR in 2 weeks.

## 2016-11-09 NOTE — Telephone Encounter (Signed)
Per Pearson Forster with Hospice INR 1.8  Please review and advise

## 2016-11-10 ENCOUNTER — Telehealth: Payer: Self-pay | Admitting: Pediatrics

## 2016-11-10 NOTE — Telephone Encounter (Signed)
Patient called in this morning and spoke to triage nurse with concerns of increase hematuria.  He spoke with urologist and they recommended that he continue warfarin.  Hospice nurse is going to go out and check his foley catheter.   Recommend check INR tomorrow, July 13th, 2018

## 2016-11-10 NOTE — Telephone Encounter (Signed)
Incoming call from pt Pt states he has blood in his urostomy bag Per Dr Evette Doffing, pt is to stop Warfarin and contact Dr Rosana Hoes Also contacted Hospice nurse to go out and do assessment Pt verbalizes understanding

## 2016-11-10 NOTE — Telephone Encounter (Signed)
HH going to check on pt. Last INR 1.8. Had blood clot obstructing foley in past, urology wants him to stay on warfarin. Recheck INR tomorrow. Pt feeling well otherwise.

## 2016-11-15 ENCOUNTER — Other Ambulatory Visit: Payer: Self-pay | Admitting: Pediatrics

## 2016-11-15 ENCOUNTER — Ambulatory Visit (INDEPENDENT_AMBULATORY_CARE_PROVIDER_SITE_OTHER): Payer: Medicare Other | Admitting: Pharmacist

## 2016-11-15 ENCOUNTER — Telehealth: Payer: Self-pay | Admitting: Pharmacist

## 2016-11-15 DIAGNOSIS — I4891 Unspecified atrial fibrillation: Secondary | ICD-10-CM

## 2016-11-15 DIAGNOSIS — R609 Edema, unspecified: Secondary | ICD-10-CM

## 2016-11-15 LAB — POCT INR: INR: 1.4

## 2016-11-15 NOTE — Telephone Encounter (Signed)
[name] - nurse with [facility] left message on VM of clinical pharmacist.  He checked INR in home today for [name] and was 1.4. Patient missed dose once last week.  Goal INR is 1.5 to 2.0.  Recommend take extra 1/2 tablet today. Then resume usual dose of 5mg  Mondays and Fridays.  Take 2.5mg  all other days.  Patient notified of results and recommendations. Left message with Ron to recheck INR in 1 week.

## 2016-11-18 ENCOUNTER — Other Ambulatory Visit: Payer: Self-pay | Admitting: *Deleted

## 2016-11-18 MED ORDER — JANTOVEN 5 MG PO TABS: 5.0000 mg | ORAL_TABLET | Freq: Every evening | ORAL | 0 refills | Status: DC

## 2016-11-21 LAB — POCT INR: INR: 1.7

## 2016-11-23 ENCOUNTER — Encounter: Payer: Self-pay | Admitting: Pediatrics

## 2016-11-23 ENCOUNTER — Ambulatory Visit (INDEPENDENT_AMBULATORY_CARE_PROVIDER_SITE_OTHER): Payer: Medicare Other | Admitting: Pediatrics

## 2016-11-23 VITALS — BP 127/72 | HR 73 | Temp 98.4°F | Ht 71.0 in | Wt 189.2 lb

## 2016-11-23 DIAGNOSIS — R609 Edema, unspecified: Secondary | ICD-10-CM | POA: Diagnosis not present

## 2016-11-23 DIAGNOSIS — Z7901 Long term (current) use of anticoagulants: Secondary | ICD-10-CM

## 2016-11-23 MED ORDER — JANTOVEN 5 MG PO TABS: ORAL_TABLET | ORAL | 0 refills | Status: AC

## 2016-11-23 NOTE — Progress Notes (Signed)
  Subjective:   Patient ID: [name], male    DOB: 06/05/1940, 76 y.o.   MRN: [id number] CC: Edema (right foot)  HPI: [name] is a 76 y.o. male presenting for Edema (right foot)  Had swelling in his R foot several days ago  Has since resolved No pain now, no swelling No known injury  Feeling ok otherwise On hospice for bladder cancer Had INR drawn earlier this week Says it was 1.7 Goal now 1.6-1.8 because if not on any anticoagulation he gets blood clots in catheter/urethra Also with nephrostomy tubes now  Relevant past medical, surgical, family and social history reviewed. Allergies and medications reviewed and updated. History  Smoking Status  . Former Smoker  . Packs/day: 0.00  . Years: 0.00  . Types: Cigarettes  . Quit date: 09/21/1973  Smokeless Tobacco  . Never Used   ROS: Per HPI   Objective:    BP 127/72   Pulse 73   Temp 98.4 F (36.9 C) (Oral)   Ht 5\' 11"  (1.803 m)   Wt 189 lb 3.2 oz (85.8 kg)   BMI 26.39 kg/m   Wt Readings from Last 3 Encounters:  11/23/16 189 lb 3.2 oz (85.8 kg)  09/10/16 194 lb (88 kg)  09/07/16 194 lb (88 kg)    Gen: NAD, alert, cooperative with exam, NCAT EYES: EOMI, no conjunctival injection, or no icterus CV: NRRR, normal S1/S2, no murmur Resp: CTABL, no wheezes, normal WOB Ext: No edema, warm Neuro: Alert and oriented, strength equal b/l UE and LE, coordination grossly normal MSK: normal appearance to L and R feet, no swelling or redness, no tenderness with palpation  Assessment & Plan:  Vern was seen today for edema. Has since resolved  Under hospice care for bladder cancer Feeling well overall Continuing warfarin therapy, goal inr 1.6-1.8   Diagnoses and all orders for this visit:  Edema, unspecified type  Other orders -     JANTOVEN 5 MG tablet; Take as directed   Follow up plan: Return if symptoms worsen or fail to improve. Brett Found, MD [place]

## 2016-11-28 ENCOUNTER — Ambulatory Visit: Payer: Self-pay | Admitting: Pharmacist

## 2016-11-28 ENCOUNTER — Ambulatory Visit (INDEPENDENT_AMBULATORY_CARE_PROVIDER_SITE_OTHER): Payer: Medicare Other | Admitting: Pharmacist

## 2016-11-28 DIAGNOSIS — I4891 Unspecified atrial fibrillation: Secondary | ICD-10-CM

## 2016-11-28 LAB — POCT INR: INR: 1.5

## 2016-12-05 ENCOUNTER — Ambulatory Visit (INDEPENDENT_AMBULATORY_CARE_PROVIDER_SITE_OTHER): Payer: Medicare Other | Admitting: Pharmacist

## 2016-12-05 ENCOUNTER — Telehealth: Payer: Self-pay | Admitting: Pharmacist

## 2016-12-05 DIAGNOSIS — I4891 Unspecified atrial fibrillation: Secondary | ICD-10-CM

## 2016-12-05 LAB — POCT INR: INR: 1.4

## 2016-12-05 NOTE — Telephone Encounter (Signed)
INR 1.4 today per [name] Current warfarin dose is - warfarin 5mg  tablet - take 1 tablet Mondays and Friday.  1/2 tablet all other days.  Patient instructed to take extra 1/2 tablet for 1 dose then resume dose above.   Recheck INR in 1 week.

## 2016-12-05 NOTE — Consult Note (Signed)
Subjective:     Indication: atrial fibrillation Bleeding signs/symptoms: None Thromboembolic signs/symptoms: None  Missed Coumadin doses: None Medication changes: no Dietary changes: no Bacterial/viral infection: no Other concerns: no    Objective:    INR Today: 1.4 Current dose: 5mg  mondays and Friday.  2.5mg  all other    Assessment:    Subtherapeutic INR for goal of 1.5 to 2.0   Plan:    1. New dose: take extra 1/2 tablet tomorrow.  Then resume usual dose of 5mg  Mondays and Fridays.  2.5mg  all other days   2. Next INR: 1 week

## 2016-12-12 ENCOUNTER — Ambulatory Visit (INDEPENDENT_AMBULATORY_CARE_PROVIDER_SITE_OTHER): Payer: Medicare Other | Admitting: Pharmacist

## 2016-12-12 DIAGNOSIS — I4891 Unspecified atrial fibrillation: Secondary | ICD-10-CM

## 2016-12-12 LAB — POCT INR: INR: 1.7

## 2016-12-14 IMAGING — DX DG CHEST 2V
2 series · 2 of 2 positions shown · non-contrast
Comparison: Chest radiograph performed 11/27/2014

CLINICAL DATA: Acute onset of generalized chest pain. Initial
encounter.

EXAM:
CHEST  2 VIEW

[w chest pa]
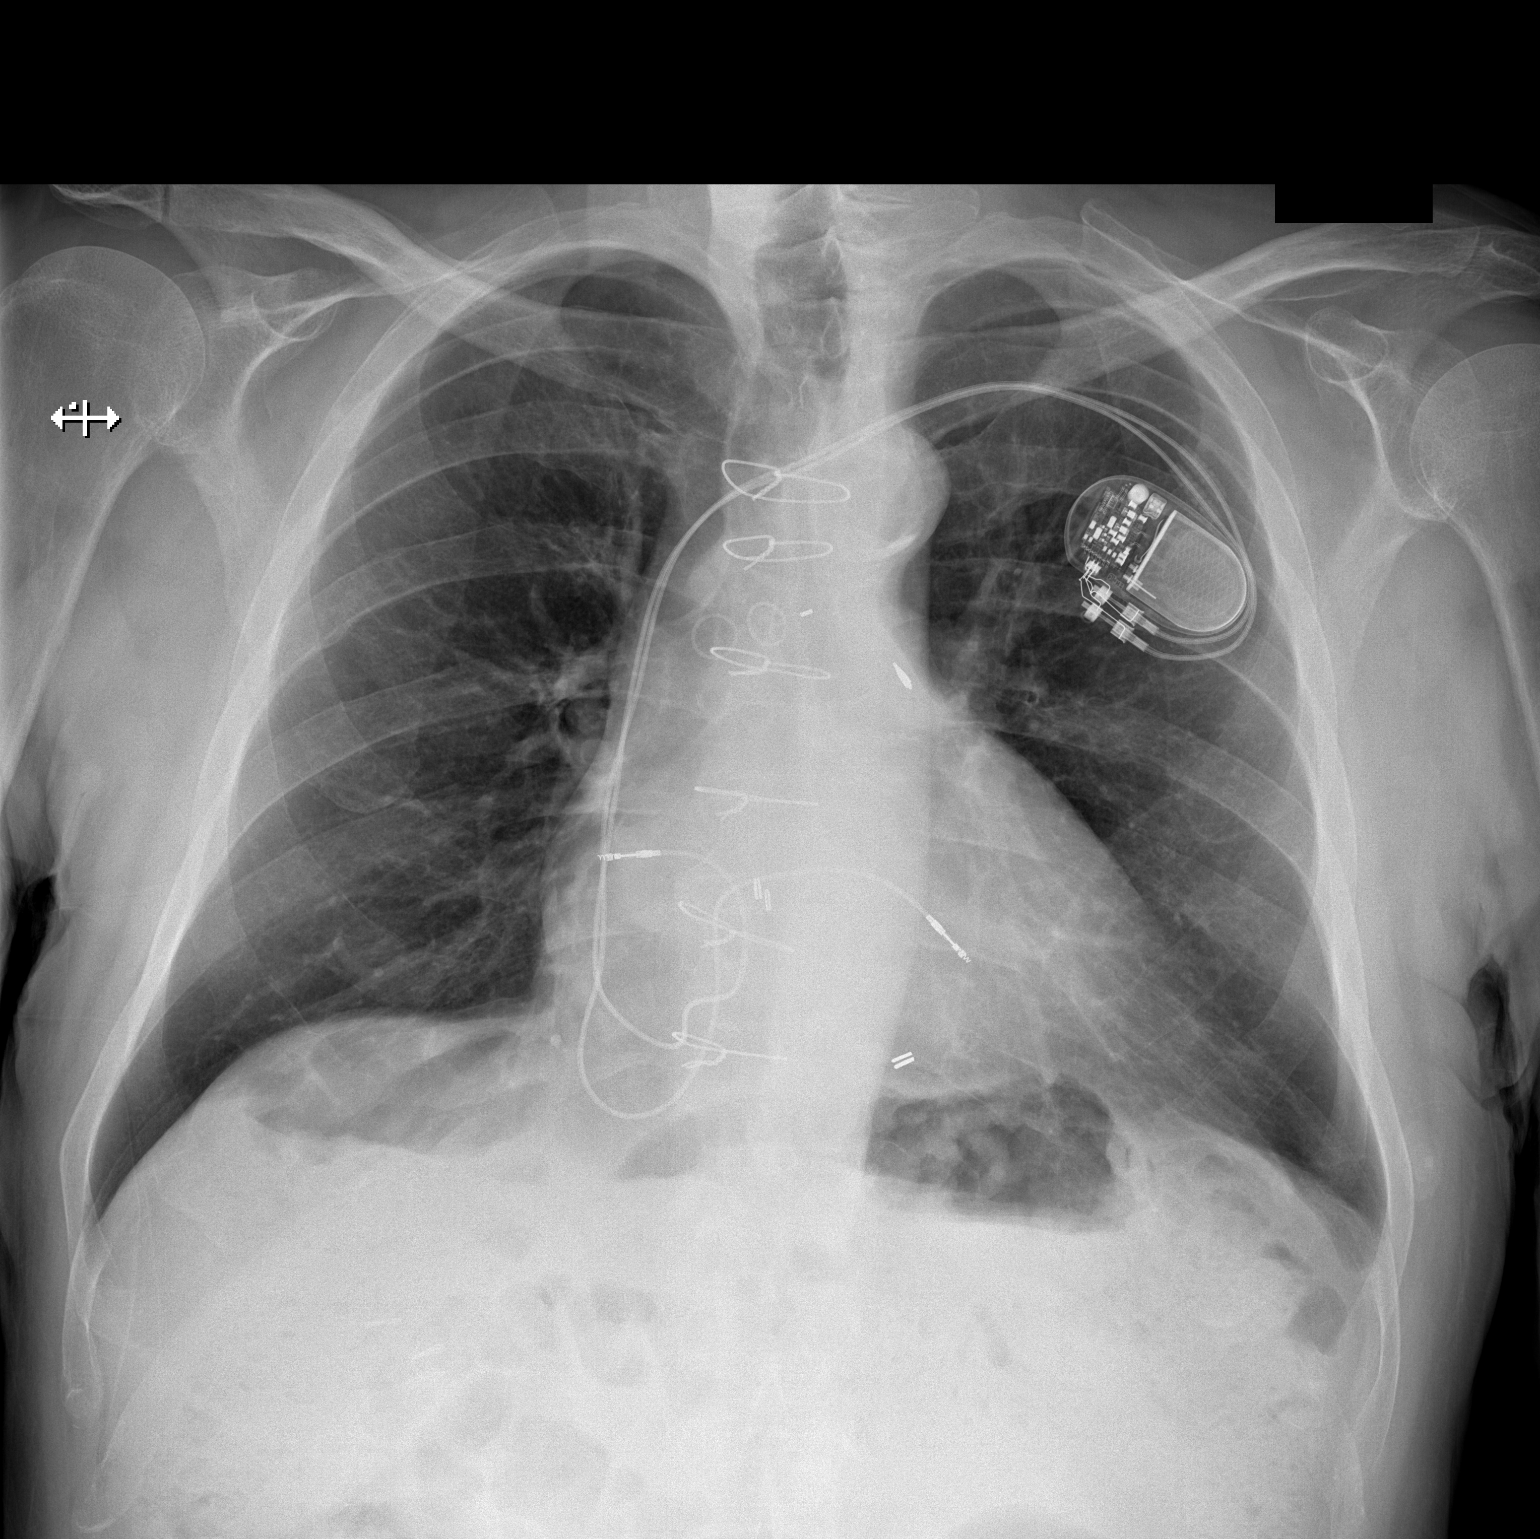

[w chest lat]
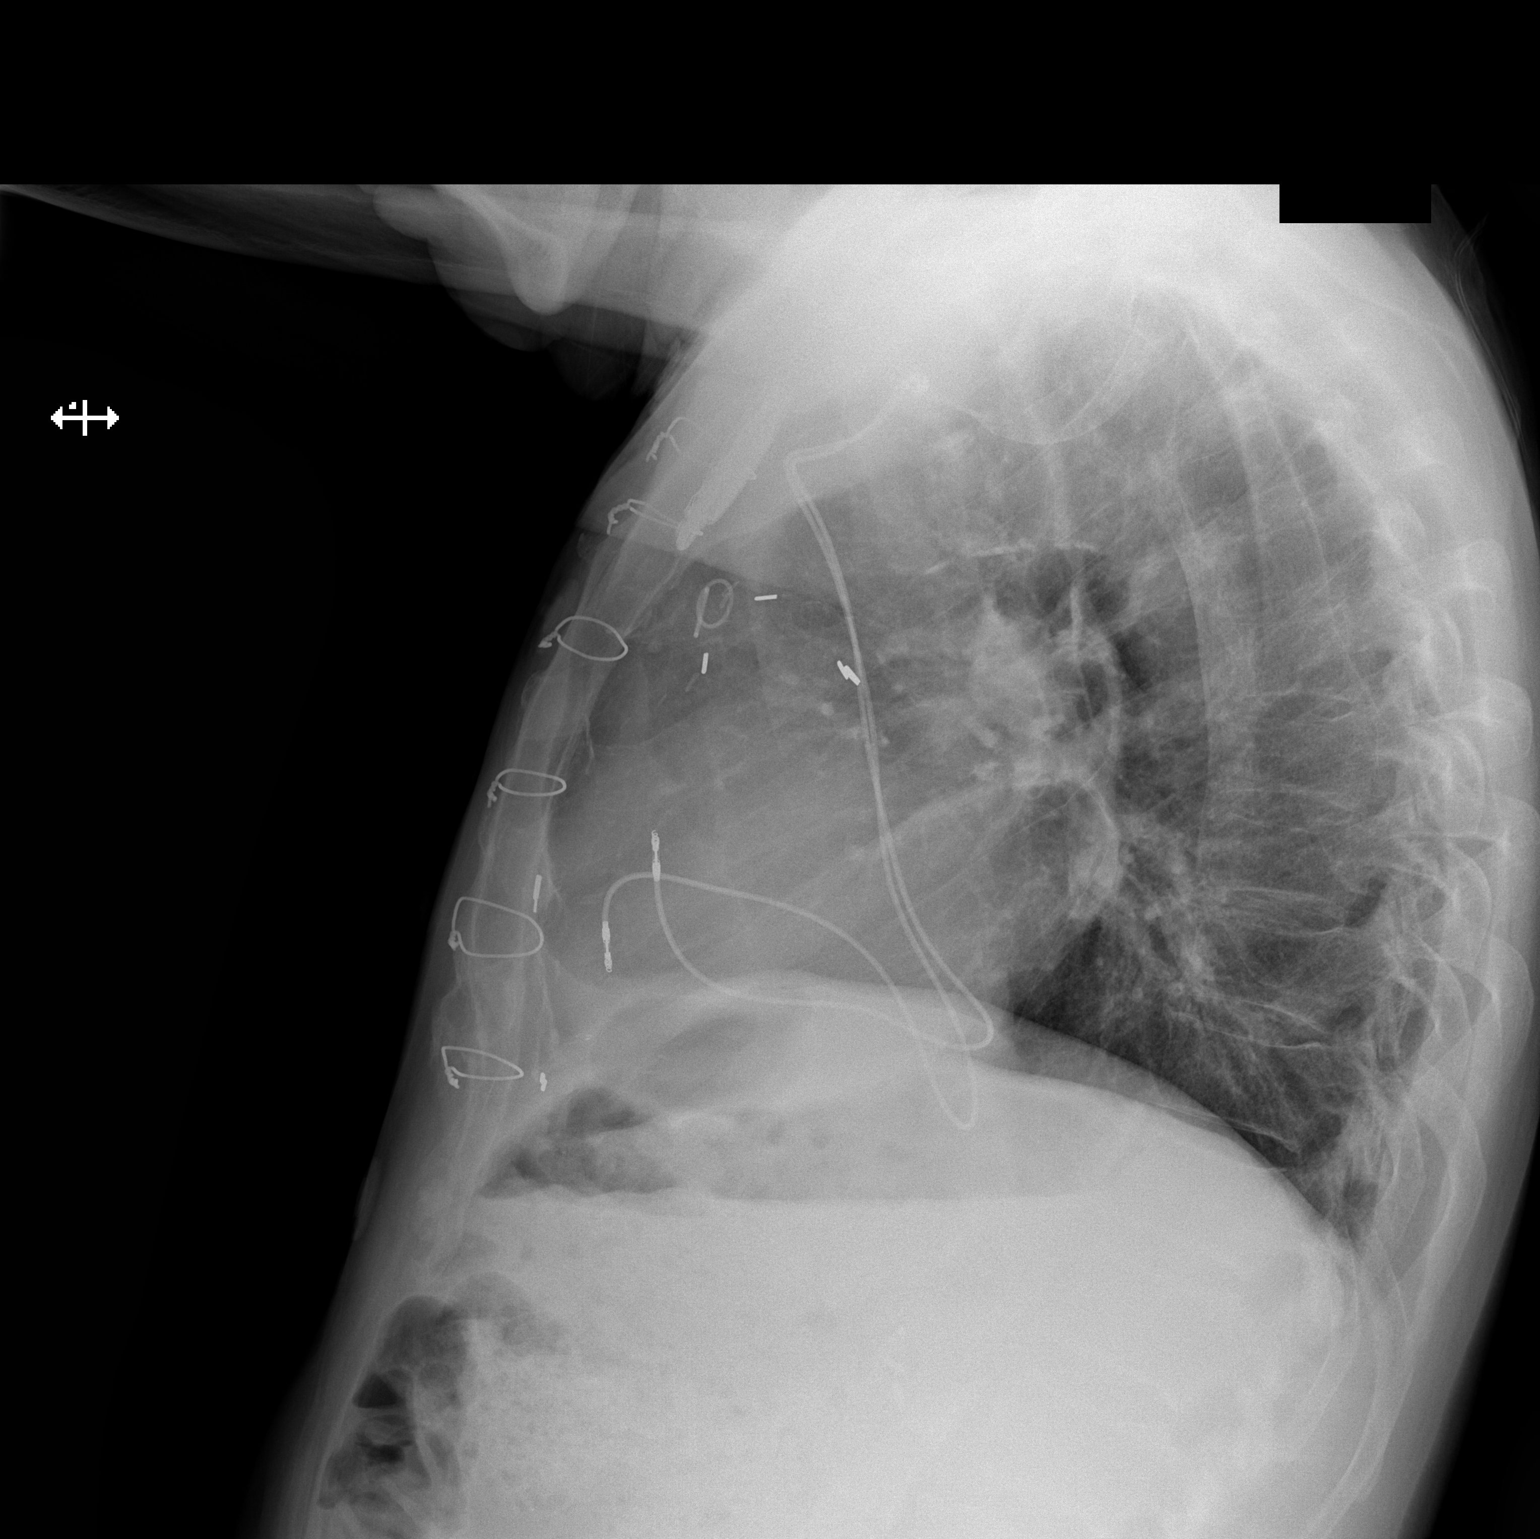

[2 of 2 positions shown; findings below may reference images not displayed]

FINDINGS: The lungs are well-aerated. Mild peribronchial thickening is noted.
There is no evidence of focal opacification, pleural effusion or
pneumothorax.

The heart is borderline normal in size. The patient is status post
median sternotomy, with evidence of prior CABG. A pacemaker is noted
at the left chest wall, with leads ending at the right atrium and
right ventricle. No acute osseous abnormalities are seen. Clips are
noted within the right upper quadrant, reflecting prior
cholecystectomy.
IMPRESSION: 1. No acute cardiopulmonary process seen.
2. Mild peribronchial thickening noted.

## 2016-12-19 ENCOUNTER — Telehealth: Payer: Self-pay | Admitting: *Deleted

## 2016-12-19 NOTE — Telephone Encounter (Signed)
INR 1.4 today. Takes 5mg  daily, except Mon and Fri takes 2.5 mg. Have nurse call hospice

## 2016-12-19 NOTE — Telephone Encounter (Signed)
Have pt take 5mg  tonight (Mon, would usually take 2.5mg ), then back to regular dosing, 5mg  daily, Mon, Fri 2.5mg . Recheck INR next week.

## 2016-12-26 ENCOUNTER — Telehealth: Payer: Self-pay | Admitting: *Deleted

## 2016-12-26 NOTE — Telephone Encounter (Signed)
Today's INR--1.6

## 2016-12-27 ENCOUNTER — Ambulatory Visit: Payer: Self-pay | Admitting: *Deleted

## 2016-12-27 DIAGNOSIS — I4891 Unspecified atrial fibrillation: Secondary | ICD-10-CM

## 2016-12-27 LAB — POCT INR: INR: 1.6 — AB (ref <=1.1)

## 2016-12-27 NOTE — Telephone Encounter (Signed)
INR result

## 2016-12-27 NOTE — Progress Notes (Signed)
Left message with Hospice that INR is within range and should be rechecked in 2 weeks per Dr Evette Doffing.

## 2016-12-29 ENCOUNTER — Ambulatory Visit (INDEPENDENT_AMBULATORY_CARE_PROVIDER_SITE_OTHER): Payer: Medicare Other | Admitting: Pediatrics

## 2016-12-29 DIAGNOSIS — H3530 Unspecified macular degeneration: Secondary | ICD-10-CM | POA: Diagnosis not present

## 2016-12-29 DIAGNOSIS — I519 Heart disease, unspecified: Secondary | ICD-10-CM

## 2016-12-29 DIAGNOSIS — M797 Fibromyalgia: Secondary | ICD-10-CM

## 2016-12-29 DIAGNOSIS — C679 Malignant neoplasm of bladder, unspecified: Secondary | ICD-10-CM | POA: Diagnosis not present

## 2016-12-29 DIAGNOSIS — I4891 Unspecified atrial fibrillation: Secondary | ICD-10-CM | POA: Diagnosis not present

## 2016-12-29 DIAGNOSIS — R5383 Other fatigue: Secondary | ICD-10-CM | POA: Diagnosis not present

## 2016-12-29 DIAGNOSIS — R531 Weakness: Secondary | ICD-10-CM

## 2017-01-09 ENCOUNTER — Telehealth: Payer: Self-pay | Admitting: *Deleted

## 2017-01-09 ENCOUNTER — Ambulatory Visit (INDEPENDENT_AMBULATORY_CARE_PROVIDER_SITE_OTHER): Payer: Medicare Other | Admitting: *Deleted

## 2017-01-09 DIAGNOSIS — I4891 Unspecified atrial fibrillation: Secondary | ICD-10-CM

## 2017-01-09 LAB — POCT INR: INR: 1.5 — AB (ref <=1.1)

## 2017-01-09 NOTE — Telephone Encounter (Signed)
Entered into an anticoagulation note

## 2017-01-09 NOTE — Telephone Encounter (Signed)
Today's INR--1.5

## 2017-01-09 NOTE — Progress Notes (Signed)
INR today 1.5 (goal 1.5-2.0) Continue current dose and rck in 1 week. Left message for Pearson Forster at [facility] with this information.

## 2017-01-16 ENCOUNTER — Ambulatory Visit: Payer: Self-pay | Admitting: *Deleted

## 2017-01-16 DIAGNOSIS — I4891 Unspecified atrial fibrillation: Secondary | ICD-10-CM

## 2017-01-16 LAB — POCT INR: INR: 1.4 — AB (ref 0.9–1.1)

## 2017-01-16 NOTE — Progress Notes (Signed)
5mg  Tuesday this week as well, back to 5mg  M/F, 2.5 all other days recheck in 1 week

## 2017-01-16 NOTE — Progress Notes (Signed)
Hospice patient with INR of 1.4 today. His goal is 1.5 to 2.0.  Currently taking coumadin 5mg  on Mon and Friday and 2.5 mg all other days.   This information was left on voicemail by Pearson Forster with Hospice.   What do you recommend?

## 2017-01-16 NOTE — Progress Notes (Signed)
Can you have him take 5mg  Tuesday night this week as well. Then back to 5mg  MF, 2.5mg  other days. Recheck in 1 week.

## 2017-01-16 NOTE — Progress Notes (Signed)
Rcvd on VM Today's INR 1.4

## 2017-01-18 NOTE — Progress Notes (Signed)
Spoke with Pearson Forster, hospice nurse Tuesday evening and directions given.

## 2017-01-24 ENCOUNTER — Ambulatory Visit: Payer: Self-pay | Admitting: *Deleted

## 2017-01-24 DIAGNOSIS — I4891 Unspecified atrial fibrillation: Secondary | ICD-10-CM

## 2017-01-24 NOTE — Progress Notes (Signed)
D/w pt, taking 5mg  MF, 2.5mg  other days Increase to 5mg  MWF, cont 2.5mg  other days Recheck 1 week with hospice

## 2017-01-24 NOTE — Progress Notes (Signed)
TC with INR report from last yesterday afternoon INR 1.5 Pt takes 5 mg Coumadin everyday except Monday & Fridays he takes 2.5 mg Please call patient with any medication changes

## 2017-01-24 NOTE — Progress Notes (Addendum)
Received call from Hospice nurse with INR report.  01/23/17 INR 1.5  Pt states he takes 5 mg coumadin  Monday & Fridays he takes 2.5 mg all other days  Patient's goal is 1.5-2.0   Please advise on recommendations

## 2017-01-25 NOTE — Progress Notes (Signed)
[name] at [facility] notified of Coumadin dosage instructions per Dr. Evette Doffing, and to recheck protime 01/30/17.

## 2017-01-30 ENCOUNTER — Telehealth: Payer: Self-pay | Admitting: *Deleted

## 2017-01-30 NOTE — Telephone Encounter (Signed)
INR 2.2 per hospice Nurse

## 2017-01-30 NOTE — Telephone Encounter (Signed)
Instructions given to hospice nurse

## 2017-01-30 NOTE — Telephone Encounter (Signed)
May resume previous dosing of Coumadin 2.5mg  daily; with 5mg  on Mondays and Fridays.  Recheck INR in 1 week.

## 2017-02-03 ENCOUNTER — Ambulatory Visit

## 2017-02-06 ENCOUNTER — Ambulatory Visit (INDEPENDENT_AMBULATORY_CARE_PROVIDER_SITE_OTHER): Admitting: *Deleted

## 2017-02-06 ENCOUNTER — Telehealth: Payer: Self-pay | Admitting: Pediatrics

## 2017-02-06 DIAGNOSIS — I4891 Unspecified atrial fibrillation: Secondary | ICD-10-CM

## 2017-02-06 NOTE — Telephone Encounter (Signed)
Please advise 

## 2017-02-06 NOTE — Patient Instructions (Addendum)
Anticoagulation Warfarin Dose Instructions as of 02/06/2017      [name] Tue Wed Thu Fri Sat   New Dose 2.5 mg 5 mg 2.5 mg 2.5 mg 2.5 mg 5 mg 2.5 mg    Description   Take 5mg  on Mon and Fri and 2.5mg  other days.  INR was 1.5 02/06/17 (goal 1.5-2.0).  Rck in 2 weeks 02/20/17     Patient and hospice notified of dosage recommendations and to recheck in 2 weeks.

## 2017-02-06 NOTE — Progress Notes (Signed)
Anticoagulation Warfarin Dose Instructions as of 02/06/2017      [name] Tue Wed Thu Fri Sat   New Dose 2.5 mg 5 mg 2.5 mg 2.5 mg 2.5 mg 5 mg 2.5 mg    Description   Take 5mg  on Mon and Fri and 2.5mg  other days.  INR was 1.5 02/06/17 (goal 1.5-2.0).  Rck in 2 weeks 02/20/17      Per Dr. Evette Doffing patient recommended to keep Coumadin dosage the same- 5 mg on Monday and Friday, 2.5 mg all other days.  Recheck in 2 weeks.  Patient and hospice nurse notified.

## 2017-02-06 NOTE — Progress Notes (Signed)
TC from Pearson Forster with Hospice INR 1.5 today  Can call patient back with any changes

## 2017-02-06 NOTE — Addendum Note (Signed)
Addended by: Denyce Robert on: 02/06/2017 05:03 PM   Modules accepted: Level of Service

## 2017-02-07 ENCOUNTER — Ambulatory Visit

## 2017-02-07 NOTE — Telephone Encounter (Signed)
Informed patient that he can get the flu shot if he would like or not.  Informed patient that flu shot is recommended but not required

## 2017-02-08 ENCOUNTER — Ambulatory Visit

## 2017-02-08 ENCOUNTER — Ambulatory Visit: Admitting: Pediatrics

## 2017-02-10 ENCOUNTER — Other Ambulatory Visit: Payer: Self-pay | Admitting: *Deleted

## 2017-02-10 DIAGNOSIS — F419 Anxiety disorder, unspecified: Secondary | ICD-10-CM

## 2017-02-11 MED ORDER — DOXEPIN HCL 25 MG PO CAPS: ORAL_CAPSULE | ORAL | 3 refills | Status: AC

## 2017-02-16 ENCOUNTER — Telehealth: Payer: Self-pay | Admitting: Pediatrics

## 2017-02-17 ENCOUNTER — Ambulatory Visit (INDEPENDENT_AMBULATORY_CARE_PROVIDER_SITE_OTHER): Admitting: *Deleted

## 2017-02-17 DIAGNOSIS — Z23 Encounter for immunization: Secondary | ICD-10-CM

## 2017-02-17 NOTE — Telephone Encounter (Signed)
Called pt, coming by today for flu shot

## 2017-02-17 NOTE — Telephone Encounter (Signed)
1-2+ pitting edema in ankles b/l Take lasix 40mg  for two days

## 2017-02-17 NOTE — Telephone Encounter (Signed)
Good urine output from both nephrostomy tubes

## 2017-02-21 ENCOUNTER — Telehealth: Payer: Self-pay | Admitting: *Deleted

## 2017-02-21 NOTE — Telephone Encounter (Signed)
FYI

## 2017-02-21 NOTE — Telephone Encounter (Signed)
That's fine, goal 1.5 to 2.0

## 2017-02-21 NOTE — Telephone Encounter (Signed)
Incoming call from [facility] INR 1.7

## 2017-02-21 NOTE — Telephone Encounter (Signed)
Notified Otila Kluver that his INR was within range and to continue with current dose of coumadin. Rck in 1 week.   Chong Sicilian, RN

## 2017-02-24 ENCOUNTER — Telehealth: Payer: Self-pay | Admitting: Pediatrics

## 2017-02-24 NOTE — Telephone Encounter (Signed)
Patient aware of results.

## 2017-02-28 ENCOUNTER — Telehealth: Payer: Self-pay | Admitting: Pediatrics

## 2017-02-28 NOTE — Telephone Encounter (Signed)
Dentist told pt he would need to be off Warfarin x 3 days due to having tooth extraction on Tuesday Please review and advise

## 2017-03-01 NOTE — Telephone Encounter (Signed)
Don't take warfarin starting Friday 11/2. May restart the night of the procedure.

## 2017-03-01 NOTE — Telephone Encounter (Signed)
Pt notified of recommendation Verbalizes understanding 

## 2017-03-07 ENCOUNTER — Telehealth: Payer: Self-pay | Admitting: Pediatrics

## 2017-03-07 NOTE — Telephone Encounter (Signed)
Pt notified to restart blood thinner after procedure Verbalizes understanding

## 2017-03-09 ENCOUNTER — Ambulatory Visit: Payer: Medicare Other | Admitting: Family

## 2017-03-09 ENCOUNTER — Encounter: Payer: Self-pay | Admitting: Family

## 2017-03-09 VITALS — BP 128/69 | HR 68 | Temp 97.0°F | Ht 71.0 in | Wt 183.4 lb

## 2017-03-09 DIAGNOSIS — B372 Candidiasis of skin and nail: Secondary | ICD-10-CM | POA: Diagnosis not present

## 2017-03-09 MED ORDER — NYSTATIN 100000 UNIT/GM EX OINT: TOPICAL_OINTMENT | Freq: Two times a day (BID) | CUTANEOUS | 1 refills | Status: AC

## 2017-03-09 MED ORDER — NYSTATIN 100000 UNIT/GM EX POWD: Freq: Four times a day (QID) | CUTANEOUS | 0 refills | Status: AC

## 2017-03-09 MED ORDER — FLUCONAZOLE 150 MG PO TABS: 150.0000 mg | ORAL_TABLET | ORAL | 0 refills | Status: AC | PRN

## 2017-03-09 NOTE — Patient Instructions (Signed)
Genital Yeast Infection, Male In men, a genital yeast infection is a condition that causes soreness, swelling, and redness (inflammation) of the head of the penis (glans penis). This type of infection is also called balanitis. A genital yeast infection can be spread through sexual contact, but it can also develop without sexual contact. If the infection is not treated properly, it is likely to come back. What are the causes? This condition is caused by a change in the normal balance of the yeast and bacteria that live on the skin. This change causes an overgrowth of yeast, which causes the inflammation. Many types of yeast can cause this infection, but candida is the most common. What increases the risk? This condition is more likely to develop in a man if:  He takes antibiotic medicines.  He has diabetes.  He is exposed to the infection by a sexual partner.  He is not circumcised.  He has a weak defense (immune) system.  He has been taking steroid medicines for a long time.  He has poor hygiene.  What are the signs or symptoms? Symptoms of this condition include:  Itching.  Dry, red, or cracked skin on the penis.  Swelling.  Pain while urinating or difficulty urinating.  Thick, bad-smelling discharge on the penis.  How is this diagnosed? This condition is diagnosed with a medical history and physical exam. Your health care provider may examine a sample of any discharge from the penis and send the sample for testing. You may also have tests, including:  A urine test to rule out a urinary tract infection (UTI).  A blood test to rule out diabetes.  How is this treated? This condition is treated with antifungal cream or pills along with self-care at home. Antifungal medicines may be prescribed to you or they may be available over-the-counter. For men who are not circumcised, circumcision may be recommended to control infections that return and are difficult to treat. Follow  these instructions at home:  Take or apply over-the-counter and prescription medicines only as told by your health care provider.  Do not have sex until your health care provider has approved. Tell your sexual partner that you have a yeast infection. That person should go to his or her health care provider if he or she develops symptoms.  Eat more yogurt. This may help to keep your yeast infection from returning.  Wash your penis with soap and water every day. If you are not circumcised, pull back the foreskin to wash. Make sure to dry your penis completely after washing.  Wear breathable, cotton underwear.  Keep your underwear clean and dry.  If you have diabetes, keep your blood sugar levels under strict control. Contact a health care provider if:  You have a fever.  Your symptoms go away and then return.  Your symptoms do not get better with treatment.  Your symptoms get worse.  You have new symptoms. Get help right away if:  Your swelling and inflammation become so severe that you cannot urinate. This information is not intended to replace advice given to you by your health care provider. Make sure you discuss any questions you have with your health care provider. Document Released: 05/26/2004 Document Revised: 09/24/2015 Document Reviewed: 10/20/2014 Elsevier Interactive Patient Education  2018 [employer]  

## 2017-03-09 NOTE — Progress Notes (Signed)
   Subjective:    Patient ID: [name], male    DOB: 11/01/1940, 76 y.o.   MRN: [id number]  Pt presents to the office today with groin rash that started several weeks ago. Pt has bladder cancer and foley cath and bilateral nephrostomy tubes present.  Rash  This is a new problem. The current episode started yesterday. The problem is unchanged. The affected locations include the groin and genitalia. The rash is characterized by redness. He was exposed to nothing. Pertinent negatives include no congestion, cough, diarrhea, eye pain, fever, joint pain, shortness of breath or sore throat. Treatments tried: nystain. The treatment provided mild relief.      Review of Systems  Constitutional: Negative for fever.  HENT: Negative for congestion and sore throat.   Eyes: Negative for pain.  Respiratory: Negative for cough and shortness of breath.   Gastrointestinal: Negative for diarrhea.  Musculoskeletal: Negative for joint pain.  Skin: Positive for rash.  All other systems reviewed and are negative.      Objective:   Physical Exam  Constitutional: He is oriented to person, place, and time. He appears well-developed and well-nourished. No distress.  HENT:  Head: Normocephalic.  Neck: Normal range of motion. Neck supple. No thyromegaly present.  Cardiovascular: Normal rate, regular rhythm, normal heart sounds and intact distal pulses.  No murmur heard. Pulmonary/Chest: Effort normal and breath sounds normal. No respiratory distress. He has no wheezes.  Abdominal: Soft. Bowel sounds are normal. He exhibits no distension. There is no tenderness.  Genitourinary:     Genitourinary Comments: bilateral nephrostomy tubes and foley cath present  Musculoskeletal: Normal range of motion. He exhibits no edema or tenderness.  Neurological: He is alert and oriented to person, place, and time.  Skin: Skin is warm and dry. Rash noted. There is erythema.  Erythemas rash in right groin and under  penis with white chuncky discharge present and odor.   Psychiatric: He has a normal mood and affect. His behavior is normal. Judgment and thought content normal.  Vitals reviewed.    BP 128/69   Pulse 68   Temp (!) 97 F (36.1 C) (Oral)   Ht 5\' 11"  (1.803 m)   Wt 183 lb 6.4 oz (83.2 kg)   BMI 25.58 kg/m       Assessment & Plan:  1. Skin yeast infection Keep clean and dry Avoid scratching Cotton underwear RTO prn  - nystatin (MYCOSTATIN/NYSTOP) powder; Apply 4 (four) times daily topically.  Dispense: 15 g; Refill: 0 - nystatin ointment (MYCOSTATIN); Apply 2 (two) times daily topically.  Dispense: 60 g; Refill: 1 - fluconazole (DIFLUCAN) 150 MG tablet; Take 1 tablet (150 mg total) every three (3) days as needed by mouth.  Dispense: 3 tablet; Refill: 0    Evelina Dun, FNP

## 2017-03-14 ENCOUNTER — Emergency Department (HOSPITAL_COMMUNITY)
Admission: EM | Admit: 2017-03-14 | Discharge: 2017-03-14 | Disposition: A | Discharge status: Home / Self Care | Payer: Medicare Other | Attending: Emergency Medicine | Admitting: Emergency Medicine

## 2017-03-14 ENCOUNTER — Emergency Department (HOSPITAL_COMMUNITY): Payer: Medicare Other

## 2017-03-14 ENCOUNTER — Encounter (HOSPITAL_COMMUNITY): Payer: Self-pay | Admitting: *Deleted

## 2017-03-14 ENCOUNTER — Other Ambulatory Visit: Payer: Self-pay

## 2017-03-14 DIAGNOSIS — W1830XA Fall on same level, unspecified, initial encounter: Secondary | ICD-10-CM | POA: Diagnosis not present

## 2017-03-14 DIAGNOSIS — I5022 Chronic systolic (congestive) heart failure: Secondary | ICD-10-CM | POA: Insufficient documentation

## 2017-03-14 DIAGNOSIS — S098XXA Other specified injuries of head, initial encounter: Secondary | ICD-10-CM | POA: Insufficient documentation

## 2017-03-14 DIAGNOSIS — Z87891 Personal history of nicotine dependence: Secondary | ICD-10-CM | POA: Insufficient documentation

## 2017-03-14 DIAGNOSIS — Y9389 Activity, other specified: Secondary | ICD-10-CM | POA: Diagnosis not present

## 2017-03-14 DIAGNOSIS — S5002XA Contusion of left elbow, initial encounter: Secondary | ICD-10-CM | POA: Insufficient documentation

## 2017-03-14 DIAGNOSIS — Z8551 Personal history of malignant neoplasm of bladder: Secondary | ICD-10-CM | POA: Insufficient documentation

## 2017-03-14 DIAGNOSIS — Z951 Presence of aortocoronary bypass graft: Secondary | ICD-10-CM | POA: Insufficient documentation

## 2017-03-14 DIAGNOSIS — E860 Dehydration: Secondary | ICD-10-CM | POA: Insufficient documentation

## 2017-03-14 DIAGNOSIS — S300XXA Contusion of lower back and pelvis, initial encounter: Secondary | ICD-10-CM

## 2017-03-14 DIAGNOSIS — I2510 Atherosclerotic heart disease of native coronary artery without angina pectoris: Secondary | ICD-10-CM | POA: Insufficient documentation

## 2017-03-14 DIAGNOSIS — S7002XA Contusion of left hip, initial encounter: Secondary | ICD-10-CM | POA: Diagnosis not present

## 2017-03-14 DIAGNOSIS — Y999 Unspecified external cause status: Secondary | ICD-10-CM | POA: Insufficient documentation

## 2017-03-14 DIAGNOSIS — Z950 Presence of cardiac pacemaker: Secondary | ICD-10-CM | POA: Insufficient documentation

## 2017-03-14 DIAGNOSIS — I110 Hypertensive heart disease with heart failure: Secondary | ICD-10-CM | POA: Diagnosis not present

## 2017-03-14 DIAGNOSIS — Z85828 Personal history of other malignant neoplasm of skin: Secondary | ICD-10-CM | POA: Diagnosis not present

## 2017-03-14 DIAGNOSIS — R8290 Unspecified abnormal findings in urine: Secondary | ICD-10-CM | POA: Insufficient documentation

## 2017-03-14 DIAGNOSIS — S0990XA Unspecified injury of head, initial encounter: Secondary | ICD-10-CM

## 2017-03-14 DIAGNOSIS — Y92012 Bathroom of single-family (private) house as the place of occurrence of the external cause: Secondary | ICD-10-CM | POA: Diagnosis not present

## 2017-03-14 LAB — CBC WITH DIFFERENTIAL/PLATELET
BASOS PCT: 0 %
Basophils Absolute: 0 10*3/uL (ref 0.0–0.1)
EOS ABS: 0.1 10*3/uL (ref 0.0–0.7)
EOS PCT: 1 %
HCT: 30.5 % — ABNORMAL LOW (ref 39.0–52.0)
Hemoglobin: 9.6 g/dL — ABNORMAL LOW (ref 13.0–17.0)
LYMPHS ABS: 1 10*3/uL (ref 0.7–4.0)
Lymphocytes Relative: 11 %
MCH: 26 pg (ref 26.0–34.0)
MCHC: 31.5 g/dL (ref 30.0–36.0)
MCV: 82.7 fL (ref 78.0–100.0)
MONO ABS: 0.7 10*3/uL (ref 0.1–1.0)
MONOS PCT: 8 %
Neutro Abs: 7.4 10*3/uL (ref 1.7–7.7)
Neutrophils Relative %: 80 %
PLATELETS: 176 10*3/uL (ref 150–400)
RBC: 3.69 MIL/uL — ABNORMAL LOW (ref 4.22–5.81)
RDW: 16.4 % — AB (ref 11.5–15.5)
WBC: 9.2 10*3/uL (ref 4.0–10.5)

## 2017-03-14 LAB — URINALYSIS, ROUTINE W REFLEX MICROSCOPIC
GLUCOSE, UA: NEGATIVE mg/dL
Ketones, ur: 15 mg/dL — AB
Nitrite: POSITIVE — AB
SPECIFIC GRAVITY, URINE: 1.025 (ref 1.005–1.030)
pH: 6.5 (ref 5.0–8.0)

## 2017-03-14 LAB — URINALYSIS, MICROSCOPIC (REFLEX): Squamous Epithelial / LPF: NONE SEEN

## 2017-03-14 LAB — COMPREHENSIVE METABOLIC PANEL
ALK PHOS: 70 U/L (ref 38–126)
ALT: 17 U/L (ref 17–63)
ANION GAP: 9 (ref 5–15)
AST: 26 U/L (ref 15–41)
Albumin: 3.5 g/dL (ref 3.5–5.0)
BUN: 36 mg/dL — ABNORMAL HIGH (ref 6–20)
CALCIUM: 8.9 mg/dL (ref 8.9–10.3)
CO2: 26 mmol/L (ref 22–32)
CREATININE: 1.57 mg/dL — AB (ref 0.61–1.24)
Chloride: 93 mmol/L — ABNORMAL LOW (ref 101–111)
GFR calc non Af Amer: 41 mL/min — ABNORMAL LOW (ref >60)
GFR, EST AFRICAN AMERICAN: 48 mL/min — AB (ref >60)
Glucose, Bld: 105 mg/dL — ABNORMAL HIGH (ref 65–99)
POTASSIUM: 4.4 mmol/L (ref 3.5–5.1)
SODIUM: 128 mmol/L — AB (ref 135–145)
Total Bilirubin: 1.9 mg/dL — ABNORMAL HIGH (ref 0.3–1.2)
Total Protein: 6.7 g/dL (ref 6.5–8.1)

## 2017-03-14 LAB — PROTIME-INR
INR: 1.32
Prothrombin Time: 16.3 seconds — ABNORMAL HIGH (ref 11.4–15.2)

## 2017-03-14 LAB — I-STAT CG4 LACTIC ACID, ED: Lactic Acid, Venous: 1.12 mmol/L (ref 0.5–1.9)

## 2017-03-14 LAB — I-STAT TROPONIN, ED: TROPONIN I, POC: 0 ng/mL (ref 0.00–0.08)

## 2017-03-14 LAB — POC OCCULT BLOOD, ED: Fecal Occult Bld: NEGATIVE

## 2017-03-14 MED ORDER — SODIUM CHLORIDE 0.9 % IV BOLUS (SEPSIS)
500.0000 mL | Freq: Once | INTRAVENOUS | Status: AC
  Administered 2017-03-14: 500 mL via INTRAVENOUS

## 2017-03-14 NOTE — ED Provider Notes (Addendum)
[facility] EMERGENCY DEPARTMENT Provider Note   CSN: [id number] Arrival date & time: 03/14/17  0547     History   Chief Complaint Chief Complaint  Patient presents with  . Fall    HPI [name] is a 76 y.o. male.  Patient presents to the emergency department for evaluation after a fall.  Patient brought in by EMS.  Patient reportedly got up to go to the bathroom and fell.  He does not remember exactly what happened.  He is not sure if he passed out or simply tripped.  He is on a blood thinner.  He is complaining of pain in the left posterior hip area as well as the left elbow.      Past Medical History:  Diagnosis Date  . A-fib (HCC)    a. chronic coumadin  . Anxiety   . Atrial flutter ([place])    a. 01/2011 s/p unsuccessful RFCA complicated by CHB req PPM.  . Bladder cancer ([place])   . Bladder mass 11/17/2015   Urothelial carcinoma until proven otherwise.  [name] Kitchen BPH (benign prostatic hyperplasia)    a. elevated PSA  . CAD (coronary artery disease)    a. 1991 s/p CABGx4;  b. redo CABG in 2007 (SVG->OM, VG->PDA->PLV);  c. 01/2011 Cath: LM 80ost, LAD 100p, LCX 100ost, OM1 100, RCA 100, LIMA->LAD ok, VG->PDA ok, VG->OM ok;  d. 03/2012 MV: EF 32% inf & inflat scar w/o ischemia; e. Lexi MV: EF 33%, inf infarct, no ischemia. f. lexiscan 06/12/2014 RCA scar, no ischemia, EF 26%  . CHB (complete heart block) ([place])    a. 01/2011: in setting of RFCA, s/p SJM 2110 Accent DC PPM, ser # [id number].  [name] Kitchen Chronic systolic CHF (congestive heart failure), NYHA class 2 ([place])    a. 06/2012 Echo: EF 25%, distal sept and apical AK, sev dil LV, mild LVH, mod dil LA.  . Depression    "since [id number]" (08/29/2012)  . Elevated PSA, less than 10 ng/ml   . Fibromyalgia   . GERD (gastroesophageal reflux disease)   . Hiatal hernia    a. 03/01/2013 EGD and esoph dil.  [name] Kitchen Hyperlipidemia   . Hypertension   . Hyponatremia 11/17/2015   Water intoxication versus SIADH.  . Ischemic cardiomyopathy    a. 06/2012  EF 25% by echo.  30% 2016.   [name] Kitchen Kidney cysts    a. bilateral  . neuroendocrine   . Pacemaker    explanted 06/15/15 >>> CRT-D STJ device, Dr. Caryl Comes  . Panic attacks   . Skin cancer    'above left eyebrow; tip of my nose" (08/29/2012)    Patient Active Problem List   Diagnosis Date Noted  . 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 Malignant neoplasm of anterior wall of bladder ([place]) 02/03/2016  . 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 Constipation 12/21/2015  . Elevated PSA 11/17/2015  . Chronic systolic congestive heart failure ([place]) 06/15/2015  . Bilateral calf pain 03/11/2015  . Neck pain 03/11/2015  . Benign non-nodular prostatic hyperplasia with lower urinary tract symptoms 05/12/2014  . Claudication of gluteal region ([place]) 01/10/2014  . Ventral hernia 01/23/2013  . Angina pectoris ([place]) 08/30/2012  . Chronic anticoagulation   . Umbilical hernia 77/93/9030  . CAD (coronary artery disease) 10/20/2011  . Atrial fibrillation-Permanent 05/31/2011  . Complete heart block ([place]) 05/31/2011  . Biventricular   defibrillator St Jude  05/31/2011  . Hx of CABG 09/23/2010  . Ischemic cardiomyopathy   .  Hypertension   . Hyperlipidemia   . GERD (gastroesophageal reflux disease)   . Anxiety   . Fibromyalgia     Past Surgical History:  Procedure Laterality Date  . ATRIAL FLUTTER ABLATION  01/2011   "didn't work" (08/29/2012)  . CARDIAC CATHETERIZATION  10/30/2009   Grafts patent. Mild to moderate LV dysfunction. EF 45%. Managed medically.   [name] Kitchen CARDIAC CATHETERIZATION     multiple  . CATARACT EXTRACTION W/ INTRAOCULAR LENS  IMPLANT, BILATERAL  2012  . CHOLECYSTECTOMY  2007  . CORONARY ARTERY BYPASS GRAFT  08/27/1989   CABG X 5  . CORONARY ARTERY BYPASS GRAFT  06/2005   CABG X 3  . EXCISIONAL HEMORRHOIDECTOMY  ~ 1980  . [place]   "growth on the bone taken off; not cancer" (08/29/2012)  . INGUINAL HERNIA REPAIR  1960's   "left I think"    . INSERT / REPLACE / REMOVE PACEMAKER  01/2011   initial placement  . SKIN CANCER EXCISION     'above left eyebrow; tip of my nose" (08/29/2012)  . TONSILLECTOMY  1960       Home Medications    Prior to Admission medications   Medication Sig Start Date End Date Taking? Authorizing Provider  acetaminophen (TYLENOL) 500 MG tablet Take 500 mg by mouth every 8 (eight) hours as needed for mild pain.     [provider]  albuterol (PROVENTIL HFA;VENTOLIN HFA) 108 (90 Base) MCG/ACT inhaler Inhale 2 puffs into the lungs every 6 (six) hours as needed for wheezing or shortness of breath. 09/03/16   Sharion Balloon, FNP  ALPRAZolam Duanne Moron) 1 MG tablet TAKE ONE TABLET 3 TIMES A DAY AS NEEDED. 07/01/16   Eustaquio Maize, MD  carvedilol (COREG) 6.25 MG tablet Take 1 tablet (6.25 mg total) by mouth 2 (two) times daily. 06/08/16   Minus Breeding, MD  doxepin (SINEQUAN) 25 MG capsule Take 25 mg (1 capsule) by mouth in the morning and 50 mg (2 capsules) by mouth at night. 02/11/17   Eustaquio Maize, MD  fluconazole (DIFLUCAN) 150 MG tablet Take 1 tablet (150 mg total) every three (3) days as needed by mouth. 03/09/17   Sharion Balloon, FNP  fluticasone (FLONASE) 50 MCG/ACT nasal spray Place 2 sprays into both nostrils daily. 04/30/15   Sharion Balloon, FNP  furosemide (LASIX) 20 MG tablet TAKE 1 TABLET DAILY AS NEEDED FOR SWELLING 11/16/16   Eustaquio Maize, MD  gabapentin (NEURONTIN) 100 MG capsule Take 100 mg by mouth 4 (four) times daily.    [provider]  guaiFENesin (MUCINEX) 600 MG 12 hr tablet Take 600 mg by mouth 2 (two) times daily as needed for cough or to loosen phlegm. Reported on 11/12/2015    [provider]  hydroxypropyl methylcellulose / hypromellose (ISOPTO TEARS / GONIOVISC) 2.5 % ophthalmic solution Place 1 drop into both eyes 3 (three) times daily as needed for dry eyes.    [provider]  isosorbide mononitrate (IMDUR) 60 MG 24 hr tablet Take 1 tablet  (60 mg total) by mouth daily. SCHEDULE APPT FOR FUTURE REFILLS 08/09/16   Minus Breeding, MD  JANTOVEN 5 MG tablet Take as directed 11/23/16   Eustaquio Maize, MD  lisinopril (PRINIVIL,ZESTRIL) 5 MG tablet Take 1 tablet (5 mg total) by mouth every other day. 11/13/15   Minus Breeding, MD  loratadine (CLARITIN) 10 MG tablet Take 10 mg by mouth daily.    [provider]  methocarbamol (ROBAXIN) 500 MG tablet Take 1 tablet (500 mg total) by mouth every 8 (eight) hours as needed for muscle spasms. 08/25/16   Eustaquio Maize, MD  nitroGLYCERIN (NITROSTAT) 0.4 MG SL tablet DISSOLVE ONE TABLET UNDER THE TONGUE EVERY 5 MINUTES AS NEEDED FOR CHEST PAIN.  DO NOT EXCEED A TOTAL OF 3 DOSES IN 15 MINUTES 07/01/16   Eustaquio Maize, MD  nystatin (MYCOSTATIN/NYSTOP) powder Apply 4 (four) times daily topically. 03/09/17   Evelina Dun A, FNP  nystatin ointment (MYCOSTATIN) Apply 2 (two) times daily topically. 03/09/17   Sharion Balloon, FNP  omeprazole (PRILOSEC) 20 MG capsule Take 1 capsule (20 mg total) by mouth 2 (two) times daily. 03/25/13   Darlin Coco, MD  polyethylene glycol (MIRALAX / GLYCOLAX) packet Take 17 g by mouth daily.     [provider]  rosuvastatin (CRESTOR) 10 MG tablet Take 1 tablet (10 mg total) by mouth daily. 06/08/16   Minus Breeding, MD  Spacer/Aero Chamber Mouthpiece MISC 1 each by Does not apply route every 6 (six) hours as needed. 09/07/16   Eustaquio Maize, MD    Family History Family History  Problem Relation Age of Onset  . Stroke Mother   . Heart attack Father        died @ 75, mult MI's.  [name] Kitchen Heart disease Father   . Hypertension Father   . Diabetes Sister     Social History Social History   Tobacco Use  . Smoking status: Former Smoker    Packs/day: 0.00    Years: 0.00    Pack years: 0.00    Types: Cigarettes    Last attempt to quit: 09/21/1973    Years since quitting: 43.5  . Smokeless tobacco: Never Used  Substance Use Topics  . Alcohol  use: No  . Drug use: No     Allergies   Hydrocodone-acetaminophen; Levofloxacin; Prednisone; Sulfa drugs cross reactors; Ultram [tramadol hcl]; Xarelto [rivaroxaban]; and Zithromax [azithromycin]   Review of Systems Review of Systems  Musculoskeletal: Negative for back pain and neck pain.  Neurological: Positive for dizziness.  All other systems reviewed and are negative.    Physical Exam Updated Vital Signs BP 98/64 (BP Location: Left Arm)   Pulse 70   Temp 98.5 F (36.9 C) (Oral)   Resp 18   SpO2 95%   Physical Exam  Constitutional: He is oriented to person, place, and time. He appears well-developed and well-nourished. No distress.  HENT:  Head: Normocephalic and atraumatic.  Right Ear: Hearing normal.  Left Ear: Hearing normal.  Nose: Nose normal.  Mouth/Throat: Oropharynx is clear and moist and mucous membranes are normal.  Eyes: Conjunctivae and EOM are normal. Pupils are equal, round, and reactive to light.  Neck: Normal range of motion. Neck supple.  Cardiovascular: Regular rhythm, S1 normal and S2 normal. Exam reveals no gallop and no friction rub.  No murmur heard. Pulmonary/Chest: Effort normal and breath sounds normal. No respiratory distress. He exhibits no tenderness.  Abdominal: Soft. Normal appearance and bowel sounds are normal. There is no hepatosplenomegaly. There is no tenderness. There is no rebound, no guarding, no tenderness at McBurney's point and negative Murphy's sign. No hernia.  Musculoskeletal: Normal range of motion.       Left elbow: He exhibits normal range of motion, no swelling and no deformity.       Left hip: He exhibits tenderness (posterior). He exhibits normal range of motion.  Cervical back: Normal.       Thoracic back: Normal.       Lumbar back: Normal.  Abrasion left elbow with no evidence of deformity, normal range of motion  Tenderness of the posterior hip and pelvic area with normal range of motion of the left hip    Neurological: He is alert and oriented to person, place, and time. He has normal strength. No cranial nerve deficit or sensory deficit. Coordination normal. GCS eye subscore is 4. GCS verbal subscore is 5. GCS motor subscore is 6.  Skin: Skin is warm, dry and intact. No rash noted. No cyanosis.  Psychiatric: He has a normal mood and affect. His speech is normal and behavior is normal. Thought content normal.  Nursing note and vitals reviewed.    ED Treatments / Results  Labs (all labs ordered are listed, but only abnormal results are displayed) Labs Reviewed  CBC WITH DIFFERENTIAL/PLATELET - Abnormal; Notable for the following components:      Result Value   RBC 3.69 (*)    Hemoglobin 9.6 (*)    HCT 30.5 (*)    RDW 16.4 (*)    All other components within normal limits  COMPREHENSIVE METABOLIC PANEL - Abnormal; Notable for the following components:   Sodium 128 (*)    Chloride 93 (*)    Glucose, Bld 105 (*)    BUN 36 (*)    Creatinine, Ser 1.57 (*)    Total Bilirubin 1.9 (*)    GFR calc non Af Amer 41 (*)    GFR calc Af Amer 48 (*)    All other components within normal limits  PROTIME-INR - Abnormal; Notable for the following components:   Prothrombin Time 16.3 (*)    All other components within normal limits  URINALYSIS, ROUTINE W REFLEX MICROSCOPIC - Abnormal; Notable for the following components:   Color, Urine BROWN (*)    APPearance TURBID (*)    Hgb urine dipstick LARGE (*)    Bilirubin Urine MODERATE (*)    Ketones, ur 15 (*)    Protein, ur >300 (*)    Nitrite POSITIVE (*)    Leukocytes, UA LARGE (*)    All other components within normal limits  URINALYSIS, MICROSCOPIC (REFLEX) - Abnormal; Notable for the following components:   Bacteria, UA MANY (*)    All other components within normal limits  URINE CULTURE  I-STAT CG4 LACTIC ACID, ED  I-STAT TROPONIN, ED  POC OCCULT BLOOD, ED    EKG  EKG Interpretation  Date/Time:  Tuesday March 14 2017 05:50:58  EST Ventricular Rate:  73 PR Interval:    QRS Duration: 184 QT Interval:  450 QTC Calculation: 496 R Axis:   -83 Text Interpretation:  Ventricular-paced complexes No further analysis attempted due to paced rhythm Baseline wander in lead(s) II III aVF Confirmed by Orpah Greek ([id number]) on 03/14/2017 6:20:14 AM       Radiology Dg Chest 1 View  Result Date: 03/14/2017 CLINICAL DATA:  Fall.  Pain. EXAM: CHEST 1 VIEW COMPARISON:  09/07/2016 . FINDINGS: AICD in stable position. Prior CABG. Heart size stable. Low lung volume with mild basilar atelectasis. No pleural effusion or pneumothorax. No acute bony abnormality identified. IMPRESSION: 1. AICD in stable position.  Prior CABG. 2.  Low lung volumes with mild basilar atelectasis. 3. No acute bony abnormality identified . Electronically Signed   By: Marcello Moores  Register   On: 03/14/2017 07:07   Dg Elbow Complete Left  Result Date:  03/14/2017 CLINICAL DATA:  Fall.  Pain left elbow . EXAM: LEFT ELBOW - COMPLETE 3+ VIEW COMPARISON:  No recent. FINDINGS: Diffuse soft tissue swelling. Vascular calcification noted . Subtle fracture of the tip of the coronoid process cannot be excluded P IMPRESSION: Diffuse soft tissue swelling. Subtle fracture of the tip of the choroid process cannot be excluded. Electronically Signed   By: Marcello Moores  Register   On: 03/14/2017 07:10   Ct Head Wo Contrast  Result Date: 03/14/2017 CLINICAL DATA:  76 year old male post fall. On anticoagulation. Initial encounter. EXAM: CT HEAD WITHOUT CONTRAST TECHNIQUE: Contiguous axial images were obtained from the base of the skull through the vertex without intravenous contrast. COMPARISON:  09/10/2016 CT. FINDINGS: Brain: No intracranial hemorrhage or CT evidence of large acute infarct. Mild atrophy, typical of age. No intracranial mass lesion noted on this unenhanced exam. Vascular: Vascular calcifications Skull: No skull fracture. Sinuses/Orbits: No acute orbital abnormality.  Visualized paranasal sinuses are clear. Other: Mastoid air cells and middle ear cavities are clear. IMPRESSION: No skull fracture or intracranial hemorrhage. No CT evidence of large acute infarct. Electronically Signed   By: Genia Del M.D.   On: 03/14/2017 07:17   Dg Hip Unilat W Or Wo Pelvis 2-3 Views Left  Result Date: 03/14/2017 CLINICAL DATA:  Fall.  Left hip pain. EXAM: DG HIP (WITH OR WITHOUT PELVIS) 2-3V LEFT COMPARISON:  No recent. FINDINGS: Diffuse osteopenia and degenerative change. No acute bony or joint abnormality identified. No evidence of fracture or dislocation. Aortoiliac and peripheral atherosclerotic vascular calcification. IMPRESSION: 1.  Diffuse osteopenia degenerative change.  No acute abnormality . 2.  Aortoiliac and peripheral vascular disease. Electronically Signed   By: Marcello Moores  Register   On: 03/14/2017 07:01    Procedures Procedures (including critical care time)  Medications Ordered in ED Medications  sodium chloride 0.9 % bolus 500 mL (0 mLs Intravenous Stopped 03/14/17 0714)     Initial Impression / Assessment and Plan / ED Course  I have reviewed the triage vital signs and the nursing notes.  Pertinent labs & imaging results that were available during my care of the patient were reviewed by me and considered in my medical decision making (see chart for details).     Patient presents to the emergency department for evaluation of a fall.  Patient got up in the middle of the night and fell.  He is not sure exactly what caused the fall.  Patient was slightly hypotensive at arrival, however.  I suspect he was orthostatic.  His lab work does suggest dehydration.  He has had a good response to small fluid bolus.  The blood work is unremarkable other than elevated BUN and creatinine.  He did have mild anemia, below his baseline, but his Hemoccult was negative.  No source of bleeding.  This is likely secondary to chronic disease and his ongoing illness.  He might be  having some ongoing blood loss into his urinary system secondary to his cancer.  Patient underwent x-ray of chest to workup weakness, dizziness, possible syncope.  No acute process seen.  He is complaining of left hip pain.  Examination reveals soft tissue tenderness in the gluteal area, not actually his hip.  X-ray of left hip was negative.  He has normal range of motion of the hip, no concern for occult fracture.  X-ray of elbow does show soft tissue swelling in the cannot rule out a subtle chip fracture off of the coracoid process.  He has normal range  of motion, I believe immobilizing this would cause him more hardship and causing more likelihood of falling.  No intervention necessary for this, as he is currently a hospice patient. CT head performed due to head injury on anticoagulation. CT negative.  Patient received a 500 mL fluid bolus.  Orthostatics are normal.  Patient had a grossly abnormal urinalysis. This is consistent with previous UA and similar urines ultimately yielded negative cultures. Will await culture, suspect this is chronic abnormality due to instrumentation, possible colonization.   Final Clinical Impressions(s) / ED Diagnoses   Final diagnoses:  Injury of head, initial encounter  Contusion of pelvic region, initial encounter  Contusion of left elbow, initial encounter  Dehydration    ED Discharge Orders    None       Orpah Greek, MD 03/14/17 [id number]    Orpah Greek, MD 03/14/17 [id number]

## 2017-03-14 NOTE — ED Triage Notes (Signed)
Pt brought in by [facility] for c/o fall; pt states he was getting up to go to bathroom and doesn't remember what happened after that; pt is c/o left hip, elbow and knee pain; pt has an abrasion to left knee and left elbow; pt doesn't know if he passed out or tripped

## 2017-03-14 NOTE — ED Notes (Signed)
Pt transported to CT/Xray. 

## 2017-03-14 NOTE — ED Notes (Signed)
ED Provider at bedside. 

## 2017-03-15 LAB — URINE CULTURE

## 2017-05-02 DEATH — deceased

## 2017-05-31 ENCOUNTER — Ambulatory Visit (INDEPENDENT_AMBULATORY_CARE_PROVIDER_SITE_OTHER): Admitting: Ophthalmology

## 2017-06-02 DEATH — deceased

## 2018-11-03 IMAGING — DX DG ELBOW COMPLETE 3+V*L*
4 series · 4 of 4 positions shown · non-contrast
Comparison: No recent.

CLINICAL DATA: Fall.  Pain left elbow .

EXAM:
LEFT ELBOW - COMPLETE 3+ VIEW

[elbow ap]
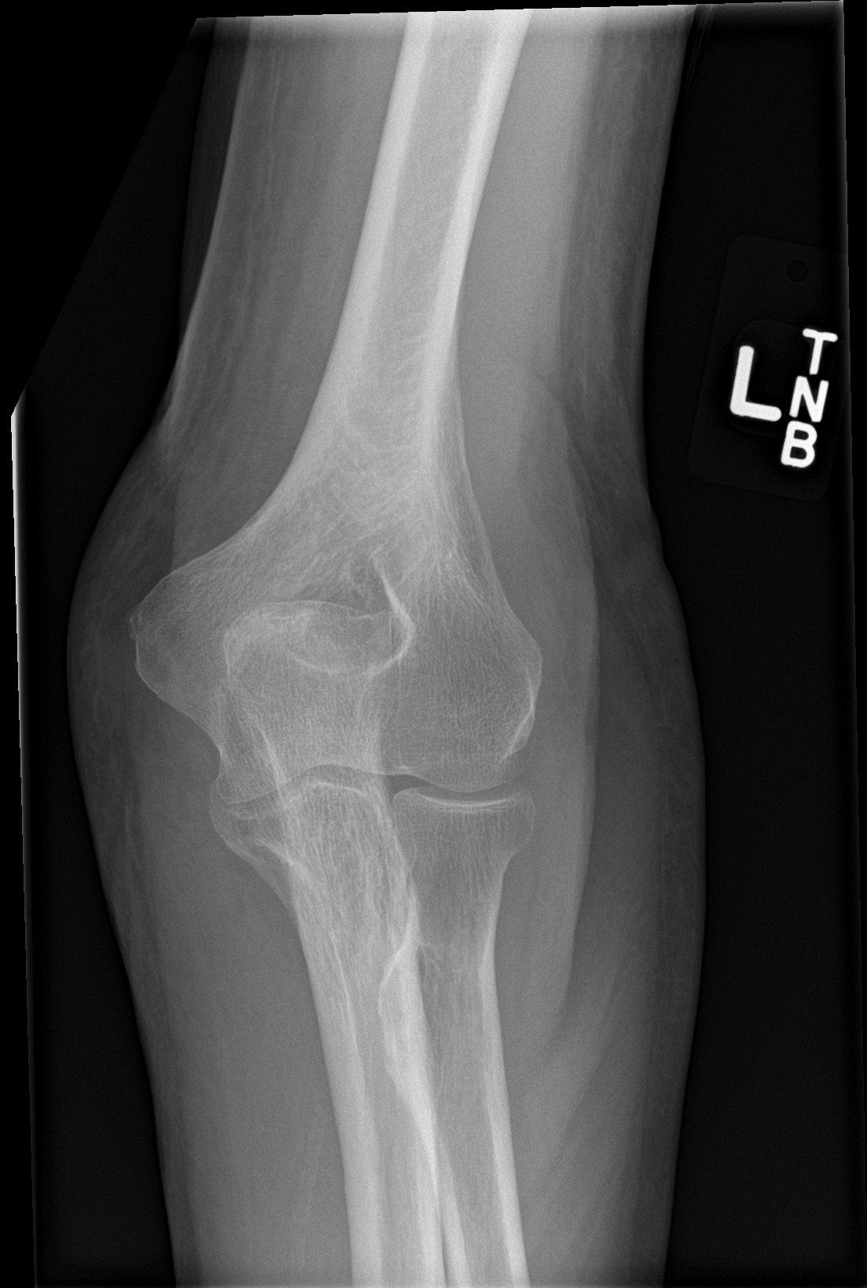

[elbow obl (1 of 2)]
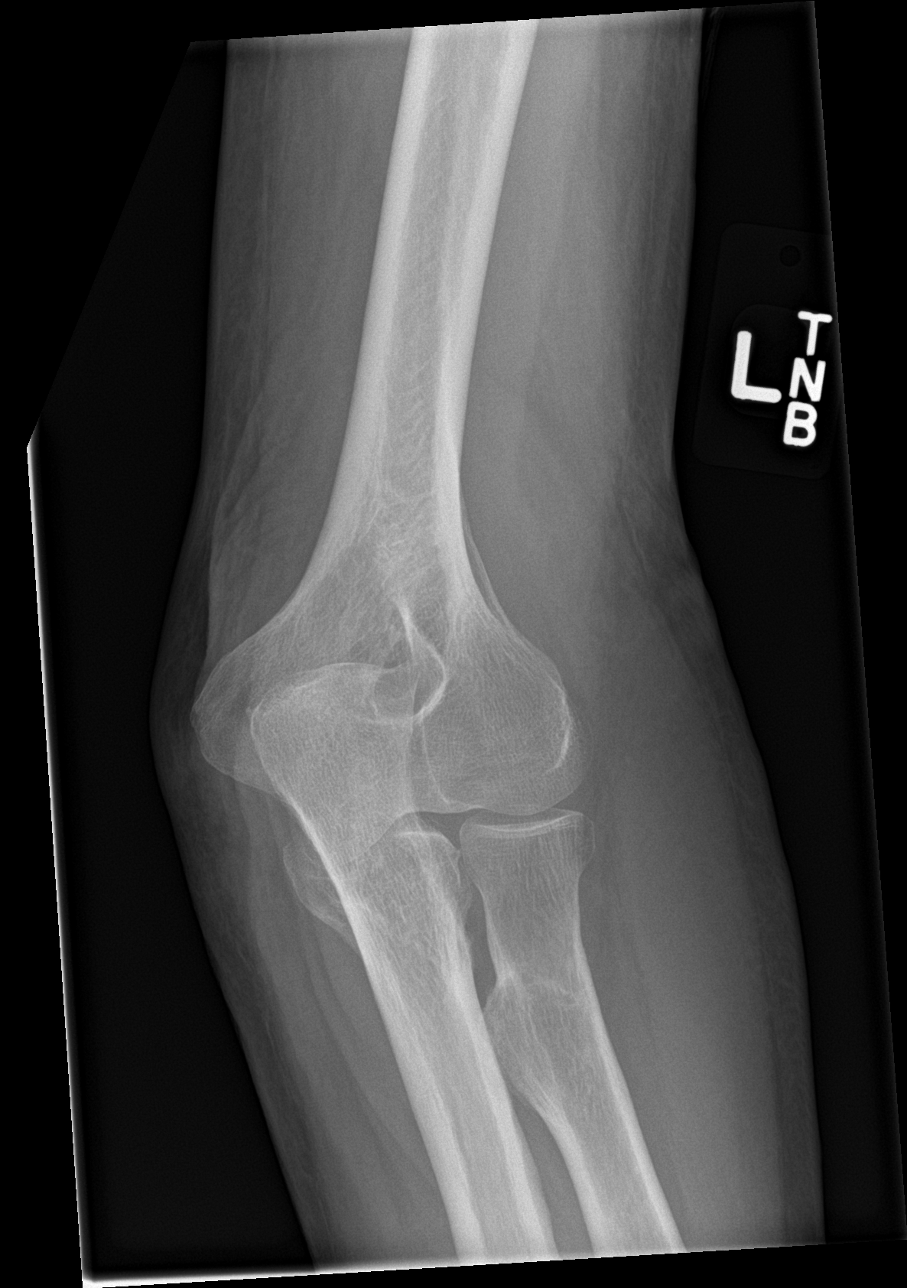

[elbow obl (2 of 2)]
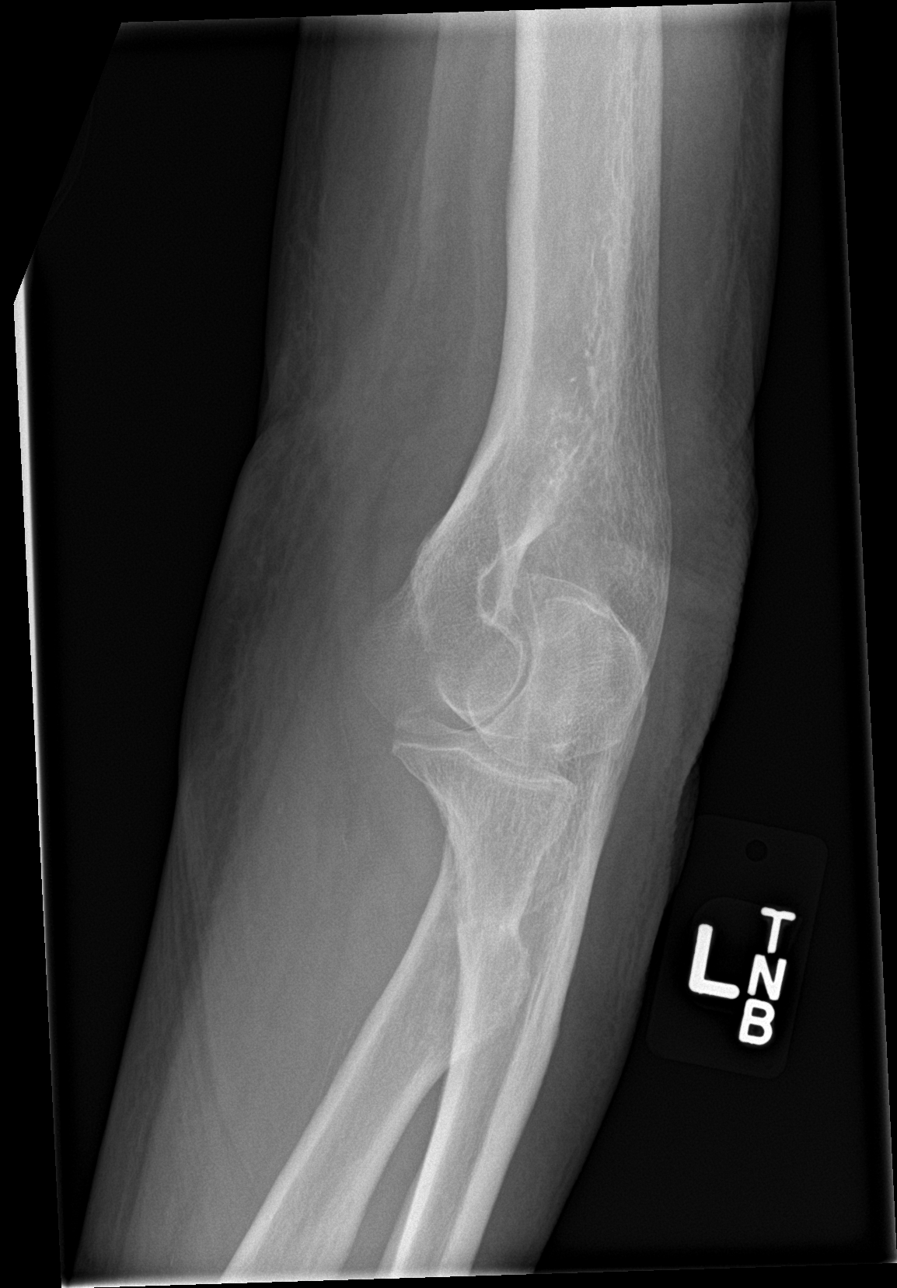

[elbow lat]
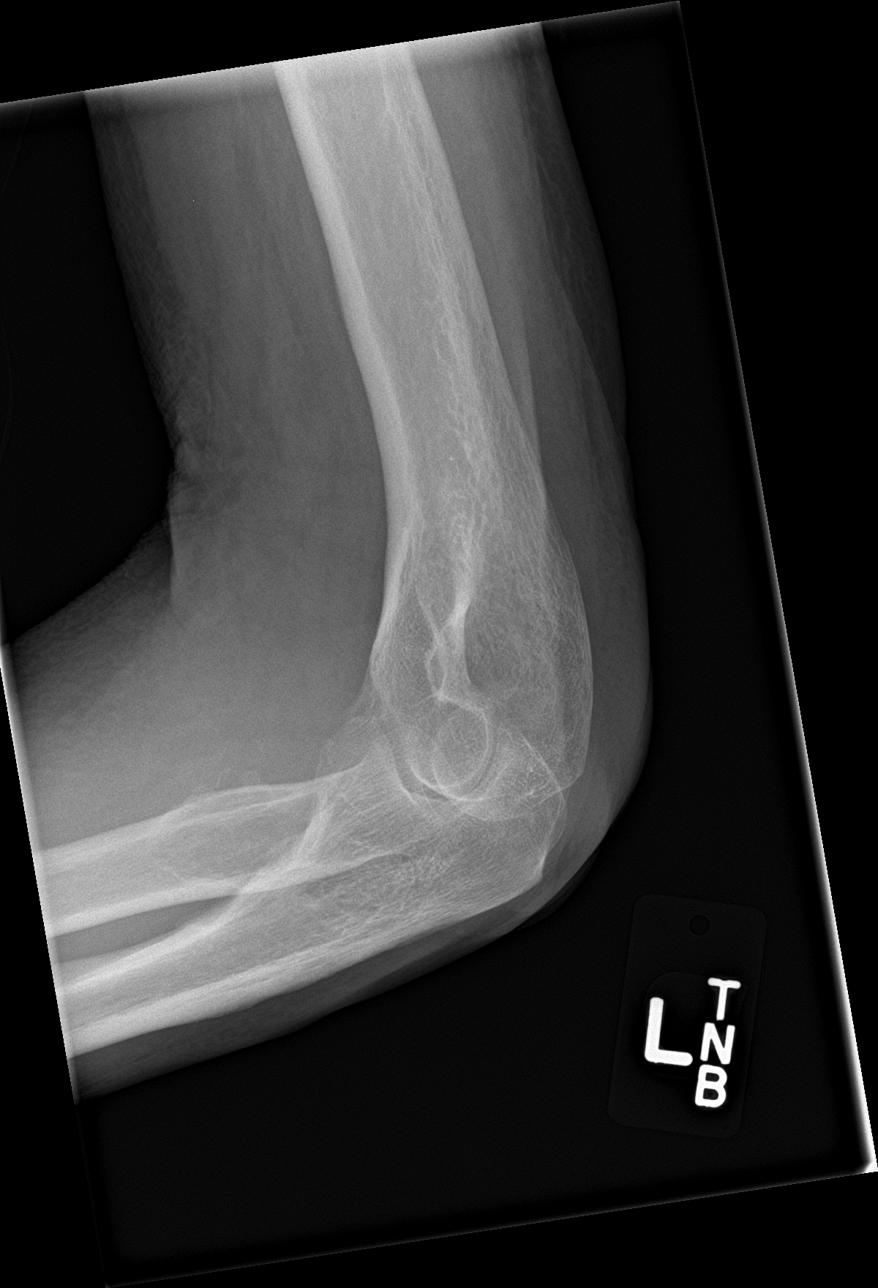

[4 of 4 positions shown; findings below may reference images not displayed]

FINDINGS: Diffuse soft tissue swelling. Vascular calcification noted . Subtle
fracture of the tip of the coronoid process cannot be excluded P
IMPRESSION: Diffuse soft tissue swelling. Subtle fracture of the tip of the
choroid process cannot be excluded.
